# Patient Record
Sex: Female | Born: 1935
Health system: Southern US, Community
[De-identification: ages and names within clinical notes are randomized; demographics above are authoritative.]

## PROBLEM LIST (undated history)

## (undated) DIAGNOSIS — F0390 Unspecified dementia without behavioral disturbance: Secondary | ICD-10-CM

## (undated) DIAGNOSIS — I4891 Unspecified atrial fibrillation: Secondary | ICD-10-CM

## (undated) DIAGNOSIS — I272 Pulmonary hypertension, unspecified: Secondary | ICD-10-CM

## (undated) DIAGNOSIS — Z8709 Personal history of other diseases of the respiratory system: Secondary | ICD-10-CM

## (undated) DIAGNOSIS — R140 Abdominal distension (gaseous): Secondary | ICD-10-CM

## (undated) DIAGNOSIS — I050 Rheumatic mitral stenosis: Secondary | ICD-10-CM

## (undated) DIAGNOSIS — I5033 Acute on chronic diastolic (congestive) heart failure: Secondary | ICD-10-CM

## (undated) DIAGNOSIS — I10 Essential (primary) hypertension: Secondary | ICD-10-CM

## (undated) DIAGNOSIS — K219 Gastro-esophageal reflux disease without esophagitis: Secondary | ICD-10-CM

## (undated) DIAGNOSIS — K580 Irritable bowel syndrome with diarrhea: Secondary | ICD-10-CM

## (undated) DIAGNOSIS — D509 Iron deficiency anemia, unspecified: Secondary | ICD-10-CM

## (undated) DIAGNOSIS — R197 Diarrhea, unspecified: Secondary | ICD-10-CM

## (undated) DIAGNOSIS — I5032 Chronic diastolic (congestive) heart failure: Secondary | ICD-10-CM

## (undated) DIAGNOSIS — F419 Anxiety disorder, unspecified: Secondary | ICD-10-CM

## (undated) DIAGNOSIS — M26602 Left temporomandibular joint disorder, unspecified: Secondary | ICD-10-CM

## (undated) DIAGNOSIS — G8929 Other chronic pain: Secondary | ICD-10-CM

## (undated) DIAGNOSIS — M549 Dorsalgia, unspecified: Secondary | ICD-10-CM

## (undated) DIAGNOSIS — J189 Pneumonia, unspecified organism: Secondary | ICD-10-CM

## (undated) DIAGNOSIS — R1310 Dysphagia, unspecified: Secondary | ICD-10-CM

## (undated) DIAGNOSIS — M810 Age-related osteoporosis without current pathological fracture: Secondary | ICD-10-CM

## (undated) HISTORY — DX: Essential (primary) hypertension: I10

## (undated) HISTORY — PX: TONSILLECTOMY: SUR1361

## (undated) HISTORY — DX: Unspecified atrial fibrillation: I48.91

## (undated) HISTORY — DX: Left temporomandibular joint disorder, unspecified: M26.602

## (undated) HISTORY — DX: Iron deficiency anemia, unspecified: D50.9

## (undated) HISTORY — DX: Irritable bowel syndrome with diarrhea: K58.0

## (undated) HISTORY — PX: ABDOMINAL HYSTERECTOMY: SHX81

## (undated) HISTORY — PX: BACK SURGERY: SHX140

## (undated) HISTORY — DX: Unspecified dementia, unspecified severity, without behavioral disturbance, psychotic disturbance, mood disturbance, and anxiety: F03.90

## (undated) HISTORY — DX: Anxiety disorder, unspecified: F41.9

## (undated) HISTORY — DX: Personal history of other diseases of the respiratory system: Z87.09

## (undated) HISTORY — DX: Age-related osteoporosis without current pathological fracture: M81.0

## (undated) HISTORY — DX: Pneumonia, unspecified organism: J18.9

## (undated) HISTORY — DX: Dysphagia, unspecified: R13.10

---
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
## 2002-03-25 ENCOUNTER — Ambulatory Visit (HOSPITAL_COMMUNITY): Admission: RE | Admit: 2002-03-25 | Discharge: 2002-03-25 | Payer: Self-pay | Admitting: Pulmonary Disease

## 2007-05-21 ENCOUNTER — Ambulatory Visit (HOSPITAL_COMMUNITY): Admission: RE | Admit: 2007-05-21 | Discharge: 2007-05-21 | Payer: Self-pay | Admitting: Ophthalmology

## 2007-06-18 ENCOUNTER — Ambulatory Visit (HOSPITAL_COMMUNITY): Admission: RE | Admit: 2007-06-18 | Discharge: 2007-06-18 | Payer: Self-pay | Admitting: Ophthalmology

## 2008-04-07 ENCOUNTER — Ambulatory Visit (HOSPITAL_COMMUNITY): Admission: RE | Admit: 2008-04-07 | Discharge: 2008-04-07 | Payer: Self-pay | Admitting: Pulmonary Disease

## 2008-10-30 ENCOUNTER — Ambulatory Visit (HOSPITAL_COMMUNITY): Admission: RE | Admit: 2008-10-30 | Discharge: 2008-10-30 | Payer: Self-pay | Admitting: Pulmonary Disease

## 2008-11-25 ENCOUNTER — Ambulatory Visit (HOSPITAL_COMMUNITY): Admission: RE | Admit: 2008-11-25 | Discharge: 2008-11-25 | Payer: Self-pay | Admitting: Pulmonary Disease

## 2009-09-21 ENCOUNTER — Ambulatory Visit (HOSPITAL_COMMUNITY)
Admission: RE | Admit: 2009-09-21 | Discharge: 2009-09-21 | Payer: Self-pay | Source: Home / Self Care | Admitting: Pulmonary Disease

## 2010-07-12 ENCOUNTER — Ambulatory Visit (HOSPITAL_COMMUNITY)
Admission: RE | Admit: 2010-07-12 | Discharge: 2010-07-12 | Payer: Self-pay | Source: Home / Self Care | Attending: Pulmonary Disease | Admitting: Pulmonary Disease

## 2010-07-19 ENCOUNTER — Ambulatory Visit (HOSPITAL_COMMUNITY)
Admission: RE | Admit: 2010-07-19 | Discharge: 2010-07-19 | Payer: Self-pay | Source: Home / Self Care | Attending: Pulmonary Disease | Admitting: Pulmonary Disease

## 2010-08-03 ENCOUNTER — Ambulatory Visit (HOSPITAL_COMMUNITY)
Admission: RE | Admit: 2010-08-03 | Discharge: 2010-08-03 | Payer: Self-pay | Source: Home / Self Care | Attending: Pulmonary Disease | Admitting: Pulmonary Disease

## 2010-09-21 ENCOUNTER — Encounter (HOSPITAL_COMMUNITY)
Admission: RE | Admit: 2010-09-21 | Discharge: 2010-09-21 | Disposition: A | Discharge status: Home / Self Care | Payer: Medicare Other | Source: Ambulatory Visit | Attending: Neurosurgery | Admitting: Neurosurgery

## 2010-09-21 DIAGNOSIS — Z01812 Encounter for preprocedural laboratory examination: Secondary | ICD-10-CM | POA: Insufficient documentation

## 2010-09-21 LAB — SURGICAL PCR SCREEN: MRSA, PCR: NEGATIVE

## 2010-09-21 LAB — DIFFERENTIAL
Eosinophils Absolute: 0.1 10*3/uL (ref 0.0–0.7)
Eosinophils Relative: 1 % (ref 0–5)
Lymphocytes Relative: 24 % (ref 12–46)
Monocytes Absolute: 0.8 10*3/uL (ref 0.1–1.0)
Neutrophils Relative %: 68 % (ref 43–77)

## 2010-09-21 LAB — CBC
HCT: 38.4 % (ref 36.0–46.0)
Platelets: 217 10*3/uL (ref 150–400)
RBC: 4.58 MIL/uL (ref 3.87–5.11)
RDW: 13.2 % (ref 11.5–15.5)
WBC: 10.9 10*3/uL — ABNORMAL HIGH (ref 4.0–10.5)

## 2010-09-27 ENCOUNTER — Inpatient Hospital Stay (HOSPITAL_COMMUNITY): Payer: Medicare Other

## 2010-09-27 ENCOUNTER — Observation Stay (HOSPITAL_COMMUNITY)
Admission: RE | Admit: 2010-09-27 | Discharge: 2010-09-28 | Disposition: A | Discharge status: Home / Self Care | Payer: Medicare Other | Source: Ambulatory Visit | Attending: Neurosurgery | Admitting: Neurosurgery

## 2010-09-27 DIAGNOSIS — Z01812 Encounter for preprocedural laboratory examination: Secondary | ICD-10-CM | POA: Insufficient documentation

## 2010-09-27 DIAGNOSIS — K219 Gastro-esophageal reflux disease without esophagitis: Secondary | ICD-10-CM | POA: Insufficient documentation

## 2010-09-27 DIAGNOSIS — M48062 Spinal stenosis, lumbar region with neurogenic claudication: Principal | ICD-10-CM | POA: Insufficient documentation

## 2010-09-27 LAB — TYPE AND SCREEN: ABO/RH(D): B POS

## 2010-10-05 NOTE — Op Note (Signed)
  NAME:  Kristy Mckinney, Kristy Mckinney             ACCOUNT NO.:  [id number]  MEDICAL RECORD NO.:  [id number]           PATIENT TYPE:  O  LOCATION:  3528                         FACILITY:  [facility]  PHYSICIAN:  Eilene Voigt A. Cleve Paolillo, M.D.    DATE OF BIRTH:  04-18-1932  DATE OF PROCEDURE:  09/27/2010 DATE OF DISCHARGE:                              OPERATIVE REPORT   PREOPERATIVE DIAGNOSIS:  Lumbar stenosis.  POSTOPERATIVE DIAGNOSIS:  Lumbar stenosis.  PROCEDURE NOTE:  L3, L4, and L5 decompressive laminectomies with bilateral L3, L4, and L5 decompressive foraminotomies.  SURGEON:  Kathaleen Maser. Blia Totman, MD  ANESTHESIA:  General endotracheal.  INDICATION:  [name] is a 77 year old female with history of severe neurogenic claudication failing conservative management.  Workup demonstrates evidence of critical stenosis at L3-4 and L4-5.  The patient presents now for lumbar decompression in hopes of improving her symptoms.  OPERATIVE NOTE:  The patient placed on the operative table in supine position.  After an adequate level of anesthesia achieved, the patient was placed prone onto Wilson frame.  Appropriately padded the patient's lumbar region, prepped and draped sterilely.  A 10 blade was used to make a curvilinear skin incision overlying the L3, 4, 5 levels.  This was carried down sharply in midline. Subperiosteal dissection was then performed exposing the lamina and facet joints L3, L4, and L5.  Deep self-retaining retractor was placed.  Intraoperative x-rays taken, and the level was confirmed.  Decompressive laminectomy was then performed using Leksell rongeurs, Kerrison rongeurs, and high-speed drill to remove the entire lamina of L4, inferior three-quarters of lamina of L3, superior one-third of lamina of L5.  Medial facetectomies were performed bilaterally.  Ligamentum flavum was elevated and resected in usual fashion.  Decompressive foraminotomies were then performed along the course of the exiting  L3, L4, and L5 nerve roots by undercutting the lateral gutters bilaterally.  At this point, a very thorough decompression was achieved.  There was no injury to thecal sac or nerve roots.  Wound was then irrigated with antibiotic solution.  Gelfoam was placed topically for hemostasis and found be good.  A medium Hemovac drain was left in the interspace.  Wounds were then closed in layers with Vicryl suture. Steri-Strips and sterile dressing were applied.  There were no complications.  The patient tolerated the procedure well and she returned to recovery room postoperatively.          ______________________________ Kathaleen Maser [name], M.D.     HAP/MEDQ  D:  09/27/2010  T:  09/28/2010  Job:  [id number]  Electronically Signed by Julio Sicks M.D. on 10/05/2010 04:06:26 PM

## 2011-04-18 LAB — BASIC METABOLIC PANEL
BUN: 17
CO2: 29
Chloride: 103
Creatinine, Ser: 0.67

## 2011-10-02 ENCOUNTER — Encounter (HOSPITAL_COMMUNITY): Payer: Self-pay | Admitting: Pharmacy Technician

## 2011-10-10 ENCOUNTER — Encounter (HOSPITAL_COMMUNITY): Admission: RE | Admit: 2011-10-10 | Payer: Medicare Other | Source: Ambulatory Visit

## 2011-10-17 ENCOUNTER — Encounter (HOSPITAL_COMMUNITY): Admission: RE | Payer: Self-pay | Source: Ambulatory Visit

## 2011-10-17 ENCOUNTER — Ambulatory Visit (HOSPITAL_COMMUNITY): Admission: RE | Admit: 2011-10-17 | Payer: Medicare Other | Source: Ambulatory Visit | Admitting: Ophthalmology

## 2011-10-17 SURGERY — TREATMENT, USING YAG LASER
Anesthesia: LOCAL | Laterality: Right

## 2011-11-07 ENCOUNTER — Ambulatory Visit (HOSPITAL_COMMUNITY): Admission: RE | Admit: 2011-11-07 | Payer: Medicare Other | Source: Ambulatory Visit | Admitting: Ophthalmology

## 2011-11-07 SURGERY — TREATMENT, USING YAG LASER
Anesthesia: LOCAL | Laterality: Left

## 2011-11-07 NOTE — Discharge Instructions (Signed)
Kristy Mckinney  11/07/2011     Instructions    Activity: No Restrictions.   Diet: Resume Diet you were on at home.   Pain Medication: Tylenol if Needed.   CONTACT YOUR DOCTOR IF YOU HAVE PAIN, REDNESS IN YOUR EYE, OR DECREASED VISION.   Follow-up: today between 2:00- 3:00  with Loraine Leriche T. Nile Riggs, MD.   Dr. Lahoma Crocker: [phone]  Dr. Lita Mains: [phone]  Dr. Alto Denver: [phone]   If you find that you cannot contact your physician, but feel that your signs and   Symptoms warrant a physician's attention, call the Emergency Room at   [phone] [phone].

## 2011-11-07 NOTE — H&P (Signed)
The patient was re examined and there is no change in the patients condition since the original H and P. 

## 2012-01-17 ENCOUNTER — Emergency Department (HOSPITAL_COMMUNITY): Payer: Medicare Other

## 2012-01-17 ENCOUNTER — Encounter (HOSPITAL_COMMUNITY): Payer: Self-pay | Admitting: *Deleted

## 2012-01-17 ENCOUNTER — Emergency Department (HOSPITAL_COMMUNITY)
Admission: EM | Admit: 2012-01-17 | Discharge: 2012-01-17 | Disposition: A | Discharge status: Home / Self Care | Payer: Medicare Other | Attending: Emergency Medicine | Admitting: Emergency Medicine

## 2012-01-17 DIAGNOSIS — W010XXA Fall on same level from slipping, tripping and stumbling without subsequent striking against object, initial encounter: Secondary | ICD-10-CM | POA: Insufficient documentation

## 2012-01-17 DIAGNOSIS — Z79899 Other long term (current) drug therapy: Secondary | ICD-10-CM | POA: Insufficient documentation

## 2012-01-17 DIAGNOSIS — Z23 Encounter for immunization: Secondary | ICD-10-CM | POA: Insufficient documentation

## 2012-01-17 DIAGNOSIS — S62113A Displaced fracture of triquetrum [cuneiform] bone, unspecified wrist, initial encounter for closed fracture: Secondary | ICD-10-CM

## 2012-01-17 DIAGNOSIS — Y92009 Unspecified place in unspecified non-institutional (private) residence as the place of occurrence of the external cause: Secondary | ICD-10-CM | POA: Insufficient documentation

## 2012-01-17 MED ORDER — TETANUS-DIPHTH-ACELL PERTUSSIS 5-2.5-18.5 LF-MCG/0.5 IM SUSP
0.5000 mL | Freq: Once | INTRAMUSCULAR | Status: AC
  Administered 2012-01-17: 0.5 mL via INTRAMUSCULAR
  Filled 2012-01-17: qty 0.5

## 2012-01-17 NOTE — ED Notes (Signed)
Larey Seat this am, Swelling to rt wrist. Good radial pulse.

## 2012-01-17 NOTE — ED Notes (Signed)
Pt states fell this morning & tried to brace herself. Right wrist noted swelling & bruised. Pt w/ good sensation, cap refill, & radial pulse.

## 2012-01-17 NOTE — ED Notes (Signed)
Pt alert & oriented x4, stable gait. Pt given discharge instructions, paperwork & prescription(s). Patient instructed to stop at the registration desk to finish any additional paperwork. pt verbalized understanding. Pt left department w/ no further questions.  

## 2012-01-18 NOTE — ED Provider Notes (Signed)
Medical screening examination/treatment/procedure(s) were conducted as a shared visit with non-physician practitioner(s) and myself.  I personally evaluated the patient during the encounter  Mechanical fall with isolated R wrist pain.  +2 radial pulse. Cardinal hand movements intact.  Glynn Octave, MD 01/18/12 [phone]

## 2012-01-18 NOTE — ED Provider Notes (Signed)
History     CSN: [id number]  Arrival date & time 01/17/12  1845   First MD Initiated Contact with Patient 01/17/12 1902      Chief Complaint  Patient presents with  . Wrist Pain    (Consider location/radiation/quality/duration/timing/severity/associated sxs/prior treatment) HPI Comments: [name] tripped this morning,  Landing on her right outstretched hand.  She has used ice,elevation and hydrocodone (with transient relief) but continues to have aching near constant pain in her right lateral wrist.  She denies weakness or numbness distal to the injury site and also denies any other injury or pain,  Despite also sustaining abrasions on her left knee.  She denies head injury and denies loc or dizziness prior to the event.  She is right handed.    The history is provided by the patient.    History reviewed. No pertinent past medical history.  Past Surgical History  Procedure Date  . Back surgery   . Abdominal hysterectomy   . Tonsillectomy     History reviewed. No pertinent family history.  History  Substance Use Topics  . Smoking status: Never Smoker   . Smokeless tobacco: Not on file  . Alcohol Use: No    OB History    Grav Para Term Preterm Abortions TAB SAB Ect Mult Living                  Review of Systems  Musculoskeletal: Positive for joint swelling and arthralgias.  Skin: Negative for wound.  Neurological: Negative for weakness and numbness.    Allergies  Feldene  Home Medications   Current Outpatient Rx  Name Route Sig Dispense Refill  . ALPRAZOLAM 1 MG PO TABS Oral Take 1 mg by mouth at bedtime.    Marland Kitchen CALCIUM CARBONATE-VITAMIN D 500-200 MG-UNIT PO TABS Oral Take 1 tablet by mouth 2 (two) times daily.     Marland Kitchen HYDROCODONE-ACETAMINOPHEN 10-500 MG PO TABS Oral Take 1 tablet by mouth 2 (two) times daily as needed. Pain    . ADULT MULTIVITAMIN W/MINERALS CH Oral Take 1 tablet by mouth daily.    Marland Kitchen OMEPRAZOLE 20 MG PO CPDR Oral Take 20 mg by mouth  every morning.     Marland Kitchen SYSTANE OP Ophthalmic Apply 1 drop to eye daily as needed. Dry Eyes    . SERTRALINE HCL 50 MG PO TABS Oral Take 50 mg by mouth every morning.       BP 169/81  Pulse 86  Temp 98.6 F (37 C) (Oral)  Resp 20  Ht 5' 5.5" (1.664 m)  Wt 138 lb (62.596 kg)  BMI 22.61 kg/m2  SpO2 99%  Physical Exam  Constitutional: She appears well-developed and well-nourished.  HENT:  Head: Atraumatic.  Neck: Normal range of motion.  Cardiovascular:       Pulses equal bilaterally  Musculoskeletal: She exhibits tenderness.       Right hand: She exhibits bony tenderness. She exhibits normal capillary refill and no deformity. normal sensation noted.       Hands:      Pt can flex and extend right fingers,  Increased wrist pain with attempted resisted extension and flexion of 5th finger.    Neurological: She is alert. She has normal strength. She displays normal reflexes. No sensory deficit.       Equal strength  Skin: Skin is warm and dry.  Psychiatric: She has a normal mood and affect.    ED Course  Procedures (including critical care time)  Labs  Reviewed - No data to display Dg Wrist Complete Right  01/17/2012  *RADIOLOGY REPORT*  Clinical Data: Distal right radius pain and swelling secondary to a fall this morning.  RIGHT WRIST - COMPLETE 3+ VIEW  Comparison: None.  Findings: There is a fracture of the dorsal aspect of the triquetrum with adjacent soft tissue swelling.  No other bones of the wrist are intact.  IMPRESSION: Fracture of the triquetrum.  Original Report Authenticated By: Gwynn Burly, M.D.     1. Triquetral fracture    Pt placed in ulner gutter splint,  Sling by RN.  Examined by me post application.  Pt comfortable,  Less than 2 sec cap refill present.   MDM  Pt with triquetral fx,  Has seen Dr. Hilda Lias in the past,  Advised to call in am for appt for recheck of her injury,  And full cast application this week.  Encouraged ice,  Elevation,  Continue with  hydrocodone prn - pt has prescription.   Pt was seen by Dr. Manus Gunning prior to dc home.  xrays reviewed,  And also shown to pt.        [name], [place] 01/18/12 1438

## 2013-12-15 ENCOUNTER — Other Ambulatory Visit (HOSPITAL_COMMUNITY): Payer: Self-pay | Admitting: Pulmonary Disease

## 2013-12-15 DIAGNOSIS — R109 Unspecified abdominal pain: Secondary | ICD-10-CM

## 2013-12-16 ENCOUNTER — Other Ambulatory Visit (HOSPITAL_COMMUNITY): Payer: Self-pay | Admitting: Pulmonary Disease

## 2013-12-16 ENCOUNTER — Ambulatory Visit (HOSPITAL_COMMUNITY)
Admission: RE | Admit: 2013-12-16 | Discharge: 2013-12-16 | Disposition: A | Discharge status: Home / Self Care | Payer: Medicare HMO | Source: Ambulatory Visit | Attending: Pulmonary Disease | Admitting: Pulmonary Disease

## 2013-12-16 DIAGNOSIS — R109 Unspecified abdominal pain: Secondary | ICD-10-CM

## 2013-12-16 DIAGNOSIS — M545 Low back pain, unspecified: Secondary | ICD-10-CM

## 2013-12-16 DIAGNOSIS — Z4789 Encounter for other orthopedic aftercare: Secondary | ICD-10-CM | POA: Insufficient documentation

## 2013-12-16 DIAGNOSIS — K7689 Other specified diseases of liver: Secondary | ICD-10-CM | POA: Insufficient documentation

## 2013-12-16 DIAGNOSIS — M1610 Unilateral primary osteoarthritis, unspecified hip: Secondary | ICD-10-CM | POA: Insufficient documentation

## 2013-12-16 DIAGNOSIS — M169 Osteoarthritis of hip, unspecified: Secondary | ICD-10-CM | POA: Insufficient documentation

## 2013-12-16 DIAGNOSIS — M25559 Pain in unspecified hip: Secondary | ICD-10-CM

## 2013-12-16 DIAGNOSIS — I7090 Unspecified atherosclerosis: Secondary | ICD-10-CM | POA: Insufficient documentation

## 2013-12-16 DIAGNOSIS — M4120 Other idiopathic scoliosis, site unspecified: Secondary | ICD-10-CM | POA: Insufficient documentation

## 2013-12-16 DIAGNOSIS — K8020 Calculus of gallbladder without cholecystitis without obstruction: Secondary | ICD-10-CM | POA: Insufficient documentation

## 2013-12-16 DIAGNOSIS — M47817 Spondylosis without myelopathy or radiculopathy, lumbosacral region: Secondary | ICD-10-CM | POA: Insufficient documentation

## 2014-01-15 ENCOUNTER — Ambulatory Visit (HOSPITAL_COMMUNITY)
Admission: RE | Admit: 2014-01-15 | Discharge: 2014-01-15 | Disposition: A | Discharge status: Home / Self Care | Payer: Medicare HMO | Source: Ambulatory Visit | Attending: Pulmonary Disease | Admitting: Pulmonary Disease

## 2014-01-15 ENCOUNTER — Other Ambulatory Visit (HOSPITAL_COMMUNITY): Payer: Self-pay | Admitting: Pulmonary Disease

## 2014-01-15 DIAGNOSIS — M25531 Pain in right wrist: Secondary | ICD-10-CM

## 2014-01-15 DIAGNOSIS — S62309A Unspecified fracture of unspecified metacarpal bone, initial encounter for closed fracture: Secondary | ICD-10-CM | POA: Insufficient documentation

## 2014-01-15 DIAGNOSIS — M25539 Pain in unspecified wrist: Secondary | ICD-10-CM | POA: Insufficient documentation

## 2014-01-15 DIAGNOSIS — W19XXXA Unspecified fall, initial encounter: Secondary | ICD-10-CM

## 2014-01-22 NOTE — Patient Instructions (Signed)
Kristy Mckinney  01/22/2014   Your procedure is scheduled on:  01/29/14  Report to [facility] at [facility].  Call this number if you have problems the morning of surgery: [phone]   Remember:   Do not eat food or drink liquids after midnight.   Take these medicines the morning of surgery with A SIP OF WATER: xanax, pain pill, prilosec, zoloft   Do not wear jewelry, make-up or nail polish.  Do not wear lotions, powders, or perfumes. You may wear deodorant.  Do not shave 48 hours prior to surgery. Men may shave face and neck.  Do not bring valuables to the hospital.  [facility] is not responsible                  for any belongings or valuables.               Contacts, dentures or bridgework may not be worn into surgery.  Leave suitcase in the car. After surgery it may be brought to your room.  For patients admitted to the hospital, discharge time is determined by your                treatment team.               Patients discharged the day of surgery will not be allowed to drive  home.  Name and phone number of your driver: family  Special Instructions: Shower using CHG 2 nights before surgery and the night before surgery.  If you shower the day of surgery use CHG.  Use special wash - you have one bottle of CHG for all showers.  You should use approximately 1/3 of the bottle for each shower.   Please read over the following fact sheets that you were given: Anesthesia Post-op Instructions and Care and Recovery After Surgery   PATIENT INSTRUCTIONS POST-ANESTHESIA  IMMEDIATELY FOLLOWING SURGERY:  Do not drive or operate machinery for the first twenty four hours after surgery.  Do not make any important decisions for twenty four hours after surgery or while taking narcotic pain medications or sedatives.  If you develop intractable nausea and vomiting or a severe headache please notify your doctor immediately.  FOLLOW-UP:  Please make an appointment with your surgeon as instructed. You  do not need to follow up with anesthesia unless specifically instructed to do so.  WOUND CARE INSTRUCTIONS (if applicable):  Keep a dry clean dressing on the anesthesia/puncture wound site if there is drainage.  Once the wound has quit draining you may leave it open to air.  Generally you should leave the bandage intact for twenty four hours unless there is drainage.  If the epidural site drains for more than 36-48 hours please call the anesthesia department.  QUESTIONS?:  Please feel free to call your physician or the hospital operator if you have any questions, and they will be happy to assist you.      Laparoscopic Cholecystectomy Laparoscopic cholecystectomy is surgery to remove the gallbladder. The gallbladder is located in the upper right part of the abdomen, behind the liver. It is a storage sac for bile produced in the liver. Bile aids in the digestion and absorption of fats. Cholecystectomy is often done for inflammation of the gallbladder (cholecystitis). This condition is usually caused by a buildup of gallstones (cholelithiasis) in your gallbladder. Gallstones can block the flow of bile, resulting in inflammation and pain. In severe cases, emergency surgery may be required. When emergency  surgery is not required, you will have time to prepare for the procedure. Laparoscopic surgery is an alternative to open surgery. Laparoscopic surgery has a shorter recovery time. Your common bile duct may also need to be examined during the procedure. If stones are found in the common bile duct, they may be removed. LET [facility] CARE PROVIDER KNOW ABOUT:  Any allergies you have.  All medicines you are taking, including vitamins, herbs, eye drops, creams, and over-the-counter medicines.  Previous problems you or members of your family have had with the use of anesthetics.  Any blood disorders you have.  Previous surgeries you have had.  Medical conditions you have. RISKS AND  COMPLICATIONS Generally, this is a safe procedure. However, as with any procedure, complications can occur. Possible complications include:  Infection.  Damage to the common bile duct, nerves, arteries, veins, or other internal organs such as the stomach, liver, or intestines.  Bleeding.  A stone may remain in the common bile duct.  A bile leak from the cyst duct that is clipped when your gallbladder is removed.  The need to convert to open surgery, which requires a larger incision in the abdomen. This may be necessary if your surgeon thinks it is not safe to continue with a laparoscopic procedure. BEFORE THE PROCEDURE  Ask your health care provider about changing or stopping any regular medicines. You will need to stop taking aspirin or blood thinners at least 5 days prior to surgery.  Do not eat or drink anything after midnight the night before surgery.  Let your health care provider know if you develop a cold or other infectious problem before surgery. PROCEDURE   You will be given medicine to make you sleep through the procedure (general anesthetic). A breathing tube will be placed in your mouth.  When you are asleep, your surgeon will make several small cuts (incisions) in your abdomen.  A thin, lighted tube with a tiny camera on the end (laparoscope) is inserted through one of the small incisions. The camera on the laparoscope sends a picture to a TV screen in the operating room. This gives the surgeon a good view inside your abdomen.  A gas will be pumped into your abdomen. This expands your abdomen so that the surgeon has more room to perform the surgery.  Other tools needed for the procedure are inserted through the other incisions. The gallbladder is removed through one of the incisions.  After the removal of your gallbladder, the incisions will be closed with stitches, staples, or skin glue. AFTER THE PROCEDURE  You will be taken to a recovery area where your progress  will be checked often.  You may be allowed to go home the same day if your pain is controlled and you can tolerate liquids. Document Released: 06/26/2005 Document Revised: 04/16/2013 Document Reviewed: 02/05/2013 [facility] Patient Information 2015 [place], [place]. This information is not intended to replace advice given to you by your health care provider. Make sure you discuss any questions you have with your health care provider.

## 2014-01-23 ENCOUNTER — Other Ambulatory Visit: Payer: Self-pay

## 2014-01-23 ENCOUNTER — Encounter (HOSPITAL_COMMUNITY): Payer: Self-pay

## 2014-01-23 ENCOUNTER — Encounter (HOSPITAL_COMMUNITY)
Admission: RE | Admit: 2014-01-23 | Discharge: 2014-01-23 | Disposition: A | Discharge status: Home / Self Care | Payer: Medicare HMO | Source: Ambulatory Visit | Attending: General Surgery | Admitting: General Surgery

## 2014-01-23 DIAGNOSIS — Z01812 Encounter for preprocedural laboratory examination: Secondary | ICD-10-CM | POA: Insufficient documentation

## 2014-01-23 DIAGNOSIS — Z01810 Encounter for preprocedural cardiovascular examination: Secondary | ICD-10-CM | POA: Diagnosis present

## 2014-01-23 LAB — CBC WITH DIFFERENTIAL/PLATELET
Basophils Absolute: 0 10*3/uL (ref 0.0–0.1)
Basophils Relative: 1 % (ref 0–1)
EOS ABS: 0.1 10*3/uL (ref 0.0–0.7)
EOS PCT: 2 % (ref 0–5)
HCT: 34.8 % — ABNORMAL LOW (ref 36.0–46.0)
HEMOGLOBIN: 11.5 g/dL — AB (ref 12.0–15.0)
LYMPHS ABS: 1.2 10*3/uL (ref 0.7–4.0)
LYMPHS PCT: 28 % (ref 12–46)
MCH: 27.7 pg (ref 26.0–34.0)
MCHC: 33 g/dL (ref 30.0–36.0)
MCV: 83.9 fL (ref 78.0–100.0)
MONOS PCT: 9 % (ref 3–12)
Monocytes Absolute: 0.4 10*3/uL (ref 0.1–1.0)
Neutro Abs: 2.6 10*3/uL (ref 1.7–7.7)
Neutrophils Relative %: 60 % (ref 43–77)
Platelets: 206 10*3/uL (ref 150–400)
RBC: 4.15 MIL/uL (ref 3.87–5.11)
RDW: 14.7 % (ref 11.5–15.5)
WBC: 4.3 10*3/uL (ref 4.0–10.5)

## 2014-01-23 LAB — HEPATIC FUNCTION PANEL
ALBUMIN: 3.8 g/dL (ref 3.5–5.2)
ALT: 9 U/L (ref 0–35)
AST: 13 U/L (ref 0–37)
Alkaline Phosphatase: 54 U/L (ref 39–117)
Total Bilirubin: 0.8 mg/dL (ref 0.3–1.2)
Total Protein: 6.5 g/dL (ref 6.0–8.3)

## 2014-01-23 LAB — BASIC METABOLIC PANEL
Anion gap: 10 (ref 5–15)
BUN: 15 mg/dL (ref 6–23)
CHLORIDE: 101 meq/L (ref 96–112)
CO2: 29 meq/L (ref 19–32)
Calcium: 9.5 mg/dL (ref 8.4–10.5)
Creatinine, Ser: 0.74 mg/dL (ref 0.50–1.10)
GFR calc Af Amer: >90 mL/min (ref >90)
GFR calc non Af Amer: 79 mL/min — ABNORMAL LOW (ref >90)
Glucose, Bld: 92 mg/dL (ref 70–99)
Potassium: 4.3 mEq/L (ref 3.7–5.3)
Sodium: 140 mEq/L (ref 137–147)

## 2014-01-23 LAB — AMYLASE: Amylase: 27 U/L (ref 0–105)

## 2014-01-23 NOTE — Consult Note (Signed)
NAME:  KAMIYA, ACORD NO.:  [id number]  MEDICAL RECORD NO.:  [id number]  LOCATION:                                 FACILITY:  PHYSICIAN:  Felicie Morn, M.D. DATE OF BIRTH:  [birth year]  DATE OF CONSULTATION:  01/21/2014 DATE OF DISCHARGE:                                CONSULTATION   NOTE:  Surgery was asked to see this 80 year old white female for cholecystitis, cholelithiasis.  HISTORY OF PRESENT MEDICAL ILLNESS:  The patient states that she has had more or less than 3 year history of recurrent right upper quadrant pain that was postprandial in nature and radiated to her back and was worse over the last month or so.  This was accompanied frequently with nausea, but without vomiting.  She was treated for GERD; however, she continued to have the symptoms.  Recent sonogram shown her to have multiple stones within the gallbladder.  We discussed the need for surgery with her, discussing complications, not limited to, but including bleeding, infection, damage to bile ducts, perforation of organs, transitory diarrhea, and the possibility that open surgery might be required. Informed consent was obtained.  PAST MEDICAL HISTORY:  Fairly unremarkable.  She is very healthy 80 year old with no cardiorespiratory symptoms.  She has a history of GERD, but this in fact may be more related to her gallbladder problems.  PAST SURGICAL HISTORY:  Past surgeries have included tonsillectomy and adenoidectomy in childhood, a vaginal hysterectomy in 1967, and lumbosacral surgery in 2012.  MEDICATIONS:  See medication list.  ALLERGIES:  She is allergic to PENICILLIN.  SOCIAL HISTORY:  She is a nonsmoker and nondrinker.  PHYSICAL EXAMINATION:  VITAL SIGNS:  She is 5 feet and 5-1/2 inches tall, weighs 150 pounds.  Temperature is 99.6, pulse is 96 and regular, respirations 12, blood pressure 136/68. HEENT:  Head is normocephalic.  Eyes, extraocular movements are  intact. Pupils are equal, round, reactive to light and accommodation.  She has no bruits, appreciated no adenopathy, no thyromegaly and no jugular vein distention. CHEST:  Clear both the anterior and posterior auscultation. HEART:  Regular rhythm.  Breasts and axillae are without masses. ABDOMEN:  Soft, no rebound tenderness.  No hernias are appreciated. EXTREMITIES:  Within normal limits.  She has some slight varicosities bilaterally.  REVIEW OF SYSTEMS:  NEUROLOGIC:  No history of migraines or seizures or any lateralizing neurological findings.  ENDOCRINE:  No history of diabetes, thyroid disease or adrenal problems.  CARDIOPULMONARY:  Within normal limits.  MUSCULOSKELETAL:  The patient has had a recent hairline fracture of her right arm, which was treated by an immobilizing device only.  She is fractured her hand in the past and she has had lumbosacral spine surgery as mentioned above and she clinically has kyphosis. OB/GYN:  Her last menstrual period was in 1967.  She had a vaginal hysterectomy at that time.  She is a gravida 4, para 3, abortus 1, cesarean 0 female with no family history of carcinoma of the breast and she has not had a mammogram.  GI:  History of reflux, but as stated this may be gallbladder disease.  She has had no past history of hepatitis, constipation.  She has had some loose stools.  No past history of bright red rectal bleeding, black tarry stools, history of inflammatory bowel disease or irritable bowel syndrome.  She has had no unexplained weight loss and she does not wish to have a colonoscopy, which she has never had.  GU:  No history of frequency, dysuria, or kidney stones.  REVIEW OF HISTORY AND PHYSICAL:  Therefore, Ms. Massey is a 80 year old white female who looks younger than her age and is in good health who has had symptoms suggested of gallbladder disease, which is confirmed on sonogram.  We will obtain appropriate liver function studies and  blood work preoperatively.  She has been placed on a restrictive diet, and as she has recently been seen by Dr. Luan Pulling, we will go ahead without any further medical workup and plan for a laparoscopic cholecystectomy via the outpatient department.  We will obtain of course an EKG and a chest x-ray appropriate for her age and she is told to contact us should she develop any other symptoms prior to surgery.     Felicie Morn, M.D.     WB/MEDQ  D:  01/21/2014  T:  01/22/2014  Job:  [id number]  cc:   Percell Miller L. Luan Pulling, M.D. Fax: [phone]

## 2014-01-26 ENCOUNTER — Encounter (HOSPITAL_COMMUNITY): Payer: Self-pay | Admitting: Pharmacy Technician

## 2014-01-29 ENCOUNTER — Encounter (HOSPITAL_COMMUNITY): Payer: Medicare HMO | Admitting: Anesthesiology

## 2014-01-29 ENCOUNTER — Ambulatory Visit (HOSPITAL_COMMUNITY): Payer: Medicare HMO | Admitting: Anesthesiology

## 2014-01-29 ENCOUNTER — Observation Stay (HOSPITAL_COMMUNITY)
Admission: RE | Admit: 2014-01-29 | Discharge: 2014-01-30 | Disposition: A | Discharge status: Home / Self Care | Payer: Medicare HMO | Source: Ambulatory Visit | Attending: General Surgery | Admitting: General Surgery

## 2014-01-29 ENCOUNTER — Encounter (HOSPITAL_COMMUNITY)
Admission: RE | Disposition: A | Discharge status: Home / Self Care | Payer: Self-pay | Source: Ambulatory Visit | Attending: General Surgery

## 2014-01-29 ENCOUNTER — Encounter (HOSPITAL_COMMUNITY): Payer: Self-pay | Admitting: *Deleted

## 2014-01-29 ENCOUNTER — Ambulatory Visit (HOSPITAL_COMMUNITY): Payer: Medicare HMO

## 2014-01-29 DIAGNOSIS — K8010 Calculus of gallbladder with chronic cholecystitis without obstruction: Secondary | ICD-10-CM | POA: Diagnosis present

## 2014-01-29 DIAGNOSIS — K219 Gastro-esophageal reflux disease without esophagitis: Secondary | ICD-10-CM | POA: Diagnosis not present

## 2014-01-29 DIAGNOSIS — Z880 Allergy status to penicillin: Secondary | ICD-10-CM | POA: Insufficient documentation

## 2014-01-29 HISTORY — DX: Gastro-esophageal reflux disease without esophagitis: K21.9

## 2014-01-29 HISTORY — DX: Dorsalgia, unspecified: M54.9

## 2014-01-29 HISTORY — PX: CHOLECYSTECTOMY: SHX55

## 2014-01-29 HISTORY — DX: Other chronic pain: G89.29

## 2014-01-29 SURGERY — LAPAROSCOPIC CHOLECYSTECTOMY
Anesthesia: General | Site: Abdomen

## 2014-01-29 MED ORDER — LACTATED RINGERS IV SOLN
INTRAVENOUS | Status: DC
Start: 2014-01-29 — End: 2014-01-29
  Administered 2014-01-29 (×2): via INTRAVENOUS

## 2014-01-29 MED ORDER — MIDAZOLAM HCL 5 MG/5ML IJ SOLN
INTRAMUSCULAR | Status: DC | PRN
  Administered 2014-01-29: 2 mg via INTRAVENOUS

## 2014-01-29 MED ORDER — GLYCOPYRROLATE 0.2 MG/ML IJ SOLN
INTRAMUSCULAR | Status: AC
Start: 2014-01-29 — End: 2014-01-29
  Filled 2014-01-29: qty 1

## 2014-01-29 MED ORDER — ONDANSETRON HCL 4 MG/2ML IJ SOLN
INTRAMUSCULAR | Status: AC
  Filled 2014-01-29: qty 2

## 2014-01-29 MED ORDER — LIDOCAINE HCL (PF) 1 % IJ SOLN
INTRAMUSCULAR | Status: AC
Start: 2014-01-29 — End: 2014-01-29
  Filled 2014-01-29: qty 5

## 2014-01-29 MED ORDER — MIDAZOLAM HCL 2 MG/2ML IJ SOLN
INTRAMUSCULAR | Status: AC
  Filled 2014-01-29: qty 2

## 2014-01-29 MED ORDER — PANTOPRAZOLE SODIUM 40 MG PO TBEC
40.0000 mg | DELAYED_RELEASE_TABLET | Freq: Every day | ORAL | Status: DC
  Administered 2014-01-29 – 2014-01-30 (×2): 40 mg via ORAL
  Filled 2014-01-29 (×2): qty 1

## 2014-01-29 MED ORDER — MORPHINE SULFATE 2 MG/ML IJ SOLN
1.0000 mg | INTRAMUSCULAR | Status: DC | PRN
  Administered 2014-01-29 – 2014-01-30 (×4): 1 mg via INTRAVENOUS
  Filled 2014-01-29 (×4): qty 1

## 2014-01-29 MED ORDER — LIDOCAINE HCL 1 % IJ SOLN
INTRAMUSCULAR | Status: DC | PRN
  Administered 2014-01-29: 40 mg via INTRADERMAL

## 2014-01-29 MED ORDER — GLYCOPYRROLATE 0.2 MG/ML IJ SOLN
0.2000 mg | Freq: Once | INTRAMUSCULAR | Status: AC
  Administered 2014-01-29: 0.2 mg via INTRAVENOUS

## 2014-01-29 MED ORDER — BUPIVACAINE HCL (PF) 0.5 % IJ SOLN
INTRAMUSCULAR | Status: AC
  Filled 2014-01-29: qty 30

## 2014-01-29 MED ORDER — NEOSTIGMINE METHYLSULFATE 10 MG/10ML IV SOLN
INTRAVENOUS | Status: DC | PRN
  Administered 2014-01-29: 2 mg via INTRAVENOUS
  Administered 2014-01-29: 1 mg via INTRAVENOUS

## 2014-01-29 MED ORDER — PROPOFOL 10 MG/ML IV BOLUS
INTRAVENOUS | Status: DC | PRN
  Administered 2014-01-29: 120 mg via INTRAVENOUS

## 2014-01-29 MED ORDER — ONDANSETRON HCL 4 MG/2ML IJ SOLN
4.0000 mg | Freq: Once | INTRAMUSCULAR | Status: AC | PRN
  Administered 2014-01-29: 4 mg via INTRAVENOUS
  Filled 2014-01-29: qty 2

## 2014-01-29 MED ORDER — SODIUM CHLORIDE 0.9 % IR SOLN
Status: DC | PRN
  Administered 2014-01-29: 3000 mL

## 2014-01-29 MED ORDER — CIPROFLOXACIN IN D5W 200 MG/100ML IV SOLN
INTRAVENOUS | Status: AC
  Filled 2014-01-29: qty 100

## 2014-01-29 MED ORDER — FENTANYL CITRATE 0.05 MG/ML IJ SOLN: 25.0000 ug | INTRAMUSCULAR | Status: DC | PRN

## 2014-01-29 MED ORDER — DEXTROSE 5 % IV SOLN
INTRAVENOUS | Status: DC | PRN
  Administered 2014-01-29: 07:00:00 via INTRAVENOUS

## 2014-01-29 MED ORDER — WATER FOR IRRIGATION, STERILE IR SOLN
Status: DC | PRN
  Administered 2014-01-29: 2000 mL

## 2014-01-29 MED ORDER — MIDAZOLAM HCL 2 MG/2ML IJ SOLN
1.0000 mg | INTRAMUSCULAR | Status: DC | PRN
  Administered 2014-01-29: 2 mg via INTRAVENOUS

## 2014-01-29 MED ORDER — DEXAMETHASONE SODIUM PHOSPHATE 4 MG/ML IJ SOLN
4.0000 mg | Freq: Once | INTRAMUSCULAR | Status: AC
  Administered 2014-01-29: 4 mg via INTRAVENOUS
  Filled 2014-01-29: qty 1

## 2014-01-29 MED ORDER — POLYVINYL ALCOHOL 1.4 % OP SOLN
1.0000 [drp] | Freq: Two times a day (BID) | OPHTHALMIC | Status: DC
  Administered 2014-01-29 – 2014-01-30 (×2): 1 [drp] via OPHTHALMIC
  Filled 2014-01-29 (×2): qty 15

## 2014-01-29 MED ORDER — FENTANYL CITRATE 0.05 MG/ML IJ SOLN
INTRAMUSCULAR | Status: AC
  Filled 2014-01-29: qty 5

## 2014-01-29 MED ORDER — CIPROFLOXACIN IN D5W 200 MG/100ML IV SOLN
200.0000 mg | Freq: Once | INTRAVENOUS | Status: DC
  Filled 2014-01-29: qty 100

## 2014-01-29 MED ORDER — POTASSIUM CHLORIDE IN NACL 20-0.9 MEQ/L-% IV SOLN
INTRAVENOUS | Status: DC
  Administered 2014-01-29: 12:00:00 via INTRAVENOUS

## 2014-01-29 MED ORDER — ONDANSETRON HCL 4 MG/2ML IJ SOLN
4.0000 mg | Freq: Once | INTRAMUSCULAR | Status: AC
  Administered 2014-01-29: 4 mg via INTRAVENOUS

## 2014-01-29 MED ORDER — GLYCOPYRROLATE 0.2 MG/ML IJ SOLN
INTRAMUSCULAR | Status: AC
  Filled 2014-01-29: qty 1

## 2014-01-29 MED ORDER — ROCURONIUM BROMIDE 50 MG/5ML IV SOLN
INTRAVENOUS | Status: AC
  Filled 2014-01-29: qty 1

## 2014-01-29 MED ORDER — ONDANSETRON HCL 4 MG/2ML IJ SOLN: 4.0000 mg | Freq: Four times a day (QID) | INTRAMUSCULAR | Status: DC | PRN

## 2014-01-29 MED ORDER — GLYCOPYRROLATE 0.2 MG/ML IJ SOLN
INTRAMUSCULAR | Status: DC | PRN
  Administered 2014-01-29: 0.2 mg via INTRAVENOUS

## 2014-01-29 MED ORDER — ALPRAZOLAM 1 MG PO TABS
1.0000 mg | ORAL_TABLET | Freq: Every day | ORAL | Status: DC
  Administered 2014-01-29: 1 mg via ORAL
  Filled 2014-01-29: qty 1

## 2014-01-29 MED ORDER — SERTRALINE HCL 50 MG PO TABS
50.0000 mg | ORAL_TABLET | Freq: Every morning | ORAL | Status: DC
  Administered 2014-01-29 – 2014-01-30 (×2): 50 mg via ORAL
  Filled 2014-01-29 (×2): qty 1

## 2014-01-29 MED ORDER — CIPROFLOXACIN IN D5W 400 MG/200ML IV SOLN
INTRAVENOUS | Status: DC | PRN
  Administered 2014-01-29: 200 mg via INTRAVENOUS

## 2014-01-29 MED ORDER — PROPOFOL 10 MG/ML IV BOLUS
INTRAVENOUS | Status: AC
  Filled 2014-01-29: qty 20

## 2014-01-29 MED ORDER — ONDANSETRON HCL 4 MG PO TABS: 4.0000 mg | ORAL_TABLET | Freq: Four times a day (QID) | ORAL | Status: DC | PRN

## 2014-01-29 MED ORDER — POLYETHYL GLYCOL-PROPYL GLYCOL 0.4-0.3 % OP SOLN: Freq: Two times a day (BID) | OPHTHALMIC | Status: DC

## 2014-01-29 MED ORDER — SODIUM CHLORIDE 0.9 % IR SOLN
Status: DC | PRN
  Administered 2014-01-29: 1000 mL

## 2014-01-29 MED ORDER — BUPIVACAINE HCL (PF) 0.5 % IJ SOLN
INTRAMUSCULAR | Status: DC | PRN
  Administered 2014-01-29: 7 mL

## 2014-01-29 MED ORDER — BIOTENE DRY MOUTH MT LIQD
15.0000 mL | Freq: Two times a day (BID) | OROMUCOSAL | Status: DC
  Administered 2014-01-30: 15 mL via OROMUCOSAL

## 2014-01-29 MED ORDER — FENTANYL CITRATE 0.05 MG/ML IJ SOLN
INTRAMUSCULAR | Status: DC | PRN
  Administered 2014-01-29 (×5): 50 ug via INTRAVENOUS

## 2014-01-29 MED ORDER — ROCURONIUM BROMIDE 100 MG/10ML IV SOLN
INTRAVENOUS | Status: DC | PRN
  Administered 2014-01-29: 35 mg via INTRAVENOUS

## 2014-01-29 SURGICAL SUPPLY — 53 items
APPLICATOR COTTON TIP 6IN STRL (MISCELLANEOUS) ×2 IMPLANT
APPLIER CLIP LAPSCP 10X32 DD (CLIP) ×2 IMPLANT
BAG HAMPER (MISCELLANEOUS) ×2 IMPLANT
BLADE 15 SAFETY STRL DISP (BLADE) ×2 IMPLANT
CLOTH BEACON ORANGE TIMEOUT ST (SAFETY) ×2 IMPLANT
COVER LIGHT HANDLE STERIS (MISCELLANEOUS) ×4 IMPLANT
DECANTER SPIKE VIAL GLASS SM (MISCELLANEOUS) ×2 IMPLANT
DISSECTOR BLUNT TIP ENDO 5MM (MISCELLANEOUS) ×2 IMPLANT
DRSG TEGADERM 2-3/8X2-3/4 SM (GAUZE/BANDAGES/DRESSINGS) ×8 IMPLANT
DURAPREP 26ML APPLICATOR (WOUND CARE) ×2 IMPLANT
ELECT REM PT RETURN 9FT ADLT (ELECTROSURGICAL) ×2
ELECTRODE REM PT RTRN 9FT ADLT (ELECTROSURGICAL) ×1 IMPLANT
EVACUATOR DRAINAGE 10X20 100CC (DRAIN) ×1 IMPLANT
EVACUATOR SILICONE 100CC (DRAIN) ×1
FILTER SMOKE EVAC LAPAROSHD (FILTER) ×2 IMPLANT
FORMALIN 10 PREFIL 120ML (MISCELLANEOUS) ×2 IMPLANT
GAUZE SPONGE 4X4 12PLY STRL (GAUZE/BANDAGES/DRESSINGS) ×2 IMPLANT
GLOVE BIOGEL PI IND STRL 7.0 (GLOVE) ×1 IMPLANT
GLOVE BIOGEL PI IND STRL 7.5 (GLOVE) ×1 IMPLANT
GLOVE BIOGEL PI INDICATOR 7.0 (GLOVE) ×1
GLOVE BIOGEL PI INDICATOR 7.5 (GLOVE) ×1
GLOVE ECLIPSE 6.5 STRL STRAW (GLOVE) ×2 IMPLANT
GLOVE ECLIPSE 7.0 STRL STRAW (GLOVE) ×2 IMPLANT
GLOVE SKINSENSE NS SZ7.0 (GLOVE) ×1
GLOVE SKINSENSE STRL SZ7.0 (GLOVE) ×1 IMPLANT
GOWN STRL REUS W/TWL LRG LVL3 (GOWN DISPOSABLE) ×6 IMPLANT
HEMOSTAT SURGICEL 4X8 (HEMOSTASIS) ×2 IMPLANT
INST SET LAPROSCOPIC AP (KITS) ×2 IMPLANT
IV NS IRRIG 3000ML ARTHROMATIC (IV SOLUTION) ×2 IMPLANT
KIT ROOM TURNOVER APOR (KITS) ×2 IMPLANT
MANIFOLD NEPTUNE II (INSTRUMENTS) ×2 IMPLANT
NEEDLE INSUFFLATION 14GA 120MM (NEEDLE) ×2 IMPLANT
NS IRRIG 1000ML POUR BTL (IV SOLUTION) ×2 IMPLANT
PACK LAP CHOLE LZT030E (CUSTOM PROCEDURE TRAY) ×2 IMPLANT
PAD ARMBOARD 7.5X6 YLW CONV (MISCELLANEOUS) ×2 IMPLANT
POUCH SPECIMEN RETRIEVAL 10MM (ENDOMECHANICALS) ×2 IMPLANT
SET BASIN LINEN APH (SET/KITS/TRAYS/PACK) ×2 IMPLANT
SET TUBE IRRIG SUCTION NO TIP (IRRIGATION / IRRIGATOR) ×2 IMPLANT
SLEEVE ENDOPATH XCEL 5M (ENDOMECHANICALS) ×2 IMPLANT
SPONGE DRAIN TRACH 4X4 STRL 2S (GAUZE/BANDAGES/DRESSINGS) ×2 IMPLANT
SPONGE GAUZE 4X4 12PLY (GAUZE/BANDAGES/DRESSINGS) ×2 IMPLANT
SPONGE LAP 18X18 X RAY DECT (DISPOSABLE) ×2 IMPLANT
STAPLER VISISTAT 35W (STAPLE) ×2 IMPLANT
SUT ETHILON 3 0 FSL (SUTURE) ×2 IMPLANT
SUT VICRYL 0 UR6 27IN ABS (SUTURE) ×2 IMPLANT
TOWEL OR 17X26 4PK STRL BLUE (TOWEL DISPOSABLE) ×2 IMPLANT
TRAY FOLEY CATH 16FR SILVER (SET/KITS/TRAYS/PACK) ×2 IMPLANT
TROCAR ENDO BLADELESS 11MM (ENDOMECHANICALS) ×2 IMPLANT
TROCAR XCEL NON-BLD 5MMX100MML (ENDOMECHANICALS) ×2 IMPLANT
TROCAR XCEL UNIV SLVE 11M 100M (ENDOMECHANICALS) ×2 IMPLANT
TUBING INSUFFLATION HIGH FLOW (TUBING) ×2 IMPLANT
WARMER LAPAROSCOPE (MISCELLANEOUS) ×2 IMPLANT
WATER STERILE IRR 1000ML POUR (IV SOLUTION) ×4 IMPLANT

## 2014-01-29 NOTE — Transfer of Care (Signed)
Immediate Anesthesia Transfer of Care Note  Patient: Kristy Mckinney  Procedure(s) Performed: Procedure(s): LAPAROSCOPIC CHOLECYSTECTOMY (N/A)  Patient Location: PACU  Anesthesia Type:General  Level of Consciousness: sedated  Airway & Oxygen Therapy: Patient Spontanous Breathing and Patient connected to face mask oxygen  Post-op Assessment: Report given to PACU RN, Post -op Vital signs reviewed and stable and Patient moving all extremities  Post vital signs: Reviewed and stable  Complications: No apparent anesthesia complications

## 2014-01-29 NOTE — Progress Notes (Signed)
Late entry: when pt arrived to Dept 300, she stated that she needed to urinate. Pt was placed on bedpan and she was unable to void. Bladder scan showed >300cc. Dr. Romona Curls made aware. Order to get pt up to [facility] and turn on water to help her void, if unable to at that time, then I&O cath. NT's helped pt up to [facility] and she was able to void.

## 2014-01-29 NOTE — Progress Notes (Signed)
36 yr. Old W. Female for elective cholecystectomy for recurrent episodes of cholecystitis secondary to cholelithiasis.  Procedure and risks discussed and informed consent obtained.   Labs reviewed.  Abdomen less tender and pt has been less symptomatic as she has adhered to restrictive diet pre op. H&P signed.

## 2014-01-29 NOTE — Progress Notes (Signed)
Post OP Check  Filed Vitals:   01/29/14 0945  BP: 187/108  Pulse: 101  Temp:   Resp: 17  O2 sat 100% on ventimask 40%  Pt had  2 episodes of emesis no obvious aspiration seen on CXR and no drop in O2 sat.  Will keep pt positioned on her side and with nasal canula. I have instructed the nurses to have pt on telemetry and O2 sat monitoring.

## 2014-01-29 NOTE — Anesthesia Procedure Notes (Signed)
Procedure Name: Intubation Date/Time: 01/29/2014 7:39 AM Performed by: Charmaine Downs Pre-anesthesia Checklist: Emergency Drugs available, Suction available, Patient being monitored and Patient identified Patient Re-evaluated:Patient Re-evaluated prior to inductionOxygen Delivery Method: Circle system utilized Preoxygenation: Pre-oxygenation with 100% oxygen Intubation Type: IV induction and Cricoid Pressure applied Ventilation: Mask ventilation without difficulty and Oral airway inserted - appropriate to patient size Laryngoscope Size: Mac and 3 Grade View: Grade II Tube type: Oral Tube size: 7.0 mm Number of attempts: 1 Airway Equipment and Method: Stylet Placement Confirmation: ETT inserted through vocal cords under direct vision,  positive ETCO2 and breath sounds checked- equal and bilateral Secured at: 22 cm Tube secured with: Tape Dental Injury: Teeth and Oropharynx as per pre-operative assessment

## 2014-01-29 NOTE — Anesthesia Preprocedure Evaluation (Signed)
Anesthesia Evaluation  Patient identified by MRN, date of birth, ID band Patient awake    Reviewed: Allergy & Precautions, H&P , NPO status , Patient's Chart, lab work & pertinent test results  Airway Mallampati: II TM Distance: >3 FB Neck ROM: Full    Dental  (+) Teeth Intact   Pulmonary neg pulmonary ROS,  breath sounds clear to auscultation        Cardiovascular negative cardio ROS  Rhythm:Regular Rate:Normal     Neuro/Psych    GI/Hepatic GERD-  Medicated and Controlled,  Endo/Other    Renal/GU      Musculoskeletal   Abdominal   Peds  Hematology   Anesthesia Other Findings   Reproductive/Obstetrics                           Anesthesia Physical Anesthesia Plan  ASA: II  Anesthesia Plan: General   Post-op Pain Management:    Induction: Intravenous  Airway Management Planned: Oral ETT  Additional Equipment:   Intra-op Plan:   Post-operative Plan: Extubation in OR  Informed Consent: I have reviewed the patients History and Physical, chart, labs and discussed the procedure including the risks, benefits and alternatives for the proposed anesthesia with the patient or authorized representative who has indicated his/her understanding and acceptance.     Plan Discussed with:   Anesthesia Plan Comments:         Anesthesia Quick Evaluation

## 2014-01-29 NOTE — Op Note (Signed)
NAME:  Kristy Mckinney, PARROW NO.:  [id number]  MEDICAL RECORD NO.:  [id number]  LOCATION:  [facility]                          FACILITY:  [facility]  PHYSICIAN:  Felicie Morn, M.D. DATE OF BIRTH:  January 29, 1932  DATE OF PROCEDURE: DATE OF DISCHARGE:                              OPERATIVE REPORT   DIAGNOSIS:  Cholecystitis, cholelithiasis.  PROCEDURE:  Laparoscopic cholecystectomy.  SPECIMEN:  Gallbladder and stone.  GROSS OPERATIVE FINDINGS:  The patient had some adhesions around the Hartmann's pouch, and small cystic duct, otherwise apart from the stone within the gallbladder and some residual inflammation.  No other abnormalities noted.  NOTE:  This is a 80 year old white female who is remarkably in good health, who had approximately a 3-year history of recurrent right upper quadrant postprandial pain radiating to her back.  This had worsened over the last month, this was accompanied with nausea.  She was seen by Dr. Luan Pulling and after she was found to have cholelithiasis and a positive Murphy sign on sonogram, she was referred my way.  Liver function studies were grossly within normal limits preoperatively.  We discussed the surgery with her in detail discussing complications, not limited to, but including bleeding, infection, damage to bile ducts, perforation of organs, transitory diarrhea, and the possibility that open surgery might be required.  Informed consent was obtained.  TECHNIQUE:  The patient was placed in supine position.  After the adequate administration of general anesthesia via endotracheal intubation, her entire abdomen was prepped with an alcohol antiseptic solution, and she was draped in the usual manner.  Prior to this, a Foley catheter was aseptically inserted.  A periumbilical incision was carried out over the superior aspect of the umbilicus.  The fascia was grasped with a sharp towel clip and elevated and a Veress needle was inserted and confirmed  in position with a saline drop test.  We then insufflated the abdomen with approximately 3-3.2 L of CO2 under direct vision using the Visiport technique.  An 11-mm cannula was placed in the umbilicus area and an 45-GY cannula was placed in the epigastrium.  Two 5 mm cannulas were placed in the right upper quadrant laterally.  The gallbladder was grasped, its adhesions were taken down.  The cystic artery was clearly visualized, triply silver clipped, and divided as was the cystic duct.  The gallbladder was then removed using the hook cautery device and this had minimal oozing.  We then placed a piece of Surgicel in the liver bed and a Jackson-Pratt drain which exited through one of the lateral most incisions and this was sutured in place with 3-0 nylon.  After checking for hemostasis and irrigating, the abdomen was then desufflated.  We closed the larger incisions in the umbilicus and the epigastrium with 0 Polysorb, used 0.5% Sensorcaine at the port sites for postoperative comfort and then closed the incisions with a stapling device.  Sterile dressing was applied.  Prior to closure, all sponge, needle, and instrument counts were found to be correct.  Estimated blood loss was less than 25 mL.  The patient received 1400 mL crystalloids intraoperatively.  There were no complications.     Felicie Morn, M.D.  WB/MEDQ  D:  01/29/2014  T:  01/29/2014  Job:  [id number]  cc:   Luan Pulling

## 2014-01-29 NOTE — Progress Notes (Signed)
Surgery  Filed Vitals:   01/29/14 1115  BP:   Pulse: 106  Temp:   Resp: 19  BP 174/78, afebrile  Pt awake and alert though a little sleepy after morphine.  Dressings dry and in tact with minimal JP serous sanguinous drainage.  Pt has not voided yet.  If not able to void will catheterize.  Lungs are fairly clear bilat and pt has had no drop in her O2 sat.  Therefore doubt aspiration pneumonia.

## 2014-01-29 NOTE — Brief Op Note (Signed)
01/29/2014  8:46 AM  PATIENT:  Kristy Mckinney  80 y.o. female  PRE-OPERATIVE DIAGNOSIS:  cholelithiasis, cholecystitis  POST-OPERATIVE DIAGNOSIS:  cholelithiasis, cholecystitis  PROCEDURE:  Procedure(s): LAPAROSCOPIC CHOLECYSTECTOMY (N/A)  SURGEON:  Surgeon(s) and Role:    * Scherry Ran, MD - Primary  PHYSICIAN ASSISTANT:   ASSISTANTS: none   ANESTHESIA:   general  EBL:  Total I/O In: 1100 [I.V.:1100] Out: 75 [Urine:75]  BLOOD ADMINISTERED:none  DRAINS: Penrose drain in the in the liver bed.   LOCAL MEDICATIONS USED:  MARCAINE  0.5%  ~ 10 cc.   SPECIMEN:  Source of Specimen:  gall bladder and stone.  DISPOSITION OF SPECIMEN:  PATHOLOGY  COUNTS:  YES  TOURNIQUET:  * No tourniquets in log *  DICTATION: .Other Dictation: Dictation Number OR dict. #  [id number].  PLAN OF CARE: Admit for overnight observation  PATIENT DISPOSITION:  PACU - hemodynamically stable.   Delay start of Pharmacological VTE agent (>24hrs) due to surgical blood loss or risk of bleeding: not applicable

## 2014-01-29 NOTE — Anesthesia Postprocedure Evaluation (Signed)
  Anesthesia Post-op Note  Patient: Kristy Mckinney  Procedure(s) Performed: Procedure(s): LAPAROSCOPIC CHOLECYSTECTOMY (N/A)  Patient Location: PACU  Anesthesia Type:General  Level of Consciousness: awake, alert , oriented and patient cooperative  Airway and Oxygen Therapy: Patient Spontanous Breathing and Patient connected to face mask oxygen  Post-op Pain: 3 /10, mild  Post-op Assessment: Post-op Vital signs reviewed, Patient's Cardiovascular Status Stable, Respiratory Function Stable, Patent Airway, No signs of Nausea or vomiting and Pain level controlled  Post-op Vital Signs: Reviewed and stable  Last Vitals:  Filed Vitals:   01/29/14 0725  BP: 116/55  Pulse:   Temp:   Resp: 30    Complications: No apparent anesthesia complications

## 2014-01-30 ENCOUNTER — Encounter (HOSPITAL_COMMUNITY): Payer: Self-pay | Admitting: General Surgery

## 2014-01-30 DIAGNOSIS — K8010 Calculus of gallbladder with chronic cholecystitis without obstruction: Secondary | ICD-10-CM | POA: Diagnosis not present

## 2014-01-30 LAB — BASIC METABOLIC PANEL
Anion gap: 10 (ref 5–15)
BUN: 8 mg/dL (ref 6–23)
CALCIUM: 8.8 mg/dL (ref 8.4–10.5)
CO2: 28 meq/L (ref 19–32)
CREATININE: 0.69 mg/dL (ref 0.50–1.10)
Chloride: 102 mEq/L (ref 96–112)
GFR, EST NON AFRICAN AMERICAN: 81 mL/min — AB (ref >90)
Glucose, Bld: 109 mg/dL — ABNORMAL HIGH (ref 70–99)
Potassium: 3.9 mEq/L (ref 3.7–5.3)
SODIUM: 140 meq/L (ref 137–147)

## 2014-01-30 LAB — CBC
HCT: 34.7 % — ABNORMAL LOW (ref 36.0–46.0)
Hemoglobin: 11.5 g/dL — ABNORMAL LOW (ref 12.0–15.0)
MCH: 27.6 pg (ref 26.0–34.0)
MCHC: 33.1 g/dL (ref 30.0–36.0)
MCV: 83.2 fL (ref 78.0–100.0)
PLATELETS: 182 10*3/uL (ref 150–400)
RBC: 4.17 MIL/uL (ref 3.87–5.11)
RDW: 14.4 % (ref 11.5–15.5)
WBC: 8.3 10*3/uL (ref 4.0–10.5)

## 2014-01-30 LAB — HEPATIC FUNCTION PANEL
ALT: 25 U/L (ref 0–35)
AST: 41 U/L — ABNORMAL HIGH (ref 0–37)
Albumin: 3.4 g/dL — ABNORMAL LOW (ref 3.5–5.2)
Alkaline Phosphatase: 44 U/L (ref 39–117)
TOTAL PROTEIN: 6.3 g/dL (ref 6.0–8.3)
Total Bilirubin: 0.8 mg/dL (ref 0.3–1.2)

## 2014-01-30 NOTE — Discharge Summary (Signed)
NAME:  Kristy Mckinney, Kristy Mckinney NO.:  [id number]  MEDICAL RECORD NO.:  [id number]  LOCATION:  A328                          FACILITY:  [facility]  PHYSICIAN:  Felicie Morn, M.D. DATE OF BIRTH:  [birth year]  DATE OF ADMISSION:  01/29/2014 DATE OF DISCHARGE:  07/24/2015LH                              DISCHARGE SUMMARY   DIAGNOSES:  Cholecystitis, cholelithiasis.  PROCEDURE:  On January 29, 2014, laparoscopic cholecystectomy.  NOTE:  This is a 80 year old white female who was referred for biliary colic.  She had ongoing problems for several years, however, this had gotten worse over the last month or so.  She was seen by Dr. Luan Pulling and she was sent to my office where we planned for an elective laparoscopic cholecystectomy.  She had no other medical problems, and she was in remarkable good health for her age, looking and acting much younger than her chronological years, and she was taken to surgery on January 29, 2014, via the outpatient department where an uneventful laparoscopic cholecystectomy was carried out.  Prior to this, she had been placed on a restrictive diet allowing [place] to operate on a fairly uninflamed gallbladder.  She had some emesis postoperatively, and we were concerned that she may have had some aspiration, this however was not borne out on a postoperative chest x-ray, and clinically her lungs remained clear, and her O2 sats did not drop.  She was monitored on telemetry postoperatively, and she did not show any changes to suggest any problems with aspiration pneumonia.  At the time of discharge, she was afebrile, her wounds were clean, she had minimal Jackson-Pratt drainage, which was serosanguineous in nature and this was removed on the first postoperative day.  Her wounds were redressed and cleaned and at the time of discharge, she had no chest pain, shortness of breath, dysuria, or leg pain.  We had planned for followup arrangements for her.  She has some pain  medicines for her chronic back pain, and I told her this should be enough to cover her for her postoperative discomfort if Tylenol were not sufficient.  She has also been told to take Colace as she may increase her p.o. narcotic pain intake for pain postoperatively.  She will follow up with me perioperatively after which she is to return to Dr. Luan Pulling for any medical problems she may have in the future.  Postoperative lab work was within normal limits.  She had no bump in her bilirubin, and her liver function studies were grossly within normal limits postoperatively.     Felicie Morn, M.D.     WB/MEDQ  D:  01/30/2014  T:  01/30/2014  Job:  [id number]  cc:   Percell Miller L. Luan Pulling, M.D. Fax: [phone]

## 2014-01-30 NOTE — Addendum Note (Signed)
Addendum created 01/30/14 1005 by Mickel Baas, CRNA   Modules edited: Notes Section   Notes Section:  File: [id number]

## 2014-01-30 NOTE — Progress Notes (Signed)
Pt up to bedside commode this morning with standby assistance.  Pt consumed 100% of breakfast with no c/o nausea.  Pt stated she was in some pain and it feel like a "gassy" type pt.  Encourage pt to ambulate.

## 2014-01-30 NOTE — Progress Notes (Signed)
POD #1  Filed Vitals:   01/30/14 0619  BP: 141/50  Pulse: 78  Temp: 99.4 F (37.4 C)  Resp: 18    Pt feels much better. No breathing problems or changes in O2 sat and lungs are clear.  Abdomen is soft.   Minimal serous sanguinous drainage from JP, this has been removed.  Wounds clean and redressed and discharge and follow up arranged.  Discharge note dictated.  # [id number].

## 2014-01-30 NOTE — Anesthesia Postprocedure Evaluation (Signed)
  Anesthesia Post-op Note  Patient: Kristy Mckinney  Procedure(s) Performed: Procedure(s): LAPAROSCOPIC CHOLECYSTECTOMY (N/A)  Patient Location: Room 328  Anesthesia Type:General  Level of Consciousness: awake, alert , oriented and patient cooperative  Airway and Oxygen Therapy: Patient Spontanous Breathing  Post-op Pain: mild  Post-op Assessment: Post-op Vital signs reviewed, Patient's Cardiovascular Status Stable, Respiratory Function Stable, Patent Airway, No signs of Nausea or vomiting and Pain level controlled  Post-op Vital Signs: Reviewed and stable  Last Vitals:  Filed Vitals:   01/30/14 0619  BP: 141/50  Pulse: 78  Temp: 37.4 C  Resp: 18    Complications: No apparent anesthesia complications

## 2014-06-01 MED ORDER — TROPICAMIDE 1 % OP SOLN
OPHTHALMIC | Status: AC
  Filled 2014-06-01: qty 3

## 2014-06-01 NOTE — Discharge Instructions (Signed)
MILLETTE HALBERSTAM  06/01/2014     Instructions    Activity: No Restrictions.   Diet: Resume Diet you were on at home.   Pain Medication: Tylenol if Needed.   CONTACT YOUR DOCTOR IF YOU HAVE PAIN, REDNESS IN YOUR EYE, OR DECREASED VISION.   Follow-up: today between 1:00-2:00  with Elta Guadeloupe T. Gershon Crane, MD.   Dr. Gershon Crane: [phone]   If you find that you cannot contact your physician, but feel that your signs and   Symptoms warrant a physician's attention, call the Emergency Room at

## 2014-06-02 ENCOUNTER — Encounter (HOSPITAL_COMMUNITY): Payer: Self-pay | Admitting: *Deleted

## 2014-06-02 ENCOUNTER — Encounter (HOSPITAL_COMMUNITY)
Admission: RE | Disposition: A | Discharge status: Home / Self Care | Payer: Self-pay | Source: Ambulatory Visit | Attending: Ophthalmology

## 2014-06-02 ENCOUNTER — Ambulatory Visit (HOSPITAL_COMMUNITY)
Admission: RE | Admit: 2014-06-02 | Discharge: 2014-06-02 | Disposition: A | Discharge status: Home / Self Care | Payer: Medicare HMO | Source: Ambulatory Visit | Attending: Ophthalmology | Admitting: Ophthalmology

## 2014-06-02 DIAGNOSIS — H26492 Other secondary cataract, left eye: Secondary | ICD-10-CM | POA: Insufficient documentation

## 2014-06-02 HISTORY — PX: YAG LASER APPLICATION: SHX6189

## 2014-06-02 SURGERY — TREATMENT, USING YAG LASER
Anesthesia: LOCAL | Laterality: Left

## 2014-06-02 MED ORDER — TROPICAMIDE 1 % OP SOLN
1.0000 [drp] | OPHTHALMIC | Status: AC
  Administered 2014-06-02 (×3): 1 [drp] via OPHTHALMIC

## 2014-06-02 NOTE — H&P (Signed)
The patient was re examined and there is no change in the patients condition since the original H and P. 

## 2014-06-02 NOTE — Op Note (Signed)
Kristy Alcindor T. Gershon Crane, MD  Procedure: Yag Capsulotomy  Yag Laser Self Test Completedyes. Procedure: Posterior Capsulotomy, Eye Protection Worn by Staff yes. Laser In Use Sign on Door yes.  Laser: Nd:YAG Spot Size: Fixed Burst Mode: III Power Setting: 3.6 mJ/burst Number of shots: 16 Total energy delivered: 57.2 mJ   The patient tolerated the procedure without difficulty. No complications were encountered.   The patient was discharged home with the instructions to continue all her current glaucoma medications, if any.   Patient instructed to go to office at 0100 for intraocular pressure check.  Patient verbalizes understanding of discharge instructions Yes.  .    Pre-Operative Diagnosis: After-Cataract, obscuring vision, 366.53 OS Post-Operative Diagnosis: After-Cataract, obscuring vision, 366.53 OS

## 2014-06-03 ENCOUNTER — Encounter (HOSPITAL_COMMUNITY): Payer: Self-pay | Admitting: Ophthalmology

## 2015-05-06 ENCOUNTER — Other Ambulatory Visit (HOSPITAL_COMMUNITY): Payer: Self-pay | Admitting: Pulmonary Disease

## 2015-05-06 DIAGNOSIS — R140 Abdominal distension (gaseous): Secondary | ICD-10-CM

## 2015-05-06 LAB — VITAMIN D 25 HYDROXY (VIT D DEFICIENCY, FRACTURES): Vit D, 25-Hydroxy: 24.4

## 2015-05-11 ENCOUNTER — Ambulatory Visit (HOSPITAL_COMMUNITY)
Admission: RE | Admit: 2015-05-11 | Discharge: 2015-05-11 | Disposition: A | Discharge status: Home / Self Care | Payer: PPO | Source: Ambulatory Visit | Attending: Pulmonary Disease | Admitting: Pulmonary Disease

## 2015-05-11 DIAGNOSIS — R140 Abdominal distension (gaseous): Secondary | ICD-10-CM | POA: Diagnosis present

## 2015-05-11 DIAGNOSIS — K7689 Other specified diseases of liver: Secondary | ICD-10-CM | POA: Insufficient documentation

## 2015-05-11 DIAGNOSIS — Z9049 Acquired absence of other specified parts of digestive tract: Secondary | ICD-10-CM | POA: Diagnosis not present

## 2015-09-06 ENCOUNTER — Other Ambulatory Visit (HOSPITAL_COMMUNITY): Payer: Self-pay | Admitting: Pulmonary Disease

## 2015-09-06 DIAGNOSIS — I739 Peripheral vascular disease, unspecified: Secondary | ICD-10-CM | POA: Diagnosis not present

## 2015-09-06 DIAGNOSIS — F419 Anxiety disorder, unspecified: Secondary | ICD-10-CM | POA: Diagnosis not present

## 2015-09-06 DIAGNOSIS — Z1231 Encounter for screening mammogram for malignant neoplasm of breast: Secondary | ICD-10-CM

## 2015-09-06 DIAGNOSIS — Z780 Asymptomatic menopausal state: Secondary | ICD-10-CM

## 2015-09-06 DIAGNOSIS — I10 Essential (primary) hypertension: Secondary | ICD-10-CM | POA: Diagnosis not present

## 2015-09-06 DIAGNOSIS — K580 Irritable bowel syndrome with diarrhea: Secondary | ICD-10-CM | POA: Diagnosis not present

## 2015-09-13 ENCOUNTER — Ambulatory Visit (HOSPITAL_COMMUNITY)
Admission: RE | Admit: 2015-09-13 | Discharge: 2015-09-13 | Disposition: A | Discharge status: Home / Self Care | Payer: PPO | Source: Ambulatory Visit | Attending: Pulmonary Disease | Admitting: Pulmonary Disease

## 2015-09-13 DIAGNOSIS — I739 Peripheral vascular disease, unspecified: Secondary | ICD-10-CM | POA: Diagnosis not present

## 2015-09-13 DIAGNOSIS — Z1231 Encounter for screening mammogram for malignant neoplasm of breast: Secondary | ICD-10-CM | POA: Diagnosis not present

## 2015-09-13 DIAGNOSIS — Z780 Asymptomatic menopausal state: Secondary | ICD-10-CM

## 2015-09-13 DIAGNOSIS — M810 Age-related osteoporosis without current pathological fracture: Secondary | ICD-10-CM | POA: Diagnosis not present

## 2015-09-13 DIAGNOSIS — I70212 Atherosclerosis of native arteries of extremities with intermittent claudication, left leg: Secondary | ICD-10-CM | POA: Diagnosis not present

## 2015-09-13 LAB — HM MAMMOGRAPHY

## 2015-09-20 DIAGNOSIS — Z1211 Encounter for screening for malignant neoplasm of colon: Secondary | ICD-10-CM | POA: Diagnosis not present

## 2015-09-23 ENCOUNTER — Encounter (INDEPENDENT_AMBULATORY_CARE_PROVIDER_SITE_OTHER): Payer: Self-pay | Admitting: *Deleted

## 2015-10-13 ENCOUNTER — Ambulatory Visit (INDEPENDENT_AMBULATORY_CARE_PROVIDER_SITE_OTHER): Payer: PPO | Admitting: Internal Medicine

## 2015-10-25 ENCOUNTER — Other Ambulatory Visit (INDEPENDENT_AMBULATORY_CARE_PROVIDER_SITE_OTHER): Payer: Self-pay | Admitting: Internal Medicine

## 2015-10-25 ENCOUNTER — Encounter (INDEPENDENT_AMBULATORY_CARE_PROVIDER_SITE_OTHER): Payer: Self-pay | Admitting: *Deleted

## 2015-10-25 ENCOUNTER — Ambulatory Visit (INDEPENDENT_AMBULATORY_CARE_PROVIDER_SITE_OTHER): Payer: PPO | Admitting: Internal Medicine

## 2015-10-25 ENCOUNTER — Encounter (INDEPENDENT_AMBULATORY_CARE_PROVIDER_SITE_OTHER): Payer: Self-pay | Admitting: Internal Medicine

## 2015-10-25 VITALS — BP 140/60 | HR 64 | Temp 98.2°F | Ht 65.0 in | Wt 152.6 lb

## 2015-10-25 DIAGNOSIS — R195 Other fecal abnormalities: Secondary | ICD-10-CM

## 2015-10-25 NOTE — Progress Notes (Addendum)
   Subjective:    Patient ID: Kristy Mckinney, female    DOB: 1932-01-13, 82 y.o.   MRN: [id number]  HPI Referred by Dr. Luan Pulling for positive stool card. She states she some BRRB a couple of nights ago. States she has hemorrhoids. She has never undergone a colonoscopy. Her appetite is good. No weight loss. She denies any abdominal pain.      Review of Systems Past Medical History  Diagnosis Date  . GERD (gastroesophageal reflux disease)   . Chronic back pain     Past Surgical History  Procedure Laterality Date  . Back surgery    . Abdominal hysterectomy    . Tonsillectomy    . Cholecystectomy N/A 01/29/2014    Procedure: LAPAROSCOPIC CHOLECYSTECTOMY;  Surgeon: Scherry Ran, MD;  Location: [facility];  Service: General;  Laterality: N/A;  . Yag laser application Left [id number]    Procedure: YAG LASER APPLICATION;  Surgeon: Elta Guadeloupe T. Gershon Crane, MD;  Location: [facility];  Service: Ophthalmology;  Laterality: Left;  left    Allergies  Allergen Reactions  . Penicillins Anaphylaxis  . Feldene [Piroxicam] Nausea And Vomiting  . Sulfa Antibiotics     swelling    Current Outpatient Prescriptions on File Prior to Visit  Medication Sig Dispense Refill  . ALPRAZolam (XANAX) 1 MG tablet Take 1 mg by mouth at bedtime.    . calcium-vitamin D (OS-CAL 500 + D) 500-200 MG-UNIT per tablet Take 1 tablet by mouth 2 (two) times daily.     Marland Kitchen HYDROcodone-acetaminophen (LORTAB) 10-500 MG per tablet Take 1 tablet by mouth 2 (two) times daily as needed. Pain    . Multiple Vitamin (MULTIVITAMIN WITH MINERALS) TABS Take 1 tablet by mouth daily.    Marland Kitchen omeprazole (PRILOSEC) 20 MG capsule Take 20 mg by mouth every morning.     Vladimir Faster Glycol-Propyl Glycol (SYSTANE OP) Apply 1 drop to eye daily as needed. Dry Eyes    . sertraline (ZOLOFT) 50 MG tablet Take 50 mg by mouth every morning.      No current facility-administered medications on file prior to visit.        Objective:   Physical Exam Blood  pressure 140/60, pulse 64, temperature 98.2 F (36.8 C), height 5\' 5"  (1.651 m), weight 152 lb 9.6 oz (69.219 kg). Alert and oriented. Skin warm and dry. Oral mucosa is moist.   . Sclera anicteric, conjunctivae is pink. Thyroid not enlarged. No cervical lymphadenopathy. Lungs clear. Heart regular rate and rhythm.  Abdomen is soft. Bowel sounds are positive. No hepatomegaly. No abdominal masses felt. No tenderness.  No edema to lower extremities. Patient is alert and oriented.  l      Assessment & Plan:  + stool card. Colonic neoplasm needs to be ruled out.   Colonoscopy.

## 2015-10-25 NOTE — Patient Instructions (Signed)
Colonoscopy.  The risks and benefits such as perforation, bleeding, and infection were reviewed with the patient and is agreeable. 

## 2015-10-27 ENCOUNTER — Encounter (HOSPITAL_COMMUNITY): Payer: Self-pay | Admitting: *Deleted

## 2015-10-27 ENCOUNTER — Encounter (HOSPITAL_COMMUNITY)
Admission: RE | Disposition: A | Discharge status: Home / Self Care | Payer: Self-pay | Source: Ambulatory Visit | Attending: Internal Medicine

## 2015-10-27 ENCOUNTER — Ambulatory Visit (HOSPITAL_COMMUNITY)
Admission: RE | Admit: 2015-10-27 | Discharge: 2015-10-27 | Disposition: A | Discharge status: Home / Self Care | Payer: PPO | Source: Ambulatory Visit | Attending: Internal Medicine | Admitting: Internal Medicine

## 2015-10-27 DIAGNOSIS — R197 Diarrhea, unspecified: Secondary | ICD-10-CM | POA: Insufficient documentation

## 2015-10-27 DIAGNOSIS — K5730 Diverticulosis of large intestine without perforation or abscess without bleeding: Secondary | ICD-10-CM | POA: Insufficient documentation

## 2015-10-27 DIAGNOSIS — K644 Residual hemorrhoidal skin tags: Secondary | ICD-10-CM | POA: Diagnosis not present

## 2015-10-27 DIAGNOSIS — K219 Gastro-esophageal reflux disease without esophagitis: Secondary | ICD-10-CM | POA: Insufficient documentation

## 2015-10-27 DIAGNOSIS — R195 Other fecal abnormalities: Secondary | ICD-10-CM | POA: Insufficient documentation

## 2015-10-27 HISTORY — DX: Diarrhea, unspecified: R19.7

## 2015-10-27 HISTORY — PX: COLONOSCOPY: SHX5424

## 2015-10-27 LAB — HM COLONOSCOPY

## 2015-10-27 SURGERY — COLONOSCOPY
Anesthesia: Moderate Sedation

## 2015-10-27 MED ORDER — SODIUM CHLORIDE 0.9 % IV SOLN
INTRAVENOUS | Status: DC
  Administered 2015-10-27: 14:00:00 via INTRAVENOUS

## 2015-10-27 MED ORDER — MEPERIDINE HCL 50 MG/ML IJ SOLN
INTRAMUSCULAR | Status: AC
  Filled 2015-10-27: qty 1

## 2015-10-27 MED ORDER — STERILE WATER FOR IRRIGATION IR SOLN
Status: DC | PRN
  Administered 2015-10-27: 14:00:00

## 2015-10-27 MED ORDER — MEPERIDINE HCL 50 MG/ML IJ SOLN
INTRAMUSCULAR | Status: DC | PRN
  Administered 2015-10-27 (×2): 25 mg via INTRAVENOUS

## 2015-10-27 MED ORDER — MIDAZOLAM HCL 5 MG/5ML IJ SOLN
INTRAMUSCULAR | Status: AC
  Filled 2015-10-27: qty 10

## 2015-10-27 MED ORDER — MIDAZOLAM HCL 5 MG/5ML IJ SOLN
INTRAMUSCULAR | Status: DC | PRN
  Administered 2015-10-27: 1 mg via INTRAVENOUS
  Administered 2015-10-27: 2 mg via INTRAVENOUS
  Administered 2015-10-27: 1 mg via INTRAVENOUS
  Administered 2015-10-27: 2 mg via INTRAVENOUS

## 2015-10-27 NOTE — Op Note (Signed)
[facility] Patient Name: Kristy Mckinney Procedure Date: 10/27/2015 7:16 AM MRN: [id number] Date of Birth: 08-07-1931 Attending MD: Hildred Laser , MD CSN: [id number] Age: 82 Admit Type: Outpatient Procedure:                Colonoscopy Indications:              Heme positive stool Providers:                Hildred Laser, MD, Lurline Del, RN, Georgeann Oppenheim,                            Technician Referring MD:             Jasper Loser. Luan Pulling, MD Medicines:                Meperidine 50 mg IV, Midazolam 6 mg IV Complications:            No immediate complications. Estimated Blood Loss:     Estimated blood loss: none. Procedure:                Pre-Anesthesia Assessment:                           - Prior to the procedure, a History and Physical                            was performed, and patient medications and                            allergies were reviewed. The patient's tolerance of                            previous anesthesia was also reviewed. The risks                            and benefits of the procedure and the sedation                            options and risks were discussed with the patient.                            All questions were answered, and informed consent                            was obtained. Prior Anticoagulants: The patient has                            taken no previous anticoagulant or antiplatelet                            agents. ASA Grade Assessment: II - A patient with                            mild systemic disease. After reviewing the risks  and benefits, the patient was deemed in                            satisfactory condition to undergo the procedure.                           After obtaining informed consent, the colonoscope                            was passed under direct vision. Throughout the                            procedure, the patient's blood pressure, pulse, and                             oxygen saturations were monitored continuously. The                            [id number]) was introduced through the                            anus and advanced to the the terminal ileum. The                            colonoscopy was performed without difficulty. The                            patient tolerated the procedure well. The quality                            of the bowel preparation was adequate. The terminal                            ileum, ileocecal valve, appendiceal orifice, and                            rectum were photographed. Scope In: 2:14:55 PM Scope Out: 2:34:09 PM Scope Withdrawal Time: 0 hours 11 minutes 18 seconds  Total Procedure Duration: 0 hours 19 minutes 14 seconds  Findings:      The terminal ileum appeared normal.      Scattered small and large-mouthed diverticula were found in the sigmoid       colon.      External hemorrhoids were found during retroflexion. The hemorrhoids       were small. Impression:               - The examined portion of the ileum was normal.                           - Diverticulosis in the sigmoid colon.                           - External hemorrhoids.                           -  No specimens collected. Moderate Sedation:      Moderate (conscious) sedation was administered by the endoscopy nurse       and supervised by the endoscopist. The following parameters were       monitored: oxygen saturation, heart rate, blood pressure, CO2       capnography and response to care. Total physician intraservice time was       28 minutes. Recommendation:           - Patient has a contact number available for                            emergencies. The signs and symptoms of potential                            delayed complications were discussed with the                            patient. Return to normal activities tomorrow.                            Written discharge instructions were provided to the                             patient.                           - High fiber diet today.                           - Continue present medications.                           - No repeat colonoscopy due to age.                           - Return to primary care physician as previously                            scheduled. Procedure Code(s):        --- Professional ---                           [phone], Colonoscopy, flexible; diagnostic, including                            collection of specimen(s) by brushing or washing,                            when performed (separate procedure)                           99152, Moderate sedation services provided by the                            same physician or other qualified health care  professional performing the diagnostic or                            therapeutic service that the sedation supports,                            requiring the presence of an independent trained                            observer to assist in the monitoring of the                            patient's level of consciousness and physiological                            status; initial 15 minutes of intraservice time,                            patient age 80 years or older                           [phone], Moderate sedation services; each additional                            15 minutes intraservice time Diagnosis Code(s):        --- Professional ---                           K64.4, Residual hemorrhoidal skin tags                           R19.5, Other fecal abnormalities                           K57.30, Diverticulosis of large intestine without                            perforation or abscess without bleeding CPT copyright 2016 American [employer] Association. All rights reserved. The codes documented in this report are preliminary and upon coder review may  be revised to meet current compliance requirements. Hildred Laser, MD Hildred Laser, MD 10/27/2015 2:45:17  PM This report has been signed electronically. Number of Addenda: 0

## 2015-10-27 NOTE — H&P (Signed)
Kristy Mckinney is an 82 y.o. female.   Chief Complaint: Patient is here for colonoscopy. HPI: Patient is 82 year old Caucasian female who was receiving noted to have heme-positive stool therefore referred over for diagnostic colonoscopy. She has occasional hematochezia felt to be secondary to hemorrhoids. She denies frank bleeding. She also denies abdominal pain. She had diarrhea for over 4 months. She has been on wide bursae for 2 months and now having normal stools. She states she was busy looking after her husband and during that process she has lost 40 pounds. She has gained 8 pounds back. Family history is negative for CRC.  Past Medical History  Diagnosis Date  . GERD (gastroesophageal reflux disease)   . Chronic back pain   . Diarrhea     Past Surgical History  Procedure Laterality Date  . Back surgery    . Abdominal hysterectomy    . Tonsillectomy    . Cholecystectomy N/A 01/29/2014    Procedure: LAPAROSCOPIC CHOLECYSTECTOMY;  Surgeon: Scherry Ran, MD;  Location: [facility];  Service: General;  Laterality: N/A;  . Yag laser application Left [id number]    Procedure: YAG LASER APPLICATION;  Surgeon: Elta Guadeloupe T. Gershon Crane, MD;  Location: [facility];  Service: Ophthalmology;  Laterality: Left;  left    History reviewed. No pertinent family history. Social History:  reports that she has never smoked. She does not have any smokeless tobacco history on file. She reports that she does not drink alcohol or use illicit drugs.  Allergies:  Allergies  Allergen Reactions  . Penicillins Anaphylaxis  . Feldene [Piroxicam] Nausea And Vomiting  . Sulfa Antibiotics     swelling    Medications Prior to Admission  Medication Sig Dispense Refill  . ALPRAZolam (XANAX) 1 MG tablet Take 1 mg by mouth at bedtime.    . Biotin w/ Vitamins C & E (HAIR/SKIN/NAILS PO) Take 1 tablet by mouth daily.    . calcium-vitamin D (OS-CAL 500 + D) 500-200 MG-UNIT per tablet Take 1 tablet by mouth 2 (two) times  daily.     . Eluxadoline (VIBERZI) 100 MG TABS Take by mouth.    Marland Kitchen HYDROcodone-acetaminophen (NORCO) 10-325 MG tablet 1 tablet 2 (two) times daily as needed.    . Multiple Vitamin (MULTIVITAMIN WITH MINERALS) TABS Take 1 tablet by mouth daily.    Marland Kitchen omeprazole (PRILOSEC) 20 MG capsule Take 20 mg by mouth every morning.     Vladimir Faster Glycol-Propyl Glycol (SYSTANE OP) Apply 1 drop to eye daily as needed. Dry Eyes    . sertraline (ZOLOFT) 50 MG tablet Take 50 mg by mouth every morning.       No results found for this or any previous visit (from the past 48 hour(s)). No results found.  ROS  Blood pressure 152/62, pulse 80, temperature 98.8 F (37.1 C), temperature source Oral, resp. rate 16, SpO2 97 %. Physical Exam  Constitutional: She appears well-developed and well-nourished.  HENT:  Mouth/Throat: Oropharynx is clear and moist.  Eyes: Conjunctivae are normal. No scleral icterus.  Neck: No thyromegaly present.  Cardiovascular: Normal rate and regular rhythm.   Murmur (faint systolic ejection murmur best heard at left lower sternal border.) heard. Respiratory: Effort normal and breath sounds normal.  GI: Soft. She exhibits no distension and no mass. There is no tenderness.  Musculoskeletal: She exhibits no edema.  Neurological: She is alert.  Skin: Skin is warm and dry.     Assessment/Plan Heme-positive stool. Diagnostic colonoscopy.  Rogene Houston, MD  10/27/2015, 2:02 PM

## 2015-10-27 NOTE — Discharge Instructions (Signed)
Resume usual medications and high fiber diet. No driving for 24 hours.  Colonoscopy, Care After Refer to this sheet in the next few weeks. These instructions provide you with information on caring for yourself after your procedure. Your health care provider may also give you more specific instructions. Your treatment has been planned according to current medical practices, but problems sometimes occur. Call your health care provider if you have any problems or questions after your procedure. WHAT TO EXPECT AFTER THE PROCEDURE  After your procedure, it is typical to have the following:  A small amount of blood in your stool.  Moderate amounts of gas and mild abdominal cramping or bloating. HOME CARE INSTRUCTIONS  Do not drive, operate machinery, or sign important documents for 24 hours.  You may shower and resume your regular physical activities, but move at a slower pace for the first 24 hours.  Take frequent rest periods for the first 24 hours.  Walk around or put a warm pack on your abdomen to help reduce abdominal cramping and bloating.  Drink enough fluids to keep your urine clear or pale yellow.  You may resume your normal diet as instructed by your health care provider. Avoid heavy or fried foods that are hard to digest.  Avoid drinking alcohol for 24 hours or as instructed by your health care provider.  Only take over-the-counter or prescription medicines as directed by your health care provider.  If a tissue sample (biopsy) was taken during your procedure:  Do not take aspirin or blood thinners for 7 days, or as instructed by your health care provider.  Do not drink alcohol for 7 days, or as instructed by your health care provider.  Eat soft foods for the first 24 hours. SEEK MEDICAL CARE IF: You have persistent spotting of blood in your stool 2-3 days after the procedure. SEEK IMMEDIATE MEDICAL CARE IF:  You have more than a small spotting of blood in your  stool.  You pass large blood clots in your stool.  Your abdomen is swollen (distended).  You have nausea or vomiting.  You have a fever.  You have increasing abdominal pain that is not relieved with medicine.   This information is not intended to replace advice given to you by your health care provider. Make sure you discuss any questions you have with your health care provider.   Diverticulosis Diverticulosis is the condition that develops when small pouches (diverticula) form in the wall of your colon. Your colon, or large intestine, is where water is absorbed and stool is formed. The pouches form when the inside layer of your colon pushes through weak spots in the outer layers of your colon. CAUSES  No one knows exactly what causes diverticulosis. RISK FACTORS  Being older than 47. Your risk for this condition increases with age. Diverticulosis is rare in people younger than 40 years. By age 66, almost everyone has it.  Eating a low-fiber diet.  Being frequently constipated.  Being overweight.  Not getting enough exercise.  Smoking.  Taking over-the-counter pain medicines, like aspirin and ibuprofen. SYMPTOMS  Most people with diverticulosis do not have symptoms. DIAGNOSIS  Because diverticulosis often has no symptoms, health care providers often discover the condition during an exam for other colon problems. In many cases, a health care provider will diagnose diverticulosis while using a flexible scope to examine the colon (colonoscopy). TREATMENT  If you have never developed an infection related to diverticulosis, you may not need treatment. If you  have had an infection before, treatment may include:  Eating more fruits, vegetables, and grains.  Taking a fiber supplement.  Taking a live bacteria supplement (probiotic).  Taking medicine to relax your colon. HOME CARE INSTRUCTIONS   Drink at least 6-8 glasses of water each day to prevent constipation.  Try not to  strain when you have a bowel movement.  Keep all follow-up appointments. If you have had an infection before:  Increase the fiber in your diet as directed by your health care provider or dietitian.  Take a dietary fiber supplement if your health care provider approves.  Only take medicines as directed by your health care provider. SEEK MEDICAL CARE IF:   You have abdominal pain.  You have bloating.  You have cramps.  You have not gone to the bathroom in 3 days. SEEK IMMEDIATE MEDICAL CARE IF:   Your pain gets worse.  Yourbloating becomes very bad.  You have a fever or chills, and your symptoms suddenly get worse.  You begin vomiting.  You have bowel movements that are bloody or black. MAKE SURE YOU:  Understand these instructions.  Will watch your condition.  Will get help right away if you are not doing well or get worse.   This information is not intended to replace advice given to you by your health care provider. Make sure you discuss any questions you have with your health care provider.   High-Fiber Diet Fiber, also called dietary fiber, is a type of carbohydrate found in fruits, vegetables, whole grains, and beans. A high-fiber diet can have many health benefits. Your health care provider may recommend a high-fiber diet to help: Prevent constipation. Fiber can make your bowel movements more regular. Lower your cholesterol. Relieve hemorrhoids, uncomplicated diverticulosis, or irritable bowel syndrome. Prevent overeating as part of a weight-loss plan. Prevent heart disease, type 2 diabetes, and certain cancers. WHAT IS MY PLAN? The recommended daily intake of fiber includes: 38 grams for men under age 53. 52 grams for men over age 27. 8 grams for women under age 80. 13 grams for women over age 16. You can get the recommended daily intake of dietary fiber by eating a variety of fruits, vegetables, grains, and beans. Your health care provider may also  recommend a fiber supplement if it is not possible to get enough fiber through your diet. WHAT DO I NEED TO KNOW ABOUT A HIGH-FIBER DIET? Fiber supplements have not been widely studied for their effectiveness, so it is better to get fiber through food sources. Always check the fiber content on thenutrition facts label of any prepackaged food. Look for foods that contain at least 5 grams of fiber per serving. Ask your dietitian if you have questions about specific foods that are related to your condition, especially if those foods are not listed in the following section. Increase your daily fiber consumption gradually. Increasing your intake of dietary fiber too quickly may cause bloating, cramping, or gas. Drink plenty of water. Water helps you to digest fiber. WHAT FOODS CAN I EAT? Grains Whole-grain breads. Multigrain cereal. Oats and oatmeal. Brown rice. Barley. Bulgur wheat. [place]. Bran muffins. Popcorn. Rye wafer crackers. Vegetables Sweet potatoes. Spinach. Kale. Artichokes. Cabbage. Broccoli. Green peas. Carrots. Squash. Fruits Berries. Pears. Apples. Oranges. Avocados. Prunes and raisins. Dried figs. Meats and Other Protein Sources [place], kidney, pinto, and soy beans. Split peas. Lentils. Nuts and seeds. Dairy Fiber-fortified yogurt. Beverages Fiber-fortified soy milk. Fiber-fortified orange juice. Other Fiber bars. The items listed above  may not be a complete list of recommended foods or beverages. Contact your dietitian for more options. WHAT FOODS ARE NOT RECOMMENDED? Grains White bread. Pasta made with refined flour. White rice. Vegetables Fried potatoes. Canned vegetables. Well-cooked vegetables.  Fruits Fruit juice. Cooked, strained fruit. Meats and Other Protein Sources Fatty cuts of meat. Fried Sales executive or fried fish. Dairy Milk. Yogurt. Cream cheese. Sour cream. Beverages Soft drinks. Other Cakes and pastries. Butter and oils. The items listed above may not be a  complete list of foods and beverages to avoid. Contact your dietitian for more information. WHAT ARE SOME TIPS FOR INCLUDING HIGH-FIBER FOODS IN MY DIET? Eat a wide variety of high-fiber foods. Make sure that half of all grains consumed each day are whole grains. Replace breads and cereals made from refined flour or white flour with whole-grain breads and cereals. Replace white rice with brown rice, bulgur wheat, or millet. Start the day with a breakfast that is high in fiber, such as a cereal that contains at least 5 grams of fiber per serving. Use beans in place of meat in soups, salads, or pasta. Eat high-fiber snacks, such as berries, raw vegetables, nuts, or popcorn.   This information is not intended to replace advice given to you by your health care provider. Make sure you discuss any questions you have with your health care provider.

## 2015-10-28 ENCOUNTER — Encounter (HOSPITAL_COMMUNITY): Payer: Self-pay | Admitting: Internal Medicine

## 2015-11-16 DIAGNOSIS — F329 Major depressive disorder, single episode, unspecified: Secondary | ICD-10-CM | POA: Diagnosis not present

## 2015-11-16 DIAGNOSIS — R42 Dizziness and giddiness: Secondary | ICD-10-CM | POA: Diagnosis not present

## 2015-11-16 DIAGNOSIS — K580 Irritable bowel syndrome with diarrhea: Secondary | ICD-10-CM | POA: Diagnosis not present

## 2015-11-16 DIAGNOSIS — H8110 Benign paroxysmal vertigo, unspecified ear: Secondary | ICD-10-CM | POA: Diagnosis not present

## 2015-11-16 DIAGNOSIS — R531 Weakness: Secondary | ICD-10-CM | POA: Diagnosis not present

## 2015-11-16 DIAGNOSIS — M545 Low back pain: Secondary | ICD-10-CM | POA: Diagnosis not present

## 2015-11-16 DIAGNOSIS — F419 Anxiety disorder, unspecified: Secondary | ICD-10-CM | POA: Diagnosis not present

## 2016-01-10 ENCOUNTER — Encounter (INDEPENDENT_AMBULATORY_CARE_PROVIDER_SITE_OTHER): Payer: Self-pay | Admitting: Internal Medicine

## 2016-01-26 ENCOUNTER — Ambulatory Visit (INDEPENDENT_AMBULATORY_CARE_PROVIDER_SITE_OTHER): Payer: PPO | Admitting: Internal Medicine

## 2016-02-02 ENCOUNTER — Encounter (INDEPENDENT_AMBULATORY_CARE_PROVIDER_SITE_OTHER): Payer: Self-pay | Admitting: Internal Medicine

## 2016-02-02 ENCOUNTER — Ambulatory Visit (INDEPENDENT_AMBULATORY_CARE_PROVIDER_SITE_OTHER): Payer: PPO | Admitting: Internal Medicine

## 2016-02-02 VITALS — BP 162/80 | HR 80 | Temp 98.0°F | Ht 65.6 in | Wt 148.7 lb

## 2016-02-02 DIAGNOSIS — R197 Diarrhea, unspecified: Secondary | ICD-10-CM | POA: Diagnosis not present

## 2016-02-02 NOTE — Patient Instructions (Signed)
Fiber 4 gms. Imodium one in am and one in pm when you have the diarrhea. OV 6 months.

## 2016-02-02 NOTE — Progress Notes (Signed)
Subjective:    Patient ID: Kristy Mckinney, female    DOB: 1931/09/25, 82 y.o.   MRN: [id number]  HPIHere today for f/u after recently undergoing a colonoscopy in April for heme positive stool. She presents today with c/o diarrhea. She says she has diarrhea constantly. The diarrhea started 6-7 months ago and has gradually worsened.  She has tried Dicyclomine which did not help.  She usually as now 4-5 stools a day. Somedays she does not have diarrhea at all.  No recent antibiotics.  Appetite is good. No weight loss.     10/27/2015: Colonoscopy: Heme positive stool Impression:               - The examined portion of the ileum was normal.                           - Diverticulosis in the sigmoid colon.                           - External hemorrhoids.                           - No specimens collected. Moderate Sedation:    Review of Systems Past Medical History:  Diagnosis Date  . Chronic back pain   . Diarrhea   . GERD (gastroesophageal reflux disease)     Past Surgical History:  Procedure Laterality Date  . ABDOMINAL HYSTERECTOMY    . BACK SURGERY    . CHOLECYSTECTOMY N/A 01/29/2014   Procedure: LAPAROSCOPIC CHOLECYSTECTOMY;  Surgeon: Scherry Ran, MD;  Location: [facility];  Service: General;  Laterality: N/A;  . COLONOSCOPY N/A 10/27/2015   Procedure: COLONOSCOPY;  Surgeon: Rogene Houston, MD;  Location: [facility];  Service: Endoscopy;  Laterality: N/A;  215  . TONSILLECTOMY    . YAG LASER APPLICATION Left [id number]   Procedure: YAG LASER APPLICATION;  Surgeon: Elta Guadeloupe T. Gershon Crane, MD;  Location: [facility];  Service: Ophthalmology;  Laterality: Left;  left    Allergies  Allergen Reactions  . Penicillins Anaphylaxis  . Feldene [Piroxicam] Nausea And Vomiting  . Sulfa Antibiotics     swelling    Current Outpatient Prescriptions on File Prior to Visit  Medication Sig Dispense Refill  . ALPRAZolam (XANAX) 1 MG tablet Take 1 mg by mouth at bedtime.    . Biotin  w/ Vitamins C & E (HAIR/SKIN/NAILS PO) Take 1 tablet by mouth daily.    . calcium-vitamin D (OS-CAL 500 + D) 500-200 MG-UNIT per tablet Take 1 tablet by mouth 2 (two) times daily.     Marland Kitchen HYDROcodone-acetaminophen (NORCO) 10-325 MG tablet 1 tablet 2 (two) times daily as needed.    . Multiple Vitamin (MULTIVITAMIN WITH MINERALS) TABS Take 1 tablet by mouth daily.    Marland Kitchen omeprazole (PRILOSEC) 20 MG capsule Take 20 mg by mouth every morning.     Vladimir Faster Glycol-Propyl Glycol (SYSTANE OP) Apply 1 drop to eye daily as needed. Dry Eyes    . sertraline (ZOLOFT) 50 MG tablet Take 50 mg by mouth every morning.     . Eluxadoline (VIBERZI) 100 MG TABS Take by mouth.     No current facility-administered medications on file prior to visit.        Objective:   Physical Exam Blood pressure (!) 162/80, pulse 80, temperature 98 F (36.7 C), height 5'  5.6" (1.666 m), weight 148 lb 11.2 oz (67.4 kg).  Alert and oriented. Skin warm and dry. Oral mucosa is moist.   . Sclera anicteric, conjunctivae is pink. Thyroid not enlarged. No cervical lymphadenopathy. Lungs clear. Heart regular rate and rhythm. Loud murmur heard.   Abdomen is soft. Bowel sounds are positive. No hepatomegaly. No abdominal masses felt. No tenderness.  No edema to lower extremities.           Assessment & Plan:  Diarrhea possible IBS. Fiber 4 gam po daily. Imodium one in am and one  In pm.  Try Benefiber OV in 3 months.

## 2016-04-04 ENCOUNTER — Encounter (INDEPENDENT_AMBULATORY_CARE_PROVIDER_SITE_OTHER): Payer: Self-pay

## 2016-05-03 ENCOUNTER — Encounter (INDEPENDENT_AMBULATORY_CARE_PROVIDER_SITE_OTHER): Payer: Self-pay | Admitting: Internal Medicine

## 2016-05-03 ENCOUNTER — Encounter (INDEPENDENT_AMBULATORY_CARE_PROVIDER_SITE_OTHER): Payer: Self-pay

## 2016-05-03 ENCOUNTER — Ambulatory Visit (INDEPENDENT_AMBULATORY_CARE_PROVIDER_SITE_OTHER): Payer: PPO | Admitting: Internal Medicine

## 2016-05-03 VITALS — BP 164/80 | HR 84 | Temp 98.0°F | Ht 65.0 in | Wt 148.0 lb

## 2016-05-03 DIAGNOSIS — K5909 Other constipation: Secondary | ICD-10-CM | POA: Diagnosis not present

## 2016-05-03 DIAGNOSIS — R1310 Dysphagia, unspecified: Secondary | ICD-10-CM | POA: Diagnosis not present

## 2016-05-03 DIAGNOSIS — R1313 Dysphagia, pharyngeal phase: Secondary | ICD-10-CM

## 2016-05-03 DIAGNOSIS — K5900 Constipation, unspecified: Secondary | ICD-10-CM

## 2016-05-03 DIAGNOSIS — R1319 Other dysphagia: Secondary | ICD-10-CM

## 2016-05-03 NOTE — Patient Instructions (Signed)
She will me know when she is ready for a esophagram.

## 2016-05-03 NOTE — Progress Notes (Signed)
Subjective:    Patient ID: Kristy Mckinney, female    DOB: Jan 14, 1932, 82 y.o.   MRN: [id number]  HPI Here today for f/u. She was last seen in July for diarrhea.  Has tried Dicyclomine which did not help. No recent antibiotics.  She tells me she is doing better. She says her stools are much better. She says her stools are formed unless she eats something she should not. She is having a BM twice a day. No melena or BRRB. Her appetite is good. She has been exercising by walking. She has lost a few pounds which was intentional She says sometimes she has dysphagia to solids x several months. Occurs about once a month. Macroni and cheese and chicken are the worse for causing dysphagia. No acid reflux as long as she takes the Prilosec.     10/27/2015: Colonoscopy: Heme positive stool Impression: - The examined portion of the ileum was normal. - Diverticulosis in the sigmoid colon. - External hemorrhoids. - No specimens collected.  Review of Systems Past Medical History:  Diagnosis Date  . Chronic back pain   . Diarrhea   . GERD (gastroesophageal reflux disease)     Past Surgical History:  Procedure Laterality Date  . ABDOMINAL HYSTERECTOMY    . BACK SURGERY    . CHOLECYSTECTOMY N/A 01/29/2014   Procedure: LAPAROSCOPIC CHOLECYSTECTOMY;  Surgeon: Scherry Ran, MD;  Location: [facility];  Service: General;  Laterality: N/A;  . COLONOSCOPY N/A 10/27/2015   Procedure: COLONOSCOPY;  Surgeon: Rogene Houston, MD;  Location: [facility];  Service: Endoscopy;  Laterality: N/A;  215  . TONSILLECTOMY    . YAG LASER APPLICATION Left [id number]   Procedure: YAG LASER APPLICATION;  Surgeon: Elta Guadeloupe T. Gershon Crane, MD;  Location: [facility];  Service: Ophthalmology;  Laterality: Left;  left    Allergies  Allergen Reactions  . Penicillins Anaphylaxis  . Feldene [Piroxicam] Nausea And Vomiting  . Sulfa  Antibiotics     swelling    Current Outpatient Prescriptions on File Prior to Visit  Medication Sig Dispense Refill  . ALPRAZolam (XANAX) 1 MG tablet Take 1 mg by mouth at bedtime.    . Biotin w/ Vitamins C & E (HAIR/SKIN/NAILS PO) Take 1 tablet by mouth daily.    . calcium-vitamin D (OS-CAL 500 + D) 500-200 MG-UNIT per tablet Take 1 tablet by mouth 2 (two) times daily.     Marland Kitchen HYDROcodone-acetaminophen (NORCO) 10-325 MG tablet 1 tablet 2 (two) times daily as needed.    . Multiple Vitamin (MULTIVITAMIN WITH MINERALS) TABS Take 1 tablet by mouth daily.    Marland Kitchen omeprazole (PRILOSEC) 20 MG capsule Take 20 mg by mouth every morning.     Vladimir Faster Glycol-Propyl Glycol (SYSTANE OP) Apply 1 drop to eye daily as needed. Dry Eyes    . sertraline (ZOLOFT) 50 MG tablet Take 50 mg by mouth every morning.      No current facility-administered medications on file prior to visit.        Objective:   Physical Exam Blood pressure (!) 164/80, pulse 84, temperature 98 F (36.7 C), height 5\' 5"  (1.651 m), weight 148 lb (67.1 kg). Alert and oriented. Skin warm and dry. Oral mucosa is moist.   . Sclera anicteric, conjunctivae is pink. Thyroid not enlarged. No cervical lymphadenopathy. Lungs clear. Heart regular rate and rhythm.  Abdomen is soft. Bowel sounds are positive. No hepatomegaly. No abdominal masses felt. No tenderness.  No edema to lower extremities  Assessment & Plan:  Diarrhea. Resolved. Takes Imodium daily and Benefiber. Dysphagia.  She wants to wait till January before having esophagram.

## 2016-05-04 ENCOUNTER — Ambulatory Visit (INDEPENDENT_AMBULATORY_CARE_PROVIDER_SITE_OTHER): Payer: PPO | Admitting: Internal Medicine

## 2016-05-18 DIAGNOSIS — M545 Low back pain: Secondary | ICD-10-CM | POA: Diagnosis not present

## 2016-05-18 DIAGNOSIS — K210 Gastro-esophageal reflux disease with esophagitis: Secondary | ICD-10-CM | POA: Diagnosis not present

## 2016-05-18 DIAGNOSIS — Z23 Encounter for immunization: Secondary | ICD-10-CM | POA: Diagnosis not present

## 2016-05-18 DIAGNOSIS — K580 Irritable bowel syndrome with diarrhea: Secondary | ICD-10-CM | POA: Diagnosis not present

## 2016-05-18 DIAGNOSIS — I10 Essential (primary) hypertension: Secondary | ICD-10-CM | POA: Diagnosis not present

## 2016-07-11 DIAGNOSIS — M26602 Left temporomandibular joint disorder, unspecified: Secondary | ICD-10-CM | POA: Diagnosis not present

## 2016-07-11 DIAGNOSIS — J209 Acute bronchitis, unspecified: Secondary | ICD-10-CM | POA: Diagnosis not present

## 2016-07-11 DIAGNOSIS — I10 Essential (primary) hypertension: Secondary | ICD-10-CM | POA: Diagnosis not present

## 2016-07-11 DIAGNOSIS — M545 Low back pain: Secondary | ICD-10-CM | POA: Diagnosis not present

## 2017-05-02 DIAGNOSIS — F419 Anxiety disorder, unspecified: Secondary | ICD-10-CM | POA: Diagnosis not present

## 2017-05-02 DIAGNOSIS — M545 Low back pain: Secondary | ICD-10-CM | POA: Diagnosis not present

## 2017-05-02 DIAGNOSIS — Z23 Encounter for immunization: Secondary | ICD-10-CM | POA: Diagnosis not present

## 2017-05-02 DIAGNOSIS — F321 Major depressive disorder, single episode, moderate: Secondary | ICD-10-CM | POA: Diagnosis not present

## 2017-05-02 DIAGNOSIS — I10 Essential (primary) hypertension: Secondary | ICD-10-CM | POA: Diagnosis not present

## 2017-05-11 ENCOUNTER — Other Ambulatory Visit (HOSPITAL_COMMUNITY): Payer: Self-pay | Admitting: Pulmonary Disease

## 2017-05-11 DIAGNOSIS — M545 Low back pain: Secondary | ICD-10-CM

## 2017-05-29 ENCOUNTER — Ambulatory Visit (HOSPITAL_COMMUNITY)
Admission: RE | Admit: 2017-05-29 | Discharge: 2017-05-29 | Disposition: A | Discharge status: Home / Self Care | Payer: PPO | Source: Ambulatory Visit | Attending: Pulmonary Disease | Admitting: Pulmonary Disease

## 2017-05-29 DIAGNOSIS — M48061 Spinal stenosis, lumbar region without neurogenic claudication: Secondary | ICD-10-CM | POA: Diagnosis not present

## 2017-05-29 DIAGNOSIS — M545 Low back pain: Secondary | ICD-10-CM | POA: Diagnosis not present

## 2017-05-29 DIAGNOSIS — M4856XA Collapsed vertebra, not elsewhere classified, lumbar region, initial encounter for fracture: Secondary | ICD-10-CM | POA: Diagnosis not present

## 2017-05-29 DIAGNOSIS — M4186 Other forms of scoliosis, lumbar region: Secondary | ICD-10-CM | POA: Insufficient documentation

## 2017-06-25 ENCOUNTER — Other Ambulatory Visit: Payer: Self-pay | Admitting: Pulmonary Disease

## 2017-06-25 DIAGNOSIS — M545 Low back pain: Principal | ICD-10-CM

## 2017-06-25 DIAGNOSIS — G8929 Other chronic pain: Secondary | ICD-10-CM

## 2017-07-06 ENCOUNTER — Other Ambulatory Visit: Payer: PPO

## 2017-07-18 ENCOUNTER — Other Ambulatory Visit: Payer: PPO

## 2017-07-25 ENCOUNTER — Ambulatory Visit
Admission: RE | Admit: 2017-07-25 | Discharge: 2017-07-25 | Disposition: A | Discharge status: Home / Self Care | Payer: PPO | Source: Ambulatory Visit | Attending: Pulmonary Disease | Admitting: Pulmonary Disease

## 2017-07-25 DIAGNOSIS — M47817 Spondylosis without myelopathy or radiculopathy, lumbosacral region: Secondary | ICD-10-CM | POA: Diagnosis not present

## 2017-07-25 DIAGNOSIS — G8929 Other chronic pain: Secondary | ICD-10-CM

## 2017-07-25 DIAGNOSIS — M545 Low back pain: Principal | ICD-10-CM

## 2017-07-25 MED ORDER — IOPAMIDOL (ISOVUE-M 200) INJECTION 41%
1.0000 mL | Freq: Once | INTRAMUSCULAR | Status: AC
  Administered 2017-07-25: 1 mL via EPIDURAL

## 2017-07-25 MED ORDER — METHYLPREDNISOLONE ACETATE 40 MG/ML INJ SUSP (RADIOLOG
120.0000 mg | Freq: Once | INTRAMUSCULAR | Status: AC
  Administered 2017-07-25: 120 mg via EPIDURAL

## 2017-07-25 NOTE — Discharge Instructions (Signed)

## 2017-09-27 ENCOUNTER — Other Ambulatory Visit: Payer: Self-pay | Admitting: Pulmonary Disease

## 2017-09-27 DIAGNOSIS — M545 Low back pain: Secondary | ICD-10-CM

## 2017-10-31 DIAGNOSIS — M545 Low back pain: Secondary | ICD-10-CM | POA: Diagnosis not present

## 2017-10-31 DIAGNOSIS — D649 Anemia, unspecified: Secondary | ICD-10-CM | POA: Diagnosis not present

## 2017-10-31 DIAGNOSIS — L989 Disorder of the skin and subcutaneous tissue, unspecified: Secondary | ICD-10-CM | POA: Diagnosis not present

## 2017-10-31 DIAGNOSIS — I10 Essential (primary) hypertension: Secondary | ICD-10-CM | POA: Diagnosis not present

## 2017-11-07 ENCOUNTER — Ambulatory Visit
Admission: RE | Admit: 2017-11-07 | Discharge: 2017-11-07 | Disposition: A | Discharge status: Home / Self Care | Payer: PPO | Source: Ambulatory Visit | Attending: Pulmonary Disease | Admitting: Pulmonary Disease

## 2017-11-07 DIAGNOSIS — M545 Low back pain: Secondary | ICD-10-CM

## 2017-11-07 DIAGNOSIS — M47817 Spondylosis without myelopathy or radiculopathy, lumbosacral region: Secondary | ICD-10-CM | POA: Diagnosis not present

## 2017-11-07 MED ORDER — METHYLPREDNISOLONE ACETATE 40 MG/ML INJ SUSP (RADIOLOG
120.0000 mg | Freq: Once | INTRAMUSCULAR | Status: AC
  Administered 2017-11-07: 120 mg via EPIDURAL

## 2017-11-07 MED ORDER — IOPAMIDOL (ISOVUE-M 200) INJECTION 41%
1.0000 mL | Freq: Once | INTRAMUSCULAR | Status: AC
  Administered 2017-11-07: 1 mL via EPIDURAL

## 2017-11-07 NOTE — Discharge Instructions (Signed)

## 2017-11-19 DIAGNOSIS — C44612 Basal cell carcinoma of skin of right upper limb, including shoulder: Secondary | ICD-10-CM | POA: Diagnosis not present

## 2017-11-19 DIAGNOSIS — C44722 Squamous cell carcinoma of skin of right lower limb, including hip: Secondary | ICD-10-CM | POA: Diagnosis not present

## 2017-11-19 DIAGNOSIS — L821 Other seborrheic keratosis: Secondary | ICD-10-CM | POA: Diagnosis not present

## 2017-12-25 DIAGNOSIS — Z08 Encounter for follow-up examination after completed treatment for malignant neoplasm: Secondary | ICD-10-CM | POA: Diagnosis not present

## 2017-12-25 DIAGNOSIS — Z85828 Personal history of other malignant neoplasm of skin: Secondary | ICD-10-CM | POA: Diagnosis not present

## 2018-03-08 DIAGNOSIS — L989 Disorder of the skin and subcutaneous tissue, unspecified: Secondary | ICD-10-CM | POA: Diagnosis not present

## 2018-03-08 DIAGNOSIS — D649 Anemia, unspecified: Secondary | ICD-10-CM | POA: Diagnosis not present

## 2018-03-08 DIAGNOSIS — M545 Low back pain: Secondary | ICD-10-CM | POA: Diagnosis not present

## 2018-03-08 DIAGNOSIS — I10 Essential (primary) hypertension: Secondary | ICD-10-CM | POA: Diagnosis not present

## 2018-03-08 LAB — LIPID PANEL
Cholesterol: 186 (ref 0–200)
HDL: 63 (ref 35–70)
LDL Cholesterol: 106
Triglycerides: 83 (ref 40–160)

## 2018-03-08 LAB — TSH: TSH: 4.61 (ref <=5.90)

## 2018-03-08 LAB — BASIC METABOLIC PANEL
BUN: 21 (ref 4–21)
Creatinine: 0.9 (ref <=1.1)
Glucose: 101

## 2018-03-08 LAB — IRON,TIBC AND FERRITIN PANEL: Ferritin: 23

## 2018-03-13 ENCOUNTER — Other Ambulatory Visit (HOSPITAL_COMMUNITY): Payer: Self-pay | Admitting: Pulmonary Disease

## 2018-03-13 DIAGNOSIS — Z23 Encounter for immunization: Secondary | ICD-10-CM | POA: Diagnosis not present

## 2018-03-13 DIAGNOSIS — Z0000 Encounter for general adult medical examination without abnormal findings: Secondary | ICD-10-CM | POA: Diagnosis not present

## 2018-03-13 DIAGNOSIS — Z780 Asymptomatic menopausal state: Secondary | ICD-10-CM

## 2018-03-20 DIAGNOSIS — Z1211 Encounter for screening for malignant neoplasm of colon: Secondary | ICD-10-CM | POA: Diagnosis not present

## 2018-03-20 LAB — FECAL OCCULT BLOOD, GUAIAC: Fecal Occult Blood: NEGATIVE

## 2018-03-21 ENCOUNTER — Ambulatory Visit (HOSPITAL_COMMUNITY)
Admission: RE | Admit: 2018-03-21 | Discharge: 2018-03-21 | Disposition: A | Discharge status: Home / Self Care | Payer: PPO | Source: Ambulatory Visit | Attending: Pulmonary Disease | Admitting: Pulmonary Disease

## 2018-03-21 DIAGNOSIS — Z780 Asymptomatic menopausal state: Secondary | ICD-10-CM | POA: Insufficient documentation

## 2018-03-21 DIAGNOSIS — M8588 Other specified disorders of bone density and structure, other site: Secondary | ICD-10-CM | POA: Diagnosis not present

## 2018-03-21 DIAGNOSIS — M810 Age-related osteoporosis without current pathological fracture: Secondary | ICD-10-CM | POA: Diagnosis not present

## 2018-03-21 LAB — HM DEXA SCAN

## 2018-05-30 DIAGNOSIS — H52201 Unspecified astigmatism, right eye: Secondary | ICD-10-CM | POA: Diagnosis not present

## 2018-05-30 DIAGNOSIS — H5213 Myopia, bilateral: Secondary | ICD-10-CM | POA: Diagnosis not present

## 2018-05-30 DIAGNOSIS — H524 Presbyopia: Secondary | ICD-10-CM | POA: Diagnosis not present

## 2018-05-30 DIAGNOSIS — Z961 Presence of intraocular lens: Secondary | ICD-10-CM | POA: Diagnosis not present

## 2018-06-12 DIAGNOSIS — F419 Anxiety disorder, unspecified: Secondary | ICD-10-CM | POA: Diagnosis not present

## 2018-06-12 DIAGNOSIS — D509 Iron deficiency anemia, unspecified: Secondary | ICD-10-CM | POA: Diagnosis not present

## 2018-06-12 DIAGNOSIS — M545 Low back pain: Secondary | ICD-10-CM | POA: Diagnosis not present

## 2018-06-12 DIAGNOSIS — I10 Essential (primary) hypertension: Secondary | ICD-10-CM | POA: Diagnosis not present

## 2018-06-19 DIAGNOSIS — M545 Low back pain: Secondary | ICD-10-CM | POA: Diagnosis not present

## 2018-06-19 DIAGNOSIS — F419 Anxiety disorder, unspecified: Secondary | ICD-10-CM | POA: Diagnosis not present

## 2018-06-19 DIAGNOSIS — D509 Iron deficiency anemia, unspecified: Secondary | ICD-10-CM | POA: Diagnosis not present

## 2018-06-19 DIAGNOSIS — I10 Essential (primary) hypertension: Secondary | ICD-10-CM | POA: Diagnosis not present

## 2018-06-19 LAB — IRON,TIBC AND FERRITIN PANEL
%SAT: 10
Iron: 35

## 2018-09-02 ENCOUNTER — Other Ambulatory Visit (HOSPITAL_COMMUNITY): Payer: Self-pay | Admitting: Pulmonary Disease

## 2018-09-02 ENCOUNTER — Ambulatory Visit (HOSPITAL_COMMUNITY)
Admission: RE | Admit: 2018-09-02 | Discharge: 2018-09-02 | Disposition: A | Discharge status: Home / Self Care | Payer: PPO | Source: Ambulatory Visit | Attending: Pulmonary Disease | Admitting: Pulmonary Disease

## 2018-09-02 DIAGNOSIS — I10 Essential (primary) hypertension: Secondary | ICD-10-CM | POA: Diagnosis not present

## 2018-09-02 DIAGNOSIS — S62111A Displaced fracture of triquetrum [cuneiform] bone, right wrist, initial encounter for closed fracture: Secondary | ICD-10-CM | POA: Diagnosis not present

## 2018-09-02 DIAGNOSIS — M25531 Pain in right wrist: Secondary | ICD-10-CM

## 2018-09-02 DIAGNOSIS — M545 Low back pain: Secondary | ICD-10-CM | POA: Diagnosis not present

## 2018-09-02 DIAGNOSIS — S6290XA Unspecified fracture of unspecified wrist and hand, initial encounter for closed fracture: Secondary | ICD-10-CM | POA: Diagnosis not present

## 2018-09-02 DIAGNOSIS — M79641 Pain in right hand: Secondary | ICD-10-CM | POA: Diagnosis not present

## 2018-09-02 DIAGNOSIS — M810 Age-related osteoporosis without current pathological fracture: Secondary | ICD-10-CM | POA: Diagnosis not present

## 2018-09-03 ENCOUNTER — Encounter: Payer: Self-pay | Admitting: Orthopaedic Surgery

## 2018-09-03 ENCOUNTER — Ambulatory Visit: Payer: PPO | Admitting: Orthopaedic Surgery

## 2018-09-03 VITALS — BP 126/68 | HR 77 | Ht 63.5 in | Wt 140.0 lb

## 2018-09-03 DIAGNOSIS — S62114A Nondisplaced fracture of triquetrum [cuneiform] bone, right wrist, initial encounter for closed fracture: Secondary | ICD-10-CM

## 2018-09-03 NOTE — Progress Notes (Signed)
Subjective:    Patient ID: Kristy Mckinney, female    DOB: 10/01/1931, 85 y.o.   MRN: [id number]  HPI She got her right hand caught up in her bed sheets and fell and hurt her right wrist on 09-02-2018.  She was seen in the ER.  X-rays were done of the right wrist showing: IMPRESSION: 1. Dorsal triquetrum fracture with overlying soft tissue swelling.  She was placed in small cock-up splint.  She had no other injuries. I have reviewed the x-rays and the ER report.  She is doing well.    I will change to a longer more substantial splint today.   Review of Systems  Constitutional: Positive for activity change.  Musculoskeletal: Positive for arthralgias, back pain and joint swelling.  All other systems reviewed and are negative.  For Review of Systems, all other systems reviewed and are negative.  The following is a summary of the past history medically, past history surgically, known current medicines, social history and family history.  This information is gathered electronically by the computer from prior information and documentation.  I review this each visit and have found including this information at this point in the chart is beneficial and informative.   Past Medical History:  Diagnosis Date  . Chronic back pain   . Diarrhea   . Dysphagia 05/03/2016  . GERD (gastroesophageal reflux disease)     Past Surgical History:  Procedure Laterality Date  . ABDOMINAL HYSTERECTOMY    . BACK SURGERY    . CHOLECYSTECTOMY N/A 01/29/2014   Procedure: LAPAROSCOPIC CHOLECYSTECTOMY;  Surgeon: Scherry Ran, MD;  Location: [facility];  Service: General;  Laterality: N/A;  . COLONOSCOPY N/A 10/27/2015   Procedure: COLONOSCOPY;  Surgeon: Rogene Houston, MD;  Location: [facility];  Service: Endoscopy;  Laterality: N/A;  215  . TONSILLECTOMY    . YAG LASER APPLICATION Left [id number]   Procedure: YAG LASER APPLICATION;  Surgeon: Elta Guadeloupe T. Gershon Crane, MD;  Location: [facility];  Service:  Ophthalmology;  Laterality: Left;  left    Current Outpatient Medications on File Prior to Visit  Medication Sig Dispense Refill  . alendronate (FOSAMAX) 70 MG tablet Take 70 mg by mouth once a week. Take with a full glass of water on an empty stomach.    . ALPRAZolam (XANAX) 1 MG tablet Take 1 mg by mouth at bedtime.    . Biotin w/ Vitamins C & E (HAIR/SKIN/NAILS PO) Take 1 tablet by mouth daily.    . calcium-vitamin D (OS-CAL 500 + D) 500-200 MG-UNIT per tablet Take 1 tablet by mouth 2 (two) times daily.     . Multiple Vitamin (MULTIVITAMIN WITH MINERALS) TABS Take 1 tablet by mouth daily.    Marland Kitchen omeprazole (PRILOSEC) 20 MG capsule Take 20 mg by mouth every morning.     Vladimir Faster Glycol-Propyl Glycol (SYSTANE OP) Apply 1 drop to eye daily as needed. Dry Eyes    . Wheat Dextrin (BENEFIBER PO) Take by mouth.    . diphenoxylate-atropine (LOMOTIL) 2.5-0.025 MG tablet Take by mouth 4 (four) times daily as needed for diarrhea or loose stools.    Marland Kitchen HYDROcodone-acetaminophen (NORCO) 10-325 MG tablet 1 tablet 2 (two) times daily as needed.    . loperamide (IMODIUM) 2 MG capsule Take by mouth as needed for diarrhea or loose stools.    . sertraline (ZOLOFT) 50 MG tablet Take 50 mg by mouth every morning.      No current facility-administered medications on  file prior to visit.     Social History   Socioeconomic History  . Marital status: Married    Spouse name: Not on file  . Number of children: Not on file  . Years of education: Not on file  . Highest education level: Not on file  Occupational History  . Not on file  Social Needs  . Financial resource strain: Not on file  . Food insecurity:    Worry: Not on file    Inability: Not on file  . Transportation needs:    Medical: Not on file    Non-medical: Not on file  Tobacco Use  . Smoking status: Never Smoker  . Smokeless tobacco: Never Used  Substance and Sexual Activity  . Alcohol use: No  . Drug use: No  . Sexual activity: Yes      Birth control/protection: Surgical  Lifestyle  . Physical activity:    Days per week: Not on file    Minutes per session: Not on file  . Stress: Not on file  Relationships  . Social connections:    Talks on phone: Not on file    Gets together: Not on file    Attends religious service: Not on file    Active member of club or organization: Not on file    Attends meetings of clubs or organizations: Not on file    Relationship status: Not on file  . Intimate partner violence:    Fear of current or ex partner: Not on file    Emotionally abused: Not on file    Physically abused: Not on file    Forced sexual activity: Not on file  Other Topics Concern  . Not on file  Social History Narrative  . Not on file    History reviewed. No pertinent family history.  BP 126/68   Pulse 77   Ht 5' 3.5" (1.613 m)   Wt 140 lb (63.5 kg)   BMI 24.41 kg/m   Body mass index is 24.41 kg/m.      Objective:   Physical Exam Constitutional:      Appearance: She is well-developed.  HENT:     Head: Normocephalic and atraumatic.  Eyes:     Conjunctiva/sclera: Conjunctivae normal.     Pupils: Pupils are equal, round, and reactive to light.  Neck:     Musculoskeletal: Normal range of motion and neck supple.  Cardiovascular:     Rate and Rhythm: Normal rate and regular rhythm.  Pulmonary:     Effort: Pulmonary effort is normal.  Abdominal:     Palpations: Abdomen is soft.  Musculoskeletal:     Right wrist: She exhibits decreased range of motion and tenderness.       Arms:  Skin:    General: Skin is warm and dry.  Neurological:     Mental Status: She is alert and oriented to person, place, and time.     Cranial Nerves: No cranial nerve deficit.     Motor: No abnormal muscle tone.     Coordination: Coordination normal.     Deep Tendon Reflexes: Reflexes are normal and symmetric. Reflexes normal.  Psychiatric:        Behavior: Behavior normal.        Thought Content: Thought content  normal.        Judgment: Judgment normal.           Assessment & Plan:   Encounter Diagnosis  Name Primary?  . Closed nondisplaced fracture of  triquetrum of right wrist, initial encounter Yes   I have explained how to use the new cock-up splint.  Return in two weeks.  X-rays on return. Call if any problem.  Precautions discussed.   Electronically Signed Sanjuana Kava, MD 2/25/20209:44 AM

## 2018-09-17 ENCOUNTER — Ambulatory Visit: Payer: PPO | Admitting: Orthopaedic Surgery

## 2018-09-19 ENCOUNTER — Ambulatory Visit: Payer: PPO | Admitting: Orthopaedic Surgery

## 2018-09-24 ENCOUNTER — Ambulatory Visit (INDEPENDENT_AMBULATORY_CARE_PROVIDER_SITE_OTHER): Payer: PPO

## 2018-09-24 ENCOUNTER — Other Ambulatory Visit: Payer: Self-pay

## 2018-09-24 ENCOUNTER — Encounter: Payer: Self-pay | Admitting: Orthopaedic Surgery

## 2018-09-24 ENCOUNTER — Ambulatory Visit (INDEPENDENT_AMBULATORY_CARE_PROVIDER_SITE_OTHER): Payer: PPO | Admitting: Orthopaedic Surgery

## 2018-09-24 DIAGNOSIS — S62114D Nondisplaced fracture of triquetrum [cuneiform] bone, right wrist, subsequent encounter for fracture with routine healing: Secondary | ICD-10-CM | POA: Diagnosis not present

## 2018-09-24 NOTE — Progress Notes (Signed)
CC:  My wrist is much better  She has little pain of the right wrist.  She has been using a cock-up splint.    NV intact.  ROM full, not painful.  X-rays were done of the right wrist, reported separately.  Encounter Diagnosis  Name Primary?  . Closed nondisplaced fracture of triquetrum of right wrist with routine healing, subsequent encounter Yes   Come out of the splint as tolerated.  Return here in one month.  X-rays then.  Call if any problem.  Precautions discussed.   Electronically Signed Sanjuana Kava, MD 3/17/202010:18 AM

## 2018-10-24 ENCOUNTER — Ambulatory Visit (INDEPENDENT_AMBULATORY_CARE_PROVIDER_SITE_OTHER): Payer: PPO | Admitting: Orthopaedic Surgery

## 2018-10-24 ENCOUNTER — Encounter: Payer: Self-pay | Admitting: Orthopaedic Surgery

## 2018-10-24 ENCOUNTER — Other Ambulatory Visit: Payer: Self-pay

## 2018-10-24 DIAGNOSIS — S62114D Nondisplaced fracture of triquetrum [cuneiform] bone, right wrist, subsequent encounter for fracture with routine healing: Secondary | ICD-10-CM

## 2018-10-24 NOTE — Progress Notes (Signed)
Virtual Visit via Telephone Note  I connected with Kristy Mckinney on 10/24/18 at  9:20 AM EDT by telephone and verified that I am speaking with the correct person using two identifiers.   I discussed the limitations, risks, security and privacy concerns of performing an evaluation and management service by telephone and the availability of in person appointments. I also discussed with the patient that there may be a patient responsible charge related to this service. The patient expressed understanding and agreed to proceed.   History of Present Illness: She is doing well, no problems with the right wrist.  She has resumed normal activities.   Observations/Objective: Per above  Assessment and Plan: Encounter Diagnosis  Name Primary?  . Closed nondisplaced fracture of triquetrum of right wrist with routine healing, subsequent encounter Yes     Follow Up Instructions: Discharge   I discussed the assessment and treatment plan with the patient. The patient was provided an opportunity to ask questions and all were answered. The patient agreed with the plan and demonstrated an understanding of the instructions.   The patient was advised to call back or seek an in-person evaluation if the symptoms worsen or if the condition fails to improve as anticipated.  I provided 4 minutes of non-face-to-face time during this encounter.   Sanjuana Kava, MD

## 2018-10-29 ENCOUNTER — Ambulatory Visit (HOSPITAL_COMMUNITY)
Admission: RE | Admit: 2018-10-29 | Discharge: 2018-10-29 | Disposition: A | Discharge status: Home / Self Care | Payer: PPO | Source: Ambulatory Visit | Attending: Pulmonary Disease | Admitting: Pulmonary Disease

## 2018-10-29 ENCOUNTER — Other Ambulatory Visit: Payer: Self-pay

## 2018-10-29 ENCOUNTER — Other Ambulatory Visit (HOSPITAL_COMMUNITY): Payer: Self-pay | Admitting: Pulmonary Disease

## 2018-10-29 DIAGNOSIS — M25531 Pain in right wrist: Secondary | ICD-10-CM | POA: Diagnosis not present

## 2018-10-29 DIAGNOSIS — R609 Edema, unspecified: Secondary | ICD-10-CM

## 2018-11-14 ENCOUNTER — Other Ambulatory Visit (HOSPITAL_COMMUNITY): Payer: Self-pay | Admitting: Pulmonary Disease

## 2018-11-14 ENCOUNTER — Other Ambulatory Visit: Payer: Self-pay

## 2018-11-14 ENCOUNTER — Ambulatory Visit (HOSPITAL_COMMUNITY)
Admission: RE | Admit: 2018-11-14 | Discharge: 2018-11-14 | Disposition: A | Discharge status: Home / Self Care | Payer: PPO | Source: Ambulatory Visit | Attending: Pulmonary Disease | Admitting: Pulmonary Disease

## 2018-11-14 DIAGNOSIS — M25552 Pain in left hip: Secondary | ICD-10-CM | POA: Insufficient documentation

## 2018-11-24 ENCOUNTER — Other Ambulatory Visit: Payer: Self-pay | Admitting: Diagnostic Radiology

## 2018-11-24 ENCOUNTER — Encounter (HOSPITAL_COMMUNITY): Payer: Self-pay

## 2018-11-24 ENCOUNTER — Emergency Department (HOSPITAL_COMMUNITY): Payer: PPO

## 2018-11-24 ENCOUNTER — Other Ambulatory Visit: Payer: Self-pay

## 2018-11-24 ENCOUNTER — Other Ambulatory Visit (HOSPITAL_COMMUNITY): Payer: PPO

## 2018-11-24 ENCOUNTER — Inpatient Hospital Stay (HOSPITAL_COMMUNITY)
Admission: EM | Admit: 2018-11-24 | Discharge: 2018-11-27 | DRG: 309 | Disposition: A | Discharge status: Home Health | Payer: PPO | Attending: Pulmonary Disease | Admitting: Pulmonary Disease

## 2018-11-24 DIAGNOSIS — I509 Heart failure, unspecified: Secondary | ICD-10-CM | POA: Diagnosis not present

## 2018-11-24 DIAGNOSIS — Z880 Allergy status to penicillin: Secondary | ICD-10-CM

## 2018-11-24 DIAGNOSIS — I4891 Unspecified atrial fibrillation: Principal | ICD-10-CM | POA: Diagnosis present

## 2018-11-24 DIAGNOSIS — J811 Chronic pulmonary edema: Secondary | ICD-10-CM | POA: Diagnosis not present

## 2018-11-24 DIAGNOSIS — G8929 Other chronic pain: Secondary | ICD-10-CM

## 2018-11-24 DIAGNOSIS — F329 Major depressive disorder, single episode, unspecified: Secondary | ICD-10-CM | POA: Diagnosis present

## 2018-11-24 DIAGNOSIS — E876 Hypokalemia: Secondary | ICD-10-CM | POA: Diagnosis present

## 2018-11-24 DIAGNOSIS — I340 Nonrheumatic mitral (valve) insufficiency: Secondary | ICD-10-CM | POA: Diagnosis not present

## 2018-11-24 DIAGNOSIS — M549 Dorsalgia, unspecified: Secondary | ICD-10-CM | POA: Diagnosis not present

## 2018-11-24 DIAGNOSIS — R296 Repeated falls: Secondary | ICD-10-CM | POA: Diagnosis not present

## 2018-11-24 DIAGNOSIS — Z7983 Long term (current) use of bisphosphonates: Secondary | ICD-10-CM

## 2018-11-24 DIAGNOSIS — Z882 Allergy status to sulfonamides status: Secondary | ICD-10-CM | POA: Diagnosis not present

## 2018-11-24 DIAGNOSIS — Z20828 Contact with and (suspected) exposure to other viral communicable diseases: Secondary | ICD-10-CM | POA: Diagnosis present

## 2018-11-24 DIAGNOSIS — I361 Nonrheumatic tricuspid (valve) insufficiency: Secondary | ICD-10-CM | POA: Diagnosis not present

## 2018-11-24 DIAGNOSIS — J90 Pleural effusion, not elsewhere classified: Secondary | ICD-10-CM | POA: Diagnosis present

## 2018-11-24 DIAGNOSIS — R4182 Altered mental status, unspecified: Secondary | ICD-10-CM | POA: Diagnosis not present

## 2018-11-24 DIAGNOSIS — M810 Age-related osteoporosis without current pathological fracture: Secondary | ICD-10-CM | POA: Diagnosis not present

## 2018-11-24 DIAGNOSIS — F419 Anxiety disorder, unspecified: Secondary | ICD-10-CM | POA: Diagnosis present

## 2018-11-24 DIAGNOSIS — I517 Cardiomegaly: Secondary | ICD-10-CM | POA: Diagnosis not present

## 2018-11-24 DIAGNOSIS — R42 Dizziness and giddiness: Secondary | ICD-10-CM | POA: Diagnosis not present

## 2018-11-24 DIAGNOSIS — I059 Rheumatic mitral valve disease, unspecified: Secondary | ICD-10-CM | POA: Diagnosis not present

## 2018-11-24 DIAGNOSIS — M6281 Muscle weakness (generalized): Secondary | ICD-10-CM | POA: Diagnosis not present

## 2018-11-24 DIAGNOSIS — Z888 Allergy status to other drugs, medicaments and biological substances status: Secondary | ICD-10-CM

## 2018-11-24 DIAGNOSIS — J810 Acute pulmonary edema: Secondary | ICD-10-CM | POA: Diagnosis not present

## 2018-11-24 DIAGNOSIS — R269 Unspecified abnormalities of gait and mobility: Secondary | ICD-10-CM | POA: Diagnosis not present

## 2018-11-24 DIAGNOSIS — K219 Gastro-esophageal reflux disease without esophagitis: Secondary | ICD-10-CM | POA: Diagnosis present

## 2018-11-24 DIAGNOSIS — I4819 Other persistent atrial fibrillation: Secondary | ICD-10-CM | POA: Diagnosis not present

## 2018-11-24 LAB — COMPREHENSIVE METABOLIC PANEL
ALT: 20 U/L (ref 0–44)
AST: 30 U/L (ref 15–41)
Albumin: 4.1 g/dL (ref 3.5–5.0)
Alkaline Phosphatase: 59 U/L (ref 38–126)
Anion gap: 14 (ref 5–15)
BUN: 18 mg/dL (ref 8–23)
CO2: 25 mmol/L (ref 22–32)
Calcium: 9.5 mg/dL (ref 8.9–10.3)
Chloride: 103 mmol/L (ref 98–111)
Creatinine, Ser: 0.82 mg/dL (ref 0.44–1.00)
GFR calc Af Amer: >60 mL/min (ref >60)
GFR calc non Af Amer: >60 mL/min (ref >60)
Glucose, Bld: 102 mg/dL — ABNORMAL HIGH (ref 70–99)
Potassium: 3.7 mmol/L (ref 3.5–5.1)
Sodium: 142 mmol/L (ref 135–145)
Total Bilirubin: 2.2 mg/dL — ABNORMAL HIGH (ref 0.3–1.2)
Total Protein: 7.1 g/dL (ref 6.5–8.1)

## 2018-11-24 LAB — CBC WITH DIFFERENTIAL/PLATELET
Abs Immature Granulocytes: 0.02 10*3/uL (ref 0.00–0.07)
Basophils Absolute: 0.1 10*3/uL (ref 0.0–0.1)
Basophils Relative: 1 %
Eosinophils Absolute: 0.1 10*3/uL (ref 0.0–0.5)
Eosinophils Relative: 1 %
HCT: 40 % (ref 36.0–46.0)
Hemoglobin: 12.5 g/dL (ref 12.0–15.0)
Immature Granulocytes: 0 %
Lymphocytes Relative: 16 %
Lymphs Abs: 1.1 10*3/uL (ref 0.7–4.0)
MCH: 26.7 pg (ref 26.0–34.0)
MCHC: 31.3 g/dL (ref 30.0–36.0)
MCV: 85.3 fL (ref 80.0–100.0)
Monocytes Absolute: 0.4 10*3/uL (ref 0.1–1.0)
Monocytes Relative: 6 %
Neutro Abs: 5.2 10*3/uL (ref 1.7–7.7)
Neutrophils Relative %: 76 %
Platelets: 256 10*3/uL (ref 150–400)
RBC: 4.69 MIL/uL (ref 3.87–5.11)
RDW: 16.6 % — ABNORMAL HIGH (ref 11.5–15.5)
WBC: 6.9 10*3/uL (ref 4.0–10.5)
nRBC: 0 % (ref 0.0–0.2)

## 2018-11-24 LAB — TROPONIN I: Troponin I: <0.03 ng/mL (ref <0.03)

## 2018-11-24 LAB — SARS CORONAVIRUS 2 BY RT PCR (HOSPITAL ORDER, PERFORMED IN ~~LOC~~ HOSPITAL LAB): SARS Coronavirus 2: NEGATIVE

## 2018-11-24 MED ORDER — SODIUM CHLORIDE 0.9% FLUSH
3.0000 mL | Freq: Two times a day (BID) | INTRAVENOUS | Status: DC
  Administered 2018-11-24 – 2018-11-26 (×4): 3 mL via INTRAVENOUS

## 2018-11-24 MED ORDER — HYDROCODONE-ACETAMINOPHEN 10-325 MG PO TABS
1.0000 | ORAL_TABLET | Freq: Two times a day (BID) | ORAL | Status: DC | PRN
  Administered 2018-11-25: 1 via ORAL
  Filled 2018-11-24: qty 1

## 2018-11-24 MED ORDER — ENOXAPARIN SODIUM 80 MG/0.8ML ~~LOC~~ SOLN
1.0000 mg/kg | Freq: Two times a day (BID) | SUBCUTANEOUS | Status: DC
  Administered 2018-11-24 – 2018-11-26 (×4): 65 mg via SUBCUTANEOUS
  Filled 2018-11-24 (×4): qty 0.8

## 2018-11-24 MED ORDER — DILTIAZEM HCL 100 MG IV SOLR
5.0000 mg/h | INTRAVENOUS | Status: DC
  Administered 2018-11-24: 10 mg/h via INTRAVENOUS
  Administered 2018-11-24: 12:00:00 5 mg/h via INTRAVENOUS
  Filled 2018-11-24 (×2): qty 100

## 2018-11-24 MED ORDER — SODIUM CHLORIDE 0.9 % IV SOLN: 250.0000 mL | INTRAVENOUS | Status: DC | PRN

## 2018-11-24 MED ORDER — ALPRAZOLAM 0.5 MG PO TABS
1.0000 mg | ORAL_TABLET | Freq: Every day | ORAL | Status: DC
  Administered 2018-11-24 – 2018-11-25 (×2): 1 mg via ORAL
  Filled 2018-11-24 (×2): qty 2

## 2018-11-24 MED ORDER — ACETAMINOPHEN 325 MG PO TABS: 650.0000 mg | ORAL_TABLET | Freq: Four times a day (QID) | ORAL | Status: DC | PRN

## 2018-11-24 MED ORDER — DILTIAZEM LOAD VIA INFUSION
5.0000 mg | Freq: Once | INTRAVENOUS | Status: AC
  Administered 2018-11-24: 5 mg via INTRAVENOUS
  Filled 2018-11-24: qty 5

## 2018-11-24 MED ORDER — PANTOPRAZOLE SODIUM 40 MG PO TBEC
40.0000 mg | DELAYED_RELEASE_TABLET | Freq: Every day | ORAL | Status: DC
  Administered 2018-11-24 – 2018-11-27 (×4): 40 mg via ORAL
  Filled 2018-11-24 (×4): qty 1

## 2018-11-24 MED ORDER — ACETAMINOPHEN 650 MG RE SUPP: 650.0000 mg | Freq: Four times a day (QID) | RECTAL | Status: DC | PRN

## 2018-11-24 MED ORDER — SODIUM CHLORIDE 0.9% FLUSH
3.0000 mL | INTRAVENOUS | Status: DC | PRN
  Administered 2018-11-24: 23:00:00 3 mL via INTRAVENOUS
  Filled 2018-11-24: qty 3

## 2018-11-24 MED ORDER — CHLORHEXIDINE GLUCONATE CLOTH 2 % EX PADS
6.0000 | MEDICATED_PAD | Freq: Every day | CUTANEOUS | Status: DC
  Administered 2018-11-24 – 2018-11-27 (×4): 6 via TOPICAL

## 2018-11-24 MED ORDER — SERTRALINE HCL 50 MG PO TABS
50.0000 mg | ORAL_TABLET | Freq: Every morning | ORAL | Status: DC
  Administered 2018-11-24 – 2018-11-27 (×4): 50 mg via ORAL
  Filled 2018-11-24 (×4): qty 1

## 2018-11-24 NOTE — H&P (Addendum)
History and Physical    Kristy Mckinney  IRC:[id number]  DOB: 02-08-1932  DOA: 11/24/2018 PCP: [name], MD   Patient coming from: home  Chief Complaint: dizziness  HPI: Kristy Mckinney is a 85 y.o. female with medical history of chronic back pain who states she is here for dizziness. She has not had any other neurological symptoms but states she is getting very dizzy when she walks and needs to hold on to things in her apartment to keep from falling. She has fallen about 4 times in the past 4-5 weeks mainly because of this dizziness. The other night when she laid down to bed, she felt like she was short of breath and could not catch her breath. She has not noted any palpitations or chest pain. She is found to have A-fib.   ED Course: A-fib with rate of 140s on arrival. Cardizem infusion started.   Review of Systems:  All other systems reviewed and apart from HPI, are negative.  Past Medical History:  Diagnosis Date  . Chronic back pain   . Diarrhea   . Dysphagia 05/03/2016  . GERD (gastroesophageal reflux disease)     Past Surgical History:  Procedure Laterality Date  . ABDOMINAL HYSTERECTOMY    . BACK SURGERY    . CHOLECYSTECTOMY N/A 01/29/2014   Procedure: LAPAROSCOPIC CHOLECYSTECTOMY;  Surgeon: Scherry Ran, MD;  Location: [facility];  Service: General;  Laterality: N/A;  . COLONOSCOPY N/A 10/27/2015   Procedure: COLONOSCOPY;  Surgeon: Rogene Houston, MD;  Location: [facility];  Service: Endoscopy;  Laterality: N/A;  215  . TONSILLECTOMY    . YAG LASER APPLICATION Left 51/08/5850   Procedure: YAG LASER APPLICATION;  Surgeon: Elta Guadeloupe T. Gershon Crane, MD;  Location: [facility];  Service: Ophthalmology;  Laterality: Left;  left    Social History:   reports that she has never smoked. She has never used smokeless tobacco. She reports that she does not drink alcohol or use drugs.  Allergies  Allergen Reactions  . Penicillins Anaphylaxis  . Feldene [Piroxicam] Nausea And  Vomiting  . Sulfa Antibiotics     swelling    No family history on file.   Prior to Admission medications   Medication Sig Start Date End Date Taking? Authorizing Provider  alendronate (FOSAMAX) 70 MG tablet Take 70 mg by mouth once a week. Take with a full glass of water on an empty stomach.   Yes [provider]  ALPRAZolam Duanne Moron) 1 MG tablet Take 1 mg by mouth at bedtime.   Yes [provider]  Biotin w/ Vitamins C & E (HAIR/SKIN/NAILS PO) Take 1 tablet by mouth daily.   Yes [provider]  calcium-vitamin D (OS-CAL 500 + D) 500-200 MG-UNIT per tablet Take 1 tablet by mouth 2 (two) times daily.    Yes [provider]  HYDROcodone-acetaminophen (NORCO) 10-325 MG tablet 1 tablet 2 (two) times daily as needed. 10/25/15  Yes [provider]  loperamide (IMODIUM) 2 MG capsule Take by mouth as needed for diarrhea or loose stools.   Yes [provider]  Multiple Vitamin (MULTIVITAMIN WITH MINERALS) TABS Take 1 tablet by mouth daily.   Yes [provider]  omeprazole (PRILOSEC) 20 MG capsule Take 20 mg by mouth every morning.    Yes [provider]  Polyethyl Glycol-Propyl Glycol (SYSTANE OP) Apply 1 drop to eye daily as needed. Dry Eyes   Yes [provider]  sertraline (ZOLOFT) 50 MG tablet Take  50 mg by mouth every morning.    Yes [provider]  diphenoxylate-atropine (LOMOTIL) 2.5-0.025 MG tablet Take by mouth 4 (four) times daily as needed for diarrhea or loose stools.    [provider]    Physical Exam: Wt Readings from Last 3 Encounters:  11/24/18 64 kg  09/03/18 63.5 kg  05/03/16 67.1 kg   Vitals:   11/24/18 1345 11/24/18 1400 11/24/18 1415 11/24/18 1430  BP:  107/81    Pulse: (!) 57 (!) 105 (!) 112 (!) 110  Resp: (!) 24 (!) 23 (!) 30 (!) 25  Temp:      TempSrc:      SpO2: 95% 97% 95% 90%  Weight:      Height:          Constitutional:  Calm & comfortable Eyes: PERRLA,  lids and conjunctivae normal ENT:  Mucous membranes are moist.  Pharynx clear of exudate   Normal dentition.  Neck: Supple, no masses  Respiratory:  Clear to auscultation bilaterally  Normal respiratory effort.  Cardiovascular:  S1 & S2 heard, irregularly irregular rate and rhythm No Murmurs Abdomen:  Non distended No tenderness, No masses Bowel sounds normal Extremities:  No clubbing / cyanosis No pedal edema No joint deformity    Skin:  No rashes, lesions or ulcers Neurologic:  AAO x 3 CN 2-12 grossly intact Sensation intact Strength 5/5 in all 4 extremities Psychiatric:  Normal Mood and affect    Labs on Admission: I have personally reviewed following labs and imaging studies  CBC: Recent Labs  Lab 11/24/18 1135  WBC 6.9  NEUTROABS 5.2  HGB 12.5  HCT 40.0  MCV 85.3  PLT 161   Basic Metabolic Panel: Recent Labs  Lab 11/24/18 1135  NA 142  K 3.7  CL 103  CO2 25  GLUCOSE 102*  BUN 18  CREATININE 0.82  CALCIUM 9.5   GFR: Estimated Creatinine Clearance: 46.9 mL/min (by C-G formula based on SCr of 0.82 mg/dL). Liver Function Tests: Recent Labs  Lab 11/24/18 1135  AST 30  ALT 20  ALKPHOS 59  BILITOT 2.2*  PROT 7.1  ALBUMIN 4.1   No results for input(s): LIPASE, AMYLASE in the last 168 hours. No results for input(s): AMMONIA in the last 168 hours. Coagulation Profile: No results for input(s): INR, PROTIME in the last 168 hours. Cardiac Enzymes: Recent Labs  Lab 11/24/18 1135  TROPONINI <0.03   BNP (last 3 results) No results for input(s): PROBNP in the last 8760 hours. HbA1C: No results for input(s): HGBA1C in the last 72 hours. CBG: No results for input(s): GLUCAP in the last 168 hours. Lipid Profile: No results for input(s): CHOL, HDL, LDLCALC, TRIG, CHOLHDL, LDLDIRECT in the last 72 hours. Thyroid Function Tests: No results for input(s): TSH, T4TOTAL, FREET4, T3FREE, THYROIDAB in the last 72 hours. Anemia Panel: No results for  input(s): VITAMINB12, FOLATE, FERRITIN, TIBC, IRON, RETICCTPCT in the last 72 hours. Urine analysis: No results found for: COLORURINE, APPEARANCEUR, LABSPEC, PHURINE, GLUCOSEU, HGBUR, BILIRUBINUR, KETONESUR, PROTEINUR, UROBILINOGEN, NITRITE, LEUKOCYTESUR Sepsis Labs: @LABRCNTIP (procalcitonin:4,lacticidven:4) )No results found for this or any previous visit (from the past 240 hour(s)).   Radiological Exams on Admission: Dg Chest Port 1 View  Result Date: 11/24/2018 CLINICAL DATA:  Weakness.  Atrial fibrillation. EXAM: PORTABLE CHEST 1 VIEW COMPARISON:  01/29/2014 FINDINGS: The heart size is enlarged. There is aortic atherosclerosis. Bilateral pleural effusions and mild pulmonary edema identified. The atelectasis identified within the left midlung. IMPRESSION: 1. Cardiac enlargement,  aortic atherosclerosis and congestive heart failure. 2.  Aortic Atherosclerosis (ICD10-I70.0). Electronically Signed   By: Kerby Moors M.D.   On: 11/24/2018 12:19    EKG: Independently reviewed. A-fib with rapid heart rate  Assessment/Plan Active Problems:   Atrial fibrillation with RVR (HCC)  - she is currently on a Cardizem infusion- I have asked the RN to increase the infusion to 10 mg /hr as her HR is still in 120-130s at rest- she does not feel dizzy at rest - I have discussed the need for anticoagulation for secondary stroke prevention - I will start Lovenox for now until it is determined whether this is valvular or non-valvular A-fib - she will need a PT eval due to her falls but I suspect she may be falling due to uncontrolled HR and poor cardiac output - will order an ECHO and TSH  Chronic back pain- - she is prescribed Hydrocodone 10 mg BID but states that she has been taking it TID lately- I have asked her to cut back to BID, as prescribed   Anxiety / depression - cont Zoloft and Xanax at bedtime  DVT prophylaxis: Lovenox Code Status: Full code but she does not want to be kept on life support   Consults called: none  Admission status: inpatient    Debbe Odea MD [facility] Pager: [email or web address] Password [email or web address] 7PM-7AM, please contact night-coverage   11/24/2018, 2:47 PM

## 2018-11-24 NOTE — ED Provider Notes (Signed)
[facility] EMERGENCY DEPARTMENT Provider Note   CSN: [id number] Arrival date & time: 11/24/18  1108    History   Chief Complaint Chief Complaint  Patient presents with   Dizziness    HPI Kristy Mckinney is a 85 y.o. female.     Level 5 caveat for urgency of condition.  Dizziness for the past month with associated occasional fall.  No gross neurological deficits.  Questionable history of atrial fibrillation.  No substernal chest pain, dyspnea, diaphoresis.  She has taken nothing for this.  Severity is moderate.     Past Medical History:  Diagnosis Date   Chronic back pain    Diarrhea    Dysphagia 05/03/2016   GERD (gastroesophageal reflux disease)     Patient Active Problem List   Diagnosis Date Noted   Constipation 05/03/2016   Dysphagia 05/03/2016   Cholecystitis with cholelithiasis 01/29/2014    Past Surgical History:  Procedure Laterality Date   ABDOMINAL HYSTERECTOMY     BACK SURGERY     CHOLECYSTECTOMY N/A 01/29/2014   Procedure: LAPAROSCOPIC CHOLECYSTECTOMY;  Surgeon: Scherry Ran, MD;  Location: [facility];  Service: General;  Laterality: N/A;   COLONOSCOPY N/A 10/27/2015   Procedure: COLONOSCOPY;  Surgeon: Rogene Houston, MD;  Location: [facility];  Service: Endoscopy;  Laterality: N/A;  215   TONSILLECTOMY     YAG LASER APPLICATION Left 07/18/3233   Procedure: YAG LASER APPLICATION;  Surgeon: Elta Guadeloupe T. Gershon Crane, MD;  Location: [facility];  Service: Ophthalmology;  Laterality: Left;  left     OB History   No obstetric history on file.      Home Medications    Prior to Admission medications   Medication Sig Start Date End Date Taking? Authorizing Provider  alendronate (FOSAMAX) 70 MG tablet Take 70 mg by mouth once a week. Take with a full glass of water on an empty stomach.   Yes [provider]  ALPRAZolam Duanne Moron) 1 MG tablet Take 1 mg by mouth at bedtime.   Yes [provider]  Biotin w/ Vitamins C & E  (HAIR/SKIN/NAILS PO) Take 1 tablet by mouth daily.   Yes [provider]  calcium-vitamin D (OS-CAL 500 + D) 500-200 MG-UNIT per tablet Take 1 tablet by mouth 2 (two) times daily.    Yes [provider]  HYDROcodone-acetaminophen (NORCO) 10-325 MG tablet 1 tablet 2 (two) times daily as needed. 10/25/15  Yes [provider]  loperamide (IMODIUM) 2 MG capsule Take by mouth as needed for diarrhea or loose stools.   Yes [provider]  Multiple Vitamin (MULTIVITAMIN WITH MINERALS) TABS Take 1 tablet by mouth daily.   Yes [provider]  omeprazole (PRILOSEC) 20 MG capsule Take 20 mg by mouth every morning.    Yes [provider]  Polyethyl Glycol-Propyl Glycol (SYSTANE OP) Apply 1 drop to eye daily as needed. Dry Eyes   Yes [provider]  sertraline (ZOLOFT) 50 MG tablet Take 50 mg by mouth every morning.    Yes [provider]  diphenoxylate-atropine (LOMOTIL) 2.5-0.025 MG tablet Take by mouth 4 (four) times daily as needed for diarrhea or loose stools.    [provider]    Family History No family history on file.  Social History Social History   Tobacco Use   Smoking status: Never Smoker   Smokeless tobacco: Never Used  Substance Use Topics   Alcohol use: No   Drug use: No  Allergies   Penicillins; Feldene [piroxicam]; and Sulfa antibiotics   Review of Systems Review of Systems  Unable to perform ROS: Acuity of condition     Physical Exam Updated Vital Signs BP 118/87    Pulse (!) 134    Temp 98.1 F (36.7 C) (Oral)    Resp (!) 28    Ht 5' 5.5" (1.664 m)    Wt 64 kg    SpO2 96%    BMI 23.11 kg/m   Physical Exam Vitals signs and nursing note reviewed.  Constitutional:      Appearance: She is well-developed.  HENT:     Head: Normocephalic and atraumatic.  Eyes:     Conjunctiva/sclera: Conjunctivae normal.  Neck:     Musculoskeletal: Neck supple.  Cardiovascular:     Rate and  Rhythm: Tachycardia present. Rhythm irregular.     Comments: Irregularly irregular rhythm Pulmonary:     Effort: Pulmonary effort is normal.     Breath sounds: Normal breath sounds.  Abdominal:     General: Bowel sounds are normal.     Palpations: Abdomen is soft.  Musculoskeletal: Normal range of motion.  Skin:    General: Skin is warm and dry.  Neurological:     Mental Status: She is alert and oriented to person, place, and time.  Psychiatric:        Behavior: Behavior normal.      ED Treatments / Results  Labs (all labs ordered are listed, but only abnormal results are displayed) Labs Reviewed  CBC WITH DIFFERENTIAL/PLATELET - Abnormal; Notable for the following components:      Result Value   RDW 16.6 (*)    All other components within normal limits  COMPREHENSIVE METABOLIC PANEL - Abnormal; Notable for the following components:   Glucose, Bld 102 (*)    Total Bilirubin 2.2 (*)    All other components within normal limits  TROPONIN I    EKG EKG Interpretation  Date/Time:  Sunday Nov 24 2018 11:24:04 EDT Ventricular Rate:  143 PR Interval:    QRS Duration: 83 QT Interval:  309 QTC Calculation: 477 R Axis:   -52 Text Interpretation:  Atrial fibrillation with rapid V-rate Left anterior fascicular block Low voltage, precordial leads Anteroseptal infarct, old Confirmed by Nat Christen [id number]) on 11/24/2018 11:29:13 AM   Radiology Dg Chest Port 1 View  Result Date: 11/24/2018 CLINICAL DATA:  Weakness.  Atrial fibrillation. EXAM: PORTABLE CHEST 1 VIEW COMPARISON:  01/29/2014 FINDINGS: The heart size is enlarged. There is aortic atherosclerosis. Bilateral pleural effusions and mild pulmonary edema identified. The atelectasis identified within the left midlung. IMPRESSION: 1. Cardiac enlargement, aortic atherosclerosis and congestive heart failure. 2.  Aortic Atherosclerosis (ICD10-I70.0). Electronically Signed   By: Kerby Moors M.D.   On: 11/24/2018 12:19     Procedures Procedures (including critical care time)  Medications Ordered in ED Medications  diltiazem (CARDIZEM) 1 mg/mL load via infusion 5 mg (5 mg Intravenous Bolus from Bag 11/24/18 1201)    And  diltiazem (CARDIZEM) 100 mg in dextrose 5 % 100 mL (1 mg/mL) infusion (5 mg/hr Intravenous New Bag/Given 11/24/18 1157)     Initial Impression / Assessment and Plan / ED Course  I have reviewed the triage vital signs and the nursing notes.  Pertinent labs & imaging results that were available during my care of the patient were reviewed by me and considered in my medical decision making (see chart for details).       Patient presents  with dizziness and questionable new onset atrial fibrillation.  Initial heart rate 143.  Cardizem bolus and drip initiated.  Will discuss with cardiology.  CRITICAL CARE Performed by: Nat Christen Total critical care time: 30 minutes Critical care time was exclusive of separately billable procedures and treating other patients. Critical care was necessary to treat or prevent imminent or life-threatening deterioration. Critical care was time spent personally by me on the following activities: development of treatment plan with patient and/or surrogate as well as nursing, discussions with consultants, evaluation of patient's response to treatment, examination of patient, obtaining history from patient or surrogate, ordering and performing treatments and interventions, ordering and review of laboratory studies, ordering and review of radiographic studies, pulse oximetry and re-evaluation of patient's condition.   Final Clinical Impressions(s) / ED Diagnoses   Final diagnoses:  Atrial fibrillation with RVR [facility])    ED Discharge Orders    None       Nat Christen, MD 11/24/18 1338

## 2018-11-24 NOTE — ED Triage Notes (Signed)
Patient presents today for dizziness for at least a month, with a fall.  Patient states she had tripped over a curb hitting right side of face.  Patient states she had an online MD appointment with this fall.  Patient states she has been dizzy and weak with chronic back pain since then with no recent falls.

## 2018-11-25 ENCOUNTER — Inpatient Hospital Stay (HOSPITAL_COMMUNITY): Payer: PPO

## 2018-11-25 DIAGNOSIS — R42 Dizziness and giddiness: Secondary | ICD-10-CM | POA: Diagnosis not present

## 2018-11-25 DIAGNOSIS — M810 Age-related osteoporosis without current pathological fracture: Secondary | ICD-10-CM | POA: Diagnosis present

## 2018-11-25 DIAGNOSIS — Z20828 Contact with and (suspected) exposure to other viral communicable diseases: Secondary | ICD-10-CM | POA: Diagnosis present

## 2018-11-25 DIAGNOSIS — J811 Chronic pulmonary edema: Secondary | ICD-10-CM | POA: Diagnosis present

## 2018-11-25 DIAGNOSIS — K219 Gastro-esophageal reflux disease without esophagitis: Secondary | ICD-10-CM | POA: Diagnosis present

## 2018-11-25 DIAGNOSIS — I059 Rheumatic mitral valve disease, unspecified: Secondary | ICD-10-CM | POA: Diagnosis not present

## 2018-11-25 DIAGNOSIS — G8929 Other chronic pain: Secondary | ICD-10-CM | POA: Diagnosis present

## 2018-11-25 DIAGNOSIS — Z880 Allergy status to penicillin: Secondary | ICD-10-CM | POA: Diagnosis not present

## 2018-11-25 DIAGNOSIS — I4891 Unspecified atrial fibrillation: Secondary | ICD-10-CM | POA: Diagnosis present

## 2018-11-25 DIAGNOSIS — F419 Anxiety disorder, unspecified: Secondary | ICD-10-CM | POA: Diagnosis present

## 2018-11-25 DIAGNOSIS — I509 Heart failure, unspecified: Secondary | ICD-10-CM | POA: Diagnosis not present

## 2018-11-25 DIAGNOSIS — R296 Repeated falls: Secondary | ICD-10-CM | POA: Diagnosis not present

## 2018-11-25 DIAGNOSIS — I340 Nonrheumatic mitral (valve) insufficiency: Secondary | ICD-10-CM

## 2018-11-25 DIAGNOSIS — I361 Nonrheumatic tricuspid (valve) insufficiency: Secondary | ICD-10-CM | POA: Diagnosis not present

## 2018-11-25 DIAGNOSIS — F329 Major depressive disorder, single episode, unspecified: Secondary | ICD-10-CM | POA: Diagnosis present

## 2018-11-25 DIAGNOSIS — J90 Pleural effusion, not elsewhere classified: Secondary | ICD-10-CM | POA: Diagnosis present

## 2018-11-25 DIAGNOSIS — Z7983 Long term (current) use of bisphosphonates: Secondary | ICD-10-CM | POA: Diagnosis not present

## 2018-11-25 DIAGNOSIS — M549 Dorsalgia, unspecified: Secondary | ICD-10-CM | POA: Diagnosis present

## 2018-11-25 DIAGNOSIS — Z882 Allergy status to sulfonamides status: Secondary | ICD-10-CM | POA: Diagnosis not present

## 2018-11-25 DIAGNOSIS — Z888 Allergy status to other drugs, medicaments and biological substances status: Secondary | ICD-10-CM | POA: Diagnosis not present

## 2018-11-25 DIAGNOSIS — E876 Hypokalemia: Secondary | ICD-10-CM | POA: Diagnosis present

## 2018-11-25 LAB — ECHOCARDIOGRAM COMPLETE
Height: 65 in
Weight: 2317.48 oz

## 2018-11-25 LAB — BASIC METABOLIC PANEL
Anion gap: 11 (ref 5–15)
BUN: 14 mg/dL (ref 8–23)
CO2: 26 mmol/L (ref 22–32)
Calcium: 8.9 mg/dL (ref 8.9–10.3)
Chloride: 104 mmol/L (ref 98–111)
Creatinine, Ser: 0.71 mg/dL (ref 0.44–1.00)
GFR calc Af Amer: >60 mL/min (ref >60)
GFR calc non Af Amer: >60 mL/min (ref >60)
Glucose, Bld: 95 mg/dL (ref 70–99)
Potassium: 3.4 mmol/L — ABNORMAL LOW (ref 3.5–5.1)
Sodium: 141 mmol/L (ref 135–145)

## 2018-11-25 LAB — TSH: TSH: 1.615 u[IU]/mL (ref 0.350–4.500)

## 2018-11-25 LAB — MRSA PCR SCREENING: MRSA by PCR: NEGATIVE

## 2018-11-25 LAB — MAGNESIUM: Magnesium: 1.5 mg/dL — ABNORMAL LOW (ref 1.7–2.4)

## 2018-11-25 MED ORDER — DILTIAZEM HCL 60 MG PO TABS
60.0000 mg | ORAL_TABLET | Freq: Four times a day (QID) | ORAL | Status: DC
  Administered 2018-11-25 – 2018-11-26 (×5): 60 mg via ORAL
  Filled 2018-11-25: qty 1
  Filled 2018-11-25 (×3): qty 2
  Filled 2018-11-25: qty 1

## 2018-11-25 MED ORDER — DILTIAZEM HCL 100 MG IV SOLR: 5.0000 mg/h | INTRAVENOUS | Status: DC

## 2018-11-25 NOTE — Plan of Care (Signed)
  Problem: Acute Rehab PT Goals(only PT should resolve) Goal: Pt Will Go Supine/Side To Sit Outcome: Progressing Flowsheets (Taken 11/25/2018 1312) Pt will go Supine/Side to Sit: Independently Goal: Patient Will Transfer Sit To/From Stand Outcome: Progressing Flowsheets (Taken 11/25/2018 1312) Patient will transfer sit to/from stand: with supervision Goal: Pt Will Transfer Bed To Chair/Chair To Bed Outcome: Progressing Flowsheets (Taken 11/25/2018 1312) Pt will Transfer Bed to Chair/Chair to Bed: with supervision Goal: Pt Will Ambulate Outcome: Progressing Flowsheets (Taken 11/25/2018 1312) Pt will Ambulate: > 125 feet; with supervision; with rolling walker; with cane   1:12 PM, 11/25/18 Lonell Grandchild, MPT Physical Therapist with [facility] [phone] office [phone] mobile phone

## 2018-11-25 NOTE — Clinical Social Work Note (Signed)
CSW acknowledgement recommendation of outpt PT and need for RW.  Ordered. Pt will go to [facility] clinic. Contacted Kathy with [facility] at 1:45PM.

## 2018-11-25 NOTE — Consult Note (Signed)
Cardiology Consultation:   Patient ID: Kristy Mckinney; [id number]; 15-Nov-1931   Admit date: 11/24/2018 Date of Consult: 11/25/2018  Primary Care Provider: [name], MD Primary Cardiologist: New   Patient Profile:   Kristy Mckinney is an 85 y.o. female with a history of GERD, chronic back pain, and osteoporosis who is being seen today for the evaluation of newly documented atrial fibrillation at the request of Dr. Luan Pulling.  History of Present Illness:   Kristy Mckinney to the hospital reporting recurring dizziness with intermittent falls, more recently a feeling of shortness of breath and palpitations particularly at nighttime.  She was noted to be in rapid atrial fibrillation on assessment in the ER, admitted on intravenous diltiazem for heart rate control and started on Lovenox.  She has no prior history of documented cardiac arrhythmia.  CHADSVASC score is 77 (age, gender, aortic atherosclerosis).  Chest x-ray from 11/24/2018 reported cardiac enlargement with mild pulmonary edema, also incidentally noted aortic atherosclerosis.  TSH is normal.  Past Medical History:  Diagnosis Date  . Chronic back pain   . Diarrhea   . Dysphagia 05/03/2016  . GERD (gastroesophageal reflux disease)     Past Surgical History:  Procedure Laterality Date  . ABDOMINAL HYSTERECTOMY    . BACK SURGERY    . CHOLECYSTECTOMY N/A 01/29/2014   Procedure: LAPAROSCOPIC CHOLECYSTECTOMY;  Surgeon: Scherry Ran, MD;  Location: [facility];  Service: General;  Laterality: N/A;  . COLONOSCOPY N/A 10/27/2015   Procedure: COLONOSCOPY;  Surgeon: Rogene Houston, MD;  Location: [facility];  Service: Endoscopy;  Laterality: N/A;  215  . TONSILLECTOMY    . YAG LASER APPLICATION Left 40/02/6760   Procedure: YAG LASER APPLICATION;  Surgeon: Elta Guadeloupe T. Gershon Crane, MD;  Location: [facility];  Service: Ophthalmology;  Laterality: Left;  left     Inpatient Medications: Scheduled Meds: . ALPRAZolam  1 mg Oral QHS  .  Chlorhexidine Gluconate Cloth  6 each Topical Daily  . diltiazem  60 mg Oral Q6H  . enoxaparin (LOVENOX) injection  1 mg/kg Subcutaneous Q12H  . pantoprazole  40 mg Oral Daily  . sertraline  50 mg Oral q morning - 10a  . sodium chloride flush  3 mL Intravenous Q12H   Continuous Infusions: . sodium chloride    . diltiazem (CARDIZEM) infusion     PRN Meds: sodium chloride, acetaminophen **OR** acetaminophen, HYDROcodone-acetaminophen, sodium chloride flush  Allergies:    Allergies  Allergen Reactions  . Penicillins Anaphylaxis  . Feldene [Piroxicam] Nausea And Vomiting  . Sulfa Antibiotics     swelling    Social History:   Social History   Socioeconomic History  . Marital status: Married    Spouse name: Not on file  . Number of children: Not on file  . Years of education: Not on file  . Highest education level: Not on file  Occupational History  . Not on file  Social Needs  . Financial resource strain: Not on file  . Food insecurity:    Worry: Not on file    Inability: Not on file  . Transportation needs:    Medical: Not on file    Non-medical: Not on file  Tobacco Use  . Smoking status: Never Smoker  . Smokeless tobacco: Never Used  Substance and Sexual Activity  . Alcohol use: No  . Drug use: No  . Sexual activity: Yes    Birth control/protection: Surgical  Lifestyle  . Physical activity:    Days per  week: Not on file    Minutes per session: Not on file  . Stress: Not on file  Relationships  . Social connections:    Talks on phone: Not on file    Gets together: Not on file    Attends religious service: Not on file    Active member of club or organization: Not on file    Attends meetings of clubs or organizations: Not on file    Relationship status: Not on file  . Intimate partner violence:    Fear of current or ex partner: Not on file    Emotionally abused: Not on file    Physically abused: Not on file    Forced sexual activity: Not on file  Other  Topics Concern  . Not on file  Social History Narrative  . Not on file    Family History:   The patient's family history is not on file.  ROS:  Please see the history of present illness.  All other ROS reviewed and negative.     Physical Exam/Data:   Vitals:   11/25/18 0400 11/25/18 0500 11/25/18 0600 11/25/18 0700  BP: (!) 115/96 98/75 (!) 141/75 115/78  Pulse: 70 95 100 93  Resp: (!) 35 (!) 29 15 (!) 26  Temp: 98.4 F (36.9 C)     TempSrc: Oral     SpO2: 94% (!) 89% 96% 97%  Weight:  65.7 kg    Height:        Intake/Output Summary (Last 24 hours) at 11/25/2018 0827 Last data filed at 11/25/2018 0733 Gross per 24 hour  Intake 173.67 ml  Output -  Net 173.67 ml   Filed Weights   11/24/18 1121 11/24/18 1800 11/25/18 0500  Weight: 64 kg 65.9 kg 65.7 kg   Body mass index is 24.1 kg/m.   Gen: Elderly woman, no acute distress. HEENT: Conjunctiva and lids normal, oropharynx clear with moist mucosa. Neck: Supple, no elevated JVP or carotid bruits, no thyromegaly. Lungs: Crackles at the bases without wheezing, nonlabored breathing at rest. Cardiac: Irregularly irregular, no S3, soft systolic murmur, no pericardial rub. Abdomen: Soft, nontender, bowel sounds present. Extremities: No pitting edema, distal pulses 2+. Skin: Warm and dry. Musculoskeletal: Mild kyphosis. Neuropsychiatric: Alert and oriented x3, affect grossly appropriate.  EKG:  I personally reviewed the tracing from 11/25/2018 shows rate controlled atrial fibrillation with left anterior fascicular block and poor anterior R wave progression, rule out old anterior infarct pattern.  Telemetry:  I personally reviewed telemetry which shows atrial fibrillation.  Relevant CV Studies:  None prior.  Laboratory Data:  Chemistry Recent Labs  Lab 11/24/18 1135 11/25/18 0735  NA 142 141  K 3.7 3.4*  CL 103 104  CO2 25 26  GLUCOSE 102* 95  BUN 18 14  CREATININE 0.82 0.71  CALCIUM 9.5 8.9  GFRNONAA >60 >60   GFRAA >60 >60  ANIONGAP 14 11    Recent Labs  Lab 11/24/18 1135  PROT 7.1  ALBUMIN 4.1  AST 30  ALT 20  ALKPHOS 59  BILITOT 2.2*   Hematology Recent Labs  Lab 11/24/18 1135  WBC 6.9  RBC 4.69  HGB 12.5  HCT 40.0  MCV 85.3  MCH 26.7  MCHC 31.3  RDW 16.6*  PLT 256   Cardiac Enzymes Recent Labs  Lab 11/24/18 1135  TROPONINI <0.03   No results for input(s): TROPIPOC in the last 168 hours.  BNPNo results for input(s): BNP, PROBNP in the last 168 hours.  DDimer No results for input(s): DDIMER in the last 168 hours.  Radiology/Studies:  Dg Chest Port 1 View  Result Date: 11/24/2018 CLINICAL DATA:  Weakness.  Atrial fibrillation. EXAM: PORTABLE CHEST 1 VIEW COMPARISON:  01/29/2014 FINDINGS: The heart size is enlarged. There is aortic atherosclerosis. Bilateral pleural effusions and mild pulmonary edema identified. The atelectasis identified within the left midlung. IMPRESSION: 1. Cardiac enlargement, aortic atherosclerosis and congestive heart failure. 2.  Aortic Atherosclerosis (ICD10-I70.0). Electronically Signed   By: Kerby Moors M.D.   On: 11/24/2018 12:19    Assessment and Plan:   1.  Newly documented atrial fibrillation presenting with RVR and symptoms of congestive heart failure.  Duration is uncertain but potentially over the last several weeks based on discussion with the patient today.  CHADSVASC score is 4.  She is on intravenous diltiazem at this time for heart rate control and Lovenox for stroke prophylaxis.  TSH is normal.  Troponin I argues against ACS.  Echocardiogram is pending.  2.  Recent dizziness with intermittent falls, possibly related to problem #1.  May be other issues to consider as well.  Would consider imaging of the brain to make sure that she has not had intermittent strokes.  Also consider PT evaluation.  Plan at this time is to convert from intravenous diltiazem to oral diltiazem 60 mg p.o. every 6 hours.  Echocardiogram is pending for  cardiac structural assessment, may need to adjust heart rate control regimen depending on LVEF.  Anticipate switch to Eliquis for stroke prophylaxis eventually.  Consider further neuroimaging and PT assessment per primary service.   Signed, Rozann Lesches, MD  11/25/2018 8:27 AM

## 2018-11-25 NOTE — Progress Notes (Signed)
Subjective: She was admitted with atrial fib with rapid ventricular response.  She says she has been dizzy.  She has had several falls.  She has been evaluated in the office and then evaluated by telephone because of the coronavirus pandemic and was not noted to be tachycardic at those times.  The telephone evaluation of course we could not measure her pulse.  She was found to have atrial fib with a heart rate in the 140s and was started on Cardizem.  Her heart rate is now about 80 and she looks like she is still in atrial fib.  No chest pain.  She simply had dizziness.  No previous history of cardiac arrhythmias.  She is mildly confused.  Objective: Vital signs in last 24 hours: Temp:  [97.7 F (36.5 C)-98.4 F (36.9 C)] 98.4 F (36.9 C) (05/18 0400) Pulse Rate:  [31-142] 93 (05/18 0700) Resp:  [15-35] 26 (05/18 0700) BP: (98-159)/(57-128) 115/78 (05/18 0700) SpO2:  [71 %-98 %] 97 % (05/18 0700) Weight:  [64 kg-65.9 kg] 65.7 kg (05/18 0500) Weight change:  Last BM Date: 11/23/18  Intake/Output from previous day: 05/17 0701 - 05/18 0700 In: 158.2 [I.V.:158.2] Out: -   PHYSICAL EXAM General appearance: alert, cooperative and Mildly confused Resp: clear to auscultation bilaterally Cardio: Her heart is irregularly irregular with a rate of about 80 now GI: soft, non-tender; bowel sounds normal; no masses,  no organomegaly Extremities: extremities normal, atraumatic, no cyanosis or edema  Lab Results:  Results for orders placed or performed during the hospital encounter of 11/24/18 (from the past 48 hour(s))  CBC with Differential     Status: Abnormal   Collection Time: 11/24/18 11:35 AM  Result Value Ref Range   WBC 6.9 4.0 - 10.5 K/uL   RBC 4.69 3.87 - 5.11 MIL/uL   Hemoglobin 12.5 12.0 - 15.0 g/dL   HCT 40.0 36.0 - 46.0 %   MCV 85.3 80.0 - 100.0 fL   MCH 26.7 26.0 - 34.0 pg   MCHC 31.3 30.0 - 36.0 g/dL   RDW 16.6 (H) 11.5 - 15.5 %   Platelets 256 150 - 400 K/uL   nRBC 0.0  0.0 - 0.2 %   Neutrophils Relative % 76 %   Neutro Abs 5.2 1.7 - 7.7 K/uL   Lymphocytes Relative 16 %   Lymphs Abs 1.1 0.7 - 4.0 K/uL   Monocytes Relative 6 %   Monocytes Absolute 0.4 0.1 - 1.0 K/uL   Eosinophils Relative 1 %   Eosinophils Absolute 0.1 0.0 - 0.5 K/uL   Basophils Relative 1 %   Basophils Absolute 0.1 0.0 - 0.1 K/uL   Immature Granulocytes 0 %   Abs Immature Granulocytes 0.02 0.00 - 0.07 K/uL    Comment: Performed at [facility], [address] [place], [place] [address]  Comprehensive metabolic panel     Status: Abnormal   Collection Time: 11/24/18 11:35 AM  Result Value Ref Range   Sodium 142 135 - 145 mmol/L   Potassium 3.7 3.5 - 5.1 mmol/L   Chloride 103 98 - 111 mmol/L   CO2 25 22 - 32 mmol/L   Glucose, Bld 102 (H) 70 - 99 mg/dL   BUN 18 8 - 23 mg/dL   Creatinine, Ser 0.82 0.44 - 1.00 mg/dL   Calcium 9.5 8.9 - 10.3 mg/dL   Total Protein 7.1 6.5 - 8.1 g/dL   Albumin 4.1 3.5 - 5.0 g/dL   AST 30 15 - 41 U/L  ALT 20 0 - 44 U/L   Alkaline Phosphatase 59 38 - 126 U/L   Total Bilirubin 2.2 (H) 0.3 - 1.2 mg/dL   GFR calc non Af Amer >60 >60 mL/min   GFR calc Af Amer >60 >60 mL/min   Anion gap 14 5 - 15    Comment: Performed at [facility], [address], [place], [place] [address]  Troponin I - Once     Status: None   Collection Time: 11/24/18 11:35 AM  Result Value Ref Range   Troponin I <0.03 <0.03 ng/mL    Comment: Performed at [facility], [address], [place], [place] [address]  SARS Coronavirus 2 (CEPHEID - Performed in [facility] hospital lab), Hosp Order     Status: None   Collection Time: 11/24/18  4:13 PM  Result Value Ref Range   SARS Coronavirus 2 NEGATIVE NEGATIVE    Comment: (NOTE) If result is NEGATIVE SARS-CoV-2 target nucleic acids are NOT DETECTED. The SARS-CoV-2 RNA is generally detectable in upper and lower  respiratory specimens during the acute phase of infection. The lowest  concentration of SARS-CoV-2 viral copies this assay  can detect is 250  copies / mL. A negative result does not preclude SARS-CoV-2 infection  and should not be used as the sole basis for treatment or other  patient management decisions.  A negative result may occur with  improper specimen collection / handling, submission of specimen other  than nasopharyngeal swab, presence of viral mutation(s) within the  areas targeted by this assay, and inadequate number of viral copies  (<250 copies / mL). A negative result must be combined with clinical  observations, patient history, and epidemiological information. If result is POSITIVE SARS-CoV-2 target nucleic acids are DETECTED. The SARS-CoV-2 RNA is generally detectable in upper and lower  respiratory specimens dur ing the acute phase of infection.  Positive  results are indicative of active infection with SARS-CoV-2.  Clinical  correlation with patient history and other diagnostic information is  necessary to determine patient infection status.  Positive results do  not rule out bacterial infection or co-infection with other viruses. If result is PRESUMPTIVE POSTIVE SARS-CoV-2 nucleic acids MAY BE PRESENT.   A presumptive positive result was obtained on the submitted specimen  and confirmed on repeat testing.  While 2019 novel coronavirus  (SARS-CoV-2) nucleic acids may be present in the submitted sample  additional confirmatory testing may be necessary for epidemiological  and / or clinical management purposes  to differentiate between  SARS-CoV-2 and other Sarbecovirus currently known to infect humans.  If clinically indicated additional testing with an alternate test  methodology [phone]) is advised. The SARS-CoV-2 RNA is generally  detectable in upper and lower respiratory sp ecimens during the acute  phase of infection. The expected result is Negative. Fact Sheet for Patients:  StrictlyIdeas.no Fact Sheet for Healthcare  Providers: BankingDealers.co.za This test is not yet approved or cleared by the [place] FDA and has been authorized for detection and/or diagnosis of SARS-CoV-2 by FDA under an Emergency Use Authorization (EUA).  This EUA will remain in effect (meaning this test can be used) for the duration of the COVID-19 declaration under Section 564(b)(1) of the Act, 21 U.S.C. section 360bbb-3(b)(1), unless the authorization is terminated or revoked sooner. Performed at [facility], [address], [place], [place] [address]   MRSA PCR Screening     Status: None   Collection Time: 11/24/18  6:21 PM  Result Value Ref Range  MRSA by PCR NEGATIVE NEGATIVE    Comment:        The GeneXpert MRSA Assay (FDA approved for NASAL specimens only), is one component of a comprehensive MRSA colonization surveillance program. It is not intended to diagnose MRSA infection nor to guide or monitor treatment for MRSA infections. Performed at [facility], [address], [place], [place] [address]   TSH     Status: None   Collection Time: 11/25/18  4:01 AM  Result Value Ref Range   TSH 1.615 0.350 - 4.500 uIU/mL    Comment: Performed by a 3rd Generation assay with a functional sensitivity of <=0.01 uIU/mL. Performed at [facility], [address], [place], [place] [address]     ABGS No results for input(s): PHART, PO2ART, TCO2, HCO3 in the last 72 hours.  Invalid input(s): PCO2 CULTURES Recent Results (from the past 240 hour(s))  SARS Coronavirus 2 (CEPHEID - Performed in [place] hospital lab), Hosp Order     Status: None   Collection Time: 11/24/18  4:13 PM  Result Value Ref Range Status   SARS Coronavirus 2 NEGATIVE NEGATIVE Final    Comment: (NOTE) If result is NEGATIVE SARS-CoV-2 target nucleic acids are NOT DETECTED. The SARS-CoV-2 RNA is generally detectable in upper and lower  respiratory specimens during the acute phase of infection. The lowest  concentration  of SARS-CoV-2 viral copies this assay can detect is 250  copies / mL. A negative result does not preclude SARS-CoV-2 infection  and should not be used as the sole basis for treatment or other  patient management decisions.  A negative result may occur with  improper specimen collection / handling, submission of specimen other  than nasopharyngeal swab, presence of viral mutation(s) within the  areas targeted by this assay, and inadequate number of viral copies  (<250 copies / mL). A negative result must be combined with clinical  observations, patient history, and epidemiological information. If result is POSITIVE SARS-CoV-2 target nucleic acids are DETECTED. The SARS-CoV-2 RNA is generally detectable in upper and lower  respiratory specimens dur ing the acute phase of infection.  Positive  results are indicative of active infection with SARS-CoV-2.  Clinical  correlation with patient history and other diagnostic information is  necessary to determine patient infection status.  Positive results do  not rule out bacterial infection or co-infection with other viruses. If result is PRESUMPTIVE POSTIVE SARS-CoV-2 nucleic acids MAY BE PRESENT.   A presumptive positive result was obtained on the submitted specimen  and confirmed on repeat testing.  While 2019 novel coronavirus  (SARS-CoV-2) nucleic acids may be present in the submitted sample  additional confirmatory testing may be necessary for epidemiological  and / or clinical management purposes  to differentiate between  SARS-CoV-2 and other Sarbecovirus currently known to infect humans.  If clinically indicated additional testing with an alternate test  methodology [phone]) is advised. The SARS-CoV-2 RNA is generally  detectable in upper and lower respiratory sp ecimens during the acute  phase of infection. The expected result is Negative. Fact Sheet for Patients:  StrictlyIdeas.no Fact Sheet for Healthcare  Providers: BankingDealers.co.za This test is not yet approved or cleared by the [place] FDA and has been authorized for detection and/or diagnosis of SARS-CoV-2 by FDA under an Emergency Use Authorization (EUA).  This EUA will remain in effect (meaning this test can be used) for the duration of the COVID-19 declaration under Section 564(b)(1) of the Act, 21 U.S.C. section 360bbb-3(b)(1), unless the  authorization is terminated or revoked sooner. Performed at [facility], [address], [place], [place] [address]   MRSA PCR Screening     Status: None   Collection Time: 11/24/18  6:21 PM  Result Value Ref Range Status   MRSA by PCR NEGATIVE NEGATIVE Final    Comment:        The GeneXpert MRSA Assay (FDA approved for NASAL specimens only), is one component of a comprehensive MRSA colonization surveillance program. It is not intended to diagnose MRSA infection nor to guide or monitor treatment for MRSA infections. Performed at [facility], [address], [place], [place] [address]    Studies/Results: Dg Chest Port 1 View  Result Date: 11/24/2018 CLINICAL DATA:  Weakness.  Atrial fibrillation. EXAM: PORTABLE CHEST 1 VIEW COMPARISON:  01/29/2014 FINDINGS: The heart size is enlarged. There is aortic atherosclerosis. Bilateral pleural effusions and mild pulmonary edema identified. The atelectasis identified within the left midlung. IMPRESSION: 1. Cardiac enlargement, aortic atherosclerosis and congestive heart failure. 2.  Aortic Atherosclerosis (ICD10-I70.0). Electronically Signed   By: Kerby Moors M.D.   On: 11/24/2018 12:19    Medications:  Prior to Admission:  Medications Prior to Admission  Medication Sig Dispense Refill Last Dose  . alendronate (FOSAMAX) 70 MG tablet Take 70 mg by mouth once a week. Take with a full glass of water on an empty stomach.   Past Week at Unknown time  . ALPRAZolam (XANAX) 1 MG tablet Take 1 mg by mouth at bedtime.    11/23/2018 at Unknown time  . Biotin w/ Vitamins C & E (HAIR/SKIN/NAILS PO) Take 1 tablet by mouth daily.   11/23/2018 at Unknown time  . calcium-vitamin D (OS-CAL 500 + D) 500-200 MG-UNIT per tablet Take 1 tablet by mouth 2 (two) times daily.    11/23/2018 at Unknown time  . HYDROcodone-acetaminophen (NORCO) 10-325 MG tablet 1 tablet 2 (two) times daily as needed.   Past Week at Unknown time  . loperamide (IMODIUM) 2 MG capsule Take by mouth as needed for diarrhea or loose stools.   Not Taking  . Multiple Vitamin (MULTIVITAMIN WITH MINERALS) TABS Take 1 tablet by mouth daily.   11/23/2018 at Unknown time  . omeprazole (PRILOSEC) 20 MG capsule Take 20 mg by mouth every morning.    11/23/2018 at Unknown time  . Polyethyl Glycol-Propyl Glycol (SYSTANE OP) Apply 1 drop to eye daily as needed. Dry Eyes   11/23/2018 at Unknown time  . sertraline (ZOLOFT) 50 MG tablet Take 50 mg by mouth every morning.    11/23/2018 at Unknown time  . diphenoxylate-atropine (LOMOTIL) 2.5-0.025 MG tablet Take by mouth 4 (four) times daily as needed for diarrhea or loose stools.   Not Taking   Scheduled: . ALPRAZolam  1 mg Oral QHS  . Chlorhexidine Gluconate Cloth  6 each Topical Daily  . enoxaparin (LOVENOX) injection  1 mg/kg Subcutaneous Q12H  . pantoprazole  40 mg Oral Daily  . sertraline  50 mg Oral q morning - 10a  . sodium chloride flush  3 mL Intravenous Q12H   Continuous: . sodium chloride    . diltiazem (CARDIZEM) infusion 10 mg/hr (11/25/18 0733)   TDV:VOHYWV chloride, acetaminophen **OR** acetaminophen, HYDROcodone-acetaminophen, sodium chloride flush  Assesment: She was admitted with atrial fib with rapid ventricular response.  Chest x-ray that I personally reviewed shows cardiomegaly and some question of some venous congestion.  She says she feels better but she is confused this morning.  She has had a  fracture of her wrist and multiple falls likely related to atrial fib although that has not been  demonstrated until she arrived in the emergency room yesterday  She has chronic back pain and typically does not have to take much pain medication but she has been taking more recently because of the wrist fracture  She has both anxiety and depression which seems to be stable Active Problems:   Atrial fibrillation with RVR (HCC)   Chronic back pain   Anxiety and depression    Plan: Continue treatments.  Cardiology consultation.  Echocardiogram.    LOS: 0 days   Alonza Bogus 11/25/2018, 7:42 AM

## 2018-11-25 NOTE — Progress Notes (Signed)
*  PRELIMINARY RESULTS* Echocardiogram 2D Echocardiogram has been performed.  Kristy Mckinney 11/25/2018, 2:30 PM

## 2018-11-25 NOTE — Evaluation (Signed)
Physical Therapy Evaluation Patient Details Name: Kristy Mckinney MRN: [id number] DOB: 1932-03-08 Today's Date: 11/25/2018   History of Present Illness   Kristy Mckinney is a 85 y.o. female with medical history of chronic back pain who states she is here for dizziness. She has not had any other neurological symptoms but states she is getting very dizzy when she walks and needs to hold on to things in her apartment to keep from falling. She has fallen about 4 times in the past 4-5 weeks mainly because of this dizziness. The other night when she laid down to bed, she felt like she was short of breath and could not catch her breath. She has not noted any palpitations or chest pain. She is found to have A-fib.     Clinical Impression  Patient functioning near baseline for functional mobility and gait, has to lean on nearby objects for support which is baseline per patient, transferred to commode to have a bowel movement prior to ambulation in hallway, required use of RW for safety and tolerated sitting up in chair after therapy.  Patient will benefit from continued physical therapy in hospital and recommended venue below to increase strength, balance, endurance for safe ADLs and gait.     Follow Up Recommendations Outpatient PT;Supervision - Intermittent    Equipment Recommendations  Rolling walker with 5" wheels    Recommendations for Other Services       Precautions / Restrictions Precautions Precautions: Fall Restrictions Weight Bearing Restrictions: No      Mobility  Bed Mobility Overal bed mobility: Modified Independent             General bed mobility comments: increased time  Transfers Overall transfer level: Needs assistance Equipment used: Rolling walker (2 wheeled);None Transfers: Sit to/from [employer] to Stand: Min guard Stand pivot transfers: Min guard       General transfer comment: difficulty with sit to stands due to BLE  weakness  Ambulation/Gait Ambulation/Gait assistance: Min guard Gait Distance (Feet): 80 Feet Assistive device: Rolling walker (2 wheeled);None Gait Pattern/deviations: Decreased step length - right;Decreased step length - left;Decreased stride length Gait velocity: decreased   General Gait Details: slightly labored cadence with tendency to lean on nearby objects for support when not using AD, safer using RW and ambulated in hallways without loss of balance  Stairs            Wheelchair Mobility    Modified Rankin (Stroke Patients Only)       Balance Overall balance assessment: Needs assistance Sitting-balance support: Feet supported;No upper extremity supported Sitting balance-Leahy Scale: Fair Sitting balance - Comments: fair/good   Standing balance support: During functional activity;No upper extremity supported Standing balance-Leahy Scale: Poor Standing balance comment: fair/poor without AD, fair using RW                             Pertinent Vitals/Pain Pain Assessment: No/denies pain    Home Living Family/patient expects to be discharged to:: Private residence Living Arrangements: Alone Available Help at Discharge: Family Type of Home: Apartment Home Access: Level entry     Home Layout: One level Home Equipment: Cane - single point;Shower seat;Bedside commode      Prior Function Level of Independence: Independent with assistive device(s)         Comments: leans on walls and furniture in household, uses [facility] for community distances, occasionally drives     Hand Dominance  Dominant Hand: Right    Extremity/Trunk Assessment   Upper Extremity Assessment Upper Extremity Assessment: Generalized weakness    Lower Extremity Assessment Lower Extremity Assessment: Generalized weakness    Cervical / Trunk Assessment Cervical / Trunk Assessment: Kyphotic  Communication   Communication: No difficulties  Cognition Arousal/Alertness:  Awake/alert Behavior During Therapy: WFL for tasks assessed/performed Overall Cognitive Status: Within Functional Limits for tasks assessed                                        General Comments      Exercises     Assessment/Plan    PT Assessment Patient needs continued PT services  PT Problem List Decreased strength;Decreased activity tolerance;Decreased balance;Decreased mobility       PT Treatment Interventions Therapeutic exercise;Gait training;Stair training;Functional mobility training;Therapeutic activities;Patient/family education    PT Goals (Current goals can be found in the Care Plan section)  Acute Rehab PT Goals Patient Stated Goal: return home with daughter to assist PT Goal Formulation: With patient Time For Goal Achievement: 11/29/18 Potential to Achieve Goals: Good    Frequency Min 3X/week   Barriers to discharge        Co-evaluation               AM-PAC PT "6 Clicks" Mobility  Outcome Measure Help needed turning from your back to your side while in a flat bed without using bedrails?: None Help needed moving from lying on your back to sitting on the side of a flat bed without using bedrails?: None Help needed moving to and from a bed to a chair (including a wheelchair)?: A Little Help needed standing up from a chair using your arms (e.g., wheelchair or bedside chair)?: A Little Help needed to walk in hospital room?: A Little Help needed climbing 3-5 steps with a railing? : A Lot 6 Click Score: 19    End of Session   Activity Tolerance: Patient tolerated treatment well;Patient limited by fatigue Patient left: in chair;with call bell/phone within reach Nurse Communication: Mobility status PT Visit Diagnosis: Unsteadiness on feet (R26.81);Other abnormalities of gait and mobility (R26.89);Muscle weakness (generalized) (M62.81)    Time: 1610-9604 PT Time Calculation (min) (ACUTE ONLY): 31 min   Charges:   PT Evaluation $PT  Eval Moderate Complexity: 1 Mod PT Treatments $Therapeutic Activity: 23-37 mins        1:08 PM, 11/25/18 Lonell Grandchild, MPT Physical Therapist with [facility] [phone] office [phone] mobile phone

## 2018-11-26 DIAGNOSIS — I059 Rheumatic mitral valve disease, unspecified: Secondary | ICD-10-CM

## 2018-11-26 DIAGNOSIS — R296 Repeated falls: Secondary | ICD-10-CM

## 2018-11-26 MED ORDER — ALPRAZOLAM 0.5 MG PO TABS
0.5000 mg | ORAL_TABLET | Freq: Every day | ORAL | Status: DC
  Administered 2018-11-26: 21:00:00 0.5 mg via ORAL
  Filled 2018-11-26: qty 1

## 2018-11-26 MED ORDER — POTASSIUM CHLORIDE CRYS ER 20 MEQ PO TBCR
40.0000 meq | EXTENDED_RELEASE_TABLET | Freq: Once | ORAL | Status: AC
  Administered 2018-11-26: 08:00:00 40 meq via ORAL
  Filled 2018-11-26: qty 2

## 2018-11-26 MED ORDER — OFF THE BEAT BOOK
Freq: Once | Status: AC
  Administered 2018-11-27: 09:00:00
  Filled 2018-11-26 (×2): qty 1

## 2018-11-26 MED ORDER — DILTIAZEM HCL ER COATED BEADS 240 MG PO CP24
240.0000 mg | ORAL_CAPSULE | Freq: Every day | ORAL | Status: DC
  Administered 2018-11-26 – 2018-11-27 (×2): 240 mg via ORAL
  Filled 2018-11-26 (×2): qty 1

## 2018-11-26 MED ORDER — POTASSIUM CHLORIDE CRYS ER 20 MEQ PO TBCR: 20.0000 meq | EXTENDED_RELEASE_TABLET | Freq: Two times a day (BID) | ORAL | Status: DC

## 2018-11-26 MED ORDER — APIXABAN 2.5 MG PO TABS
2.5000 mg | ORAL_TABLET | Freq: Two times a day (BID) | ORAL | Status: DC
  Administered 2018-11-26 – 2018-11-27 (×3): 2.5 mg via ORAL
  Filled 2018-11-26 (×3): qty 1

## 2018-11-26 NOTE — Progress Notes (Signed)
Pt resting really well. O2 sats were dropping to 89 and occasionally 88%. Placed patient on 1L O2 to maintain oxygen. Will continue to monitor patient.

## 2018-11-26 NOTE — Progress Notes (Signed)
Assisted tele visit to patient with daughter.  Mischelle Reeg Anderson, RN   

## 2018-11-26 NOTE — Progress Notes (Signed)
Physical Therapy Treatment Patient Details Name: Kristy Mckinney MRN: [id number] DOB: [birth year] Today's Date: 11/26/2018    History of Present Illness  Kristy Mckinney is a 85 y.o. female with medical history of chronic back pain who states she is here for dizziness. She has not had any other neurological symptoms but states she is getting very dizzy when she walks and needs to hold on to things in her apartment to keep from falling. She has fallen about 4 times in the past 4-5 weeks mainly because of this dizziness. The other night when she laid down to bed, she felt like she was short of breath and could not catch her breath. She has not noted any palpitations or chest pain. She is found to have A-fib.     PT Comments    Patient demonstrates slightly increased endurance/distance for gait training without loss of balance, limited mostly due to c/o fatiguing of BUE when leaning on RW.  Patient demonstrates good return for completing exercises, but required 2-3 minute rest breaks in between sets.  Patient continued sitting up in chair after therapy.  Patient will benefit from continued physical therapy in hospital and recommended venue below to increase strength, balance, endurance for safe ADLs and gait.    Follow Up Recommendations  Outpatient PT;Supervision - Intermittent     Equipment Recommendations       Recommendations for Other Services       Precautions / Restrictions Precautions Precautions: Fall Restrictions Weight Bearing Restrictions: No    Mobility  Bed Mobility               General bed mobility comments: Presents up in chair (assisted by nursing staff)  Transfers Overall transfer level: Needs assistance Equipment used: Rolling walker (2 wheeled) Transfers: Sit to/from [facility] Sit to Stand: Supervision Stand pivot transfers: Min guard       General transfer comment: demonstrates increased BLE strength for completing sit to stands  with less labored movement  Ambulation/Gait Ambulation/Gait assistance: Min guard;Supervision Gait Distance (Feet): 90 Feet Assistive device: Rolling walker (2 wheeled) Gait Pattern/deviations: Decreased step length - right;Decreased step length - left;Decreased stride length Gait velocity: decreased   General Gait Details: slightly labored cadence without loss of balance, limited secondary to c/o fatigue   Stairs             Wheelchair Mobility    Modified Rankin (Stroke Patients Only)       Balance Overall balance assessment: Needs assistance Sitting-balance support: Feet supported;No upper extremity supported Sitting balance-Leahy Scale: Fair Sitting balance - Comments: fair/good   Standing balance support: During functional activity;Bilateral upper extremity supported Standing balance-Leahy Scale: Fair Standing balance comment: fair using RW                            Cognition Arousal/Alertness: Awake/alert Behavior During Therapy: WFL for tasks assessed/performed Overall Cognitive Status: Within Functional Limits for tasks assessed                                        Exercises General Exercises - Lower Extremity Long Arc Quad: Seated;Strengthening;AROM;Both;20 reps Hip Flexion/Marching: Seated;Strengthening;AROM;Both;20 reps Toe Raises: Seated;Strengthening;AROM;Both;15 reps Heel Raises: Seated;AROM;Strengthening;Both;15 reps    General Comments        Pertinent Vitals/Pain Pain Assessment: No/denies pain    Home Living  Prior Function            PT Goals (current goals can now be found in the care plan section) Acute Rehab PT Goals Patient Stated Goal: return home with daughter to assist PT Goal Formulation: With patient Time For Goal Achievement: 11/29/18 Potential to Achieve Goals: Good Progress towards PT goals: Progressing toward goals    Frequency    Min 3X/week       PT Plan      Co-evaluation              AM-PAC PT "6 Clicks" Mobility   Outcome Measure  Help needed turning from your back to your side while in a flat bed without using bedrails?: None Help needed moving from lying on your back to sitting on the side of a flat bed without using bedrails?: None Help needed moving to and from a bed to a chair (including a wheelchair)?: A Little Help needed standing up from a chair using your arms (e.g., wheelchair or bedside chair)?: None Help needed to walk in hospital room?: A Little Help needed climbing 3-5 steps with a railing? : A Little 6 Click Score: 21    End of Session   Activity Tolerance: Patient tolerated treatment well;Patient limited by fatigue Patient left: in chair;with call bell/phone within reach Nurse Communication: Mobility status PT Visit Diagnosis: Unsteadiness on feet (R26.81);Other abnormalities of gait and mobility (R26.89);Muscle weakness (generalized) (M62.81)     Time: 1103-1594 PT Time Calculation (min) (ACUTE ONLY): 29 min  Charges:  $Gait Training: 8-22 mins $Therapeutic Exercise: 8-22 mins                     12:56 PM, 11/26/18 Lonell Grandchild, MPT Physical Therapist with [facility] [phone] office [phone] mobile phone

## 2018-11-26 NOTE — Progress Notes (Signed)
Subjective: She is more alert this morning.  Her heart rate is well controlled.  She has no complaints except she has a "crick" in her neck.  She was seen by cardiology and their help is noted and appreciated.  She was seen by PT and it was recommended that she have home health physical therapy.  Echocardiogram shows good left ventricular systolic function but some right ventricular dysfunction and dilated left and right atrium.  CT did not show any strokes  Objective: Vital signs in last 24 hours: Temp:  [97.4 F (36.3 C)-98.6 F (37 C)] 97.9 F (36.6 C) (05/19 0753) Pulse Rate:  [49-120] 77 (05/19 0753) Resp:  [15-32] 17 (05/19 0753) BP: (85-133)/(50-79) 119/57 (05/19 0700) SpO2:  [87 %-98 %] 90 % (05/19 0753) Weight:  [66.3 kg] 66.3 kg (05/19 0500) Weight change: 2.343 kg Last BM Date: 11/23/18  Intake/Output from previous day: 05/18 0701 - 05/19 0700 In: 27 [I.V.:27] Out: -   PHYSICAL EXAM General appearance: alert, cooperative and no distress Resp: clear to auscultation bilaterally Cardio: irregularly irregular rhythm GI: soft, non-tender; bowel sounds normal; no masses,  no organomegaly Extremities: extremities normal, atraumatic, no cyanosis or edema  Lab Results:  Results for orders placed or performed during the hospital encounter of 11/24/18 (from the past 48 hour(s))  CBC with Differential     Status: Abnormal   Collection Time: 11/24/18 11:35 AM  Result Value Ref Range   WBC 6.9 4.0 - 10.5 K/uL   RBC 4.69 3.87 - 5.11 MIL/uL   Hemoglobin 12.5 12.0 - 15.0 g/dL   HCT 40.0 36.0 - 46.0 %   MCV 85.3 80.0 - 100.0 fL   MCH 26.7 26.0 - 34.0 pg   MCHC 31.3 30.0 - 36.0 g/dL   RDW 16.6 (H) 11.5 - 15.5 %   Platelets 256 150 - 400 K/uL   nRBC 0.0 0.0 - 0.2 %   Neutrophils Relative % 76 %   Neutro Abs 5.2 1.7 - 7.7 K/uL   Lymphocytes Relative 16 %   Lymphs Abs 1.1 0.7 - 4.0 K/uL   Monocytes Relative 6 %   Monocytes Absolute 0.4 0.1 - 1.0 K/uL   Eosinophils Relative 1 %    Eosinophils Absolute 0.1 0.0 - 0.5 K/uL   Basophils Relative 1 %   Basophils Absolute 0.1 0.0 - 0.1 K/uL   Immature Granulocytes 0 %   Abs Immature Granulocytes 0.02 0.00 - 0.07 K/uL    Comment: Performed at [facility], [address], [place], [place] [address]  Comprehensive metabolic panel     Status: Abnormal   Collection Time: 11/24/18 11:35 AM  Result Value Ref Range   Sodium 142 135 - 145 mmol/L   Potassium 3.7 3.5 - 5.1 mmol/L   Chloride 103 98 - 111 mmol/L   CO2 25 22 - 32 mmol/L   Glucose, Bld 102 (H) 70 - 99 mg/dL   BUN 18 8 - 23 mg/dL   Creatinine, Ser 0.82 0.44 - 1.00 mg/dL   Calcium 9.5 8.9 - 10.3 mg/dL   Total Protein 7.1 6.5 - 8.1 g/dL   Albumin 4.1 3.5 - 5.0 g/dL   AST 30 15 - 41 U/L   ALT 20 0 - 44 U/L   Alkaline Phosphatase 59 38 - 126 U/L   Total Bilirubin 2.2 (H) 0.3 - 1.2 mg/dL   GFR calc non Af Amer >60 >60 mL/min   GFR calc Af Amer >60 >60 mL/min   Anion gap 14  5 - 15    Comment: Performed at [facility], [address], [place], [place] [address]  Troponin I - Once     Status: None   Collection Time: 11/24/18 11:35 AM  Result Value Ref Range   Troponin I <0.03 <0.03 ng/mL    Comment: Performed at [facility], [address], [place], [place] [address]  SARS Coronavirus 2 (CEPHEID - Performed in [place] hospital lab), Hosp Order     Status: None   Collection Time: 11/24/18  4:13 PM  Result Value Ref Range   SARS Coronavirus 2 NEGATIVE NEGATIVE    Comment: (NOTE) If result is NEGATIVE SARS-CoV-2 target nucleic acids are NOT DETECTED. The SARS-CoV-2 RNA is generally detectable in upper and lower  respiratory specimens during the acute phase of infection. The lowest  concentration of SARS-CoV-2 viral copies this assay can detect is 250  copies / mL. A negative result does not preclude SARS-CoV-2 infection  and should not be used as the sole basis for treatment or other  patient management decisions.  A negative result may occur with  improper  specimen collection / handling, submission of specimen other  than nasopharyngeal swab, presence of viral mutation(s) within the  areas targeted by this assay, and inadequate number of viral copies  (<250 copies / mL). A negative result must be combined with clinical  observations, patient history, and epidemiological information. If result is POSITIVE SARS-CoV-2 target nucleic acids are DETECTED. The SARS-CoV-2 RNA is generally detectable in upper and lower  respiratory specimens dur ing the acute phase of infection.  Positive  results are indicative of active infection with SARS-CoV-2.  Clinical  correlation with patient history and other diagnostic information is  necessary to determine patient infection status.  Positive results do  not rule out bacterial infection or co-infection with other viruses. If result is PRESUMPTIVE POSTIVE SARS-CoV-2 nucleic acids MAY BE PRESENT.   A presumptive positive result was obtained on the submitted specimen  and confirmed on repeat testing.  While 2019 novel coronavirus  (SARS-CoV-2) nucleic acids may be present in the submitted sample  additional confirmatory testing may be necessary for epidemiological  and / or clinical management purposes  to differentiate between  SARS-CoV-2 and other Sarbecovirus currently known to infect humans.  If clinically indicated additional testing with an alternate test  methodology [phone]) is advised. The SARS-CoV-2 RNA is generally  detectable in upper and lower respiratory sp ecimens during the acute  phase of infection. The expected result is Negative. Fact Sheet for Patients:  StrictlyIdeas.no Fact Sheet for Healthcare Providers: BankingDealers.co.za This test is not yet approved or cleared by the [place] FDA and has been authorized for detection and/or diagnosis of SARS-CoV-2 by FDA under an Emergency Use Authorization (EUA).  This EUA will remain in  effect (meaning this test can be used) for the duration of the COVID-19 declaration under Section 564(b)(1) of the Act, 21 U.S.C. section 360bbb-3(b)(1), unless the authorization is terminated or revoked sooner. Performed at [facility], [address] [place], [place] [address]   MRSA PCR Screening     Status: None   Collection Time: 11/24/18  6:21 PM  Result Value Ref Range   MRSA by PCR NEGATIVE NEGATIVE    Comment:        The GeneXpert MRSA Assay (FDA approved for NASAL specimens only), is one component of a comprehensive MRSA colonization surveillance program. It is not intended to diagnose MRSA infection nor to guide or  monitor treatment for MRSA infections. Performed at [facility], [address], [place], [place] [address]   TSH     Status: None   Collection Time: 11/25/18  4:01 AM  Result Value Ref Range   TSH 1.615 0.350 - 4.500 uIU/mL    Comment: Performed by a 3rd Generation assay with a functional sensitivity of <=0.01 uIU/mL. Performed at [facility] [address] [place], [place] [address]   Basic metabolic panel     Status: Abnormal   Collection Time: 11/25/18  7:35 AM  Result Value Ref Range   Sodium 141 135 - 145 mmol/L   Potassium 3.4 (L) 3.5 - 5.1 mmol/L   Chloride 104 98 - 111 mmol/L   CO2 26 22 - 32 mmol/L   Glucose, Bld 95 70 - 99 mg/dL   BUN 14 8 - 23 mg/dL   Creatinine, Ser 0.71 0.44 - 1.00 mg/dL   Calcium 8.9 8.9 - 10.3 mg/dL   GFR calc non Af Amer >60 >60 mL/min   GFR calc Af Amer >60 >60 mL/min   Anion gap 11 5 - 15    Comment: Performed at [facility], [address] [place], [place] [address]  Magnesium     Status: Abnormal   Collection Time: 11/25/18  7:35 AM  Result Value Ref Range   Magnesium 1.5 (L) 1.7 - 2.4 mg/dL    Comment: Performed at [facility], [address], [place], [place] [address]    ABGS No results for input(s): PHART, PO2ART, TCO2, HCO3 in the last 72 hours.  Invalid input(s): PCO2 CULTURES Recent Results  (from the past 240 hour(s))  SARS Coronavirus 2 (CEPHEID - Performed in [place] hospital lab), Hosp Order     Status: None   Collection Time: 11/24/18  4:13 PM  Result Value Ref Range Status   SARS Coronavirus 2 NEGATIVE NEGATIVE Final    Comment: (NOTE) If result is NEGATIVE SARS-CoV-2 target nucleic acids are NOT DETECTED. The SARS-CoV-2 RNA is generally detectable in upper and lower  respiratory specimens during the acute phase of infection. The lowest  concentration of SARS-CoV-2 viral copies this assay can detect is 250  copies / mL. A negative result does not preclude SARS-CoV-2 infection  and should not be used as the sole basis for treatment or other  patient management decisions.  A negative result may occur with  improper specimen collection / handling, submission of specimen other  than nasopharyngeal swab, presence of viral mutation(s) within the  areas targeted by this assay, and inadequate number of viral copies  (<250 copies / mL). A negative result must be combined with clinical  observations, patient history, and epidemiological information. If result is POSITIVE SARS-CoV-2 target nucleic acids are DETECTED. The SARS-CoV-2 RNA is generally detectable in upper and lower  respiratory specimens dur ing the acute phase of infection.  Positive  results are indicative of active infection with SARS-CoV-2.  Clinical  correlation with patient history and other diagnostic information is  necessary to determine patient infection status.  Positive results do  not rule out bacterial infection or co-infection with other viruses. If result is PRESUMPTIVE POSTIVE SARS-CoV-2 nucleic acids MAY BE PRESENT.   A presumptive positive result was obtained on the submitted specimen  and confirmed on repeat testing.  While 2019 novel coronavirus  (SARS-CoV-2) nucleic acids may be present in the submitted sample  additional confirmatory testing may be necessary for epidemiological  and /  or clinical management purposes  to differentiate between  SARS-CoV-2 and other Sarbecovirus currently known to infect humans.  If clinically indicated additional testing with an alternate test  methodology [phone]) is advised. The SARS-CoV-2 RNA is generally  detectable in upper and lower respiratory sp ecimens during the acute  phase of infection. The expected result is Negative. Fact Sheet for Patients:  StrictlyIdeas.no Fact Sheet for Healthcare Providers: BankingDealers.co.za This test is not yet approved or cleared by the [place] FDA and has been authorized for detection and/or diagnosis of SARS-CoV-2 by FDA under an Emergency Use Authorization (EUA).  This EUA will remain in effect (meaning this test can be used) for the duration of the COVID-19 declaration under Section 564(b)(1) of the Act, 21 U.S.C. section 360bbb-3(b)(1), unless the authorization is terminated or revoked sooner. Performed at [facility], [address], [place], [place] [address]   MRSA PCR Screening     Status: None   Collection Time: 11/24/18  6:21 PM  Result Value Ref Range Status   MRSA by PCR NEGATIVE NEGATIVE Final    Comment:        The GeneXpert MRSA Assay (FDA approved for NASAL specimens only), is one component of a comprehensive MRSA colonization surveillance program. It is not intended to diagnose MRSA infection nor to guide or monitor treatment for MRSA infections. Performed at [facility], [address] [place], [place] [address]    Studies/Results: Ct Head Wo Contrast  Result Date: 11/25/2018 CLINICAL DATA:  Altered mental status.  Recent falls EXAM: CT HEAD WITHOUT CONTRAST TECHNIQUE: Contiguous axial images were obtained from the base of the skull through the vertex without intravenous contrast. COMPARISON:  None. FINDINGS: Brain: There is mild diffuse atrophy. There is no intracranial mass, hemorrhage, extra-axial fluid  collection, or midline shift. There is patchy small vessel disease in the centra semiovale bilaterally. No acute appearing infarct is demonstrable on this study. Vascular: There is no appreciable hyperdense vessel. There is calcification in the right carotid siphon region as well as in the distal right vertebral artery. Skull: The bony calvarium appears intact. Sinuses/Orbits: Visualized paranasal sinuses are clear. Orbits appear symmetric bilaterally. Other: Mastoid air cells clear. IMPRESSION: Mild atrophy with patchy periventricular small vessel disease. No evident acute infarct. No mass or hemorrhage. Foci of arterial vascular calcification noted. Electronically Signed   By: Lowella Grip III M.D.   On: 11/25/2018 10:56   Dg Chest Port 1 View  Result Date: 11/24/2018 CLINICAL DATA:  Weakness.  Atrial fibrillation. EXAM: PORTABLE CHEST 1 VIEW COMPARISON:  01/29/2014 FINDINGS: The heart size is enlarged. There is aortic atherosclerosis. Bilateral pleural effusions and mild pulmonary edema identified. The atelectasis identified within the left midlung. IMPRESSION: 1. Cardiac enlargement, aortic atherosclerosis and congestive heart failure. 2.  Aortic Atherosclerosis (ICD10-I70.0). Electronically Signed   By: Kerby Moors M.D.   On: 11/24/2018 12:19    Medications:  Prior to Admission:  Medications Prior to Admission  Medication Sig Dispense Refill Last Dose  . alendronate (FOSAMAX) 70 MG tablet Take 70 mg by mouth once a week. Take with a full glass of water on an empty stomach.   Past Week at Unknown time  . ALPRAZolam (XANAX) 1 MG tablet Take 1 mg by mouth at bedtime.   11/23/2018 at Unknown time  . Biotin w/ Vitamins C & E (HAIR/SKIN/NAILS PO) Take 1 tablet by mouth daily.   11/23/2018 at Unknown time  . calcium-vitamin D (OS-CAL 500 + D) 500-200 MG-UNIT per tablet Take 1 tablet by  mouth 2 (two) times daily.    11/23/2018 at Unknown time  . HYDROcodone-acetaminophen (NORCO) 10-325 MG tablet 1  tablet 2 (two) times daily as needed.   Past Week at Unknown time  . loperamide (IMODIUM) 2 MG capsule Take by mouth as needed for diarrhea or loose stools.   Not Taking  . Multiple Vitamin (MULTIVITAMIN WITH MINERALS) TABS Take 1 tablet by mouth daily.   11/23/2018 at Unknown time  . omeprazole (PRILOSEC) 20 MG capsule Take 20 mg by mouth every morning.    11/23/2018 at Unknown time  . Polyethyl Glycol-Propyl Glycol (SYSTANE OP) Apply 1 drop to eye daily as needed. Dry Eyes   11/23/2018 at Unknown time  . sertraline (ZOLOFT) 50 MG tablet Take 50 mg by mouth every morning.    11/23/2018 at Unknown time  . diphenoxylate-atropine (LOMOTIL) 2.5-0.025 MG tablet Take by mouth 4 (four) times daily as needed for diarrhea or loose stools.   Not Taking   Scheduled: . ALPRAZolam  0.5 mg Oral QHS  . Chlorhexidine Gluconate Cloth  6 each Topical Daily  . diltiazem  60 mg Oral Q6H  . enoxaparin (LOVENOX) injection  1 mg/kg Subcutaneous Q12H  . pantoprazole  40 mg Oral Daily  . potassium chloride  40 mEq Oral Once  . sertraline  50 mg Oral q morning - 10a  . sodium chloride flush  3 mL Intravenous Q12H   Continuous: . sodium chloride    . diltiazem (CARDIZEM) infusion Stopped (11/25/18 0857)   MCN:OBSJGG chloride, acetaminophen **OR** acetaminophen, HYDROcodone-acetaminophen, sodium chloride flush  Assesment: She was admitted with atrial fib with RVR.  She has been having falls at home which may be related to that.  I am going to have her get orthostatic vital signs in the meantime just to be sure that she is not having significant drop in her blood pressure when she stands.  Her atrial fib is better controlled on diltiazem.  Left ventricular systolic function looks okay.  She does have some right ventricular dilation and she has dilated left and right atria.  She has some mitral valve calcification.  She has generalized weakness and PT has recommended that she do home health physical therapy  She has  chronic back pain which is about the same  She has anxiety and I think that if doing okay  She has had multiple falls.  She has fractured her wrist.  She is supposed to be on treatment for osteoporosis with alendronate and I will make sure she is taking it  She is mildly hypokalemic and that will be replaced Active Problems:   Atrial fibrillation with RVR (HCC)   Chronic back pain   Anxiety and depression   Atrial fibrillation with rapid ventricular response ([place])    Plan: Continue treatments.  Discussed with daughter by phone    LOS: 1 day   Alonza Bogus 11/26/2018, 7:55 AM

## 2018-11-26 NOTE — Progress Notes (Signed)
Patients son-in-law called for updates and expressed concern for "increased" confusion. Advised him that patient did seem a little confused but according to previous report, it was consistent. The patient had a really good night, she slept all night and is currently still sleeping.

## 2018-11-26 NOTE — Progress Notes (Signed)
Progress Note  Patient Name: Kristy Mckinney Date of Encounter: 11/26/2018  Primary Cardiologist: Satira Sark, MD  Subjective   Eating breakfast this morning.  Denies any chest pain or palpitations.  No shortness of breath at rest.  Inpatient Medications    Scheduled Meds: . ALPRAZolam  0.5 mg Oral QHS  . Chlorhexidine Gluconate Cloth  6 each Topical Daily  . diltiazem  60 mg Oral Q6H  . enoxaparin (LOVENOX) injection  1 mg/kg Subcutaneous Q12H  . pantoprazole  40 mg Oral Daily  . sertraline  50 mg Oral q morning - 10a  . sodium chloride flush  3 mL Intravenous Q12H   Continuous Infusions: . sodium chloride    . diltiazem (CARDIZEM) infusion Stopped (11/25/18 0857)   PRN Meds: sodium chloride, acetaminophen **OR** acetaminophen, HYDROcodone-acetaminophen, sodium chloride flush   Vital Signs    Vitals:   11/26/18 0700 11/26/18 0753 11/26/18 0800 11/26/18 0817  BP: (!) 119/57  (!) 120/52   Pulse: 86 77 75   Resp: (!) 32 17 17   Temp:  97.9 F (36.6 C)    TempSrc:  Oral    SpO2: 94% 90%  96%  Weight:      Height:        Intake/Output Summary (Last 24 hours) at 11/26/2018 0828 Last data filed at 11/25/2018 1300 Gross per 24 hour  Intake 11.5 ml  Output -  Net 11.5 ml   Filed Weights   11/24/18 1800 11/25/18 0500 11/26/18 0500  Weight: 65.9 kg 65.7 kg 66.3 kg    Telemetry    Atrial fibrillation, rare PVCs.  Personally reviewed.  ECG    Rate controlled atrial fibrillation with low voltage, decreased R wave progression with R' in lead V1 and V2, rule out old inferior infarct pattern.  Personally reviewed.  Physical Exam   GEN:  Elderly woman.  No acute distress.   Neck: No JVD. Cardiac:  Irregularly irregular, soft systolic murmur, no gallop.  Respiratory: Nonlabored. Clear to auscultation bilaterally. GI: Soft, nontender, bowel sounds present. MS: No edema; No deformity. Neuro:  Nonfocal. Psych: Alert and oriented x 2. Normal affect.   Labs    Chemistry Recent Labs  Lab 11/24/18 1135 11/25/18 0735  NA 142 141  K 3.7 3.4*  CL 103 104  CO2 25 26  GLUCOSE 102* 95  BUN 18 14  CREATININE 0.82 0.71  CALCIUM 9.5 8.9  PROT 7.1  --   ALBUMIN 4.1  --   AST 30  --   ALT 20  --   ALKPHOS 59  --   BILITOT 2.2*  --   GFRNONAA >60 >60  GFRAA >60 >60  ANIONGAP 14 11     Hematology Recent Labs  Lab 11/24/18 1135  WBC 6.9  RBC 4.69  HGB 12.5  HCT 40.0  MCV 85.3  MCH 26.7  MCHC 31.3  RDW 16.6*  PLT 256    Cardiac Enzymes Recent Labs  Lab 11/24/18 1135  TROPONINI <0.03   No results for input(s): TROPIPOC in the last 168 hours.   Radiology    Ct Head Wo Contrast  Result Date: 11/25/2018 CLINICAL DATA:  Altered mental status.  Recent falls EXAM: CT HEAD WITHOUT CONTRAST TECHNIQUE: Contiguous axial images were obtained from the base of the skull through the vertex without intravenous contrast. COMPARISON:  None. FINDINGS: Brain: There is mild diffuse atrophy. There is no intracranial mass, hemorrhage, extra-axial fluid collection, or midline shift. There is patchy  small vessel disease in the centra semiovale bilaterally. No acute appearing infarct is demonstrable on this study. Vascular: There is no appreciable hyperdense vessel. There is calcification in the right carotid siphon region as well as in the distal right vertebral artery. Skull: The bony calvarium appears intact. Sinuses/Orbits: Visualized paranasal sinuses are clear. Orbits appear symmetric bilaterally. Other: Mastoid air cells clear. IMPRESSION: Mild atrophy with patchy periventricular small vessel disease. No evident acute infarct. No mass or hemorrhage. Foci of arterial vascular calcification noted. Electronically Signed   By: Lowella Grip III M.D.   On: 11/25/2018 10:56   Dg Chest Port 1 View  Result Date: 11/24/2018 CLINICAL DATA:  Weakness.  Atrial fibrillation. EXAM: PORTABLE CHEST 1 VIEW COMPARISON:  01/29/2014 FINDINGS: The heart size  is enlarged. There is aortic atherosclerosis. Bilateral pleural effusions and mild pulmonary edema identified. The atelectasis identified within the left midlung. IMPRESSION: 1. Cardiac enlargement, aortic atherosclerosis and congestive heart failure. 2.  Aortic Atherosclerosis (ICD10-I70.0). Electronically Signed   By: Kerby Moors M.D.   On: 11/24/2018 12:19    Cardiac Studies   Echocardiogram 11/25/2018:  1. The left ventricle has hyperdynamic systolic function, with an ejection fraction of >65%. The cavity size was normal. Left ventricular diastolic Doppler parameters are indeterminate.  2. The right ventricle has moderately reduced systolic function. The cavity was mildly enlarged. There is no increase in right ventricular wall thickness.  3. Left atrial size was severely dilated.  4. Right atrial size was severely dilated.  5. The aortic valve is tricuspid. Mild thickening of the aortic valve. Moderate calcification of the aortic valve. Mild to moderate aortic annular calcification noted.  6. The mitral valve is abnormal. Moderate thickening of the mitral valve leaflet. Moderate calcification of the mitral valve leaflet. There is moderate mitral annular calcification present. Mild mitral valve stenosis.  7. MV is thickened, calcified with very mildly restricted motion.  8. The tricuspid valve is grossly normal. Tricuspid valve regurgitation is moderate.  9. The aortic root is normal in size and structure. 10. The inferior vena cava was dilated in size with <50% respiratory variability. 11. The interatrial septum was not assessed.  Patient Profile     85 y.o. female with GERD, chronic back pain, osteoporosis, presenting in the setting of intermittent falls and newly documented atrial fibrillation with RVR.  Assessment & Plan    1.  Newly documented atrial fibrillation of uncertain duration.  Heart rate control is better on Cardizem and she is less symptomatic in terms of palpitations  and shortness of breath.  CHADSVASC score is 4.  Echocardiogram reveals LVEF greater than 65%.  She does have severe biatrial enlargement.  TSH is normal and troponin I levels argue against ACS.  2.  Very mild mitral stenosis with calcified valve.  This would not specifically represent a reason not to use DOAC.  3.  Dizziness with intermittent falls.  Orthostatics are being checked.  Her blood pressure has otherwise been stable.  No obvious evidence of acute stroke by head CT.  PT evaluation recommends home health.  Plan is to change short acting Cardizem to Cardizem CD 240 mg daily, first dose this morning.  Also stop Lovenox and initiate Eliquis, although will start with 2.5 mg twice daily to make sure that she tolerates this before going to 5 mg twice daily as an outpatient. Technically speaking her weight is not low enough to require use of 2.5 mg twice daily now, but she is borderline (64 kg)  and with her history of falls we will start cautiously.  Increase activity.  If she is discharged home today, we will need to reassess with a telehealth encounter within 2 weeks.  I will ask our office to arrange.  Signed, Rozann Lesches, MD  11/26/2018, 8:28 AM

## 2018-11-26 NOTE — TOC Benefit Eligibility Note (Signed)
Transition of Care [facility]) Benefit Eligibility Note    Patient Details  Name: Kristy Mckinney MRN: [id number] Date of Birth: 03-Oct-1931   Medication/Dose: eliquis 2.5mg  twice a day  Covered?: Yes  Tier: 3 Drug  Prescription Coverage Preferred Pharmacy: can go to any retail pharmacy or do [facility] mail order  Spoke with Person/Company/Phone Number:: Tamela at [facility] at [phone] opt 2  Co-Pay: $45 for a 30 day supply, $90 for 2 or 3 month supply  Prior Approval: No  Deductible: (no annual ded)       Tommy Medal Phone Number: 11/26/2018, 4:35 PM

## 2018-11-27 MED ORDER — APIXABAN 2.5 MG PO TABS: 2.5000 mg | ORAL_TABLET | Freq: Two times a day (BID) | ORAL | 0 refills | Status: DC

## 2018-11-27 MED ORDER — DILTIAZEM HCL ER COATED BEADS 240 MG PO CP24: 240.0000 mg | ORAL_CAPSULE | Freq: Every day | ORAL | 12 refills | Status: DC

## 2018-11-27 NOTE — Discharge Summary (Signed)
Physician Discharge Summary  Patient ID: Kristy Mckinney MRN: [id number] DOB/AGE: 83-Sep-1933 83 y.o. Primary Care Physician:Samantha Olivera, Percell Miller, MD Admit date: 11/24/2018 Discharge date: 11/27/2018    Discharge Diagnoses:   Active Problems:   Atrial fibrillation with RVR (HCC)   Chronic back pain   Anxiety and depression   Atrial fibrillation with rapid ventricular response (HCC)   Allergies as of 11/27/2018      Reactions   Penicillins Anaphylaxis   Feldene [piroxicam] Nausea And Vomiting   Sulfa Antibiotics    swelling      Medication List    TAKE these medications   alendronate 70 MG tablet Commonly known as:  FOSAMAX Take 70 mg by mouth once a week. Take with a full glass of water on an empty stomach.   ALPRAZolam 1 MG tablet Commonly known as:  XANAX Take 1 mg by mouth at bedtime.   apixaban 2.5 MG Tabs tablet Commonly known as:  ELIQUIS Take 1 tablet (2.5 mg total) by mouth 2 (two) times daily.   diltiazem 240 MG 24 hr capsule Commonly known as:  CARDIZEM CD Take 1 capsule (240 mg total) by mouth daily.   diphenoxylate-atropine 2.5-0.025 MG tablet Commonly known as:  LOMOTIL Take by mouth 4 (four) times daily as needed for diarrhea or loose stools.   HAIR/SKIN/NAILS PO Take 1 tablet by mouth daily.   HYDROcodone-acetaminophen 10-325 MG tablet Commonly known as:  NORCO 1 tablet 2 (two) times daily as needed.   loperamide 2 MG capsule Commonly known as:  IMODIUM Take by mouth as needed for diarrhea or loose stools.   multivitamin with minerals Tabs tablet Take 1 tablet by mouth daily.   omeprazole 20 MG capsule Commonly known as:  PRILOSEC Take 20 mg by mouth every morning.   Os-Cal 500 + D 500-200 MG-UNIT tablet Generic drug:  calcium-vitamin D Take 1 tablet by mouth 2 (two) times daily.   sertraline 50 MG tablet Commonly known as:  ZOLOFT Take 50 mg by mouth every morning.   SYSTANE OP Apply 1 drop to eye daily as needed. Dry Eyes             Durable Medical Equipment  (From admission, onward)         Start     Ordered   11/25/18 1345  For home use only DME Walker rolling  Once    Question:  Patient needs a walker to treat with the following condition  Answer:  Generalized weakness   11/25/18 1344          Discharged Condition: Improved    Consults: Cardiology, Dr. Domenic Polite  Significant Diagnostic Studies: Dg Wrist Complete Right  Result Date: 10/29/2018 CLINICAL DATA:  Recent fall with wrist pain, initial encounter EXAM: RIGHT WRIST - COMPLETE 3+ VIEW COMPARISON:  09/02/2018, 09/24/2018 FINDINGS: Carpal bones are within normal limits. The previously seen dorsal triquetral fracture is again noted without significant displacement. The small avulsion is again noted on the lateral film. No new focal abnormality is noted. IMPRESSION: No acute abnormality is noted. Lucency remains in triquetral bone consistent with the known fracture. Electronically Signed   By: Inez Catalina M.D.   On: 10/29/2018 12:20   Ct Head Wo Contrast  Result Date: 11/25/2018 CLINICAL DATA:  Altered mental status.  Recent falls EXAM: CT HEAD WITHOUT CONTRAST TECHNIQUE: Contiguous axial images were obtained from the base of the skull through the vertex without intravenous contrast. COMPARISON:  None. FINDINGS: Brain: There is mild diffuse  atrophy. There is no intracranial mass, hemorrhage, extra-axial fluid collection, or midline shift. There is patchy small vessel disease in the centra semiovale bilaterally. No acute appearing infarct is demonstrable on this study. Vascular: There is no appreciable hyperdense vessel. There is calcification in the right carotid siphon region as well as in the distal right vertebral artery. Skull: The bony calvarium appears intact. Sinuses/Orbits: Visualized paranasal sinuses are clear. Orbits appear symmetric bilaterally. Other: Mastoid air cells clear. IMPRESSION: Mild atrophy with patchy periventricular small vessel  disease. No evident acute infarct. No mass or hemorrhage. Foci of arterial vascular calcification noted. Electronically Signed   By: Lowella Grip III M.D.   On: 11/25/2018 10:56   Dg Chest Port 1 View  Result Date: 11/24/2018 CLINICAL DATA:  Weakness.  Atrial fibrillation. EXAM: PORTABLE CHEST 1 VIEW COMPARISON:  01/29/2014 FINDINGS: The heart size is enlarged. There is aortic atherosclerosis. Bilateral pleural effusions and mild pulmonary edema identified. The atelectasis identified within the left midlung. IMPRESSION: 1. Cardiac enlargement, aortic atherosclerosis and congestive heart failure. 2.  Aortic Atherosclerosis (ICD10-I70.0). Electronically Signed   By: Kerby Moors M.D.   On: 11/24/2018 12:19   Dg Hip Unilat With Pelvis 2-3 Views Left  Result Date: 11/14/2018 CLINICAL DATA:  Fall with left posterior hip pain. EXAM: DG HIP (WITH OR WITHOUT PELVIS) 2-3V LEFT COMPARISON:  None. FINDINGS: Frontal pelvis shows bony demineralization. SI joints and symphysis pubis unremarkable. AP and frog-leg lateral views of the left hip show a sclerotic line superimposed on the subcapital region of the femoral neck with evidence of femoral head spurring. No worrisome lytic or sclerotic osseous abnormality. IMPRESSION: Sclerotic line superimposed on the left femoral neck likely related to a collar of hypertrophic spurs from degenerative change. No definite femoral neck fracture evident. If there is high clinical index of suspicion, MRI would be the study of choice to further evaluate. Electronically Signed   By: Misty Stanley M.D.   On: 11/14/2018 12:21    Lab Results: Basic Metabolic Panel: Recent Labs    11/24/18 1135 11/25/18 0735  NA 142 141  K 3.7 3.4*  CL 103 104  CO2 25 26  GLUCOSE 102* 95  BUN 18 14  CREATININE 0.82 0.71  CALCIUM 9.5 8.9  MG  --  1.5*   Liver Function Tests: Recent Labs    11/24/18 1135  AST 30  ALT 20  ALKPHOS 59  BILITOT 2.2*  PROT 7.1  ALBUMIN 4.1      CBC: Recent Labs    11/24/18 1135  WBC 6.9  NEUTROABS 5.2  HGB 12.5  HCT 40.0  MCV 85.3  PLT 256    Recent Results (from the past 240 hour(s))  SARS Coronavirus 2 (CEPHEID - Performed in [facility] hospital lab), Hosp Order     Status: None   Collection Time: 11/24/18  4:13 PM  Result Value Ref Range Status   SARS Coronavirus 2 NEGATIVE NEGATIVE Final    Comment: (NOTE) If result is NEGATIVE SARS-CoV-2 target nucleic acids are NOT DETECTED. The SARS-CoV-2 RNA is generally detectable in upper and lower  respiratory specimens during the acute phase of infection. The lowest  concentration of SARS-CoV-2 viral copies this assay can detect is 250  copies / mL. A negative result does not preclude SARS-CoV-2 infection  and should not be used as the sole basis for treatment or other  patient management decisions.  A negative result may occur with  improper specimen collection / handling, submission of  specimen other  than nasopharyngeal swab, presence of viral mutation(s) within the  areas targeted by this assay, and inadequate number of viral copies  (<250 copies / mL). A negative result must be combined with clinical  observations, patient history, and epidemiological information. If result is POSITIVE SARS-CoV-2 target nucleic acids are DETECTED. The SARS-CoV-2 RNA is generally detectable in upper and lower  respiratory specimens dur ing the acute phase of infection.  Positive  results are indicative of active infection with SARS-CoV-2.  Clinical  correlation with patient history and other diagnostic information is  necessary to determine patient infection status.  Positive results do  not rule out bacterial infection or co-infection with other viruses. If result is PRESUMPTIVE POSTIVE SARS-CoV-2 nucleic acids MAY BE PRESENT.   A presumptive positive result was obtained on the submitted specimen  and confirmed on repeat testing.  While 2019 novel coronavirus   (SARS-CoV-2) nucleic acids may be present in the submitted sample  additional confirmatory testing may be necessary for epidemiological  and / or clinical management purposes  to differentiate between  SARS-CoV-2 and other Sarbecovirus currently known to infect humans.  If clinically indicated additional testing with an alternate test  methodology [phone]) is advised. The SARS-CoV-2 RNA is generally  detectable in upper and lower respiratory sp ecimens during the acute  phase of infection. The expected result is Negative. Fact Sheet for Patients:  StrictlyIdeas.no Fact Sheet for Healthcare Providers: BankingDealers.co.za This test is not yet approved or cleared by the [place] FDA and has been authorized for detection and/or diagnosis of SARS-CoV-2 by FDA under an Emergency Use Authorization (EUA).  This EUA will remain in effect (meaning this test can be used) for the duration of the COVID-19 declaration under Section 564(b)(1) of the Act, 21 U.S.C. section 360bbb-3(b)(1), unless the authorization is terminated or revoked sooner. Performed at [facility], [address] [place], [place] [address]   MRSA PCR Screening     Status: None   Collection Time: 11/24/18  6:21 PM  Result Value Ref Range Status   MRSA by PCR NEGATIVE NEGATIVE Final    Comment:        The GeneXpert MRSA Assay (FDA approved for NASAL specimens only), is one component of a comprehensive MRSA colonization surveillance program. It is not intended to diagnose MRSA infection nor to guide or monitor treatment for MRSA infections. Performed at [facility], [address] [place], [place] [address]      Hospital Course: This is an 85 year old who has had several falls in the last 2 months.  She fractured her hand.  She started having increasing problems with dizziness and eventually came to the emergency department where she was found to be in atrial fib  with rapid ventricular response.  This has not been a previous problem.  She was treated with diltiazem and heparin.  Cardiology consultation was obtained.  She had echocardiogram that showed good cardiac ejection fraction.  She was converted from IV diltiazem to oral diltiazem and then to long acting diltiazem and tolerated all of this well.  Heparin was converted to Eliquis she had PT consultation and it was suggested that she have outpatient physical therapy  Discharge Exam: Blood pressure 102/70, pulse (!) 59, temperature 98 F (36.7 C), resp. rate (!) 23, height 5\' 5"  (1.651 m), weight 66.7 kg, SpO2 95 %. She still in atrial fib but heart rate about 80.  Chest is clear.  Disposition: Home with home health.  Her daughter  says that someone will stay with her for the first few days that she is at home she is going to use a walker  Discharge Instructions    Ambulatory referral to Physical Therapy   Complete by:  As directed       Follow-up Information    [name], MD Follow up.   Specialty:  Pulmonary Disease Contact information: [address] [place] [address] [phone]        Satira Sark, MD Follow up.   Specialty:  Cardiology Why:  office will contact you for follow up with Dr Domenic Polite or Bernerd Pho PA in two weeks Contact information: [place] [address] [phone]           Signed: Alonza Bogus   11/27/2018, 7:55 AM

## 2018-11-27 NOTE — Progress Notes (Signed)
Reviewed d/c instructions, follow up care, and medication changes with pt at this time. Questions answered, no further concerns or questions at this time. Pt has all belongings returned to her. Cardiac book, Eliquis packet, and d/c instructions given to pt. Pt alert and oriented at this time. Escorted out of hospital in [facility] for d/c with daughter picking pt up.

## 2018-11-27 NOTE — TOC Transition Note (Addendum)
Transition of Care (TOC) - CM/SW Discharge Note   Patient Details  Name: Kristy Mckinney MRN: [id number] Date of Birth: 03/18/1932  Transition of Care (TOC) CM/SW Contact:  ,  Diane, RN Phone Number: 11/27/2018, 9:20 AM   Clinical Narrative:  Met with patient at bedside. Pharmacy has met with patient and provided Eliquis coupon. Discussed benefit results with patient for Eliquis co-pays. She can afford cost.  RW has been ordered and will be delivered to bedside today prior to DC.  Discussed home health vs out patient PT with patient. She lives in [address] apartments and still drives short distances. She would like referral to [facility] OP rehab as she lives a mile away and can drive herself. Also has daughters who help her and can drive her if needed.    ADDENDUM: Noted that ambulatory referral to PT was ordered by Dr. Hawkins on 11/25/18.    Final next level of care: OP Rehab Barriers to Discharge: No Barriers Identified   Patient Goals and CMS Choice Patient states their goals for this hospitalization and ongoing recovery are:: get back home  CMS Medicare.gov Compare Post Acute Care list provided to:: Patient Choice offered to / list presented to : Patient     Discharge Plan and Services   Discharge Planning Services: CM Consult              DME Agency: [employer] Date DME Agency Contacted: 11/26/18   Representative spoke with at DME Agency: Kathy Cheek notified by Kathy Vesico on 11/26/18        Readmission Risk Interventions No flowsheet data found.     

## 2018-11-27 NOTE — Progress Notes (Signed)
Subjective: She says she feels better.  No new complaints.  I think okay for discharge now.  Objective: Vital signs in last 24 hours: Temp:  [97.6 F (36.4 C)-98 F (36.7 C)] 98 F (36.7 C) (05/20 0500) Pulse Rate:  [59-112] 59 (05/20 0700) Resp:  [16-23] 23 (05/19 1000) BP: (102-129)/(52-105) 102/70 (05/20 0600) SpO2:  [88 %-97 %] 95 % (05/20 0600) Weight:  [66.7 kg] 66.7 kg (05/20 0500) Weight change: 0.4 kg Last BM Date: 11/23/18  Intake/Output from previous day: No intake/output data recorded.  PHYSICAL EXAM General appearance: alert, cooperative and no distress Resp: clear to auscultation bilaterally Cardio: irregularly irregular rhythm GI: soft, non-tender; bowel sounds normal; no masses,  no organomegaly Extremities: extremities normal, atraumatic, no cyanosis or edema  Lab Results:  No results found for this or any previous visit (from the past 48 hour(s)).  ABGS No results for input(s): PHART, PO2ART, TCO2, HCO3 in the last 72 hours.  Invalid input(s): PCO2 CULTURES Recent Results (from the past 240 hour(s))  SARS Coronavirus 2 (CEPHEID - Performed in [facility] hospital lab), Hosp Order     Status: None   Collection Time: 11/24/18  4:13 PM  Result Value Ref Range Status   SARS Coronavirus 2 NEGATIVE NEGATIVE Final    Comment: (NOTE) If result is NEGATIVE SARS-CoV-2 target nucleic acids are NOT DETECTED. The SARS-CoV-2 RNA is generally detectable in upper and lower  respiratory specimens during the acute phase of infection. The lowest  concentration of SARS-CoV-2 viral copies this assay can detect is 250  copies / mL. A negative result does not preclude SARS-CoV-2 infection  and should not be used as the sole basis for treatment or other  patient management decisions.  A negative result may occur with  improper specimen collection / handling, submission of specimen other  than nasopharyngeal swab, presence of viral mutation(s) within the  areas targeted  by this assay, and inadequate number of viral copies  (<250 copies / mL). A negative result must be combined with clinical  observations, patient history, and epidemiological information. If result is POSITIVE SARS-CoV-2 target nucleic acids are DETECTED. The SARS-CoV-2 RNA is generally detectable in upper and lower  respiratory specimens dur ing the acute phase of infection.  Positive  results are indicative of active infection with SARS-CoV-2.  Clinical  correlation with patient history and other diagnostic information is  necessary to determine patient infection status.  Positive results do  not rule out bacterial infection or co-infection with other viruses. If result is PRESUMPTIVE POSTIVE SARS-CoV-2 nucleic acids MAY BE PRESENT.   A presumptive positive result was obtained on the submitted specimen  and confirmed on repeat testing.  While 2019 novel coronavirus  (SARS-CoV-2) nucleic acids may be present in the submitted sample  additional confirmatory testing may be necessary for epidemiological  and / or clinical management purposes  to differentiate between  SARS-CoV-2 and other Sarbecovirus currently known to infect humans.  If clinically indicated additional testing with an alternate test  methodology [phone]) is advised. The SARS-CoV-2 RNA is generally  detectable in upper and lower respiratory sp ecimens during the acute  phase of infection. The expected result is Negative. Fact Sheet for Patients:  StrictlyIdeas.no Fact Sheet for Healthcare Providers: BankingDealers.co.za This test is not yet approved or cleared by the [place] FDA and has been authorized for detection and/or diagnosis of SARS-CoV-2 by FDA under an Emergency Use Authorization (EUA).  This EUA will remain in effect (meaning this test  can be used) for the duration of the COVID-19 declaration under Section 564(b)(1) of the Act, 21 U.S.C. section  360bbb-3(b)(1), unless the authorization is terminated or revoked sooner. Performed at [facility], [address], [place], [place] [address]   MRSA PCR Screening     Status: None   Collection Time: 11/24/18  6:21 PM  Result Value Ref Range Status   MRSA by PCR NEGATIVE NEGATIVE Final    Comment:        The GeneXpert MRSA Assay (FDA approved for NASAL specimens only), is one component of a comprehensive MRSA colonization surveillance program. It is not intended to diagnose MRSA infection nor to guide or monitor treatment for MRSA infections. Performed at [facility], [address] [place], [place] [address]    Studies/Results: Ct Head Wo Contrast  Result Date: 11/25/2018 CLINICAL DATA:  Altered mental status.  Recent falls EXAM: CT HEAD WITHOUT CONTRAST TECHNIQUE: Contiguous axial images were obtained from the base of the skull through the vertex without intravenous contrast. COMPARISON:  None. FINDINGS: Brain: There is mild diffuse atrophy. There is no intracranial mass, hemorrhage, extra-axial fluid collection, or midline shift. There is patchy small vessel disease in the centra semiovale bilaterally. No acute appearing infarct is demonstrable on this study. Vascular: There is no appreciable hyperdense vessel. There is calcification in the right carotid siphon region as well as in the distal right vertebral artery. Skull: The bony calvarium appears intact. Sinuses/Orbits: Visualized paranasal sinuses are clear. Orbits appear symmetric bilaterally. Other: Mastoid air cells clear. IMPRESSION: Mild atrophy with patchy periventricular small vessel disease. No evident acute infarct. No mass or hemorrhage. Foci of arterial vascular calcification noted. Electronically Signed   By: Lowella Grip III M.D.   On: 11/25/2018 10:56    Medications:  Prior to Admission:  Medications Prior to Admission  Medication Sig Dispense Refill Last Dose  . alendronate (FOSAMAX) 70 MG tablet Take 70 mg  by mouth once a week. Take with a full glass of water on an empty stomach.   Past Week at Unknown time  . ALPRAZolam (XANAX) 1 MG tablet Take 1 mg by mouth at bedtime.   11/23/2018 at Unknown time  . Biotin w/ Vitamins C & E (HAIR/SKIN/NAILS PO) Take 1 tablet by mouth daily.   11/23/2018 at Unknown time  . calcium-vitamin D (OS-CAL 500 + D) 500-200 MG-UNIT per tablet Take 1 tablet by mouth 2 (two) times daily.    11/23/2018 at Unknown time  . HYDROcodone-acetaminophen (NORCO) 10-325 MG tablet 1 tablet 2 (two) times daily as needed.   Past Week at Unknown time  . loperamide (IMODIUM) 2 MG capsule Take by mouth as needed for diarrhea or loose stools.   Not Taking  . Multiple Vitamin (MULTIVITAMIN WITH MINERALS) TABS Take 1 tablet by mouth daily.   11/23/2018 at Unknown time  . omeprazole (PRILOSEC) 20 MG capsule Take 20 mg by mouth every morning.    11/23/2018 at Unknown time  . Polyethyl Glycol-Propyl Glycol (SYSTANE OP) Apply 1 drop to eye daily as needed. Dry Eyes   11/23/2018 at Unknown time  . sertraline (ZOLOFT) 50 MG tablet Take 50 mg by mouth every morning.    11/23/2018 at Unknown time  . diphenoxylate-atropine (LOMOTIL) 2.5-0.025 MG tablet Take by mouth 4 (four) times daily as needed for diarrhea or loose stools.   Not Taking   Scheduled: . ALPRAZolam  0.5 mg Oral QHS  . apixaban  2.5 mg Oral BID  . Chlorhexidine  Gluconate Cloth  6 each Topical Daily  . diltiazem  240 mg Oral Daily  . off the beat book   Does not apply Once  . pantoprazole  40 mg Oral Daily  . sertraline  50 mg Oral q morning - 10a  . sodium chloride flush  3 mL Intravenous Q12H   Continuous: . sodium chloride     VIF:XGXIVH chloride, acetaminophen **OR** acetaminophen, HYDROcodone-acetaminophen, sodium chloride flush  Assesment: She was admitted with atrial fibrillation with rapid ventricular response.  She has responded well to diltiazem and she was started on Eliquis.  No new complaints.  She feels well and ready for  discharge. Active Problems:   Atrial fibrillation with RVR (HCC)   Chronic back pain   Anxiety and depression   Atrial fibrillation with rapid ventricular response ([facility])    Plan: Discharge home today    LOS: 2 days   Kristy Mckinney 11/27/2018, 7:47 AM

## 2018-11-28 ENCOUNTER — Emergency Department (HOSPITAL_COMMUNITY): Payer: PPO

## 2018-11-28 ENCOUNTER — Encounter (HOSPITAL_COMMUNITY): Payer: Self-pay | Admitting: Emergency Medicine

## 2018-11-28 ENCOUNTER — Other Ambulatory Visit: Payer: Self-pay

## 2018-11-28 ENCOUNTER — Inpatient Hospital Stay (HOSPITAL_COMMUNITY)
Admission: EM | Admit: 2018-11-28 | Discharge: 2018-12-05 | DRG: 480 | Disposition: A | Discharge status: Skilled Nursing Facility | Payer: PPO | Attending: Internal Medicine | Admitting: Internal Medicine

## 2018-11-28 DIAGNOSIS — I4891 Unspecified atrial fibrillation: Secondary | ICD-10-CM | POA: Diagnosis not present

## 2018-11-28 DIAGNOSIS — S7291XA Unspecified fracture of right femur, initial encounter for closed fracture: Secondary | ICD-10-CM | POA: Diagnosis not present

## 2018-11-28 DIAGNOSIS — R296 Repeated falls: Secondary | ICD-10-CM | POA: Diagnosis not present

## 2018-11-28 DIAGNOSIS — W010XXA Fall on same level from slipping, tripping and stumbling without subsequent striking against object, initial encounter: Secondary | ICD-10-CM | POA: Diagnosis present

## 2018-11-28 DIAGNOSIS — K219 Gastro-esophageal reflux disease without esophagitis: Secondary | ICD-10-CM | POA: Diagnosis present

## 2018-11-28 DIAGNOSIS — F419 Anxiety disorder, unspecified: Secondary | ICD-10-CM | POA: Diagnosis not present

## 2018-11-28 DIAGNOSIS — F0390 Unspecified dementia without behavioral disturbance: Secondary | ICD-10-CM | POA: Diagnosis present

## 2018-11-28 DIAGNOSIS — Z880 Allergy status to penicillin: Secondary | ICD-10-CM | POA: Diagnosis not present

## 2018-11-28 DIAGNOSIS — Z1159 Encounter for screening for other viral diseases: Secondary | ICD-10-CM

## 2018-11-28 DIAGNOSIS — S72141A Displaced intertrochanteric fracture of right femur, initial encounter for closed fracture: Secondary | ICD-10-CM | POA: Diagnosis not present

## 2018-11-28 DIAGNOSIS — F329 Major depressive disorder, single episode, unspecified: Secondary | ICD-10-CM | POA: Diagnosis present

## 2018-11-28 DIAGNOSIS — G8929 Other chronic pain: Secondary | ICD-10-CM | POA: Diagnosis present

## 2018-11-28 DIAGNOSIS — R000 Tachycardia, unspecified: Secondary | ICD-10-CM | POA: Diagnosis not present

## 2018-11-28 DIAGNOSIS — R5381 Other malaise: Secondary | ICD-10-CM | POA: Diagnosis not present

## 2018-11-28 DIAGNOSIS — R41841 Cognitive communication deficit: Secondary | ICD-10-CM | POA: Diagnosis not present

## 2018-11-28 DIAGNOSIS — M25551 Pain in right hip: Secondary | ICD-10-CM | POA: Diagnosis not present

## 2018-11-28 DIAGNOSIS — W19XXXA Unspecified fall, initial encounter: Secondary | ICD-10-CM | POA: Diagnosis not present

## 2018-11-28 DIAGNOSIS — Z20828 Contact with and (suspected) exposure to other viral communicable diseases: Secondary | ICD-10-CM | POA: Diagnosis not present

## 2018-11-28 DIAGNOSIS — R0902 Hypoxemia: Secondary | ICD-10-CM | POA: Diagnosis not present

## 2018-11-28 DIAGNOSIS — I5031 Acute diastolic (congestive) heart failure: Secondary | ICD-10-CM | POA: Diagnosis present

## 2018-11-28 DIAGNOSIS — M549 Dorsalgia, unspecified: Secondary | ICD-10-CM | POA: Diagnosis present

## 2018-11-28 DIAGNOSIS — Z9181 History of falling: Secondary | ICD-10-CM | POA: Diagnosis not present

## 2018-11-28 DIAGNOSIS — Z886 Allergy status to analgesic agent status: Secondary | ICD-10-CM | POA: Diagnosis not present

## 2018-11-28 DIAGNOSIS — Z7901 Long term (current) use of anticoagulants: Secondary | ICD-10-CM | POA: Diagnosis not present

## 2018-11-28 DIAGNOSIS — M545 Low back pain: Secondary | ICD-10-CM | POA: Diagnosis not present

## 2018-11-28 DIAGNOSIS — Z79899 Other long term (current) drug therapy: Secondary | ICD-10-CM | POA: Diagnosis not present

## 2018-11-28 DIAGNOSIS — F411 Generalized anxiety disorder: Secondary | ICD-10-CM | POA: Diagnosis not present

## 2018-11-28 DIAGNOSIS — Z90710 Acquired absence of both cervix and uterus: Secondary | ICD-10-CM

## 2018-11-28 DIAGNOSIS — R400 Somnolence: Secondary | ICD-10-CM

## 2018-11-28 DIAGNOSIS — S199XXA Unspecified injury of neck, initial encounter: Secondary | ICD-10-CM | POA: Diagnosis not present

## 2018-11-28 DIAGNOSIS — S72144A Nondisplaced intertrochanteric fracture of right femur, initial encounter for closed fracture: Principal | ICD-10-CM | POA: Diagnosis present

## 2018-11-28 DIAGNOSIS — M6281 Muscle weakness (generalized): Secondary | ICD-10-CM | POA: Diagnosis not present

## 2018-11-28 DIAGNOSIS — F418 Other specified anxiety disorders: Secondary | ICD-10-CM | POA: Diagnosis not present

## 2018-11-28 DIAGNOSIS — F05 Delirium due to known physiological condition: Secondary | ICD-10-CM | POA: Diagnosis not present

## 2018-11-28 DIAGNOSIS — S72144D Nondisplaced intertrochanteric fracture of right femur, subsequent encounter for closed fracture with routine healing: Secondary | ICD-10-CM | POA: Diagnosis not present

## 2018-11-28 DIAGNOSIS — Z882 Allergy status to sulfonamides status: Secondary | ICD-10-CM

## 2018-11-28 DIAGNOSIS — R52 Pain, unspecified: Secondary | ICD-10-CM | POA: Diagnosis not present

## 2018-11-28 DIAGNOSIS — S72143D Displaced intertrochanteric fracture of unspecified femur, subsequent encounter for closed fracture with routine healing: Secondary | ICD-10-CM | POA: Diagnosis not present

## 2018-11-28 DIAGNOSIS — K5900 Constipation, unspecified: Secondary | ICD-10-CM | POA: Diagnosis not present

## 2018-11-28 DIAGNOSIS — S79911A Unspecified injury of right hip, initial encounter: Secondary | ICD-10-CM | POA: Diagnosis not present

## 2018-11-28 DIAGNOSIS — R0602 Shortness of breath: Secondary | ICD-10-CM

## 2018-11-28 DIAGNOSIS — F32A Depression, unspecified: Secondary | ICD-10-CM | POA: Diagnosis present

## 2018-11-28 DIAGNOSIS — Z741 Need for assistance with personal care: Secondary | ICD-10-CM | POA: Diagnosis not present

## 2018-11-28 DIAGNOSIS — J90 Pleural effusion, not elsewhere classified: Secondary | ICD-10-CM | POA: Diagnosis not present

## 2018-11-28 DIAGNOSIS — M2550 Pain in unspecified joint: Secondary | ICD-10-CM | POA: Diagnosis not present

## 2018-11-28 DIAGNOSIS — I517 Cardiomegaly: Secondary | ICD-10-CM | POA: Diagnosis not present

## 2018-11-28 DIAGNOSIS — F0391 Unspecified dementia with behavioral disturbance: Secondary | ICD-10-CM | POA: Diagnosis not present

## 2018-11-28 DIAGNOSIS — R262 Difficulty in walking, not elsewhere classified: Secondary | ICD-10-CM | POA: Diagnosis not present

## 2018-11-28 DIAGNOSIS — Z7401 Bed confinement status: Secondary | ICD-10-CM | POA: Diagnosis not present

## 2018-11-28 DIAGNOSIS — I959 Hypotension, unspecified: Secondary | ICD-10-CM | POA: Diagnosis not present

## 2018-11-28 DIAGNOSIS — S72001A Fracture of unspecified part of neck of right femur, initial encounter for closed fracture: Secondary | ICD-10-CM | POA: Diagnosis not present

## 2018-11-28 DIAGNOSIS — R410 Disorientation, unspecified: Secondary | ICD-10-CM | POA: Diagnosis not present

## 2018-11-28 DIAGNOSIS — Y92009 Unspecified place in unspecified non-institutional (private) residence as the place of occurrence of the external cause: Secondary | ICD-10-CM

## 2018-11-28 DIAGNOSIS — S0990XA Unspecified injury of head, initial encounter: Secondary | ICD-10-CM | POA: Diagnosis not present

## 2018-11-28 DIAGNOSIS — Z419 Encounter for procedure for purposes other than remedying health state, unspecified: Secondary | ICD-10-CM

## 2018-11-28 DIAGNOSIS — Z4789 Encounter for other orthopedic aftercare: Secondary | ICD-10-CM | POA: Diagnosis not present

## 2018-11-28 LAB — CBC WITH DIFFERENTIAL/PLATELET
Abs Immature Granulocytes: 0.05 10*3/uL (ref 0.00–0.07)
Basophils Absolute: 0.1 10*3/uL (ref 0.0–0.1)
Basophils Relative: 1 %
Eosinophils Absolute: 0.1 10*3/uL (ref 0.0–0.5)
Eosinophils Relative: 1 %
HCT: 38.8 % (ref 36.0–46.0)
Hemoglobin: 11.8 g/dL — ABNORMAL LOW (ref 12.0–15.0)
Immature Granulocytes: 1 %
Lymphocytes Relative: 12 %
Lymphs Abs: 0.9 10*3/uL (ref 0.7–4.0)
MCH: 26.1 pg (ref 26.0–34.0)
MCHC: 30.4 g/dL (ref 30.0–36.0)
MCV: 85.8 fL (ref 80.0–100.0)
Monocytes Absolute: 0.5 10*3/uL (ref 0.1–1.0)
Monocytes Relative: 6 %
Neutro Abs: 6.5 10*3/uL (ref 1.7–7.7)
Neutrophils Relative %: 79 %
Platelets: 226 10*3/uL (ref 150–400)
RBC: 4.52 MIL/uL (ref 3.87–5.11)
RDW: 16.2 % — ABNORMAL HIGH (ref 11.5–15.5)
WBC: 8.1 10*3/uL (ref 4.0–10.5)
nRBC: 0 % (ref 0.0–0.2)

## 2018-11-28 LAB — SURGICAL PCR SCREEN
MRSA, PCR: NEGATIVE
Staphylococcus aureus: NEGATIVE

## 2018-11-28 LAB — BASIC METABOLIC PANEL
Anion gap: 11 (ref 5–15)
BUN: 16 mg/dL (ref 8–23)
CO2: 26 mmol/L (ref 22–32)
Calcium: 9.2 mg/dL (ref 8.9–10.3)
Chloride: 101 mmol/L (ref 98–111)
Creatinine, Ser: 0.68 mg/dL (ref 0.44–1.00)
GFR calc Af Amer: >60 mL/min (ref >60)
GFR calc non Af Amer: >60 mL/min (ref >60)
Glucose, Bld: 115 mg/dL — ABNORMAL HIGH (ref 70–99)
Potassium: 3.8 mmol/L (ref 3.5–5.1)
Sodium: 138 mmol/L (ref 135–145)

## 2018-11-28 LAB — APTT
aPTT: 38 seconds — ABNORMAL HIGH (ref 24–36)
aPTT: >200 seconds (ref 24–36)
aPTT: 192 seconds (ref 24–36)

## 2018-11-28 LAB — HEPARIN LEVEL (UNFRACTIONATED): Heparin Unfractionated: 1.86 IU/mL — ABNORMAL HIGH (ref 0.30–0.70)

## 2018-11-28 LAB — SARS CORONAVIRUS 2 BY RT PCR (HOSPITAL ORDER, PERFORMED IN ~~LOC~~ HOSPITAL LAB): SARS Coronavirus 2: NEGATIVE

## 2018-11-28 MED ORDER — HEPARIN (PORCINE) 25000 UT/250ML-% IV SOLN
700.0000 [IU]/h | INTRAVENOUS | Status: DC
  Administered 2018-11-28 – 2018-11-29 (×2): 700 [IU]/h via INTRAVENOUS
  Filled 2018-11-28: qty 250

## 2018-11-28 MED ORDER — DILTIAZEM HCL 100 MG IV SOLR
5.0000 mg/h | INTRAVENOUS | Status: DC
  Administered 2018-11-28: 5 mg/h via INTRAVENOUS
  Filled 2018-11-28 (×5): qty 100

## 2018-11-28 MED ORDER — DILTIAZEM LOAD VIA INFUSION
10.0000 mg | Freq: Once | INTRAVENOUS | Status: AC
  Administered 2018-11-28: 10 mg via INTRAVENOUS
  Filled 2018-11-28: qty 10

## 2018-11-28 MED ORDER — FUROSEMIDE 10 MG/ML IJ SOLN
40.0000 mg | Freq: Once | INTRAMUSCULAR | Status: AC
  Administered 2018-11-28: 40 mg via INTRAVENOUS
  Filled 2018-11-28: qty 4

## 2018-11-28 MED ORDER — MORPHINE SULFATE (PF) 2 MG/ML IV SOLN: 0.5000 mg | INTRAVENOUS | Status: DC | PRN

## 2018-11-28 MED ORDER — SERTRALINE HCL 50 MG PO TABS
50.0000 mg | ORAL_TABLET | Freq: Every morning | ORAL | Status: DC
  Administered 2018-11-29 – 2018-12-05 (×6): 50 mg via ORAL
  Filled 2018-11-28 (×7): qty 1

## 2018-11-28 MED ORDER — HYDROCODONE-ACETAMINOPHEN 5-325 MG PO TABS: 1.0000 | ORAL_TABLET | Freq: Four times a day (QID) | ORAL | Status: DC | PRN

## 2018-11-28 MED ORDER — NALOXONE HCL 0.4 MG/ML IJ SOLN
0.4000 mg | Freq: Once | INTRAMUSCULAR | Status: AC
  Administered 2018-11-28: 0.4 mg via INTRAVENOUS
  Filled 2018-11-28: qty 1

## 2018-11-28 MED ORDER — HEPARIN BOLUS VIA INFUSION: 3300.0000 [IU] | Freq: Once | INTRAVENOUS | Status: DC

## 2018-11-28 MED ORDER — POVIDONE-IODINE 10 % EX SWAB: 2.0000 "application " | Freq: Once | CUTANEOUS | Status: DC

## 2018-11-28 MED ORDER — CALCIUM CARBONATE-VITAMIN D 500-200 MG-UNIT PO TABS
1.0000 | ORAL_TABLET | Freq: Two times a day (BID) | ORAL | Status: DC
  Administered 2018-11-29 – 2018-12-05 (×9): 1 via ORAL
  Filled 2018-11-28 (×10): qty 1

## 2018-11-28 MED ORDER — ACETAMINOPHEN 325 MG PO TABS
650.0000 mg | ORAL_TABLET | Freq: Four times a day (QID) | ORAL | Status: DC | PRN
  Administered 2018-11-29: 650 mg via ORAL
  Filled 2018-11-28: qty 2

## 2018-11-28 MED ORDER — CHLORHEXIDINE GLUCONATE 4 % EX LIQD
60.0000 mL | Freq: Once | CUTANEOUS | Status: AC
  Administered 2018-11-29: 4 via TOPICAL
  Filled 2018-11-28: qty 60

## 2018-11-28 MED ORDER — DILTIAZEM HCL-DEXTROSE 100-5 MG/100ML-% IV SOLN (PREMIX)
5.0000 mg/h | INTRAVENOUS | Status: DC
  Administered 2018-11-28: 10 mg/h via INTRAVENOUS
  Administered 2018-11-28 – 2018-11-29 (×2): 12.5 mg/h via INTRAVENOUS
  Filled 2018-11-28 (×4): qty 100

## 2018-11-28 MED ORDER — CLINDAMYCIN PHOSPHATE 900 MG/50ML IV SOLN
900.0000 mg | INTRAVENOUS | Status: AC
  Administered 2018-11-29: 13:00:00 900 mg via INTRAVENOUS
  Filled 2018-11-28: qty 50

## 2018-11-28 MED ORDER — ADULT MULTIVITAMIN W/MINERALS CH: 1.0000 | ORAL_TABLET | Freq: Every day | ORAL | Status: DC

## 2018-11-28 MED ORDER — HEPARIN (PORCINE) 25000 UT/250ML-% IV SOLN
950.0000 [IU]/h | INTRAVENOUS | Status: DC
  Administered 2018-11-28: 950 [IU]/h via INTRAVENOUS
  Filled 2018-11-28: qty 250

## 2018-11-28 MED ORDER — ENSURE ENLIVE PO LIQD
237.0000 mL | Freq: Two times a day (BID) | ORAL | Status: DC
  Administered 2018-12-02: 237 mL via ORAL

## 2018-11-28 MED ORDER — ONDANSETRON HCL 4 MG/2ML IJ SOLN
4.0000 mg | Freq: Four times a day (QID) | INTRAMUSCULAR | Status: DC | PRN
  Administered 2018-11-29: 14:00:00 4 mg via INTRAVENOUS

## 2018-11-28 MED ORDER — PANTOPRAZOLE SODIUM 40 MG PO TBEC
40.0000 mg | DELAYED_RELEASE_TABLET | Freq: Every day | ORAL | Status: DC
  Administered 2018-11-29 – 2018-12-05 (×6): 40 mg via ORAL
  Filled 2018-11-28 (×7): qty 1

## 2018-11-28 MED ORDER — HEPARIN (PORCINE) 25000 UT/250ML-% IV SOLN: 950.0000 [IU]/h | INTRAVENOUS | Status: DC

## 2018-11-28 MED ORDER — HYDROMORPHONE HCL 1 MG/ML IJ SOLN
0.5000 mg | Freq: Once | INTRAMUSCULAR | Status: AC
  Administered 2018-11-28: 0.5 mg via INTRAVENOUS
  Filled 2018-11-28: qty 1

## 2018-11-28 MED ORDER — DILTIAZEM HCL-DEXTROSE 100-5 MG/100ML-% IV SOLN (PREMIX): 5.0000 mg/h | INTRAVENOUS | Status: DC

## 2018-11-28 MED ORDER — ADULT MULTIVITAMIN W/MINERALS CH
1.0000 | ORAL_TABLET | Freq: Every day | ORAL | Status: DC
  Administered 2018-11-29 – 2018-12-05 (×4): 1 via ORAL
  Filled 2018-11-28 (×6): qty 1

## 2018-11-28 MED ORDER — HEPARIN BOLUS VIA INFUSION
3300.0000 [IU] | Freq: Once | INTRAVENOUS | Status: AC
  Administered 2018-11-28: 3300 [IU] via INTRAVENOUS

## 2018-11-28 MED ORDER — ONDANSETRON HCL 4 MG/2ML IJ SOLN
4.0000 mg | Freq: Once | INTRAMUSCULAR | Status: AC
  Administered 2018-11-28: 06:00:00 4 mg via INTRAVENOUS
  Filled 2018-11-28: qty 2

## 2018-11-28 NOTE — ED Provider Notes (Signed)
Blood pressure (!) 148/87, pulse (!) 118, temperature 97.7 F (36.5 C), temperature source Oral, resp. rate (!) 24, height 5\' 5"  (1.651 m), weight 66.7 kg, SpO2 93 %.  Assuming care from Dr. Roderic Palau.  In short, Kristy Mckinney is a 85 y.o. female with a chief complaint of Fall .  Refer to the original H&P for additional details.  The current plan of care is to f/u on CT hip.  07:05 AM  CT shows right intertrochanteric hip fracture. Patient also with pulmonary edema pattern on CXR. Apparently with mild hypoxemia here after pain medication. Will give lasix IV and discuss with ortho on call. Evaluated patient at bedside. She is somnolent but arouses to voice. HR in the 120s-130s. Will start diltiazem infusion in attempt to keep NPO pending possible surgery. Patient is on Diltiazem tabs at home and has not had AM dose. COVID negative.   07:40 AM  Spoke with Dr. Aline Brochure. He is requesting sending patient to [facility] due to medical complexity and lack of Cardiology coverage over the weekend here at [facility].   08:25 AM  Re-evaluated. Patient remains very somnolent. HR improved on Cardizem. Will dive low dose Narcan and reassess.   08:35 AM  Improved alertness after 0.4 mg Narcan. Will hold here. Patient is on chronic narcotics. Maintaining airway. Continue to monitor.   09:22 AM  Spoke with Dr. Percell Miller. Ok with transfer to [facility]. NPO after midnight. Will discuss with the Hospitalist.   Discussed patient's case with Hospitalist, Dr. Carles Collet to request admission. Patient and family (if present) updated with plan. Care transferred to Hospitalist service.  I reviewed all nursing notes, vitals, pertinent old records, EKGs, labs, imaging (as available).   CRITICAL CARE Performed by: Margette Fast Total critical care time: 35 minutes Critical care time was exclusive of separately billable procedures and treating other patients. Critical care was necessary to treat or prevent imminent or life-threatening  deterioration. Critical care was time spent personally by me on the following activities: development of treatment plan with patient and/or surrogate as well as nursing, discussions with consultants, evaluation of patient's response to treatment, examination of patient, obtaining history from patient or surrogate, ordering and performing treatments and interventions, ordering and review of laboratory studies, ordering and review of radiographic studies, pulse oximetry and re-evaluation of patient's condition.  Nanda Quinton, MD Emergency Medicine     Ylonda Storr, [facility], MD 11/28/18 [phone]

## 2018-11-28 NOTE — ED Notes (Signed)
Report given to [place], [facility] RN.

## 2018-11-28 NOTE — Progress Notes (Signed)
Pt transferred from [facility] to [place]. Pt is on Diltiazem drip, requires a cardiac/tele floor. Pt transferred with cardiac monitor and oxygen. Pt not in distress and tolerated well. Pt's family made aware of transfer.

## 2018-11-28 NOTE — H&P (View-Only) (Signed)
Reason for Consult:Right hip fx Referring Physician: D Tat  Kristy Mckinney is an 85 y.o. female.  HPI: Kristy Mckinney fell at home after she tripped on a rug going to the bathroom. She had immediate pain and could not get up. She was brought to [facility] where x-rays and a CT showed a left intertroch hip fx. She was transferred to [facility] for definitive care and orthopedic surgery was consulted. She is quite somnolent during my visit and so couldn't really contribute to history and would barely participate with examination.  Past Medical History:  Diagnosis Date  . Chronic back pain   . Diarrhea   . Dysphagia 05/03/2016  . GERD (gastroesophageal reflux disease)     Past Surgical History:  Procedure Laterality Date  . ABDOMINAL HYSTERECTOMY    . BACK SURGERY    . CHOLECYSTECTOMY N/A 01/29/2014   Procedure: LAPAROSCOPIC CHOLECYSTECTOMY;  Surgeon: Scherry Ran, MD;  Location: [facility] ORS;  Service: General;  Laterality: N/A;  . COLONOSCOPY N/A 10/27/2015   Procedure: COLONOSCOPY;  Surgeon: Rogene Houston, MD;  Location: [facility] ENDO SUITE;  Service: Endoscopy;  Laterality: N/A;  215  . TONSILLECTOMY    . YAG LASER APPLICATION Left 25/11/3974   Procedure: YAG LASER APPLICATION;  Surgeon: Elta Guadeloupe T. Gershon Crane, MD;  Location: [facility] ORS;  Service: Ophthalmology;  Laterality: Left;  left    No family history on file.  Social History:  reports that she has never smoked. She has never used smokeless tobacco. She reports that she does not drink alcohol or use drugs.  Allergies:  Allergies  Allergen Reactions  . Penicillins Anaphylaxis  . Feldene [Piroxicam] Nausea And Vomiting  . Sulfa Antibiotics     swelling    Medications: I have reviewed the patient's current medications.  Results for orders placed or performed during the hospital encounter of 11/28/18 (from the past 48 hour(s))  SARS Coronavirus 2 (CEPHEID - Performed in [facility] hospital lab), Hosp Order     Status: None   Collection Time: 11/28/18   6:04 AM  Result Value Ref Range   SARS Coronavirus 2 NEGATIVE NEGATIVE    Comment: (NOTE) If result is NEGATIVE SARS-CoV-2 target nucleic acids are NOT DETECTED. The SARS-CoV-2 RNA is generally detectable in upper and lower  respiratory specimens during the acute phase of infection. The lowest  concentration of SARS-CoV-2 viral copies this assay can detect is 250  copies / mL. A negative result does not preclude SARS-CoV-2 infection  and should not be used as the sole basis for treatment or other  patient management decisions.  A negative result may occur with  improper specimen collection / handling, submission of specimen other  than nasopharyngeal swab, presence of viral mutation(s) within the  areas targeted by this assay, and inadequate number of viral copies  (<250 copies / mL). A negative result must be combined with clinical  observations, patient history, and epidemiological information. If result is POSITIVE SARS-CoV-2 target nucleic acids are DETECTED. The SARS-CoV-2 RNA is generally detectable in upper and lower  respiratory specimens dur ing the acute phase of infection.  Positive  results are indicative of active infection with SARS-CoV-2.  Clinical  correlation with patient history and other diagnostic information is  necessary to determine patient infection status.  Positive results do  not rule out bacterial infection or co-infection with other viruses. If result is PRESUMPTIVE POSTIVE SARS-CoV-2 nucleic acids MAY BE PRESENT.   A presumptive positive result was obtained on the submitted specimen  and confirmed on repeat testing.  While 2019 novel coronavirus  (SARS-CoV-2) nucleic acids may be present in the submitted sample  additional confirmatory testing may be necessary for epidemiological  and / or clinical management purposes  to differentiate between  SARS-CoV-2 and other Sarbecovirus currently known to infect humans.  If clinically indicated additional  testing with an alternate test  methodology [phone]) is advised. The SARS-CoV-2 RNA is generally  detectable in upper and lower respiratory sp ecimens during the acute  phase of infection. The expected result is Negative. Fact Sheet for Patients:  StrictlyIdeas.no Fact Sheet for Healthcare Providers: BankingDealers.co.za This test is not yet approved or cleared by the [place] FDA and has been authorized for detection and/or diagnosis of SARS-CoV-2 by FDA under an Emergency Use Authorization (EUA).  This EUA will remain in effect (meaning this test can be used) for the duration of the COVID-19 declaration under Section 564(b)(1) of the Act, 21 U.S.C. section 360bbb-3(b)(1), unless the authorization is terminated or revoked sooner. Performed at [facility], [address], [place], [place] [address]   CBC with Differential/Platelet     Status: Abnormal   Collection Time: 11/28/18  6:07 AM  Result Value Ref Range   WBC 8.1 4.0 - 10.5 K/uL   RBC 4.52 3.87 - 5.11 MIL/uL   Hemoglobin 11.8 (L) 12.0 - 15.0 g/dL   HCT 38.8 36.0 - 46.0 %   MCV 85.8 80.0 - 100.0 fL   MCH 26.1 26.0 - 34.0 pg   MCHC 30.4 30.0 - 36.0 g/dL   RDW 16.2 (H) 11.5 - 15.5 %   Platelets 226 150 - 400 K/uL   nRBC 0.0 0.0 - 0.2 %   Neutrophils Relative % 79 %   Neutro Abs 6.5 1.7 - 7.7 K/uL   Lymphocytes Relative 12 %   Lymphs Abs 0.9 0.7 - 4.0 K/uL   Monocytes Relative 6 %   Monocytes Absolute 0.5 0.1 - 1.0 K/uL   Eosinophils Relative 1 %   Eosinophils Absolute 0.1 0.0 - 0.5 K/uL   Basophils Relative 1 %   Basophils Absolute 0.1 0.0 - 0.1 K/uL   Immature Granulocytes 1 %   Abs Immature Granulocytes 0.05 0.00 - 0.07 K/uL    Comment: Performed at [facility], [address], [place], [place] [address]  Basic metabolic panel     Status: Abnormal   Collection Time: 11/28/18  6:07 AM  Result Value Ref Range   Sodium 138 135 - 145 mmol/L   Potassium 3.8 3.5 -  5.1 mmol/L   Chloride 101 98 - 111 mmol/L   CO2 26 22 - 32 mmol/L   Glucose, Bld 115 (H) 70 - 99 mg/dL   BUN 16 8 - 23 mg/dL   Creatinine, Ser 0.68 0.44 - 1.00 mg/dL   Calcium 9.2 8.9 - 10.3 mg/dL   GFR calc non Af Amer >60 >60 mL/min   GFR calc Af Amer >60 >60 mL/min   Anion gap 11 5 - 15    Comment: Performed at [facility], [address], [place], [place] [address]  Heparin level (unfractionated)     Status: Abnormal   Collection Time: 11/28/18 10:55 AM  Result Value Ref Range   Heparin Unfractionated 1.86 (H) 0.30 - 0.70 IU/mL    Comment: RESULTS CONFIRMED BY MANUAL DILUTION (NOTE) If heparin results are below expected values, and patient dosage has  been confirmed, suggest follow up testing of antithrombin III levels. Performed at [facility],  [address] [place], [place] [address]   APTT     Status: Abnormal   Collection Time: 11/28/18 10:55 AM  Result Value Ref Range   aPTT 38 (H) 24 - 36 seconds    Comment:        IF BASELINE aPTT IS ELEVATED, SUGGEST PATIENT RISK ASSESSMENT BE USED TO DETERMINE APPROPRIATE ANTICOAGULANT THERAPY. Performed at [facility], [address] [place], [place] [address]     Dg Chest 1 View  Result Date: 11/28/2018 CLINICAL DATA:  Hip fracture EXAM: CHEST  1 VIEW COMPARISON:  11/24/2018 FINDINGS: Cardiomegaly. Mitral annular calcification. Small pleural effusions and diffuse interstitial opacity. No pneumothorax. No appreciable fracture. IMPRESSION: CHF pattern. Electronically Signed   By: Monte Fantasia M.D.   On: 11/28/2018 07:00   Ct Head Wo Contrast  Result Date: 11/28/2018 CLINICAL DATA:  Multiple falls EXAM: CT HEAD WITHOUT CONTRAST CT CERVICAL SPINE WITHOUT CONTRAST TECHNIQUE: Multidetector CT imaging of the head and cervical spine was performed following the standard protocol without intravenous contrast. Multiplanar CT image reconstructions of the cervical spine were also generated. COMPARISON:  11/25/2018 head CT FINDINGS: CT  HEAD FINDINGS Brain: No evidence of acute infarction, hemorrhage, hydrocephalus, extra-axial collection or mass lesion/mass effect. Mild chronic small vessel ischemia and volume loss for age. Vascular: Atherosclerosis Skull: Negative for fracture Sinuses/Orbits: No evidence of injury.  Bilateral cataract resection CT CERVICAL SPINE FINDINGS Alignment: Normal Skull base and vertebrae: Negative for acute fracture. Soft tissues and spinal canal: No prevertebral fluid or swelling. No visible canal hematoma. Disc levels: Calcified disc herniation at C5-6 with size implying flattening of the cord. Disc narrowing and uncovertebral spurring causes foraminal stenosis at C4-5 to C6-7. Upper chest: A right pleural effusion is minimally covered. This was also seen on recent chest x-ray 11/24/2018 IMPRESSION: 1. No evidence of intracranial injury or cervical spine fracture. 2. Cervical spine degeneration with notable calcified disc herniation at C5-6 that impinges on the cord. Electronically Signed   By: Monte Fantasia M.D.   On: 11/28/2018 06:53   Ct Cervical Spine Wo Contrast  Result Date: 11/28/2018 CLINICAL DATA:  Multiple falls EXAM: CT HEAD WITHOUT CONTRAST CT CERVICAL SPINE WITHOUT CONTRAST TECHNIQUE: Multidetector CT imaging of the head and cervical spine was performed following the standard protocol without intravenous contrast. Multiplanar CT image reconstructions of the cervical spine were also generated. COMPARISON:  11/25/2018 head CT FINDINGS: CT HEAD FINDINGS Brain: No evidence of acute infarction, hemorrhage, hydrocephalus, extra-axial collection or mass lesion/mass effect. Mild chronic small vessel ischemia and volume loss for age. Vascular: Atherosclerosis Skull: Negative for fracture Sinuses/Orbits: No evidence of injury.  Bilateral cataract resection CT CERVICAL SPINE FINDINGS Alignment: Normal Skull base and vertebrae: Negative for acute fracture. Soft tissues and spinal canal: No prevertebral fluid or  swelling. No visible canal hematoma. Disc levels: Calcified disc herniation at C5-6 with size implying flattening of the cord. Disc narrowing and uncovertebral spurring causes foraminal stenosis at C4-5 to C6-7. Upper chest: A right pleural effusion is minimally covered. This was also seen on recent chest x-ray 11/24/2018 IMPRESSION: 1. No evidence of intracranial injury or cervical spine fracture. 2. Cervical spine degeneration with notable calcified disc herniation at C5-6 that impinges on the cord. Electronically Signed   By: Monte Fantasia M.D.   On: 11/28/2018 06:53   Ct Hip Right Wo Contrast  Result Date: 11/28/2018 CLINICAL DATA:  Fall with right hip pain.  Abnormal radiograph EXAM: CT OF THE RIGHT HIP WITHOUT CONTRAST TECHNIQUE: Multidetector  CT imaging of the right hip was performed according to the standard protocol. Multiplanar CT image reconstructions were also generated. COMPARISON:  Radiography from earlier today FINDINGS: Oblique fracture through the intertrochanteric right femur without displacement. There is mild comminution at the greater trochanter. The hip is located. The visualized right hemipelvis is intact. Regional soft tissue edema and small joint effusion. Pelvic ascites which is trace where visualized and likely from volume overload given chest x-ray. IMPRESSION: Nondisplaced intertrochanteric right femur fracture. Electronically Signed   By: Monte Fantasia M.D.   On: 11/28/2018 06:59   Dg Hip Unilat W Or Wo Pelvis 2-3 Views Right  Result Date: 11/28/2018 CLINICAL DATA:  Recurrent falls.  Right hip pain EXAM: DG HIP (WITH OR WITHOUT PELVIS) 2-3V RIGHT COMPARISON:  12/16/2013 FINDINGS: Unexpected lucency obliquely across the proximal femur at the level of the intertrochanteric segment. This persists on the full pelvis view. No dislocation. IMPRESSION: Probable nondisplaced intertrochanteric femur fracture on the right. Would confirm with CT prior to any surgery. Electronically  Signed   By: Monte Fantasia M.D.   On: 11/28/2018 05:59    Review of Systems  Unable to perform ROS: Other   Blood pressure 137/62, pulse 88, temperature (!) 97.5 F (36.4 C), temperature source Oral, resp. rate 15, height 5\' 5"  (1.651 m), weight 66.7 kg, SpO2 90 %. Physical Exam  Constitutional: She appears well-developed and well-nourished. No distress.  HENT:  Head: Normocephalic and atraumatic.  Eyes: Conjunctivae are normal. Right eye exhibits no discharge. Left eye exhibits no discharge. No scleral icterus.  Neck: Normal range of motion.  Cardiovascular: Normal rate and regular rhythm.  Respiratory: Effort normal. No respiratory distress.  Musculoskeletal:     Comments: RLE No traumatic wounds, ecchymosis, or rash  Nontender  No knee or ankle effusion  Knee stable to varus/ valgus and anterior/posterior stress  Sens DPN, SPN, TN intact  Motor EHL, ext, flex, evers 5/5  DP 1+, PT 1+, No significant edema  Neurological: She is alert.  Skin: Skin is warm and dry. She is not diaphoretic.  Psychiatric: She has a normal mood and affect. Her behavior is normal.    Assessment/Plan: Right hip fx -- Plan IMN by Dr. Percell Miller tomorrow. NPO after MN. Multiple medical problems including atrial fibrillation, anxiety, and GERD -- per primary team    Lisette Abu, PA-C Orthopedic Surgery [phone] 11/28/2018, 1:41 PM

## 2018-11-28 NOTE — Social Work (Signed)
CSW acknowledging consult for SNF placement. Will follow for therapy recommendations.   Alyissa Whidbee, MSW, LCSWA [facility] [phone]   

## 2018-11-28 NOTE — ED Triage Notes (Addendum)
Pt got up without assistance, tripped on rug and fell into dresser. Pt recently discharged from ICU yesterday. Pt had some confusion at baseline.

## 2018-11-28 NOTE — Consult Note (Signed)
Reason for Consult:Right hip fx Referring Physician: D Tat  Kristy Mckinney is an 85 y.o. female.  HPI: Kristy Mckinney fell at home after she tripped on a rug going to the bathroom. She had immediate pain and could not get up. She was brought to [facility] where x-rays and a CT showed a left intertroch hip fx. She was transferred to [facility] for definitive care and orthopedic surgery was consulted. She is quite somnolent during my visit and so couldn't really contribute to history and would barely participate with examination.  Past Medical History:  Diagnosis Date  . Chronic back pain   . Diarrhea   . Dysphagia 05/03/2016  . GERD (gastroesophageal reflux disease)     Past Surgical History:  Procedure Laterality Date  . ABDOMINAL HYSTERECTOMY    . BACK SURGERY    . CHOLECYSTECTOMY N/A 01/29/2014   Procedure: LAPAROSCOPIC CHOLECYSTECTOMY;  Surgeon: Scherry Ran, MD;  Location: [facility] ORS;  Service: General;  Laterality: N/A;  . COLONOSCOPY N/A 10/27/2015   Procedure: COLONOSCOPY;  Surgeon: Rogene Houston, MD;  Location: [facility] ENDO SUITE;  Service: Endoscopy;  Laterality: N/A;  215  . TONSILLECTOMY    . YAG LASER APPLICATION Left 56/43/3295   Procedure: YAG LASER APPLICATION;  Surgeon: Elta Guadeloupe T. Gershon Crane, MD;  Location: [facility] ORS;  Service: Ophthalmology;  Laterality: Left;  left    No family history on file.  Social History:  reports that she has never smoked. She has never used smokeless tobacco. She reports that she does not drink alcohol or use drugs.  Allergies:  Allergies  Allergen Reactions  . Penicillins Anaphylaxis  . Feldene [Piroxicam] Nausea And Vomiting  . Sulfa Antibiotics     swelling    Medications: I have reviewed the patient's current medications.  Results for orders placed or performed during the hospital encounter of 11/28/18 (from the past 48 hour(s))  SARS Coronavirus 2 (CEPHEID - Performed in [facility] hospital lab), Hosp Order     Status: None   Collection Time: 11/28/18   6:04 AM  Result Value Ref Range   SARS Coronavirus 2 NEGATIVE NEGATIVE    Comment: (NOTE) If result is NEGATIVE SARS-CoV-2 target nucleic acids are NOT DETECTED. The SARS-CoV-2 RNA is generally detectable in upper and lower  respiratory specimens during the acute phase of infection. The lowest  concentration of SARS-CoV-2 viral copies this assay can detect is 250  copies / mL. A negative result does not preclude SARS-CoV-2 infection  and should not be used as the sole basis for treatment or other  patient management decisions.  A negative result may occur with  improper specimen collection / handling, submission of specimen other  than nasopharyngeal swab, presence of viral mutation(s) within the  areas targeted by this assay, and inadequate number of viral copies  (<250 copies / mL). A negative result must be combined with clinical  observations, patient history, and epidemiological information. If result is POSITIVE SARS-CoV-2 target nucleic acids are DETECTED. The SARS-CoV-2 RNA is generally detectable in upper and lower  respiratory specimens dur ing the acute phase of infection.  Positive  results are indicative of active infection with SARS-CoV-2.  Clinical  correlation with patient history and other diagnostic information is  necessary to determine patient infection status.  Positive results do  not rule out bacterial infection or co-infection with other viruses. If result is PRESUMPTIVE POSTIVE SARS-CoV-2 nucleic acids MAY BE PRESENT.   A presumptive positive result was obtained on the submitted specimen  and confirmed on repeat testing.  While 2019 novel coronavirus  (SARS-CoV-2) nucleic acids may be present in the submitted sample  additional confirmatory testing may be necessary for epidemiological  and / or clinical management purposes  to differentiate between  SARS-CoV-2 and other Sarbecovirus currently known to infect humans.  If clinically indicated additional  testing with an alternate test  methodology [id number]) is advised. The SARS-CoV-2 RNA is generally  detectable in upper and lower respiratory sp ecimens during the acute  phase of infection. The expected result is Negative. Fact Sheet for Patients:  StrictlyIdeas.no Fact Sheet for Healthcare Providers: BankingDealers.co.za This test is not yet approved or cleared by the [place] FDA and has been authorized for detection and/or diagnosis of SARS-CoV-2 by FDA under an Emergency Use Authorization (EUA).  This EUA will remain in effect (meaning this test can be used) for the duration of the COVID-19 declaration under Section 564(b)(1) of the Act, 21 U.S.C. section 360bbb-3(b)(1), unless the authorization is terminated or revoked sooner. Performed at [facility], [address], [place], [place] [address]   CBC with Differential/Platelet     Status: Abnormal   Collection Time: 11/28/18  6:07 AM  Result Value Ref Range   WBC 8.1 4.0 - 10.5 K/uL   RBC 4.52 3.87 - 5.11 MIL/uL   Hemoglobin 11.8 (L) 12.0 - 15.0 g/dL   HCT 38.8 36.0 - 46.0 %   MCV 85.8 80.0 - 100.0 fL   MCH 26.1 26.0 - 34.0 pg   MCHC 30.4 30.0 - 36.0 g/dL   RDW 16.2 (H) 11.5 - 15.5 %   Platelets 226 150 - 400 K/uL   nRBC 0.0 0.0 - 0.2 %   Neutrophils Relative % 79 %   Neutro Abs 6.5 1.7 - 7.7 K/uL   Lymphocytes Relative 12 %   Lymphs Abs 0.9 0.7 - 4.0 K/uL   Monocytes Relative 6 %   Monocytes Absolute 0.5 0.1 - 1.0 K/uL   Eosinophils Relative 1 %   Eosinophils Absolute 0.1 0.0 - 0.5 K/uL   Basophils Relative 1 %   Basophils Absolute 0.1 0.0 - 0.1 K/uL   Immature Granulocytes 1 %   Abs Immature Granulocytes 0.05 0.00 - 0.07 K/uL    Comment: Performed at [facility], [address], [place], [place] [address]  Basic metabolic panel     Status: Abnormal   Collection Time: 11/28/18  6:07 AM  Result Value Ref Range   Sodium 138 135 - 145 mmol/L   Potassium 3.8 3.5 -  5.1 mmol/L   Chloride 101 98 - 111 mmol/L   CO2 26 22 - 32 mmol/L   Glucose, Bld 115 (H) 70 - 99 mg/dL   BUN 16 8 - 23 mg/dL   Creatinine, Ser 0.68 0.44 - 1.00 mg/dL   Calcium 9.2 8.9 - 10.3 mg/dL   GFR calc non Af Amer >60 >60 mL/min   GFR calc Af Amer >60 >60 mL/min   Anion gap 11 5 - 15    Comment: Performed at [facility], [address] [place], [place] [address]  Heparin level (unfractionated)     Status: Abnormal   Collection Time: 11/28/18 10:55 AM  Result Value Ref Range   Heparin Unfractionated 1.86 (H) 0.30 - 0.70 IU/mL    Comment: RESULTS CONFIRMED BY MANUAL DILUTION (NOTE) If heparin results are below expected values, and patient dosage has  been confirmed, suggest follow up testing of antithrombin III levels. Performed at [facility],  [address] [place], [place] [address]   APTT     Status: Abnormal   Collection Time: 11/28/18 10:55 AM  Result Value Ref Range   aPTT 38 (H) 24 - 36 seconds    Comment:        IF BASELINE aPTT IS ELEVATED, SUGGEST PATIENT RISK ASSESSMENT BE USED TO DETERMINE APPROPRIATE ANTICOAGULANT THERAPY. Performed at [facility], [address] [place], [place] [address]     Dg Chest 1 View  Result Date: 11/28/2018 CLINICAL DATA:  Hip fracture EXAM: CHEST  1 VIEW COMPARISON:  11/24/2018 FINDINGS: Cardiomegaly. Mitral annular calcification. Small pleural effusions and diffuse interstitial opacity. No pneumothorax. No appreciable fracture. IMPRESSION: CHF pattern. Electronically Signed   By: Monte Fantasia M.D.   On: 11/28/2018 07:00   Ct Head Wo Contrast  Result Date: 11/28/2018 CLINICAL DATA:  Multiple falls EXAM: CT HEAD WITHOUT CONTRAST CT CERVICAL SPINE WITHOUT CONTRAST TECHNIQUE: Multidetector CT imaging of the head and cervical spine was performed following the standard protocol without intravenous contrast. Multiplanar CT image reconstructions of the cervical spine were also generated. COMPARISON:  11/25/2018 head CT FINDINGS: CT  HEAD FINDINGS Brain: No evidence of acute infarction, hemorrhage, hydrocephalus, extra-axial collection or mass lesion/mass effect. Mild chronic small vessel ischemia and volume loss for age. Vascular: Atherosclerosis Skull: Negative for fracture Sinuses/Orbits: No evidence of injury.  Bilateral cataract resection CT CERVICAL SPINE FINDINGS Alignment: Normal Skull base and vertebrae: Negative for acute fracture. Soft tissues and spinal canal: No prevertebral fluid or swelling. No visible canal hematoma. Disc levels: Calcified disc herniation at C5-6 with size implying flattening of the cord. Disc narrowing and uncovertebral spurring causes foraminal stenosis at C4-5 to C6-7. Upper chest: A right pleural effusion is minimally covered. This was also seen on recent chest x-ray 11/24/2018 IMPRESSION: 1. No evidence of intracranial injury or cervical spine fracture. 2. Cervical spine degeneration with notable calcified disc herniation at C5-6 that impinges on the cord. Electronically Signed   By: Monte Fantasia M.D.   On: 11/28/2018 06:53   Ct Cervical Spine Wo Contrast  Result Date: 11/28/2018 CLINICAL DATA:  Multiple falls EXAM: CT HEAD WITHOUT CONTRAST CT CERVICAL SPINE WITHOUT CONTRAST TECHNIQUE: Multidetector CT imaging of the head and cervical spine was performed following the standard protocol without intravenous contrast. Multiplanar CT image reconstructions of the cervical spine were also generated. COMPARISON:  11/25/2018 head CT FINDINGS: CT HEAD FINDINGS Brain: No evidence of acute infarction, hemorrhage, hydrocephalus, extra-axial collection or mass lesion/mass effect. Mild chronic small vessel ischemia and volume loss for age. Vascular: Atherosclerosis Skull: Negative for fracture Sinuses/Orbits: No evidence of injury.  Bilateral cataract resection CT CERVICAL SPINE FINDINGS Alignment: Normal Skull base and vertebrae: Negative for acute fracture. Soft tissues and spinal canal: No prevertebral fluid or  swelling. No visible canal hematoma. Disc levels: Calcified disc herniation at C5-6 with size implying flattening of the cord. Disc narrowing and uncovertebral spurring causes foraminal stenosis at C4-5 to C6-7. Upper chest: A right pleural effusion is minimally covered. This was also seen on recent chest x-ray 11/24/2018 IMPRESSION: 1. No evidence of intracranial injury or cervical spine fracture. 2. Cervical spine degeneration with notable calcified disc herniation at C5-6 that impinges on the cord. Electronically Signed   By: Monte Fantasia M.D.   On: 11/28/2018 06:53   Ct Hip Right Wo Contrast  Result Date: 11/28/2018 CLINICAL DATA:  Fall with right hip pain.  Abnormal radiograph EXAM: CT OF THE RIGHT HIP WITHOUT CONTRAST TECHNIQUE: Multidetector  CT imaging of the right hip was performed according to the standard protocol. Multiplanar CT image reconstructions were also generated. COMPARISON:  Radiography from earlier today FINDINGS: Oblique fracture through the intertrochanteric right femur without displacement. There is mild comminution at the greater trochanter. The hip is located. The visualized right hemipelvis is intact. Regional soft tissue edema and small joint effusion. Pelvic ascites which is trace where visualized and likely from volume overload given chest x-ray. IMPRESSION: Nondisplaced intertrochanteric right femur fracture. Electronically Signed   By: Monte Fantasia M.D.   On: 11/28/2018 06:59   Dg Hip Unilat W Or Wo Pelvis 2-3 Views Right  Result Date: 11/28/2018 CLINICAL DATA:  Recurrent falls.  Right hip pain EXAM: DG HIP (WITH OR WITHOUT PELVIS) 2-3V RIGHT COMPARISON:  12/16/2013 FINDINGS: Unexpected lucency obliquely across the proximal femur at the level of the intertrochanteric segment. This persists on the full pelvis view. No dislocation. IMPRESSION: Probable nondisplaced intertrochanteric femur fracture on the right. Would confirm with CT prior to any surgery. Electronically  Signed   By: Monte Fantasia M.D.   On: 11/28/2018 05:59    Review of Systems  Unable to perform ROS: Other   Blood pressure 137/62, pulse 88, temperature (!) 97.5 F (36.4 C), temperature source Oral, resp. rate 15, height 5\' 5"  (1.651 m), weight 66.7 kg, SpO2 90 %. Physical Exam  Constitutional: She appears well-developed and well-nourished. No distress.  HENT:  Head: Normocephalic and atraumatic.  Eyes: Conjunctivae are normal. Right eye exhibits no discharge. Left eye exhibits no discharge. No scleral icterus.  Neck: Normal range of motion.  Cardiovascular: Normal rate and regular rhythm.  Respiratory: Effort normal. No respiratory distress.  Musculoskeletal:     Comments: RLE No traumatic wounds, ecchymosis, or rash  Nontender  No knee or ankle effusion  Knee stable to varus/ valgus and anterior/posterior stress  Sens DPN, SPN, TN intact  Motor EHL, ext, flex, evers 5/5  DP 1+, PT 1+, No significant edema  Neurological: She is alert.  Skin: Skin is warm and dry. She is not diaphoretic.  Psychiatric: She has a normal mood and affect. Her behavior is normal.    Assessment/Plan: Right hip fx -- Plan IMN by Dr. Percell Miller tomorrow. NPO after MN. Multiple medical problems including atrial fibrillation, anxiety, and GERD -- per primary team    Kristy Abu, PA-C Orthopedic Surgery [phone] 11/28/2018, 1:41 PM

## 2018-11-28 NOTE — ED Notes (Signed)
Pt very sleepy. When RN attempts to wake pt by shaking her shoulder she barely opens her eyes, no response with name calling. Pt doesn't talk, nor is she able to keep her eyes open longer than about 1-2 seconds. Pt falls back to sleep quickly. Pt snoring very deeply. Dr. Laverta Baltimore aware.

## 2018-11-28 NOTE — ED Notes (Addendum)
Dr. Laverta Baltimore made aware that pt's Cardizem drip is at max rate of 15mg /hr and HR fluctuating between 110-125bpm.

## 2018-11-28 NOTE — Progress Notes (Signed)
ANTICOAGULATION CONSULT NOTE - Initial Consult  Pharmacy Consult for heparin Indication: atrial fibrillation  Allergies  Allergen Reactions  . Penicillins Anaphylaxis  . Feldene [Piroxicam] Nausea And Vomiting  . Sulfa Antibiotics     swelling    Patient Measurements: Height: 5\' 5"  (165.1 cm) Weight: 147 lb 0.8 oz (66.7 kg) IBW/kg (Calculated) : 57 Heparin Dosing Weight: 67 kg  Vital Signs: Temp: 97.7 F (36.5 C) (05/21 0507) Temp Source: Oral (05/21 0507) BP: 139/88 (05/21 1000) Pulse Rate: 135 (05/21 1000)  Labs: Recent Labs    11/28/18 0607  HGB 11.8*  HCT 38.8  PLT 226  CREATININE 0.68    Estimated Creatinine Clearance: 47.1 mL/min (by C-G formula based on SCr of 0.68 mg/dL).   Medical History: Past Medical History:  Diagnosis Date  . Chronic back pain   . Diarrhea   . Dysphagia 05/03/2016  . GERD (gastroesophageal reflux disease)     Medications:  (Not in a hospital admission)   Assessment: Pharmacy consulted to dose heparin in patient with atrial fibrillation.  Patient recently discharged from ICU on 5/20 and readmitted after fall.  Started on apixaban with last dose 5/20 2100.  Will need to monitor based on aPTT levels until it correlates to heparin level.  Goal of Therapy:  Heparin level 0.3-0.7 units/ml aPTT 66-102 seconds Monitor platelets by anticoagulation protocol: Yes   Plan:  Give 3300 units bolus x 1 Start heparin infusion at 950 units/hr Check anti-Xa level in 8 hours and daily while on heparin Continue to monitor H&H and platelets   Revonda Standard Marry Kusch 11/28/2018,10:27 AM

## 2018-11-28 NOTE — ED Provider Notes (Signed)
[facility] EMERGENCY DEPARTMENT Provider Note   CSN: [id number] Arrival date & time: 11/28/18  0500    History   Chief Complaint Chief Complaint  Patient presents with  . Fall    HPI Kristy Mckinney is a 85 y.o. female.     Patient got up to go the bathroom this morning and fell at home.  She was recently diagnosed with atrial fib and is taking Eliquis  The history is provided by the patient. No language interpreter was used.  Fall  This is a new problem. The current episode started 1 to 2 hours ago. The problem occurs constantly. The problem has been resolved. Pertinent negatives include no chest pain, no abdominal pain and no headaches. Exacerbated by: Moving right hip. Nothing relieves the symptoms. She has tried nothing for the symptoms. The treatment provided no relief.    Past Medical History:  Diagnosis Date  . Chronic back pain   . Diarrhea   . Dysphagia 05/03/2016  . GERD (gastroesophageal reflux disease)     Patient Active Problem List   Diagnosis Date Noted  . Atrial fibrillation with rapid ventricular response ([place]) 11/25/2018  . Atrial fibrillation with RVR ([place]) 11/24/2018  . Chronic back pain 11/24/2018  . Anxiety and depression 11/24/2018  . Constipation 05/03/2016  . Dysphagia 05/03/2016  . Cholecystitis with cholelithiasis 01/29/2014    Past Surgical History:  Procedure Laterality Date  . ABDOMINAL HYSTERECTOMY    . BACK SURGERY    . CHOLECYSTECTOMY N/A 01/29/2014   Procedure: LAPAROSCOPIC CHOLECYSTECTOMY;  Surgeon: Scherry Ran, MD;  Location: [facility] ORS;  Service: General;  Laterality: N/A;  . COLONOSCOPY N/A 10/27/2015   Procedure: COLONOSCOPY;  Surgeon: Rogene Houston, MD;  Location: [facility] ENDO SUITE;  Service: Endoscopy;  Laterality: N/A;  215  . TONSILLECTOMY    . YAG LASER APPLICATION Left 63/14/9702   Procedure: YAG LASER APPLICATION;  Surgeon: Elta Guadeloupe T. Gershon Crane, MD;  Location: [facility] ORS;  Service: Ophthalmology;  Laterality: Left;  left      OB History   No obstetric history on file.      Home Medications    Prior to Admission medications   Medication Sig Start Date End Date Taking? Authorizing Provider  alendronate (FOSAMAX) 70 MG tablet Take 70 mg by mouth once a week. Take with a full glass of water on an empty stomach.    [provider]  ALPRAZolam Duanne Moron) 1 MG tablet Take 1 mg by mouth at bedtime.    [provider]  apixaban (ELIQUIS) 2.5 MG TABS tablet Take 1 tablet (2.5 mg total) by mouth 2 (two) times daily. 11/27/18   [name], MD  Biotin w/ Vitamins C & E (HAIR/SKIN/NAILS PO) Take 1 tablet by mouth daily.    [provider]  calcium-vitamin D (OS-CAL 500 + D) 500-200 MG-UNIT per tablet Take 1 tablet by mouth 2 (two) times daily.     [provider]  diltiazem (CARDIZEM CD) 240 MG 24 hr capsule Take 1 capsule (240 mg total) by mouth daily. 11/27/18   [name], MD  diphenoxylate-atropine (LOMOTIL) 2.5-0.025 MG tablet Take by mouth 4 (four) times daily as needed for diarrhea or loose stools.    [provider]  HYDROcodone-acetaminophen (NORCO) 10-325 MG tablet 1 tablet 2 (two) times daily as needed. 10/25/15   [provider]  loperamide (IMODIUM) 2 MG capsule Take by mouth as needed for diarrhea or loose stools.    [provider]  Multiple Vitamin (MULTIVITAMIN WITH MINERALS) TABS Take 1 tablet by mouth daily.    [provider]  omeprazole (PRILOSEC) 20 MG capsule Take 20 mg by mouth every morning.     [provider]  Polyethyl Glycol-Propyl Glycol (SYSTANE OP) Apply 1 drop to eye daily as needed. Dry Eyes    [provider]  sertraline (ZOLOFT) 50 MG tablet Take 50 mg by mouth every morning.     [provider]    Family History No family history on file.  Social History Social History   Tobacco Use  . Smoking status: Never Smoker  . Smokeless tobacco: Never Used  Substance Use Topics  .  Alcohol use: No  . Drug use: No     Allergies   Penicillins; Feldene [piroxicam]; and Sulfa antibiotics   Review of Systems Review of Systems  Constitutional: Negative for appetite change and fatigue.  HENT: Negative for congestion, ear discharge and sinus pressure.   Eyes: Negative for discharge.  Respiratory: Negative for cough.   Cardiovascular: Negative for chest pain.  Gastrointestinal: Negative for abdominal pain and diarrhea.  Genitourinary: Negative for frequency and hematuria.  Musculoskeletal: Negative for back pain.       Painful right hip  Skin: Negative for rash.  Neurological: Negative for seizures and headaches.  Psychiatric/Behavioral: Negative for hallucinations.     Physical Exam Updated Vital Signs BP (!) 158/116   Pulse 93   Temp 97.7 F (36.5 C) (Oral)   Resp (!) 39   Ht 5\' 5"  (1.651 m)   Wt 66.7 kg   SpO2 (!) 88%   BMI 24.47 kg/m   Physical Exam Vitals signs and nursing note reviewed.  Constitutional:      Appearance: She is well-developed.  HENT:     Head: Normocephalic.     Nose: Nose normal.  Eyes:     General: No scleral icterus.    Conjunctiva/sclera: Conjunctivae normal.  Neck:     Musculoskeletal: Neck supple.     Thyroid: No thyromegaly.  Cardiovascular:     Rate and Rhythm: Normal rate and regular rhythm.     Heart sounds: No murmur. No friction rub. No gallop.   Pulmonary:     Breath sounds: No stridor. No wheezing or rales.  Chest:     Chest wall: No tenderness.  Abdominal:     General: There is no distension.     Tenderness: There is no abdominal tenderness. There is no rebound.  Musculoskeletal:     Comments: Tender right hip  Lymphadenopathy:     Cervical: No cervical adenopathy.  Skin:    Findings: No erythema or rash.  Neurological:     Mental Status: She is oriented to person, place, and time.     Motor: No abnormal muscle tone.     Coordination: Coordination normal.  Psychiatric:        Behavior: Behavior  normal.      ED Treatments / Results  Labs (all labs ordered are listed, but only abnormal results are displayed) Labs Reviewed  CBC WITH DIFFERENTIAL/PLATELET - Abnormal; Notable for the following components:      Result Value   Hemoglobin 11.8 (*)    RDW 16.2 (*)    All other components within normal limits  SARS CORONAVIRUS 2 (HOSPITAL ORDER, [place] [facility])  BASIC METABOLIC PANEL    EKG EKG Interpretation  Date/Time:  Thursday Nov 28 2018 05:06:36 EDT Ventricular Rate:  106 PR Interval:  QRS Duration: 79 QT Interval:  359 QTC Calculation: 477 R Axis:   -44 Text Interpretation:  Atrial fibrillation Left anterior fascicular block Anterior infarct, old Confirmed by Milton Ferguson [phone]) on 11/28/2018 5:12:08 AM   Radiology Dg Hip Unilat W Or Wo Pelvis 2-3 Views Right  Result Date: 11/28/2018 CLINICAL DATA:  Recurrent falls.  Right hip pain EXAM: DG HIP (WITH OR WITHOUT PELVIS) 2-3V RIGHT COMPARISON:  12/16/2013 FINDINGS: Unexpected lucency obliquely across the proximal femur at the level of the intertrochanteric segment. This persists on the full pelvis view. No dislocation. IMPRESSION: Probable nondisplaced intertrochanteric femur fracture on the right. Would confirm with CT prior to any surgery. Electronically Signed   By: Monte Fantasia M.D.   On: 11/28/2018 05:59    Procedures Procedures (including critical care time)  Medications Ordered in ED Medications  HYDROmorphone (DILAUDID) injection 0.5 mg (has no administration in time range)  ondansetron (ZOFRAN) injection 4 mg (has no administration in time range)     Initial Impression / Assessment and Plan / ED Course  I have reviewed the triage vital signs and the nursing notes.  Pertinent labs & imaging results that were available during my care of the patient were reviewed by me and considered in my medical decision making (see chart for details).   Patient with probable right hip  fracture.  Patient is going to get a CT scan recommended by radiology       Final Clinical Impressions(s) / ED Diagnoses   Final diagnoses:  SOB (shortness of breath)    ED Discharge Orders    None       Milton Ferguson, MD 11/28/18 [phone]

## 2018-11-28 NOTE — Plan of Care (Signed)

## 2018-11-28 NOTE — Progress Notes (Signed)
ANTICOAGULATION CONSULT NOTE - Follow Up Consult  Pharmacy Consult for heparin Indication: atrial fibrillation  Allergies  Allergen Reactions  . Penicillins Anaphylaxis  . Feldene [Piroxicam] Nausea And Vomiting  . Sulfa Antibiotics     swelling    Patient Measurements: Height: 5\' 5"  (165.1 cm) Weight: 141 lb 15.6 oz (64.4 kg) IBW/kg (Calculated) : 57 Heparin Dosing Weight: 67 kg  Vital Signs: Temp: 98.5 F (36.9 C) (05/21 1708) Temp Source: Axillary (05/21 1708) BP: 131/52 (05/21 1708) Pulse Rate: 103 (05/21 1708)  Labs: Recent Labs    11/28/18 0607 11/28/18 1055 11/28/18 1906  HGB 11.8*  --   --   HCT 38.8  --   --   PLT 226  --   --   APTT  --  38* >200*  HEPARINUNFRC  --  1.86*  --   CREATININE 0.68  --   --     Estimated Creatinine Clearance: 47.1 mL/min (by C-G formula based on SCr of 0.68 mg/dL).   Medical History: Past Medical History:  Diagnosis Date  . Chronic back pain   . Diarrhea   . Dysphagia 05/03/2016  . GERD (gastroesophageal reflux disease)     Medications:  Medications Prior to Admission  Medication Sig Dispense Refill Last Dose  . alendronate (FOSAMAX) 70 MG tablet Take 70 mg by mouth once a week. Take with a full glass of water on an empty stomach.   Past Week at Unknown time  . ALPRAZolam (XANAX) 1 MG tablet Take 1 mg by mouth at bedtime.   11/27/2018 at 21:00  . apixaban (ELIQUIS) 2.5 MG TABS tablet Take 1 tablet (2.5 mg total) by mouth 2 (two) times daily. 60 tablet 0 11/27/2018 at 21:00  . Biotin w/ Vitamins C & E (HAIR/SKIN/NAILS PO) Take 1 tablet by mouth daily.   Past Week at Unknown time  . calcium-vitamin D (OS-CAL 500 + D) 500-200 MG-UNIT per tablet Take 1 tablet by mouth 2 (two) times daily.    Past Week at Unknown time  . diltiazem (CARDIZEM CD) 240 MG 24 hr capsule Take 1 capsule (240 mg total) by mouth daily. 30 capsule 12 Past Week at Unknown time  . diphenoxylate-atropine (LOMOTIL) 2.5-0.025 MG tablet Take by mouth 4  (four) times daily as needed for diarrhea or loose stools.   Past Week at Unknown time  . HYDROcodone-acetaminophen (NORCO) 10-325 MG tablet 1 tablet 2 (two) times daily as needed.   Past Week at Unknown time  . loperamide (IMODIUM) 2 MG capsule Take by mouth as needed for diarrhea or loose stools.   Past Week at Unknown time  . Multiple Vitamin (MULTIVITAMIN WITH MINERALS) TABS Take 1 tablet by mouth daily.   Past Week at Unknown time  . omeprazole (PRILOSEC) 20 MG capsule Take 20 mg by mouth every morning.    Past Week at Unknown time  . Polyethyl Glycol-Propyl Glycol (SYSTANE OP) Apply 1 drop to eye daily as needed. Dry Eyes   Past Week at Unknown time  . sertraline (ZOLOFT) 50 MG tablet Take 50 mg by mouth every morning.    Past Week at Unknown time    Assessment: Pharmacy consulted to dose heparin in patient with atrial fibrillation.  Patient recently discharged from ICU on 5/20 and readmitted after fall.  Started on apixaban with last dose 5/20 2100.  Will need to monitor based on aPTT levels until it correlates to heparin level. Heparin drip 950 uts/hr Aptt > 200 drawn correctly -  spoke to phlebotomist -   Goal of Therapy:  Heparin level 0.3-0.7 units/ml aPTT 66-102 seconds Monitor platelets by anticoagulation protocol: Yes   Plan:  Hold heparin drip x45min Decrease heparin 700 uts/hr Turn heparin off at 0700 for Sidell.D. CPP, BCPS Clinical Pharmacist [phone] 11/28/2018 8:53 PM

## 2018-11-28 NOTE — ED Notes (Signed)
When pt's name is called and doctor touches pt's shoulder, pt now opens her eyes easily and raises her hands. Pt still doesn't talk but this is more response than we were seeing prior to Narcan.

## 2018-11-28 NOTE — H&P (Signed)
History and Physical  [name]:[id number] DOB: [birth year] DOA: 11/28/2018   PCP: [name], MD   Patient coming from: Home  Chief Complaint: fall  HPI:  Kristy Mckinney is a 85 y.o. female with medical atrial fibrillation, anxiety, GERD presenting after mechanical fall.  There was no syncope.  The patient was recently discharged from the hospital after a stay from 11/24/2018 through 11/27/2018 where she was diagnosed with new onset atrial fibrillation.  The patient was discharged home with diltiazem 240 mg daily and apixaban.  She had follow-up set up with cardiology.  Unfortunately, in the early morning of 11/28/2018, the patient was going to the bathroom when she tripped on her rug and fell and had immediate pain on her right side.  EMS was activated, and the patient was brought to emergency department for further evaluation.  CT of the right hip revealed a nondisplaced intratrochanteric right hip fracture.  Orthopedics, Dr. Aline Brochure recommended transfer to [facility] as the patient needed close cardiology follow-up.  Subsequently, orthopedics, Dr. Percell Miller was contacted and agreed to consult on the patient.  He requested n.p.o. after midnight for possible surgery on 11/29/2018. In the emergency department, the patient was given Dilaudid 0.5 mg IV.  She became obtunded and required Narcan administration after which she became more alert but remained a little confused.  She was hemodynamically stable and afebrile.  She did develop atrial fibrillation with RVR with heart rates into the 120s.  She was started on diltiazem drip.  Assessment/Plan: Nondisplaced right intertrochanteric femur fracture -Orthopedics, Dr. Percell Miller notified--please contact him after transfer to [facility] -N.p.o. after midnight for possible surgery 11/29/2018 -Judicious pain control--patient became obtunded after 1 dose of Dilaudid 0.5 mg IV requiring Narcan -PT/OT after surgery  Atrial fibrillation with  RVR -Patient recently diagnosed with new onset atrial fibrillation during her admission on 11/24/2018 -11/25/2018 echo EF >60%; mild MS, moderate TR -Continue diltiazem drip -IV heparin in preparation for the patient surgery -Last dose of apixaban evening 7/61/9509  Acute diastolic CHF -The patient is stable on room air -Appears to be mild clinically -Chest x-ray revealed increased interstitial markings and bilateral pleural effusion -Furosemide 40 mg IV x1 given -Reassess patient 11/29/2018 for repeat dosing of furosemide -Daily weights  Anxiety -Holding evening dose of Xanax due to the patient's somnolence  GERD -Continue Protonix        Past Medical History:  Diagnosis Date   Chronic back pain    Diarrhea    Dysphagia 05/03/2016   GERD (gastroesophageal reflux disease)    Past Surgical History:  Procedure Laterality Date   ABDOMINAL HYSTERECTOMY     BACK SURGERY     CHOLECYSTECTOMY N/A 01/29/2014   Procedure: LAPAROSCOPIC CHOLECYSTECTOMY;  Surgeon: Scherry Ran, MD;  Location: [facility] ORS;  Service: General;  Laterality: N/A;   COLONOSCOPY N/A 10/27/2015   Procedure: COLONOSCOPY;  Surgeon: Rogene Houston, MD;  Location: [facility] ENDO SUITE;  Service: Endoscopy;  Laterality: N/A;  215   TONSILLECTOMY     YAG LASER APPLICATION Left 32/67/1245   Procedure: YAG LASER APPLICATION;  Surgeon: Elta Guadeloupe T. Gershon Crane, MD;  Location: [facility] ORS;  Service: Ophthalmology;  Laterality: Left;  left   Social History:  reports that she has never smoked. She has never used smokeless tobacco. She reports that she does not drink alcohol or use drugs.   FAmily history--unobtainable due to pt's encephalopathy  Allergies  Allergen Reactions   Penicillins Anaphylaxis  Feldene [Piroxicam] Nausea And Vomiting   Sulfa Antibiotics     swelling     Prior to Admission medications   Medication Sig Start Date End Date Taking? Authorizing Provider  alendronate (FOSAMAX) 70 MG tablet Take  70 mg by mouth once a week. Take with a full glass of water on an empty stomach.    [provider]  ALPRAZolam Duanne Moron) 1 MG tablet Take 1 mg by mouth at bedtime.    [provider]  apixaban (ELIQUIS) 2.5 MG TABS tablet Take 1 tablet (2.5 mg total) by mouth 2 (two) times daily. 11/27/18   [name], MD  Biotin w/ Vitamins C & E (HAIR/SKIN/NAILS PO) Take 1 tablet by mouth daily.    [provider]  calcium-vitamin D (OS-CAL 500 + D) 500-200 MG-UNIT per tablet Take 1 tablet by mouth 2 (two) times daily.     [provider]  diltiazem (CARDIZEM CD) 240 MG 24 hr capsule Take 1 capsule (240 mg total) by mouth daily. 11/27/18   [name], MD  diphenoxylate-atropine (LOMOTIL) 2.5-0.025 MG tablet Take by mouth 4 (four) times daily as needed for diarrhea or loose stools.    [provider]  HYDROcodone-acetaminophen (NORCO) 10-325 MG tablet 1 tablet 2 (two) times daily as needed. 10/25/15   [provider]  loperamide (IMODIUM) 2 MG capsule Take by mouth as needed for diarrhea or loose stools.    [provider]  Multiple Vitamin (MULTIVITAMIN WITH MINERALS) TABS Take 1 tablet by mouth daily.    [provider]  omeprazole (PRILOSEC) 20 MG capsule Take 20 mg by mouth every morning.     [provider]  Polyethyl Glycol-Propyl Glycol (SYSTANE OP) Apply 1 drop to eye daily as needed. Dry Eyes    [provider]  sertraline (ZOLOFT) 50 MG tablet Take 50 mg by mouth every morning.     [provider]    Review of Systems:  Unobtainable due to patient' somnolence Physical Exam: Vitals:   11/28/18 0830 11/28/18 0900 11/28/18 0930 11/28/18 1000  BP: 139/76 136/74 129/68 139/88  Pulse: 74 (!) 115 (!) 117 (!) 135  Resp: 12 (!) 23 (!) 25 19  Temp:      TempSrc:      SpO2: 97% 94% 95% 93%  Weight:      Height:       General:  A&O x 3, NAD, nontoxic, pleasant/cooperative Head/Eye: No conjunctival  hemorrhage, no icterus, [place]/AT, No nystagmus ENT:  No icterus,  No thrush, good dentition, no pharyngeal exudate Neck:  No masses, no lymphadenpathy, no bruits CV:  IRRR, no rub, no gallop, no S3 Lung:  Bibasilar crackles, no wheeze Abdomen: soft/NT, +BS, nondistended, no peritoneal signs Ext: No cyanosis, No rashes, No petechiae, No lymphangitis, No edema Neuro: CNII-XII intact, strength 4/5 in bilateral upper and lower extremities, no dysmetria  Labs on Admission:  Basic Metabolic Panel: Recent Labs  Lab 11/24/18 1135 11/25/18 0735 11/28/18 0607  NA 142 141 138  K 3.7 3.4* 3.8  CL 103 104 101  CO2 25 26 26   GLUCOSE 102* 95 115*  BUN 18 14 16   CREATININE 0.82 0.71 0.68  CALCIUM 9.5 8.9 9.2  MG  --  1.5*  --    Liver Function Tests: Recent Labs  Lab 11/24/18 1135  AST 30  ALT 20  ALKPHOS 59  BILITOT 2.2*  PROT 7.1  ALBUMIN 4.1   No results for input(s): LIPASE, AMYLASE in the  last 168 hours. No results for input(s): AMMONIA in the last 168 hours. CBC: Recent Labs  Lab 11/24/18 1135 11/28/18 0607  WBC 6.9 8.1  NEUTROABS 5.2 6.5  HGB 12.5 11.8*  HCT 40.0 38.8  MCV 85.3 85.8  PLT 256 226   Coagulation Profile: No results for input(s): INR, PROTIME in the last 168 hours. Cardiac Enzymes: Recent Labs  Lab 11/24/18 1135  TROPONINI <0.03   BNP: Invalid input(s): POCBNP CBG: No results for input(s): GLUCAP in the last 168 hours. Urine analysis: No results found for: COLORURINE, APPEARANCEUR, LABSPEC, PHURINE, GLUCOSEU, HGBUR, BILIRUBINUR, KETONESUR, PROTEINUR, UROBILINOGEN, NITRITE, LEUKOCYTESUR Sepsis Labs: @LABRCNTIP (procalcitonin:4,lacticidven:4) ) Recent Results (from the past 240 hour(s))  SARS Coronavirus 2 (CEPHEID - Performed in [place] hospital lab), Hosp Order     Status: None   Collection Time: 11/24/18  4:13 PM  Result Value Ref Range Status   SARS Coronavirus 2 NEGATIVE NEGATIVE Final    Comment: (NOTE) If result is  NEGATIVE SARS-CoV-2 target nucleic acids are NOT DETECTED. The SARS-CoV-2 RNA is generally detectable in upper and lower  respiratory specimens during the acute phase of infection. The lowest  concentration of SARS-CoV-2 viral copies this assay can detect is 250  copies / mL. A negative result does not preclude SARS-CoV-2 infection  and should not be used as the sole basis for treatment or other  patient management decisions.  A negative result may occur with  improper specimen collection / handling, submission of specimen other  than nasopharyngeal swab, presence of viral mutation(s) within the  areas targeted by this assay, and inadequate number of viral copies  (<250 copies / mL). A negative result must be combined with clinical  observations, patient history, and epidemiological information. If result is POSITIVE SARS-CoV-2 target nucleic acids are DETECTED. The SARS-CoV-2 RNA is generally detectable in upper and lower  respiratory specimens dur ing the acute phase of infection.  Positive  results are indicative of active infection with SARS-CoV-2.  Clinical  correlation with patient history and other diagnostic information is  necessary to determine patient infection status.  Positive results do  not rule out bacterial infection or co-infection with other viruses. If result is PRESUMPTIVE POSTIVE SARS-CoV-2 nucleic acids MAY BE PRESENT.   A presumptive positive result was obtained on the submitted specimen  and confirmed on repeat testing.  While 2019 novel coronavirus  (SARS-CoV-2) nucleic acids may be present in the submitted sample  additional confirmatory testing may be necessary for epidemiological  and / or clinical management purposes  to differentiate between  SARS-CoV-2 and other Sarbecovirus currently known to infect humans.  If clinically indicated additional testing with an alternate test  methodology [phone]) is advised. The SARS-CoV-2 RNA is generally  detectable  in upper and lower respiratory sp ecimens during the acute  phase of infection. The expected result is Negative. Fact Sheet for Patients:  StrictlyIdeas.no Fact Sheet for Healthcare Providers: BankingDealers.co.za This test is not yet approved or cleared by the [place] FDA and has been authorized for detection and/or diagnosis of SARS-CoV-2 by FDA under an Emergency Use Authorization (EUA).  This EUA will remain in effect (meaning this test can be used) for the duration of the COVID-19 declaration under Section 564(b)(1) of the Act, 21 U.S.C. section 360bbb-3(b)(1), unless the authorization is terminated or revoked sooner. Performed at [facility], [address] [place], [place] [address]   MRSA PCR Screening     Status: None   Collection Time: 11/24/18  6:21 PM  Result Value Ref Range Status   MRSA by PCR NEGATIVE NEGATIVE Final    Comment:        The GeneXpert MRSA Assay (FDA approved for NASAL specimens only), is one component of a comprehensive MRSA colonization surveillance program. It is not intended to diagnose MRSA infection nor to guide or monitor treatment for MRSA infections. Performed at [facility], [address] [place], [place] [address]   SARS Coronavirus 2 (CEPHEID - Performed in [facility] hospital lab), Hosp Order     Status: None   Collection Time: 11/28/18  6:04 AM  Result Value Ref Range Status   SARS Coronavirus 2 NEGATIVE NEGATIVE Final    Comment: (NOTE) If result is NEGATIVE SARS-CoV-2 target nucleic acids are NOT DETECTED. The SARS-CoV-2 RNA is generally detectable in upper and lower  respiratory specimens during the acute phase of infection. The lowest  concentration of SARS-CoV-2 viral copies this assay can detect is 250  copies / mL. A negative result does not preclude SARS-CoV-2 infection  and should not be used as the sole basis for treatment or other  patient management decisions.  A  negative result may occur with  improper specimen collection / handling, submission of specimen other  than nasopharyngeal swab, presence of viral mutation(s) within the  areas targeted by this assay, and inadequate number of viral copies  (<250 copies / mL). A negative result must be combined with clinical  observations, patient history, and epidemiological information. If result is POSITIVE SARS-CoV-2 target nucleic acids are DETECTED. The SARS-CoV-2 RNA is generally detectable in upper and lower  respiratory specimens dur ing the acute phase of infection.  Positive  results are indicative of active infection with SARS-CoV-2.  Clinical  correlation with patient history and other diagnostic information is  necessary to determine patient infection status.  Positive results do  not rule out bacterial infection or co-infection with other viruses. If result is PRESUMPTIVE POSTIVE SARS-CoV-2 nucleic acids MAY BE PRESENT.   A presumptive positive result was obtained on the submitted specimen  and confirmed on repeat testing.  While 2019 novel coronavirus  (SARS-CoV-2) nucleic acids may be present in the submitted sample  additional confirmatory testing may be necessary for epidemiological  and / or clinical management purposes  to differentiate between  SARS-CoV-2 and other Sarbecovirus currently known to infect humans.  If clinically indicated additional testing with an alternate test  methodology [phone]) is advised. The SARS-CoV-2 RNA is generally  detectable in upper and lower respiratory sp ecimens during the acute  phase of infection. The expected result is Negative. Fact Sheet for Patients:  StrictlyIdeas.no Fact Sheet for Healthcare Providers: BankingDealers.co.za This test is not yet approved or cleared by the [place] FDA and has been authorized for detection and/or diagnosis of SARS-CoV-2 by FDA under an Emergency Use  Authorization (EUA).  This EUA will remain in effect (meaning this test can be used) for the duration of the COVID-19 declaration under Section 564(b)(1) of the Act, 21 U.S.C. section 360bbb-3(b)(1), unless the authorization is terminated or revoked sooner. Performed at [facility], [address] [place], [place] [address]      Radiological Exams on Admission: Dg Chest 1 View  Result Date: 11/28/2018 CLINICAL DATA:  Hip fracture EXAM: CHEST  1 VIEW COMPARISON:  11/24/2018 FINDINGS: Cardiomegaly. Mitral annular calcification. Small pleural effusions and diffuse interstitial opacity. No pneumothorax. No appreciable fracture. IMPRESSION: CHF pattern. Electronically Signed   By: Neva Seat.D.  On: 11/28/2018 07:00   Ct Head Wo Contrast  Result Date: 11/28/2018 CLINICAL DATA:  Multiple falls EXAM: CT HEAD WITHOUT CONTRAST CT CERVICAL SPINE WITHOUT CONTRAST TECHNIQUE: Multidetector CT imaging of the head and cervical spine was performed following the standard protocol without intravenous contrast. Multiplanar CT image reconstructions of the cervical spine were also generated. COMPARISON:  11/25/2018 head CT FINDINGS: CT HEAD FINDINGS Brain: No evidence of acute infarction, hemorrhage, hydrocephalus, extra-axial collection or mass lesion/mass effect. Mild chronic small vessel ischemia and volume loss for age. Vascular: Atherosclerosis Skull: Negative for fracture Sinuses/Orbits: No evidence of injury.  Bilateral cataract resection CT CERVICAL SPINE FINDINGS Alignment: Normal Skull base and vertebrae: Negative for acute fracture. Soft tissues and spinal canal: No prevertebral fluid or swelling. No visible canal hematoma. Disc levels: Calcified disc herniation at C5-6 with size implying flattening of the cord. Disc narrowing and uncovertebral spurring causes foraminal stenosis at C4-5 to C6-7. Upper chest: A right pleural effusion is minimally covered. This was also seen on recent chest x-ray  11/24/2018 IMPRESSION: 1. No evidence of intracranial injury or cervical spine fracture. 2. Cervical spine degeneration with notable calcified disc herniation at C5-6 that impinges on the cord. Electronically Signed   By: Monte Fantasia M.D.   On: 11/28/2018 06:53   Ct Cervical Spine Wo Contrast  Result Date: 11/28/2018 CLINICAL DATA:  Multiple falls EXAM: CT HEAD WITHOUT CONTRAST CT CERVICAL SPINE WITHOUT CONTRAST TECHNIQUE: Multidetector CT imaging of the head and cervical spine was performed following the standard protocol without intravenous contrast. Multiplanar CT image reconstructions of the cervical spine were also generated. COMPARISON:  11/25/2018 head CT FINDINGS: CT HEAD FINDINGS Brain: No evidence of acute infarction, hemorrhage, hydrocephalus, extra-axial collection or mass lesion/mass effect. Mild chronic small vessel ischemia and volume loss for age. Vascular: Atherosclerosis Skull: Negative for fracture Sinuses/Orbits: No evidence of injury.  Bilateral cataract resection CT CERVICAL SPINE FINDINGS Alignment: Normal Skull base and vertebrae: Negative for acute fracture. Soft tissues and spinal canal: No prevertebral fluid or swelling. No visible canal hematoma. Disc levels: Calcified disc herniation at C5-6 with size implying flattening of the cord. Disc narrowing and uncovertebral spurring causes foraminal stenosis at C4-5 to C6-7. Upper chest: A right pleural effusion is minimally covered. This was also seen on recent chest x-ray 11/24/2018 IMPRESSION: 1. No evidence of intracranial injury or cervical spine fracture. 2. Cervical spine degeneration with notable calcified disc herniation at C5-6 that impinges on the cord. Electronically Signed   By: Monte Fantasia M.D.   On: 11/28/2018 06:53   Ct Hip Right Wo Contrast  Result Date: 11/28/2018 CLINICAL DATA:  Fall with right hip pain.  Abnormal radiograph EXAM: CT OF THE RIGHT HIP WITHOUT CONTRAST TECHNIQUE: Multidetector CT imaging of the  right hip was performed according to the standard protocol. Multiplanar CT image reconstructions were also generated. COMPARISON:  Radiography from earlier today FINDINGS: Oblique fracture through the intertrochanteric right femur without displacement. There is mild comminution at the greater trochanter. The hip is located. The visualized right hemipelvis is intact. Regional soft tissue edema and small joint effusion. Pelvic ascites which is trace where visualized and likely from volume overload given chest x-ray. IMPRESSION: Nondisplaced intertrochanteric right femur fracture. Electronically Signed   By: Monte Fantasia M.D.   On: 11/28/2018 06:59   Dg Hip Unilat W Or Wo Pelvis 2-3 Views Right  Result Date: 11/28/2018 CLINICAL DATA:  Recurrent falls.  Right hip pain EXAM: DG HIP (WITH OR WITHOUT PELVIS) 2-3V  RIGHT COMPARISON:  12/16/2013 FINDINGS: Unexpected lucency obliquely across the proximal femur at the level of the intertrochanteric segment. This persists on the full pelvis view. No dislocation. IMPRESSION: Probable nondisplaced intertrochanteric femur fracture on the right. Would confirm with CT prior to any surgery. Electronically Signed   By: Monte Fantasia M.D.   On: 11/28/2018 05:59    EKG: Independently reviewed. Afib, nonspecific T wave change    Time spent:60 minutes Code Status:   FULL Family Communication:  No Family at bedside Disposition Plan: expect 2-3 day hospitalization Consults called: ortho--Murphy DVT Prophylaxis:IV heparin  Orson Eva, DO  [facility] Pager [phone]  If 7PM-7AM, please contact night-coverage [email or web address] Password [email or web address] 11/28/2018, 10:15 AM

## 2018-11-28 NOTE — Progress Notes (Signed)
Initial Nutrition Assessment  RD working remotely.  DOCUMENTATION CODES:   Not applicable  INTERVENTION:   -Once diet is advanced, add:   -Ensure Enlive po BID, each supplement provides 350 kcal and 20 grams of protein -MVI with minerals daily  NUTRITION DIAGNOSIS:   Increased nutrient needs related to post-op healing as evidenced by estimated needs.  GOAL:   Patient will meet greater than or equal to 90% of their needs  MONITOR:   PO intake, Supplement acceptance, Diet advancement, Labs, Weight trends, Skin, I & O's  REASON FOR ASSESSMENT:   Consult Hip fracture protocol  ASSESSMENT:   Kristy Mckinney is a 85 y.o. female with medical atrial fibrillation, anxiety, GERD presenting after mechanical fall.  There was no syncope.  The patient was recently discharged from the hospital after a stay from 11/24/2018 through 11/27/2018 where she was diagnosed with new onset atrial fibrillation.  The patient was discharged home with diltiazem 240 mg daily and apixaban.  She had follow-up set up with cardiology.  Pt admitted with nondisplaced rt intertochanteric femur fx.   Reviewed I/O's: -400 ml x 24 hours  Pt awaiting orthopedic evaluation; likely surgery planned for tomorrow (11/29/18).    Attempted to speak with pt via phone to obtain further history, however, unable to reach.   Reviewed wt hx; noted wt has been stable over the past 3 months.   Pt with increased nutritional needs for post-op healing and would benefit from addition of oral nutrition supplements once diet is advanced.   Labs reviewed.   NUTRITION - FOCUSED PHYSICAL EXAM:    Most Recent Value  Orbital Region  Unable to assess  Upper Arm Region  Unable to assess  Thoracic and Lumbar Region  Unable to assess  Buccal Region  Unable to assess  Temple Region  Unable to assess  Clavicle Bone Region  Unable to assess  Clavicle and Acromion Bone Region  Unable to assess  Scapular Bone Region  Unable to assess   Dorsal Hand  Unable to assess  Patellar Region  Unable to assess  Anterior Thigh Region  Unable to assess  Posterior Calf Region  Unable to assess  Edema (RD Assessment)  Unable to assess  Hair  Unable to assess  Eyes  Unable to assess  Mouth  Unable to assess  Skin  Unable to assess  Nails  Unable to assess       Diet Order:   Diet Order    None      EDUCATION NEEDS:   No education needs have been identified at this time  Skin:  Skin Assessment: Reviewed RN Assessment  Last BM:  Unknown  Height:   Ht Readings from Last 1 Encounters:  11/28/18 5\' 5"  (1.651 m)    Weight:   Wt Readings from Last 1 Encounters:  11/28/18 66.7 kg    Ideal Body Weight:  56.8 kg  BMI:  Body mass index is 24.47 kg/m.  Estimated Nutritional Needs:   Kcal:  1650-1850  Protein:  80-95 grams  Fluid:  > 1.6 L    Carleta Woodrow A. Jimmye Norman, RD, LDN, [place] Registered Dietitian II Certified Diabetes Care and Education Specialist Pager: [phone] After hours Pager: [phone]

## 2018-11-29 ENCOUNTER — Inpatient Hospital Stay (HOSPITAL_COMMUNITY): Payer: PPO

## 2018-11-29 ENCOUNTER — Inpatient Hospital Stay (HOSPITAL_COMMUNITY): Payer: PPO | Admitting: Anesthesiology

## 2018-11-29 ENCOUNTER — Encounter (HOSPITAL_COMMUNITY)
Admission: EM | Disposition: A | Discharge status: Skilled Nursing Facility | Payer: Self-pay | Source: Home / Self Care | Attending: Internal Medicine

## 2018-11-29 ENCOUNTER — Encounter (HOSPITAL_COMMUNITY): Payer: Self-pay | Admitting: Anesthesiology

## 2018-11-29 DIAGNOSIS — S72141A Displaced intertrochanteric fracture of right femur, initial encounter for closed fracture: Secondary | ICD-10-CM

## 2018-11-29 HISTORY — PX: INTRAMEDULLARY (IM) NAIL INTERTROCHANTERIC: SHX5875

## 2018-11-29 LAB — BASIC METABOLIC PANEL
Anion gap: 13 (ref 5–15)
BUN: 17 mg/dL (ref 8–23)
CO2: 27 mmol/L (ref 22–32)
Calcium: 9.3 mg/dL (ref 8.9–10.3)
Chloride: 99 mmol/L (ref 98–111)
Creatinine, Ser: 1.06 mg/dL — ABNORMAL HIGH (ref 0.44–1.00)
GFR calc Af Amer: 56 mL/min — ABNORMAL LOW (ref >60)
GFR calc non Af Amer: 48 mL/min — ABNORMAL LOW (ref >60)
Glucose, Bld: 109 mg/dL — ABNORMAL HIGH (ref 70–99)
Potassium: 4.4 mmol/L (ref 3.5–5.1)
Sodium: 139 mmol/L (ref 135–145)

## 2018-11-29 LAB — CBC
HCT: 35 % — ABNORMAL LOW (ref 36.0–46.0)
Hemoglobin: 10.9 g/dL — ABNORMAL LOW (ref 12.0–15.0)
MCH: 26.3 pg (ref 26.0–34.0)
MCHC: 31.1 g/dL (ref 30.0–36.0)
MCV: 84.3 fL (ref 80.0–100.0)
Platelets: 241 10*3/uL (ref 150–400)
RBC: 4.15 MIL/uL (ref 3.87–5.11)
RDW: 15.9 % — ABNORMAL HIGH (ref 11.5–15.5)
WBC: 10.3 10*3/uL (ref 4.0–10.5)
nRBC: 0 % (ref 0.0–0.2)

## 2018-11-29 SURGERY — FIXATION, FRACTURE, INTERTROCHANTERIC, WITH INTRAMEDULLARY ROD
Anesthesia: General | Site: Hip | Laterality: Right

## 2018-11-29 MED ORDER — APIXABAN 2.5 MG PO TABS
2.5000 mg | ORAL_TABLET | Freq: Two times a day (BID) | ORAL | Status: DC
  Administered 2018-11-30 – 2018-12-05 (×10): 2.5 mg via ORAL
  Filled 2018-11-29 (×10): qty 1

## 2018-11-29 MED ORDER — LACTATED RINGERS IV SOLN
INTRAVENOUS | Status: DC | PRN
  Administered 2018-11-29: 13:00:00 via INTRAVENOUS

## 2018-11-29 MED ORDER — ROCURONIUM BROMIDE 10 MG/ML (PF) SYRINGE
PREFILLED_SYRINGE | INTRAVENOUS | Status: AC
  Filled 2018-11-29: qty 10

## 2018-11-29 MED ORDER — OXYCODONE HCL 5 MG PO TABS
ORAL_TABLET | ORAL | Status: AC
  Filled 2018-11-29: qty 1

## 2018-11-29 MED ORDER — DOCUSATE SODIUM 100 MG PO CAPS
100.0000 mg | ORAL_CAPSULE | Freq: Two times a day (BID) | ORAL | Status: DC
  Administered 2018-11-30 – 2018-12-05 (×8): 100 mg via ORAL
  Filled 2018-11-29 (×9): qty 1

## 2018-11-29 MED ORDER — CLINDAMYCIN PHOSPHATE 600 MG/50ML IV SOLN
600.0000 mg | Freq: Four times a day (QID) | INTRAVENOUS | Status: AC
  Administered 2018-11-29 (×2): 600 mg via INTRAVENOUS
  Filled 2018-11-29 (×2): qty 50

## 2018-11-29 MED ORDER — DILTIAZEM HCL 60 MG PO TABS
60.0000 mg | ORAL_TABLET | Freq: Four times a day (QID) | ORAL | Status: DC
  Administered 2018-11-29 – 2018-12-03 (×16): 60 mg via ORAL
  Filled 2018-11-29 (×16): qty 1

## 2018-11-29 MED ORDER — LIDOCAINE 2% (20 MG/ML) 5 ML SYRINGE
INTRAMUSCULAR | Status: AC
  Filled 2018-11-29: qty 5

## 2018-11-29 MED ORDER — POLYETHYLENE GLYCOL 3350 17 G PO PACK: 17.0000 g | PACK | Freq: Every day | ORAL | Status: DC | PRN

## 2018-11-29 MED ORDER — ONDANSETRON HCL 4 MG/2ML IJ SOLN
4.0000 mg | Freq: Four times a day (QID) | INTRAMUSCULAR | Status: DC | PRN
  Administered 2018-12-04: 4 mg via INTRAVENOUS
  Filled 2018-11-29: qty 2

## 2018-11-29 MED ORDER — METOCLOPRAMIDE HCL 5 MG PO TABS: 5.0000 mg | ORAL_TABLET | Freq: Three times a day (TID) | ORAL | Status: DC | PRN

## 2018-11-29 MED ORDER — SODIUM CHLORIDE 0.9 % IR SOLN
Status: DC | PRN
  Administered 2018-11-29: 1000 mL

## 2018-11-29 MED ORDER — HYDROCODONE-ACETAMINOPHEN 5-325 MG PO TABS: 1.0000 | ORAL_TABLET | Freq: Four times a day (QID) | ORAL | 0 refills | Status: DC | PRN

## 2018-11-29 MED ORDER — SODIUM CHLORIDE 0.9 % IV SOLN
INTRAVENOUS | Status: DC | PRN
  Administered 2018-11-29: 13:00:00 25 ug/min via INTRAVENOUS

## 2018-11-29 MED ORDER — OXYCODONE HCL 5 MG PO TABS
5.0000 mg | ORAL_TABLET | Freq: Once | ORAL | Status: AC | PRN
  Administered 2018-11-29: 5 mg via ORAL

## 2018-11-29 MED ORDER — FENTANYL CITRATE (PF) 100 MCG/2ML IJ SOLN: 25.0000 ug | INTRAMUSCULAR | Status: DC | PRN

## 2018-11-29 MED ORDER — ACETAMINOPHEN 325 MG PO TABS
325.0000 mg | ORAL_TABLET | Freq: Four times a day (QID) | ORAL | Status: DC | PRN
  Administered 2018-11-30 – 2018-12-05 (×9): 650 mg via ORAL
  Filled 2018-11-29 (×9): qty 2

## 2018-11-29 MED ORDER — DEXAMETHASONE SODIUM PHOSPHATE 10 MG/ML IJ SOLN
INTRAMUSCULAR | Status: DC | PRN
  Administered 2018-11-29: 5 mg via INTRAVENOUS

## 2018-11-29 MED ORDER — ARTIFICIAL TEARS OPHTHALMIC OINT
TOPICAL_OINTMENT | OPHTHALMIC | Status: AC
  Filled 2018-11-29: qty 3.5

## 2018-11-29 MED ORDER — ONDANSETRON HCL 4 MG/2ML IJ SOLN
INTRAMUSCULAR | Status: AC
  Filled 2018-11-29: qty 2

## 2018-11-29 MED ORDER — BISACODYL 10 MG RE SUPP: 10.0000 mg | Freq: Every day | RECTAL | Status: DC | PRN

## 2018-11-29 MED ORDER — LIDOCAINE 2% (20 MG/ML) 5 ML SYRINGE
INTRAMUSCULAR | Status: DC | PRN
  Administered 2018-11-29: 100 mg via INTRAVENOUS

## 2018-11-29 MED ORDER — DEXAMETHASONE SODIUM PHOSPHATE 10 MG/ML IJ SOLN
INTRAMUSCULAR | Status: AC
  Filled 2018-11-29: qty 1

## 2018-11-29 MED ORDER — ONDANSETRON HCL 4 MG/2ML IJ SOLN: 4.0000 mg | Freq: Once | INTRAMUSCULAR | Status: DC | PRN

## 2018-11-29 MED ORDER — ACETAMINOPHEN 325 MG PO TABS: 325.0000 mg | ORAL_TABLET | ORAL | Status: DC | PRN

## 2018-11-29 MED ORDER — OXYCODONE HCL 5 MG/5ML PO SOLN: 5.0000 mg | Freq: Once | ORAL | Status: AC | PRN

## 2018-11-29 MED ORDER — PROPOFOL 10 MG/ML IV BOLUS
INTRAVENOUS | Status: AC
  Filled 2018-11-29: qty 20

## 2018-11-29 MED ORDER — FENTANYL CITRATE (PF) 250 MCG/5ML IJ SOLN
INTRAMUSCULAR | Status: AC
  Filled 2018-11-29: qty 5

## 2018-11-29 MED ORDER — HYDROMORPHONE HCL 1 MG/ML IJ SOLN: 0.2500 mg | INTRAMUSCULAR | Status: DC | PRN

## 2018-11-29 MED ORDER — FLEET ENEMA 7-19 GM/118ML RE ENEM: 1.0000 | ENEMA | Freq: Once | RECTAL | Status: DC | PRN

## 2018-11-29 MED ORDER — HYDROCODONE-ACETAMINOPHEN 5-325 MG PO TABS: 1.0000 | ORAL_TABLET | ORAL | Status: DC | PRN

## 2018-11-29 MED ORDER — PROPOFOL 10 MG/ML IV BOLUS
INTRAVENOUS | Status: DC | PRN
  Administered 2018-11-29: 100 mg via INTRAVENOUS

## 2018-11-29 MED ORDER — ACETAMINOPHEN 500 MG PO TABS
500.0000 mg | ORAL_TABLET | Freq: Four times a day (QID) | ORAL | Status: DC
  Administered 2018-11-30 (×2): 500 mg via ORAL
  Filled 2018-11-29 (×4): qty 1

## 2018-11-29 MED ORDER — LACTATED RINGERS IV SOLN
INTRAVENOUS | Status: DC
  Administered 2018-11-29 – 2018-11-30 (×2): via INTRAVENOUS

## 2018-11-29 MED ORDER — ONDANSETRON HCL 4 MG PO TABS: 4.0000 mg | ORAL_TABLET | Freq: Four times a day (QID) | ORAL | Status: DC | PRN

## 2018-11-29 MED ORDER — METOCLOPRAMIDE HCL 5 MG/ML IJ SOLN: 5.0000 mg | Freq: Three times a day (TID) | INTRAMUSCULAR | Status: DC | PRN

## 2018-11-29 MED ORDER — ROCURONIUM BROMIDE 10 MG/ML (PF) SYRINGE
PREFILLED_SYRINGE | INTRAVENOUS | Status: DC | PRN
  Administered 2018-11-29: 60 mg via INTRAVENOUS

## 2018-11-29 MED ORDER — FENTANYL CITRATE (PF) 100 MCG/2ML IJ SOLN
INTRAMUSCULAR | Status: DC | PRN
  Administered 2018-11-29 (×2): 25 ug via INTRAVENOUS

## 2018-11-29 MED ORDER — MEPERIDINE HCL 25 MG/ML IJ SOLN: 6.2500 mg | INTRAMUSCULAR | Status: DC | PRN

## 2018-11-29 MED ORDER — ACETAMINOPHEN 160 MG/5ML PO SOLN: 325.0000 mg | ORAL | Status: DC | PRN

## 2018-11-29 SURGICAL SUPPLY — 36 items
CLOSURE STERI-STRIP 1/2X4 (GAUZE/BANDAGES/DRESSINGS) ×1
CLOSURE WOUND 1/2 X4 (GAUZE/BANDAGES/DRESSINGS) ×1
CLSR STERI-STRIP ANTIMIC 1/2X4 (GAUZE/BANDAGES/DRESSINGS) ×2 IMPLANT
COVER MAYO STAND STRL (DRAPES) ×3 IMPLANT
COVER PERINEAL POST (MISCELLANEOUS) ×3 IMPLANT
COVER SURGICAL LIGHT HANDLE (MISCELLANEOUS) ×3 IMPLANT
COVER WAND RF STERILE (DRAPES) ×3 IMPLANT
DRAPE STERI IOBAN 125X83 (DRAPES) ×3 IMPLANT
DRSG MEPILEX BORDER 4X4 (GAUZE/BANDAGES/DRESSINGS) ×7 IMPLANT
DURAPREP 26ML APPLICATOR (WOUND CARE) ×3 IMPLANT
ELECT REM PT RETURN 9FT ADLT (ELECTROSURGICAL) ×3
ELECTRODE REM PT RTRN 9FT ADLT (ELECTROSURGICAL) ×1 IMPLANT
GLOVE BIO SURGEON STRL SZ7.5 (GLOVE) ×6 IMPLANT
GLOVE BIOGEL PI IND STRL 8 (GLOVE) ×2 IMPLANT
GLOVE BIOGEL PI INDICATOR 8 (GLOVE) ×4
GOWN STRL REUS W/ TWL LRG LVL3 (GOWN DISPOSABLE) ×2 IMPLANT
GOWN STRL REUS W/TWL LRG LVL3 (GOWN DISPOSABLE) ×4
GUIDEROD T2 3X1000 (ROD) ×2 IMPLANT
K-WIRE  3.2X450M STR (WIRE) ×2
K-WIRE 3.2X450M STR (WIRE) ×1
KIT BASIN OR (CUSTOM PROCEDURE TRAY) ×3 IMPLANT
KIT NAIL LONG 10X380MMX125 (Nail) ×2 IMPLANT
KIT TURNOVER KIT B (KITS) ×3 IMPLANT
KWIRE 3.2X450M STR (WIRE) IMPLANT
MANIFOLD NEPTUNE II (INSTRUMENTS) ×1 IMPLANT
NS IRRIG 1000ML POUR BTL (IV SOLUTION) ×3 IMPLANT
PACK GENERAL/GYN (CUSTOM PROCEDURE TRAY) ×3 IMPLANT
PAD ARMBOARD 7.5X6 YLW CONV (MISCELLANEOUS) ×6 IMPLANT
SCREW LAG GAMMA 3 TI 10.5X100M (Screw) ×2 IMPLANT
STRIP CLOSURE SKIN 1/2X4 (GAUZE/BANDAGES/DRESSINGS) ×2 IMPLANT
SUT MNCRL AB 4-0 PS2 18 (SUTURE) ×3 IMPLANT
SUT VIC AB 2-0 CT1 27 (SUTURE) ×2
SUT VIC AB 2-0 CT1 TAPERPNT 27 (SUTURE) ×1 IMPLANT
TOWEL OR 17X26 10 PK STRL BLUE (TOWEL DISPOSABLE) ×3 IMPLANT
TOWEL OR NON WOVEN STRL DISP B (DISPOSABLE) ×3 IMPLANT
WATER STERILE IRR 1000ML POUR (IV SOLUTION) ×3 IMPLANT

## 2018-11-29 NOTE — Transfer of Care (Signed)
Immediate Anesthesia Transfer of Care Note  Patient: Kristy Mckinney  Procedure(s) Performed: INTRAMEDULLARY (IM) NAIL INTERTROCHANTRIC FRACTURE (Right Hip)  Patient Location: PACU  Anesthesia Type:General  Level of Consciousness: awake, alert  and patient cooperative  Airway & Oxygen Therapy: Patient Spontanous Breathing and Patient connected to face mask oxygen  Post-op Assessment: Report given to RN and Post -op Vital signs reviewed and stable  Post vital signs: Reviewed and stable  Last Vitals:  Vitals Value Taken Time  BP 92/56 11/29/2018  2:27 PM  Temp    Pulse 84 11/29/2018  2:28 PM  Resp 9 11/29/2018  2:29 PM  SpO2 95 % 11/29/2018  2:28 PM  Vitals shown include unvalidated device data.  Last Pain:  Vitals:   11/29/18 0900  TempSrc:   PainSc: Asleep      Patients Stated Pain Goal: 0 (04/11/48 6116)  Complications: No apparent anesthesia complications

## 2018-11-29 NOTE — Progress Notes (Signed)
Orthopedic Tech Progress Note Patient Details:  Kristy Mckinney May 16, 1932 [id number]  Ortho Devices Ortho Device/Splint Location: Trapeze bar Ortho Device/Splint Interventions: Application   Post Interventions Patient Tolerated: Well Instructions Provided: Care of device   [place] Pink 11/29/2018, 6:15 PM

## 2018-11-29 NOTE — Interval H&P Note (Signed)
I participated in the care of this patient and agree with the above history, physical and evaluation. I performed a review of the history and a physical exam as detailed   Anacaren Kohan Daniel Naithan Delage MD  

## 2018-11-29 NOTE — Anesthesia Procedure Notes (Signed)
Procedure Name: Intubation Date/Time: 11/29/2018 1:16 PM Performed by: Kyung Rudd, CRNA Pre-anesthesia Checklist: Patient identified, Emergency Drugs available, Suction available, Patient being monitored and Timeout performed Patient Re-evaluated:Patient Re-evaluated prior to induction Oxygen Delivery Method: Circle system utilized Preoxygenation: Pre-oxygenation with 100% oxygen Induction Type: IV induction Ventilation: Mask ventilation without difficulty Laryngoscope Size: Mac and 3 Grade View: Grade II Tube type: Oral Tube size: 7.0 mm Number of attempts: 1 Airway Equipment and Method: Stylet Placement Confirmation: ETT inserted through vocal cords under direct vision,  positive ETCO2 and breath sounds checked- equal and bilateral Secured at: 21 cm Tube secured with: Tape Dental Injury: Teeth and Oropharynx as per pre-operative assessment

## 2018-11-29 NOTE — Progress Notes (Signed)
PT Cancellation Note  Patient Details Name: ARFA LAMARCA MRN: [id number] DOB: 12-29-1931   Cancelled Treatment:    Reason Eval/Treat Not Completed: Medical issues which prohibited therapy(await IMN prior to eval)   Ayelen Sciortino B Javonna Balli 11/29/2018, 7:34 AM  Elwyn Reach, PT Acute Rehabilitation Services Pager: [phone] Office: [phone]

## 2018-11-29 NOTE — Progress Notes (Signed)
PROGRESS NOTE  [name] FWY:[id number] DOB: [birth year] DOA: 11/28/2018 PCP: Kristy Du, MD   LOS: 1 day   Brief narrative: Kristy Mckinney is a 85 y.o. female with medical history of chronic back pain who initially presented to any pain hospital after a fall at home which was thought to be mechanical in nature.  No syncope was reported.  Patient was recently discharged from the hospital after recently from 11/23/2020 11/27/2018 with a diagnosis of new onset atrial fibrillation and was started on Cardizem p.o. daily and Eliquis.  She did have a immediate pain in the hip and was unable to ambulate.  A x-ray was performed including a CT scan which showed left intertrochanteric hip fracture.  Patient was then transferred to [facility] and consult orthopedic surgery.A. fib she was found to have A-fib with rapid ventricular response and was  started on Cardizem drip..   Subjective: Today, patient denies any chest pain or palpitations, no dyspnea or shortness of breath.  He however complains of pain in the hip on moving.  Denies any nausea, vomiting or diarrhea.  Assessment/Plan:  Principal Problem:   Fracture, intertrochanteric, right femur ([place]) Active Problems:   Atrial fibrillation with RVR (HCC)   Anxiety and depression   Closed fracture of right femur, unspecified fracture morphology, initial encounter ([place])   Acute diastolic CHF (congestive heart failure) (HCC)  Closed fracture of the right hip-nondisplaced intertrochanteric fracture of femur.  Patient has been seen by orthopedics.  Plan for surgical intervention today.  Judiciously use  narcotics since patient was obtunded requiring Narcan after Dilaudid  Atrial fibrillation with RVR   Patient is n.p.o. for surgery.  Continue on Cardizem drip.  She was briefly on heparin drip which has been on hold for surgery.  Patient will need anticoagulation for stroke prevention after surgery.  Will need a physical therapy evaluation after.TSH  from 11/25/2018 at 1.6.  2D echocardiogram from 11/25/2018 shows left ventricular ejection fraction of 65%.  Acute diastolic congestive heart failure As per the admitting attending's assessment.  Currently stable.  Patient received 1 dose of IV Lasix.  2D echocardiogram from recent reviewed.  Consider Lasix if needed.  On room air.  Chronic back pain- On hydrocodone at home.   Anxiety / depression - cont Zoloft.  Hold Xanax  GERD.  Continue Protonix.  VTE Prophylaxis: Hold heparin  Code Status: Full code  Family Communication: None  Disposition Plan: Likely to rehab/skilled nursing facility   Consultants:  Orthopedics  Procedures:  Hip surgery planned for today  Antibiotics: Anti-infectives (From admission, onward)   Start     Dose/Rate Route Frequency Ordered Stop   11/29/18 1230  clindamycin (CLEOCIN) IVPB 900 mg     900 mg 100 mL/hr over 30 Minutes Intravenous On call to O.R. 11/28/18 1705 11/30/18 0559     Objective: Vitals:   11/29/18 0900 11/29/18 1000  BP: 139/61 (!) 142/53  Pulse: 94 98  Resp: (!) 21 (!) 25  Temp:    SpO2: 93% 98%    Intake/Output Summary (Last 24 hours) at 11/29/2018 1039 Last data filed at 11/29/2018 1000 Gross per 24 hour  Intake 289.78 ml  Output 450 ml  Net -160.22 ml   Filed Weights   11/28/18 0505 11/28/18 1708 11/29/18 0500  Weight: 66.7 kg 64.4 kg 65.9 kg   Body mass index is 24.18 kg/m.   Physical Exam: GENERAL: Patient is alert awake and communicative.. Not in obvious distress. HENT: No scleral pallor or  icterus. Pupils equally reactive to light. Oral mucosa is moist NECK: is supple, no palpable thyroid enlargement. CHEST: Clear to auscultation. Diminished breath sounds bilaterally. CVS: S1 and S2 heard, no murmur.  Irregularly irregular rhythm. No pericardial rub. ABDOMEN: Soft, non-tender, bowel sounds are present. No palpable hepato-splenomegaly. EXTREMITIES: No edema.  Right hip tenderness.  Distal pulses  palpable. CNS: Cranial nerves are intact. No focal motor or sensory deficits. SKIN: warm and dry without rashes.  Data Review: I have personally reviewed the following laboratory data and studies,  CBC: Recent Labs  Lab 11/24/18 1135 11/28/18 0607 11/29/18 0340  WBC 6.9 8.1 10.3  NEUTROABS 5.2 6.5  --   HGB 12.5 11.8* 10.9*  HCT 40.0 38.8 35.0*  MCV 85.3 85.8 84.3  PLT 256 226 371   Basic Metabolic Panel: Recent Labs  Lab 11/24/18 1135 11/25/18 0735 11/28/18 0607 11/29/18 0340  NA 142 141 138 139  K 3.7 3.4* 3.8 4.4  CL 103 104 101 99  CO2 25 26 26 27   GLUCOSE 102* 95 115* 109*  BUN 18 14 16 17   CREATININE 0.82 0.71 0.68 1.06*  CALCIUM 9.5 8.9 9.2 9.3  MG  --  1.5*  --   --    Liver Function Tests: Recent Labs  Lab 11/24/18 1135  AST 30  ALT 20  ALKPHOS 59  BILITOT 2.2*  PROT 7.1  ALBUMIN 4.1   No results for input(s): LIPASE, AMYLASE in the last 168 hours. No results for input(s): AMMONIA in the last 168 hours. Cardiac Enzymes: Recent Labs  Lab 11/24/18 1135  TROPONINI <0.03   BNP (last 3 results) No results for input(s): BNP in the last 8760 hours.  ProBNP (last 3 results) No results for input(s): PROBNP in the last 8760 hours.  CBG: No results for input(s): GLUCAP in the last 168 hours. Recent Results (from the past 240 hour(s))  SARS Coronavirus 2 (CEPHEID - Performed in [place] hospital lab), Hosp Order     Status: None   Collection Time: 11/24/18  4:13 PM  Result Value Ref Range Status   SARS Coronavirus 2 NEGATIVE NEGATIVE Final    Comment: (NOTE) If result is NEGATIVE SARS-CoV-2 target nucleic acids are NOT DETECTED. The SARS-CoV-2 RNA is generally detectable in upper and lower  respiratory specimens during the acute phase of infection. The lowest  concentration of SARS-CoV-2 viral copies this assay can detect is 250  copies / mL. A negative result does not preclude SARS-CoV-2 infection  and should not be used as the sole basis  for treatment or other  patient management decisions.  A negative result may occur with  improper specimen collection / handling, submission of specimen other  than nasopharyngeal swab, presence of viral mutation(s) within the  areas targeted by this assay, and inadequate number of viral copies  (<250 copies / mL). A negative result must be combined with clinical  observations, patient history, and epidemiological information. If result is POSITIVE SARS-CoV-2 target nucleic acids are DETECTED. The SARS-CoV-2 RNA is generally detectable in upper and lower  respiratory specimens dur ing the acute phase of infection.  Positive  results are indicative of active infection with SARS-CoV-2.  Clinical  correlation with patient history and other diagnostic information is  necessary to determine patient infection status.  Positive results do  not rule out bacterial infection or co-infection with other viruses. If result is PRESUMPTIVE POSTIVE SARS-CoV-2 nucleic acids MAY BE PRESENT.   A presumptive positive result was obtained  on the submitted specimen  and confirmed on repeat testing.  While 2019 novel coronavirus  (SARS-CoV-2) nucleic acids may be present in the submitted sample  additional confirmatory testing may be necessary for epidemiological  and / or clinical management purposes  to differentiate between  SARS-CoV-2 and other Sarbecovirus currently known to infect humans.  If clinically indicated additional testing with an alternate test  methodology [phone]) is advised. The SARS-CoV-2 RNA is generally  detectable in upper and lower respiratory sp ecimens during the acute  phase of infection. The expected result is Negative. Fact Sheet for Patients:  StrictlyIdeas.no Fact Sheet for Healthcare Providers: BankingDealers.co.za This test is not yet approved or cleared by the [place] FDA and has been authorized for detection and/or  diagnosis of SARS-CoV-2 by FDA under an Emergency Use Authorization (EUA).  This EUA will remain in effect (meaning this test can be used) for the duration of the COVID-19 declaration under Section 564(b)(1) of the Act, 21 U.S.C. section 360bbb-3(b)(1), unless the authorization is terminated or revoked sooner. Performed at [facility], [address], [place], [place] [address]   MRSA PCR Screening     Status: None   Collection Time: 11/24/18  6:21 PM  Result Value Ref Range Status   MRSA by PCR NEGATIVE NEGATIVE Final    Comment:        The GeneXpert MRSA Assay (FDA approved for NASAL specimens only), is one component of a comprehensive MRSA colonization surveillance program. It is not intended to diagnose MRSA infection nor to guide or monitor treatment for MRSA infections. Performed at [facility], [address] [place], [place] [address]   SARS Coronavirus 2 (CEPHEID - Performed in [facility] hospital lab), Hosp Order     Status: None   Collection Time: 11/28/18  6:04 AM  Result Value Ref Range Status   SARS Coronavirus 2 NEGATIVE NEGATIVE Final    Comment: (NOTE) If result is NEGATIVE SARS-CoV-2 target nucleic acids are NOT DETECTED. The SARS-CoV-2 RNA is generally detectable in upper and lower  respiratory specimens during the acute phase of infection. The lowest  concentration of SARS-CoV-2 viral copies this assay can detect is 250  copies / mL. A negative result does not preclude SARS-CoV-2 infection  and should not be used as the sole basis for treatment or other  patient management decisions.  A negative result may occur with  improper specimen collection / handling, submission of specimen other  than nasopharyngeal swab, presence of viral mutation(s) within the  areas targeted by this assay, and inadequate number of viral copies  (<250 copies / mL). A negative result must be combined with clinical  observations, patient history, and epidemiological  information. If result is POSITIVE SARS-CoV-2 target nucleic acids are DETECTED. The SARS-CoV-2 RNA is generally detectable in upper and lower  respiratory specimens dur ing the acute phase of infection.  Positive  results are indicative of active infection with SARS-CoV-2.  Clinical  correlation with patient history and other diagnostic information is  necessary to determine patient infection status.  Positive results do  not rule out bacterial infection or co-infection with other viruses. If result is PRESUMPTIVE POSTIVE SARS-CoV-2 nucleic acids MAY BE PRESENT.   A presumptive positive result was obtained on the submitted specimen  and confirmed on repeat testing.  While 2019 novel coronavirus  (SARS-CoV-2) nucleic acids may be present in the submitted sample  additional confirmatory testing may be necessary for epidemiological  and / or clinical management purposes  to differentiate between  SARS-CoV-2 and other Sarbecovirus currently known to infect humans.  If clinically indicated additional testing with an alternate test  methodology [phone]) is advised. The SARS-CoV-2 RNA is generally  detectable in upper and lower respiratory sp ecimens during the acute  phase of infection. The expected result is Negative. Fact Sheet for Patients:  StrictlyIdeas.no Fact Sheet for Healthcare Providers: BankingDealers.co.za This test is not yet approved or cleared by the [place] FDA and has been authorized for detection and/or diagnosis of SARS-CoV-2 by FDA under an Emergency Use Authorization (EUA).  This EUA will remain in effect (meaning this test can be used) for the duration of the COVID-19 declaration under Section 564(b)(1) of the Act, 21 U.S.C. section 360bbb-3(b)(1), unless the authorization is terminated or revoked sooner. Performed at [facility], [address] [place], [place] [address]   Surgical pcr screen     Status: None    Collection Time: 11/28/18  9:43 PM  Result Value Ref Range Status   MRSA, PCR NEGATIVE NEGATIVE Final   Staphylococcus aureus NEGATIVE NEGATIVE Final    Comment: (NOTE) The Xpert SA Assay (FDA approved for NASAL specimens in patients 32 years of age and older), is one component of a comprehensive surveillance program. It is not intended to diagnose infection nor to guide or monitor treatment. Performed at [facility], [place] [address] [place], [place] [address]      Studies: Dg Chest 1 View  Result Date: 11/28/2018 CLINICAL DATA:  Hip fracture EXAM: CHEST  1 VIEW COMPARISON:  11/24/2018 FINDINGS: Cardiomegaly. Mitral annular calcification. Small pleural effusions and diffuse interstitial opacity. No pneumothorax. No appreciable fracture. IMPRESSION: CHF pattern. Electronically Signed   By: Monte Fantasia M.D.   On: 11/28/2018 07:00   Ct Head Wo Contrast  Result Date: 11/28/2018 CLINICAL DATA:  Multiple falls EXAM: CT HEAD WITHOUT CONTRAST CT CERVICAL SPINE WITHOUT CONTRAST TECHNIQUE: Multidetector CT imaging of the head and cervical spine was performed following the standard protocol without intravenous contrast. Multiplanar CT image reconstructions of the cervical spine were also generated. COMPARISON:  11/25/2018 head CT FINDINGS: CT HEAD FINDINGS Brain: No evidence of acute infarction, hemorrhage, hydrocephalus, extra-axial collection or mass lesion/mass effect. Mild chronic small vessel ischemia and volume loss for age. Vascular: Atherosclerosis Skull: Negative for fracture Sinuses/Orbits: No evidence of injury.  Bilateral cataract resection CT CERVICAL SPINE FINDINGS Alignment: Normal Skull base and vertebrae: Negative for acute fracture. Soft tissues and spinal canal: No prevertebral fluid or swelling. No visible canal hematoma. Disc levels: Calcified disc herniation at C5-6 with size implying flattening of the cord. Disc narrowing and uncovertebral spurring causes foraminal  stenosis at C4-5 to C6-7. Upper chest: A right pleural effusion is minimally covered. This was also seen on recent chest x-ray 11/24/2018 IMPRESSION: 1. No evidence of intracranial injury or cervical spine fracture. 2. Cervical spine degeneration with notable calcified disc herniation at C5-6 that impinges on the cord. Electronically Signed   By: Monte Fantasia M.D.   On: 11/28/2018 06:53   Ct Cervical Spine Wo Contrast  Result Date: 11/28/2018 CLINICAL DATA:  Multiple falls EXAM: CT HEAD WITHOUT CONTRAST CT CERVICAL SPINE WITHOUT CONTRAST TECHNIQUE: Multidetector CT imaging of the head and cervical spine was performed following the standard protocol without intravenous contrast. Multiplanar CT image reconstructions of the cervical spine were also generated. COMPARISON:  11/25/2018 head CT FINDINGS: CT HEAD FINDINGS Brain: No evidence of acute infarction, hemorrhage, hydrocephalus, extra-axial collection or mass lesion/mass effect. Mild  chronic small vessel ischemia and volume loss for age. Vascular: Atherosclerosis Skull: Negative for fracture Sinuses/Orbits: No evidence of injury.  Bilateral cataract resection CT CERVICAL SPINE FINDINGS Alignment: Normal Skull base and vertebrae: Negative for acute fracture. Soft tissues and spinal canal: No prevertebral fluid or swelling. No visible canal hematoma. Disc levels: Calcified disc herniation at C5-6 with size implying flattening of the cord. Disc narrowing and uncovertebral spurring causes foraminal stenosis at C4-5 to C6-7. Upper chest: A right pleural effusion is minimally covered. This was also seen on recent chest x-ray 11/24/2018 IMPRESSION: 1. No evidence of intracranial injury or cervical spine fracture. 2. Cervical spine degeneration with notable calcified disc herniation at C5-6 that impinges on the cord. Electronically Signed   By: Monte Fantasia M.D.   On: 11/28/2018 06:53   Ct Hip Right Wo Contrast  Result Date: 11/28/2018 CLINICAL DATA:  Fall  with right hip pain.  Abnormal radiograph EXAM: CT OF THE RIGHT HIP WITHOUT CONTRAST TECHNIQUE: Multidetector CT imaging of the right hip was performed according to the standard protocol. Multiplanar CT image reconstructions were also generated. COMPARISON:  Radiography from earlier today FINDINGS: Oblique fracture through the intertrochanteric right femur without displacement. There is mild comminution at the greater trochanter. The hip is located. The visualized right hemipelvis is intact. Regional soft tissue edema and small joint effusion. Pelvic ascites which is trace where visualized and likely from volume overload given chest x-ray. IMPRESSION: Nondisplaced intertrochanteric right femur fracture. Electronically Signed   By: Monte Fantasia M.D.   On: 11/28/2018 06:59   Dg Hip Unilat W Or Wo Pelvis 2-3 Views Right  Result Date: 11/28/2018 CLINICAL DATA:  Recurrent falls.  Right hip pain EXAM: DG HIP (WITH OR WITHOUT PELVIS) 2-3V RIGHT COMPARISON:  12/16/2013 FINDINGS: Unexpected lucency obliquely across the proximal femur at the level of the intertrochanteric segment. This persists on the full pelvis view. No dislocation. IMPRESSION: Probable nondisplaced intertrochanteric femur fracture on the right. Would confirm with CT prior to any surgery. Electronically Signed   By: Monte Fantasia M.D.   On: 11/28/2018 05:59    Scheduled Meds:  calcium-vitamin D  1 tablet Oral BID   [START ON 11/30/2018] feeding supplement (ENSURE ENLIVE)  237 mL Oral BID BM   multivitamin with minerals  1 tablet Oral Daily   pantoprazole  40 mg Oral Daily   povidone-iodine  2 application Topical Once   sertraline  50 mg Oral q morning - 10a    Continuous Infusions:  clindamycin (CLEOCIN) IV     dilTIAZem HCl-Dextrose 15 mg/hr (11/29/18 0816)     Flora Lipps, MD  [facility] 11/29/2018

## 2018-11-29 NOTE — Anesthesia Preprocedure Evaluation (Signed)
Anesthesia Evaluation  Patient identified by MRN, date of birth, ID band Patient awake    Reviewed: Allergy & Precautions, H&P , NPO status , Patient's Chart, lab work & pertinent test results  Airway Mallampati: II  TM Distance: >3 FB Neck ROM: Full    Dental  (+) Teeth Intact   Pulmonary neg pulmonary ROS,    breath sounds clear to auscultation       Cardiovascular + dysrhythmias Atrial Fibrillation  Rhythm:Regular Rate:Normal  ECHO 5/20  The left ventricle has hyperdynamic systolic function, with an ejection fraction of >65%. The cavity size was normal. There is no increase in left ventricular wall thickness. Left ventricular diastolic Doppler parameters are indeterminate   Neuro/Psych PSYCHIATRIC DISORDERS Anxiety Depression    GI/Hepatic Neg liver ROS, GERD  Medicated and Controlled,  Endo/Other  negative endocrine ROS  Renal/GU negative Renal ROS     Musculoskeletal negative musculoskeletal ROS (+) chronic LBP   Abdominal   Peds  Hematology negative hematology ROS (+)   Anesthesia Other Findings   Reproductive/Obstetrics negative OB ROS                             Anesthesia Physical  Anesthesia Plan  ASA: III  Anesthesia Plan: General   Post-op Pain Management:    Induction: Intravenous  PONV Risk Score and Plan: 3 and Ondansetron and Treatment may vary due to age or medical condition  Airway Management Planned: Oral ETT  Additional Equipment:   Intra-op Plan:   Post-operative Plan: Extubation in OR  Informed Consent: I have reviewed the patients History and Physical, chart, labs and discussed the procedure including the risks, benefits and alternatives for the proposed anesthesia with the patient or authorized representative who has indicated his/her understanding and acceptance.       Plan Discussed with: CRNA, Anesthesiologist and Surgeon  Anesthesia Plan  Comments:         Anesthesia Quick Evaluation

## 2018-11-29 NOTE — Op Note (Signed)
DATE OF SURGERY:  11/29/2018  TIME: 1:17 PM  PATIENT NAME:  [name]  AGE: 85 y.o.  PRE-OPERATIVE DIAGNOSIS:  Right hip fracture  POST-OPERATIVE DIAGNOSIS:  SAME  PROCEDURE:  INTRAMEDULLARY (IM) NAIL INTERTROCHANTRIC FRACTURE  SURGEON:  Renette Butters  ASSISTANT:  Roxan Hockey, PA-C, he was present and scrubbed throughout the case, critical for completion in a timely fashion, and for retraction, instrumentation, and closure.   OPERATIVE IMPLANTS: [employer]ker Gamma Nail  PREOPERATIVE INDICATIONS:  Kristy Mckinney is a 85 y.o. year old who fell and suffered a hip fracture. She was brought into the ER and then admitted and optimized and then elected for surgical intervention.    The risks benefits and alternatives were discussed with the patient including but not limited to the risks of nonoperative treatment, versus surgical intervention including infection, bleeding, nerve injury, malunion, nonunion, hardware prominence, hardware failure, need for hardware removal, blood clots, cardiopulmonary complications, morbidity, mortality, among others, and they were willing to proceed.    OPERATIVE PROCEDURE:  The patient was brought to the operating room and placed in the supine position. General anesthesia was administered. She was placed on the fracture table.  Closed reduction was performed under C-arm guidance. Time out was then performed after sterile prep and drape. She received preoperative antibiotics.  Incision was made proximal to the greater trochanter. A guidewire was placed in the appropriate position. Confirmation was made on AP and lateral views. The above-named nail was opened. I opened the proximal femur with a reamer. I then placed the nail by hand easily down. I did not need to ream the femur.  Once the nail was completely seated, I placed a guidepin into the femoral head into the center center position. I measured the length, and then reamed the lateral cortex and  up into the head. I then placed the lag screw. Slight compression was applied. Anatomic fixation achieved. Bone quality was mediocre.  I then secured the proximal interlocking bolt, and took off a half a turn, and then removed the instruments, and took final C-arm pictures AP and lateral the entire length of the leg.   Anatomic reconstruction was achieved, and the wounds were irrigated copiously and closed with Vicryl followed by staples and sterile gauze for the skin. The patient was awakened and returned to PACU in stable and satisfactory condition. There no complications and the patient tolerated the procedure well.  She will be weightbearing as tolerated, and will be on chemical px  for a period of four weeks after discharge.   Edmonia Lynch, M.D.

## 2018-11-30 LAB — BASIC METABOLIC PANEL
Anion gap: 10 (ref 5–15)
BUN: 26 mg/dL — ABNORMAL HIGH (ref 8–23)
CO2: 27 mmol/L (ref 22–32)
Calcium: 9.3 mg/dL (ref 8.9–10.3)
Chloride: 98 mmol/L (ref 98–111)
Creatinine, Ser: 1.66 mg/dL — ABNORMAL HIGH (ref 0.44–1.00)
GFR calc Af Amer: 32 mL/min — ABNORMAL LOW (ref >60)
GFR calc non Af Amer: 28 mL/min — ABNORMAL LOW (ref >60)
Glucose, Bld: 149 mg/dL — ABNORMAL HIGH (ref 70–99)
Potassium: 4.7 mmol/L (ref 3.5–5.1)
Sodium: 135 mmol/L (ref 135–145)

## 2018-11-30 LAB — CBC
HCT: 34.9 % — ABNORMAL LOW (ref 36.0–46.0)
Hemoglobin: 10.6 g/dL — ABNORMAL LOW (ref 12.0–15.0)
MCH: 26 pg (ref 26.0–34.0)
MCHC: 30.4 g/dL (ref 30.0–36.0)
MCV: 85.5 fL (ref 80.0–100.0)
Platelets: 192 10*3/uL (ref 150–400)
RBC: 4.08 MIL/uL (ref 3.87–5.11)
RDW: 15.7 % — ABNORMAL HIGH (ref 11.5–15.5)
WBC: 9 10*3/uL (ref 4.0–10.5)
nRBC: 0 % (ref 0.0–0.2)

## 2018-11-30 LAB — MAGNESIUM: Magnesium: 1.6 mg/dL — ABNORMAL LOW (ref 1.7–2.4)

## 2018-11-30 MED ORDER — MAGNESIUM OXIDE 400 (241.3 MG) MG PO TABS
400.0000 mg | ORAL_TABLET | Freq: Two times a day (BID) | ORAL | Status: DC
  Administered 2018-11-30 – 2018-12-05 (×9): 400 mg via ORAL
  Filled 2018-11-30 (×10): qty 1

## 2018-11-30 MED ORDER — MAGNESIUM SULFATE 2 GM/50ML IV SOLN
2.0000 g | Freq: Once | INTRAVENOUS | Status: AC
  Administered 2018-11-30: 2 g via INTRAVENOUS
  Filled 2018-11-30: qty 50

## 2018-11-30 MED ORDER — TRAZODONE HCL 50 MG PO TABS
50.0000 mg | ORAL_TABLET | Freq: Once | ORAL | Status: AC
  Administered 2018-11-30: 50 mg via ORAL
  Filled 2018-11-30: qty 1

## 2018-11-30 NOTE — Social Work (Signed)
CSW acknowledging consult for SNF placement. Currently recommendation is for CIR, will leave hand off for CSW to f/u with pt as needed.   Westley Hummer, MSW, [place] Work [phone]

## 2018-11-30 NOTE — Progress Notes (Addendum)
PROGRESS NOTE  Kristy Mckinney XAJ:[id number] DOB: 06-19-1932 DOA: 11/28/2018 PCP: [name], MD   LOS: 2 days   Brief narrative: Kristy Mckinney is a 85 y.o. female with medical history of chronic back pain who initially presented to any pain hospital after a fall at home which was thought to be mechanical in nature.  No syncope was reported.  Patient was recently discharged from the hospital after recently from 11/23/2020 11/27/2018 with a diagnosis of new onset atrial fibrillation and was started on Cardizem p.o. daily and Eliquis.  She did have a immediate pain in the hip and was unable to ambulate.  A x-ray was performed including a CT scan which showed left intertrochanteric hip fracture.  Patient was then transferred to [facility] and consult orthopedic surgery.A. fib she was found to have A-fib with rapid ventricular response and was  started on Cardizem drip..   Subjective: Today, Patient feels okay.  Denies overt pain.  Denies chest pain, shortness of breath fever or chills.  Assessment/Plan:  Principal Problem:   Fracture, intertrochanteric, right femur ([place]) Active Problems:   Atrial fibrillation with RVR (HCC)   Anxiety and depression   Closed fracture of right femur, unspecified fracture morphology, initial encounter ([place])   Acute diastolic CHF (congestive heart failure) (HCC)  Closed fracture of the right hip-nondisplaced intertrochanteric fracture of femur.  Status post surgical intervention.  Management as per orthopedic team.  Orthopedics recommended weightbearing as tolerated.  Atrial fibrillation with RVR on presentation. Patient was briefly on Cardizem drip.  This has been changed to oral Cardizem.  Anticoagulation has been initiated at this time.  Continue physical therapy.  TSH from 11/25/2018 at 1.6.  2D echocardiogram from 11/25/2018 shows left ventricular ejection fraction of 65%.  Acute diastolic congestive heart failure As per the admitting attending's  assessment.  Currently stable.  Patient received 1 dose of IV Lasix.  2D echocardiogram from recent reviewed.  Consider Lasix if needed.  On room air currently.  Chronic back pain- On hydrocodone at home.   Anxiety / depression - cont Zoloft.  Hold Xanax  GERD.  Continue Protonix.  VTE Prophylaxis: Eliquis has been initiated.  Code Status: Full code  Family Communication: I spoke with the patient's daughter on the phone and updated her about the clinical condition of the patient.  Disposition Plan: Physical therapy recommended CIR.  Will consult CIR.   Consultants:  Orthopedics  Procedures:  Intramedullary nail placement by Dr. Edmonia Lynch on 11/29/2018  Antibiotics: Anti-infectives (From admission, onward)   Start     Dose/Rate Route Frequency Ordered Stop   11/29/18 1600  clindamycin (CLEOCIN) IVPB 600 mg     600 mg 100 mL/hr over 30 Minutes Intravenous Every 6 hours 11/29/18 1547 11/29/18 2234   11/29/18 1230  clindamycin (CLEOCIN) IVPB 900 mg     900 mg 100 mL/hr over 30 Minutes Intravenous On call to O.R. 11/28/18 1705 11/29/18 1348     Objective: Vitals:   11/30/18 0442 11/30/18 0542  BP: 132/75 (!) 142/102  Pulse: 98 88  Resp: 20 20  Temp:    SpO2: 100% 100%    Intake/Output Summary (Last 24 hours) at 11/30/2018 1105 Last data filed at 11/30/2018 0600 Gross per 24 hour  Intake 1125.71 ml  Output 320 ml  Net 805.71 ml   Filed Weights   11/28/18 1708 11/29/18 0500 11/30/18 0418  Weight: 64.4 kg 65.9 kg 69.1 kg   Body mass index is 25.35 kg/m.  Physical Exam: GENERAL: Patient is alert awake and communicative.. Not in obvious distress. HENT: No scleral pallor or icterus. Pupils equally reactive to light. Oral mucosa is moist NECK: is supple, no palpable thyroid enlargement. CHEST: Clear to auscultation. Diminished breath sounds bilaterally. CVS: S1 and S2 heard, no murmur.  Irregularly irregular rhythm. No pericardial rub. ABDOMEN: Soft,  non-tender, bowel sounds are present. No palpable hepato-splenomegaly. EXTREMITIES: No edema.  Right hip dressing clean dry and intact..  Distal pulses palpable. CNS: Cranial nerves are intact. No focal motor or sensory deficits. SKIN: warm and dry without rashes.  Data Review: I have personally reviewed the following laboratory data and studies,  CBC: Recent Labs  Lab 11/24/18 1135 11/28/18 0607 11/29/18 0340 11/30/18 0410  WBC 6.9 8.1 10.3 9.0  NEUTROABS 5.2 6.5  --   --   HGB 12.5 11.8* 10.9* 10.6*  HCT 40.0 38.8 35.0* 34.9*  MCV 85.3 85.8 84.3 85.5  PLT 256 226 241 106   Basic Metabolic Panel: Recent Labs  Lab 11/24/18 1135 11/25/18 0735 11/28/18 0607 11/29/18 0340 11/30/18 0410  NA 142 141 138 139 135  K 3.7 3.4* 3.8 4.4 4.7  CL 103 104 101 99 98  CO2 25 26 26 27 27   GLUCOSE 102* 95 115* 109* 149*  BUN 18 14 16 17  26*  CREATININE 0.82 0.71 0.68 1.06* 1.66*  CALCIUM 9.5 8.9 9.2 9.3 9.3  MG  --  1.5*  --   --  1.6*   Liver Function Tests: Recent Labs  Lab 11/24/18 1135  AST 30  ALT 20  ALKPHOS 59  BILITOT 2.2*  PROT 7.1  ALBUMIN 4.1   No results for input(s): LIPASE, AMYLASE in the last 168 hours. No results for input(s): AMMONIA in the last 168 hours. Cardiac Enzymes: Recent Labs  Lab 11/24/18 1135  TROPONINI <0.03   BNP (last 3 results) No results for input(s): BNP in the last 8760 hours.  ProBNP (last 3 results) No results for input(s): PROBNP in the last 8760 hours.  CBG: No results for input(s): GLUCAP in the last 168 hours. Recent Results (from the past 240 hour(s))  SARS Coronavirus 2 (CEPHEID - Performed in [place] hospital lab), Hosp Order     Status: None   Collection Time: 11/24/18  4:13 PM  Result Value Ref Range Status   SARS Coronavirus 2 NEGATIVE NEGATIVE Final    Comment: (NOTE) If result is NEGATIVE SARS-CoV-2 target nucleic acids are NOT DETECTED. The SARS-CoV-2 RNA is generally detectable in upper and lower   respiratory specimens during the acute phase of infection. The lowest  concentration of SARS-CoV-2 viral copies this assay can detect is 250  copies / mL. A negative result does not preclude SARS-CoV-2 infection  and should not be used as the sole basis for treatment or other  patient management decisions.  A negative result may occur with  improper specimen collection / handling, submission of specimen other  than nasopharyngeal swab, presence of viral mutation(s) within the  areas targeted by this assay, and inadequate number of viral copies  (<250 copies / mL). A negative result must be combined with clinical  observations, patient history, and epidemiological information. If result is POSITIVE SARS-CoV-2 target nucleic acids are DETECTED. The SARS-CoV-2 RNA is generally detectable in upper and lower  respiratory specimens dur ing the acute phase of infection.  Positive  results are indicative of active infection with SARS-CoV-2.  Clinical  correlation with patient history and other diagnostic  information is  necessary to determine patient infection status.  Positive results do  not rule out bacterial infection or co-infection with other viruses. If result is PRESUMPTIVE POSTIVE SARS-CoV-2 nucleic acids MAY BE PRESENT.   A presumptive positive result was obtained on the submitted specimen  and confirmed on repeat testing.  While 2019 novel coronavirus  (SARS-CoV-2) nucleic acids may be present in the submitted sample  additional confirmatory testing may be necessary for epidemiological  and / or clinical management purposes  to differentiate between  SARS-CoV-2 and other Sarbecovirus currently known to infect humans.  If clinically indicated additional testing with an alternate test  methodology [phone]) is advised. The SARS-CoV-2 RNA is generally  detectable in upper and lower respiratory sp ecimens during the acute  phase of infection. The expected result is Negative. Fact  Sheet for Patients:  StrictlyIdeas.no Fact Sheet for Healthcare Providers: BankingDealers.co.za This test is not yet approved or cleared by the [place] FDA and has been authorized for detection and/or diagnosis of SARS-CoV-2 by FDA under an Emergency Use Authorization (EUA).  This EUA will remain in effect (meaning this test can be used) for the duration of the COVID-19 declaration under Section 564(b)(1) of the Act, 21 U.S.C. section 360bbb-3(b)(1), unless the authorization is terminated or revoked sooner. Performed at [facility], [address] [place], [place] [address]   MRSA PCR Screening     Status: None   Collection Time: 11/24/18  6:21 PM  Result Value Ref Range Status   MRSA by PCR NEGATIVE NEGATIVE Final    Comment:        The GeneXpert MRSA Assay (FDA approved for NASAL specimens only), is one component of a comprehensive MRSA colonization surveillance program. It is not intended to diagnose MRSA infection nor to guide or monitor treatment for MRSA infections. Performed at [facility], [address], [place], [place] [address]   SARS Coronavirus 2 (CEPHEID - Performed in [facility] hospital lab), Hosp Order     Status: None   Collection Time: 11/28/18  6:04 AM  Result Value Ref Range Status   SARS Coronavirus 2 NEGATIVE NEGATIVE Final    Comment: (NOTE) If result is NEGATIVE SARS-CoV-2 target nucleic acids are NOT DETECTED. The SARS-CoV-2 RNA is generally detectable in upper and lower  respiratory specimens during the acute phase of infection. The lowest  concentration of SARS-CoV-2 viral copies this assay can detect is 250  copies / mL. A negative result does not preclude SARS-CoV-2 infection  and should not be used as the sole basis for treatment or other  patient management decisions.  A negative result may occur with  improper specimen collection / handling, submission of specimen other  than  nasopharyngeal swab, presence of viral mutation(s) within the  areas targeted by this assay, and inadequate number of viral copies  (<250 copies / mL). A negative result must be combined with clinical  observations, patient history, and epidemiological information. If result is POSITIVE SARS-CoV-2 target nucleic acids are DETECTED. The SARS-CoV-2 RNA is generally detectable in upper and lower  respiratory specimens dur ing the acute phase of infection.  Positive  results are indicative of active infection with SARS-CoV-2.  Clinical  correlation with patient history and other diagnostic information is  necessary to determine patient infection status.  Positive results do  not rule out bacterial infection or co-infection with other viruses. If result is PRESUMPTIVE POSTIVE SARS-CoV-2 nucleic acids MAY BE PRESENT.   A presumptive positive result was  obtained on the submitted specimen  and confirmed on repeat testing.  While 2019 novel coronavirus  (SARS-CoV-2) nucleic acids may be present in the submitted sample  additional confirmatory testing may be necessary for epidemiological  and / or clinical management purposes  to differentiate between  SARS-CoV-2 and other Sarbecovirus currently known to infect humans.  If clinically indicated additional testing with an alternate test  methodology [id number]) is advised. The SARS-CoV-2 RNA is generally  detectable in upper and lower respiratory sp ecimens during the acute  phase of infection. The expected result is Negative. Fact Sheet for Patients:  StrictlyIdeas.no Fact Sheet for Healthcare Providers: BankingDealers.co.za This test is not yet approved or cleared by the [place] FDA and has been authorized for detection and/or diagnosis of SARS-CoV-2 by FDA under an Emergency Use Authorization (EUA).  This EUA will remain in effect (meaning this test can be used) for the duration of the  COVID-19 declaration under Section 564(b)(1) of the Act, 21 U.S.C. section 360bbb-3(b)(1), unless the authorization is terminated or revoked sooner. Performed at [facility], [address], [place], [place] [address]   Surgical pcr screen     Status: None   Collection Time: 11/28/18  9:43 PM  Result Value Ref Range Status   MRSA, PCR NEGATIVE NEGATIVE Final   Staphylococcus aureus NEGATIVE NEGATIVE Final    Comment: (NOTE) The Xpert SA Assay (FDA approved for NASAL specimens in patients 22 years of age and older), is one component of a comprehensive surveillance program. It is not intended to diagnose infection nor to guide or monitor treatment. Performed at [facility], [place] [address] [place], [place] [address]      Studies: Dg C-arm 1-60 Min  Result Date: 11/29/2018 CLINICAL DATA:  ORIF of the right hip EXAM: DG C-ARM 61-120 MIN COMPARISON:  11/28/2018 FINDINGS: Three images were obtained of the right femur during intramedullary nail placement. A total of 20 seconds of fluoroscopy time was utilized for this exam. The alignment appears improved. Again is an intratrochanteric fracture of the proximal right femur. IMPRESSION: Status post ORIF of the right femur. Electronically Signed   By: Constance Holster M.D.   On: 11/29/2018 16:00   Dg Femur, Min 2 Views Right  Result Date: 11/29/2018 CLINICAL DATA:  Right hip ORIF EXAM: RIGHT FEMUR 2 VIEWS COMPARISON:  11/28/2018 FINDINGS: The patient has undergone placement of a intramedullary nail through the proximal right femur. The alignment appears improved. There is no unexpected postoperative finding. IMPRESSION: Status post ORIF of the right hip. Electronically Signed   By: Constance Holster M.D.   On: 11/29/2018 16:00    Scheduled Meds: . acetaminophen  500 mg Oral Q6H  . apixaban  2.5 mg Oral BID  . calcium-vitamin D  1 tablet Oral BID  . diltiazem  60 mg Oral Q6H  . docusate sodium  100 mg Oral BID  . feeding supplement  (ENSURE ENLIVE)  237 mL Oral BID BM  . magnesium oxide  400 mg Oral BID  . multivitamin with minerals  1 tablet Oral Daily  . pantoprazole  40 mg Oral Daily  . sertraline  50 mg Oral q morning - 10a    Continuous Infusions: . lactated ringers 50 mL/hr at 11/30/18 0600  . magnesium sulfate bolus IVPB 2 g (11/30/18 1033)     Flora Lipps, MD  [facility] 11/30/2018

## 2018-11-30 NOTE — Progress Notes (Signed)
Rehab Admissions Coordinator Note:  Patient was screened by Cleatrice Burke for appropriateness for an Inpatient Acute Rehab Consult per PT recs.  At this time, we are recommending Inpatient Rehab consult. Please place order for consult.  Jade, Burkard RN MSN 11/30/2018, 9:38 AM  I can be reached at [phone].

## 2018-11-30 NOTE — Progress Notes (Signed)
Bladder scan preformed by NT Rayburn Ma, 60cc noted at 1630.  Patient place on bedpan with no urine/voiding noted.  Will continue to monitor.

## 2018-11-30 NOTE — Anesthesia Postprocedure Evaluation (Signed)
Anesthesia Post Note  Patient: Kristy Mckinney  Procedure(s) Performed: INTRAMEDULLARY (IM) NAIL INTERTROCHANTRIC FRACTURE (Right Hip)     Patient location during evaluation: PACU Anesthesia Type: General Level of consciousness: awake and alert Pain management: pain level controlled Vital Signs Assessment: post-procedure vital signs reviewed and stable Respiratory status: spontaneous breathing, nonlabored ventilation, respiratory function stable and patient connected to nasal cannula oxygen Cardiovascular status: blood pressure returned to baseline and stable Postop Assessment: no apparent nausea or vomiting Anesthetic complications: no    Last Vitals:  Vitals:   11/30/18 1423 11/30/18 1426  BP:    Pulse: (!) 103 (!) 45  Resp:    Temp:    SpO2: (!) 82% 97%    Last Pain:  Vitals:   11/30/18 1145  TempSrc: Oral  PainSc:                  Cray Monnin

## 2018-11-30 NOTE — Progress Notes (Signed)
    Subjective: Patient denies pain.    Objective:   VITALS:   Vitals:   11/30/18 0342 11/30/18 0418 11/30/18 0442 11/30/18 0542  BP: 117/76 116/75 132/75 (!) 142/102  Pulse: 87 82 98 88  Resp: (!) 26 14 20 20   Temp:  98.1 F (36.7 C)    TempSrc:  Oral    SpO2: 98% 100% 100% 100%  Weight:  69.1 kg    Height:       CBC Latest Ref Rng & Units 11/30/2018 11/29/2018 11/28/2018  WBC 4.0 - 10.5 K/uL 9.0 10.3 8.1  Hemoglobin 12.0 - 15.0 g/dL 10.6(L) 10.9(L) 11.8(L)  Hematocrit 36.0 - 46.0 % 34.9(L) 35.0(L) 38.8  Platelets 150 - 400 K/uL 192 241 226   BMP Latest Ref Rng & Units 11/30/2018 11/29/2018 11/28/2018  Glucose 70 - 99 mg/dL 149(H) 109(H) 115(H)  BUN 8 - 23 mg/dL 26(H) 17 16  Creatinine 0.44 - 1.00 mg/dL 1.66(H) 1.06(H) 0.68  Sodium 135 - 145 mmol/L 135 139 138  Potassium 3.5 - 5.1 mmol/L 4.7 4.4 3.8  Chloride 98 - 111 mmol/L 98 99 101  CO2 22 - 32 mmol/L 27 27 26   Calcium 8.9 - 10.3 mg/dL 9.3 9.3 9.2   Intake/Output      05/22 0701 - 05/23 0700 05/23 0701 - 05/24 0700   I.V. (mL/kg) 1168.2 (16.9)    Total Intake(mL/kg) 1168.2 (16.9)    Urine (mL/kg/hr) 220 (0.1)    Blood 100    Total Output 320    Net +848.2            Physical Exam: General: NAD.  Upright in bed.  Responds to yes/no questions and commands.  No increased work of breathing.  MSK RLE: Dressings clean dry and intact.  Feet warm.  EHL, FHL, dorsiflexion, plantarflexion intact distally.  Sensation intact distally  Assessment: 1 Day Post-Op  S/P Procedure(s) (LRB): INTRAMEDULLARY (IM) NAIL INTERTROCHANTRIC FRACTURE (Right) by Dr. Ernesta Amble. Percell Miller on 11/29/2018  Principal Problem:   Fracture, intertrochanteric, right femur ([place]) Active Problems:   Atrial fibrillation with RVR (HCC)   Anxiety and depression   Closed fracture of right femur, unspecified fracture morphology, initial encounter ([place])   Acute diastolic CHF (congestive heart failure) (HCC)   Right intertrochanteric femur fracture,  status post cephalo-medullary IM nail Doing well postop day 1 Pain controlled Early mobilization with therapy  Plan: Up with therapy Incentive Spirometry Apply ice PRN  Weightbearing: WBAT RLE Insicional and dressing care: Dressings left intact until follow-up Showering: Keep dressing dry VTE prophylaxis: Resume Eliquis for A. fib, SCDs, ambulation Pain control: Minimize narcotics.  Tylenol.  Norco for severe pain. Follow - up plan: 2 weeks in the office with Dr. Percell Miller.  Dispo: TBD.  Therapy evaluations ongoing- CIR recommended PT.  Discharge when ready medically.   Kristy Burly III, PA-C 11/30/2018, 9:51 AM

## 2018-11-30 NOTE — Evaluation (Signed)
Physical Therapy Evaluation Patient Details Name: Kristy Mckinney MRN: [id number] DOB: 1932/02/25 Today's Date: 11/30/2018   History of Present Illness  85 yo recently admitted to [facility] 5/17-5/20 with afib. [facility] 5/21 with resultant left hip fx s/p IM nail 5/22. PMhx: Afib, anxiety/depression, CHf, chronic back pain  Clinical Impression  PT pleasant with noted confusion this session with decreased strength, ROM, transfers, gait and function impaired by pain and AMS. Pt not oriented to day with delayed processing and safety awareness. Pt will benefit from acute therapy to maximize mobility, function and strength. Pt also with very recent hospitalization for Afib and normally very independent. Pt would benefit from CIR to maximize independence and safety for return home.   HR 103-113 SPO2 92-94% RA    Follow Up Recommendations CIR;Supervision/Assistance - 24 hour    Equipment Recommendations  None recommended by PT    Recommendations for Other Services OT consult;Rehab consult     Precautions / Restrictions Precautions Precautions: Fall      Mobility  Bed Mobility Overal bed mobility: Needs Assistance Bed Mobility: Supine to Sit     Supine to sit: Min assist;HOB elevated     General bed mobility comments: min assist to move RLE, cues for sequence with use of rail and HOB 45 degrees with increased time  Transfers Overall transfer level: Needs assistance   Transfers: Sit to/from Stand;Stand Pivot Transfers Sit to Stand: Min assist         General transfer comment: cues for hand placement with assist to rise from surface, increased time to transition hands from surface to RW. MIn assist to perform short steps grossly 1' to transfer bed to chair toward left with rW  Ambulation/Gait                Stairs            Wheelchair Mobility    Modified Rankin (Stroke Patients Only)       Balance Overall balance assessment: Needs assistance;History of  Falls Sitting-balance support: Feet supported;No upper extremity supported Sitting balance-Leahy Scale: Fair     Standing balance support: During functional activity;Bilateral upper extremity supported Standing balance-Leahy Scale: Poor                               Pertinent Vitals/Pain Pain Assessment: 0-10 Pain Score: 5  Pain Location: Rt hip with movement Pain Descriptors / Indicators: Aching;Sore Pain Intervention(s): Limited activity within patient's tolerance;Monitored during session;Repositioned    Home Living Family/patient expects to be discharged to:: Private residence Living Arrangements: Alone Available Help at Discharge: Family;Available PRN/intermittently Type of Home: Apartment Home Access: Level entry     Home Layout: One level Home Equipment: Cane - single point;Shower seat;Bedside commode;Walker - 2 wheels      Prior Function Level of Independence: Independent with assistive device(s)         Comments: uses cane in community, occasionally drives, enjoys crochet and cards     Hand Dominance   Dominant Hand: Right    Extremity/Trunk Assessment   Upper Extremity Assessment Upper Extremity Assessment: Overall WFL for tasks assessed    Lower Extremity Assessment Lower Extremity Assessment: RLE deficits/detail RLE Deficits / Details: decreased ROM and strenght limited by post op pain    Cervical / Trunk Assessment Cervical / Trunk Assessment: Normal  Communication   Communication: No difficulties  Cognition Arousal/Alertness: Awake/alert;Suspect due to medications Behavior During Therapy: [facility] for  tasks assessed/performed Overall Cognitive Status: Impaired/Different from baseline Area of Impairment: Orientation;Memory;Problem solving;Following commands                 Orientation Level: Disoriented to;Time   Memory: Decreased short-term memory Following Commands: Follows one step commands with increased time      Problem Solving: Slow processing;Decreased initiation General Comments: pt unable to recall date, slow processing, possibly due to medication      General Comments      Exercises General Exercises - Lower Extremity Long Arc Quad: AROM;10 reps;Seated;Right Heel Slides: AAROM;10 reps;Supine;Right Hip ABduction/ADduction: AAROM;10 reps;Supine;Right   Assessment/Plan    PT Assessment Patient needs continued PT services  PT Problem List Decreased strength;Decreased balance;Decreased cognition;Pain;Decreased range of motion;Decreased mobility;Decreased knowledge of use of DME;Decreased activity tolerance       PT Treatment Interventions DME instruction;Functional mobility training;Balance training;Patient/family education;Gait training;Therapeutic activities;Therapeutic exercise;Cognitive remediation    PT Goals (Current goals can be found in the Care Plan section)  Acute Rehab PT Goals Patient Stated Goal: return home and crochet PT Goal Formulation: With patient Time For Goal Achievement: 12/14/18 Potential to Achieve Goals: Good    Frequency Min 5X/week   Barriers to discharge Decreased caregiver support daughters are retired and can assist but unsure of 24hr    Co-evaluation               AM-PAC PT "6 Clicks" Mobility  Outcome Measure Help needed turning from your back to your side while in a flat bed without using bedrails?: A Little Help needed moving from lying on your back to sitting on the side of a flat bed without using bedrails?: A Little Help needed moving to and from a bed to a chair (including a wheelchair)?: A Little Help needed standing up from a chair using your arms (e.g., wheelchair or bedside chair)?: A Little Help needed to walk in hospital room?: A Lot Help needed climbing 3-5 steps with a railing? : Total 6 Click Score: 15    End of Session Equipment Utilized During Treatment: Gait belt Activity Tolerance: Patient tolerated treatment  well Patient left: in chair;with call bell/phone within reach;with chair alarm set Nurse Communication: Mobility status;Precautions PT Visit Diagnosis: Other abnormalities of gait and mobility (R26.89);Muscle weakness (generalized) (M62.81);Difficulty in walking, not elsewhere classified (R26.2)    Time: 8270-7867 PT Time Calculation (min) (ACUTE ONLY): 22 min   Charges:   PT Evaluation $PT Eval Moderate Complexity: 1 Mod          [place], PT Acute Rehabilitation Services Pager: [phone] Office: [phone]   Mackenzy Grumbine B Jerrald Doverspike 11/30/2018, 9:22 AM

## 2018-12-01 DIAGNOSIS — R410 Disorientation, unspecified: Secondary | ICD-10-CM

## 2018-12-01 MED ORDER — LACTATED RINGERS IV SOLN: INTRAVENOUS | Status: DC

## 2018-12-01 MED ORDER — HYDROCODONE-ACETAMINOPHEN 5-325 MG PO TABS
1.0000 | ORAL_TABLET | Freq: Four times a day (QID) | ORAL | Status: DC | PRN
  Administered 2018-12-01 – 2018-12-05 (×5): 1 via ORAL
  Filled 2018-12-01 (×6): qty 1

## 2018-12-01 NOTE — Evaluation (Signed)
Occupational Therapy Evaluation Patient Details Name: Kristy Mckinney MRN: [id number] DOB: May 04, 1932 Today's Date: 12/01/2018    History of Present Illness 85 yo recently admitted to [facility] 5/17-5/20 with afib. [facility] 5/21 with resultant left hip fx s/p IM nail 5/22. PMhx: Afib, anxiety/depression, CHf, chronic back pain   Clinical Impression   Pt PTA: living alone, independent with family supportive. Pt reports independence with ADL and mobility prior to fall. Pt currently limited by pain in RLE, decreased strength and decreased activity tolerance. Pt performing UB ADL with minA and maxa for LB ADL overall. Pt has fair motivation during this session and kept saying "I'm not doing anything." Pt minA overall for transfers able to take a few steps from EOB to recliner. HR increasing to 130 BPM post exertion. O2  >90%. Pt would benefit from continued OT skilled services for ADL retraining, energy conservation and safety in CIR setting. OT to follow acutely.     Follow Up Recommendations  CIR;Supervision/Assistance - 24 hour    Equipment Recommendations  Other (comment)(to be determined at next venue)    Recommendations for Other Services       Precautions / Restrictions Precautions Precautions: Fall Restrictions Weight Bearing Restrictions: Yes RUE Weight Bearing: Weight bearing as tolerated      Mobility Bed Mobility Overal bed mobility: Needs Assistance Bed Mobility: Supine to Sit     Supine to sit: Mod assist;HOB elevated     General bed mobility comments: modA for trunk elevation and scooting hips to EOB w/ pad  Transfers Overall transfer level: Needs assistance Equipment used: Rolling walker (2 wheeled) Transfers: Sit to/from [facility] Sit to Stand: Min assist Stand pivot transfers: Min assist       General transfer comment: cues for hand placement and sequencing    Balance Overall balance assessment: Needs assistance;History of  Falls Sitting-balance support: Feet supported;No upper extremity supported Sitting balance-Leahy Scale: Poor Sitting balance - Comments: posterior lean without cues to lean forward due to pain   Standing balance support: During functional activity;Bilateral upper extremity supported Standing balance-Leahy Scale: Poor                             ADL either performed or assessed with clinical judgement   ADL Overall ADL's : Needs assistance/impaired Eating/Feeding: Set up;Sitting   Grooming: Set up;Bed level   Upper Body Bathing: Minimal assistance;Bed level   Lower Body Bathing: Maximal assistance;Sitting/lateral leans;Sit to/from stand   Upper Body Dressing : Minimal assistance;Sitting;Bed level   Lower Body Dressing: Maximal assistance;Sitting/lateral leans;Sit to/from stand   Toilet Transfer: Moderate assistance;BSC   Toileting- Clothing Manipulation and Hygiene: Minimal assistance;+2 for physical assistance;Cueing for sequencing;Sitting/lateral lean;Sit to/from stand       Functional mobility during ADLs: Minimal assistance;Rolling walker;Cueing for safety;Cueing for sequencing General ADL Comments: Pt requiring increased time overall minA for UB and modA for LB ADL     Vision Baseline Vision/History: No visual deficits Vision Assessment?: No apparent visual deficits     Perception     Praxis      Pertinent Vitals/Pain Pain Assessment: 0-10 Pain Score: 7  Pain Location: Rt hip with movement Pain Descriptors / Indicators: Aching;Sore Pain Intervention(s): Premedicated before session;Limited activity within patient's tolerance;Repositioned     Hand Dominance Right   Extremity/Trunk Assessment Upper Extremity Assessment Upper Extremity Assessment: Generalized weakness   Lower Extremity Assessment Lower Extremity Assessment: RLE deficits/detail RLE Deficits / Details: s/p  IM nailing, WBAT; decreased mobility   Cervical / Trunk  Assessment Cervical / Trunk Assessment: Normal   Communication Communication Communication: No difficulties   Cognition Arousal/Alertness: Awake/alert Behavior During Therapy: WFL for tasks assessed/performed Overall Cognitive Status: Impaired/Different from baseline Area of Impairment: Following commands;Safety/judgement;Awareness                 Orientation Level: Disoriented to;Time   Memory: Decreased short-term memory Following Commands: Follows one step commands with increased time Safety/Judgement: Decreased awareness of safety;Decreased awareness of deficits     General Comments: pt unable to recall date, slow processing, possibly due to medication   General Comments  pt wondering where her cousin Kristy Mckinney was and had to be redirected.    Exercises     Shoulder Instructions      Home Living Family/patient expects to be discharged to:: Private residence Living Arrangements: Alone Available Help at Discharge: Family;Available PRN/intermittently Type of Home: Apartment Home Access: Level entry     Home Layout: One level     Bathroom Shower/Tub: Teacher, early years/pre: Standard Bathroom Accessibility: Yes   Home Equipment: Cane - single point;Shower seat;Bedside commode;Walker - 2 wheels          Prior Functioning/Environment Level of Independence: Independent with assistive device(s)        Comments: uses cane in community, occasionally drives, enjoys crochet and cards        OT Problem List: Decreased strength;Decreased activity tolerance;Impaired balance (sitting and/or standing);Decreased safety awareness;Decreased coordination;Pain      OT Treatment/Interventions: Self-care/ADL training;Therapeutic exercise;Neuromuscular education;Energy conservation;DME and/or AE instruction;Therapeutic activities;Patient/family education;Balance training    OT Goals(Current goals can be found in the care plan section) Acute Rehab OT  Goals Patient Stated Goal: return home and crochet OT Goal Formulation: With patient Time For Goal Achievement: 12/15/18 Potential to Achieve Goals: Good  OT Frequency: Min 3X/week   Barriers to D/C: Decreased caregiver support  lives alone       Co-evaluation              AM-PAC OT "6 Clicks" Daily Activity     Outcome Measure Help from another person eating meals?: A Little Help from another person taking care of personal grooming?: A Little Help from another person toileting, which includes using toliet, bedpan, or urinal?: A Lot Help from another person bathing (including washing, rinsing, drying)?: A Lot Help from another person to put on and taking off regular upper body clothing?: A Little Help from another person to put on and taking off regular lower body clothing?: A Lot 6 Click Score: 15   End of Session Equipment Utilized During Treatment: Gait belt;Rolling walker Nurse Communication: Mobility status  Activity Tolerance: Patient tolerated treatment well Patient left: in chair;with call bell/phone within reach;with chair alarm set  OT Visit Diagnosis: Unsteadiness on feet (R26.81);Muscle weakness (generalized) (M62.81)                Time: 4627-0350 OT Time Calculation (min): 22 min Charges:  OT General Charges $OT Visit: 1 Visit OT Evaluation $OT Eval Moderate Complexity: 1 Mod  Darryl Nestle) Marsa Aris OTR/L Acute Rehabilitation Services Pager: [phone] Office: [place] 12/01/2018, 4:23 PM

## 2018-12-01 NOTE — Progress Notes (Signed)
Patient ID: Kristy Mckinney, female   DOB: 07/26/1931, 85 y.o.   MRN: [id number]     Subjective:  Patient reports pain as mild.  Patient in bed and confused but does respond to questions and follow commands  Objective:   VITALS:   Vitals:   11/30/18 2355 12/01/18 0555 12/01/18 0844 12/01/18 1100  BP: 118/87 94/79 110/65   Pulse:  100 (!) 108   Resp:  14  (!) 23  Temp:  97.6 F (36.4 C)    TempSrc:  Axillary    SpO2:  94%    Weight:  69.2 kg    Height:        ABD soft Sensation intact distally Dorsiflexion/Plantar flexion intact Incision: dressing C/D/I and no drainage   Lab Results  Component Value Date   WBC 9.0 11/30/2018   HGB 10.6 (L) 11/30/2018   HCT 34.9 (L) 11/30/2018   MCV 85.5 11/30/2018   PLT 192 11/30/2018   BMET    Component Value Date/Time   NA 135 11/30/2018 0410   K 4.7 11/30/2018 0410   CL 98 11/30/2018 0410   CO2 27 11/30/2018 0410   GLUCOSE 149 (H) 11/30/2018 0410   BUN 26 (H) 11/30/2018 0410   CREATININE 1.66 (H) 11/30/2018 0410   CALCIUM 9.3 11/30/2018 0410   GFRNONAA 28 (L) 11/30/2018 0410   GFRAA 32 (L) 11/30/2018 0410     Assessment/Plan: 2 Days Post-Op   Principal Problem:   Fracture, intertrochanteric, right femur (HCC) Active Problems:   Atrial fibrillation with RVR (HCC)   Anxiety and depression   Closed fracture of right femur, unspecified fracture morphology, initial encounter (HCC)   Acute diastolic CHF (congestive heart failure) (HCC)   Advance diet Up with therapy Continue plan per medicine  WBAT Dry dressing PRN   Lunette Stands 12/01/2018, 11:26 AM   Ophelia Charter MD

## 2018-12-01 NOTE — Progress Notes (Signed)
PT Cancellation Note  Patient Details Name: Kristy Mckinney MRN: [id number] DOB: 03-31-1932   Cancelled Treatment:    Reason Eval/Treat Not Completed: Other (comment) (pt highly confused does not believe she is in the hospital, unable to be redirected, agitated, hallucinating seeing smoke at window and additional people in room, refusing all attempts for mobility.   Sandy Salaam Alvine Mostafa 12/01/2018, 7:50 AM  [place] Acute Rehabilitation Services Pager: [phone] Office: [phone]

## 2018-12-01 NOTE — Progress Notes (Signed)
Patient hasn't voided at this time, bladder scanned for 160-200cc. Patient states that she's not uncomfortable, will continue to monitor and will notify MD.

## 2018-12-01 NOTE — Progress Notes (Signed)
Pt agitated and verbally/physically abusive toward staff this morning.  Making threats to harm staff.  Disoriented to place and paranoid.  Refused meds and breakfast.  Pt pulling at lines, pulled IV out.  [place] applied for safety.

## 2018-12-01 NOTE — Progress Notes (Addendum)
PROGRESS NOTE  Kristy Mckinney EXN:[id number] DOB: 1931/08/08 DOA: 11/28/2018 PCP: [name], MD   LOS: 3 days   Brief narrative: Kristy Mckinney is a 85 y.o. female with medical history of chronic back pain who initially presented to any pain hospital after a fall at home which was thought to be mechanical in nature.  No syncope was reported.  Patient was recently discharged from the hospital after recently from 11/23/2020 11/27/2018 with a diagnosis of new onset atrial fibrillation and was started on Cardizem p.o. daily and Eliquis.  She did have a immediate pain in the hip and was unable to ambulate.  A x-ray was performed including a CT scan which showed left intertrochanteric hip fracture.  Patient was then transferred to [facility] and consult orthopedic surgery.A. fib she was found to have A-fib with rapid ventricular response and was  started on Cardizem drip..   Subjective: Today, Nursing staff reported that the patient was agitated confused and delirious.  She was trying to pull lines.  Patient was much more calm and communicative at the time of my evaluation.  Assessment/Plan:  Principal Problem:   Fracture, intertrochanteric, right femur (HCC) Active Problems:   Atrial fibrillation with RVR (HCC)   Anxiety and depression   Closed fracture of right femur, unspecified fracture morphology, initial encounter ([facility])   Acute diastolic CHF (congestive heart failure) (HCC)  Postoperative/hospital induced delirium.  Will closely monitor.  Will address underlying causes including bowel and bladder pain.  Could consider Haldol as needed for delirium.  Currently see appears to be more stable at this time.  We will try to avoid sedative-hypnotics if possible.  Closed fracture of the right hip-nondisplaced intertrochanteric fracture of femur.  Status post surgical intervention with IM nailing.   Orthopedics recommended weightbearing as tolerated.  Continue physical therapy.  Atrial  fibrillation with RVR on presentation. Patient was briefly on Cardizem drip.  This has been changed to oral Cardizem. Mildly tachycardic today due to agitation and confusion. Anticoagulation has been initiated.  Continue physical therapy.  TSH from 11/25/2018 at 1.6.  2D echocardiogram from 11/25/2018 shows left ventricular ejection fraction of 65%.  Acute diastolic congestive heart failure As per the admitting attending's assessment.  Currently stable.  Patient received 1 dose of IV Lasix.  2D echocardiogram from recent reviewed.  Consider Lasix if needed.  On room air currentlyAnd saturating 94% on room air..  Chronic back pain- On hydrocodone at home.   Anxiety / depression cont Zoloft.  Hold Xanax  GERD.  Continue Protonix.  VTE Prophylaxis: Eliquis   Code Status: Full code  Family Communication:  I spoke with the patient's daughter on the phone and updated her about the clinical condition of the patient and   Disposition Plan:  CIR as per physical therapy recommendations.  Consultants:  Orthopedics  Procedures:  Intramedullary nail placement by Dr. Edmonia Lynch on 11/29/2018  Antibiotics: Anti-infectives (From admission, onward)   Start     Dose/Rate Route Frequency Ordered Stop   11/29/18 1600  clindamycin (CLEOCIN) IVPB 600 mg     600 mg 100 mL/hr over 30 Minutes Intravenous Every 6 hours 11/29/18 1547 11/29/18 2234   11/29/18 1230  clindamycin (CLEOCIN) IVPB 900 mg     900 mg 100 mL/hr over 30 Minutes Intravenous On call to O.R. 11/28/18 1705 11/29/18 1348     Objective: Vitals:   12/01/18 1136 12/01/18 1228  BP: 119/64 100/69  Pulse: (!) 112   Resp: 16  Temp: 98 F (36.7 C)   SpO2: 94%     Intake/Output Summary (Last 24 hours) at 12/01/2018 1244 Last data filed at 12/01/2018 0600 Gross per 24 hour  Intake 670.14 ml  Output 400 ml  Net 270.14 ml   Filed Weights   11/29/18 0500 11/30/18 0418 12/01/18 0555  Weight: 65.9 kg 69.1 kg 69.2 kg   Body mass  index is 25.39 kg/m.   Physical Exam: GENERAL: Patient is alert awake but confused. Not in obvious distress. HENT: No scleral pallor or icterus. Pupils equally reactive to light. Oral mucosa is moist NECK: is supple, no palpable thyroid enlargement. CHEST: Clear to auscultation. Diminished breath sounds bilaterally. CVS: S1 and S2 heard, no murmur.  Irregularly irregular rhythm. No pericardial rub. ABDOMEN: Soft, non-tender, bowel sounds are present. No palpable hepato-splenomegaly. EXTREMITIES: No edema.  Right hip dressing clean dry and intact..  Distal pulses palpable. CNS: Cranial nerves are intact. No focal motor or sensory deficits.  Disoriented SKIN: warm and dry without rashes.  Data Review: I have personally reviewed the following laboratory data and studies,  CBC: Recent Labs  Lab 11/28/18 0607 11/29/18 0340 11/30/18 0410  WBC 8.1 10.3 9.0  NEUTROABS 6.5  --   --   HGB 11.8* 10.9* 10.6*  HCT 38.8 35.0* 34.9*  MCV 85.8 84.3 85.5  PLT 226 241 119   Basic Metabolic Panel: Recent Labs  Lab 11/25/18 0735 11/28/18 0607 11/29/18 0340 11/30/18 0410  NA 141 138 139 135  K 3.4* 3.8 4.4 4.7  CL 104 101 99 98  CO2 26 26 27 27   GLUCOSE 95 115* 109* 149*  BUN 14 16 17  26*  CREATININE 0.71 0.68 1.06* 1.66*  CALCIUM 8.9 9.2 9.3 9.3  MG 1.5*  --   --  1.6*   Liver Function Tests: No results for input(s): AST, ALT, ALKPHOS, BILITOT, PROT, ALBUMIN in the last 168 hours. No results for input(s): LIPASE, AMYLASE in the last 168 hours. No results for input(s): AMMONIA in the last 168 hours. Cardiac Enzymes: No results for input(s): CKTOTAL, CKMB, CKMBINDEX, TROPONINI in the last 168 hours. BNP (last 3 results) No results for input(s): BNP in the last 8760 hours.  ProBNP (last 3 results) No results for input(s): PROBNP in the last 8760 hours.  CBG: No results for input(s): GLUCAP in the last 168 hours. Recent Results (from the past 240 hour(s))  SARS Coronavirus 2  (CEPHEID - Performed in [place] hospital lab), Hosp Order     Status: None   Collection Time: 11/24/18  4:13 PM  Result Value Ref Range Status   SARS Coronavirus 2 NEGATIVE NEGATIVE Final    Comment: (NOTE) If result is NEGATIVE SARS-CoV-2 target nucleic acids are NOT DETECTED. The SARS-CoV-2 RNA is generally detectable in upper and lower  respiratory specimens during the acute phase of infection. The lowest  concentration of SARS-CoV-2 viral copies this assay can detect is 250  copies / mL. A negative result does not preclude SARS-CoV-2 infection  and should not be used as the sole basis for treatment or other  patient management decisions.  A negative result may occur with  improper specimen collection / handling, submission of specimen other  than nasopharyngeal swab, presence of viral mutation(s) within the  areas targeted by this assay, and inadequate number of viral copies  (<250 copies / mL). A negative result must be combined with clinical  observations, patient history, and epidemiological information. If result is POSITIVE SARS-CoV-2 target  nucleic acids are DETECTED. The SARS-CoV-2 RNA is generally detectable in upper and lower  respiratory specimens dur ing the acute phase of infection.  Positive  results are indicative of active infection with SARS-CoV-2.  Clinical  correlation with patient history and other diagnostic information is  necessary to determine patient infection status.  Positive results do  not rule out bacterial infection or co-infection with other viruses. If result is PRESUMPTIVE POSTIVE SARS-CoV-2 nucleic acids MAY BE PRESENT.   A presumptive positive result was obtained on the submitted specimen  and confirmed on repeat testing.  While 2019 novel coronavirus  (SARS-CoV-2) nucleic acids may be present in the submitted sample  additional confirmatory testing may be necessary for epidemiological  and / or clinical management purposes  to differentiate  between  SARS-CoV-2 and other Sarbecovirus currently known to infect humans.  If clinically indicated additional testing with an alternate test  methodology [phone]) is advised. The SARS-CoV-2 RNA is generally  detectable in upper and lower respiratory sp ecimens during the acute  phase of infection. The expected result is Negative. Fact Sheet for Patients:  StrictlyIdeas.no Fact Sheet for Healthcare Providers: BankingDealers.co.za This test is not yet approved or cleared by the [place] FDA and has been authorized for detection and/or diagnosis of SARS-CoV-2 by FDA under an Emergency Use Authorization (EUA).  This EUA will remain in effect (meaning this test can be used) for the duration of the COVID-19 declaration under Section 564(b)(1) of the Act, 21 U.S.C. section 360bbb-3(b)(1), unless the authorization is terminated or revoked sooner. Performed at [facility], [address] [place], [place] [address]   MRSA PCR Screening     Status: None   Collection Time: 11/24/18  6:21 PM  Result Value Ref Range Status   MRSA by PCR NEGATIVE NEGATIVE Final    Comment:        The GeneXpert MRSA Assay (FDA approved for NASAL specimens only), is one component of a comprehensive MRSA colonization surveillance program. It is not intended to diagnose MRSA infection nor to guide or monitor treatment for MRSA infections. Performed at [facility], [address], [place], [place] [address]   SARS Coronavirus 2 (CEPHEID - Performed in [facility] hospital lab), Hosp Order     Status: None   Collection Time: 11/28/18  6:04 AM  Result Value Ref Range Status   SARS Coronavirus 2 NEGATIVE NEGATIVE Final    Comment: (NOTE) If result is NEGATIVE SARS-CoV-2 target nucleic acids are NOT DETECTED. The SARS-CoV-2 RNA is generally detectable in upper and lower  respiratory specimens during the acute phase of infection. The lowest  concentration of  SARS-CoV-2 viral copies this assay can detect is 250  copies / mL. A negative result does not preclude SARS-CoV-2 infection  and should not be used as the sole basis for treatment or other  patient management decisions.  A negative result may occur with  improper specimen collection / handling, submission of specimen other  than nasopharyngeal swab, presence of viral mutation(s) within the  areas targeted by this assay, and inadequate number of viral copies  (<250 copies / mL). A negative result must be combined with clinical  observations, patient history, and epidemiological information. If result is POSITIVE SARS-CoV-2 target nucleic acids are DETECTED. The SARS-CoV-2 RNA is generally detectable in upper and lower  respiratory specimens dur ing the acute phase of infection.  Positive  results are indicative of active infection with SARS-CoV-2.  Clinical  correlation with patient history  and other diagnostic information is  necessary to determine patient infection status.  Positive results do  not rule out bacterial infection or co-infection with other viruses. If result is PRESUMPTIVE POSTIVE SARS-CoV-2 nucleic acids MAY BE PRESENT.   A presumptive positive result was obtained on the submitted specimen  and confirmed on repeat testing.  While 2019 novel coronavirus  (SARS-CoV-2) nucleic acids may be present in the submitted sample  additional confirmatory testing may be necessary for epidemiological  and / or clinical management purposes  to differentiate between  SARS-CoV-2 and other Sarbecovirus currently known to infect humans.  If clinically indicated additional testing with an alternate test  methodology [phone]) is advised. The SARS-CoV-2 RNA is generally  detectable in upper and lower respiratory sp ecimens during the acute  phase of infection. The expected result is Negative. Fact Sheet for Patients:  StrictlyIdeas.no Fact Sheet for Healthcare  Providers: BankingDealers.co.za This test is not yet approved or cleared by the [place] FDA and has been authorized for detection and/or diagnosis of SARS-CoV-2 by FDA under an Emergency Use Authorization (EUA).  This EUA will remain in effect (meaning this test can be used) for the duration of the COVID-19 declaration under Section 564(b)(1) of the Act, 21 U.S.C. section 360bbb-3(b)(1), unless the authorization is terminated or revoked sooner. Performed at [facility], [address] [place], [place] [address]   Surgical pcr screen     Status: None   Collection Time: 11/28/18  9:43 PM  Result Value Ref Range Status   MRSA, PCR NEGATIVE NEGATIVE Final   Staphylococcus aureus NEGATIVE NEGATIVE Final    Comment: (NOTE) The Xpert SA Assay (FDA approved for NASAL specimens in patients 66 years of age and older), is one component of a comprehensive surveillance program. It is not intended to diagnose infection nor to guide or monitor treatment. Performed at [facility], [place] [address], [place], [place] [address]      Studies: Dg C-arm 1-60 Min  Result Date: 11/29/2018 CLINICAL DATA:  ORIF of the right hip EXAM: DG C-ARM 61-120 MIN COMPARISON:  11/28/2018 FINDINGS: Three images were obtained of the right femur during intramedullary nail placement. A total of 20 seconds of fluoroscopy time was utilized for this exam. The alignment appears improved. Again is an intratrochanteric fracture of the proximal right femur. IMPRESSION: Status post ORIF of the right femur. Electronically Signed   By: Constance Holster M.D.   On: 11/29/2018 16:00   Dg Femur, Min 2 Views Right  Result Date: 11/29/2018 CLINICAL DATA:  Right hip ORIF EXAM: RIGHT FEMUR 2 VIEWS COMPARISON:  11/28/2018 FINDINGS: The patient has undergone placement of a intramedullary nail through the proximal right femur. The alignment appears improved. There is no unexpected postoperative finding.  IMPRESSION: Status post ORIF of the right hip. Electronically Signed   By: Constance Holster M.D.   On: 11/29/2018 16:00    Scheduled Meds: . apixaban  2.5 mg Oral BID  . calcium-vitamin D  1 tablet Oral BID  . diltiazem  60 mg Oral Q6H  . docusate sodium  100 mg Oral BID  . feeding supplement (ENSURE ENLIVE)  237 mL Oral BID BM  . magnesium oxide  400 mg Oral BID  . multivitamin with minerals  1 tablet Oral Daily  . pantoprazole  40 mg Oral Daily  . sertraline  50 mg Oral q morning - 10a    Continuous Infusions:    Flora Lipps, MD  [facility] 12/01/2018

## 2018-12-02 LAB — BASIC METABOLIC PANEL
Anion gap: 9 (ref 5–15)
BUN: 34 mg/dL — ABNORMAL HIGH (ref 8–23)
CO2: 29 mmol/L (ref 22–32)
Calcium: 9.5 mg/dL (ref 8.9–10.3)
Chloride: 101 mmol/L (ref 98–111)
Creatinine, Ser: 1.03 mg/dL — ABNORMAL HIGH (ref 0.44–1.00)
GFR calc Af Amer: 58 mL/min — ABNORMAL LOW (ref >60)
GFR calc non Af Amer: 50 mL/min — ABNORMAL LOW (ref >60)
Glucose, Bld: 106 mg/dL — ABNORMAL HIGH (ref 70–99)
Potassium: 4.3 mmol/L (ref 3.5–5.1)
Sodium: 139 mmol/L (ref 135–145)

## 2018-12-02 LAB — CBC
HCT: 33.5 % — ABNORMAL LOW (ref 36.0–46.0)
Hemoglobin: 10.4 g/dL — ABNORMAL LOW (ref 12.0–15.0)
MCH: 25.9 pg — ABNORMAL LOW (ref 26.0–34.0)
MCHC: 31 g/dL (ref 30.0–36.0)
MCV: 83.5 fL (ref 80.0–100.0)
Platelets: 197 10*3/uL (ref 150–400)
RBC: 4.01 MIL/uL (ref 3.87–5.11)
RDW: 15.9 % — ABNORMAL HIGH (ref 11.5–15.5)
WBC: 8.9 10*3/uL (ref 4.0–10.5)
nRBC: 0 % (ref 0.0–0.2)

## 2018-12-02 LAB — MAGNESIUM: Magnesium: 2 mg/dL (ref 1.7–2.4)

## 2018-12-02 NOTE — Care Management Important Message (Signed)
Important Message  Patient Details  Name: Kristy Mckinney MRN: [id number] Date of Birth: Aug 21, 1931   Medicare Important Message Given:  Yes    [name] [address] 12/02/2018, 3:50 PM

## 2018-12-02 NOTE — Progress Notes (Signed)
Physical Therapy Treatment Patient Details Name: Kristy Mckinney MRN: [id number] DOB: 1931-09-30 Today's Date: 12/02/2018    History of Present Illness 85 yo recently admitted to [facility] 5/17-5/20 with afib. [facility] 5/21 with resultant left hip fx s/p IM nail 5/22. PMhx: Afib, anxiety/depression, CHf, chronic back pain    PT Comments    Pt pleasant today and does not recall events or hallucinations yesterday. Pt asking where granddaughter is but once informed of no visitors stating "oh yeah". Pt not oriented to day but following all commands with increased time. Pt able to progress to walking and HEP this session. Will continue to follow with D/C plan appropriate.  HR 92-132 SpO2 89-93% on RA    Follow Up Recommendations  CIR;Supervision/Assistance - 24 hour     Equipment Recommendations  None recommended by PT    Recommendations for Other Services       Precautions / Restrictions Precautions Precautions: Fall Restrictions RLE Weight Bearing: Weight bearing as tolerated    Mobility  Bed Mobility Overal bed mobility: Needs Assistance Bed Mobility: Supine to Sit     Supine to sit: Min assist     General bed mobility comments: min assist to fully elevate trunk from surface with increased time and cues as well as slight assist to get RLE fully to EOB  Transfers Overall transfer level: Needs assistance   Transfers: Sit to/from Stand Sit to Stand: Min assist         General transfer comment: cues for hand placement and sequencing  Ambulation/Gait Ambulation/Gait assistance: Min assist Gait Distance (Feet): 100 Feet Assistive device: Rolling walker (2 wheeled) Gait Pattern/deviations: Step-to pattern;Decreased stride length   Gait velocity interpretation: <1.8 ft/sec, indicate of risk for recurrent falls General Gait Details: cues for sequence, use of RW and safety. SPO2 89-93% on RA   Stairs             Wheelchair Mobility    Modified Rankin  (Stroke Patients Only)       Balance Overall balance assessment: Needs assistance;History of Falls Sitting-balance support: Feet supported;No upper extremity supported Sitting balance-Leahy Scale: Good     Standing balance support: During functional activity;Bilateral upper extremity supported Standing balance-Leahy Scale: Poor                              Cognition Arousal/Alertness: Awake/alert Behavior During Therapy: WFL for tasks assessed/performed Overall Cognitive Status: Impaired/Different from baseline Area of Impairment: Memory;Problem solving;Orientation                 Orientation Level: Disoriented to;Time   Memory: Decreased short-term memory Following Commands: Follows one step commands with increased time Safety/Judgement: Decreased awareness of deficits   Problem Solving: Slow processing General Comments: pt not oriented to day, does not recall events of yesterday      Exercises General Exercises - Lower Extremity Long Arc Quad: AROM;10 reps;Seated;Right Heel Slides: AAROM;10 reps;Supine;Right Hip ABduction/ADduction: AAROM;10 reps;Supine;Right Hip Flexion/Marching: AAROM;10 reps;Seated;Right    General Comments        Pertinent Vitals/Pain Pain Score: 6  Pain Location: Rt hip with movement Pain Descriptors / Indicators: Aching;Sore Pain Intervention(s): Limited activity within patient's tolerance;Premedicated before session;Monitored during session;Repositioned    Home Living                      Prior Function            PT Goals (  current goals can now be found in the care plan section) Progress towards PT goals: Progressing toward goals    Frequency           PT Plan Current plan remains appropriate    Co-evaluation              AM-PAC PT "6 Clicks" Mobility   Outcome Measure  Help needed turning from your back to your side while in a flat bed without using bedrails?: A Little Help needed  moving from lying on your back to sitting on the side of a flat bed without using bedrails?: A Little Help needed moving to and from a bed to a chair (including a wheelchair)?: A Little Help needed standing up from a chair using your arms (e.g., wheelchair or bedside chair)?: A Little Help needed to walk in hospital room?: A Little Help needed climbing 3-5 steps with a railing? : A Lot 6 Click Score: 17    End of Session Equipment Utilized During Treatment: Gait belt Activity Tolerance: Patient tolerated treatment well Patient left: in chair;with call bell/phone within reach;with chair alarm set Nurse Communication: Mobility status;Precautions PT Visit Diagnosis: Other abnormalities of gait and mobility (R26.89);Muscle weakness (generalized) (M62.81);Difficulty in walking, not elsewhere classified (R26.2)     Time: 0932-6712 PT Time Calculation (min) (ACUTE ONLY): 24 min  Charges:  $Gait Training: 8-22 mins $Therapeutic Exercise: 8-22 mins                     Brodey Bonn Pam Drown, PT Acute Rehabilitation Services Pager: [phone] Office: [place] 12/02/2018, 1:01 PM

## 2018-12-02 NOTE — Progress Notes (Signed)
PROGRESS NOTE  Kristy Mckinney TKZ:[id number] DOB: [birth year] DOA: 11/28/2018 PCP: [name], MD   LOS: 4 days   Brief narrative: Kristy Mckinney is a 85 y.o. female with medical history of chronic back pain who initially presented to any pain hospital after a fall at home which was thought to be mechanical in nature.  No syncope was reported.  Patient was recently discharged from the hospital after recently from 11/23/2020 11/27/2018 with a diagnosis of new onset atrial fibrillation and was started on Cardizem p.o. daily and Eliquis.  She did have a immediate pain in the hip and was unable to ambulate.  A x-ray was performed including a CT scan which showed left intertrochanteric hip fracture.  Patient was then transferred to [facility] and consult orthopedic surgery.A. fib she was found to have A-fib with rapid ventricular response and was  started on Cardizem drip.  Patient underwent hip surgery on 11/29/2018 by orthopedics.  Subjective: Today, patient feels okay.  More alert awake and communicative.  Denies any shortness of breath, nausea vomiting abdominal pain.  Assessment/Plan:  Principal Problem:   Fracture, intertrochanteric, right femur (HCC) Active Problems:   Atrial fibrillation with RVR (HCC)   Anxiety and depression   Closed fracture of right femur, unspecified fracture morphology, initial encounter ([place])   Acute diastolic CHF (congestive heart failure) (HCC)  Postoperative/hospital induced delirium.  Improved.  Will closely monitor.    Could consider Haldol as needed for delirium.  avoid sedative-hypnotics if possible.  Closed fracture of the right hip-nondisplaced intertrochanteric fracture of femur.  Status post surgical intervention with IM nailing on 11/29/2018.   Orthopedics recommended weightbearing as tolerated.  Continue physical therapy.  Physical therapy recommended CIR placement.  Atrial fibrillation with RVR on presentation. On oral Cardizem and Eliquis.   TSH  from 11/25/2018 at 1.6.  2D echocardiogram from 11/25/2018 shows left ventricular ejection fraction of 65%.  Acute diastolic congestive heart failure As per the admitting attending's assessment.  Improved.  Consider Lasix if needed.   saturating 93% on room air..  Chronic back pain- On hydrocodone at home.   Anxiety / depression cont Zoloft.  Hold Xanax  GERD.  Continue Protonix.  VTE Prophylaxis: Eliquis   Code Status: Full code  Family Communication:  None today  Disposition Plan:  CIR as per physical therapy recommendations.  Consultants:  Orthopedics  Procedures:  Intramedullary nail placement by Dr. Edmonia Lynch on 11/29/2018  Antibiotics: Anti-infectives (From admission, onward)   Start     Dose/Rate Route Frequency Ordered Stop   11/29/18 1600  clindamycin (CLEOCIN) IVPB 600 mg     600 mg 100 mL/hr over 30 Minutes Intravenous Every 6 hours 11/29/18 1547 11/29/18 2234   11/29/18 1230  clindamycin (CLEOCIN) IVPB 900 mg     900 mg 100 mL/hr over 30 Minutes Intravenous On call to O.R. 11/28/18 1705 11/29/18 1348     Objective: Vitals:   12/02/18 0556 12/02/18 0833  BP: 132/60 (!) 130/55  Pulse:  (!) 106  Resp:  18  Temp:  98.4 F (36.9 C)  SpO2:  93%    Intake/Output Summary (Last 24 hours) at 12/02/2018 1236 Last data filed at 12/02/2018 0645 Gross per 24 hour  Intake 551 ml  Output 1450 ml  Net -899 ml   Filed Weights   11/30/18 0418 12/01/18 0555 12/02/18 0459  Weight: 69.1 kg 69.2 kg 69.1 kg   Body mass index is 25.35 kg/m.   Physical Exam: GENERAL: Patient is  alert awake and communicative. Follows commands.Not in obvious distress. HENT: No scleral pallor or icterus. Pupils equally reactive to light. Oral mucosa is moist NECK: is supple, no palpable thyroid enlargement. CHEST: Clear to auscultation. Diminished breath sounds bilaterally. CVS: S1 and S2 heard, no murmur.  Irregularly irregular rhythm. No pericardial rub. ABDOMEN: Soft,  non-tender, bowel sounds are present. No palpable hepato-splenomegaly. EXTREMITIES: No edema.  Right hip dressing clean dry and intact..  Distal pulses palpable. CNS: Cranial nerves are intact. No focal motor or sensory deficits.  Disoriented SKIN: warm and dry without rashes.  Data Review: I have personally reviewed the following laboratory data and studies,  CBC: Recent Labs  Lab 11/28/18 0607 11/29/18 0340 11/30/18 0410 12/02/18 0422  WBC 8.1 10.3 9.0 8.9  NEUTROABS 6.5  --   --   --   HGB 11.8* 10.9* 10.6* 10.4*  HCT 38.8 35.0* 34.9* 33.5*  MCV 85.8 84.3 85.5 83.5  PLT 226 241 192 638   Basic Metabolic Panel: Recent Labs  Lab 11/28/18 0607 11/29/18 0340 11/30/18 0410 12/02/18 0422  NA 138 139 135 139  K 3.8 4.4 4.7 4.3  CL 101 99 98 101  CO2 26 27 27 29   GLUCOSE 115* 109* 149* 106*  BUN 16 17 26* 34*  CREATININE 0.68 1.06* 1.66* 1.03*  CALCIUM 9.2 9.3 9.3 9.5  MG  --   --  1.6* 2.0   Liver Function Tests: No results for input(s): AST, ALT, ALKPHOS, BILITOT, PROT, ALBUMIN in the last 168 hours. No results for input(s): LIPASE, AMYLASE in the last 168 hours. No results for input(s): AMMONIA in the last 168 hours. Cardiac Enzymes: No results for input(s): CKTOTAL, CKMB, CKMBINDEX, TROPONINI in the last 168 hours. BNP (last 3 results) No results for input(s): BNP in the last 8760 hours.  ProBNP (last 3 results) No results for input(s): PROBNP in the last 8760 hours.  CBG: No results for input(s): GLUCAP in the last 168 hours. Recent Results (from the past 240 hour(s))  SARS Coronavirus 2 (CEPHEID - Performed in [place] hospital lab), Hosp Order     Status: None   Collection Time: 11/24/18  4:13 PM  Result Value Ref Range Status   SARS Coronavirus 2 NEGATIVE NEGATIVE Final    Comment: (NOTE) If result is NEGATIVE SARS-CoV-2 target nucleic acids are NOT DETECTED. The SARS-CoV-2 RNA is generally detectable in upper and lower  respiratory specimens during  the acute phase of infection. The lowest  concentration of SARS-CoV-2 viral copies this assay can detect is 250  copies / mL. A negative result does not preclude SARS-CoV-2 infection  and should not be used as the sole basis for treatment or other  patient management decisions.  A negative result may occur with  improper specimen collection / handling, submission of specimen other  than nasopharyngeal swab, presence of viral mutation(s) within the  areas targeted by this assay, and inadequate number of viral copies  (<250 copies / mL). A negative result must be combined with clinical  observations, patient history, and epidemiological information. If result is POSITIVE SARS-CoV-2 target nucleic acids are DETECTED. The SARS-CoV-2 RNA is generally detectable in upper and lower  respiratory specimens dur ing the acute phase of infection.  Positive  results are indicative of active infection with SARS-CoV-2.  Clinical  correlation with patient history and other diagnostic information is  necessary to determine patient infection status.  Positive results do  not rule out bacterial infection or co-infection with  other viruses. If result is PRESUMPTIVE POSTIVE SARS-CoV-2 nucleic acids MAY BE PRESENT.   A presumptive positive result was obtained on the submitted specimen  and confirmed on repeat testing.  While 2019 novel coronavirus  (SARS-CoV-2) nucleic acids may be present in the submitted sample  additional confirmatory testing may be necessary for epidemiological  and / or clinical management purposes  to differentiate between  SARS-CoV-2 and other Sarbecovirus currently known to infect humans.  If clinically indicated additional testing with an alternate test  methodology [phone]) is advised. The SARS-CoV-2 RNA is generally  detectable in upper and lower respiratory sp ecimens during the acute  phase of infection. The expected result is Negative. Fact Sheet for Patients:   StrictlyIdeas.no Fact Sheet for Healthcare Providers: BankingDealers.co.za This test is not yet approved or cleared by the [place] FDA and has been authorized for detection and/or diagnosis of SARS-CoV-2 by FDA under an Emergency Use Authorization (EUA).  This EUA will remain in effect (meaning this test can be used) for the duration of the COVID-19 declaration under Section 564(b)(1) of the Act, 21 U.S.C. section 360bbb-3(b)(1), unless the authorization is terminated or revoked sooner. Performed at [facility], [address] [place], [place] [address]   MRSA PCR Screening     Status: None   Collection Time: 11/24/18  6:21 PM  Result Value Ref Range Status   MRSA by PCR NEGATIVE NEGATIVE Final    Comment:        The GeneXpert MRSA Assay (FDA approved for NASAL specimens only), is one component of a comprehensive MRSA colonization surveillance program. It is not intended to diagnose MRSA infection nor to guide or monitor treatment for MRSA infections. Performed at [facility], [address], [place], [place] [address]   SARS Coronavirus 2 (CEPHEID - Performed in [facility] hospital lab), Hosp Order     Status: None   Collection Time: 11/28/18  6:04 AM  Result Value Ref Range Status   SARS Coronavirus 2 NEGATIVE NEGATIVE Final    Comment: (NOTE) If result is NEGATIVE SARS-CoV-2 target nucleic acids are NOT DETECTED. The SARS-CoV-2 RNA is generally detectable in upper and lower  respiratory specimens during the acute phase of infection. The lowest  concentration of SARS-CoV-2 viral copies this assay can detect is 250  copies / mL. A negative result does not preclude SARS-CoV-2 infection  and should not be used as the sole basis for treatment or other  patient management decisions.  A negative result may occur with  improper specimen collection / handling, submission of specimen other  than nasopharyngeal swab, presence of  viral mutation(s) within the  areas targeted by this assay, and inadequate number of viral copies  (<250 copies / mL). A negative result must be combined with clinical  observations, patient history, and epidemiological information. If result is POSITIVE SARS-CoV-2 target nucleic acids are DETECTED. The SARS-CoV-2 RNA is generally detectable in upper and lower  respiratory specimens dur ing the acute phase of infection.  Positive  results are indicative of active infection with SARS-CoV-2.  Clinical  correlation with patient history and other diagnostic information is  necessary to determine patient infection status.  Positive results do  not rule out bacterial infection or co-infection with other viruses. If result is PRESUMPTIVE POSTIVE SARS-CoV-2 nucleic acids MAY BE PRESENT.   A presumptive positive result was obtained on the submitted specimen  and confirmed on repeat testing.  While 2019 novel coronavirus  (SARS-CoV-2) nucleic acids may be  present in the submitted sample  additional confirmatory testing may be necessary for epidemiological  and / or clinical management purposes  to differentiate between  SARS-CoV-2 and other Sarbecovirus currently known to infect humans.  If clinically indicated additional testing with an alternate test  methodology [phone]) is advised. The SARS-CoV-2 RNA is generally  detectable in upper and lower respiratory sp ecimens during the acute  phase of infection. The expected result is Negative. Fact Sheet for Patients:  StrictlyIdeas.no Fact Sheet for Healthcare Providers: BankingDealers.co.za This test is not yet approved or cleared by the [place] FDA and has been authorized for detection and/or diagnosis of SARS-CoV-2 by FDA under an Emergency Use Authorization (EUA).  This EUA will remain in effect (meaning this test can be used) for the duration of the COVID-19 declaration under Section  564(b)(1) of the Act, 21 U.S.C. section 360bbb-3(b)(1), unless the authorization is terminated or revoked sooner. Performed at [facility], [address] [place], [place] [address]   Surgical pcr screen     Status: None   Collection Time: 11/28/18  9:43 PM  Result Value Ref Range Status   MRSA, PCR NEGATIVE NEGATIVE Final   Staphylococcus aureus NEGATIVE NEGATIVE Final    Comment: (NOTE) The Xpert SA Assay (FDA approved for NASAL specimens in patients 31 years of age and older), is one component of a comprehensive surveillance program. It is not intended to diagnose infection nor to guide or monitor treatment. Performed at [facility], [place] [address] [place], [place] [address]      Studies: No results found.  Scheduled Meds: . apixaban  2.5 mg Oral BID  . calcium-vitamin D  1 tablet Oral BID  . diltiazem  60 mg Oral Q6H  . docusate sodium  100 mg Oral BID  . feeding supplement (ENSURE ENLIVE)  237 mL Oral BID BM  . magnesium oxide  400 mg Oral BID  . multivitamin with minerals  1 tablet Oral Daily  . pantoprazole  40 mg Oral Daily  . sertraline  50 mg Oral q morning - 10a    Continuous Infusions:    Flora Lipps, MD  [facility] 12/02/2018

## 2018-12-02 NOTE — Progress Notes (Signed)
Inpatient Rehabilitation-Admissions Coordinator   Spoke with family via phone multiple times today. They feel SNF is best option at this time given limited support at DC. AC will communicate with CM regarding family decision and preferences.   AC will sign off. Please call if questions.   Jhonnie Garner, OTR/L  Rehab Admissions Coordinator  [phone] 12/02/2018 5:12 PM

## 2018-12-02 NOTE — Progress Notes (Signed)
Inpatient Rehabilitation-Admissions Coordinator    Inpatient Rehab Consult received.  I met with pt at the bedside for rehabilitation assessment and to discuss goals and expectations of an inpatient rehab admission.   Pt appearing to be much more oriented and cooperative compared to yesterday's notes. Pt showing some interest in program but has deferred the decisions to her daughters at this time. AC has contacted Kath[facility] Look per pt request to discuss CIR program.   At this time, family unsure if pt will have the support in place needed at DC to support a short CIR stay (anticipate pt will need supervision). Family plans to discuss tonight and will call [facility] tomorrow morning to see if caregiver support available.   Please call if questions.   Jhonnie Garner, OTR/L  Rehab Admissions Coordinator  [phone] 12/02/2018 11:59 AM

## 2018-12-03 ENCOUNTER — Encounter (HOSPITAL_COMMUNITY): Payer: Self-pay | Admitting: Orthopedic Surgery

## 2018-12-03 LAB — CBC
HCT: 34 % — ABNORMAL LOW (ref 36.0–46.0)
Hemoglobin: 10.6 g/dL — ABNORMAL LOW (ref 12.0–15.0)
MCH: 25.9 pg — ABNORMAL LOW (ref 26.0–34.0)
MCHC: 31.2 g/dL (ref 30.0–36.0)
MCV: 83.1 fL (ref 80.0–100.0)
Platelets: 235 10*3/uL (ref 150–400)
RBC: 4.09 MIL/uL (ref 3.87–5.11)
RDW: 15.9 % — ABNORMAL HIGH (ref 11.5–15.5)
WBC: 9.4 10*3/uL (ref 4.0–10.5)
nRBC: 0 % (ref 0.0–0.2)

## 2018-12-03 LAB — BASIC METABOLIC PANEL
Anion gap: 9 (ref 5–15)
BUN: 20 mg/dL (ref 8–23)
CO2: 32 mmol/L (ref 22–32)
Calcium: 9.7 mg/dL (ref 8.9–10.3)
Chloride: 98 mmol/L (ref 98–111)
Creatinine, Ser: 0.85 mg/dL (ref 0.44–1.00)
GFR calc Af Amer: >60 mL/min (ref >60)
GFR calc non Af Amer: >60 mL/min (ref >60)
Glucose, Bld: 115 mg/dL — ABNORMAL HIGH (ref 70–99)
Potassium: 4 mmol/L (ref 3.5–5.1)
Sodium: 139 mmol/L (ref 135–145)

## 2018-12-03 LAB — MAGNESIUM: Magnesium: 1.6 mg/dL — ABNORMAL LOW (ref 1.7–2.4)

## 2018-12-03 MED ORDER — DILTIAZEM LOAD VIA INFUSION
10.0000 mg | Freq: Once | INTRAVENOUS | Status: AC
  Administered 2018-12-04: 10 mg via INTRAVENOUS
  Filled 2018-12-03: qty 10

## 2018-12-03 MED ORDER — DILTIAZEM HCL-DEXTROSE 100-5 MG/100ML-% IV SOLN (PREMIX)
5.0000 mg/h | INTRAVENOUS | Status: DC
  Administered 2018-12-04: 10 mg/h via INTRAVENOUS
  Administered 2018-12-04: 5 mg/h via INTRAVENOUS
  Filled 2018-12-03 (×3): qty 100

## 2018-12-03 MED ORDER — DILTIAZEM HCL 25 MG/5ML IV SOLN
10.0000 mg | Freq: Once | INTRAVENOUS | Status: DC
  Filled 2018-12-03: qty 5

## 2018-12-03 MED ORDER — HALOPERIDOL LACTATE 5 MG/ML IJ SOLN
1.0000 mg | Freq: Four times a day (QID) | INTRAMUSCULAR | Status: DC | PRN
  Administered 2018-12-03: 1 mg via INTRAVENOUS
  Filled 2018-12-03 (×3): qty 1

## 2018-12-03 MED ORDER — HALOPERIDOL LACTATE 5 MG/ML IJ SOLN
1.0000 mg | Freq: Once | INTRAMUSCULAR | Status: AC
  Administered 2018-12-03: 1 mg via INTRAVENOUS
  Filled 2018-12-03: qty 1

## 2018-12-03 MED ORDER — QUETIAPINE FUMARATE 25 MG PO TABS
25.0000 mg | ORAL_TABLET | Freq: Every day | ORAL | Status: DC
  Administered 2018-12-03 – 2018-12-04 (×2): 25 mg via ORAL
  Filled 2018-12-03 (×2): qty 1

## 2018-12-03 NOTE — Progress Notes (Signed)
Patient has been impulsive, combative, and aggressive tonight.  MD made aware and ordered Haldol one time.  I will keep monitoring patient.

## 2018-12-03 NOTE — Progress Notes (Signed)
Patient refused to transfer to chair this morning or ambulate with PT. Removed blood pressure cuff and refused to have vital signs done at 0800. She removed monitor and refuses to let us put it back on. Text page sent to provider, Flora Lipps MD.

## 2018-12-03 NOTE — Progress Notes (Addendum)
Nutrition Follow-up  INTERVENTION:   -Continue Ensure Enlive po BID, each supplement provides 350 kcal and 20 grams of protein -Continue Multivitamin with minerals daily  NUTRITION DIAGNOSIS:   Increased nutrient needs related to post-op healing as evidenced by estimated needs.  Ongoing.  GOAL:   Patient will meet greater than or equal to 90% of their needs  Progressing.  MONITOR:   PO intake, Supplement acceptance, Diet advancement, Labs, Weight trends, Skin, I & O's  ASSESSMENT:   Kristy Mckinney is a 85 y.o. female with medical atrial fibrillation, anxiety, GERD presenting after mechanical fall.  There was no syncope.  The patient was recently discharged from the hospital after a stay from 11/24/2018 through 11/27/2018 where she was diagnosed with new onset atrial fibrillation.  The patient was discharged home with diltiazem 240 mg daily and apixaban.  She had follow-up set up with cardiology.  5/22: s/p INTRAMEDULLARY (IM) NAIL INTERTROCHANTRIC FRACTURE  **RD working remotely**  Per chart review, pt with some agitation and combativeness overnight. Currently disoriented. Pt with documented intakes of 25-75% of meals on 5/24. Pt did have an Ensure last night but had not drank any before that time. Pt is consistently taking her daily MVI.   Per weight records, pt's weight has increased +9 lbs. Per I/O's: -543 ml since admit.   Medications: OSCAL w/ D tablet BID, MAG-OX tablet BID, Multivitamin with minerals daily,  Labs reviewed: Low Mg  Diet Order:   Diet Order            Diet Heart Room service appropriate? No; Fluid consistency: Thin  Diet effective now              EDUCATION NEEDS:   No education needs have been identified at this time  Skin:  Skin Assessment: Reviewed RN Assessment  Last BM:  5/25  Height:   Ht Readings from Last 1 Encounters:  11/28/18 5\' 5"  (1.651 m)    Weight:   Wt Readings from Last 1 Encounters:  12/03/18 68.1 kg     Ideal Body Weight:  56.8 kg  BMI:  Body mass index is 24.98 kg/m.  Estimated Nutritional Needs:   Kcal:  1650-1850  Protein:  80-95 grams  Fluid:  > 1.6 L  Clayton Bibles, MS, RD, LDN [place] Dietitian Pager: [phone] After Hours Pager: [phone]

## 2018-12-03 NOTE — Progress Notes (Signed)
Physical Therapy Treatment Patient Details Name: Kristy Mckinney MRN: [id number] DOB: 1932/04/24 Today's Date: 12/03/2018    History of Present Illness 85 yo recently admitted to [facility] 5/17-5/20 with afib. [facility] 5/21 with resultant left hip fx s/p IM nail 5/22. PMhx: Afib, anxiety/depression, CHf, chronic back pain    PT Comments    Pt EOB on arrival with nursing staff present. Pt agitated, stating need to void but no rationalization for getting to bathroom or BSC. Pt ultimately able to transfer to [facility] and back to bed with 2 person assist with pt more calm after voiding and pericare. Pt threatening, verbally abusive throughout session and cognition impairing ability to perform further mobility or strengthening. Noted pt received Haldol and norco early am. Family stated no assist for D/C with CIR declining admission with D/C recommendation updated to SNF. Will continue to follow as pt able to participate.  HR 92   Follow Up Recommendations  SNF;Supervision/Assistance - 24 hour     Equipment Recommendations  None recommended by PT    Recommendations for Other Services       Precautions / Restrictions Precautions Precautions: Fall Restrictions RLE Weight Bearing: Weight bearing as tolerated    Mobility  Bed Mobility Overal bed mobility: Needs Assistance Bed Mobility: Sit to Supine     Supine to sit: Mod assist;+2 for safety/equipment     General bed mobility comments: Pt EOB on arrival with RN and NT. Pt agitated, unaware of situation or place and threatening to get up and void in the floor. Return to bed after BSC with mod +2 assist  Transfers Overall transfer level: Needs assistance   Transfers: Sit to/from Stand;Stand Pivot Transfers Sit to Stand: Mod assist;+2 safety/equipment Stand pivot transfers: Mod assist;+2 safety/equipment       General transfer comment: mod +2 assist to stand from bed to [facility] and from [facility] back to bed. Mod assist with bil UE support with  face to face technique for pericare  Ambulation/Gait             General Gait Details: unable to attempt today due to cognition   Stairs             Wheelchair Mobility    Modified Rankin (Stroke Patients Only)       Balance Overall balance assessment: Needs assistance;History of Falls Sitting-balance support: Feet supported;No upper extremity supported Sitting balance-Leahy Scale: Good Sitting balance - Comments: pt able to maintain sitting EOB with supervision for safety   Standing balance support: During functional activity;Bilateral upper extremity supported Standing balance-Leahy Scale: Poor Standing balance comment: bil UE support in standing                            Cognition Arousal/Alertness: Awake/alert Behavior During Therapy: Agitated;Restless;Impulsive Overall Cognitive Status: Impaired/Different from baseline Area of Impairment: Memory;Problem solving;Orientation                 Orientation Level: Disoriented to;Time;Place;Situation   Memory: Decreased short-term memory   Safety/Judgement: Decreased awareness of deficits;Decreased awareness of safety     General Comments: pt agitated again today, calling all staff in room names, not following commands, threatening to hit staff but not swinging, unable to reorient or redirect      Exercises      General Comments        Pertinent Vitals/Pain Pain Assessment: No/denies pain    Home Living  Prior Function            PT Goals (current goals can now be found in the care plan section) Progress towards PT goals: Not progressing toward goals - comment(due to AMS)    Frequency    Min 4X/week      PT Plan Discharge plan needs to be updated    Co-evaluation              AM-PAC PT "6 Clicks" Mobility   Outcome Measure  Help needed turning from your back to your side while in a flat bed without using bedrails?: A Lot Help  needed moving from lying on your back to sitting on the side of a flat bed without using bedrails?: A Lot Help needed moving to and from a bed to a chair (including a wheelchair)?: A Lot Help needed standing up from a chair using your arms (e.g., wheelchair or bedside chair)?: A Lot Help needed to walk in hospital room?: Total Help needed climbing 3-5 steps with a railing? : Total 6 Click Score: 10    End of Session   Activity Tolerance: Treatment limited secondary to agitation Patient left: in bed;with call bell/phone within reach;with bed alarm set;with nursing/sitter in room Nurse Communication: Mobility status;Precautions PT Visit Diagnosis: Other abnormalities of gait and mobility (R26.89);Muscle weakness (generalized) (M62.81);Difficulty in walking, not elsewhere classified (R26.2)     Time: 4982-6415 PT Time Calculation (min) (ACUTE ONLY): 16 min  Charges:  $Therapeutic Activity: 8-22 mins                     Verdine Grenfell Pam Drown, PT Acute Rehabilitation Services Pager: [phone] Office: [place] 12/03/2018, 12:04 PM

## 2018-12-03 NOTE — Progress Notes (Signed)
OT Cancellation Note  Patient Details Name: ADAIJAH ENDRES MRN: [id number] DOB: 1931-07-23   Cancelled Treatment:    Reason Eval/Treat Not Completed: Other (comment). Pt impulsive, combative, and aggressive overnight. Will hold attempting OT session until later today or tomorrow.  Tyrone Schimke, OT Acute Rehabilitation Services Pager: [phone] Office: [phone]  12/03/2018, 8:50 AM

## 2018-12-03 NOTE — NC FL2 (Signed)
[place] LEVEL OF CARE SCREENING TOOL     IDENTIFICATION  Patient Name: Kristy Mckinney Birthdate: 20-Feb-1932 Sex: female Admission Date (Current Location): 11/28/2018  [facility] and [place] Number:  Herbalist and Address:  [facility] [place], [place] [address], [place], [place] [address]      Provider Number: [id number]  Attending Physician Name and Address:  Flora Lipps, MD  Relative Name and Phone Number:  Sharion Dove     Current Level of Care: Hospital Recommended Level of Care: [place] Prior Approval Number:    Date Approved/Denied:   PASRR Number: [id number]  Discharge Plan: SNF    Current Diagnoses: Patient Active Problem List   Diagnosis Date Noted  . Closed fracture of right femur, unspecified fracture morphology, initial encounter ([place]) 11/28/2018  . Acute diastolic CHF (congestive heart failure) ([place]) 11/28/2018  . Fracture, intertrochanteric, right femur ([place]) 11/28/2018  . Atrial fibrillation with rapid ventricular response ([place]) 11/25/2018  . Atrial fibrillation with RVR ([place]) 11/24/2018  . Chronic back pain 11/24/2018  . Anxiety and depression 11/24/2018  . Constipation 05/03/2016  . Dysphagia 05/03/2016  . Cholecystitis with cholelithiasis 01/29/2014    Orientation RESPIRATION BLADDER Height & Weight     Self  Normal Continent Weight: 150 lb 2.1 oz (68.1 kg)(hip fracture un able to stand) Height:  5\' 5"  (165.1 cm)  BEHAVIORAL SYMPTOMS/MOOD NEUROLOGICAL BOWEL NUTRITION STATUS      Continent Diet(please see discharge summary )  AMBULATORY STATUS COMMUNICATION OF NEEDS Skin     Verbally Surgical wounds(incision(closed)hip,right)                       Personal Care Assistance Level of Assistance  Bathing, Feeding, Dressing Bathing Assistance: Limited assistance Feeding assistance: Independent Dressing Assistance: Limited assistance     Functional Limitations Info  Sight,  Hearing, Speech Sight Info: Adequate Hearing Info: Adequate Speech Info: Adequate    SPECIAL CARE FACTORS FREQUENCY  PT (By licensed PT), OT (By licensed OT)     PT Frequency: 5x per week  OT Frequency: 5x per week             Contractures Contractures Info: Not present    Additional Factors Info  Code Status, Allergies Code Status Info: Full Allergies Info: Penicillins, Feldene piroxicam, Sulfa Antibiotics           Current Medications (12/03/2018):  This is the current hospital active medication list Current Facility-Administered Medications  Medication Dose Route Frequency Provider Last Rate Last Dose  . acetaminophen (TYLENOL) tablet 325-650 mg  325-650 mg Oral Q6H PRN Prudencio Burly III, PA-C   650 mg at 12/03/18 1240  . apixaban (ELIQUIS) tablet 2.5 mg  2.5 mg Oral BID Prudencio Burly III, PA-C   2.5 mg at 12/03/18 1239  . bisacodyl (DULCOLAX) suppository 10 mg  10 mg Rectal Daily PRN Prudencio Burly III, PA-C      . calcium-vitamin D (OSCAL WITH D) 500-200 MG-UNIT per tablet 1 tablet  1 tablet Oral BID Prudencio Burly III, PA-C   1 tablet at 12/02/18 2125  . diltiazem (CARDIZEM) tablet 60 mg  60 mg Oral Q6H Pokhrel, Laxman, MD   60 mg at 12/03/18 1242  . docusate sodium (COLACE) capsule 100 mg  100 mg Oral BID Prudencio Burly III, PA-C   100 mg at 12/02/18 2124  . feeding supplement (ENSURE ENLIVE) (ENSURE ENLIVE) liquid 237 mL  237 mL Oral BID BM Prudencio Burly III, PA-C   237 mL at 12/02/18 1500  . HYDROcodone-acetaminophen (NORCO/VICODIN) 5-325 MG per tablet 1 tablet  1 tablet Oral Q6H PRN Pokhrel, Laxman, MD   1 tablet at 12/03/18 0140  . magnesium oxide (MAG-OX) tablet 400 mg  400 mg Oral BID Pokhrel, Laxman, MD   400 mg at 12/02/18 2125  . metoCLOPramide (REGLAN) tablet 5-10 mg  5-10 mg Oral Q8H PRN Prudencio Burly III, PA-C       Or  . metoCLOPramide (REGLAN) injection 5-10 mg  5-10 mg Intravenous Q8H PRN  Prudencio Burly III, PA-C      . multivitamin with minerals tablet 1 tablet  1 tablet Oral Daily Prudencio Burly III, PA-C   1 tablet at 12/02/18 0908  . ondansetron (ZOFRAN) tablet 4 mg  4 mg Oral Q6H PRN Prudencio Burly III, PA-C       Or  . ondansetron [facility]) injection 4 mg  4 mg Intravenous Q6H PRN Prudencio Burly III, PA-C      . pantoprazole (PROTONIX) EC tablet 40 mg  40 mg Oral Daily Prudencio Burly III, PA-C   40 mg at 12/03/18 1240  . polyethylene glycol (MIRALAX / GLYCOLAX) packet 17 g  17 g Oral Daily PRN Prudencio Burly III, PA-C      . QUEtiapine (SEROQUEL) tablet 25 mg  25 mg Oral QHS Pokhrel, Laxman, MD      . sertraline (ZOLOFT) tablet 50 mg  50 mg Oral q morning - 10a Prudencio Burly III, PA-C   50 mg at 12/03/18 1239  . sodium phosphate (FLEET) 7-19 GM/118ML enema 1 enema  1 enema Rectal Once PRN Prudencio Burly III, PA-C         Discharge Medications: Please see discharge summary for a list of discharge medications.  Relevant Imaging Results:  Relevant Lab Results:   Additional Information [phone]  Vinie Sill, LCSWA

## 2018-12-03 NOTE — TOC Initial Note (Signed)
Transition of Care [facility]) - Initial/Assessment Note    Patient Details  Name: Kristy Mckinney MRN: [id number] Date of Birth: 1931-11-29  Transition of Care [facility]) CM/SW Contact:    Vinie Sill, [place] Phone Number: 12/03/2018, 5:05 PM  Clinical Narrative:                 CSW spoke with the patient's daughter, [name], by phone. She reports the patient lives home alone. She has the support of her family but they are unable to provide the support/supervison  needed after discharge from CIR.   Patient's family recognizes need for rehab before returning home and is agreeable to a SNF placement. Patient's family reported preference for [facility].   Patient's family is realistic regarding therapy needs and expressed being hopeful for SNF placement at [facility], near their home. Family expressed understanding of CSW role and discharge process. No questions/concerns about plan or treatment at this time.  Thurmond Butts, MSW, [facility] Clinical Social Worker [phone]   Expected Discharge Plan: Skilled Nursing Facility Barriers to Discharge: SNF Pending bed offer   Patient Goals and CMS Choice   CMS Medicare.gov Compare Post Acute Care list provided to:: Patient Represenative (must comment)(patient's daughter)    Expected Discharge Plan and Services Expected Discharge Plan: [place] In-house Referral: Clinical Social Work     Living arrangements for the past 2 months: Apartment                                      Prior Living Arrangements/Services Living arrangements for the past 2 months: Apartment Lives with:: Self Patient language and need for interpreter reviewed:: No Do you feel safe going back to the place where you live?: No(patient's family wants Rehab at [facility])      Need for Family Participation in Patient Care: Yes (Comment) Care giver support system in place?: Yes (comment)   Criminal Activity/Legal Involvement Pertinent to  Current Situation/Hospitalization: No - Comment as needed  Activities of Daily Living      Permission Sought/Granted Permission sought to share information with : Case Manager, Family Supports, Customer service manager Permission granted to share information with : Yes, Verbal Permission Granted  Share Information with NAME: Leda Min   Permission granted to share info w AGENCY: SNFs  Permission granted to share info w Relationship: daughter   Permission granted to share info w Contact Information: [phone]  Emotional Assessment Appearance:: Other (Comment Required(unable to assess ) Attitude/Demeanor/Rapport: Unable to Assess Affect (typically observed): Unable to Assess Orientation: : Oriented to Self Alcohol / Substance Use: Not Applicable Psych Involvement: No (comment)  Admission diagnosis:  Somnolence [R40.0] SOB (shortness of breath) [R06.02] Atrial fibrillation with RVR (HCC) [I48.91] Closed fracture of right hip, initial encounter [facility]) [S72.001A] Patient Active Problem List   Diagnosis Date Noted  . Closed fracture of right femur, unspecified fracture morphology, initial encounter ([place]) 11/28/2018  . Acute diastolic CHF (congestive heart failure) ([place]) 11/28/2018  . Fracture, intertrochanteric, right femur ([place]) 11/28/2018  . Atrial fibrillation with rapid ventricular response ([place]) 11/25/2018  . Atrial fibrillation with RVR ([place]) 11/24/2018  . Chronic back pain 11/24/2018  . Anxiety and depression 11/24/2018  . Constipation 05/03/2016  . Dysphagia 05/03/2016  . Cholecystitis with cholelithiasis 01/29/2014   PCP:  [name], MD Pharmacy:   [place], [place] [address] [place]  [place] [address] Phone: [phone] Fax: [phone]     Social Determinants of Health (SDOH) Interventions    Readmission Risk Interventions Readmission Risk Prevention Plan 11/27/2018  Post Dischage Appt  Not Complete  Appt Comments cardiologist will call patient for appt.   Medication Screening Complete  Transportation Screening Complete  Some recent data might be hidden

## 2018-12-03 NOTE — Progress Notes (Signed)
PROGRESS NOTE  Kristy Mckinney:[id number] DOB: 1932-01-02 DOA: 11/28/2018 PCP: [name], MD   LOS: 5 days   Brief narrative: Kristy Mckinney is a 85 y.o. female with medical history of chronic back pain who initially presented to [facility] after a fall at home which was thought to be mechanical in nature.  Patient was recently discharged from the hospital after recently from 11/23/2020 11/27/2018 with a diagnosis of new onset atrial fibrillation and was started on Cardizem p.o. daily and Eliquis.  She did have a immediate pain in the hip and was unable to ambulate.  A x-ray was performed including a CT scan which showed left intertrochanteric hip fracture.  Patient was then transferred to [facility] and [facility] for orthopedic surgery. She was found to have A-fib with rapid ventricular response and was  started on Cardizem drip.  Patient underwent hip surgery on 11/29/2018 by orthopedics.  Subjective: Patient was little impulsive combative and aggressive overnight.  Patient refused to comply with the treatment.  Assessment/Plan:  Principal Problem:   Fracture, intertrochanteric, right femur (HCC) Active Problems:   Atrial fibrillation with RVR (HCC)   Anxiety and depression   Closed fracture of right femur, unspecified fracture morphology, initial encounter ([place])   Acute diastolic CHF (congestive heart failure) (HCC)  Postoperative/hospital induced delirium.  Worse in the evenings.   Could consider Haldol as needed for delirium.  avoid sedative-hypnotics if possible.  We will add a low-dose Seroquel at 25 mg at nighttime starting today to be adjusted or discontinued depending upon the response.  PRN Haldol for excessive agitation  Closed fracture of the right hip-nondisplaced intertrochanteric fracture of femur.  Status post surgical intervention with IM nailing on 11/29/2018.   Orthopedics recommended weightbearing as tolerated.  Continue physical therapy.  Physical therapy now  recommending skilled nursing facility placement.  Atrial fibrillation with RVR on presentation. On oral Cardizem and Eliquis.   TSH from 11/25/2018 at 1.6.  2D echocardiogram from 11/25/2018 shows left ventricular ejection fraction of 65%.  Consider changing to long acting Cardizem by tomorrow heart rate is optimally controlled.  Acute diastolic congestive heart failure On presentation.  Consider Lasix if needed.  Needing any supplemental oxygen.  Chronic back pain- On Oral narcotics at home   Anxiety / depression cont Zoloft.  Hold Xanax  GERD.  Continue Protonix.  VTE Prophylaxis: Eliquis   Code Status: Full code  Family Communication: None today.  Updated the patient's family yesterday.  Disposition Plan: Skilled nursing facility placement as per PT recommendation today.  Consultants:  Orthopedics  Procedures:  Intramedullary nail placement by Dr. Edmonia Lynch on 11/29/2018  Antibiotics: Anti-infectives (From admission, onward)   Start     Dose/Rate Route Frequency Ordered Stop   11/29/18 1600  clindamycin (CLEOCIN) IVPB 600 mg     600 mg 100 mL/hr over 30 Minutes Intravenous Every 6 hours 11/29/18 1547 11/29/18 2234   11/29/18 1230  clindamycin (CLEOCIN) IVPB 900 mg     900 mg 100 mL/hr over 30 Minutes Intravenous On call to O.R. 11/28/18 1705 11/29/18 1348     Objective: Vitals:   12/03/18 0453 12/03/18 0500  BP: (!) 146/76   Pulse: 90   Resp: 18   Temp: 98.3 F (36.8 C)   SpO2:  97%    Intake/Output Summary (Last 24 hours) at 12/03/2018 1622 Last data filed at 12/02/2018 2134 Gross per 24 hour  Intake 240 ml  Output 400 ml  Net -160 ml  Filed Weights   12/01/18 0555 12/02/18 0459 12/03/18 0500  Weight: 69.2 kg 69.1 kg 68.1 kg   Body mass index is 24.98 kg/m.   Physical Exam: GENERAL: Patient is alert awake and communicative slightly confused this morning.  Not in obvious distress. HENT: No scleral pallor or icterus. Pupils equally reactive to  light. Oral mucosa is moist NECK: is supple, no palpable thyroid enlargement. CHEST: Clear to auscultation. Diminished breath sounds bilaterally. CVS: S1 and S2 heard, no murmur.  Irregularly irregular rhythm. No pericardial rub. ABDOMEN: Soft, non-tender, bowel sounds are present. No palpable hepato-splenomegaly. EXTREMITIES: No edema.  Right hip dressing clean dry and intact..  Distal pulses palpable. CNS: Cranial nerves are intact.  Moves all extremities.  Confused this morning. SKIN: warm and dry without rashes.  Data Review: I have personally reviewed the following laboratory data and studies,  CBC: Recent Labs  Lab 11/28/18 0607 11/29/18 0340 11/30/18 0410 12/02/18 0422 12/03/18 0442  WBC 8.1 10.3 9.0 8.9 9.4  NEUTROABS 6.5  --   --   --   --   HGB 11.8* 10.9* 10.6* 10.4* 10.6*  HCT 38.8 35.0* 34.9* 33.5* 34.0*  MCV 85.8 84.3 85.5 83.5 83.1  PLT 226 241 192 197 222   Basic Metabolic Panel: Recent Labs  Lab 11/28/18 0607 11/29/18 0340 11/30/18 0410 12/02/18 0422 12/03/18 0442  NA 138 139 135 139 139  K 3.8 4.4 4.7 4.3 4.0  CL 101 99 98 101 98  CO2 26 27 27 29  32  GLUCOSE 115* 109* 149* 106* 115*  BUN 16 17 26* 34* 20  CREATININE 0.68 1.06* 1.66* 1.03* 0.85  CALCIUM 9.2 9.3 9.3 9.5 9.7  MG  --   --  1.6* 2.0 1.6*   Liver Function Tests: No results for input(s): AST, ALT, ALKPHOS, BILITOT, PROT, ALBUMIN in the last 168 hours. No results for input(s): LIPASE, AMYLASE in the last 168 hours. No results for input(s): AMMONIA in the last 168 hours. Cardiac Enzymes: No results for input(s): CKTOTAL, CKMB, CKMBINDEX, TROPONINI in the last 168 hours. BNP (last 3 results) No results for input(s): BNP in the last 8760 hours.  ProBNP (last 3 results) No results for input(s): PROBNP in the last 8760 hours.  CBG: No results for input(s): GLUCAP in the last 168 hours. Recent Results (from the past 240 hour(s))  SARS Coronavirus 2 (CEPHEID - Performed in [place]  hospital lab), Hosp Order     Status: None   Collection Time: 11/24/18  4:13 PM  Result Value Ref Range Status   SARS Coronavirus 2 NEGATIVE NEGATIVE Final    Comment: (NOTE) If result is NEGATIVE SARS-CoV-2 target nucleic acids are NOT DETECTED. The SARS-CoV-2 RNA is generally detectable in upper and lower  respiratory specimens during the acute phase of infection. The lowest  concentration of SARS-CoV-2 viral copies this assay can detect is 250  copies / mL. A negative result does not preclude SARS-CoV-2 infection  and should not be used as the sole basis for treatment or other  patient management decisions.  A negative result may occur with  improper specimen collection / handling, submission of specimen other  than nasopharyngeal swab, presence of viral mutation(s) within the  areas targeted by this assay, and inadequate number of viral copies  (<250 copies / mL). A negative result must be combined with clinical  observations, patient history, and epidemiological information. If result is POSITIVE SARS-CoV-2 target nucleic acids are DETECTED. The SARS-CoV-2 RNA is generally  detectable in upper and lower  respiratory specimens dur ing the acute phase of infection.  Positive  results are indicative of active infection with SARS-CoV-2.  Clinical  correlation with patient history and other diagnostic information is  necessary to determine patient infection status.  Positive results do  not rule out bacterial infection or co-infection with other viruses. If result is PRESUMPTIVE POSTIVE SARS-CoV-2 nucleic acids MAY BE PRESENT.   A presumptive positive result was obtained on the submitted specimen  and confirmed on repeat testing.  While 2019 novel coronavirus  (SARS-CoV-2) nucleic acids may be present in the submitted sample  additional confirmatory testing may be necessary for epidemiological  and / or clinical management purposes  to differentiate between  SARS-CoV-2 and other  Sarbecovirus currently known to infect humans.  If clinically indicated additional testing with an alternate test  methodology [phone]) is advised. The SARS-CoV-2 RNA is generally  detectable in upper and lower respiratory sp ecimens during the acute  phase of infection. The expected result is Negative. Fact Sheet for Patients:  StrictlyIdeas.no Fact Sheet for Healthcare Providers: BankingDealers.co.za This test is not yet approved or cleared by the [place] FDA and has been authorized for detection and/or diagnosis of SARS-CoV-2 by FDA under an Emergency Use Authorization (EUA).  This EUA will remain in effect (meaning this test can be used) for the duration of the COVID-19 declaration under Section 564(b)(1) of the Act, 21 U.S.C. section 360bbb-3(b)(1), unless the authorization is terminated or revoked sooner. Performed at [facility], [address], [place], [place] [address]   MRSA PCR Screening     Status: None   Collection Time: 11/24/18  6:21 PM  Result Value Ref Range Status   MRSA by PCR NEGATIVE NEGATIVE Final    Comment:        The GeneXpert MRSA Assay (FDA approved for NASAL specimens only), is one component of a comprehensive MRSA colonization surveillance program. It is not intended to diagnose MRSA infection nor to guide or monitor treatment for MRSA infections. Performed at [facility], [address] [place], [place] [address]   SARS Coronavirus 2 (CEPHEID - Performed in [facility] hospital lab), Hosp Order     Status: None   Collection Time: 11/28/18  6:04 AM  Result Value Ref Range Status   SARS Coronavirus 2 NEGATIVE NEGATIVE Final    Comment: (NOTE) If result is NEGATIVE SARS-CoV-2 target nucleic acids are NOT DETECTED. The SARS-CoV-2 RNA is generally detectable in upper and lower  respiratory specimens during the acute phase of infection. The lowest  concentration of SARS-CoV-2 viral copies this  assay can detect is 250  copies / mL. A negative result does not preclude SARS-CoV-2 infection  and should not be used as the sole basis for treatment or other  patient management decisions.  A negative result may occur with  improper specimen collection / handling, submission of specimen other  than nasopharyngeal swab, presence of viral mutation(s) within the  areas targeted by this assay, and inadequate number of viral copies  (<250 copies / mL). A negative result must be combined with clinical  observations, patient history, and epidemiological information. If result is POSITIVE SARS-CoV-2 target nucleic acids are DETECTED. The SARS-CoV-2 RNA is generally detectable in upper and lower  respiratory specimens dur ing the acute phase of infection.  Positive  results are indicative of active infection with SARS-CoV-2.  Clinical  correlation with patient history and other diagnostic information is  necessary to determine  patient infection status.  Positive results do  not rule out bacterial infection or co-infection with other viruses. If result is PRESUMPTIVE POSTIVE SARS-CoV-2 nucleic acids MAY BE PRESENT.   A presumptive positive result was obtained on the submitted specimen  and confirmed on repeat testing.  While 2019 novel coronavirus  (SARS-CoV-2) nucleic acids may be present in the submitted sample  additional confirmatory testing may be necessary for epidemiological  and / or clinical management purposes  to differentiate between  SARS-CoV-2 and other Sarbecovirus currently known to infect humans.  If clinically indicated additional testing with an alternate test  methodology [phone]) is advised. The SARS-CoV-2 RNA is generally  detectable in upper and lower respiratory sp ecimens during the acute  phase of infection. The expected result is Negative. Fact Sheet for Patients:  StrictlyIdeas.no Fact Sheet for Healthcare Providers:  BankingDealers.co.za This test is not yet approved or cleared by the [place] FDA and has been authorized for detection and/or diagnosis of SARS-CoV-2 by FDA under an Emergency Use Authorization (EUA).  This EUA will remain in effect (meaning this test can be used) for the duration of the COVID-19 declaration under Section 564(b)(1) of the Act, 21 U.S.C. section 360bbb-3(b)(1), unless the authorization is terminated or revoked sooner. Performed at [facility], [address], [place], [place] [address]   Surgical pcr screen     Status: None   Collection Time: 11/28/18  9:43 PM  Result Value Ref Range Status   MRSA, PCR NEGATIVE NEGATIVE Final   Staphylococcus aureus NEGATIVE NEGATIVE Final    Comment: (NOTE) The Xpert SA Assay (FDA approved for NASAL specimens in patients 45 years of age and older), is one component of a comprehensive surveillance program. It is not intended to diagnose infection nor to guide or monitor treatment. Performed at [facility], [place] [address], [place], [place] [address]      Studies: No results found.  Scheduled Meds: . apixaban  2.5 mg Oral BID  . calcium-vitamin D  1 tablet Oral BID  . diltiazem  60 mg Oral Q6H  . docusate sodium  100 mg Oral BID  . feeding supplement (ENSURE ENLIVE)  237 mL Oral BID BM  . magnesium oxide  400 mg Oral BID  . multivitamin with minerals  1 tablet Oral Daily  . pantoprazole  40 mg Oral Daily  . sertraline  50 mg Oral q morning - 10a    Continuous Infusions:    Flora Lipps, MD  [facility] 12/03/2018

## 2018-12-04 LAB — CBC
HCT: 34.3 % — ABNORMAL LOW (ref 36.0–46.0)
Hemoglobin: 10.7 g/dL — ABNORMAL LOW (ref 12.0–15.0)
MCH: 25.7 pg — ABNORMAL LOW (ref 26.0–34.0)
MCHC: 31.2 g/dL (ref 30.0–36.0)
MCV: 82.3 fL (ref 80.0–100.0)
Platelets: 205 10*3/uL (ref 150–400)
RBC: 4.17 MIL/uL (ref 3.87–5.11)
RDW: 16 % — ABNORMAL HIGH (ref 11.5–15.5)
WBC: 7.7 10*3/uL (ref 4.0–10.5)
nRBC: 0 % (ref 0.0–0.2)

## 2018-12-04 LAB — BASIC METABOLIC PANEL
Anion gap: 11 (ref 5–15)
BUN: 14 mg/dL (ref 8–23)
CO2: 28 mmol/L (ref 22–32)
Calcium: 9.5 mg/dL (ref 8.9–10.3)
Chloride: 99 mmol/L (ref 98–111)
Creatinine, Ser: 0.73 mg/dL (ref 0.44–1.00)
GFR calc Af Amer: >60 mL/min (ref >60)
GFR calc non Af Amer: >60 mL/min (ref >60)
Glucose, Bld: 115 mg/dL — ABNORMAL HIGH (ref 70–99)
Potassium: 3.8 mmol/L (ref 3.5–5.1)
Sodium: 138 mmol/L (ref 135–145)

## 2018-12-04 MED ORDER — DILTIAZEM HCL 60 MG PO TABS
60.0000 mg | ORAL_TABLET | Freq: Four times a day (QID) | ORAL | Status: DC
  Administered 2018-12-04 – 2018-12-05 (×5): 60 mg via ORAL
  Filled 2018-12-04 (×5): qty 1

## 2018-12-04 NOTE — Progress Notes (Addendum)
[facility] PROGRESS NOTE  PRIM MORACE XTK:[id number] DOB: 20-Nov-1931 DOA: 11/28/2018 PCP: [name], MD  Assessment/Plan: 1. Atrial fibrillation with RVR, improving, HR , 110 on diltiazem infusion.  Diltiazem drip had to be initiated overnight on 5/26.  Likely due to patient declining oral diltiazem dosing in setting of agitation overnight.  Mental status is much improved today if goes into RVR again will consult cardiology.  Continue Eliquis 2. Status post right hip fracture, repair on 5/22, stable.  PT recommends SNF, need to ensure A. fib is rate control before transition.  Orthopedic follow-up already arranged.  Bowel regimen in place, Tylenol as needed, Norco for severe pain judiciously 3. GERD, stable continue Protonix 4. Dementia with intermittent delirium in the setting of hospitalization.  Has improved with initiation of Seroquel (5/26),  closely monitor.  Delirium precautions. 5. CHF with preserved EF.  On presentation Lasix was used due to CHF patternon CXR; However patient currently euvolemic on room air. Weights are stable though slightly up from presentation, consider repeat CXR if goes back into RVR to ensure no CHF exacerbation ( tough to assess on physical exam), will closely monitor, daily weights, monitor output.  6. Depression, stable continue Zoloft, holding home Xanax  Code Status: Full code Family Communication: Will call Daughter [name] at listed phone contact [phone]) (indicate person spoken with, relationship, and if by phone, the number) Disposition Plan: Transition from IV diltiazem to oral diltiazem dosing over next 2 to 4 hours for monitoring RVR, if stable can consolidate allow for disposition to skilled nursing facility   Consultants:  Orthopedics  Procedures:  Intraoperative right intertrochanteric hip fracture repair (5/22)  Antibiotics:  None   HPI/Subjective:  Kristy Mckinney is a 85 y.o. year old female with medical history  significant for for GERD, recently diagnosed atrial fibrillation (11/24/2018) on Eliquis who presented on 11/28/2018 any pain hospital with unwitnessed fall presumed mechanical in nature and was found to have rightintertrochanteric hip fracture requiring transfer to [facility] for orthopedic repair (5/22).  Hospital course complicated by A. fib with RVR on presentation and recurrence overnight on 5/26.  This afternoon patient is not agitated.  Doing well.  No chest pain, no shortness of breath Heart rate improved on diltiazem drip  Objective: Vitals:   12/04/18 0420 12/04/18 1443  BP: 129/63 (!) 116/52  Pulse: (!) 123 95  Resp: 18 18  Temp: (!) 97.5 F (36.4 C) 98.4 F (36.9 C)  SpO2: 97% 95%    Intake/Output Summary (Last 24 hours) at 12/04/2018 1817 Last data filed at 12/04/2018 1444 Gross per 24 hour  Intake 238 ml  Output -  Net 238 ml   Filed Weights   12/01/18 0555 12/02/18 0459 12/03/18 0500  Weight: 69.2 kg 69.1 kg 68.1 kg    Exam:   General: Elderly female, lying in bed comfortably  Cardiovascular: Irregularly irregular rhythm, slightly tachycardic  Respiratory: Normal respiratory effort on room air  Abdomen: Soft, nondistended, nontender  Musculoskeletal: Limited range of motion on right hip related to pain  Skin operative dressing in place on right lateral hip (clean, dry, intact)  Neurologic alert, oriented to person, place, context, time  Data Reviewed: Basic Metabolic Panel: Recent Labs  Lab 11/29/18 0340 11/30/18 0410 12/02/18 0422 12/03/18 0442 12/04/18 0354  NA 139 135 139 139 138  K 4.4 4.7 4.3 4.0 3.8  CL 99 98 101 98 99  CO2 27 27 29  32 28  GLUCOSE 109* 149* 106* 115* 115*  BUN 17 26* 34* 20 14  CREATININE 1.06* 1.66* 1.03* 0.85 0.73  CALCIUM 9.3 9.3 9.5 9.7 9.5  MG  --  1.6* 2.0 1.6*  --    Liver Function Tests: No results for input(s): AST, ALT, ALKPHOS, BILITOT, PROT, ALBUMIN in the last 168 hours. No results for input(s): LIPASE,  AMYLASE in the last 168 hours. No results for input(s): AMMONIA in the last 168 hours. CBC: Recent Labs  Lab 11/28/18 0607 11/29/18 0340 11/30/18 0410 12/02/18 0422 12/03/18 0442 12/04/18 0354  WBC 8.1 10.3 9.0 8.9 9.4 7.7  NEUTROABS 6.5  --   --   --   --   --   HGB 11.8* 10.9* 10.6* 10.4* 10.6* 10.7*  HCT 38.8 35.0* 34.9* 33.5* 34.0* 34.3*  MCV 85.8 84.3 85.5 83.5 83.1 82.3  PLT 226 241 192 197 235 205   Cardiac Enzymes: No results for input(s): CKTOTAL, CKMB, CKMBINDEX, TROPONINI in the last 168 hours. BNP (last 3 results) No results for input(s): BNP in the last 8760 hours.  ProBNP (last 3 results) No results for input(s): PROBNP in the last 8760 hours.  CBG: No results for input(s): GLUCAP in the last 168 hours.  Recent Results (from the past 240 hour(s))  MRSA PCR Screening     Status: None   Collection Time: 11/24/18  6:21 PM  Result Value Ref Range Status   MRSA by PCR NEGATIVE NEGATIVE Final    Comment:        The GeneXpert MRSA Assay (FDA approved for NASAL specimens only), is one component of a comprehensive MRSA colonization surveillance program. It is not intended to diagnose MRSA infection nor to guide or monitor treatment for MRSA infections. Performed at [facility], [address] [place], [place] [address]   SARS Coronavirus 2 (CEPHEID - Performed in [facility] hospital lab), Hosp Order     Status: None   Collection Time: 11/28/18  6:04 AM  Result Value Ref Range Status   SARS Coronavirus 2 NEGATIVE NEGATIVE Final    Comment: (NOTE) If result is NEGATIVE SARS-CoV-2 target nucleic acids are NOT DETECTED. The SARS-CoV-2 RNA is generally detectable in upper and lower  respiratory specimens during the acute phase of infection. The lowest  concentration of SARS-CoV-2 viral copies this assay can detect is 250  copies / mL. A negative result does not preclude SARS-CoV-2 infection  and should not be used as the sole basis for treatment or other   patient management decisions.  A negative result may occur with  improper specimen collection / handling, submission of specimen other  than nasopharyngeal swab, presence of viral mutation(s) within the  areas targeted by this assay, and inadequate number of viral copies  (<250 copies / mL). A negative result must be combined with clinical  observations, patient history, and epidemiological information. If result is POSITIVE SARS-CoV-2 target nucleic acids are DETECTED. The SARS-CoV-2 RNA is generally detectable in upper and lower  respiratory specimens dur ing the acute phase of infection.  Positive  results are indicative of active infection with SARS-CoV-2.  Clinical  correlation with patient history and other diagnostic information is  necessary to determine patient infection status.  Positive results do  not rule out bacterial infection or co-infection with other viruses. If result is PRESUMPTIVE POSTIVE SARS-CoV-2 nucleic acids MAY BE PRESENT.   A presumptive positive result was obtained on the submitted specimen  and confirmed on repeat testing.  While 2019 novel coronavirus  (SARS-CoV-2) nucleic acids  may be present in the submitted sample  additional confirmatory testing may be necessary for epidemiological  and / or clinical management purposes  to differentiate between  SARS-CoV-2 and other Sarbecovirus currently known to infect humans.  If clinically indicated additional testing with an alternate test  methodology [phone]) is advised. The SARS-CoV-2 RNA is generally  detectable in upper and lower respiratory sp ecimens during the acute  phase of infection. The expected result is Negative. Fact Sheet for Patients:  StrictlyIdeas.no Fact Sheet for Healthcare Providers: BankingDealers.co.za This test is not yet approved or cleared by the [place] FDA and has been authorized for detection and/or diagnosis of SARS-CoV-2  by FDA under an Emergency Use Authorization (EUA).  This EUA will remain in effect (meaning this test can be used) for the duration of the COVID-19 declaration under Section 564(b)(1) of the Act, 21 U.S.C. section 360bbb-3(b)(1), unless the authorization is terminated or revoked sooner. Performed at [facility], [address], [place], [place] [address]   Surgical pcr screen     Status: None   Collection Time: 11/28/18  9:43 PM  Result Value Ref Range Status   MRSA, PCR NEGATIVE NEGATIVE Final   Staphylococcus aureus NEGATIVE NEGATIVE Final    Comment: (NOTE) The Xpert SA Assay (FDA approved for NASAL specimens in patients 50 years of age and older), is one component of a comprehensive surveillance program. It is not intended to diagnose infection nor to guide or monitor treatment. Performed at [facility], [place] [address] [place],  [address]      Studies: No results found.  Scheduled Meds: . apixaban  2.5 mg Oral BID  . calcium-vitamin D  1 tablet Oral BID  . docusate sodium  100 mg Oral BID  . feeding supplement (ENSURE ENLIVE)  237 mL Oral BID BM  . magnesium oxide  400 mg Oral BID  . multivitamin with minerals  1 tablet Oral Daily  . pantoprazole  40 mg Oral Daily  . QUEtiapine  25 mg Oral QHS  . sertraline  50 mg Oral q morning - 10a   Continuous Infusions: . diltiazem (CARDIZEM) infusion 10 mg/hr (12/04/18 1051)    Principal Problem:   Fracture, intertrochanteric, right femur (HCC) Active Problems:   Atrial fibrillation with RVR (HCC)   Anxiety and depression   Closed fracture of right femur, unspecified fracture morphology, initial encounter ([place])   Acute diastolic CHF (congestive heart failure) ([place])      Desiree Hane  [facility]

## 2018-12-05 ENCOUNTER — Inpatient Hospital Stay
Admission: RE | Admit: 2018-12-05 | Discharge: 2018-12-25 | Disposition: A | Discharge status: Home / Self Care | Payer: PPO | Source: Ambulatory Visit | Attending: Internal Medicine | Admitting: Internal Medicine

## 2018-12-05 DIAGNOSIS — F411 Generalized anxiety disorder: Secondary | ICD-10-CM | POA: Diagnosis not present

## 2018-12-05 DIAGNOSIS — Z7401 Bed confinement status: Secondary | ICD-10-CM | POA: Diagnosis not present

## 2018-12-05 DIAGNOSIS — Z4789 Encounter for other orthopedic aftercare: Secondary | ICD-10-CM | POA: Diagnosis not present

## 2018-12-05 DIAGNOSIS — R41841 Cognitive communication deficit: Secondary | ICD-10-CM | POA: Diagnosis not present

## 2018-12-05 DIAGNOSIS — K219 Gastro-esophageal reflux disease without esophagitis: Secondary | ICD-10-CM | POA: Diagnosis not present

## 2018-12-05 DIAGNOSIS — I959 Hypotension, unspecified: Secondary | ICD-10-CM | POA: Diagnosis not present

## 2018-12-05 DIAGNOSIS — F419 Anxiety disorder, unspecified: Secondary | ICD-10-CM | POA: Diagnosis not present

## 2018-12-05 DIAGNOSIS — F329 Major depressive disorder, single episode, unspecified: Secondary | ICD-10-CM | POA: Diagnosis not present

## 2018-12-05 DIAGNOSIS — Z9181 History of falling: Secondary | ICD-10-CM | POA: Diagnosis not present

## 2018-12-05 DIAGNOSIS — S72141D Displaced intertrochanteric fracture of right femur, subsequent encounter for closed fracture with routine healing: Secondary | ICD-10-CM | POA: Diagnosis not present

## 2018-12-05 DIAGNOSIS — S72141S Displaced intertrochanteric fracture of right femur, sequela: Secondary | ICD-10-CM | POA: Diagnosis not present

## 2018-12-05 DIAGNOSIS — M6281 Muscle weakness (generalized): Secondary | ICD-10-CM | POA: Diagnosis not present

## 2018-12-05 DIAGNOSIS — R5381 Other malaise: Secondary | ICD-10-CM | POA: Diagnosis not present

## 2018-12-05 DIAGNOSIS — S7291XA Unspecified fracture of right femur, initial encounter for closed fracture: Secondary | ICD-10-CM | POA: Diagnosis not present

## 2018-12-05 DIAGNOSIS — M2550 Pain in unspecified joint: Secondary | ICD-10-CM | POA: Diagnosis not present

## 2018-12-05 DIAGNOSIS — S72001A Fracture of unspecified part of neck of right femur, initial encounter for closed fracture: Secondary | ICD-10-CM | POA: Diagnosis not present

## 2018-12-05 DIAGNOSIS — M810 Age-related osteoporosis without current pathological fracture: Secondary | ICD-10-CM | POA: Diagnosis not present

## 2018-12-05 DIAGNOSIS — I5031 Acute diastolic (congestive) heart failure: Secondary | ICD-10-CM | POA: Diagnosis not present

## 2018-12-05 DIAGNOSIS — R296 Repeated falls: Secondary | ICD-10-CM | POA: Diagnosis not present

## 2018-12-05 DIAGNOSIS — F0391 Unspecified dementia with behavioral disturbance: Secondary | ICD-10-CM | POA: Diagnosis not present

## 2018-12-05 DIAGNOSIS — Z7901 Long term (current) use of anticoagulants: Secondary | ICD-10-CM | POA: Diagnosis not present

## 2018-12-05 DIAGNOSIS — R262 Difficulty in walking, not elsewhere classified: Secondary | ICD-10-CM | POA: Diagnosis not present

## 2018-12-05 DIAGNOSIS — R410 Disorientation, unspecified: Secondary | ICD-10-CM | POA: Diagnosis not present

## 2018-12-05 DIAGNOSIS — Z741 Need for assistance with personal care: Secondary | ICD-10-CM | POA: Diagnosis not present

## 2018-12-05 DIAGNOSIS — D62 Acute posthemorrhagic anemia: Secondary | ICD-10-CM | POA: Diagnosis not present

## 2018-12-05 DIAGNOSIS — S72141A Displaced intertrochanteric fracture of right femur, initial encounter for closed fracture: Secondary | ICD-10-CM | POA: Diagnosis not present

## 2018-12-05 DIAGNOSIS — M545 Low back pain: Secondary | ICD-10-CM | POA: Diagnosis not present

## 2018-12-05 DIAGNOSIS — K5909 Other constipation: Secondary | ICD-10-CM | POA: Diagnosis not present

## 2018-12-05 DIAGNOSIS — S72144D Nondisplaced intertrochanteric fracture of right femur, subsequent encounter for closed fracture with routine healing: Secondary | ICD-10-CM | POA: Diagnosis not present

## 2018-12-05 DIAGNOSIS — I4891 Unspecified atrial fibrillation: Secondary | ICD-10-CM | POA: Diagnosis not present

## 2018-12-05 LAB — BASIC METABOLIC PANEL
Anion gap: 3 — ABNORMAL LOW (ref 5–15)
BUN: 12 mg/dL (ref 8–23)
CO2: 35 mmol/L — ABNORMAL HIGH (ref 22–32)
Calcium: 9.1 mg/dL (ref 8.9–10.3)
Chloride: 101 mmol/L (ref 98–111)
Creatinine, Ser: 0.82 mg/dL (ref 0.44–1.00)
GFR calc Af Amer: >60 mL/min (ref >60)
GFR calc non Af Amer: >60 mL/min (ref >60)
Glucose, Bld: 94 mg/dL (ref 70–99)
Potassium: 4.2 mmol/L (ref 3.5–5.1)
Sodium: 139 mmol/L (ref 135–145)

## 2018-12-05 LAB — CBC
HCT: 32.3 % — ABNORMAL LOW (ref 36.0–46.0)
Hemoglobin: 10 g/dL — ABNORMAL LOW (ref 12.0–15.0)
MCH: 25.8 pg — ABNORMAL LOW (ref 26.0–34.0)
MCHC: 31 g/dL (ref 30.0–36.0)
MCV: 83.2 fL (ref 80.0–100.0)
Platelets: 224 10*3/uL (ref 150–400)
RBC: 3.88 MIL/uL (ref 3.87–5.11)
RDW: 15.9 % — ABNORMAL HIGH (ref 11.5–15.5)
WBC: 7.2 10*3/uL (ref 4.0–10.5)
nRBC: 0 % (ref 0.0–0.2)

## 2018-12-05 MED ORDER — LOPERAMIDE HCL 2 MG PO CAPS: 2.0000 mg | ORAL_CAPSULE | ORAL | 0 refills | Status: DC | PRN

## 2018-12-05 MED ORDER — ACETAMINOPHEN 325 MG PO TABS: 325.0000 mg | ORAL_TABLET | Freq: Four times a day (QID) | ORAL | Status: DC | PRN

## 2018-12-05 MED ORDER — APIXABAN 2.5 MG PO TABS: 2.5000 mg | ORAL_TABLET | Freq: Two times a day (BID) | ORAL | 0 refills | Status: DC

## 2018-12-05 MED ORDER — DIPHENOXYLATE-ATROPINE 2.5-0.025 MG PO TABS: 1.0000 | ORAL_TABLET | Freq: Four times a day (QID) | ORAL | 0 refills | Status: DC | PRN

## 2018-12-05 MED ORDER — ENSURE ENLIVE PO LIQD: 237.0000 mL | Freq: Two times a day (BID) | ORAL | 12 refills | Status: DC

## 2018-12-05 MED ORDER — SERTRALINE HCL 50 MG PO TABS: 50.0000 mg | ORAL_TABLET | Freq: Every morning | ORAL | Status: DC

## 2018-12-05 MED ORDER — DOCUSATE SODIUM 100 MG PO CAPS: 100.0000 mg | ORAL_CAPSULE | Freq: Two times a day (BID) | ORAL | 0 refills | Status: DC

## 2018-12-05 MED ORDER — DILTIAZEM HCL ER COATED BEADS 240 MG PO CP24: 240.0000 mg | ORAL_CAPSULE | Freq: Every day | ORAL | 12 refills | Status: DC

## 2018-12-05 MED ORDER — POLYETHYLENE GLYCOL 3350 17 G PO PACK: 17.0000 g | PACK | Freq: Every day | ORAL | 0 refills | Status: DC | PRN

## 2018-12-05 MED ORDER — QUETIAPINE FUMARATE 25 MG PO TABS: 25.0000 mg | ORAL_TABLET | Freq: Every day | ORAL | Status: DC

## 2018-12-05 MED ORDER — MAGNESIUM OXIDE 400 (241.3 MG) MG PO TABS: 400.0000 mg | ORAL_TABLET | Freq: Two times a day (BID) | ORAL | Status: DC

## 2018-12-05 MED ORDER — ALENDRONATE SODIUM 70 MG PO TABS: 70.0000 mg | ORAL_TABLET | ORAL | Status: DC

## 2018-12-05 MED ORDER — OMEPRAZOLE 20 MG PO CPDR: 20.0000 mg | DELAYED_RELEASE_CAPSULE | ORAL | Status: DC

## 2018-12-05 MED ORDER — ADULT MULTIVITAMIN W/MINERALS CH: 1.0000 | ORAL_TABLET | Freq: Every day | ORAL | Status: DC

## 2018-12-05 MED ORDER — CALCIUM CARBONATE-VITAMIN D 500-200 MG-UNIT PO TABS: 1.0000 | ORAL_TABLET | Freq: Two times a day (BID) | ORAL | Status: DC

## 2018-12-05 NOTE — TOC Transition Note (Signed)
Transition of Care [facility]) - CM/SW Discharge Note   Patient Details  Name: Kristy Mckinney MRN: [id number] Date of Birth: May 19, 1932  Transition of Care [facility]) CM/SW Contact:  Eileen Stanford, LCSW Phone Number: 12/05/2018, 12:45 PM   Clinical Narrative:  Clinical Social Worker facilitated patient discharge including contacting patient family and facility to confirm patient discharge plans.  Clinical information faxed to facility and family agreeable with plan.  CSW arranged ambulance transport via [place] to [facility] .  RN to call [phone] for report prior to discharge.    Final next level of care: Skilled Nursing Facility Barriers to Discharge: No Barriers Identified   Patient Goals and CMS Choice   CMS Medicare.gov Compare Post Acute Care list provided to:: Patient Represenative (must comment)(patient's daughter)    Discharge Placement              Patient chooses bed at: [facility] Patient to be transferred to facility by: [place] Name of family member notified: [name] Patient and family notified of of transfer: 12/05/18  Discharge Plan and Services In-house Referral: Clinical Social Work                                   Social Determinants of Health ([place]) Interventions     Readmission Risk Interventions Readmission Risk Prevention Plan 11/27/2018  Post Dischage Appt Not Complete  Appt Comments cardiologist will call patient for appt.   Medication Screening Complete  Transportation Screening Complete  Some recent data might be hidden

## 2018-12-05 NOTE — Progress Notes (Signed)
Occupational Therapy Treatment Patient Details Name: Kristy Mckinney MRN: [id number] DOB: [birth year] Today's Date: 12/05/2018    History of present illness 85 yo recently admitted to [facility] 5/17-5/20 with afib. [facility] 5/21 with resultant left hip fx s/p IM nail 5/22. PMhx: Afib, anxiety/depression, CHf, chronic back pain   OT comments  Pt progress well. Pt ambulating with Rw minguardA to minA for steadiness ~100' in hallway with increased time.  Pt performing grooming at sink in standing with minguardA for stability and pt tolerating x5 mins fo standing. Pt progressing well and continues to benefit from OT skilled services for ADL, mobility and safety. OT to follow acutely.  HR ~120-125 BPM with activity BP 107/59 after activity    Follow Up Recommendations  SNF;Supervision/Assistance - 24 hour    Equipment Recommendations  Other (comment)(to be determined at next venue)    Recommendations for Other Services      Precautions / Restrictions Precautions Precautions: Fall Restrictions Weight Bearing Restrictions: Yes RLE Weight Bearing: Weight bearing as tolerated       Mobility Bed Mobility Overal bed mobility: Needs Assistance Bed Mobility: Supine to Sit     Supine to sit: Min assist     General bed mobility comments: min assist to pivot to EOB and elevate trunk with increased time, HOB 15 degrees  Transfers Overall transfer level: Needs assistance Equipment used: Rolling walker (2 wheeled) Transfers: Sit to/from Stand Sit to Stand: Min assist         General transfer comment: increased time and effort    Balance Overall balance assessment: Needs assistance;History of Falls Sitting-balance support: Feet supported;No upper extremity supported Sitting balance-Leahy Scale: Good Sitting balance - Comments: supervision for sitting EOB and at toilet   Standing balance support: During functional activity;Bilateral upper extremity supported Standing  balance-Leahy Scale: Fair Standing balance comment: pt able to stand at sink to wash hands without UE support, bil UE support for gait                           ADL either performed or assessed with clinical judgement   ADL Overall ADL's : Needs assistance/impaired     Grooming: Supervision/safety;Standing                               Functional mobility during ADLs: Minimal assistance;Rolling walker;Cueing for sequencing;Cueing for safety       Vision   Vision Assessment?: No apparent visual deficits   Perception     Praxis      Cognition Arousal/Alertness: Awake/alert Behavior During Therapy: WFL for tasks assessed/performed Overall Cognitive Status: Impaired/Different from baseline Area of Impairment: Memory;Problem solving;Orientation                 Orientation Level: Disoriented to;Time   Memory: Decreased short-term memory Following Commands: Follows one step commands consistently Safety/Judgement: Decreased awareness of deficits;Decreased awareness of safety     General Comments: pt very pleasant and following commands today. pt does not recall her periods of confusion but is aware that she has had them and is apologetic        Exercises     Shoulder Instructions       General Comments      Pertinent Vitals/ Pain       Pain Assessment: 0-10 Pain Score: 5  Pain Location: Rt hip with movement Pain Descriptors / Indicators: Aching;Sore Pain  Intervention(s): Limited activity within patient's tolerance  Home Living                                          Prior Functioning/Environment              Frequency  Min 3X/week        Progress Toward Goals  OT Goals(current goals can now be found in the care plan section)  Progress towards OT goals: Progressing toward goals  Acute Rehab OT Goals Patient Stated Goal: return home and crochet OT Goal Formulation: With patient Time For Goal  Achievement: 12/15/18 Potential to Achieve Goals: Good  Plan Discharge plan remains appropriate    Co-evaluation                 AM-PAC OT "6 Clicks" Daily Activity     Outcome Measure   Help from another person eating meals?: A Little Help from another person taking care of personal grooming?: A Little Help from another person toileting, which includes using toliet, bedpan, or urinal?: A Lot Help from another person bathing (including washing, rinsing, drying)?: A Lot Help from another person to put on and taking off regular upper body clothing?: A Little Help from another person to put on and taking off regular lower body clothing?: A Lot 6 Click Score: 15    End of Session Equipment Utilized During Treatment: Gait belt;Rolling walker  OT Visit Diagnosis: Unsteadiness on feet (R26.81);Muscle weakness (generalized) (M62.81)   Activity Tolerance Patient tolerated treatment well   Patient Left in chair;with call bell/phone within reach;with chair alarm set   Nurse Communication Mobility status        Time: 3903-0092 OT Time Calculation (min): 29 min  Charges: OT General Charges $OT Visit: 1 Visit OT Treatments $Self Care/Home Management : 8-22 mins $Therapeutic Activity: 8-22 mins  Darryl Nestle) Marsa Aris OTR/L Acute Rehabilitation Services Pager: [phone] Office: [phone]    Kristy Mckinney 12/05/2018, 4:14 PM

## 2018-12-05 NOTE — TOC Progression Note (Signed)
Transition of Care [facility]) - Progression Note    Patient Details  Name: NORMALEE SISTARE MRN: [id number] Date of Birth: 1932-05-12  [facility]) CM/SW Contact  Eileen Stanford, LCSW Phone Number: 12/05/2018, 10:19 AM  Clinical Narrative:    [facility]. [employer] auth started. MD notified.    Expected Discharge Plan: Skilled Nursing Facility Barriers to Discharge: SNF Pending bed offer  Expected Discharge Plan and Services Expected Discharge Plan: [place] In-house Referral: Clinical Social Work     Living arrangements for the past 2 months: Apartment                                       Social Determinants of Health (SDOH) Interventions    Readmission Risk Interventions Readmission Risk Prevention Plan 11/27/2018  Post Dischage Appt Not Complete  Appt Comments cardiologist will call patient for appt.   Medication Screening Complete  Transportation Screening Complete  Some recent data might be hidden

## 2018-12-05 NOTE — Discharge Summary (Signed)
Discharge Summary  Kristy Mckinney CBJ:[id number] DOB: 10-Jan-1932  PCP: [name], MD  Admit date: 11/28/2018 Discharge date: 12/05/2018   Time spent: < 25 minutes  Admitted From: Home Disposition: Skilled nursing facility  Recommendations for Outpatient Follow-up:  1. Follow up with Edmonia Lynch, orthopedics. WBAT on RLE  Schedule appointment in 2 weeks ([phone]). leave postoperative dressing intact and dry until follow-up with orthopedics 2. MinimizePRN Norco for severe pain, Tylenol as needed for mild to moderate pain 3.     Discharge Diagnoses:  Active Hospital Problems   Diagnosis Date Noted   Fracture, intertrochanteric, right femur ([place]) 11/28/2018   Closed fracture of right femur, unspecified fracture morphology, initial encounter ([place]) 16/01/3709   Acute diastolic CHF (congestive heart failure) ([place]) 11/28/2018   Atrial fibrillation with RVR ([place]) 11/24/2018   Anxiety and depression 11/24/2018    Resolved Hospital Problems  No resolved problems to display.    Discharge Condition: Stable  CODE STATUS: Full code  History of present illness:  Kristy Mckinney is a 85 y.o. year old female with medical history significant for for GERD, recently diagnosed atrial fibrillation (11/24/2018) on Eliquis who presented on 11/28/2018 any pain hospital with unwitnessed fall presumed mechanical in nature and was found to have rightintertrochanteric hip fracture requiring transfer to [facility] for orthopedic repair (5/22). Remaining hospital course addressed in problem based format below:   Hospital Course:   1. Atrial fibrillation, now rate controlled.  Briefly required diltiazem drip on day of admission and was able to transition back to home regimen of diltiazem.  In the setting of agitation patient did miss some doses of diltiazem on 5/26 both with reorientation and Seroquel mental status improved and patient was able to again wean off diltiazem drip and  maintain heart rate less than 110 on oral diltiazem.  Continue Eliquis 2. Status post right hip fracture, repair on 5/22, stable.    Discharge to SNF as patient is not a CIR candidate and family unable to provide 24-hour assistance.  Orthopedics recommends weightbearing as tolerated, Orthopedic follow-up already arranged, leave postoperative dressing intact until follow-up with orthopedics.    PRN bowel regimen in place, Tylenol as needed, Norco for PRN severe pain provided on discharge 3. GERD, stable continue Protonix 4. Dementia with intermittent delirium in the setting of hospitalization.  Has improved with initiation of Seroquel, will continue on discharge Delirium precautions. 5. CHF with preserved EF.  On presentation Lasix was used due to CHF patternon on CXR; However patient currently euvolemic, and maintaining normal oxygen saturation on room air.  6. Depression, stable continue Zoloft, holding home Xanax   Consultations:  Orthopedics  Procedures/Studies:  Intraoperative right intertrochanteric hip fracture repair (5/22)  Discharge Exam: BP 115/79 (BP Location: Left Arm)    Pulse (!) 107    Temp 97.6 F (36.4 C) (Oral)    Resp 16    Ht 5\' 5"  (1.651 m)    Wt 68.1 kg Comment: hip fracture un able to stand   SpO2 94%    BMI 24.98 kg/m   General: Lying in bed, no apparent distress Eyes: EOMI, anicteric ENT: Oral Mucosa clear and moist Cardiovascular: irregularly irregular rhythm, mild tachycardia, no edema, Respiratory: Normal respiratory effort, lungs clear to auscultation bilaterally Abdomen: soft, non-distended, non-tender, normal bowel sounds Skin: postop dressing in place on left hip Neurologic: Grossly no focal neuro deficit.Mental status [place] person, place, time(year), and context, speech normal, Psychiatric:Appropriate affect, and mood   Discharge Instructions You were  cared for by a hospitalist during your hospital stay. If you have any questions about your discharge  medications or the care you received while you were in the hospital after you are discharged, you can call the unit and asked to speak with the hospitalist on call if the hospitalist that took care of you is not available. Once you are discharged, your primary care physician will handle any further medical issues. Please note that NO REFILLS for any discharge medications will be authorized once you are discharged, as it is imperative that you return to your primary care physician (or establish a relationship with a primary care physician if you do not have one) for your aftercare needs so that they can reassess your need for medications and monitor your lab values.  Discharge Instructions    Diet - low sodium heart healthy   Complete by:  As directed    Increase activity slowly   Complete by:  As directed      Allergies as of 12/05/2018      Reactions   Penicillins Anaphylaxis   Feldene [piroxicam] Nausea And Vomiting   Sulfa Antibiotics    swelling      Medication List    STOP taking these medications   ALPRAZolam 1 MG tablet Commonly known as:  XANAX   HYDROcodone-acetaminophen 10-325 MG tablet Commonly known as:  NORCO Replaced by:  HYDROcodone-acetaminophen 5-325 MG tablet     TAKE these medications   acetaminophen 325 MG tablet Commonly known as:  TYLENOL Take 1-2 tablets (325-650 mg total) by mouth every 6 (six) hours as needed for mild pain (pain score 1-3 or temp > 100.5).   alendronate 70 MG tablet Commonly known as:  FOSAMAX Take 1 tablet (70 mg total) by mouth once a week. Take with a full glass of water on an empty stomach.   apixaban 2.5 MG Tabs tablet Commonly known as:  ELIQUIS Take 1 tablet (2.5 mg total) by mouth 2 (two) times daily.   calcium-vitamin D 500-200 MG-UNIT tablet Commonly known as:  Os-Cal 500 + D Take 1 tablet by mouth 2 (two) times daily.   diltiazem 240 MG 24 hr capsule Commonly known as:  CARDIZEM CD Take 1 capsule (240 mg total) by  mouth daily.   diphenoxylate-atropine 2.5-0.025 MG tablet Commonly known as:  LOMOTIL Take 1 tablet by mouth 4 (four) times daily as needed for diarrhea or loose stools. What changed:  how much to take   docusate sodium 100 MG capsule Commonly known as:  COLACE Take 1 capsule (100 mg total) by mouth 2 (two) times daily.   feeding supplement (ENSURE ENLIVE) Liqd Take 237 mLs by mouth 2 (two) times daily between meals.   HAIR/SKIN/NAILS PO Take 1 tablet by mouth daily.   HYDROcodone-acetaminophen 5-325 MG tablet Commonly known as:  Norco Take 1-2 tablets by mouth every 6 (six) hours as needed for severe pain. Replaces:  HYDROcodone-acetaminophen 10-325 MG tablet   loperamide 2 MG capsule Commonly known as:  IMODIUM Take 1 capsule (2 mg total) by mouth as needed for diarrhea or loose stools. What changed:  how much to take   magnesium oxide 400 (241.3 Mg) MG tablet Commonly known as:  MAG-OX Take 1 tablet (400 mg total) by mouth 2 (two) times daily.   multivitamin with minerals Tabs tablet Take 1 tablet by mouth daily.   omeprazole 20 MG capsule Commonly known as:  PRILOSEC Take 1 capsule (20 mg total) by mouth every morning.  polyethylene glycol 17 g packet Commonly known as:  MIRALAX / GLYCOLAX Take 17 g by mouth daily as needed for mild constipation.   QUEtiapine 25 MG tablet Commonly known as:  SEROQUEL Take 1 tablet (25 mg total) by mouth at bedtime.   sertraline 50 MG tablet Commonly known as:  ZOLOFT Take 1 tablet (50 mg total) by mouth every morning.   SYSTANE OP Apply 1 drop to eye daily as needed. Dry Eyes      Allergies  Allergen Reactions   Penicillins Anaphylaxis   Feldene [Piroxicam] Nausea And Vomiting   Sulfa Antibiotics     swelling    Contact information for follow-up providers    Renette Butters, MD. Schedule an appointment as soon as possible for a visit in 2 weeks.   Specialty:  Orthopedic Surgery Contact information: [address] [place] [id number] [phone]            Contact information for after-discharge care    [facility] .   Service:  Skilled Nursing Contact information: [address] [place] [phone]                   The results of significant diagnostics from this hospitalization (including imaging, microbiology, ancillary and laboratory) are listed below for reference.    Significant Diagnostic Studies: Dg Chest 1 View  Result Date: 11/28/2018 CLINICAL DATA:  Hip fracture EXAM: CHEST  1 VIEW COMPARISON:  11/24/2018 FINDINGS: Cardiomegaly. Mitral annular calcification. Small pleural effusions and diffuse interstitial opacity. No pneumothorax. No appreciable fracture. IMPRESSION: CHF pattern. Electronically Signed   By: Monte Fantasia M.D.   On: 11/28/2018 07:00   Ct Head Wo Contrast  Result Date: 11/28/2018 CLINICAL DATA:  Multiple falls EXAM: CT HEAD WITHOUT CONTRAST CT CERVICAL SPINE WITHOUT CONTRAST TECHNIQUE: Multidetector CT imaging of the head and cervical spine was performed following the standard protocol without intravenous contrast. Multiplanar CT image reconstructions of the cervical spine were also generated. COMPARISON:  11/25/2018 head CT FINDINGS: CT HEAD FINDINGS Brain: No evidence of acute infarction, hemorrhage, hydrocephalus, extra-axial collection or mass lesion/mass effect. Mild chronic small vessel ischemia and volume loss for age. Vascular: Atherosclerosis Skull: Negative for fracture Sinuses/Orbits: No evidence of injury.  Bilateral cataract resection CT CERVICAL SPINE FINDINGS Alignment: Normal Skull base and vertebrae: Negative for acute fracture. Soft tissues and spinal canal: No prevertebral fluid or swelling. No visible canal hematoma. Disc levels: Calcified disc herniation at C5-6 with size implying flattening of the cord. Disc narrowing and uncovertebral  spurring causes foraminal stenosis at C4-5 to C6-7. Upper chest: A right pleural effusion is minimally covered. This was also seen on recent chest x-ray 11/24/2018 IMPRESSION: 1. No evidence of intracranial injury or cervical spine fracture. 2. Cervical spine degeneration with notable calcified disc herniation at C5-6 that impinges on the cord. Electronically Signed   By: Monte Fantasia M.D.   On: 11/28/2018 06:53   Ct Head Wo Contrast  Result Date: 11/25/2018 CLINICAL DATA:  Altered mental status.  Recent falls EXAM: CT HEAD WITHOUT CONTRAST TECHNIQUE: Contiguous axial images were obtained from the base of the skull through the vertex without intravenous contrast. COMPARISON:  None. FINDINGS: Brain: There is mild diffuse atrophy. There is no intracranial mass, hemorrhage, extra-axial fluid collection, or midline shift. There is patchy small vessel disease in the centra semiovale bilaterally. No acute appearing infarct is demonstrable on this study.  Vascular: There is no appreciable hyperdense vessel. There is calcification in the right carotid siphon region as well as in the distal right vertebral artery. Skull: The bony calvarium appears intact. Sinuses/Orbits: Visualized paranasal sinuses are clear. Orbits appear symmetric bilaterally. Other: Mastoid air cells clear. IMPRESSION: Mild atrophy with patchy periventricular small vessel disease. No evident acute infarct. No mass or hemorrhage. Foci of arterial vascular calcification noted. Electronically Signed   By: Lowella Grip III M.D.   On: 11/25/2018 10:56   Ct Cervical Spine Wo Contrast  Result Date: 11/28/2018 CLINICAL DATA:  Multiple falls EXAM: CT HEAD WITHOUT CONTRAST CT CERVICAL SPINE WITHOUT CONTRAST TECHNIQUE: Multidetector CT imaging of the head and cervical spine was performed following the standard protocol without intravenous contrast. Multiplanar CT image reconstructions of the cervical spine were also generated. COMPARISON:  11/25/2018  head CT FINDINGS: CT HEAD FINDINGS Brain: No evidence of acute infarction, hemorrhage, hydrocephalus, extra-axial collection or mass lesion/mass effect. Mild chronic small vessel ischemia and volume loss for age. Vascular: Atherosclerosis Skull: Negative for fracture Sinuses/Orbits: No evidence of injury.  Bilateral cataract resection CT CERVICAL SPINE FINDINGS Alignment: Normal Skull base and vertebrae: Negative for acute fracture. Soft tissues and spinal canal: No prevertebral fluid or swelling. No visible canal hematoma. Disc levels: Calcified disc herniation at C5-6 with size implying flattening of the cord. Disc narrowing and uncovertebral spurring causes foraminal stenosis at C4-5 to C6-7. Upper chest: A right pleural effusion is minimally covered. This was also seen on recent chest x-ray 11/24/2018 IMPRESSION: 1. No evidence of intracranial injury or cervical spine fracture. 2. Cervical spine degeneration with notable calcified disc herniation at C5-6 that impinges on the cord. Electronically Signed   By: Monte Fantasia M.D.   On: 11/28/2018 06:53   Ct Hip Right Wo Contrast  Result Date: 11/28/2018 CLINICAL DATA:  Fall with right hip pain.  Abnormal radiograph EXAM: CT OF THE RIGHT HIP WITHOUT CONTRAST TECHNIQUE: Multidetector CT imaging of the right hip was performed according to the standard protocol. Multiplanar CT image reconstructions were also generated. COMPARISON:  Radiography from earlier today FINDINGS: Oblique fracture through the intertrochanteric right femur without displacement. There is mild comminution at the greater trochanter. The hip is located. The visualized right hemipelvis is intact. Regional soft tissue edema and small joint effusion. Pelvic ascites which is trace where visualized and likely from volume overload given chest x-ray. IMPRESSION: Nondisplaced intertrochanteric right femur fracture. Electronically Signed   By: Monte Fantasia M.D.   On: 11/28/2018 06:59   Dg Chest  Port 1 View  Result Date: 11/24/2018 CLINICAL DATA:  Weakness.  Atrial fibrillation. EXAM: PORTABLE CHEST 1 VIEW COMPARISON:  01/29/2014 FINDINGS: The heart size is enlarged. There is aortic atherosclerosis. Bilateral pleural effusions and mild pulmonary edema identified. The atelectasis identified within the left midlung. IMPRESSION: 1. Cardiac enlargement, aortic atherosclerosis and congestive heart failure. 2.  Aortic Atherosclerosis (ICD10-I70.0). Electronically Signed   By: Kerby Moors M.D.   On: 11/24/2018 12:19   Dg C-arm 1-60 Min  Result Date: 11/29/2018 CLINICAL DATA:  ORIF of the right hip EXAM: DG C-ARM 61-120 MIN COMPARISON:  11/28/2018 FINDINGS: Three images were obtained of the right femur during intramedullary nail placement. A total of 20 seconds of fluoroscopy time was utilized for this exam. The alignment appears improved. Again is an intratrochanteric fracture of the proximal right femur. IMPRESSION: Status post ORIF of the right femur. Electronically Signed   By: Constance Holster M.D.   On: 11/29/2018 16:00  Dg Hip Unilat With Pelvis 2-3 Views Left  Result Date: 11/14/2018 CLINICAL DATA:  Fall with left posterior hip pain. EXAM: DG HIP (WITH OR WITHOUT PELVIS) 2-3V LEFT COMPARISON:  None. FINDINGS: Frontal pelvis shows bony demineralization. SI joints and symphysis pubis unremarkable. AP and frog-leg lateral views of the left hip show a sclerotic line superimposed on the subcapital region of the femoral neck with evidence of femoral head spurring. No worrisome lytic or sclerotic osseous abnormality. IMPRESSION: Sclerotic line superimposed on the left femoral neck likely related to a collar of hypertrophic spurs from degenerative change. No definite femoral neck fracture evident. If there is high clinical index of suspicion, MRI would be the study of choice to further evaluate. Electronically Signed   By: Misty Stanley M.D.   On: 11/14/2018 12:21   Dg Hip Unilat W Or Wo Pelvis 2-3  Views Right  Result Date: 11/28/2018 CLINICAL DATA:  Recurrent falls.  Right hip pain EXAM: DG HIP (WITH OR WITHOUT PELVIS) 2-3V RIGHT COMPARISON:  12/16/2013 FINDINGS: Unexpected lucency obliquely across the proximal femur at the level of the intertrochanteric segment. This persists on the full pelvis view. No dislocation. IMPRESSION: Probable nondisplaced intertrochanteric femur fracture on the right. Would confirm with CT prior to any surgery. Electronically Signed   By: Monte Fantasia M.D.   On: 11/28/2018 05:59   Dg Femur, Min 2 Views Right  Result Date: 11/29/2018 CLINICAL DATA:  Right hip ORIF EXAM: RIGHT FEMUR 2 VIEWS COMPARISON:  11/28/2018 FINDINGS: The patient has undergone placement of a intramedullary nail through the proximal right femur. The alignment appears improved. There is no unexpected postoperative finding. IMPRESSION: Status post ORIF of the right hip. Electronically Signed   By: Constance Holster M.D.   On: 11/29/2018 16:00    Microbiology: Recent Results (from the past 240 hour(s))  SARS Coronavirus 2 (CEPHEID - Performed in [place] hospital lab), Hosp Order     Status: None   Collection Time: 11/28/18  6:04 AM  Result Value Ref Range Status   SARS Coronavirus 2 NEGATIVE NEGATIVE Final    Comment: (NOTE) If result is NEGATIVE SARS-CoV-2 target nucleic acids are NOT DETECTED. The SARS-CoV-2 RNA is generally detectable in upper and lower  respiratory specimens during the acute phase of infection. The lowest  concentration of SARS-CoV-2 viral copies this assay can detect is 250  copies / mL. A negative result does not preclude SARS-CoV-2 infection  and should not be used as the sole basis for treatment or other  patient management decisions.  A negative result may occur with  improper specimen collection / handling, submission of specimen other  than nasopharyngeal swab, presence of viral mutation(s) within the  areas targeted by this assay, and inadequate number  of viral copies  (<250 copies / mL). A negative result must be combined with clinical  observations, patient history, and epidemiological information. If result is POSITIVE SARS-CoV-2 target nucleic acids are DETECTED. The SARS-CoV-2 RNA is generally detectable in upper and lower  respiratory specimens dur ing the acute phase of infection.  Positive  results are indicative of active infection with SARS-CoV-2.  Clinical  correlation with patient history and other diagnostic information is  necessary to determine patient infection status.  Positive results do  not rule out bacterial infection or co-infection with other viruses. If result is PRESUMPTIVE POSTIVE SARS-CoV-2 nucleic acids MAY BE PRESENT.   A presumptive positive result was obtained on the submitted specimen  and confirmed on repeat testing.  While 2019 novel coronavirus  (SARS-CoV-2) nucleic acids may be present in the submitted sample  additional confirmatory testing may be necessary for epidemiological  and / or clinical management purposes  to differentiate between  SARS-CoV-2 and other Sarbecovirus currently known to infect humans.  If clinically indicated additional testing with an alternate test  methodology [phone]) is advised. The SARS-CoV-2 RNA is generally  detectable in upper and lower respiratory sp ecimens during the acute  phase of infection. The expected result is Negative. Fact Sheet for Patients:  StrictlyIdeas.no Fact Sheet for Healthcare Providers: BankingDealers.co.za This test is not yet approved or cleared by the [place] FDA and has been authorized for detection and/or diagnosis of SARS-CoV-2 by FDA under an Emergency Use Authorization (EUA).  This EUA will remain in effect (meaning this test can be used) for the duration of the COVID-19 declaration under Section 564(b)(1) of the Act, 21 U.S.C. section 360bbb-3(b)(1), unless the authorization is  terminated or revoked sooner. Performed at [facility], [address] [place], [place] [address]   Surgical pcr screen     Status: None   Collection Time: 11/28/18  9:43 PM  Result Value Ref Range Status   MRSA, PCR NEGATIVE NEGATIVE Final   Staphylococcus aureus NEGATIVE NEGATIVE Final    Comment: (NOTE) The Xpert SA Assay (FDA approved for NASAL specimens in patients 66 years of age and older), is one component of a comprehensive surveillance program. It is not intended to diagnose infection nor to guide or monitor treatment. Performed at [facility], [place] [address] [place], [place] [address]      Labs: Basic Metabolic Panel: Recent Labs  Lab 11/30/18 0410 12/02/18 0422 12/03/18 0442 12/04/18 0354 12/05/18 0756  NA 135 139 139 138 139  K 4.7 4.3 4.0 3.8 4.2  CL 98 101 98 99 101  CO2 27 29 32 28 35*  GLUCOSE 149* 106* 115* 115* 94  BUN 26* 34* 20 14 12   CREATININE 1.66* 1.03* 0.85 0.73 0.82  CALCIUM 9.3 9.5 9.7 9.5 9.1  MG 1.6* 2.0 1.6*  --   --    Liver Function Tests: No results for input(s): AST, ALT, ALKPHOS, BILITOT, PROT, ALBUMIN in the last 168 hours. No results for input(s): LIPASE, AMYLASE in the last 168 hours. No results for input(s): AMMONIA in the last 168 hours. CBC: Recent Labs  Lab 11/30/18 0410 12/02/18 0422 12/03/18 0442 12/04/18 0354 12/05/18 0756  WBC 9.0 8.9 9.4 7.7 7.2  HGB 10.6* 10.4* 10.6* 10.7* 10.0*  HCT 34.9* 33.5* 34.0* 34.3* 32.3*  MCV 85.5 83.5 83.1 82.3 83.2  PLT 192 197 235 205 224   Cardiac Enzymes: No results for input(s): CKTOTAL, CKMB, CKMBINDEX, TROPONINI in the last 168 hours. BNP: BNP (last 3 results) No results for input(s): BNP in the last 8760 hours.  ProBNP (last 3 results) No results for input(s): PROBNP in the last 8760 hours.  CBG: No results for input(s): GLUCAP in the last 168 hours.     Signed:  Desiree Hane, MD [facility] 12/05/2018, 12:47 PM

## 2018-12-05 NOTE — Progress Notes (Signed)
Physical Therapy Treatment Patient Details Name: Kristy Mckinney MRN: [id number] DOB: 10-Nov-1931 Today's Date: 12/05/2018    History of Present Illness 85 yo recently admitted to [facility] 5/17-5/20 with afib. [facility] 5/21 with resultant left hip fx s/p IM nail 5/22. PMhx: Afib, anxiety/depression, CHf, chronic back pain    PT Comments    Pt pleasant, slightly confused but wanting to move and get OOB. Pt able to perform HEp, toilet in bathroom and walk in hall today. Pt not oriented to day and wanting to know where her kids are. Pt following all commands and does not recall periods of hallucinations and severe confusion. Pt educated for HEp and gait with progression this session. SNF remains appropriate and will continue to follow.   HR 125-141 with gait    Follow Up Recommendations  SNF;Supervision/Assistance - 24 hour     Equipment Recommendations  None recommended by PT    Recommendations for Other Services       Precautions / Restrictions Precautions Precautions: Fall Restrictions RLE Weight Bearing: Weight bearing as tolerated    Mobility  Bed Mobility Overal bed mobility: Needs Assistance Bed Mobility: Supine to Sit     Supine to sit: Min assist     General bed mobility comments: min assist to pivot to EOB and elevate trunk with increased time, HOB 15 degrees  Transfers Overall transfer level: Needs assistance   Transfers: Sit to/from Stand Sit to Stand: Min assist         General transfer comment: min assist to stand from bed and toilet with cues for hand placement and safety, increased time for transitions  Ambulation/Gait Ambulation/Gait assistance: Min assist Gait Distance (Feet): 110 Feet Assistive device: Rolling walker (2 wheeled) Gait Pattern/deviations: Step-through pattern;Decreased stride length;Trunk flexed   Gait velocity interpretation: <1.8 ft/sec, indicate of risk for recurrent falls General Gait Details: cues for posture, sequence,  position in RW and direction. Pt with tendency to step past wheels despite cues, no awareness of room number   Stairs             Wheelchair Mobility    Modified Rankin (Stroke Patients Only)       Balance Overall balance assessment: Needs assistance;History of Falls Sitting-balance support: Feet supported;No upper extremity supported Sitting balance-Leahy Scale: Good Sitting balance - Comments: supervision for sitting EOB and at toilet   Standing balance support: During functional activity;Bilateral upper extremity supported Standing balance-Leahy Scale: Fair Standing balance comment: pt able to stand at sink to wash hands without UE support, bil UE support for gait                            Cognition Arousal/Alertness: Awake/alert Behavior During Therapy: WFL for tasks assessed/performed Overall Cognitive Status: Impaired/Different from baseline Area of Impairment: Memory;Problem solving;Orientation                 Orientation Level: Disoriented to;Time   Memory: Decreased short-term memory Following Commands: Follows one step commands consistently       General Comments: pt very pleasant and following commands today. pt does not recall her periods of confusion but is aware that she has had them and is apologetic      Exercises General Exercises - Lower Extremity Long Arc Quad: AROM;Seated;Right;15 reps Heel Slides: AROM;Supine;Right;15 reps Hip Flexion/Marching: AAROM;Seated;Right;15 reps    General Comments        Pertinent Vitals/Pain Pain Assessment: 0-10 Pain Score: 5  Pain Location: Rt hip with movement Pain Descriptors / Indicators: Aching;Sore Pain Intervention(s): Limited activity within patient's tolerance;Monitored during session;Repositioned    Home Living                      Prior Function            PT Goals (current goals can now be found in the care plan section) Progress towards PT goals: Progressing  toward goals    Frequency           PT Plan Current plan remains appropriate    Co-evaluation              AM-PAC PT "6 Clicks" Mobility   Outcome Measure  Help needed turning from your back to your side while in a flat bed without using bedrails?: A Little Help needed moving from lying on your back to sitting on the side of a flat bed without using bedrails?: A Little Help needed moving to and from a bed to a chair (including a wheelchair)?: A Little Help needed standing up from a chair using your arms (e.g., wheelchair or bedside chair)?: A Little Help needed to walk in hospital room?: A Little Help needed climbing 3-5 steps with a railing? : A Lot 6 Click Score: 17    End of Session Equipment Utilized During Treatment: Gait belt Activity Tolerance: Patient tolerated treatment well Patient left: in chair;with call bell/phone within reach;with chair alarm set Nurse Communication: Mobility status;Precautions PT Visit Diagnosis: Other abnormalities of gait and mobility (R26.89);Muscle weakness (generalized) (M62.81);Difficulty in walking, not elsewhere classified (R26.2)     Time: 0962-8366 PT Time Calculation (min) (ACUTE ONLY): 23 min  Charges:  $Gait Training: 8-22 mins $Therapeutic Exercise: 8-22 mins                     [place], PT Acute Rehabilitation Services Pager: [phone] Office: [place] 12/05/2018, 11:39 AM

## 2018-12-05 NOTE — Progress Notes (Signed)
Report called to Windell Norfolk, RN at [facility] for transfer today. PTAR arranged per CSW.

## 2018-12-05 NOTE — Care Management Important Message (Signed)
Important Message  Patient Details  Name: Kristy Mckinney MRN: [id number] Date of Birth: 02-06-1932   Medicare Important Message Given:  Yes    Orbie Pyo 12/05/2018, 4:27 PM

## 2018-12-05 NOTE — Consult Note (Signed)
   [facility] CM Inpatient Consult   12/05/2018  Kristy Mckinney [birth year] [id number]   Patient screened for within 7 day  Re-hospitalizations to check if potential [facility] services are needed. Patient is in the [employer] plan PPO.  Review of patient's medical record from MD notes reveals patient is: Kristy Mckinney is a 85 y.o. year old female with medical history significant for for GERD, recently diagnosed atrial fibrillation (11/24/2018) on Eliquis who presented on 11/28/2018 any pain hospital with unwitnessed fall presumed mechanical in nature and was found to have rightintertrochanteric hip fracture requiring transfer to [facility] for orthopedic repair (5/22).  Hospital course complicated by A. fib with RVR on presentation and recurrence overnight on 5/26.  Chart review reveals patient is for skilled nursing facility.  Primary Care Provider is [name]  Plan:  Patient is to transition to [facility]. No current [facility] follow up at the facility.  For questions contact:   Natividad Brood, RN BSN [facility] Liaison  [phone] business mobile phone Toll free office [phone]  Fax number: [phone] [email or web address]

## 2018-12-06 ENCOUNTER — Encounter: Payer: Self-pay | Admitting: Adult Health

## 2018-12-06 ENCOUNTER — Non-Acute Institutional Stay (SKILLED_NURSING_FACILITY): Payer: PPO | Admitting: Adult Health

## 2018-12-06 ENCOUNTER — Other Ambulatory Visit: Payer: Self-pay | Admitting: Adult Health

## 2018-12-06 DIAGNOSIS — I5031 Acute diastolic (congestive) heart failure: Secondary | ICD-10-CM | POA: Diagnosis not present

## 2018-12-06 DIAGNOSIS — M810 Age-related osteoporosis without current pathological fracture: Secondary | ICD-10-CM | POA: Diagnosis not present

## 2018-12-06 DIAGNOSIS — R410 Disorientation, unspecified: Secondary | ICD-10-CM | POA: Insufficient documentation

## 2018-12-06 DIAGNOSIS — I4891 Unspecified atrial fibrillation: Secondary | ICD-10-CM

## 2018-12-06 DIAGNOSIS — K5909 Other constipation: Secondary | ICD-10-CM

## 2018-12-06 DIAGNOSIS — F329 Major depressive disorder, single episode, unspecified: Secondary | ICD-10-CM | POA: Diagnosis not present

## 2018-12-06 DIAGNOSIS — S72141S Displaced intertrochanteric fracture of right femur, sequela: Secondary | ICD-10-CM | POA: Diagnosis not present

## 2018-12-06 DIAGNOSIS — D62 Acute posthemorrhagic anemia: Secondary | ICD-10-CM | POA: Insufficient documentation

## 2018-12-06 DIAGNOSIS — F419 Anxiety disorder, unspecified: Secondary | ICD-10-CM

## 2018-12-06 DIAGNOSIS — K219 Gastro-esophageal reflux disease without esophagitis: Secondary | ICD-10-CM | POA: Diagnosis not present

## 2018-12-06 MED ORDER — DIPHENOXYLATE-ATROPINE 2.5-0.025 MG PO TABS: 1.0000 | ORAL_TABLET | Freq: Four times a day (QID) | ORAL | 0 refills | Status: DC | PRN

## 2018-12-06 NOTE — Progress Notes (Signed)
Location:   [place] Room Number: 157 D Place of Service:  SNF (31)   CODE STATUS: Full Code  Allergies  Allergen Reactions   Penicillins Anaphylaxis   Feldene [Piroxicam] Nausea And Vomiting   Sulfa Antibiotics     swelling    Chief Complaint  Patient presents with   Hospitalization [facility] follow up    HPI:  She is a 85 year old woman who sustained a presumed mechanical fall and suffered a right intertrochanteric fracture. She has recently been diagnosed with afib and is being treated with eliquis. She is a poor historian; but denies any changes in her appetite; denies any insomnia constipation. Denies any uncontrolled pain. No reports of fevers present. Will continue to be followed for her chronic illnesses including: afib; chf; gerd   Past Medical History:  Diagnosis Date   Chronic back pain    Diarrhea    Dysphagia 05/03/2016   GERD (gastroesophageal reflux disease)     Past Surgical History:  Procedure Laterality Date   ABDOMINAL HYSTERECTOMY     BACK SURGERY     CHOLECYSTECTOMY N/A 01/29/2014   Procedure: LAPAROSCOPIC CHOLECYSTECTOMY;  Surgeon: Scherry Ran, MD;  Location: [facility] ORS;  Service: General;  Laterality: N/A;   COLONOSCOPY N/A 10/27/2015   Procedure: COLONOSCOPY;  Surgeon: Rogene Houston, MD;  Location: [facility] ENDO [facility];  Service: Endoscopy;  Laterality: N/A;  215   INTRAMEDULLARY (IM) NAIL INTERTROCHANTERIC Right 11/29/2018   Procedure: INTRAMEDULLARY (IM) NAIL INTERTROCHANTRIC FRACTURE;  Surgeon: Renette Butters, MD;  Location: [place];  Service: Orthopedics;  Laterality: Right;   TONSILLECTOMY     YAG LASER APPLICATION Left 29/93/7169   Procedure: YAG LASER APPLICATION;  Surgeon: Elta Guadeloupe T. Gershon Crane, MD;  Location: [facility] ORS;  Service: Ophthalmology;  Laterality: Left;  left    Social History   Socioeconomic History   Marital status: Married    Spouse name: Not on file   Number of  children: Not on file   Years of education: Not on file   Highest education level: Not on file  Occupational History   Not on file  Social Needs   Financial resource strain: Not on file   Food insecurity:    Worry: Not on file    Inability: Not on file   Transportation needs:    Medical: Not on file    Non-medical: Not on file  Tobacco Use   Smoking status: Never Smoker   Smokeless tobacco: Never Used  Substance and Sexual Activity   Alcohol use: No   Drug use: No   Sexual activity: Yes    Birth control/protection: Surgical  Lifestyle   Physical activity:    Days per week: Not on file    Minutes per session: Not on file   Stress: Not on file  Relationships   Social connections:    Talks on phone: Not on file    Gets together: Not on file    Attends religious service: Not on file    Active member of club or organization: Not on file    Attends meetings of clubs or organizations: Not on file    Relationship status: Not on file   Intimate partner violence:    Fear of current or ex partner: Not on file    Emotionally abused: Not on file    Physically abused: Not on file    Forced sexual activity: Not on file  Other Topics  Concern   Not on file  Social History Narrative   Not on file   History reviewed. No pertinent family history.    VITAL SIGNS BP (!) 101/52    Pulse 92    Temp 98.7 F (37.1 C)    Resp 20    Ht 5\' 5"  (1.651 m)    Wt 143 lb (64.9 kg)    BMI 23.80 kg/m   Outpatient Encounter Medications as of 12/06/2018  Medication Sig   acetaminophen (TYLENOL) 325 MG tablet Take 1-2 tablets (325-650 mg total) by mouth every 6 (six) hours as needed for mild pain (pain score 1-3 or temp > 100.5).   alendronate (FOSAMAX) 70 MG tablet Take 1 tablet (70 mg total) by mouth once a week. Take with a full glass of water on an empty stomach.   apixaban (ELIQUIS) 2.5 MG TABS tablet Take 1 tablet (2.5 mg total) by mouth 2 (two) times daily.    calcium-vitamin D (OS-CAL 500 + D) 500-200 MG-UNIT tablet Take 1 tablet by mouth 2 (two) times daily.   diltiazem (CARDIZEM CD) 240 MG 24 hr capsule Take 1 capsule (240 mg total) by mouth daily.   diphenoxylate-atropine (LOMOTIL) 2.5-0.025 MG tablet Take 1 tablet by mouth 4 (four) times daily as needed for diarrhea or loose stools.   docusate sodium (COLACE) 100 MG capsule Take 1 capsule (100 mg total) by mouth 2 (two) times daily.   feeding supplement, ENSURE ENLIVE, (ENSURE ENLIVE) LIQD Take 237 mLs by mouth 2 (two) times daily between meals.   HYDROcodone-acetaminophen (NORCO) 5-325 MG tablet Take 1-2 tablets by mouth every 6 (six) hours as needed for severe pain.   magnesium oxide (MAG-OX) 400 (241.3 Mg) MG tablet Take 1 tablet (400 mg total) by mouth 2 (two) times daily.   Multiple Vitamin (MULTIVITAMIN WITH MINERALS) TABS tablet Take 1 tablet by mouth daily.   Multiple Vitamins-Minerals (HAIR SKIN AND NAILS FORMULA) TABS Take 1 tablet by mouth daily.   NON FORMULARY Diet Type: NAS   omeprazole (PRILOSEC) 20 MG capsule Take 1 capsule (20 mg total) by mouth every morning.   Polyethyl Glycol-Propyl Glycol (SYSTANE OP) Apply 1 drop to eye daily as needed. Dry Eyes   polyethylene glycol (MIRALAX / GLYCOLAX) 17 g packet Take 17 g by mouth daily as needed for mild constipation.   QUEtiapine (SEROQUEL) 25 MG tablet Take 1 tablet (25 mg total) by mouth at bedtime.   sertraline (ZOLOFT) 50 MG tablet Take 1 tablet (50 mg total) by mouth every morning.   [DISCONTINUED] Biotin w/ Vitamins C & E (HAIR/SKIN/NAILS PO) Take 1 tablet by mouth daily.   [DISCONTINUED] loperamide (IMODIUM) 2 MG capsule Take 1 capsule (2 mg total) by mouth as needed for diarrhea or loose stools. (Patient not taking: Reported on 12/06/2018)   No facility-administered encounter medications on file as of 12/06/2018.      SIGNIFICANT DIAGNOSTIC EXAMS  TODAY:   11-24-18: chest x-ray:  1. Cardiac enlargement,  aortic atherosclerosis and congestive heart failure. 2.  Aortic Atherosclerosis   11-25-18: ct of head: Mild atrophy with patchy periventricular small vessel disease. No evident acute infarct. No mass or hemorrhage. Foci of arterial vascular calcification noted.  11-25-18: 2-d echo:  1. The left ventricle has hyperdynamic systolic function, with an ejection fraction of >65%. The cavity size was normal. Left ventricular diastolic Doppler parameters are indeterminate.  2. The right ventricle has moderately reduced systolic function. The cavity was mildly enlarged. There is no increase  in right ventricular wall thickness.  3. Left atrial size was severely dilated.  4. Right atrial size was severely dilated.  5. The aortic valve is tricuspid. Mild thickening of the aortic valve. Moderate calcification of the aortic valve. Mild to moderate aortic annular calcification noted.  6. The mitral valve is abnormal. Moderate thickening of the mitral valve leaflet. Moderate calcification of the mitral valve leaflet. There is moderate mitral annular calcification present. Mild mitral valve stenosis.  7. MV is thickened, calcified with very mildly restricted motion.  8. The tricuspid valve is grossly normal. Tricuspid valve regurgitation is moderate.  9. The aortic root is normal in size and structure. 10. The inferior vena cava was dilated in size with <50% respiratory variability. 11. The interatrial septum was not assessed.  11-28-18: right hip x-ray: Probable nondisplaced intertrochanteric femur fracture on the right. Would confirm with CT prior to any surgery.  11-28-18: ct of head and cervical spine: 1. No evidence of intracranial injury or cervical spine fracture. 2. Cervical spine degeneration with notable calcified disc herniation at C5-6 that impinges on the cord.  11-28-18: ct of right hip: Nondisplaced intertrochanteric right femur fracture.  LABS REVIEWED TODAY:   11-24-18: wbc 6.9; hgb 12.5;  hct 40.0; mcv 85.3; plt 256; glucose 102; bun 18; creat 0.82; k+ 3.7; na++ 142; ca 9.5 liver normal albumin 4.1 11-25-18: glucose 95; bun 14; creat 0.71; k+ 3.4; na++ 141; ca 8.9 mag 1.5 tsh 1.615 11-29-18: wbc 10.3; hgb 10.9; hct 35.0; mcv 84.3 plt 241 glucose 109; bun 17; creat 1.06 ;k+ 4.4; na++ 139  12-04-18: wbc 7.7; hgb 10.7; hct 34.3; mcv 92.3 plt 205 glucose 115; bun 14; creat 0.73; k+ 3.8; na++ 138; ca 9.5 12-05-18: wbc 7.2; hgb 10.0; hct 32.3; mcv 83.2; plt 224; glucose 94; bun 12; creat 0.82; k+ 4.2; na++ 139; ca 9.1    Review of Systems  Reason unable to perform ROS: poor historian   Constitutional: Negative for malaise/fatigue.  Respiratory: Negative for cough and shortness of breath.   Cardiovascular: Negative for chest pain and leg swelling.  Gastrointestinal: Negative for abdominal pain and constipation.  Musculoskeletal: Positive for joint pain. Negative for back pain and myalgias.       Right hip pain is managed   Skin: Negative.   Psychiatric/Behavioral: The patient is not nervous/anxious.     Physical Exam Constitutional:      General: She is not in acute distress.    Appearance: She is well-developed. She is not diaphoretic.     Comments: thin  Neck:     Musculoskeletal: Neck supple.     Thyroid: No thyromegaly.  Cardiovascular:     Rate and Rhythm: Normal rate and regular rhythm.     Pulses: Normal pulses.     Heart sounds: Normal heart sounds.  Pulmonary:     Effort: Pulmonary effort is normal. No respiratory distress.     Breath sounds: Normal breath sounds.  Abdominal:     General: Bowel sounds are normal. There is no distension.     Palpations: Abdomen is soft.     Tenderness: There is no abdominal tenderness.  Musculoskeletal:     Right lower leg: No edema.     Left lower leg: No edema.     Comments: Is able to move all extremities Is status post right intertrochanteric IM nailing Has trace bilateral lower extremity edema   Lymphadenopathy:      Cervical: No cervical adenopathy.  Skin:  General: Skin is warm and dry.     Comments: Right hip incision line without signs of infection present.   Neurological:     Mental Status: She is alert. Mental status is at baseline.  Psychiatric:        Mood and Affect: Mood normal.        ASSESSMENT/ PLAN:  TODAY   1. Atrial fibrillation with RVR: heart rate is stable: will continue cardizem cd 240 mg daily for rate control; will continue eliquis 2.5 mg twice daily   2. Acute diastolic CHF( congestive heart failure) is stable EF >65% (11-28-18): will continue to monitor her status.   3. GERD without esophagitis: is stable will continue prilosec 20 mg daily   4. Closed displaced intertrochanteric fracture of right femur sequela: is stable will continue therapy as directed and will follow up with orthopedics will continue vicodin 5/325 mg 1 or 2 tabs every 4 hours as needed for pain will continue ensure twice daily for nutritional support   5. Chronic constipation: is stable will continue colace twice daily and miralax daily as needed   6. Post menopausal osteoporosis: is status recent right hip fracture: will continue fosamax 70 mg weekly and supplements  7. Acute delirium: is stable will continue seroquel 25 mg nightly will need to consider weaning off over the next couple weeks.   8. Anxiety and depression: is stable will continue zoloft 50 mg daily  9. Hypomagnesemia: is without change will continue magox 400 mg twice daily will repeat mag level.  10. Acute blood loss anemia: is without change hgb 10.0 will monitor   Will check cbc mag level 12-10-18      MD is aware of resident's narcotic use and is in agreement with current plan of care. We will attempt to wean resident as apropriate   Ok Edwards NP [facility]  Contact [phone] Monday through Friday 8am- 5pm  After hours call [phone]

## 2018-12-09 ENCOUNTER — Non-Acute Institutional Stay (SKILLED_NURSING_FACILITY): Payer: PPO | Admitting: Internal Medicine

## 2018-12-09 ENCOUNTER — Encounter: Payer: Self-pay | Admitting: Internal Medicine

## 2018-12-09 DIAGNOSIS — F329 Major depressive disorder, single episode, unspecified: Secondary | ICD-10-CM

## 2018-12-09 DIAGNOSIS — I4891 Unspecified atrial fibrillation: Secondary | ICD-10-CM | POA: Diagnosis not present

## 2018-12-09 DIAGNOSIS — S72141S Displaced intertrochanteric fracture of right femur, sequela: Secondary | ICD-10-CM

## 2018-12-09 DIAGNOSIS — I5031 Acute diastolic (congestive) heart failure: Secondary | ICD-10-CM

## 2018-12-09 DIAGNOSIS — F0391 Unspecified dementia with behavioral disturbance: Secondary | ICD-10-CM

## 2018-12-09 DIAGNOSIS — F419 Anxiety disorder, unspecified: Secondary | ICD-10-CM

## 2018-12-09 DIAGNOSIS — M810 Age-related osteoporosis without current pathological fracture: Secondary | ICD-10-CM | POA: Diagnosis not present

## 2018-12-09 NOTE — Progress Notes (Signed)
Provider:  Veleta Miners, MD Location:  [facility] Room Number: 157 D Place of Service:  SNF (31)  PCP: [name], MD Patient Care Team: [name], MD as PCP - General (Internal Medicine)  Extended Emergency Contact Information Primary Emergency Contact: Dattero,[name] Address: [address]          [place], [place] [address] Kristy Mckinney of [place] Phone: [phone] Work Phone: [phone] Mobile Phone: [phone] Relation: Daughter Secondary Emergency Contact: Corena Pilgrim, [place] [address] Kristy Mckinney of [employer] Phone: [phone] Relation: Daughter  Code Status: Full Code Goals of Care: Advanced Directive information Advanced Directives 12/09/2018  Does Patient Have a Medical Advance Directive? No  Would patient like information on creating a medical advance directive? No - Patient declined  Pre-existing out of facility DNR order (yellow form or pink MOST form) -      Chief Complaint  Patient presents with   New Admit To SNF    Admission    HPI: Patient is a 85 y.o. female seen today for admission to SNF for therapy Patient was admitted to Hospital from 5/21-5/28 for Right Intertrochanteric Hip Fracture  She has h/o Atrial Fibrillation diagnosed recently , osteoporosis He was discharged from the hospital on 05/20 after diagnosis on New Onset Atrial Fibrillation with Rapid Ventricular response.  She was sent home on diltiazem and Eliquis But next day at home patient tripped and fell.  In the ED she was diagnosed with nondisplaced intertrochanteric right hip fracture.  She was transferred to [facility]. She went ORIF on 5/22.. Patient did have some cognitive impairment and then delirium postop.  She was started on Seroquel and is now on a low-dose at night. Patient is now in SNF for therapy. She did not have any acute complaints today.  She she states she does have mild pain but it is bearable.  She is  working with therapy and is already walking few steps with walker. Per nurses patient does get very confused in the evenings. Patient lives by herself and was driving.  She has 2 daughters who come and visit her help her with her cooking Past Medical History:  Diagnosis Date   Chronic back pain    Diarrhea    Dysphagia 05/03/2016   GERD (gastroesophageal reflux disease)    Past Surgical History:  Procedure Laterality Date   ABDOMINAL HYSTERECTOMY     BACK SURGERY     CHOLECYSTECTOMY N/A 01/29/2014   Procedure: LAPAROSCOPIC CHOLECYSTECTOMY;  Surgeon: Scherry Ran, MD;  Location: [facility];  Service: General;  Laterality: N/A;   COLONOSCOPY N/A 10/27/2015   Procedure: COLONOSCOPY;  Surgeon: Rogene Houston, MD;  Location: [facility];  Service: Endoscopy;  Laterality: N/A;  215   INTRAMEDULLARY (IM) NAIL INTERTROCHANTERIC Right 11/29/2018   Procedure: INTRAMEDULLARY (IM) NAIL INTERTROCHANTRIC FRACTURE;  Surgeon: Renette Butters, MD;  Location: [facility];  Service: Orthopedics;  Laterality: Right;   TONSILLECTOMY     YAG LASER APPLICATION Left 47/42/5956   Procedure: YAG LASER APPLICATION;  Surgeon: Elta Guadeloupe T. Gershon Crane, MD;  Location: [facility];  Service: Ophthalmology;  Laterality: Left;  left    reports that she has never smoked. She has never used smokeless tobacco. She reports that she does not drink alcohol or use drugs. Social History   Socioeconomic History   Marital status: Married    Spouse name: Not on file   Number of children: Not on  file   Years of education: Not on file   Highest education level: Not on file  Occupational History   Not on file  Social Needs   Financial resource strain: Not on file   Food insecurity:    Worry: Not on file    Inability: Not on file   Transportation needs:    Medical: Not on file    Non-medical: Not on file  Tobacco Use   Smoking status: Never Smoker   Smokeless tobacco: Never Used  Substance and Sexual Activity    Alcohol use: No   Drug use: No   Sexual activity: Yes    Birth control/protection: Surgical  Lifestyle   Physical activity:    Days per week: Not on file    Minutes per session: Not on file   Stress: Not on file  Relationships   Social connections:    Talks on phone: Not on file    Gets together: Not on file    Attends religious service: Not on file    Active member of club or organization: Not on file    Attends meetings of clubs or organizations: Not on file    Relationship status: Not on file   Intimate partner violence:    Fear of current or ex partner: Not on file    Emotionally abused: Not on file    Physically abused: Not on file    Forced sexual activity: Not on file  Other Topics Concern   Not on file  Social History Narrative   Not on file    Functional Status Survey:    History reviewed. No pertinent family history.  Health Maintenance  Topic Date Due   PNA vac Low Risk Adult (2 of 2 - PCV13) 01/06/2019 (Originally 05/18/2017)   INFLUENZA VACCINE  02/08/2019   TETANUS/TDAP  01/16/2022   DEXA SCAN  Completed    Allergies  Allergen Reactions   Penicillins Anaphylaxis   Emetrol Nausea Only    swelling   Feldene [Piroxicam] Nausea And Vomiting   Sulfa Antibiotics     swelling    Outpatient Encounter Medications as of 12/09/2018  Medication Sig   acetaminophen (TYLENOL) 325 MG tablet Take 650 mg by mouth every 6 (six) hours as needed.   alendronate (FOSAMAX) 70 MG tablet Take 1 tablet (70 mg total) by mouth once a week. Take with a full glass of water on an empty stomach.   apixaban (ELIQUIS) 2.5 MG TABS tablet Take 1 tablet (2.5 mg total) by mouth 2 (two) times daily.   Balsam [place]-Castor Oil (VENELEX) OINT Apply topically to sacrum and bilateral buttocks every shift and as needed for prevention   calcium-vitamin D (OS-CAL 500 + D) 500-200 MG-UNIT tablet Take 1 tablet by mouth 2 (two) times daily.   diltiazem (CARDIZEM CD) 240 MG 24  hr capsule Take 1 capsule (240 mg total) by mouth daily.   diphenoxylate-atropine (LOMOTIL) 2.5-0.025 MG tablet Take 1 tablet by mouth 4 (four) times daily as needed for diarrhea or loose stools.   docusate sodium (COLACE) 100 MG capsule Take 1 capsule (100 mg total) by mouth 2 (two) times daily.   feeding supplement, ENSURE ENLIVE, (ENSURE ENLIVE) LIQD Take 237 mLs by mouth 2 (two) times daily between meals.   HYDROcodone-acetaminophen (NORCO) 5-325 MG tablet Take 1-2 tablets by mouth every 6 (six) hours as needed for severe pain.   magnesium oxide (MAG-OX) 400 (241.3 Mg) MG tablet Take 1 tablet (400 mg total) by mouth  2 (two) times daily.   Multiple Vitamin (MULTIVITAMIN WITH MINERALS) TABS tablet Take 1 tablet by mouth daily.   Multiple Vitamins-Minerals (HAIR SKIN AND NAILS FORMULA) TABS Take 1 tablet by mouth daily.   NON FORMULARY Diet Type: NAS   omeprazole (PRILOSEC) 20 MG capsule Take 1 capsule (20 mg total) by mouth every morning.   Ostomy Supplies (SKIN PREP WIPES) MISC Apply to bilateral heels every shift for prevention   Polyethyl Glycol-Propyl Glycol (SYSTANE OP) Apply 1 drop to eye daily as needed. Dry Eyes   polyethylene glycol (MIRALAX / GLYCOLAX) 17 g packet Take 17 g by mouth daily as needed for mild constipation.   QUEtiapine (SEROQUEL) 25 MG tablet Take 1 tablet (25 mg total) by mouth at bedtime.   sertraline (ZOLOFT) 50 MG tablet Take 1 tablet (50 mg total) by mouth every morning.   [DISCONTINUED] acetaminophen (TYLENOL) 325 MG tablet Take 1-2 tablets (325-650 mg total) by mouth every 6 (six) hours as needed for mild pain (pain score 1-3 or temp > 100.5). (Patient not taking: Reported on 12/09/2018)   No facility-administered encounter medications on file as of 12/09/2018.     Review of Systems  Review of Systems  Constitutional: Negative for activity change, appetite change, chills, diaphoresis, fatigue and fever.  HENT: Negative for mouth sores, postnasal  drip, rhinorrhea, sinus pain and sore throat.   Respiratory: Negative for apnea, cough, chest tightness, shortness of breath and wheezing.   Cardiovascular: Negative for chest pain, palpitations and leg swelling.  Gastrointestinal: Negative for abdominal distention, abdominal pain, constipation, diarrhea, nausea and vomiting.  Genitourinary: Negative for dysuria and frequency.  Musculoskeletal: Negative for arthralgias, joint swelling and myalgias.  Skin: Negative for rash.  Neurological: Negative for dizziness, syncope, weakness, light-headedness and numbness.  Psychiatric/Behavioral: Negative for behavioral problems, confusion and sleep disturbance.     Vitals:   12/09/18 1059  BP: 124/73  Pulse: 79  Resp: 20  Temp: 97.9 F (36.6 C)  Weight: 140 lb 3.2 oz (63.6 kg)  Height: 5\' 5"  (1.651 m)   Body mass index is 23.33 kg/m. Physical Exam  Constitutional: Oriented to person, place, and time. Well-developed and well-nourished.  HENT:  Head: Normocephalic.  Mouth/Throat: Oropharynx is clear and moist.  Eyes: Pupils are equal, round, and reactive to light.  Neck: Neck supple.  Cardiovascular: Normal rate and normal heart sounds.  No murmur heard. Pulmonary/Chest: Effort normal and breath sounds normal. No respiratory distress. No wheezes. She has no rales.  Abdominal: Soft. Bowel sounds are normal. No distension. There is no tenderness. There is no rebound.  Musculoskeletal: No edema.  Lymphadenopathy: none Neurological: Alert and oriented to person, place, and time.  Skin: Skin is warm and dry.  Psychiatric: Normal mood and affect. Behavior is normal. Thought content normal.    Labs reviewed: Basic Metabolic Panel: Recent Labs    11/30/18 0410 12/02/18 0422 12/03/18 0442 12/04/18 0354 12/05/18 0756  NA 135 139 139 138 139  K 4.7 4.3 4.0 3.8 4.2  CL 98 101 98 99 101  CO2 27 29 32 28 35*  GLUCOSE 149* 106* 115* 115* 94  BUN 26* 34* 20 14 12   CREATININE 1.66* 1.03*  0.85 0.73 0.82  CALCIUM 9.3 9.5 9.7 9.5 9.1  MG 1.6* 2.0 1.6*  --   --    Liver Function Tests: Recent Labs    11/24/18 1135  AST 30  ALT 20  ALKPHOS 59  BILITOT 2.2*  PROT 7.1  ALBUMIN 4.1  No results for input(s): LIPASE, AMYLASE in the last 8760 hours. No results for input(s): AMMONIA in the last 8760 hours. CBC: Recent Labs    11/24/18 1135 11/28/18 0607  12/03/18 0442 12/04/18 0354 12/05/18 0756  WBC 6.9 8.1   < > 9.4 7.7 7.2  NEUTROABS 5.2 6.5  --   --   --   --   HGB 12.5 11.8*   < > 10.6* 10.7* 10.0*  HCT 40.0 38.8   < > 34.0* 34.3* 32.3*  MCV 85.3 85.8   < > 83.1 82.3 83.2  PLT 256 226   < > 235 205 224   < > = values in this interval not displayed.   Cardiac Enzymes: Recent Labs    11/24/18 1135  TROPONINI <0.03   BNP: Invalid input(s): POCBNP No results found for: HGBA1C Lab Results  Component Value Date   TSH 1.615 11/25/2018   No results found for: VITAMINB12 No results found for: FOLATE No results found for: IRON, TIBC, FERRITIN  Imaging and Procedures obtained prior to SNF admission: No results found.  Assessment/Plan Atrial fibrillation with RVR  Rate Controlled on Cardizem Also On Eliquis  Acute diastolic CHF (congestive heart failure)  Was treated with Lasix in the hospital  But has been Doing well since then  Was not discharged on lasix EF of more then 65%  Closed displaced intertrochanteric fracture of right femur WBAT Pain seems controlled on Tylenol Already walking with therapy  Anxiety and depression On Zoloft  Post-menopausal osteoporosis Continue on Fosamax      Family/ staff Communication:   Labs/tests ordered: Total time spent in this patient care encounter was  45_  minutes; greater than 50% of the visit spent counseling patient and staff, reviewing records , Labs and coordinating care for problems addressed at this encounter.

## 2018-12-10 ENCOUNTER — Encounter (HOSPITAL_COMMUNITY)
Admission: RE | Admit: 2018-12-10 | Discharge: 2018-12-10 | Disposition: A | Discharge status: Home / Self Care | Payer: PPO | Source: Skilled Nursing Facility | Attending: Internal Medicine | Admitting: Internal Medicine

## 2018-12-10 DIAGNOSIS — Z1159 Encounter for screening for other viral diseases: Secondary | ICD-10-CM | POA: Insufficient documentation

## 2018-12-10 DIAGNOSIS — S72144D Nondisplaced intertrochanteric fracture of right femur, subsequent encounter for closed fracture with routine healing: Secondary | ICD-10-CM | POA: Insufficient documentation

## 2018-12-10 DIAGNOSIS — Z4789 Encounter for other orthopedic aftercare: Secondary | ICD-10-CM | POA: Insufficient documentation

## 2018-12-10 DIAGNOSIS — Z7901 Long term (current) use of anticoagulants: Secondary | ICD-10-CM | POA: Insufficient documentation

## 2018-12-10 LAB — CBC
HCT: 35.6 % — ABNORMAL LOW (ref 36.0–46.0)
Hemoglobin: 10.8 g/dL — ABNORMAL LOW (ref 12.0–15.0)
MCH: 25.7 pg — ABNORMAL LOW (ref 26.0–34.0)
MCHC: 30.3 g/dL (ref 30.0–36.0)
MCV: 84.8 fL (ref 80.0–100.0)
Platelets: 380 10*3/uL (ref 150–400)
RBC: 4.2 MIL/uL (ref 3.87–5.11)
RDW: 17.1 % — ABNORMAL HIGH (ref 11.5–15.5)
WBC: 7.5 10*3/uL (ref 4.0–10.5)
nRBC: 0 % (ref 0.0–0.2)

## 2018-12-10 LAB — MAGNESIUM: Magnesium: 1.6 mg/dL — ABNORMAL LOW (ref 1.7–2.4)

## 2018-12-12 ENCOUNTER — Non-Acute Institutional Stay (SKILLED_NURSING_FACILITY): Payer: PPO | Admitting: Internal Medicine

## 2018-12-12 ENCOUNTER — Telehealth: Payer: PPO | Admitting: Student

## 2018-12-12 ENCOUNTER — Encounter: Payer: Self-pay | Admitting: Internal Medicine

## 2018-12-12 DIAGNOSIS — M810 Age-related osteoporosis without current pathological fracture: Secondary | ICD-10-CM | POA: Diagnosis not present

## 2018-12-12 DIAGNOSIS — I4891 Unspecified atrial fibrillation: Secondary | ICD-10-CM | POA: Diagnosis not present

## 2018-12-12 DIAGNOSIS — F0391 Unspecified dementia with behavioral disturbance: Secondary | ICD-10-CM | POA: Diagnosis not present

## 2018-12-12 DIAGNOSIS — F0390 Unspecified dementia without behavioral disturbance: Secondary | ICD-10-CM | POA: Insufficient documentation

## 2018-12-12 NOTE — Progress Notes (Signed)
Location:  [facility] Room Number: [place] of Service:  SNF [phone]) Provider:  Veleta Miners, MD  [name], MD  Patient Care Team: [name], MD as PCP - General (Internal Medicine)  Extended Emergency Contact Information Primary Emergency Contact: Dattero,[name] Address: [address]          [place], [place] [address] Johnnette Litter of [place] Phone: [phone] Work Phone: [phone] Mobile Phone: [phone] Relation: Daughter Secondary Emergency Contact: Corena Pilgrim, [place] [address] Johnnette Litter of [place] Phone: [phone] Relation: Daughter  Code Status:  Full Code Goals of care: Advanced Directive information Advanced Directives 12/12/2018  Does Patient Have a Medical Advance Directive? No  Would patient like information on creating a medical advance directive? No - Patient declined  Pre-existing out of facility DNR order (yellow form or pink MOST form) -     Chief Complaint  Patient presents with   Acute Visit    Lab follow up and Confusion    HPI:  Pt is a 85 y.o. female seen today for an acute visit for Confusion with Sundowning in Evening  Patient was admitted to Hospital from 5/21-5/28 for Right Intertrochanteric Hip Fracture  She has h/o Atrial Fibrillation diagnosed recently , osteoporosis He was discharged from the hospital on 05/20 after diagnosis on New Onset Atrial Fibrillation with Rapid Ventricular response.  She was sent home on diltiazem and Eliquis But next day at home patient tripped and fell.  In the ED she was diagnosed with nondisplaced intertrochanteric right hip fracture.  She was transferred to [facility]. She went ORIF on 5/22.. Patient did have some cognitive impairment and then delirium postop.  She was started on Seroquel and is now on a low-dose at night. Patient is now in SNF for therapy. Per Nurses they have noticed her to be confused in the evening. She Sometimes  sees People in her room.  But this morning she was alert and Oriented. Did nto remember anything about the fall.  Denied any Fever, Chest pain or Cough  Past Medical History:  Diagnosis Date   Chronic back pain    Diarrhea    Dysphagia 05/03/2016   GERD (gastroesophageal reflux disease)    Past Surgical History:  Procedure Laterality Date   ABDOMINAL HYSTERECTOMY     BACK SURGERY     CHOLECYSTECTOMY N/A 01/29/2014   Procedure: LAPAROSCOPIC CHOLECYSTECTOMY;  Surgeon: Scherry Ran, MD;  Location: [facility];  Service: General;  Laterality: N/A;   COLONOSCOPY N/A 10/27/2015   Procedure: COLONOSCOPY;  Surgeon: Rogene Houston, MD;  Location: [facility];  Service: Endoscopy;  Laterality: N/A;  215   INTRAMEDULLARY (IM) NAIL INTERTROCHANTERIC Right 11/29/2018   Procedure: INTRAMEDULLARY (IM) NAIL INTERTROCHANTRIC FRACTURE;  Surgeon: Renette Butters, MD;  Location: [place];  Service: Orthopedics;  Laterality: Right;   TONSILLECTOMY     YAG LASER APPLICATION Left 63/14/9702   Procedure: YAG LASER APPLICATION;  Surgeon: Elta Guadeloupe T. Gershon Crane, MD;  Location: [facility];  Service: Ophthalmology;  Laterality: Left;  left    Allergies  Allergen Reactions   Penicillins Anaphylaxis   Emetrol Nausea Only    swelling   Feldene [Piroxicam] Nausea And Vomiting   Sulfa Antibiotics     swelling    Outpatient Encounter Medications as of 12/12/2018  Medication Sig   acetaminophen (TYLENOL) 325 MG tablet Take 650 mg by mouth every 6 (six) hours as needed.  alendronate (FOSAMAX) 70 MG tablet Take 1 tablet (70 mg total) by mouth once a week. Take with a full glass of water on an empty stomach.   apixaban (ELIQUIS) 2.5 MG TABS tablet Take 1 tablet (2.5 mg total) by mouth 2 (two) times daily.   Balsam [place]-Castor Oil (VENELEX) OINT Apply topically to sacrum and bilateral buttocks every shift and as needed for prevention   calcium-vitamin D (OS-CAL 500 + D) 500-200 MG-UNIT tablet Take 1  tablet by mouth 2 (two) times daily.   diltiazem (CARDIZEM CD) 240 MG 24 hr capsule Take 1 capsule (240 mg total) by mouth daily.   diphenoxylate-atropine (LOMOTIL) 2.5-0.025 MG tablet Take 1 tablet by mouth 4 (four) times daily as needed for diarrhea or loose stools.   docusate sodium (COLACE) 100 MG capsule Take 1 capsule (100 mg total) by mouth 2 (two) times daily.   feeding supplement, ENSURE ENLIVE, (ENSURE ENLIVE) LIQD Take 237 mLs by mouth 2 (two) times daily between meals.   magnesium oxide (MAG-OX) 400 (241.3 Mg) MG tablet Take 1 tablet (400 mg total) by mouth 2 (two) times daily.   Multiple Vitamin (MULTIVITAMIN WITH MINERALS) TABS tablet Take 1 tablet by mouth daily.   Multiple Vitamins-Minerals (HAIR SKIN AND NAILS FORMULA) TABS Take 1 tablet by mouth daily.   NON FORMULARY Diet Type: NAS   omeprazole (PRILOSEC) 20 MG capsule Take 1 capsule (20 mg total) by mouth every morning.   Ostomy Supplies (SKIN PREP WIPES) MISC Apply to bilateral heels every shift for prevention   Polyethyl Glycol-Propyl Glycol (SYSTANE OP) Place 1 drop into both eyes daily as needed. Dry Eyes    polyethylene glycol (MIRALAX / GLYCOLAX) 17 g packet Take 17 g by mouth daily as needed for mild constipation.   QUEtiapine (SEROQUEL) 25 MG tablet Take 1 tablet (25 mg total) by mouth at bedtime.   sertraline (ZOLOFT) 50 MG tablet Take 1 tablet (50 mg total) by mouth every morning.   [DISCONTINUED] HYDROcodone-acetaminophen (NORCO) 5-325 MG tablet Take 1-2 tablets by mouth every 6 (six) hours as needed for severe pain. (Patient not taking: Reported on 12/12/2018)   No facility-administered encounter medications on file as of 12/12/2018.     Review of Systems  Review of Systems  Constitutional: Negative for activity change, appetite change, chills, diaphoresis, fatigue and fever.  HENT: Negative for mouth sores, postnasal drip, rhinorrhea, sinus pain and sore throat.   Respiratory: Negative for apnea,  cough, chest tightness, shortness of breath and wheezing.   Cardiovascular: Negative for chest pain, palpitations and leg swelling.  Gastrointestinal: Negative for abdominal distention, abdominal pain, constipation, diarrhea, nausea and vomiting.  Genitourinary: Negative for dysuria and frequency.  Musculoskeletal: Negative for arthralgias, joint swelling and myalgias.  Skin: Negative for rash.  Neurological: Negative for dizziness, syncope, weakness, light-headedness and numbness.  Psychiatric/Behavioral: Negative for behavioral problems, confusion and sleep disturbance.     Immunization History  Administered Date(s) Administered   Influenza-Unspecified 04/02/2018   Pneumococcal Polysaccharide-23 05/18/2016   Tdap 01/17/2012   Zoster Recombinat (Shingrix) 06/14/2018   Pertinent  Health Maintenance Due  Topic Date Due   PNA vac Low Risk Adult (2 of 2 - PCV13) 01/06/2019 (Originally 05/18/2017)   INFLUENZA VACCINE  02/08/2019   DEXA SCAN  Completed   No flowsheet data found. Functional Status Survey:    Vitals:   12/12/18 1035  BP: 119/70  Pulse: 74  Resp: 20  Temp: 98.6 F (37 C)  Weight: 140 lb 3.2 oz (  63.6 kg)  Height: 5\' 5"  (1.651 m)   Body mass index is 23.33 kg/m. Physical Exam Constitutional: . Well-developed and well-nourished.  HENT:  Head: Normocephalic.  Mouth/Throat: Oropharynx is clear and moist.  Eyes: Pupils are equal, round, and reactive to light.  Neck: Neck supple.  Cardiovascular: Normal rate and normal heart sounds.  No murmur heard. Pulmonary/Chest: Effort normal and breath sounds normal. No respiratory distress. No wheezes. She has no rales.  Abdominal: Soft. Bowel sounds are normal. No distension. There is no tenderness. There is no rebound.  Musculoskeletal: No edema.  Lymphadenopathy: none Neurological: Alert and oriented to time and Place Knew her DOB and her age 48 Commands and Answer Appropriately Skin: Skin is warm and  dry.  Psychiatric: Normal mood and affect. Behavior is normal. Thought content normal.   Labs reviewed: Recent Labs    12/02/18 0422 12/03/18 0442 12/04/18 0354 12/05/18 0756 12/10/18 0700  NA 139 139 138 139  --   K 4.3 4.0 3.8 4.2  --   CL 101 98 99 101  --   CO2 29 32 28 35*  --   GLUCOSE 106* 115* 115* 94  --   BUN 34* 20 14 12   --   CREATININE 1.03* 0.85 0.73 0.82  --   CALCIUM 9.5 9.7 9.5 9.1  --   MG 2.0 1.6*  --   --  1.6*   Recent Labs    11/24/18 1135  AST 30  ALT 20  ALKPHOS 59  BILITOT 2.2*  PROT 7.1  ALBUMIN 4.1   Recent Labs    11/24/18 1135 11/28/18 0607  12/04/18 0354 12/05/18 0756 12/10/18 0700  WBC 6.9 8.1   < > 7.7 7.2 7.5  NEUTROABS 5.2 6.5  --   --   --   --   HGB 12.5 11.8*   < > 10.7* 10.0* 10.8*  HCT 40.0 38.8   < > 34.3* 32.3* 35.6*  MCV 85.3 85.8   < > 82.3 83.2 84.8  PLT 256 226   < > 205 224 380   < > = values in this interval not displayed.   Lab Results  Component Value Date   TSH 1.615 11/25/2018   No results found for: HGBA1C No results found for: CHOL, HDL, LDLCALC, LDLDIRECT, TRIG, CHOLHDL  Significant Diagnostic Results in last 30 days:  Dg Chest 1 View  Result Date: 11/28/2018 CLINICAL DATA:  Hip fracture EXAM: CHEST  1 VIEW COMPARISON:  11/24/2018 FINDINGS: Cardiomegaly. Mitral annular calcification. Small pleural effusions and diffuse interstitial opacity. No pneumothorax. No appreciable fracture. IMPRESSION: CHF pattern. Electronically Signed   By: Monte Fantasia M.D.   On: 11/28/2018 07:00   Ct Head Wo Contrast  Result Date: 11/28/2018 CLINICAL DATA:  Multiple falls EXAM: CT HEAD WITHOUT CONTRAST CT CERVICAL SPINE WITHOUT CONTRAST TECHNIQUE: Multidetector CT imaging of the head and cervical spine was performed following the standard protocol without intravenous contrast. Multiplanar CT image reconstructions of the cervical spine were also generated. COMPARISON:  11/25/2018 head CT FINDINGS: CT HEAD FINDINGS Brain: No  evidence of acute infarction, hemorrhage, hydrocephalus, extra-axial collection or mass lesion/mass effect. Mild chronic small vessel ischemia and volume loss for age. Vascular: Atherosclerosis Skull: Negative for fracture Sinuses/Orbits: No evidence of injury.  Bilateral cataract resection CT CERVICAL SPINE FINDINGS Alignment: Normal Skull base and vertebrae: Negative for acute fracture. Soft tissues and spinal canal: No prevertebral fluid or swelling. No visible canal hematoma. Disc levels: Calcified disc herniation at  C5-6 with size implying flattening of the cord. Disc narrowing and uncovertebral spurring causes foraminal stenosis at C4-5 to C6-7. Upper chest: A right pleural effusion is minimally covered. This was also seen on recent chest x-ray 11/24/2018 IMPRESSION: 1. No evidence of intracranial injury or cervical spine fracture. 2. Cervical spine degeneration with notable calcified disc herniation at C5-6 that impinges on the cord. Electronically Signed   By: Monte Fantasia M.D.   On: 11/28/2018 06:53   Ct Head Wo Contrast  Result Date: 11/25/2018 CLINICAL DATA:  Altered mental status.  Recent falls EXAM: CT HEAD WITHOUT CONTRAST TECHNIQUE: Contiguous axial images were obtained from the base of the skull through the vertex without intravenous contrast. COMPARISON:  None. FINDINGS: Brain: There is mild diffuse atrophy. There is no intracranial mass, hemorrhage, extra-axial fluid collection, or midline shift. There is patchy small vessel disease in the centra semiovale bilaterally. No acute appearing infarct is demonstrable on this study. Vascular: There is no appreciable hyperdense vessel. There is calcification in the right carotid siphon region as well as in the distal right vertebral artery. Skull: The bony calvarium appears intact. Sinuses/Orbits: Visualized paranasal sinuses are clear. Orbits appear symmetric bilaterally. Other: Mastoid air cells clear. IMPRESSION: Mild atrophy with patchy  periventricular small vessel disease. No evident acute infarct. No mass or hemorrhage. Foci of arterial vascular calcification noted. Electronically Signed   By: Lowella Grip III M.D.   On: 11/25/2018 10:56   Ct Cervical Spine Wo Contrast  Result Date: 11/28/2018 CLINICAL DATA:  Multiple falls EXAM: CT HEAD WITHOUT CONTRAST CT CERVICAL SPINE WITHOUT CONTRAST TECHNIQUE: Multidetector CT imaging of the head and cervical spine was performed following the standard protocol without intravenous contrast. Multiplanar CT image reconstructions of the cervical spine were also generated. COMPARISON:  11/25/2018 head CT FINDINGS: CT HEAD FINDINGS Brain: No evidence of acute infarction, hemorrhage, hydrocephalus, extra-axial collection or mass lesion/mass effect. Mild chronic small vessel ischemia and volume loss for age. Vascular: Atherosclerosis Skull: Negative for fracture Sinuses/Orbits: No evidence of injury.  Bilateral cataract resection CT CERVICAL SPINE FINDINGS Alignment: Normal Skull base and vertebrae: Negative for acute fracture. Soft tissues and spinal canal: No prevertebral fluid or swelling. No visible canal hematoma. Disc levels: Calcified disc herniation at C5-6 with size implying flattening of the cord. Disc narrowing and uncovertebral spurring causes foraminal stenosis at C4-5 to C6-7. Upper chest: A right pleural effusion is minimally covered. This was also seen on recent chest x-ray 11/24/2018 IMPRESSION: 1. No evidence of intracranial injury or cervical spine fracture. 2. Cervical spine degeneration with notable calcified disc herniation at C5-6 that impinges on the cord. Electronically Signed   By: Monte Fantasia M.D.   On: 11/28/2018 06:53   Ct Hip Right Wo Contrast  Result Date: 11/28/2018 CLINICAL DATA:  Fall with right hip pain.  Abnormal radiograph EXAM: CT OF THE RIGHT HIP WITHOUT CONTRAST TECHNIQUE: Multidetector CT imaging of the right hip was performed according to the standard  protocol. Multiplanar CT image reconstructions were also generated. COMPARISON:  Radiography from earlier today FINDINGS: Oblique fracture through the intertrochanteric right femur without displacement. There is mild comminution at the greater trochanter. The hip is located. The visualized right hemipelvis is intact. Regional soft tissue edema and small joint effusion. Pelvic ascites which is trace where visualized and likely from volume overload given chest x-ray. IMPRESSION: Nondisplaced intertrochanteric right femur fracture. Electronically Signed   By: Monte Fantasia M.D.   On: 11/28/2018 06:59   Dg Chest [facility]  1 View  Result Date: 11/24/2018 CLINICAL DATA:  Weakness.  Atrial fibrillation. EXAM: PORTABLE CHEST 1 VIEW COMPARISON:  01/29/2014 FINDINGS: The heart size is enlarged. There is aortic atherosclerosis. Bilateral pleural effusions and mild pulmonary edema identified. The atelectasis identified within the left midlung. IMPRESSION: 1. Cardiac enlargement, aortic atherosclerosis and congestive heart failure. 2.  Aortic Atherosclerosis (ICD10-I70.0). Electronically Signed   By: Kerby Moors M.D.   On: 11/24/2018 12:19   Dg C-arm 1-60 Min  Result Date: 11/29/2018 CLINICAL DATA:  ORIF of the right hip EXAM: DG C-ARM 61-120 MIN COMPARISON:  11/28/2018 FINDINGS: Three images were obtained of the right femur during intramedullary nail placement. A total of 20 seconds of fluoroscopy time was utilized for this exam. The alignment appears improved. Again is an intratrochanteric fracture of the proximal right femur. IMPRESSION: Status post ORIF of the right femur. Electronically Signed   By: Constance Holster M.D.   On: 11/29/2018 16:00   Dg Hip Unilat With Pelvis 2-3 Views Left  Result Date: 11/14/2018 CLINICAL DATA:  Fall with left posterior hip pain. EXAM: DG HIP (WITH OR WITHOUT PELVIS) 2-3V LEFT COMPARISON:  None. FINDINGS: Frontal pelvis shows bony demineralization. SI joints and symphysis pubis  unremarkable. AP and frog-leg lateral views of the left hip show a sclerotic line superimposed on the subcapital region of the femoral neck with evidence of femoral head spurring. No worrisome lytic or sclerotic osseous abnormality. IMPRESSION: Sclerotic line superimposed on the left femoral neck likely related to a collar of hypertrophic spurs from degenerative change. No definite femoral neck fracture evident. If there is high clinical index of suspicion, MRI would be the study of choice to further evaluate. Electronically Signed   By: Misty Stanley M.D.   On: 11/14/2018 12:21   Dg Hip Unilat W Or Wo Pelvis 2-3 Views Right  Result Date: 11/28/2018 CLINICAL DATA:  Recurrent falls.  Right hip pain EXAM: DG HIP (WITH OR WITHOUT PELVIS) 2-3V RIGHT COMPARISON:  12/16/2013 FINDINGS: Unexpected lucency obliquely across the proximal femur at the level of the intertrochanteric segment. This persists on the full pelvis view. No dislocation. IMPRESSION: Probable nondisplaced intertrochanteric femur fracture on the right. Would confirm with CT prior to any surgery. Electronically Signed   By: Monte Fantasia M.D.   On: 11/28/2018 05:59   Dg Femur, Min 2 Views Right  Result Date: 11/29/2018 CLINICAL DATA:  Right hip ORIF EXAM: RIGHT FEMUR 2 VIEWS COMPARISON:  11/28/2018 FINDINGS: The patient has undergone placement of a intramedullary nail through the proximal right femur. The alignment appears improved. There is no unexpected postoperative finding. IMPRESSION: Status post ORIF of the right hip. Electronically Signed   By: Constance Holster M.D.   On: 11/29/2018 16:00    Assessment/Plan Dementia with Behavior Issues Patient doe shave Cognitive Impairment with sundowning We will start her on Namenda 5 mg twice daily Continue on Seroquel 25 nightly Her CT scan in the hospital does show small vessel disease no acute issues We will also get speech to evaluate her Patient would need more supervision when she is  discharged home. Low magnesium Supplemented will repeat in a week   Her Other Issues Atrial fibrillation with RVR  Rate Controlled on Cardizem Also On Eliquis  Acute diastolic CHF (congestive heart failure)  Was treated with Lasix in the hospital  But has been Doing well since then  Was not discharged on lasix EF of more then 65%  Closed displaced intertrochanteric fracture of right femur WBAT  Pain seems controlled on Tylenol Already walking with therapy  Anxiety and depression On Zoloft  Post-menopausal osteoporosis Continue on Fosamax    Family/ staff Communication:   Labs/tests ordered:  BMP and Mag  Total time spent in this patient care encounter was  25_  minutes; greater than 50% of the visit spent counseling patient and staff, reviewing records , Labs and coordinating care for problems addressed at this encounter.

## 2018-12-13 ENCOUNTER — Encounter (HOSPITAL_COMMUNITY)
Admission: RE | Admit: 2018-12-13 | Discharge: 2018-12-13 | Disposition: A | Discharge status: Home / Self Care | Payer: PPO | Source: Skilled Nursing Facility | Attending: Internal Medicine | Admitting: Internal Medicine

## 2018-12-13 DIAGNOSIS — S72144D Nondisplaced intertrochanteric fracture of right femur, subsequent encounter for closed fracture with routine healing: Secondary | ICD-10-CM | POA: Insufficient documentation

## 2018-12-13 DIAGNOSIS — R410 Disorientation, unspecified: Secondary | ICD-10-CM | POA: Insufficient documentation

## 2018-12-13 DIAGNOSIS — D62 Acute posthemorrhagic anemia: Secondary | ICD-10-CM | POA: Insufficient documentation

## 2018-12-16 ENCOUNTER — Encounter (HOSPITAL_COMMUNITY)
Admission: RE | Admit: 2018-12-16 | Discharge: 2018-12-16 | Disposition: A | Discharge status: Home / Self Care | Payer: PPO | Source: Skilled Nursing Facility | Attending: *Deleted | Admitting: *Deleted

## 2018-12-16 ENCOUNTER — Encounter: Payer: Self-pay | Admitting: Adult Health

## 2018-12-16 ENCOUNTER — Non-Acute Institutional Stay (SKILLED_NURSING_FACILITY): Payer: PPO | Admitting: Adult Health

## 2018-12-16 DIAGNOSIS — K219 Gastro-esophageal reflux disease without esophagitis: Secondary | ICD-10-CM

## 2018-12-16 DIAGNOSIS — S72141S Displaced intertrochanteric fracture of right femur, sequela: Secondary | ICD-10-CM | POA: Diagnosis not present

## 2018-12-16 DIAGNOSIS — M810 Age-related osteoporosis without current pathological fracture: Secondary | ICD-10-CM | POA: Diagnosis not present

## 2018-12-16 LAB — SARS CORONAVIRUS 2 BY RT PCR (HOSPITAL ORDER, PERFORMED IN ~~LOC~~ HOSPITAL LAB): SARS Coronavirus 2: NEGATIVE

## 2018-12-16 LAB — MAGNESIUM: Magnesium: 1.9 mg/dL (ref 1.7–2.4)

## 2018-12-16 NOTE — Progress Notes (Signed)
Location:   [facility] Room Number: 419 D Place of Service:  SNF (31)   CODE STATUS: Full Code  Allergies  Allergen Reactions   Penicillins Anaphylaxis   Emetrol Nausea Only    swelling   Feldene [Piroxicam] Nausea And Vomiting   Sulfa Antibiotics     swelling    Chief Complaint  Patient presents with   Medical Management of Chronic Issues    GERD without esophagitis; post menopausal osteoporosis; closed displaced intertrochanteric fracture of right femur sequela. Weekly follow up for the first 30 days post hospitalization    HPI:  She is a 85 year old short term rehab patient being seen for the management of her chronic illnesses; gerd; osteoporosis; femur fracture. She denies any uncontrolled pain; is sleeping well at night; has a good appetite. No reports of fevers.   Past Medical History:  Diagnosis Date   Chronic back pain    Diarrhea    Dysphagia 05/03/2016   GERD (gastroesophageal reflux disease)     Past Surgical History:  Procedure Laterality Date   ABDOMINAL HYSTERECTOMY     BACK SURGERY     CHOLECYSTECTOMY N/A 01/29/2014   Procedure: LAPAROSCOPIC CHOLECYSTECTOMY;  Surgeon: Scherry Ran, MD;  Location: [facility];  Service: General;  Laterality: N/A;   COLONOSCOPY N/A 10/27/2015   Procedure: COLONOSCOPY;  Surgeon: Rogene Houston, MD;  [facility];  Service: Endoscopy;  Laterality: N/A;  215   INTRAMEDULLARY (IM) NAIL INTERTROCHANTERIC Right 11/29/2018   Procedure: INTRAMEDULLARY (IM) NAIL INTERTROCHANTRIC FRACTURE;  Surgeon: Renette Butters, MD;  Location: [facility];  Service: Orthopedics;  Laterality: Right;   TONSILLECTOMY     YAG LASER APPLICATION Left 62/22/9798   Procedure: YAG LASER APPLICATION;  Surgeon: Elta Guadeloupe T. Gershon Crane, MD;  Location: [facility];  Service: Ophthalmology;  Laterality: Left;  left    Social History   Socioeconomic History   Marital status: Married    Spouse name: Not on file    Number of children: Not on file   Years of education: Not on file   Highest education level: Not on file  Occupational History   Not on file  Social Needs   Financial resource strain: Not on file   Food insecurity:    Worry: Not on file    Inability: Not on file   Transportation needs:    Medical: Not on file    Non-medical: Not on file  Tobacco Use   Smoking status: Never Smoker   Smokeless tobacco: Never Used  Substance and Sexual Activity   Alcohol use: No   Drug use: No   Sexual activity: Yes    Birth control/protection: Surgical  Lifestyle   Physical activity:    Days per week: Not on file    Minutes per session: Not on file   Stress: Not on file  Relationships   Social connections:    Talks on phone: Not on file    Gets together: Not on file    Attends religious service: Not on file    Active member of club or organization: Not on file    Attends meetings of clubs or organizations: Not on file    Relationship status: Not on file   Intimate partner violence:    Fear of current or ex partner: Not on file    Emotionally abused: Not on file    Physically abused: Not on file    Forced sexual activity: Not on file  Other Topics Concern   Not on file  Social History Narrative   Not on file   History reviewed. No pertinent family history.    VITAL SIGNS BP 122/64    Pulse 99    Temp 98.3 F (36.8 C)    Resp 18    Ht 5\' 5"  (1.651 m)    Wt 140 lb 3.2 oz (63.6 kg)    BMI 23.33 kg/m   Outpatient Encounter Medications as of 12/16/2018  Medication Sig   acetaminophen (TYLENOL) 325 MG tablet Take 650 mg by mouth every 6 (six) hours as needed.   alendronate (FOSAMAX) 70 MG tablet Take 1 tablet (70 mg total) by mouth once a week. Take with a full glass of water on an empty stomach.   apixaban (ELIQUIS) 2.5 MG TABS tablet Take 1 tablet (2.5 mg total) by mouth 2 (two) times daily.   Balsam [place]-Castor Oil (VENELEX) OINT Apply topically to sacrum and  bilateral buttocks every shift and as needed for prevention   calcium-vitamin D (OS-CAL 500 + D) 500-200 MG-UNIT tablet Take 1 tablet by mouth 2 (two) times daily.   diltiazem (CARDIZEM CD) 240 MG 24 hr capsule Take 1 capsule (240 mg total) by mouth daily.   diphenoxylate-atropine (LOMOTIL) 2.5-0.025 MG tablet Take 1 tablet by mouth 4 (four) times daily as needed for diarrhea or loose stools.   docusate sodium (COLACE) 100 MG capsule Take 1 capsule (100 mg total) by mouth 2 (two) times daily.   feeding supplement, ENSURE ENLIVE, (ENSURE ENLIVE) LIQD Take 237 mLs by mouth 2 (two) times daily between meals.   magnesium oxide (MAG-OX) 400 MG tablet Take 400 mg by mouth 3 (three) times daily.   memantine (NAMENDA) 5 MG tablet Take 5 mg by mouth 2 (two) times daily.   Multiple Vitamin (MULTIVITAMIN WITH MINERALS) TABS tablet Take 1 tablet by mouth daily.   Multiple Vitamins-Minerals (HAIR SKIN AND NAILS FORMULA) TABS Take 1 tablet by mouth daily.   NON FORMULARY Diet Type: NAS   omeprazole (PRILOSEC) 20 MG capsule Take 1 capsule (20 mg total) by mouth every morning.   Ostomy Supplies (SKIN PREP WIPES) MISC Apply to bilateral heels every shift for prevention   Polyethyl Glycol-Propyl Glycol (SYSTANE OP) Place 1 drop into both eyes daily as needed. Dry Eyes    polyethylene glycol (MIRALAX / GLYCOLAX) 17 g packet Take 17 g by mouth daily as needed for mild constipation.   QUEtiapine (SEROQUEL) 25 MG tablet Take 1 tablet (25 mg total) by mouth at bedtime.   sertraline (ZOLOFT) 50 MG tablet Take 1 tablet (50 mg total) by mouth every morning.   [DISCONTINUED] magnesium oxide (MAG-OX) 400 (241.3 Mg) MG tablet Take 1 tablet (400 mg total) by mouth 2 (two) times daily. (Patient not taking: Reported on 12/16/2018)   No facility-administered encounter medications on file as of 12/16/2018.      SIGNIFICANT DIAGNOSTIC EXAMS  PREVIOUS:   11-24-18: chest x-ray:  1. Cardiac enlargement, aortic  atherosclerosis and congestive heart failure. 2.  Aortic Atherosclerosis   11-25-18: ct of head: Mild atrophy with patchy periventricular small vessel disease. No evident acute infarct. No mass or hemorrhage. Foci of arterial vascular calcification noted.  11-25-18: 2-d echo:  1. The left ventricle has hyperdynamic systolic function, with an ejection fraction of >65%. The cavity size was normal. Left ventricular diastolic Doppler parameters are indeterminate.  2. The right ventricle has moderately reduced systolic function. The cavity was mildly enlarged. There is  no increase in right ventricular wall thickness.  3. Left atrial size was severely dilated.  4. Right atrial size was severely dilated.  5. The aortic valve is tricuspid. Mild thickening of the aortic valve. Moderate calcification of the aortic valve. Mild to moderate aortic annular calcification noted.  6. The mitral valve is abnormal. Moderate thickening of the mitral valve leaflet. Moderate calcification of the mitral valve leaflet. There is moderate mitral annular calcification present. Mild mitral valve stenosis.  7. MV is thickened, calcified with very mildly restricted motion.  8. The tricuspid valve is grossly normal. Tricuspid valve regurgitation is moderate.  9. The aortic root is normal in size and structure. 10. The inferior vena cava was dilated in size with <50% respiratory variability. 11. The interatrial septum was not assessed.  11-28-18: right hip x-ray: Probable nondisplaced intertrochanteric femur fracture on the right. Would confirm with CT prior to any surgery.  11-28-18: ct of head and cervical spine: 1. No evidence of intracranial injury or cervical spine fracture. 2. Cervical spine degeneration with notable calcified disc herniation at C5-6 that impinges on the cord.  11-28-18: ct of right hip: Nondisplaced intertrochanteric right femur fracture.  NO NEW EXAMS.   LABS REVIEWED TODAY:   11-24-18: wbc 6.9;  hgb 12.5; hct 40.0; mcv 85.3; plt 256; glucose 102; bun 18; creat 0.82; k+ 3.7; na++ 142; ca 9.5 liver normal albumin 4.1 11-25-18: glucose 95; bun 14; creat 0.71; k+ 3.4; na++ 141; ca 8.9 mag 1.5 tsh 1.615 11-29-18: wbc 10.3; hgb 10.9; hct 35.0; mcv 84.3 plt 241 glucose 109; bun 17; creat 1.06 ;k+ 4.4; na++ 139  12-04-18: wbc 7.7; hgb 10.7; hct 34.3; mcv 92.3 plt 205 glucose 115; bun 14; creat 0.73; k+ 3.8; na++ 138; ca 9.5 12-05-18: wbc 7.2; hgb 10.0; hct 32.3; mcv 83.2; plt 224; glucose 94; bun 12; creat 0.82; k+ 4.2; na++ 139; ca 9.1   TODAY:   12-10-18: wbc 7.5; hgb 10.8; hct 35.6; mcv 84.8; plt 380 mag 1.6  12-16-18: mag 1.9    Review of Systems  Reason unable to perform ROS: poor historian   Constitutional: Negative for malaise/fatigue.  Respiratory: Negative for cough and shortness of breath.   Cardiovascular: Negative for chest pain and leg swelling.  Gastrointestinal: Negative for abdominal pain and heartburn.  Musculoskeletal: Negative for back pain, joint pain and myalgias.  Skin: Negative.   Neurological: Negative for dizziness.  Psychiatric/Behavioral: The patient is not nervous/anxious.     Physical Exam Constitutional:      General: She is not in acute distress.    Appearance: She is well-developed. She is not diaphoretic.     Comments: thin  Neck:     Musculoskeletal: Neck supple.     Thyroid: No thyromegaly.  Cardiovascular:     Rate and Rhythm: Normal rate and regular rhythm.     Pulses: Normal pulses.     Heart sounds: Normal heart sounds.  Pulmonary:     Effort: Pulmonary effort is normal. No respiratory distress.     Breath sounds: Normal breath sounds.  Abdominal:     General: Bowel sounds are normal. There is no distension.     Palpations: Abdomen is soft.     Tenderness: There is no abdominal tenderness.  Musculoskeletal:     Right lower leg: Edema present.     Left lower leg: Edema present.     Comments: Is able to move all extremities Is status post  right intertrochanteric IM nailing Has trace  bilateral lower extremity edema    Lymphadenopathy:     Cervical: No cervical adenopathy.  Skin:    General: Skin is warm and dry.     Comments: Right hip incision line without signs of infection present.    Neurological:     Mental Status: She is alert. Mental status is at baseline.  Psychiatric:        Mood and Affect: Mood normal.       ASSESSMENT/ PLAN:  TODAY   1. Closed displaced intertrochanteric fracture of right femur sequela: is stable will continue therapy as directed and will follow up with orthopedics.   2. GERD without esophagitis: is stable will continue prilosec 20 mg daily   3. Post menopausal osteoporosis is status post recent right hip fracture: will continue fosamax 70 mg weekly with supplement.   PREVIOUS   4. Atrial fibrillation with RVR: heart rate is stable: will continue cardizem cd 240 mg daily for rate control; will continue eliquis 2.5 mg twice daily   5. Acute diastolic CHF( congestive heart failure) is stable EF >65% (11-28-18): will continue to monitor her status.   6. Chronic constipation: is stable will continue colace twice daily and miralax daily as needed   7. Acute delirium: is stable will continue seroquel 25 mg nightly will need to consider weaning off over the next couple weeks to month    8. Anxiety and depression: is stable will continue zoloft 50 mg daily  9. Hypomagnesemia: is stable mag 1.9  will continue magox 400 mg three times  daily will repeat mag level.  10. Acute blood loss anemia: is without change hgb 10.8 will monitor        MD is aware of resident's narcotic use and is in agreement with current plan of care. We will attempt to wean resident as apropriate   Ok Edwards NP [facility]  Contact [phone] Monday through Friday 8am- 5pm  After hours call [phone]

## 2018-12-18 ENCOUNTER — Encounter: Payer: Self-pay | Admitting: Adult Health

## 2018-12-18 ENCOUNTER — Non-Acute Institutional Stay (SKILLED_NURSING_FACILITY): Payer: PPO | Admitting: Adult Health

## 2018-12-18 DIAGNOSIS — R410 Disorientation, unspecified: Secondary | ICD-10-CM

## 2018-12-18 DIAGNOSIS — F0391 Unspecified dementia with behavioral disturbance: Secondary | ICD-10-CM

## 2018-12-18 DIAGNOSIS — S72141S Displaced intertrochanteric fracture of right femur, sequela: Secondary | ICD-10-CM | POA: Diagnosis not present

## 2018-12-18 NOTE — Progress Notes (Signed)
Location:   [facility] Room Number: 599 D Place of Service:  SNF (31)   CODE STATUS: Full Code  Allergies  Allergen Reactions  . Penicillins Anaphylaxis  . Emetrol Nausea Only    swelling  . Feldene [Piroxicam] Nausea And Vomiting  . Sulfa Antibiotics     swelling    Chief Complaint  Patient presents with  . Acute Visit    Care Plan Meeting    HPI:  We have come together for her acute care plan meeting. There is family present via phone Her BIMS 12/15 and depression 11/30. Her goal remains to return back home. She will need 24 hour care. Her daughter is aware of her need. She continues to have some hallucinations; which do not cause her distress. She was started on seroquel in the hospital. She will more than likely need this medication for the next couple of months; then she should be able to come off this medication. Her daughter is aware. She is ambulating and dressing with cueing. She is on namenda 5 mg twice daily and would benefit from increasing this medication. Her appetite is good. There are no reports of pain.   Past Medical History:  Diagnosis Date  . Chronic back pain   . Diarrhea   . Dysphagia 05/03/2016  . GERD (gastroesophageal reflux disease)     Past Surgical History:  Procedure Laterality Date  . ABDOMINAL HYSTERECTOMY    . BACK SURGERY    . CHOLECYSTECTOMY N/A 01/29/2014   Procedure: LAPAROSCOPIC CHOLECYSTECTOMY;  Surgeon: Scherry Ran, MD;  Location: [facility];  Service: General;  Laterality: N/A;  . COLONOSCOPY N/A 10/27/2015   Procedure: COLONOSCOPY;  Surgeon: Rogene Houston, MD;  [facility];  Service: Endoscopy;  Laterality: N/A;  215  . INTRAMEDULLARY (IM) NAIL INTERTROCHANTERIC Right 11/29/2018   Procedure: INTRAMEDULLARY (IM) NAIL INTERTROCHANTRIC FRACTURE;  Surgeon: Renette Butters, MD;  Location: [facility];  Service: Orthopedics;  Laterality: Right;  . TONSILLECTOMY    . YAG LASER APPLICATION Left  35/70/1779   Procedure: YAG LASER APPLICATION;  Surgeon: Elta Guadeloupe T. Gershon Crane, MD;  Location: [facility];  Service: Ophthalmology;  Laterality: Left;  left    Social History   Socioeconomic History  . Marital status: Married    Spouse name: Not on file  . Number of children: Not on file  . Years of education: Not on file  . Highest education level: Not on file  Occupational History  . Not on file  Social Needs  . Financial resource strain: Not on file  . Food insecurity:    Worry: Not on file    Inability: Not on file  . Transportation needs:    Medical: Not on file    Non-medical: Not on file  Tobacco Use  . Smoking status: Never Smoker  . Smokeless tobacco: Never Used  Substance and Sexual Activity  . Alcohol use: No  . Drug use: No  . Sexual activity: Yes    Birth control/protection: Surgical  Lifestyle  . Physical activity:    Days per week: Not on file    Minutes per session: Not on file  . Stress: Not on file  Relationships  . Social connections:    Talks on phone: Not on file    Gets together: Not on file    Attends religious service: Not on file    Active member of club or organization: Not on file    Attends meetings  of clubs or organizations: Not on file    Relationship status: Not on file  . Intimate partner violence:    Fear of current or ex partner: Not on file    Emotionally abused: Not on file    Physically abused: Not on file    Forced sexual activity: Not on file  Other Topics Concern  . Not on file  Social History Narrative  . Not on file   History reviewed. No pertinent family history.    VITAL SIGNS BP 108/61   Pulse (!) 106   Temp 98.9 F (37.2 C)   Resp 20   Ht 5\' 5"  (1.651 m)   Wt 135 lb 12.8 oz (61.6 kg)   BMI 22.60 kg/m   Outpatient Encounter Medications as of 12/18/2018  Medication Sig  . acetaminophen (TYLENOL) 325 MG tablet Take 650 mg by mouth every 6 (six) hours as needed.  Marland Kitchen alendronate (FOSAMAX) 70 MG tablet Take 1 tablet  (70 mg total) by mouth once a week. Take with a full glass of water on an empty stomach.  Marland Kitchen apixaban (ELIQUIS) 2.5 MG TABS tablet Take 1 tablet (2.5 mg total) by mouth 2 (two) times daily.  Roseanne Kaufman [place]-Castor Oil (VENELEX) OINT Apply topically to sacrum and bilateral buttocks every shift and as needed for prevention  . calcium-vitamin D (OS-CAL 500 + D) 500-200 MG-UNIT tablet Take 1 tablet by mouth 2 (two) times daily.  Marland Kitchen diltiazem (CARDIZEM CD) 240 MG 24 hr capsule Take 1 capsule (240 mg total) by mouth daily.  . diphenoxylate-atropine (LOMOTIL) 2.5-0.025 MG tablet Take 1 tablet by mouth 4 (four) times daily as needed for diarrhea or loose stools.  . docusate sodium (COLACE) 100 MG capsule Take 1 capsule (100 mg total) by mouth 2 (two) times daily.  . feeding supplement, ENSURE ENLIVE, (ENSURE ENLIVE) LIQD Take 237 mLs by mouth 2 (two) times daily between meals.  . magnesium oxide (MAG-OX) 400 MG tablet Take 400 mg by mouth 3 (three) times daily.  . memantine (NAMENDA) 5 MG tablet Take 5 mg by mouth 2 (two) times daily.  . Multiple Vitamin (MULTIVITAMIN WITH MINERALS) TABS tablet Take 1 tablet by mouth daily.  . Multiple Vitamins-Minerals (HAIR SKIN AND NAILS FORMULA) TABS Take 1 tablet by mouth daily.  . NON FORMULARY Diet Type: NAS  . omeprazole (PRILOSEC) 20 MG capsule Take 1 capsule (20 mg total) by mouth every morning.  Oneta Rack Supplies (SKIN PREP WIPES) MISC Apply to bilateral heels every shift for prevention  . Polyethyl Glycol-Propyl Glycol (SYSTANE OP) Place 1 drop into both eyes daily as needed. Dry Eyes   . polyethylene glycol (MIRALAX / GLYCOLAX) 17 g packet Take 17 g by mouth daily as needed for mild constipation.  . QUEtiapine (SEROQUEL) 25 MG tablet Take 1 tablet (25 mg total) by mouth at bedtime.  . sertraline (ZOLOFT) 50 MG tablet Take 1 tablet (50 mg total) by mouth every morning.   No facility-administered encounter medications on file as of 12/18/2018.      SIGNIFICANT  DIAGNOSTIC EXAMS   PREVIOUS:   11-24-18: chest x-ray:  1. Cardiac enlargement, aortic atherosclerosis and congestive heart failure. 2.  Aortic Atherosclerosis   11-25-18: ct of head: Mild atrophy with patchy periventricular small vessel disease. No evident acute infarct. No mass or hemorrhage. Foci of arterial vascular calcification noted.  11-25-18: 2-d echo:  1. The left ventricle has hyperdynamic systolic function, with an ejection fraction of >65%. The cavity size was  normal. Left ventricular diastolic Doppler parameters are indeterminate.  2. The right ventricle has moderately reduced systolic function. The cavity was mildly enlarged. There is no increase in right ventricular wall thickness.  3. Left atrial size was severely dilated.  4. Right atrial size was severely dilated.  5. The aortic valve is tricuspid. Mild thickening of the aortic valve. Moderate calcification of the aortic valve. Mild to moderate aortic annular calcification noted.  6. The mitral valve is abnormal. Moderate thickening of the mitral valve leaflet. Moderate calcification of the mitral valve leaflet. There is moderate mitral annular calcification present. Mild mitral valve stenosis.  7. MV is thickened, calcified with very mildly restricted motion.  8. The tricuspid valve is grossly normal. Tricuspid valve regurgitation is moderate.  9. The aortic root is normal in size and structure. 10. The inferior vena cava was dilated in size with <50% respiratory variability. 11. The interatrial septum was not assessed.  11-28-18: right hip x-ray: Probable nondisplaced intertrochanteric femur fracture on the right. Would confirm with CT prior to any surgery.  11-28-18: ct of head and cervical spine: 1. No evidence of intracranial injury or cervical spine fracture. 2. Cervical spine degeneration with notable calcified disc herniation at C5-6 that impinges on the cord.  11-28-18: ct of right hip: Nondisplaced  intertrochanteric right femur fracture.  NO NEW EXAMS.   LABS REVIEWED TODAY:   11-24-18: wbc 6.9; hgb 12.5; hct 40.0; mcv 85.3; plt 256; glucose 102; bun 18; creat 0.82; k+ 3.7; na++ 142; ca 9.5 liver normal albumin 4.1 11-25-18: glucose 95; bun 14; creat 0.71; k+ 3.4; na++ 141; ca 8.9 mag 1.5 tsh 1.615 11-29-18: wbc 10.3; hgb 10.9; hct 35.0; mcv 84.3 plt 241 glucose 109; bun 17; creat 1.06 ;k+ 4.4; na++ 139  12-04-18: wbc 7.7; hgb 10.7; hct 34.3; mcv 92.3 plt 205 glucose 115; bun 14; creat 0.73; k+ 3.8; na++ 138; ca 9.5 12-05-18: wbc 7.2; hgb 10.0; hct 32.3; mcv 83.2; plt 224; glucose 94; bun 12; creat 0.82; k+ 4.2; na++ 139; ca 9.1  12-10-18: wbc 7.5; hgb 10.8; hct 35.6; mcv 84.8; plt 380 mag 1.6  12-16-18: mag 1.9   NO NEW LABS.   Review of Systems  Reason unable to perform ROS: poor historian   Constitutional: Negative for malaise/fatigue.  Respiratory: Negative for cough and shortness of breath.   Cardiovascular: Negative for chest pain and leg swelling.  Gastrointestinal: Negative for constipation and heartburn.  Musculoskeletal: Negative for back pain, joint pain and myalgias.  Skin: Negative.   Psychiatric/Behavioral: The patient is not nervous/anxious.    Physical Exam Constitutional:      General: She is not in acute distress.    Appearance: She is well-developed. She is not diaphoretic.     Comments: thin  Neck:     Musculoskeletal: Neck supple.     Thyroid: No thyromegaly.  Cardiovascular:     Rate and Rhythm: Normal rate and regular rhythm.     Pulses: Normal pulses.     Heart sounds: Normal heart sounds.  Pulmonary:     Effort: Pulmonary effort is normal. No respiratory distress.     Breath sounds: Normal breath sounds.  Abdominal:     General: Bowel sounds are normal. There is no distension.     Palpations: Abdomen is soft.     Tenderness: There is no abdominal tenderness.  Musculoskeletal:     Right lower leg: Edema present.     Left lower leg: Edema present.  Comments: Is able to move all extremities Is status post right intertrochanteric IM nailing Has trace bilateral lower extremity edema     Lymphadenopathy:     Cervical: No cervical adenopathy.  Skin:    General: Skin is warm and dry.  Neurological:     Mental Status: She is alert. Mental status is at baseline.  Psychiatric:        Mood and Affect: Mood normal.      ASSESSMENT/ PLAN:  TODAY  1. Acute delirium  2. Dementia with behavioral disturbance unspecified dementia type 3. Closed displaced intertrochanteric of right femur sequela   Her goal remains to return home Will increase her namenda to 10 mg twice daily  Will continue therapy as directed Will continue to monitor her status.       MD is aware of resident's narcotic use and is in agreement with current plan of care. We will attempt to wean resident as apropriate   Ok Edwards NP [facility]  Contact [phone] Monday through Friday 8am- 5pm  After hours call [phone]

## 2018-12-19 ENCOUNTER — Encounter (HOSPITAL_COMMUNITY)
Admission: RE | Admit: 2018-12-19 | Discharge: 2018-12-19 | Disposition: A | Discharge status: Home / Self Care | Payer: PPO | Source: Skilled Nursing Facility | Attending: *Deleted | Admitting: *Deleted

## 2018-12-19 LAB — CBC
HCT: 36.7 % (ref 36.0–46.0)
Hemoglobin: 11.1 g/dL — ABNORMAL LOW (ref 12.0–15.0)
MCH: 26.1 pg (ref 26.0–34.0)
MCHC: 30.2 g/dL (ref 30.0–36.0)
MCV: 86.2 fL (ref 80.0–100.0)
Platelets: 309 10*3/uL (ref 150–400)
RBC: 4.26 MIL/uL (ref 3.87–5.11)
RDW: 16.9 % — ABNORMAL HIGH (ref 11.5–15.5)
WBC: 6.7 10*3/uL (ref 4.0–10.5)
nRBC: 0 % (ref 0.0–0.2)

## 2018-12-19 LAB — MAGNESIUM: Magnesium: 1.8 mg/dL (ref 1.7–2.4)

## 2018-12-19 LAB — BASIC METABOLIC PANEL
Anion gap: 14 (ref 5–15)
BUN: 25 mg/dL — ABNORMAL HIGH (ref 8–23)
CO2: 22 mmol/L (ref 22–32)
Calcium: 9.6 mg/dL (ref 8.9–10.3)
Chloride: 105 mmol/L (ref 98–111)
Creatinine, Ser: 0.62 mg/dL (ref 0.44–1.00)
GFR calc Af Amer: >60 mL/min (ref >60)
GFR calc non Af Amer: >60 mL/min (ref >60)
Glucose, Bld: 110 mg/dL — ABNORMAL HIGH (ref 70–99)
Potassium: 4 mmol/L (ref 3.5–5.1)
Sodium: 141 mmol/L (ref 135–145)

## 2018-12-23 ENCOUNTER — Non-Acute Institutional Stay (SKILLED_NURSING_FACILITY): Payer: PPO | Admitting: Adult Health

## 2018-12-23 ENCOUNTER — Encounter: Payer: Self-pay | Admitting: Adult Health

## 2018-12-23 ENCOUNTER — Other Ambulatory Visit: Payer: Self-pay | Admitting: Adult Health

## 2018-12-23 DIAGNOSIS — I4891 Unspecified atrial fibrillation: Secondary | ICD-10-CM | POA: Diagnosis not present

## 2018-12-23 DIAGNOSIS — S72141S Displaced intertrochanteric fracture of right femur, sequela: Secondary | ICD-10-CM | POA: Diagnosis not present

## 2018-12-23 DIAGNOSIS — D62 Acute posthemorrhagic anemia: Secondary | ICD-10-CM | POA: Diagnosis not present

## 2018-12-23 DIAGNOSIS — M810 Age-related osteoporosis without current pathological fracture: Secondary | ICD-10-CM

## 2018-12-23 MED ORDER — DIPHENOXYLATE-ATROPINE 2.5-0.025 MG PO TABS: 1.0000 | ORAL_TABLET | Freq: Four times a day (QID) | ORAL | 0 refills | Status: DC | PRN

## 2018-12-23 MED ORDER — QUETIAPINE FUMARATE 25 MG PO TABS: 25.0000 mg | ORAL_TABLET | Freq: Every day | ORAL | 0 refills | Status: DC

## 2018-12-23 MED ORDER — DILTIAZEM HCL ER COATED BEADS 240 MG PO CP24: 240.0000 mg | ORAL_CAPSULE | Freq: Every day | ORAL | 0 refills | Status: DC

## 2018-12-23 MED ORDER — MAGNESIUM OXIDE 400 MG PO TABS: 400.0000 mg | ORAL_TABLET | Freq: Three times a day (TID) | ORAL | 0 refills | Status: AC

## 2018-12-23 MED ORDER — APIXABAN 2.5 MG PO TABS: 2.5000 mg | ORAL_TABLET | Freq: Two times a day (BID) | ORAL | 0 refills | Status: DC

## 2018-12-23 MED ORDER — ALENDRONATE SODIUM 70 MG PO TABS: 70.0000 mg | ORAL_TABLET | ORAL | 0 refills | Status: DC

## 2018-12-23 MED ORDER — SERTRALINE HCL 50 MG PO TABS: 50.0000 mg | ORAL_TABLET | Freq: Every morning | ORAL | 0 refills | Status: AC

## 2018-12-23 MED ORDER — MEMANTINE HCL 5 MG PO TABS: 5.0000 mg | ORAL_TABLET | Freq: Two times a day (BID) | ORAL | 0 refills | Status: DC

## 2018-12-23 NOTE — Progress Notes (Signed)
Location:   [place] Room Number: 148 D Place of Service:  SNF (31)    CODE STATUS: Full Code  Allergies  Allergen Reactions  . Penicillins Anaphylaxis  . Emetrol Nausea Only    swelling  . Feldene [Piroxicam] Nausea And Vomiting  . Sulfa Antibiotics     swelling    Chief Complaint  Patient presents with  . Discharge Note    Discharging to home on 12/25/2018    HPI:  She is being discharged to home with home health for pt/ot/rn. She will not need any dme; she has all needed equipment. She will need to have her prescriptions written and will need to follow up with her medical provider. She was hospitalized for a right hip fracture. She was admitted to this facility for short term rehab; she has completed therapy at a snf level. She is now ready to complete her therapy on a home health basis.     Past Medical History:  Diagnosis Date  . Chronic back pain   . Diarrhea   . Dysphagia 05/03/2016  . GERD (gastroesophageal reflux disease)     Past Surgical History:  Procedure Laterality Date  . ABDOMINAL HYSTERECTOMY    . BACK SURGERY    . CHOLECYSTECTOMY N/A 01/29/2014   Procedure: LAPAROSCOPIC CHOLECYSTECTOMY;  Surgeon: Scherry Ran, MD;  Location: [facility];  Service: General;  Laterality: N/A;  . COLONOSCOPY N/A 10/27/2015   Procedure: COLONOSCOPY;  Surgeon: Rogene Houston, MD;  [facility];  Service: Endoscopy;  Laterality: N/A;  215  . INTRAMEDULLARY (IM) NAIL INTERTROCHANTERIC Right 11/29/2018   Procedure: INTRAMEDULLARY (IM) NAIL INTERTROCHANTRIC FRACTURE;  Surgeon: Renette Butters, MD;  Location: [facility];  Service: Orthopedics;  Laterality: Right;  . TONSILLECTOMY    . YAG LASER APPLICATION Left 42/59/5638   Procedure: YAG LASER APPLICATION;  Surgeon: Elta Guadeloupe T. Gershon Crane, MD;  Location: [facility];  Service: Ophthalmology;  Laterality: Left;  left    Social History   Socioeconomic History  . Marital status: Married   Spouse name: Not on file  . Number of children: Not on file  . Years of education: Not on file  . Highest education level: Not on file  Occupational History  . Not on file  Social Needs  . Financial resource strain: Not on file  . Food insecurity    Worry: Not on file    Inability: Not on file  . Transportation needs    Medical: Not on file    Non-medical: Not on file  Tobacco Use  . Smoking status: Never Smoker  . Smokeless tobacco: Never Used  Substance and Sexual Activity  . Alcohol use: No  . Drug use: No  . Sexual activity: Yes    Birth control/protection: Surgical  Lifestyle  . Physical activity    Days per week: Not on file    Minutes per session: Not on file  . Stress: Not on file  Relationships  . Social Herbalist on phone: Not on file    Gets together: Not on file    Attends religious service: Not on file    Active member of club or organization: Not on file    Attends meetings of clubs or organizations: Not on file    Relationship status: Not on file  . Intimate partner violence    Fear of current or ex partner: Not on file    Emotionally abused: Not on file  Physically abused: Not on file    Forced sexual activity: Not on file  Other Topics Concern  . Not on file  Social History Narrative  . Not on file   History reviewed. No pertinent family history.  VITAL SIGNS BP 122/70   Pulse 84   Temp 97.6 F (36.4 C)   Resp 20   Ht 5\' 5"  (1.651 m)   Wt 135 lb 12.8 oz (61.6 kg)   BMI 22.60 kg/m   Patient's Medications  New Prescriptions   No medications on file  Previous Medications   ACETAMINOPHEN (TYLENOL) 325 MG TABLET    Take 650 mg by mouth every 6 (six) hours as needed.   ALENDRONATE (FOSAMAX) 70 MG TABLET    Take 1 tablet (70 mg total) by mouth once a week. Take with a full glass of water on an empty stomach.   APIXABAN (ELIQUIS) 2.5 MG TABS TABLET    Take 1 tablet (2.5 mg total) by mouth 2 (two) times daily.   BALSAM PERU-CASTOR  OIL (VENELEX) OINT    Apply topically to sacrum and bilateral buttocks every shift and as needed for prevention   CALCIUM-VITAMIN D (OS-CAL 500 + D) 500-200 MG-UNIT TABLET    Take 1 tablet by mouth 2 (two) times daily.   DILTIAZEM (CARDIZEM CD) 240 MG 24 HR CAPSULE    Take 1 capsule (240 mg total) by mouth daily.   DIPHENOXYLATE-ATROPINE (LOMOTIL) 2.5-0.025 MG TABLET    Take 1 tablet by mouth 4 (four) times daily as needed for diarrhea or loose stools.   DOCUSATE SODIUM (COLACE) 100 MG CAPSULE    Take 1 capsule (100 mg total) by mouth 2 (two) times daily.   FEEDING SUPPLEMENT, ENSURE ENLIVE, (ENSURE ENLIVE) LIQD    Take 237 mLs by mouth 2 (two) times daily between meals.   MAGNESIUM OXIDE (MAG-OX) 400 MG TABLET    Take 400 mg by mouth 3 (three) times daily.   MEMANTINE (NAMENDA) 5 MG TABLET    Take 5 mg by mouth 2 (two) times daily.   MULTIPLE VITAMIN (MULTIVITAMIN WITH MINERALS) TABS TABLET    Take 1 tablet by mouth daily.   NON FORMULARY    Diet Type: NAS   OMEPRAZOLE (PRILOSEC) 20 MG CAPSULE    Take 1 capsule (20 mg total) by mouth every morning.   OSTOMY SUPPLIES (SKIN PREP WIPES) MISC    Apply to bilateral heels every shift for prevention   POLYETHYL GLYCOL-PROPYL GLYCOL (SYSTANE OP)    Place 1 drop into both eyes daily as needed. Dry Eyes    POLYETHYLENE GLYCOL (MIRALAX / GLYCOLAX) 17 G PACKET    Take 17 g by mouth daily as needed for mild constipation.   QUETIAPINE (SEROQUEL) 25 MG TABLET    Take 1 tablet (25 mg total) by mouth at bedtime.   SERTRALINE (ZOLOFT) 50 MG TABLET    Take 1 tablet (50 mg total) by mouth every morning.  Modified Medications   No medications on file  Discontinued Medications   MULTIPLE VITAMINS-MINERALS (HAIR SKIN AND NAILS FORMULA) TABS    Take 1 tablet by mouth daily.     SIGNIFICANT DIAGNOSTIC EXAMS  PREVIOUS:   11-24-18: chest x-ray:  1. Cardiac enlargement, aortic atherosclerosis and congestive heart failure. 2.  Aortic Atherosclerosis   11-25-18: ct  of head: Mild atrophy with patchy periventricular small vessel disease. No evident acute infarct. No mass or hemorrhage. Foci of arterial vascular calcification noted.  11-25-18: 2-d echo:  1. The left ventricle has hyperdynamic systolic function, with an ejection fraction of >65%. The cavity size was normal. Left ventricular diastolic Doppler parameters are indeterminate.  2. The right ventricle has moderately reduced systolic function. The cavity was mildly enlarged. There is no increase in right ventricular wall thickness.  3. Left atrial size was severely dilated.  4. Right atrial size was severely dilated.  5. The aortic valve is tricuspid. Mild thickening of the aortic valve. Moderate calcification of the aortic valve. Mild to moderate aortic annular calcification noted.  6. The mitral valve is abnormal. Moderate thickening of the mitral valve leaflet. Moderate calcification of the mitral valve leaflet. There is moderate mitral annular calcification present. Mild mitral valve stenosis.  7. MV is thickened, calcified with very mildly restricted motion.  8. The tricuspid valve is grossly normal. Tricuspid valve regurgitation is moderate.  9. The aortic root is normal in size and structure. 10. The inferior vena cava was dilated in size with <50% respiratory variability. 11. The interatrial septum was not assessed.  11-28-18: right hip x-ray: Probable nondisplaced intertrochanteric femur fracture on the right. Would confirm with CT prior to any surgery.  11-28-18: ct of head and cervical spine: 1. No evidence of intracranial injury or cervical spine fracture. 2. Cervical spine degeneration with notable calcified disc herniation at C5-6 that impinges on the cord.  11-28-18: ct of right hip: Nondisplaced intertrochanteric right femur fracture.  NO NEW EXAMS.   LABS REVIEWED PREVIOUS:   11-24-18: wbc 6.9; hgb 12.5; hct 40.0; mcv 85.3; plt 256; glucose 102; bun 18; creat 0.82; k+ 3.7; na++  142; ca 9.5 liver normal albumin 4.1 11-25-18: glucose 95; bun 14; creat 0.71; k+ 3.4; na++ 141; ca 8.9 mag 1.5 tsh 1.615 11-29-18: wbc 10.3; hgb 10.9; hct 35.0; mcv 84.3 plt 241 glucose 109; bun 17; creat 1.06 ;k+ 4.4; na++ 139  12-04-18: wbc 7.7; hgb 10.7; hct 34.3; mcv 92.3 plt 205 glucose 115; bun 14; creat 0.73; k+ 3.8; na++ 138; ca 9.5 12-05-18: wbc 7.2; hgb 10.0; hct 32.3; mcv 83.2; plt 224; glucose 94; bun 12; creat 0.82; k+ 4.2; na++ 139; ca 9.1  12-10-18: wbc 7.5; hgb 10.8; hct 35.6; mcv 84.8; plt 380 mag 1.6  12-16-18: mag 1.9   NO NEW LABS.   Review of Systems  Constitutional: Negative for malaise/fatigue.  Respiratory: Negative for cough.   Cardiovascular: Negative for chest pain and leg swelling.  Gastrointestinal: Negative for abdominal pain and constipation.  Musculoskeletal: Negative for back pain and joint pain.  Skin: Negative.   Neurological: Negative for dizziness.  Psychiatric/Behavioral: The patient is not nervous/anxious.     Physical Exam Constitutional:      General: She is not in acute distress.    Appearance: She is well-developed. She is not diaphoretic.     Comments: thin  Neck:     Musculoskeletal: Neck supple.     Thyroid: No thyromegaly.  Cardiovascular:     Rate and Rhythm: Normal rate and regular rhythm.     Pulses: Normal pulses.     Heart sounds: Normal heart sounds.  Pulmonary:     Effort: Pulmonary effort is normal. No respiratory distress.     Breath sounds: Normal breath sounds.  Abdominal:     General: Bowel sounds are normal. There is no distension.     Palpations: Abdomen is soft.     Tenderness: There is no abdominal tenderness.  Musculoskeletal:     Right lower leg: Edema present.  Left lower leg: Edema present.     Comments: Is able to move all extremities Is status post right intertrochanteric IM nailing Has trace bilateral lower extremity edema      Lymphadenopathy:     Cervical: No cervical adenopathy.  Skin:    General:  Skin is warm and dry.  Neurological:     Mental Status: She is alert. Mental status is at baseline.  Psychiatric:        Mood and Affect: Mood normal.     ASSESSMENT/ PLAN:  Patient is being discharged with the following home health services: pt/ot/rn: to evaluate and treat as indicated for gait balance strength adl training medication management.     Patient is being discharged with the following durable medical equipment:  None needed   Patient has been advised to f/u with their PCP in 1-2 weeks to bring them up to date on their rehab stay.  Social services at facility was responsible for arranging this appointment.  Pt was provided with a 30 day supply of prescriptions for medications and refills must be obtained from their PCP.  For controlled substances, a more limited supply may be provided adequate until PCP appointment only.  A 30 day supply of her prescription medications with #10 lomotil tabs to [facility]  Time spent with patient and discharge process: 35 minutes for home health needs; medications and dme.    Ok Edwards NP [facility]  Contact [phone] Monday through Friday 8am- 5pm  After hours call [phone]

## 2018-12-25 ENCOUNTER — Telehealth (HOSPITAL_COMMUNITY): Payer: Self-pay

## 2018-12-25 DIAGNOSIS — S72141D Displaced intertrochanteric fracture of right femur, subsequent encounter for closed fracture with routine healing: Secondary | ICD-10-CM | POA: Diagnosis not present

## 2018-12-25 NOTE — Telephone Encounter (Signed)
[facility] advising the pt that this referral will closed since their has been a letter mailed out to her on 12/03/2018 and has not heard back from her as of yet.

## 2018-12-29 DIAGNOSIS — Z9181 History of falling: Secondary | ICD-10-CM | POA: Diagnosis not present

## 2018-12-29 DIAGNOSIS — Z7901 Long term (current) use of anticoagulants: Secondary | ICD-10-CM | POA: Diagnosis not present

## 2018-12-29 DIAGNOSIS — K219 Gastro-esophageal reflux disease without esophagitis: Secondary | ICD-10-CM | POA: Diagnosis not present

## 2018-12-29 DIAGNOSIS — F0390 Unspecified dementia without behavioral disturbance: Secondary | ICD-10-CM | POA: Diagnosis not present

## 2018-12-29 DIAGNOSIS — S72141D Displaced intertrochanteric fracture of right femur, subsequent encounter for closed fracture with routine healing: Secondary | ICD-10-CM | POA: Diagnosis not present

## 2018-12-31 DIAGNOSIS — S72141D Displaced intertrochanteric fracture of right femur, subsequent encounter for closed fracture with routine healing: Secondary | ICD-10-CM | POA: Diagnosis not present

## 2018-12-31 DIAGNOSIS — K219 Gastro-esophageal reflux disease without esophagitis: Secondary | ICD-10-CM | POA: Diagnosis not present

## 2018-12-31 DIAGNOSIS — Z7901 Long term (current) use of anticoagulants: Secondary | ICD-10-CM | POA: Diagnosis not present

## 2018-12-31 DIAGNOSIS — Z9181 History of falling: Secondary | ICD-10-CM | POA: Diagnosis not present

## 2018-12-31 DIAGNOSIS — F0390 Unspecified dementia without behavioral disturbance: Secondary | ICD-10-CM | POA: Diagnosis not present

## 2019-01-15 DIAGNOSIS — F0390 Unspecified dementia without behavioral disturbance: Secondary | ICD-10-CM | POA: Diagnosis not present

## 2019-01-15 DIAGNOSIS — Z7901 Long term (current) use of anticoagulants: Secondary | ICD-10-CM | POA: Diagnosis not present

## 2019-01-15 DIAGNOSIS — S72141D Displaced intertrochanteric fracture of right femur, subsequent encounter for closed fracture with routine healing: Secondary | ICD-10-CM | POA: Diagnosis not present

## 2019-01-15 DIAGNOSIS — Z9181 History of falling: Secondary | ICD-10-CM | POA: Diagnosis not present

## 2019-01-15 DIAGNOSIS — K219 Gastro-esophageal reflux disease without esophagitis: Secondary | ICD-10-CM | POA: Diagnosis not present

## 2019-01-16 DIAGNOSIS — F419 Anxiety disorder, unspecified: Secondary | ICD-10-CM | POA: Diagnosis not present

## 2019-01-16 DIAGNOSIS — S72001A Fracture of unspecified part of neck of right femur, initial encounter for closed fracture: Secondary | ICD-10-CM | POA: Diagnosis not present

## 2019-01-16 DIAGNOSIS — I10 Essential (primary) hypertension: Secondary | ICD-10-CM | POA: Diagnosis not present

## 2019-01-17 DIAGNOSIS — K219 Gastro-esophageal reflux disease without esophagitis: Secondary | ICD-10-CM | POA: Diagnosis not present

## 2019-01-17 DIAGNOSIS — F0390 Unspecified dementia without behavioral disturbance: Secondary | ICD-10-CM | POA: Diagnosis not present

## 2019-01-17 DIAGNOSIS — Z7901 Long term (current) use of anticoagulants: Secondary | ICD-10-CM | POA: Diagnosis not present

## 2019-01-17 DIAGNOSIS — Z9181 History of falling: Secondary | ICD-10-CM | POA: Diagnosis not present

## 2019-01-17 DIAGNOSIS — S72141D Displaced intertrochanteric fracture of right femur, subsequent encounter for closed fracture with routine healing: Secondary | ICD-10-CM | POA: Diagnosis not present

## 2019-01-22 DIAGNOSIS — S72141D Displaced intertrochanteric fracture of right femur, subsequent encounter for closed fracture with routine healing: Secondary | ICD-10-CM | POA: Diagnosis not present

## 2019-02-12 ENCOUNTER — Ambulatory Visit: Payer: PPO | Admitting: Cardiology

## 2019-02-17 ENCOUNTER — Other Ambulatory Visit: Payer: Self-pay | Admitting: Adult Health

## 2019-02-27 DIAGNOSIS — K210 Gastro-esophageal reflux disease with esophagitis: Secondary | ICD-10-CM | POA: Diagnosis not present

## 2019-02-27 DIAGNOSIS — I482 Chronic atrial fibrillation, unspecified: Secondary | ICD-10-CM | POA: Diagnosis not present

## 2019-02-27 DIAGNOSIS — M810 Age-related osteoporosis without current pathological fracture: Secondary | ICD-10-CM | POA: Diagnosis not present

## 2019-02-27 DIAGNOSIS — I10 Essential (primary) hypertension: Secondary | ICD-10-CM | POA: Diagnosis not present

## 2019-02-28 ENCOUNTER — Other Ambulatory Visit: Payer: Self-pay

## 2019-02-28 ENCOUNTER — Encounter: Payer: Self-pay | Admitting: Cardiology

## 2019-02-28 ENCOUNTER — Other Ambulatory Visit (HOSPITAL_COMMUNITY)
Admission: RE | Admit: 2019-02-28 | Discharge: 2019-02-28 | Disposition: A | Discharge status: Home / Self Care | Payer: PPO | Source: Ambulatory Visit | Attending: Cardiology | Admitting: Cardiology

## 2019-02-28 ENCOUNTER — Ambulatory Visit (INDEPENDENT_AMBULATORY_CARE_PROVIDER_SITE_OTHER): Payer: PPO | Admitting: Cardiology

## 2019-02-28 VITALS — BP 109/58 | HR 85 | Temp 97.3°F | Ht 65.0 in | Wt 140.0 lb

## 2019-02-28 DIAGNOSIS — I4819 Other persistent atrial fibrillation: Secondary | ICD-10-CM | POA: Diagnosis not present

## 2019-02-28 DIAGNOSIS — I342 Nonrheumatic mitral (valve) stenosis: Secondary | ICD-10-CM | POA: Diagnosis not present

## 2019-02-28 DIAGNOSIS — Z79899 Other long term (current) drug therapy: Secondary | ICD-10-CM | POA: Insufficient documentation

## 2019-02-28 LAB — CBC WITH DIFFERENTIAL/PLATELET
Abs Immature Granulocytes: 0.03 10*3/uL (ref 0.00–0.07)
Basophils Absolute: 0 10*3/uL (ref 0.0–0.1)
Basophils Relative: 0 %
Eosinophils Absolute: 0.1 10*3/uL (ref 0.0–0.5)
Eosinophils Relative: 1 %
HCT: 35.2 % — ABNORMAL LOW (ref 36.0–46.0)
Hemoglobin: 10.7 g/dL — ABNORMAL LOW (ref 12.0–15.0)
Immature Granulocytes: 0 %
Lymphocytes Relative: 14 %
Lymphs Abs: 1.3 10*3/uL (ref 0.7–4.0)
MCH: 26 pg (ref 26.0–34.0)
MCHC: 30.4 g/dL (ref 30.0–36.0)
MCV: 85.4 fL (ref 80.0–100.0)
Monocytes Absolute: 0.7 10*3/uL (ref 0.1–1.0)
Monocytes Relative: 7 %
Neutro Abs: 7.1 10*3/uL (ref 1.7–7.7)
Neutrophils Relative %: 78 %
Platelets: 255 10*3/uL (ref 150–400)
RBC: 4.12 MIL/uL (ref 3.87–5.11)
RDW: 18.1 % — ABNORMAL HIGH (ref 11.5–15.5)
WBC: 9.2 10*3/uL (ref 4.0–10.5)
nRBC: 0 % (ref 0.0–0.2)

## 2019-02-28 LAB — BASIC METABOLIC PANEL
Anion gap: 8 (ref 5–15)
BUN: 21 mg/dL (ref 8–23)
CO2: 25 mmol/L (ref 22–32)
Calcium: 9.7 mg/dL (ref 8.9–10.3)
Chloride: 105 mmol/L (ref 98–111)
Creatinine, Ser: 0.93 mg/dL (ref 0.44–1.00)
GFR calc Af Amer: >60 mL/min (ref >60)
GFR calc non Af Amer: 56 mL/min — ABNORMAL LOW (ref >60)
Glucose, Bld: 103 mg/dL — ABNORMAL HIGH (ref 70–99)
Potassium: 5 mmol/L (ref 3.5–5.1)
Sodium: 138 mmol/L (ref 135–145)

## 2019-02-28 NOTE — Progress Notes (Signed)
Cardiology Office Note  Date: 02/28/2019   ID: Kristy Mckinney, DOB Feb 03, 1932, MRN [id number]  PCP:  [name], MD  Cardiologist:  Rozann Lesches, MD Electrophysiologist:  None   Chief Complaint  Patient presents with  . Atrial Fibrillation    History of Present Illness: Kristy Mckinney is an 85 y.o. female that I met in consultation back in May with newly documented atrial fibrillation and CHADSVASC score of 4.  She was managed with Cardizem CD and Eliquis at that time.  She presents today for a follow-up visit.  She tells me that prior breathlessness and sense of palpitations have improved.  She does not report any bleeding problems or changes in stool.  I reviewed her medications, she continues on Eliquis which we have To 2.5 mg twice daily and also Cardizem CD at 240 mg daily.  Heart rate is well controlled today.  Past Medical History:  Diagnosis Date  . Atrial fibrillation ([place])   . Chronic back pain   . Diarrhea   . Dysphagia 05/03/2016  . GERD (gastroesophageal reflux disease)     Past Surgical History:  Procedure Laterality Date  . ABDOMINAL HYSTERECTOMY    . BACK SURGERY    . CHOLECYSTECTOMY N/A 01/29/2014   Procedure: LAPAROSCOPIC CHOLECYSTECTOMY;  Surgeon: Scherry Ran, MD;  Location: [facility];  Service: General;  Laterality: N/A;  . COLONOSCOPY N/A 10/27/2015   Procedure: COLONOSCOPY;  Surgeon: Rogene Houston, MD;  [facility];  Service: Endoscopy;  Laterality: N/A;  215  . INTRAMEDULLARY (IM) NAIL INTERTROCHANTERIC Right 11/29/2018   Procedure: INTRAMEDULLARY (IM) NAIL INTERTROCHANTRIC FRACTURE;  Surgeon: Renette Butters, MD;  Location: [facility];  Service: Orthopedics;  Laterality: Right;  . TONSILLECTOMY    . YAG LASER APPLICATION Left 76/19/5093   Procedure: YAG LASER APPLICATION;  Surgeon: Elta Guadeloupe T. Gershon Crane, MD;  Location: [facility];  Service: Ophthalmology;  Laterality: Left;  left    Current Outpatient Medications  Medication Sig  Dispense Refill  . acetaminophen (TYLENOL) 325 MG tablet Take 650 mg by mouth every 6 (six) hours as needed.    Marland Kitchen alendronate (FOSAMAX) 70 MG tablet Take 1 tablet (70 mg total) by mouth once a week. Take with a full glass of water on an empty stomach. 4 tablet 0  . apixaban (ELIQUIS) 2.5 MG TABS tablet Take 1 tablet (2.5 mg total) by mouth 2 (two) times daily. 60 tablet 0  . diltiazem (CARDIZEM CD) 240 MG 24 hr capsule Take 1 capsule (240 mg total) by mouth daily. 30 capsule 0  . magnesium oxide (MAG-OX) 400 MG tablet Take 1 tablet (400 mg total) by mouth 3 (three) times daily. 90 tablet 0  . memantine (NAMENDA) 5 MG tablet Take 1 tablet (5 mg total) by mouth 2 (two) times daily. 60 tablet 0  . Multiple Vitamin (MULTIVITAMIN WITH MINERALS) TABS tablet Take 1 tablet by mouth daily.    . NON FORMULARY Diet Type: NAS    . omeprazole (PRILOSEC) 20 MG capsule Take 1 capsule (20 mg total) by mouth every morning.    Vladimir Faster Glycol-Propyl Glycol (SYSTANE OP) Place 1 drop into both eyes daily as needed. Dry Eyes     . QUEtiapine (SEROQUEL) 25 MG tablet Take 1 tablet (25 mg total) by mouth at bedtime. 30 tablet 0  . sertraline (ZOLOFT) 50 MG tablet Take 1 tablet (50 mg total) by mouth every morning. 30 tablet 0  . Balsam [place]-Castor Oil (VENELEX) OINT  Apply topically to sacrum and bilateral buttocks every shift and as needed for prevention    . calcium-vitamin D (OS-CAL 500 + D) 500-200 MG-UNIT tablet Take 1 tablet by mouth 2 (two) times daily. (Patient not taking: Reported on 02/28/2019)    . diphenoxylate-atropine (LOMOTIL) 2.5-0.025 MG tablet Take 1 tablet by mouth 4 (four) times daily as needed for diarrhea or loose stools. (Patient not taking: Reported on 02/28/2019) 10 tablet 0  . docusate sodium (COLACE) 100 MG capsule Take 1 capsule (100 mg total) by mouth 2 (two) times daily. (Patient not taking: Reported on 02/28/2019) 10 capsule 0  . feeding supplement, ENSURE ENLIVE, (ENSURE ENLIVE) LIQD Take 237  mLs by mouth 2 (two) times daily between meals. (Patient not taking: Reported on 02/28/2019) 237 mL 12  . Ostomy Supplies (SKIN PREP WIPES) MISC Apply to bilateral heels every shift for prevention    . polyethylene glycol (MIRALAX / GLYCOLAX) 17 g packet Take 17 g by mouth daily as needed for mild constipation. (Patient not taking: Reported on 02/28/2019) 14 each 0   No current facility-administered medications for this visit.    Allergies:  Penicillins, Emetrol, Feldene [piroxicam], and Sulfa antibiotics   Social History: The patient  reports that she has never smoked. She has never used smokeless tobacco. She reports that she does not drink alcohol or use drugs.   ROS:  Please see the history of present illness. Otherwise, complete review of systems is positive for memory loss.  All other systems are reviewed and negative.   Physical Exam: VS:  BP (!) 109/58   Pulse 85   Temp (!) 97.3 F (36.3 C)   Ht 5' 5" (1.651 m)   Wt 140 lb (63.5 kg)   BMI 23.30 kg/m , BMI Body mass index is 23.3 kg/m.  Wt Readings from Last 3 Encounters:  02/28/19 140 lb (63.5 kg)  12/23/18 135 lb 12.8 oz (61.6 kg)  12/18/18 135 lb 12.8 oz (61.6 kg)    General: Elderly woman, appears comfortable at rest. HEENT: Conjunctiva and lids normal, wearing a mask. Neck: Supple, no elevated JVP or carotid bruits, no thyromegaly. Lungs: Clear to auscultation, nonlabored breathing at rest. Cardiac: Irregularly irregular, no S3 or gallop. Abdomen: Soft, nontender, bowel sounds present. Extremities: No pitting edema, distal pulses 2+. Skin: Warm and dry. Musculoskeletal: No kyphosis. Neuropsychiatric: Alert and oriented x3, affect grossly appropriate.  ECG:  An ECG dated 12/03/2018 was personally reviewed today and demonstrated:  Atrial fibrillation with RVR and leftward axis.  Recent Labwork: 11/24/2018: ALT 20; AST 30 11/25/2018: TSH 1.615 12/19/2018: BUN 25; Creatinine, Ser 0.62; Hemoglobin 11.1; Magnesium 1.8;  Platelets 309; Potassium 4.0; Sodium 141   Other Studies Reviewed Today:  Echocardiogram 11/25/2018:  1. The left ventricle has hyperdynamic systolic function, with an ejection fraction of >65%. The cavity size was normal. Left ventricular diastolic Doppler parameters are indeterminate.  2. The right ventricle has moderately reduced systolic function. The cavity was mildly enlarged. There is no increase in right ventricular wall thickness.  3. Left atrial size was severely dilated.  4. Right atrial size was severely dilated.  5. The aortic valve is tricuspid. Mild thickening of the aortic valve. Moderate calcification of the aortic valve. Mild to moderate aortic annular calcification noted.  6. The mitral valve is abnormal. Moderate thickening of the mitral valve leaflet. Moderate calcification of the mitral valve leaflet. There is moderate mitral annular calcification present. Mild mitral valve stenosis.  7. MV is thickened, calcified  with very mildly restricted motion.  8. The tricuspid valve is grossly normal. Tricuspid valve regurgitation is moderate.  9. The aortic root is normal in size and structure. 10. The inferior vena cava was dilated in size with <50% respiratory variability. 11. The interatrial septum was not assessed.  Assessment and Plan:  1.  Persistent atrial fibrillation, we will likely manage as permanent atrial fibrillation with strategy of heart rate control and anticoagulation since she seems to be tolerating this well at this time.  Continue Eliquis at 2.5 mg twice daily (age and borderline weight).  Follow-up CBC and BMET.  2.  Very mild mitral stenosis with calcified valve by echocardiogram in May.  Left atrium is severely dilated.  Medication Adjustments/Labs and Tests Ordered: Current medicines are reviewed at length with the patient today.  Concerns regarding medicines are outlined above.   Tests Ordered: Orders Placed This Encounter  Procedures  . CBC with  Differential  . Basic Metabolic Panel (BMET)    Medication Changes: No orders of the defined types were placed in this encounter.   Disposition:  Follow up 6 months in the [place] office.  Signed, Satira Sark, MD, [facility] 02/28/2019 3:01 PM    [place] [facility] [address] [place], [place] [address] Phone: [phone]; Fax: [phone]

## 2019-02-28 NOTE — Patient Instructions (Signed)
Medication Instructions:  Your physician recommends that you continue on your current medications as directed. Please refer to the Current Medication list given to you today.   Labwork: CBC BMET   Testing/Procedures: NONE  Follow-Up: Your physician wants you to follow-up in: 6 MONTHS.  You will receive a reminder letter in the mail two months in advance. If you don't receive a letter, please call our office to schedule the follow-up appointment.   Any Other Special Instructions Will Be Listed Below (If Applicable).     If you need a refill on your cardiac medications before your next appointment, please call your pharmacy.

## 2019-03-26 DIAGNOSIS — S72141D Displaced intertrochanteric fracture of right femur, subsequent encounter for closed fracture with routine healing: Secondary | ICD-10-CM | POA: Diagnosis not present

## 2019-04-04 ENCOUNTER — Emergency Department (HOSPITAL_COMMUNITY): Payer: PPO

## 2019-04-04 ENCOUNTER — Other Ambulatory Visit: Payer: Self-pay

## 2019-04-04 ENCOUNTER — Encounter (HOSPITAL_COMMUNITY): Payer: Self-pay

## 2019-04-04 ENCOUNTER — Inpatient Hospital Stay (HOSPITAL_COMMUNITY)
Admission: EM | Admit: 2019-04-04 | Discharge: 2019-04-08 | DRG: 308 | Disposition: A | Discharge status: Home / Self Care | Payer: PPO | Attending: Pulmonary Disease | Admitting: Pulmonary Disease

## 2019-04-04 ENCOUNTER — Observation Stay (HOSPITAL_COMMUNITY): Payer: PPO

## 2019-04-04 ENCOUNTER — Observation Stay (HOSPITAL_BASED_OUTPATIENT_CLINIC_OR_DEPARTMENT_OTHER): Payer: PPO

## 2019-04-04 DIAGNOSIS — I5031 Acute diastolic (congestive) heart failure: Secondary | ICD-10-CM

## 2019-04-04 DIAGNOSIS — Z7901 Long term (current) use of anticoagulants: Secondary | ICD-10-CM

## 2019-04-04 DIAGNOSIS — Z8249 Family history of ischemic heart disease and other diseases of the circulatory system: Secondary | ICD-10-CM

## 2019-04-04 DIAGNOSIS — I4811 Longstanding persistent atrial fibrillation: Secondary | ICD-10-CM | POA: Diagnosis not present

## 2019-04-04 DIAGNOSIS — I110 Hypertensive heart disease with heart failure: Secondary | ICD-10-CM | POA: Diagnosis not present

## 2019-04-04 DIAGNOSIS — Z79899 Other long term (current) drug therapy: Secondary | ICD-10-CM | POA: Diagnosis not present

## 2019-04-04 DIAGNOSIS — I340 Nonrheumatic mitral (valve) insufficiency: Secondary | ICD-10-CM | POA: Diagnosis not present

## 2019-04-04 DIAGNOSIS — F0390 Unspecified dementia without behavioral disturbance: Secondary | ICD-10-CM | POA: Diagnosis not present

## 2019-04-04 DIAGNOSIS — R0600 Dyspnea, unspecified: Secondary | ICD-10-CM

## 2019-04-04 DIAGNOSIS — Z20828 Contact with and (suspected) exposure to other viral communicable diseases: Secondary | ICD-10-CM | POA: Diagnosis not present

## 2019-04-04 DIAGNOSIS — R0602 Shortness of breath: Secondary | ICD-10-CM | POA: Diagnosis not present

## 2019-04-04 DIAGNOSIS — Z23 Encounter for immunization: Secondary | ICD-10-CM

## 2019-04-04 DIAGNOSIS — I482 Chronic atrial fibrillation, unspecified: Secondary | ICD-10-CM | POA: Diagnosis not present

## 2019-04-04 DIAGNOSIS — Z882 Allergy status to sulfonamides status: Secondary | ICD-10-CM | POA: Diagnosis not present

## 2019-04-04 DIAGNOSIS — Z9049 Acquired absence of other specified parts of digestive tract: Secondary | ICD-10-CM | POA: Diagnosis not present

## 2019-04-04 DIAGNOSIS — Z90710 Acquired absence of both cervix and uterus: Secondary | ICD-10-CM | POA: Diagnosis not present

## 2019-04-04 DIAGNOSIS — F419 Anxiety disorder, unspecified: Secondary | ICD-10-CM | POA: Diagnosis present

## 2019-04-04 DIAGNOSIS — M810 Age-related osteoporosis without current pathological fracture: Secondary | ICD-10-CM | POA: Diagnosis not present

## 2019-04-04 DIAGNOSIS — M79604 Pain in right leg: Secondary | ICD-10-CM | POA: Diagnosis not present

## 2019-04-04 DIAGNOSIS — I4891 Unspecified atrial fibrillation: Secondary | ICD-10-CM | POA: Diagnosis present

## 2019-04-04 DIAGNOSIS — Z888 Allergy status to other drugs, medicaments and biological substances status: Secondary | ICD-10-CM | POA: Diagnosis not present

## 2019-04-04 DIAGNOSIS — F329 Major depressive disorder, single episode, unspecified: Secondary | ICD-10-CM | POA: Diagnosis not present

## 2019-04-04 DIAGNOSIS — K219 Gastro-esophageal reflux disease without esophagitis: Secondary | ICD-10-CM | POA: Diagnosis not present

## 2019-04-04 DIAGNOSIS — R296 Repeated falls: Secondary | ICD-10-CM | POA: Diagnosis not present

## 2019-04-04 DIAGNOSIS — M79605 Pain in left leg: Secondary | ICD-10-CM | POA: Diagnosis not present

## 2019-04-04 DIAGNOSIS — I083 Combined rheumatic disorders of mitral, aortic and tricuspid valves: Secondary | ICD-10-CM | POA: Diagnosis not present

## 2019-04-04 DIAGNOSIS — G8929 Other chronic pain: Secondary | ICD-10-CM | POA: Diagnosis present

## 2019-04-04 DIAGNOSIS — I5041 Acute combined systolic (congestive) and diastolic (congestive) heart failure: Secondary | ICD-10-CM | POA: Diagnosis not present

## 2019-04-04 DIAGNOSIS — I2699 Other pulmonary embolism without acute cor pulmonale: Secondary | ICD-10-CM

## 2019-04-04 DIAGNOSIS — M79662 Pain in left lower leg: Secondary | ICD-10-CM | POA: Diagnosis not present

## 2019-04-04 DIAGNOSIS — M79661 Pain in right lower leg: Secondary | ICD-10-CM | POA: Diagnosis not present

## 2019-04-04 DIAGNOSIS — I509 Heart failure, unspecified: Secondary | ICD-10-CM

## 2019-04-04 DIAGNOSIS — I361 Nonrheumatic tricuspid (valve) insufficiency: Secondary | ICD-10-CM

## 2019-04-04 DIAGNOSIS — I5033 Acute on chronic diastolic (congestive) heart failure: Secondary | ICD-10-CM | POA: Diagnosis present

## 2019-04-04 DIAGNOSIS — I4821 Permanent atrial fibrillation: Principal | ICD-10-CM | POA: Diagnosis present

## 2019-04-04 DIAGNOSIS — Z880 Allergy status to penicillin: Secondary | ICD-10-CM | POA: Diagnosis not present

## 2019-04-04 LAB — TROPONIN I (HIGH SENSITIVITY)
Troponin I (High Sensitivity): 8 ng/L (ref <18)
Troponin I (High Sensitivity): 8 ng/L (ref <18)

## 2019-04-04 LAB — CBC
HCT: 34.9 % — ABNORMAL LOW (ref 36.0–46.0)
Hemoglobin: 10.8 g/dL — ABNORMAL LOW (ref 12.0–15.0)
MCH: 26.6 pg (ref 26.0–34.0)
MCHC: 30.9 g/dL (ref 30.0–36.0)
MCV: 86 fL (ref 80.0–100.0)
Platelets: 198 10*3/uL (ref 150–400)
RBC: 4.06 MIL/uL (ref 3.87–5.11)
RDW: 17.1 % — ABNORMAL HIGH (ref 11.5–15.5)
WBC: 5.3 10*3/uL (ref 4.0–10.5)
nRBC: 0 % (ref 0.0–0.2)

## 2019-04-04 LAB — BASIC METABOLIC PANEL
Anion gap: 10 (ref 5–15)
BUN: 19 mg/dL (ref 8–23)
CO2: 24 mmol/L (ref 22–32)
Calcium: 9.2 mg/dL (ref 8.9–10.3)
Chloride: 105 mmol/L (ref 98–111)
Creatinine, Ser: 0.72 mg/dL (ref 0.44–1.00)
GFR calc Af Amer: >60 mL/min (ref >60)
GFR calc non Af Amer: >60 mL/min (ref >60)
Glucose, Bld: 119 mg/dL — ABNORMAL HIGH (ref 70–99)
Potassium: 3.7 mmol/L (ref 3.5–5.1)
Sodium: 139 mmol/L (ref 135–145)

## 2019-04-04 LAB — CBG MONITORING, ED: Glucose-Capillary: 102 mg/dL — ABNORMAL HIGH (ref 70–99)

## 2019-04-04 LAB — D-DIMER, QUANTITATIVE: D-Dimer, Quant: 1.03 ug/mL-FEU — ABNORMAL HIGH (ref 0.00–0.50)

## 2019-04-04 LAB — SARS CORONAVIRUS 2 (TAT 6-24 HRS): SARS Coronavirus 2: NEGATIVE

## 2019-04-04 LAB — ECHOCARDIOGRAM COMPLETE
Height: 64.5 in
Weight: 2176 oz

## 2019-04-04 LAB — BRAIN NATRIURETIC PEPTIDE: B Natriuretic Peptide: 305 pg/mL — ABNORMAL HIGH (ref 0.0–100.0)

## 2019-04-04 LAB — TSH: TSH: 2.532 u[IU]/mL (ref 0.350–4.500)

## 2019-04-04 MED ORDER — ACETAMINOPHEN 325 MG PO TABS: 650.0000 mg | ORAL_TABLET | Freq: Four times a day (QID) | ORAL | Status: DC | PRN

## 2019-04-04 MED ORDER — DILTIAZEM LOAD VIA INFUSION
15.0000 mg | Freq: Once | INTRAVENOUS | Status: AC
  Administered 2019-04-04: 09:00:00 15 mg via INTRAVENOUS
  Filled 2019-04-04: qty 15

## 2019-04-04 MED ORDER — CALCIUM CARBONATE-VITAMIN D 500-200 MG-UNIT PO TABS
1.0000 | ORAL_TABLET | Freq: Two times a day (BID) | ORAL | Status: DC
  Administered 2019-04-04 – 2019-04-08 (×8): 1 via ORAL
  Filled 2019-04-04 (×8): qty 1

## 2019-04-04 MED ORDER — DILTIAZEM HCL 100 MG IV SOLR
5.0000 mg/h | INTRAVENOUS | Status: DC
  Administered 2019-04-04: 09:00:00 5 mg/h via INTRAVENOUS
  Filled 2019-04-04: qty 100

## 2019-04-04 MED ORDER — TRAZODONE HCL 50 MG PO TABS
50.0000 mg | ORAL_TABLET | Freq: Every evening | ORAL | Status: DC | PRN
  Administered 2019-04-08: 50 mg via ORAL
  Filled 2019-04-04: qty 1

## 2019-04-04 MED ORDER — MAGNESIUM OXIDE 400 (241.3 MG) MG PO TABS
400.0000 mg | ORAL_TABLET | Freq: Three times a day (TID) | ORAL | Status: DC
  Administered 2019-04-04 – 2019-04-08 (×11): 400 mg via ORAL
  Filled 2019-04-04 (×11): qty 1

## 2019-04-04 MED ORDER — ONDANSETRON HCL 4 MG/2ML IJ SOLN: 4.0000 mg | Freq: Four times a day (QID) | INTRAMUSCULAR | Status: DC | PRN

## 2019-04-04 MED ORDER — ADULT MULTIVITAMIN W/MINERALS CH
1.0000 | ORAL_TABLET | Freq: Every day | ORAL | Status: DC
  Administered 2019-04-05 – 2019-04-08 (×4): 1 via ORAL
  Filled 2019-04-04 (×4): qty 1

## 2019-04-04 MED ORDER — MEMANTINE HCL 10 MG PO TABS
5.0000 mg | ORAL_TABLET | Freq: Two times a day (BID) | ORAL | Status: DC
  Administered 2019-04-04 – 2019-04-08 (×8): 5 mg via ORAL
  Filled 2019-04-04 (×8): qty 1

## 2019-04-04 MED ORDER — ONDANSETRON HCL 4 MG PO TABS: 4.0000 mg | ORAL_TABLET | Freq: Four times a day (QID) | ORAL | Status: DC | PRN

## 2019-04-04 MED ORDER — PANTOPRAZOLE SODIUM 40 MG PO TBEC
40.0000 mg | DELAYED_RELEASE_TABLET | Freq: Every day | ORAL | Status: DC
  Administered 2019-04-05 – 2019-04-08 (×4): 40 mg via ORAL
  Filled 2019-04-04 (×4): qty 1

## 2019-04-04 MED ORDER — SODIUM CHLORIDE 0.9% FLUSH: 3.0000 mL | INTRAVENOUS | Status: DC | PRN

## 2019-04-04 MED ORDER — FUROSEMIDE 10 MG/ML IJ SOLN
20.0000 mg | Freq: Once | INTRAMUSCULAR | Status: AC
  Administered 2019-04-04: 20 mg via INTRAVENOUS
  Filled 2019-04-04: qty 2

## 2019-04-04 MED ORDER — ENSURE ENLIVE PO LIQD
237.0000 mL | Freq: Two times a day (BID) | ORAL | Status: DC
  Administered 2019-04-05 – 2019-04-06 (×4): 237 mL via ORAL

## 2019-04-04 MED ORDER — APIXABAN 5 MG PO TABS
5.0000 mg | ORAL_TABLET | Freq: Two times a day (BID) | ORAL | Status: DC
  Administered 2019-04-04 – 2019-04-08 (×9): 5 mg via ORAL
  Filled 2019-04-04 (×9): qty 1

## 2019-04-04 MED ORDER — SODIUM CHLORIDE 0.9% FLUSH
3.0000 mL | Freq: Two times a day (BID) | INTRAVENOUS | Status: DC
  Administered 2019-04-05 – 2019-04-07 (×6): 3 mL via INTRAVENOUS

## 2019-04-04 MED ORDER — METOPROLOL TARTRATE 5 MG/5ML IV SOLN
5.0000 mg | INTRAVENOUS | Status: AC | PRN
  Administered 2019-04-05 (×2): 5 mg via INTRAVENOUS
  Filled 2019-04-04 (×2): qty 5

## 2019-04-04 MED ORDER — SERTRALINE HCL 50 MG PO TABS
50.0000 mg | ORAL_TABLET | Freq: Every morning | ORAL | Status: DC
  Administered 2019-04-05 – 2019-04-08 (×4): 50 mg via ORAL
  Filled 2019-04-04 (×4): qty 1

## 2019-04-04 MED ORDER — ALBUTEROL SULFATE (2.5 MG/3ML) 0.083% IN NEBU: 2.5000 mg | INHALATION_SOLUTION | RESPIRATORY_TRACT | Status: DC | PRN

## 2019-04-04 MED ORDER — DILTIAZEM HCL 60 MG PO TABS
60.0000 mg | ORAL_TABLET | Freq: Four times a day (QID) | ORAL | Status: DC
  Administered 2019-04-04 – 2019-04-05 (×3): 60 mg via ORAL
  Filled 2019-04-04: qty 1
  Filled 2019-04-04: qty 2
  Filled 2019-04-04: qty 1

## 2019-04-04 MED ORDER — POLYETHYLENE GLYCOL 3350 17 G PO PACK: 17.0000 g | PACK | Freq: Every day | ORAL | Status: DC | PRN

## 2019-04-04 MED ORDER — SODIUM CHLORIDE 0.9 % IV SOLN: 250.0000 mL | INTRAVENOUS | Status: DC | PRN

## 2019-04-04 MED ORDER — QUETIAPINE FUMARATE 25 MG PO TABS
25.0000 mg | ORAL_TABLET | Freq: Every day | ORAL | Status: DC
  Administered 2019-04-04 – 2019-04-07 (×4): 25 mg via ORAL
  Filled 2019-04-04 (×4): qty 1

## 2019-04-04 MED ORDER — ACETAMINOPHEN 650 MG RE SUPP: 650.0000 mg | Freq: Four times a day (QID) | RECTAL | Status: DC | PRN

## 2019-04-04 NOTE — ED Triage Notes (Signed)
Pt had some mild chest pain for several days, then past couple of days with sob.  Pt denies pain at this time.

## 2019-04-04 NOTE — Consult Note (Addendum)
Cardiology Consult    Patient ID: Kristy Mckinney; [id number]; September 15, 1931   Admit date: 04/04/2019 Date of Consult: 04/04/2019  Primary Care Provider: [name], MD Primary Cardiologist: Rozann Lesches, MD   Patient Profile    Kristy Mckinney is a 85 y.o. female with past medical history of persistent atrial fibrillation (diagnosed in 11/2018 with rate-control strategy pursued), mitral stenosis, and chronic back pain who is being seen today for the evaluation of atrial fibrillation with RVR at the request of Dr. Denton Brick.  History of Present Illness    Ms. Clementi was last examined by Dr. Domenic Polite in 02/2019 and reported improvement in her dyspnea since her prior hospitalization in 11/2018. She was continued on Eliquis for anticoagulation along with Cardizem CD 240 mg daily for rate control with the recommendation to likely manage as permanent atrial fibrillation.  She presented to [facility] ED this morning for evaluation of dyspnea for the past few days. Says that starting last week she noticed she was more short of breath with ambulation at the grocery store. Over the past 3 days, her dyspnea has worsened and she notices this mostly with exertion. Denies any associated chest pain or palpitations. She does not have a way of checking her HR or BP at home and is unsure what her vitals have been. Has also not weighed recently (was 136 lbs in 02/2019). She does report bilateral leg pain which has been present for several weeks which typically occurs with exertion. She also mentions having jaw pain when she eats but this is not associated with activity or positional changes. Has a known history of TMJ.   Initial labs show WBC 5.3, Hgb 10.8, platelets 198, Na+ 139, K+ 3.7, and creatinine 0.72. BNP 305. TSH 2.532. CXR consistent with CHF showing small bilateral pleural effusions. EKG shows atrial fibrillation with RVR, HR 134, with LAFB. No acute ST changes when compared to prior tracings.     HR was initially in the 130's to 140's. Currently on IV Cardizem at 5 mg/hr with HR in the low-100's at rest but increasing into the 140's with ambulation to the bedside commode.    Past Medical History:  Diagnosis Date  . Atrial fibrillation ([place])   . Chronic back pain   . Diarrhea   . Dysphagia 05/03/2016  . GERD (gastroesophageal reflux disease)     Past Surgical History:  Procedure Laterality Date  . ABDOMINAL HYSTERECTOMY    . BACK SURGERY    . CHOLECYSTECTOMY N/A 01/29/2014   Procedure: LAPAROSCOPIC CHOLECYSTECTOMY;  Surgeon: Scherry Ran, MD;  Location: [facility];  Service: General;  Laterality: N/A;  . COLONOSCOPY N/A 10/27/2015   Procedure: COLONOSCOPY;  Surgeon: Rogene Houston, MD;  Location: [facility];  Service: Endoscopy;  Laterality: N/A;  215  . INTRAMEDULLARY (IM) NAIL INTERTROCHANTERIC Right 11/29/2018   Procedure: INTRAMEDULLARY (IM) NAIL INTERTROCHANTRIC FRACTURE;  Surgeon: Renette Butters, MD;  Location: [place];  Service: Orthopedics;  Laterality: Right;  . TONSILLECTOMY    . YAG LASER APPLICATION Left [id number]   Procedure: YAG LASER APPLICATION;  Surgeon: Elta Guadeloupe T. Gershon Crane, MD;  Location: [facility];  Service: Ophthalmology;  Laterality: Left;  left     Home Medications:  Prior to Admission medications   Medication Sig Start Date End Date Taking? Authorizing Provider  acetaminophen (TYLENOL) 325 MG tablet Take 650 mg by mouth every 6 (six) hours as needed. 12/05/18   [provider]  alendronate (FOSAMAX) 70 MG tablet  Take 1 tablet (70 mg total) by mouth once a week. Take with a full glass of water on an empty stomach. 12/23/18   Gerlene Fee, NP  apixaban (ELIQUIS) 2.5 MG TABS tablet Take 1 tablet (2.5 mg total) by mouth 2 (two) times daily. 12/23/18   Gerlene Fee, NP  Roseanne Kaufman [place] [facility]) OINT Apply topically to sacrum and bilateral buttocks every shift and as needed for prevention 12/06/18   [provider]   calcium-vitamin D (OS-CAL 500 + D) 500-200 MG-UNIT tablet Take 1 tablet by mouth 2 (two) times daily. Patient not taking: Reported on 02/28/2019 12/05/18   Desiree Hane, MD  diltiazem (CARDIZEM CD) 240 MG 24 hr capsule Take 1 capsule (240 mg total) by mouth daily. 12/23/18   Gerlene Fee, NP  diphenoxylate-atropine (LOMOTIL) 2.5-0.025 MG tablet Take 1 tablet by mouth 4 (four) times daily as needed for diarrhea or loose stools. Patient not taking: Reported on 02/28/2019 12/23/18   Gerlene Fee, NP  docusate sodium (COLACE) 100 MG capsule Take 1 capsule (100 mg total) by mouth 2 (two) times daily. Patient not taking: Reported on 02/28/2019 12/05/18   Oretha Milch D, MD  feeding supplement, ENSURE ENLIVE, (ENSURE ENLIVE) LIQD Take 237 mLs by mouth 2 (two) times daily between meals. Patient not taking: Reported on 02/28/2019 12/05/18   Oretha Milch D, MD  magnesium oxide (MAG-OX) 400 MG tablet Take 1 tablet (400 mg total) by mouth 3 (three) times daily. 12/23/18   Gerlene Fee, NP  memantine (NAMENDA) 5 MG tablet Take 1 tablet (5 mg total) by mouth 2 (two) times daily. 12/23/18   Gerlene Fee, NP  Multiple Vitamin (MULTIVITAMIN WITH MINERALS) TABS tablet Take 1 tablet by mouth daily. 12/05/18   Desiree Hane, MD  NON FORMULARY Diet Type: NAS 12/05/18   [provider]  omeprazole (PRILOSEC) 20 MG capsule Take 1 capsule (20 mg total) by mouth every morning. 12/05/18   Desiree Hane, MD  Ostomy Supplies (SKIN PREP WIPES) MISC Apply to bilateral heels every shift for prevention 12/06/18   [provider]  Polyethyl Glycol-Propyl Glycol (SYSTANE OP) Place 1 drop into both eyes daily as needed. Dry Eyes     [provider]  polyethylene glycol (MIRALAX / GLYCOLAX) 17 g packet Take 17 g by mouth daily as needed for mild constipation. Patient not taking: Reported on 02/28/2019 12/05/18   Oretha Milch D, MD  QUEtiapine (SEROQUEL) 25 MG tablet Take 1 tablet (25 mg  total) by mouth at bedtime. 12/23/18   Gerlene Fee, NP  sertraline (ZOLOFT) 50 MG tablet Take 1 tablet (50 mg total) by mouth every morning. 12/23/18   Gerlene Fee, NP    Inpatient Medications: Scheduled Meds: . apixaban  5 mg Oral BID   Continuous Infusions: . diltiazem (CARDIZEM) infusion 5 mg/hr (04/04/19 0837)   PRN Meds:   Allergies:    Allergies  Allergen Reactions  . Penicillins Anaphylaxis  . Emetrol Nausea Only    swelling  . Feldene [Piroxicam] Nausea And Vomiting  . Sulfa Antibiotics     swelling    Social History:   Social History   Socioeconomic History  . Marital status: Married    Spouse name: Not on file  . Number of children: Not on file  . Years of education: Not on file  . Highest education level: Not on file  Occupational History  . Not on file  Social Needs  .  Financial resource strain: Not on file  . Food insecurity    Worry: Not on file    Inability: Not on file  . Transportation needs    Medical: Not on file    Non-medical: Not on file  Tobacco Use  . Smoking status: Never Smoker  . Smokeless tobacco: Never Used  Substance and Sexual Activity  . Alcohol use: No  . Drug use: No  . Sexual activity: Yes    Birth control/protection: Surgical  Lifestyle  . Physical activity    Days per week: Not on file    Minutes per session: Not on file  . Stress: Not on file  Relationships  . Social Herbalist on phone: Not on file    Gets together: Not on file    Attends religious service: Not on file    Active member of club or organization: Not on file    Attends meetings of clubs or organizations: Not on file    Relationship status: Not on file  . Intimate partner violence    Fear of current or ex partner: Not on file    Emotionally abused: Not on file    Physically abused: Not on file    Forced sexual activity: Not on file  Other Topics Concern  . Not on file  Social History Narrative  . Not on file     Family  History:    Family History  Problem Relation Age of Onset  . Hypertension Child       Review of Systems    General:  No chills, fever, night sweats or weight changes.  Cardiovascular:  No chest pain, edema, orthopnea, palpitations, paroxysmal nocturnal dyspnea. Positive for dyspnea on exertion.  Dermatological: No rash, lesions/masses Respiratory: No cough, dyspnea Urologic: No hematuria, dysuria Abdominal:   No nausea, vomiting, diarrhea, bright red blood per rectum, melena, or hematemesis Neurologic:  No visual changes, wkns, changes in mental status. All other systems reviewed and are otherwise negative except as noted above.  Physical Exam/Data    Vitals:   04/04/19 0642 04/04/19 0702 04/04/19 0800 04/04/19 0830  BP: (!) 155/98  (!) 151/110 (!) 138/114  Pulse: (!) 120 (!) 128    Resp: (!) 22 19 (!) 27 (!) 28  Temp: 98.2 F (36.8 C)     TempSrc: Oral     SpO2: 95% 96% 96% 93%  Weight:      Height:       No intake or output data in the 24 hours ending 04/04/19 0947 Filed Weights   04/04/19 0639  Weight: 61.7 kg   Body mass index is 22.98 kg/m.   General: Pleasant elderly female appearing in NAD Psych: Normal affect. Neuro: Alert and oriented X 3. Moves all extremities spontaneously. HEENT: Normal  Neck: Supple without bruits or JVD. Lungs:  Resp regular and unlabored, mild rales along bases bilaterally. Heart: Irregularly irregular. No s3, s4, or murmurs. Abdomen: Soft, non-tender, non-distended, BS + x 4.  Extremities: No clubbing. Trace ankle edema bilaterally. DP/PT/Radials 2+ and equal bilaterally.   EKG:  The EKG was personally reviewed and demonstrates: Atrial fibrillation with RVR, HR 134, with LAFB.   Labs/Studies     Relevant CV Studies:  Echocardiogram: 11/2018 IMPRESSIONS    1. The left ventricle has hyperdynamic systolic function, with an ejection fraction of >65%. The cavity size was normal. Left ventricular diastolic Doppler parameters  are indeterminate.  2. The right ventricle has moderately reduced systolic function.  The cavity was mildly enlarged. There is no increase in right ventricular wall thickness.  3. Left atrial size was severely dilated.  4. Right atrial size was severely dilated.  5. The aortic valve is tricuspid. Mild thickening of the aortic valve. Moderate calcification of the aortic valve. Mild to moderate aortic annular calcification noted.  6. The mitral valve is abnormal. Moderate thickening of the mitral valve leaflet. Moderate calcification of the mitral valve leaflet. There is moderate mitral annular calcification present. Mild mitral valve stenosis.  7. MV is thickened, calcified with very mildly restricted motion.  8. The tricuspid valve is grossly normal. Tricuspid valve regurgitation is moderate.  9. The aortic root is normal in size and structure. 10. The inferior vena cava was dilated in size with <50% respiratory variability. 11. The interatrial septum was not assessed.   Laboratory Data:  Chemistry Recent Labs  Lab 04/04/19 0714  NA 139  K 3.7  CL 105  CO2 24  GLUCOSE 119*  BUN 19  CREATININE 0.72  CALCIUM 9.2  GFRNONAA >60  GFRAA >60  ANIONGAP 10    No results for input(s): PROT, ALBUMIN, AST, ALT, ALKPHOS, BILITOT in the last 168 hours. Hematology Recent Labs  Lab 04/04/19 0714  WBC 5.3  RBC 4.06  HGB 10.8*  HCT 34.9*  MCV 86.0  MCH 26.6  MCHC 30.9  RDW 17.1*  PLT 198   Cardiac EnzymesNo results for input(s): TROPONINI in the last 168 hours. No results for input(s): TROPIPOC in the last 168 hours.  BNP Recent Labs  Lab 04/04/19 0714  BNP 305.0*    DDimer No results for input(s): DDIMER in the last 168 hours.  Radiology/Studies:  Dg Chest Portable 1 View  Result Date: 04/04/2019 CLINICAL DATA:  Shortness of breath EXAM: PORTABLE CHEST 1 VIEW COMPARISON:  Nov 28, 2018 FINDINGS: The cardiac size remains enlarged. Aortic calcifications are noted. There are  small bilateral pleural effusions, right greater than left. There prominent interstitial lung markings, similar to prior study. Bibasilar atelectasis is again noted. IMPRESSION: Stable appearance of the chest with persistent congestive heart failure. Electronically Signed   By: Constance Holster M.D.   On: 04/04/2019 07:59     Assessment & Plan    1. Atrial Fibrillation with RVR - she has known persistent atrial fibrillation and a rate-control strategy has been pursued given her previous asymptomatic state and severe atrial dilation by prior echocardiogram. She denies missing any doses of her Cardizem CD or Eliquis prior to admission. Would continue to investigate for underlying causes of her acute tachycardia. K+ and TSH WNL. Will check Mg. Initial Troponin negative with repeat value pending. Also check D-dimer given leg pain and will require lower extremity dopplers if elevated.  - currently on IV Cardizem 5 mg/hr. If EF remains preserved by echo, would switch to short-acting Cardizem 60mg  Q6H then transition back to Cardizem CD prior to discharge. If EF reduced, will need to be switched to BB therapy.  - she was on Eliquis 2.5mg  BID prior to admission given her age greater than 51 and borderline weight. Weight currently > 60 kg, therefore her correct dosing should be 5mg  BID. Follow weights during admission to determine appropriate dosing at the time of discharge.   2. Acute Diastolic CHF Exacerbation - she reports worsening dyspnea on exertion and is unsure of her current weight. Was 136 lbs last month. BNP 305 and CXR consistent with CHF. She received IV Lasix 20mg  this morning and has urinated  multiple times thus far. Would give an additional 20mg  this afternoon then start 40mg  daily upon admission. Continue to follow I&O's along with daily weights.   - will update her echocardiogram to assess for any changes to her LV function as she is at risk for tachycardia-mediated cardiomyopathy given her  known atrial fibrillation and elevated rates (which was likely occurring for the 1-2 weeks prior to admission).  3. Mitral Stenosis - mild by echo in 11/2018. Repeat imaging pending.   4. Bilateral Leg Pain - occurring mostly with exertion. Given her reduced dose of Eliquis, will check a D-dimer. If elevated, she will need lower extremity dopplers to rule-out a DVT.   For questions or updates, please contact [place] Please consult [email or web address] for contact info under Cardiology/STEMI.  Signed, Erma Heritage, PA-C 04/04/2019, 9:47 AM Pager: [phone]   Attending note:  Patient seen and examined.  I discussed the case with Ms. Delafuente PA-C.  Patient's daughter was also present.  Ms. Brainard is a patient of mine with permanent atrial fibrillation that is been managed with heart rate control strategy and anticoagulation.  She has mild mitral stenosis and a severely dilated left atrium, therefore we have not pursued cardioversion attempts due to likelihood of recurrence.  When last evaluated in the office her heart rate was well controlled on Cardizem CD 240 mg daily.  We have had her on Eliquis 2.5 mg twice daily given borderline and fluctuating weights at age 57.  She presents to the ER reporting worsening shortness of breath and fatigue over the last week.  She states that she has been compliant with her medications and not missed any doses of Cardizem CD.  She has not checked her heart rate or blood pressure recently.  She was found to be in rapid atrial fibrillation on assessment in the ER and has been placed on intravenous diltiazem.  Chest x-ray does show small pleural effusions with interstitial markings.  She states that she has had some right leg swelling as well.  BNP is 305 and d-dimer is 1.03.  High-sensitivity troponin I does not suggest ACS.  On examination she is in no distress.  Afebrile, heart rate 120s in atrial fibrillation, blood pressure elevated.  Lungs exhibit  coarse breath sounds but no crackles, decreased at the bases.  Cardiac exam reveals irregularly irregular rhythm.  She has mild right leg swelling.  Atrial fibrillation with RVR, not entirely clear why heart rate is not as well-controlled as it had been on same AV nodal blocker therapy.  Agree with investigating other causes, with elevated d-dimer would exclude lower extremity DVT, could even consider a chest CTA.  She has been on Eliquis although at 2.5 mg twice daily and if her weight has increased this may be a "subtherapeutic" dose.  We will also get an echocardiogram to ensure that she has not developed a cardiomyopathy.  Further recommendations to follow regarding conversion to oral rate control regimen.  Satira Sark, M.D., F.A.C.C.

## 2019-04-04 NOTE — H&P (Signed)
Patient Demographics:    Kristy Mckinney, is a 85 y.o. female  MRN: [id number]   DOB - [birth year]  Admit Date - 04/04/2019  Outpatient Primary MD for the patient is [name], MD   Assessment & Plan:    Active Problems:   A-fib (HCC)    1) chronic A. fib now in A. fib with RVR--- despite compliance with Cardizem CD 240 daily at home rate control challenging, started on IV Cardizem drip plan to transition back to p.o. Cardizem -Echo with EF of 75%, moderately elevated right-sided heart pressures noted -TSH is 2.5 and potassium is normal -Discussed with Dr. Domenic Polite who is patient's primary cardiologist  2) elevated d-dimer-dyspnea on exertion, leg pains, moderately elevated right-sided heart pressures noted -Lower extremity venous Dopplers pending, PTA patient was on Eliquis 2.5 mg p.o. twice daily, she has gained weight lately so the more appropriate dose for will be Eliquis 5 mg twice daily--  -Probably will not get CTA chest-this is not likely to drastically change management--Eliquis dose already adjusted upward  3)HFpEF--patient with dyspnea on exertion and CHF concerns most likely due to A. fib with RVR, gentle diuresis with IV Lasix -Treat input output and daily weight monitoring -An echo with EF of 7 to 35%  4)NeuroPsych-stable, patient with history of dementia and depression--continue Namenda, Seroquel and Zoloft   With History of - Reviewed by me  Past Medical History:  Diagnosis Date  . Atrial fibrillation ([place])   . Chronic back pain   . Diarrhea   . Dysphagia 05/03/2016  . GERD (gastroesophageal reflux disease)       Past Surgical History:  Procedure Laterality Date  . ABDOMINAL HYSTERECTOMY    . BACK SURGERY    . CHOLECYSTECTOMY N/A 01/29/2014   Procedure: LAPAROSCOPIC  CHOLECYSTECTOMY;  Surgeon: Scherry Ran, MD;  Location: [facility];  Service: General;  Laterality: N/A;  . COLONOSCOPY N/A 10/27/2015   Procedure: COLONOSCOPY;  Surgeon: Rogene Houston, MD;  Location: [facility];  Service: Endoscopy;  Laterality: N/A;  215  . INTRAMEDULLARY (IM) NAIL INTERTROCHANTERIC Right 11/29/2018   Procedure: INTRAMEDULLARY (IM) NAIL INTERTROCHANTRIC FRACTURE;  Surgeon: Renette Butters, MD;  Location: [facility];  Service: Orthopedics;  Laterality: Right;  . TONSILLECTOMY    . YAG LASER APPLICATION Left [id number]   Procedure: YAG LASER APPLICATION;  Surgeon: Elta Guadeloupe T. Gershon Crane, MD;  Location: [facility];  Service: Ophthalmology;  Laterality: Left;  left    Chief Complaint  Patient presents with  . Shortness of Breath      HPI:    Kristy Mckinney  is a 85 y.o. female  with PMH of persistent atrial fibrillation (diagnosed in 11/2018 with rate-control strategy pursued), mitral stenosis, and chronic back pain  who is been compliant with both Eliquis 2.5 mg twice daily and Cardizem CD 240 mg daily presents with palpitations dizziness some dyspnea on exertion and found to be in A. fib with RVR  with heart rate up to the 140s  -In ED she was started on IV Cardizem drip -WBC 5.3, Hgb 10.8, platelets 198, Na+ 139, K+ 3.7, and creatinine 0.72. BNP 305. TSH 2.532. CXR consistent with CHF showing small bilateral pleural effusions. EKG shows atrial fibrillation with RVR, HR 134, with LAFB. No acute ST changes when compared to prior tracings. -Elevated d-dimer noted, venous Dopplers pending  -Complains of leg cramps, no significant leg swelling some leg discomfort with ambulation-no pleuritic symptoms per se  -Patient denies chest pains,, no palpitations, -Patient complains of dyspnea on exertion No fever  Or chills  No Nausea, Vomiting or Diarrhea  --Additional history obtained from patient's daughter at bedside   Review of systems:    In addition to the HPI above,   A full  Review of  Systems was done, all other systems reviewed are negative except as noted above in HPI , .   Social History:  Reviewed by me    Social History   Tobacco Use  . Smoking status: Never Smoker  . Smokeless tobacco: Never Used  Substance Use Topics  . Alcohol use: No     Family History :  Reviewed by me    Family History  Problem Relation Age of Onset  . Hypertension Child      Home Medications:   Prior to Admission medications   Medication Sig Start Date End Date Taking? Authorizing Provider  alendronate (FOSAMAX) 70 MG tablet Take 1 tablet (70 mg total) by mouth once a week. Take with a full glass of water on an empty stomach. 12/23/18  Yes Gerlene Fee, NP  apixaban (ELIQUIS) 2.5 MG TABS tablet Take 1 tablet (2.5 mg total) by mouth 2 (two) times daily. 12/23/18  Yes Gerlene Fee, NP  Balsam [place]-Castor Oil [facility]) OINT Apply topically to sacrum and bilateral buttocks every shift and as needed for prevention 12/06/18  Yes [provider]  diltiazem (CARDIZEM CD) 240 MG 24 hr capsule Take 1 capsule (240 mg total) by mouth daily. 12/23/18  Yes Gerlene Fee, NP  magnesium oxide (MAG-OX) 400 MG tablet Take 1 tablet (400 mg total) by mouth 3 (three) times daily. 12/23/18  Yes Gerlene Fee, NP  memantine (NAMENDA) 5 MG tablet Take 1 tablet (5 mg total) by mouth 2 (two) times daily. 12/23/18  Yes Gerlene Fee, NP  Multiple Vitamin (MULTIVITAMIN WITH MINERALS) TABS tablet Take 1 tablet by mouth daily. 12/05/18  Yes Oretha Milch D, MD  omeprazole (PRILOSEC) 20 MG capsule Take 1 capsule (20 mg total) by mouth every morning. 12/05/18  Yes Desiree Hane, MD  Polyethyl Glycol-Propyl Glycol (SYSTANE OP) Place 1 drop into both eyes daily as needed. Dry Eyes    Yes [provider]  QUEtiapine (SEROQUEL) 25 MG tablet Take 1 tablet (25 mg total) by mouth at bedtime. 12/23/18  Yes Gerlene Fee, NP  sertraline (ZOLOFT) 50 MG tablet Take 1 tablet (50  mg total) by mouth every morning. 12/23/18  Yes Gerlene Fee, NP  acetaminophen (TYLENOL) 325 MG tablet Take 650 mg by mouth every 6 (six) hours as needed. 12/05/18   [provider]  calcium-vitamin D (OS-CAL 500 + D) 500-200 MG-UNIT tablet Take 1 tablet by mouth 2 (two) times daily. Patient not taking: Reported on 02/28/2019 12/05/18   Desiree Hane, MD  diphenoxylate-atropine (LOMOTIL) 2.5-0.025 MG tablet Take 1 tablet by mouth 4 (four) times daily as needed for diarrhea or loose  stools. Patient not taking: Reported on 02/28/2019 12/23/18   Gerlene Fee, NP  docusate sodium (COLACE) 100 MG capsule Take 1 capsule (100 mg total) by mouth 2 (two) times daily. Patient not taking: Reported on 02/28/2019 12/05/18   Oretha Milch D, MD  feeding supplement, ENSURE ENLIVE, (ENSURE ENLIVE) LIQD Take 237 mLs by mouth 2 (two) times daily between meals. Patient not taking: Reported on 02/28/2019 12/05/18   Desiree Hane, MD  NON FORMULARY Diet Type: NAS 12/05/18   [provider]  Ostomy Supplies (SKIN PREP WIPES) MISC Apply to bilateral heels every shift for prevention 12/06/18   [provider]  polyethylene glycol (MIRALAX / GLYCOLAX) 17 g packet Take 17 g by mouth daily as needed for mild constipation. Patient not taking: Reported on 02/28/2019 12/05/18   Desiree Hane, MD     Allergies:     Allergies  Allergen Reactions  . Penicillins Anaphylaxis  . Emetrol Nausea Only    swelling  . Feldene [Piroxicam] Nausea And Vomiting  . Sulfa Antibiotics     swelling     Physical Exam:   Vitals  Blood pressure 127/78, pulse (!) 104, temperature 98.2 F (36.8 C), temperature source Oral, resp. rate (!) 21, height 5' 4.5" (1.638 m), weight 61.7 kg, SpO2 94 %.  Physical Examination: General appearance - alert, well appearing, and in no distress  Mental status - alert, oriented to person, place, and time,  Eyes - sclera anicteric Neck - supple, no JVD elevation ,  Chest -diminished breath sounds with faint bibasilar rales,  Heart - S1 and S2 normal, irregularly irregular with heart rate up to 140s Abdomen - soft, nontender, nondistended, no masses or organomegaly Neurological - screening mental status exam normal, neck supple without rigidity, cranial nerves II through XII intact, DTR's normal and symmetric Extremities - no pedal edema noted, intact peripheral pulses  -Skin - warm, dry   Data Review:    CBC Recent Labs  Lab 04/04/19 0714  WBC 5.3  HGB 10.8*  HCT 34.9*  PLT 198  MCV 86.0  MCH 26.6  MCHC 30.9  RDW 17.1*   ------------------------------------------------------------------------------------------------------------------  Chemistries  Recent Labs  Lab 04/04/19 0714  NA 139  K 3.7  CL 105  CO2 24  GLUCOSE 119*  BUN 19  CREATININE 0.72  CALCIUM 9.2   ------------------------------------------------------------------------------------------------------------------ estimated creatinine clearance is 46.2 mL/min (by C-G formula based on SCr of 0.72 mg/dL). ------------------------------------------------------------------------------------------------------------------ Recent Labs    04/04/19 0714  TSH 2.532     Coagulation profile No results for input(s): INR, PROTIME in the last 168 hours. ------------------------------------------------------------------------------------------------------------------- Recent Labs    04/04/19 0937  DDIMER 1.03*   -------------------------------------------------------------------------------------------------------------------  Cardiac Enzymes No results for input(s): CKMB, TROPONINI, MYOGLOBIN in the last 168 hours.  Invalid input(s): CK ------------------------------------------------------------------------------------------------------------------    Component Value Date/Time   BNP 305.0 (H) 04/04/2019 0714      ---------------------------------------------------------------------------------------------------------------  Urinalysis No results found for: COLORURINE, APPEARANCEUR, LABSPEC, PHURINE, GLUCOSEU, HGBUR, BILIRUBINUR, KETONESUR, PROTEINUR, UROBILINOGEN, NITRITE, LEUKOCYTESUR  ----------------------------------------------------------------------------------------------------------------   Imaging Results:    Dg Chest Portable 1 View  Result Date: 04/04/2019 CLINICAL DATA:  Shortness of breath EXAM: PORTABLE CHEST 1 VIEW COMPARISON:  Nov 28, 2018 FINDINGS: The cardiac size remains enlarged. Aortic calcifications are noted. There are small bilateral pleural effusions, right greater than left. There prominent interstitial lung markings, similar to prior study. Bibasilar atelectasis is again noted. IMPRESSION: Stable appearance of the chest with persistent congestive heart failure. Electronically  Signed   By: Constance Holster M.D.   On: 04/04/2019 07:59    Radiological Exams on Admission: Dg Chest Portable 1 View  Result Date: 04/04/2019 CLINICAL DATA:  Shortness of breath EXAM: PORTABLE CHEST 1 VIEW COMPARISON:  Nov 28, 2018 FINDINGS: The cardiac size remains enlarged. Aortic calcifications are noted. There are small bilateral pleural effusions, right greater than left. There prominent interstitial lung markings, similar to prior study. Bibasilar atelectasis is again noted. IMPRESSION: Stable appearance of the chest with persistent congestive heart failure. Electronically Signed   By: Constance Holster M.D.   On: 04/04/2019 07:59    DVT Prophylaxis -SCD/eliquis AM Labs Ordered, also please review Full Orders  Family Communication: Admission, patients condition and plan of care including tests being ordered have been discussed with the patient and daughter at bedside who indicate understanding and agree with the plan   Code Status - Full Code  Likely DC to  home  Condition   stable   Roxan Hockey M.D on 04/04/2019 at 6:05 PM Go to [email or web address] -  for contact info  [facility] - Office  [phone]

## 2019-04-04 NOTE — Care Management Obs Status (Signed)
[facility] NOTIFICATION   Patient Details  Name: Kristy Mckinney MRN: [id number] Date of Birth: [birth year]   Medicare Observation Status Notification Given:  Yes    Trish Mage, LCSW 04/04/2019, 10:16 AM

## 2019-04-04 NOTE — Progress Notes (Signed)
*  PRELIMINARY RESULTS* Echocardiogram 2D Echocardiogram has been performed.  Leavy Cella 04/04/2019, 2:58 PM

## 2019-04-05 LAB — CBC
HCT: 37.7 % (ref 36.0–46.0)
Hemoglobin: 11.6 g/dL — ABNORMAL LOW (ref 12.0–15.0)
MCH: 26.2 pg (ref 26.0–34.0)
MCHC: 30.8 g/dL (ref 30.0–36.0)
MCV: 85.1 fL (ref 80.0–100.0)
Platelets: 256 10*3/uL (ref 150–400)
RBC: 4.43 MIL/uL (ref 3.87–5.11)
RDW: 16.8 % — ABNORMAL HIGH (ref 11.5–15.5)
WBC: 6 10*3/uL (ref 4.0–10.5)
nRBC: 0 % (ref 0.0–0.2)

## 2019-04-05 LAB — BASIC METABOLIC PANEL
Anion gap: 10 (ref 5–15)
BUN: 20 mg/dL (ref 8–23)
CO2: 30 mmol/L (ref 22–32)
Calcium: 9.4 mg/dL (ref 8.9–10.3)
Chloride: 100 mmol/L (ref 98–111)
Creatinine, Ser: 0.85 mg/dL (ref 0.44–1.00)
GFR calc Af Amer: >60 mL/min (ref >60)
GFR calc non Af Amer: >60 mL/min (ref >60)
Glucose, Bld: 101 mg/dL — ABNORMAL HIGH (ref 70–99)
Potassium: 3.5 mmol/L (ref 3.5–5.1)
Sodium: 140 mmol/L (ref 135–145)

## 2019-04-05 LAB — MAGNESIUM: Magnesium: 1.7 mg/dL (ref 1.7–2.4)

## 2019-04-05 MED ORDER — DILTIAZEM HCL ER COATED BEADS 240 MG PO CP24
240.0000 mg | ORAL_CAPSULE | Freq: Every day | ORAL | Status: DC
  Administered 2019-04-05: 240 mg via ORAL
  Filled 2019-04-05 (×2): qty 1

## 2019-04-05 MED ORDER — FUROSEMIDE 10 MG/ML IJ SOLN
20.0000 mg | Freq: Once | INTRAMUSCULAR | Status: AC
  Administered 2019-04-05: 20 mg via INTRAVENOUS
  Filled 2019-04-05: qty 2

## 2019-04-05 NOTE — Progress Notes (Signed)
   Patient Heart rate at was 106 at rest,oxygen at rest room air was 99.  Patient Heart rate while ambulating was 120 ,no c/o pain or discomfort note,oxygen saturation was 98.Kristy Mckinney

## 2019-04-05 NOTE — Progress Notes (Signed)
Subjective: She says she feels better.  No new complaints.  No chest pain.  Heart rate is in the 90s but she was as high as 140 at about 10:00 last night.  Objective: Vital signs in last 24 hours: Temp:  [98.7 F (37.1 C)-98.9 F (37.2 C)] 98.9 F (37.2 C) (09/26 0520) Pulse Rate:  [98-104] 98 (09/26 0520) Resp:  [14-29] 16 (09/25 2054) BP: (120-142)/(52-99) 130/65 (09/26 0520) SpO2:  [90 %-100 %] 95 % (09/26 0520) Weight:  [62.2 kg] 62.2 kg (09/25 2054) Weight change: 0.544 kg Last BM Date: 04/03/19  Intake/Output from previous day: 09/25 0701 - 09/26 0700 In: 53.6 [I.V.:53.6] Out: 1000 [Urine:1000]  PHYSICAL EXAM General appearance: alert, cooperative and no distress Resp: clear to auscultation bilaterally Cardio: irregularly irregular rhythm GI: soft, non-tender; bowel sounds normal; no masses,  no organomegaly Extremities: extremities normal, atraumatic, no cyanosis or edema  Lab Results:  Results for orders placed or performed during the hospital encounter of 04/04/19 (from the past 48 hour(s))  Basic metabolic panel     Status: Abnormal   Collection Time: 04/04/19  7:14 AM  Result Value Ref Range   Sodium 139 135 - 145 mmol/L   Potassium 3.7 3.5 - 5.1 mmol/L   Chloride 105 98 - 111 mmol/L   CO2 24 22 - 32 mmol/L   Glucose, Bld 119 (H) 70 - 99 mg/dL   BUN 19 8 - 23 mg/dL   Creatinine, Ser 0.72 0.44 - 1.00 mg/dL   Calcium 9.2 8.9 - 10.3 mg/dL   GFR calc non Af Amer >60 >60 mL/min   GFR calc Af Amer >60 >60 mL/min   Anion gap 10 5 - 15    Comment: Performed at [facility], [address], [place], [place] [address]  Troponin I (High Sensitivity)     Status: None   Collection Time: 04/04/19  7:14 AM  Result Value Ref Range   Troponin I (High Sensitivity) 8 <18 ng/L    Comment: (NOTE) Elevated high sensitivity troponin I (hsTnI) values and significant  changes across serial measurements may suggest ACS but many other  chronic and acute conditions are known to  elevate hsTnI results.  Refer to the "Links" section for chest pain algorithms and additional  guidance. Performed at [facility], [address] [place], [place] [address]   CBC     Status: Abnormal   Collection Time: 04/04/19  7:14 AM  Result Value Ref Range   WBC 5.3 4.0 - 10.5 K/uL   RBC 4.06 3.87 - 5.11 MIL/uL   Hemoglobin 10.8 (L) 12.0 - 15.0 g/dL   HCT 34.9 (L) 36.0 - 46.0 %   MCV 86.0 80.0 - 100.0 fL   MCH 26.6 26.0 - 34.0 pg   MCHC 30.9 30.0 - 36.0 g/dL   RDW 17.1 (H) 11.5 - 15.5 %   Platelets 198 150 - 400 K/uL   nRBC 0.0 0.0 - 0.2 %    Comment: Performed at [facility], [address] [place], [place] [address]  Brain natriuretic peptide (order if patient c/o SOB ONLY)     Status: Abnormal   Collection Time: 04/04/19  7:14 AM  Result Value Ref Range   B Natriuretic Peptide 305.0 (H) 0.0 - 100.0 pg/mL    Comment: Performed at [facility], [address], [place], [place] [address]  TSH     Status: None   Collection Time: 04/04/19  7:14 AM  Result Value Ref Range   TSH  2.532 0.350 - 4.500 uIU/mL    Comment: Performed by a 3rd Generation assay with a functional sensitivity of <=0.01 uIU/mL. Performed at [facility], [address], [place], [place] [address]   CBG monitoring, ED     Status: Abnormal   Collection Time: 04/04/19  7:28 AM  Result Value Ref Range   Glucose-Capillary 102 (H) 70 - 99 mg/dL  Troponin I (High Sensitivity)     Status: None   Collection Time: 04/04/19  9:37 AM  Result Value Ref Range   Troponin I (High Sensitivity) 8 <18 ng/L    Comment: (NOTE) Elevated high sensitivity troponin I (hsTnI) values and significant  changes across serial measurements may suggest ACS but many other  chronic and acute conditions are known to elevate hsTnI results.  Refer to the "Links" section for chest pain algorithms and additional  guidance. Performed at [facility], [address], [place], [place] [address]   D-dimer, quantitative (not at [facility])      Status: Abnormal   Collection Time: 04/04/19  9:37 AM  Result Value Ref Range   D-Dimer, Quant 1.03 (H) 0.00 - 0.50 ug/mL-FEU    Comment: (NOTE) At the manufacturer cut-off of 0.50 ug/mL FEU, this assay has been documented to exclude PE with a sensitivity and negative predictive value of 97 to 99%.  At this time, this assay has not been approved by the FDA to exclude DVT/VTE. Results should be correlated with clinical presentation. Performed at [facility], [address], [place], [place] [address]   SARS CORONAVIRUS 2 (TAT 6-24 HRS) Nasopharyngeal Nasopharyngeal Swab     Status: None   Collection Time: 04/04/19 11:12 AM   Specimen: Nasopharyngeal Swab  Result Value Ref Range   SARS Coronavirus 2 NEGATIVE NEGATIVE    Comment: (NOTE) SARS-CoV-2 target nucleic acids are NOT DETECTED. The SARS-CoV-2 RNA is generally detectable in upper and lower respiratory specimens during the acute phase of infection. Negative results do not preclude SARS-CoV-2 infection, do not rule out co-infections with other pathogens, and should not be used as the sole basis for treatment or other patient management decisions. Negative results must be combined with clinical observations, patient history, and epidemiological information. The expected result is Negative. Fact Sheet for Patients: SugarRoll.be Fact Sheet for Healthcare Providers: https://www.woods-mathews.com/ This test is not yet approved or cleared by the [place] FDA and  has been authorized for detection and/or diagnosis of SARS-CoV-2 by FDA under an Emergency Use Authorization (EUA). This EUA will remain  in effect (meaning this test can be used) for the duration of the COVID-19 declaration under Section 56 4(b)(1) of the Act, 21 U.S.C. section 360bbb-3(b)(1), unless the authorization is terminated or revoked sooner. Performed at [facility], [place] [address], [place], [place] [address]    Magnesium     Status: None   Collection Time: 04/05/19  6:32 AM  Result Value Ref Range   Magnesium 1.7 1.7 - 2.4 mg/dL    Comment: Performed at [facility], [address], [place], [place] [id number]  Basic metabolic panel     Status: Abnormal   Collection Time: 04/05/19  6:32 AM  Result Value Ref Range   Sodium 140 135 - 145 mmol/L   Potassium 3.5 3.5 - 5.1 mmol/L   Chloride 100 98 - 111 mmol/L   CO2 30 22 - 32 mmol/L   Glucose, Bld 101 (H) 70 - 99 mg/dL   BUN 20 8 - 23 mg/dL   Creatinine,  Ser 0.85 0.44 - 1.00 mg/dL   Calcium 9.4 8.9 - 10.3 mg/dL   GFR calc non Af Amer >60 >60 mL/min   GFR calc Af Amer >60 >60 mL/min   Anion gap 10 5 - 15    Comment: Performed at [facility], [address] [place], [place] [address]  CBC     Status: Abnormal   Collection Time: 04/05/19  6:32 AM  Result Value Ref Range   WBC 6.0 4.0 - 10.5 K/uL   RBC 4.43 3.87 - 5.11 MIL/uL   Hemoglobin 11.6 (L) 12.0 - 15.0 g/dL   HCT 37.7 36.0 - 46.0 %   MCV 85.1 80.0 - 100.0 fL   MCH 26.2 26.0 - 34.0 pg   MCHC 30.8 30.0 - 36.0 g/dL   RDW 16.8 (H) 11.5 - 15.5 %   Platelets 256 150 - 400 K/uL   nRBC 0.0 0.0 - 0.2 %    Comment: Performed at [facility], [address], [place], [place] [address]    ABGS No results for input(s): PHART, PO2ART, TCO2, HCO3 in the last 72 hours.  Invalid input(s): PCO2 CULTURES Recent Results (from the past 240 hour(s))  SARS CORONAVIRUS 2 (TAT 6-24 HRS) Nasopharyngeal Nasopharyngeal Swab     Status: None   Collection Time: 04/04/19 11:12 AM   Specimen: Nasopharyngeal Swab  Result Value Ref Range Status   SARS Coronavirus 2 NEGATIVE NEGATIVE Final    Comment: (NOTE) SARS-CoV-2 target nucleic acids are NOT DETECTED. The SARS-CoV-2 RNA is generally detectable in upper and lower respiratory specimens during the acute phase of infection. Negative results do not preclude SARS-CoV-2 infection, do not rule out co-infections with other pathogens, and should not be used as  the sole basis for treatment or other patient management decisions. Negative results must be combined with clinical observations, patient history, and epidemiological information. The expected result is Negative. Fact Sheet for Patients: SugarRoll.be Fact Sheet for Healthcare Providers: https://www.woods-mathews.com/ This test is not yet approved or cleared by the [place] FDA and  has been authorized for detection and/or diagnosis of SARS-CoV-2 by FDA under an Emergency Use Authorization (EUA). This EUA will remain  in effect (meaning this test can be used) for the duration of the COVID-19 declaration under Section 56 4(b)(1) of the Act, 21 U.S.C. section 360bbb-3(b)(1), unless the authorization is terminated or revoked sooner. Performed at [facility], [place] [address] [place], [place] [address]    Studies/Results: US Venous Img Lower Bilateral  Result Date: 04/04/2019 CLINICAL DATA:  Lower extremity pain. EXAM: BILATERAL LOWER EXTREMITY VENOUS DOPPLER ULTRASOUND TECHNIQUE: Gray-scale sonography with graded compression, as well as color Doppler and duplex ultrasound were performed to evaluate the lower extremity deep venous systems from the level of the common femoral vein and including the common femoral, femoral, profunda femoral, popliteal and calf veins including the posterior tibial, peroneal and gastrocnemius veins when visible. The superficial great saphenous vein was also interrogated. Spectral Doppler was utilized to evaluate flow at rest and with distal augmentation maneuvers in the common femoral, femoral and popliteal veins. COMPARISON:  None. FINDINGS: RIGHT LOWER EXTREMITY Common Femoral Vein: No evidence of thrombus. Normal compressibility, respiratory phasicity and response to augmentation. Saphenofemoral Junction: No evidence of thrombus. Normal compressibility and flow on color Doppler imaging. Profunda Femoral Vein: No  evidence of thrombus. Normal compressibility and flow on color Doppler imaging. Femoral Vein: No evidence of thrombus. Normal compressibility, respiratory phasicity and response to augmentation. Popliteal Vein: No evidence  of thrombus. Normal compressibility, respiratory phasicity and response to augmentation. Calf Veins: No evidence of thrombus. Normal compressibility and flow on color Doppler imaging. Superficial Great Saphenous Vein: No evidence of thrombus. Normal compressibility. Venous Reflux:  None. Other Findings: No evidence of superficial thrombophlebitis or abnormal fluid collection. LEFT LOWER EXTREMITY Common Femoral Vein: No evidence of thrombus. Normal compressibility, respiratory phasicity and response to augmentation. Saphenofemoral Junction: No evidence of thrombus. Normal compressibility and flow on color Doppler imaging. Profunda Femoral Vein: No evidence of thrombus. Normal compressibility and flow on color Doppler imaging. Femoral Vein: No evidence of thrombus. Normal compressibility, respiratory phasicity and response to augmentation. Popliteal Vein: No evidence of thrombus. Normal compressibility, respiratory phasicity and response to augmentation. Calf Veins: No evidence of thrombus. Normal compressibility and flow on color Doppler imaging. Superficial Great Saphenous Vein: No evidence of thrombus. Normal compressibility. Venous Reflux:  None. Other Findings: No evidence of superficial thrombophlebitis or abnormal fluid collection. IMPRESSION: No evidence of deep venous thrombosis in either lower extremity. Electronically Signed   By: Aletta Edouard M.D.   On: 04/04/2019 18:28   Dg Chest Portable 1 View  Result Date: 04/04/2019 CLINICAL DATA:  Shortness of breath EXAM: PORTABLE CHEST 1 VIEW COMPARISON:  Nov 28, 2018 FINDINGS: The cardiac size remains enlarged. Aortic calcifications are noted. There are small bilateral pleural effusions, right greater than left. There prominent  interstitial lung markings, similar to prior study. Bibasilar atelectasis is again noted. IMPRESSION: Stable appearance of the chest with persistent congestive heart failure. Electronically Signed   By: Constance Holster M.D.   On: 04/04/2019 07:59    Medications:  Prior to Admission:  Medications Prior to Admission  Medication Sig Dispense Refill Last Dose  . alendronate (FOSAMAX) 70 MG tablet Take 1 tablet (70 mg total) by mouth once a week. Take with a full glass of water on an empty stomach. 4 tablet 0 Past Week at Unknown time  . apixaban (ELIQUIS) 2.5 MG TABS tablet Take 1 tablet (2.5 mg total) by mouth 2 (two) times daily. 60 tablet 0 04/04/2019 at 0900  . Balsam [place]-Castor Oil (VENELEX) OINT Apply topically to sacrum and bilateral buttocks every shift and as needed for prevention   04/04/2019 at Unknown time  . diltiazem (CARDIZEM CD) 240 MG 24 hr capsule Take 1 capsule (240 mg total) by mouth daily. 30 capsule 0 04/04/2019 at Unknown time  . magnesium oxide (MAG-OX) 400 MG tablet Take 1 tablet (400 mg total) by mouth 3 (three) times daily. 90 tablet 0 04/04/2019 at Unknown time  . memantine (NAMENDA) 5 MG tablet Take 1 tablet (5 mg total) by mouth 2 (two) times daily. 60 tablet 0 04/04/2019 at Unknown time  . Multiple Vitamin (MULTIVITAMIN WITH MINERALS) TABS tablet Take 1 tablet by mouth daily.   04/04/2019 at Unknown time  . omeprazole (PRILOSEC) 20 MG capsule Take 1 capsule (20 mg total) by mouth every morning.   04/04/2019 at Unknown time  . Polyethyl Glycol-Propyl Glycol (SYSTANE OP) Place 1 drop into both eyes daily as needed. Dry Eyes    04/04/2019 at Unknown time  . QUEtiapine (SEROQUEL) 25 MG tablet Take 1 tablet (25 mg total) by mouth at bedtime. 30 tablet 0 04/03/2019 at Unknown time  . sertraline (ZOLOFT) 50 MG tablet Take 1 tablet (50 mg total) by mouth every morning. 30 tablet 0 04/04/2019 at Unknown time  . acetaminophen (TYLENOL) 325 MG tablet Take 650 mg by mouth every 6 (six)  hours as needed.     Marland Kitchen  calcium-vitamin D (OS-CAL 500 + D) 500-200 MG-UNIT tablet Take 1 tablet by mouth 2 (two) times daily. (Patient not taking: Reported on 02/28/2019)     . diphenoxylate-atropine (LOMOTIL) 2.5-0.025 MG tablet Take 1 tablet by mouth 4 (four) times daily as needed for diarrhea or loose stools. (Patient not taking: Reported on 02/28/2019) 10 tablet 0   . docusate sodium (COLACE) 100 MG capsule Take 1 capsule (100 mg total) by mouth 2 (two) times daily. (Patient not taking: Reported on 02/28/2019) 10 capsule 0   . feeding supplement, ENSURE ENLIVE, (ENSURE ENLIVE) LIQD Take 237 mLs by mouth 2 (two) times daily between meals. (Patient not taking: Reported on 02/28/2019) 237 mL 12   . NON FORMULARY Diet Type: NAS     . Ostomy Supplies (SKIN PREP WIPES) MISC Apply to bilateral heels every shift for prevention     . polyethylene glycol (MIRALAX / GLYCOLAX) 17 g packet Take 17 g by mouth daily as needed for mild constipation. (Patient not taking: Reported on 02/28/2019) 14 each 0    Scheduled: . apixaban  5 mg Oral BID  . calcium-vitamin D  1 tablet Oral BID  . diltiazem  60 mg Oral Q6H  . feeding supplement (ENSURE ENLIVE)  237 mL Oral BID BM  . furosemide  20 mg Intravenous Once  . magnesium oxide  400 mg Oral TID  . memantine  5 mg Oral BID  . multivitamin with minerals  1 tablet Oral Daily  . pantoprazole  40 mg Oral Daily  . QUEtiapine  25 mg Oral QHS  . sertraline  50 mg Oral q morning - 10a  . sodium chloride flush  3 mL Intravenous Q12H   Continuous: . sodium chloride     [id number] chloride, acetaminophen **OR** acetaminophen, albuterol, metoprolol tartrate, ondansetron **OR** ondansetron (ZOFRAN) IV, polyethylene glycol, sodium chloride flush, traZODone  Assesment: She was admitted with atrial fib with rapid ventricular response.  She had been on Cardizem CD 240 and is now on immediate release Cardizem.  Her heart rate is better controlled.  She may need an increased dose  stronger to try to have her get up and see what she does.  She has no chest pain.  She had what looks like some volume overload on chest x-ray and she will receive another dose of Lasix today  She has dementia and she is on medication  She has had multiple falls with fractures and has osteoporosis on treatment  She has had trouble with depression and that is better Active Problems:   A-fib ([place])    Plan: Continue current dose of Cardizem.  Try to get her up.  If her heart rate is controlled on current medications I will switch her back to long-acting Cardizem.    LOS: 0 days   Alonza Bogus 04/05/2019, 9:22 AM

## 2019-04-06 DIAGNOSIS — Z20828 Contact with and (suspected) exposure to other viral communicable diseases: Secondary | ICD-10-CM | POA: Diagnosis present

## 2019-04-06 DIAGNOSIS — Z888 Allergy status to other drugs, medicaments and biological substances status: Secondary | ICD-10-CM | POA: Diagnosis not present

## 2019-04-06 DIAGNOSIS — Z79899 Other long term (current) drug therapy: Secondary | ICD-10-CM | POA: Diagnosis not present

## 2019-04-06 DIAGNOSIS — I4891 Unspecified atrial fibrillation: Secondary | ICD-10-CM | POA: Diagnosis present

## 2019-04-06 DIAGNOSIS — M810 Age-related osteoporosis without current pathological fracture: Secondary | ICD-10-CM | POA: Diagnosis present

## 2019-04-06 DIAGNOSIS — M79604 Pain in right leg: Secondary | ICD-10-CM | POA: Diagnosis present

## 2019-04-06 DIAGNOSIS — Z882 Allergy status to sulfonamides status: Secondary | ICD-10-CM | POA: Diagnosis not present

## 2019-04-06 DIAGNOSIS — G8929 Other chronic pain: Secondary | ICD-10-CM | POA: Diagnosis present

## 2019-04-06 DIAGNOSIS — Z23 Encounter for immunization: Secondary | ICD-10-CM | POA: Diagnosis not present

## 2019-04-06 DIAGNOSIS — M79605 Pain in left leg: Secondary | ICD-10-CM | POA: Diagnosis present

## 2019-04-06 DIAGNOSIS — F419 Anxiety disorder, unspecified: Secondary | ICD-10-CM | POA: Diagnosis present

## 2019-04-06 DIAGNOSIS — Z7901 Long term (current) use of anticoagulants: Secondary | ICD-10-CM | POA: Diagnosis not present

## 2019-04-06 DIAGNOSIS — Z90710 Acquired absence of both cervix and uterus: Secondary | ICD-10-CM | POA: Diagnosis not present

## 2019-04-06 DIAGNOSIS — Z8249 Family history of ischemic heart disease and other diseases of the circulatory system: Secondary | ICD-10-CM | POA: Diagnosis not present

## 2019-04-06 DIAGNOSIS — F0390 Unspecified dementia without behavioral disturbance: Secondary | ICD-10-CM | POA: Diagnosis present

## 2019-04-06 DIAGNOSIS — I4821 Permanent atrial fibrillation: Secondary | ICD-10-CM | POA: Diagnosis present

## 2019-04-06 DIAGNOSIS — K219 Gastro-esophageal reflux disease without esophagitis: Secondary | ICD-10-CM | POA: Diagnosis present

## 2019-04-06 DIAGNOSIS — I5033 Acute on chronic diastolic (congestive) heart failure: Secondary | ICD-10-CM | POA: Diagnosis present

## 2019-04-06 DIAGNOSIS — Z9049 Acquired absence of other specified parts of digestive tract: Secondary | ICD-10-CM | POA: Diagnosis not present

## 2019-04-06 DIAGNOSIS — R296 Repeated falls: Secondary | ICD-10-CM | POA: Diagnosis present

## 2019-04-06 DIAGNOSIS — Z880 Allergy status to penicillin: Secondary | ICD-10-CM | POA: Diagnosis not present

## 2019-04-06 DIAGNOSIS — I083 Combined rheumatic disorders of mitral, aortic and tricuspid valves: Secondary | ICD-10-CM | POA: Diagnosis present

## 2019-04-06 DIAGNOSIS — F329 Major depressive disorder, single episode, unspecified: Secondary | ICD-10-CM | POA: Diagnosis present

## 2019-04-06 MED ORDER — DILTIAZEM HCL ER COATED BEADS 180 MG PO CP24
360.0000 mg | ORAL_CAPSULE | Freq: Every day | ORAL | Status: DC
  Administered 2019-04-06 – 2019-04-08 (×3): 360 mg via ORAL
  Filled 2019-04-06 (×3): qty 2

## 2019-04-06 NOTE — Progress Notes (Signed)
Subjective: She has heart rates documented up in the 130s and 140s.  She is on long-acting diltiazem now but I think her dose is going to need to be increased.  Objective: Vital signs in last 24 hours: Temp:  [98 F (36.7 C)-98.4 F (36.9 C)] 98 F (36.7 C) (09/27 0602) Pulse Rate:  [80-121] 80 (09/27 0602) Resp:  [16-17] 17 (09/27 0602) BP: (104-119)/(51-74) 119/51 (09/27 0602) SpO2:  [96 %-99 %] 97 % (09/27 0602) Weight change:  Last BM Date: 04/03/19  Intake/Output from previous day: 09/26 0701 - 09/27 0700 In: 720 [P.O.:720] Out: 1700 [Urine:1700]  PHYSICAL EXAM General appearance: alert, cooperative and no distress Resp: clear to auscultation bilaterally Cardio: irregularly irregular rhythm GI: soft, non-tender; bowel sounds normal; no masses,  no organomegaly Extremities: extremities normal, atraumatic, no cyanosis or edema  Lab Results:  Results for orders placed or performed during the hospital encounter of 04/04/19 (from the past 48 hour(s))  Troponin I (High Sensitivity)     Status: None   Collection Time: 04/04/19  9:37 AM  Result Value Ref Range   Troponin I (High Sensitivity) 8 <18 ng/L    Comment: (NOTE) Elevated high sensitivity troponin I (hsTnI) values and significant  changes across serial measurements may suggest ACS but many other  chronic and acute conditions are known to elevate hsTnI results.  Refer to the "Links" section for chest pain algorithms and additional  guidance. Performed at [facility], [address], [place], [place] [address]   D-dimer, quantitative (not at [facility])     Status: Abnormal   Collection Time: 04/04/19  9:37 AM  Result Value Ref Range   D-Dimer, Quant 1.03 (H) 0.00 - 0.50 ug/mL-FEU    Comment: (NOTE) At the manufacturer cut-off of 0.50 ug/mL FEU, this assay has been documented to exclude PE with a sensitivity and negative predictive value of 97 to 99%.  At this time, this assay has not been approved by the FDA to  exclude DVT/VTE. Results should be correlated with clinical presentation. Performed at [facility] [address] [place], [place] [address]   SARS CORONAVIRUS 2 (TAT 6-24 HRS) Nasopharyngeal Nasopharyngeal Swab     Status: None   Collection Time: 04/04/19 11:12 AM   Specimen: Nasopharyngeal Swab  Result Value Ref Range   SARS Coronavirus 2 NEGATIVE NEGATIVE    Comment: (NOTE) SARS-CoV-2 target nucleic acids are NOT DETECTED. The SARS-CoV-2 RNA is generally detectable in upper and lower respiratory specimens during the acute phase of infection. Negative results do not preclude SARS-CoV-2 infection, do not rule out co-infections with other pathogens, and should not be used as the sole basis for treatment or other patient management decisions. Negative results must be combined with clinical observations, patient history, and epidemiological information. The expected result is Negative. Fact Sheet for Patients: SugarRoll.be Fact Sheet for Healthcare Providers: https://www.woods-mathews.com/ This test is not yet approved or cleared by the [place] FDA and  has been authorized for detection and/or diagnosis of SARS-CoV-2 by FDA under an Emergency Use Authorization (EUA). This EUA will remain  in effect (meaning this test can be used) for the duration of the COVID-19 declaration under Section 56 4(b)(1) of the Act, 21 U.S.C. section 360bbb-3(b)(1), unless the authorization is terminated or revoked sooner. Performed at [facility], [place] [address], [place], [place] [address]   Magnesium     Status: None   Collection Time: 04/05/19  6:32 AM  Result Value Ref Range   Magnesium  1.7 1.7 - 2.4 mg/dL    Comment: Performed at [facility], [address], [place], [place] [id number]  Basic metabolic panel     Status: Abnormal   Collection Time: 04/05/19  6:32 AM  Result Value Ref Range   Sodium 140 135 - 145 mmol/L   Potassium 3.5 3.5 -  5.1 mmol/L   Chloride 100 98 - 111 mmol/L   CO2 30 22 - 32 mmol/L   Glucose, Bld 101 (H) 70 - 99 mg/dL   BUN 20 8 - 23 mg/dL   Creatinine, Ser 0.85 0.44 - 1.00 mg/dL   Calcium 9.4 8.9 - 10.3 mg/dL   GFR calc non Af Amer >60 >60 mL/min   GFR calc Af Amer >60 >60 mL/min   Anion gap 10 5 - 15    Comment: Performed at [facility], [address] [place], [place] [address]  CBC     Status: Abnormal   Collection Time: 04/05/19  6:32 AM  Result Value Ref Range   WBC 6.0 4.0 - 10.5 K/uL   RBC 4.43 3.87 - 5.11 MIL/uL   Hemoglobin 11.6 (L) 12.0 - 15.0 g/dL   HCT 37.7 36.0 - 46.0 %   MCV 85.1 80.0 - 100.0 fL   MCH 26.2 26.0 - 34.0 pg   MCHC 30.8 30.0 - 36.0 g/dL   RDW 16.8 (H) 11.5 - 15.5 %   Platelets 256 150 - 400 K/uL   nRBC 0.0 0.0 - 0.2 %    Comment: Performed at [facility], [address] [place], [place] [address]    ABGS No results for input(s): PHART, PO2ART, TCO2, HCO3 in the last 72 hours.  Invalid input(s): PCO2 CULTURES Recent Results (from the past 240 hour(s))  SARS CORONAVIRUS 2 (TAT 6-24 HRS) Nasopharyngeal Nasopharyngeal Swab     Status: None   Collection Time: 04/04/19 11:12 AM   Specimen: Nasopharyngeal Swab  Result Value Ref Range Status   SARS Coronavirus 2 NEGATIVE NEGATIVE Final    Comment: (NOTE) SARS-CoV-2 target nucleic acids are NOT DETECTED. The SARS-CoV-2 RNA is generally detectable in upper and lower respiratory specimens during the acute phase of infection. Negative results do not preclude SARS-CoV-2 infection, do not rule out co-infections with other pathogens, and should not be used as the sole basis for treatment or other patient management decisions. Negative results must be combined with clinical observations, patient history, and epidemiological information. The expected result is Negative. Fact Sheet for Patients: SugarRoll.be Fact Sheet for Healthcare  Providers: https://www.woods-mathews.com/ This test is not yet approved or cleared by the [place] FDA and  has been authorized for detection and/or diagnosis of SARS-CoV-2 by FDA under an Emergency Use Authorization (EUA). This EUA will remain  in effect (meaning this test can be used) for the duration of the COVID-19 declaration under Section 56 4(b)(1) of the Act, 21 U.S.C. section 360bbb-3(b)(1), unless the authorization is terminated or revoked sooner. Performed at [facility], [place] [address] [place], [place] [address]    Studies/Results: US Venous Img Lower Bilateral  Result Date: 04/04/2019 CLINICAL DATA:  Lower extremity pain. EXAM: BILATERAL LOWER EXTREMITY VENOUS DOPPLER ULTRASOUND TECHNIQUE: Gray-scale sonography with graded compression, as well as color Doppler and duplex ultrasound were performed to evaluate the lower extremity deep venous systems from the level of the common femoral vein and including the common femoral, femoral, profunda femoral, popliteal and calf veins including the posterior tibial, peroneal and gastrocnemius veins when visible. The superficial great  saphenous vein was also interrogated. Spectral Doppler was utilized to evaluate flow at rest and with distal augmentation maneuvers in the common femoral, femoral and popliteal veins. COMPARISON:  None. FINDINGS: RIGHT LOWER EXTREMITY Common Femoral Vein: No evidence of thrombus. Normal compressibility, respiratory phasicity and response to augmentation. Saphenofemoral Junction: No evidence of thrombus. Normal compressibility and flow on color Doppler imaging. Profunda Femoral Vein: No evidence of thrombus. Normal compressibility and flow on color Doppler imaging. Femoral Vein: No evidence of thrombus. Normal compressibility, respiratory phasicity and response to augmentation. Popliteal Vein: No evidence of thrombus. Normal compressibility, respiratory phasicity and response to augmentation.  Calf Veins: No evidence of thrombus. Normal compressibility and flow on color Doppler imaging. Superficial Great Saphenous Vein: No evidence of thrombus. Normal compressibility. Venous Reflux:  None. Other Findings: No evidence of superficial thrombophlebitis or abnormal fluid collection. LEFT LOWER EXTREMITY Common Femoral Vein: No evidence of thrombus. Normal compressibility, respiratory phasicity and response to augmentation. Saphenofemoral Junction: No evidence of thrombus. Normal compressibility and flow on color Doppler imaging. Profunda Femoral Vein: No evidence of thrombus. Normal compressibility and flow on color Doppler imaging. Femoral Vein: No evidence of thrombus. Normal compressibility, respiratory phasicity and response to augmentation. Popliteal Vein: No evidence of thrombus. Normal compressibility, respiratory phasicity and response to augmentation. Calf Veins: No evidence of thrombus. Normal compressibility and flow on color Doppler imaging. Superficial Great Saphenous Vein: No evidence of thrombus. Normal compressibility. Venous Reflux:  None. Other Findings: No evidence of superficial thrombophlebitis or abnormal fluid collection. IMPRESSION: No evidence of deep venous thrombosis in either lower extremity. Electronically Signed   By: Aletta Edouard M.D.   On: 04/04/2019 18:28    Medications:  Prior to Admission:  Medications Prior to Admission  Medication Sig Dispense Refill Last Dose  . alendronate (FOSAMAX) 70 MG tablet Take 1 tablet (70 mg total) by mouth once a week. Take with a full glass of water on an empty stomach. 4 tablet 0 Past Week at Unknown time  . apixaban (ELIQUIS) 2.5 MG TABS tablet Take 1 tablet (2.5 mg total) by mouth 2 (two) times daily. 60 tablet 0 04/04/2019 at 0900  . Balsam [place]-Castor Oil (VENELEX) OINT Apply topically to sacrum and bilateral buttocks every shift and as needed for prevention   04/04/2019 at Unknown time  . diltiazem (CARDIZEM CD) 240 MG 24 hr  capsule Take 1 capsule (240 mg total) by mouth daily. 30 capsule 0 04/04/2019 at Unknown time  . magnesium oxide (MAG-OX) 400 MG tablet Take 1 tablet (400 mg total) by mouth 3 (three) times daily. 90 tablet 0 04/04/2019 at Unknown time  . memantine (NAMENDA) 5 MG tablet Take 1 tablet (5 mg total) by mouth 2 (two) times daily. 60 tablet 0 04/04/2019 at Unknown time  . Multiple Vitamin (MULTIVITAMIN WITH MINERALS) TABS tablet Take 1 tablet by mouth daily.   04/04/2019 at Unknown time  . omeprazole (PRILOSEC) 20 MG capsule Take 1 capsule (20 mg total) by mouth every morning.   04/04/2019 at Unknown time  . Polyethyl Glycol-Propyl Glycol (SYSTANE OP) Place 1 drop into both eyes daily as needed. Dry Eyes    04/04/2019 at Unknown time  . QUEtiapine (SEROQUEL) 25 MG tablet Take 1 tablet (25 mg total) by mouth at bedtime. 30 tablet 0 04/03/2019 at Unknown time  . sertraline (ZOLOFT) 50 MG tablet Take 1 tablet (50 mg total) by mouth every morning. 30 tablet 0 04/04/2019 at Unknown time  . acetaminophen (TYLENOL) 325 MG tablet Take  650 mg by mouth every 6 (six) hours as needed.     . calcium-vitamin D (OS-CAL 500 + D) 500-200 MG-UNIT tablet Take 1 tablet by mouth 2 (two) times daily. (Patient not taking: Reported on 02/28/2019)     . diphenoxylate-atropine (LOMOTIL) 2.5-0.025 MG tablet Take 1 tablet by mouth 4 (four) times daily as needed for diarrhea or loose stools. (Patient not taking: Reported on 02/28/2019) 10 tablet 0   . docusate sodium (COLACE) 100 MG capsule Take 1 capsule (100 mg total) by mouth 2 (two) times daily. (Patient not taking: Reported on 02/28/2019) 10 capsule 0   . feeding supplement, ENSURE ENLIVE, (ENSURE ENLIVE) LIQD Take 237 mLs by mouth 2 (two) times daily between meals. (Patient not taking: Reported on 02/28/2019) 237 mL 12   . NON FORMULARY Diet Type: NAS     . Ostomy Supplies (SKIN PREP WIPES) MISC Apply to bilateral heels every shift for prevention     . polyethylene glycol (MIRALAX /  GLYCOLAX) 17 g packet Take 17 g by mouth daily as needed for mild constipation. (Patient not taking: Reported on 02/28/2019) 14 each 0    Scheduled: . apixaban  5 mg Oral BID  . calcium-vitamin D  1 tablet Oral BID  . diltiazem  360 mg Oral Daily  . feeding supplement (ENSURE ENLIVE)  237 mL Oral BID BM  . magnesium oxide  400 mg Oral TID  . memantine  5 mg Oral BID  . multivitamin with minerals  1 tablet Oral Daily  . pantoprazole  40 mg Oral Daily  . QUEtiapine  25 mg Oral QHS  . sertraline  50 mg Oral q morning - 10a  . sodium chloride flush  3 mL Intravenous Q12H   Continuous: . sodium chloride     [id number] chloride, acetaminophen **OR** acetaminophen, albuterol, ondansetron **OR** ondansetron (ZOFRAN) IV, polyethylene glycol, sodium chloride flush, traZODone  Assesment: She was admitted with atrial fib with rapid ventricular response.  She is still having rapid ventricular response despite changing her medications.  I am going to increase her sustained-release Cardizem.  I do not think I can send her home yet. Active Problems:   A-fib (HCC)    Plan: Change Cardizem CD to 360.  I think her blood pressure will tolerate this.    LOS: 0 days   Alonza Bogus 04/06/2019, 9:02 AM

## 2019-04-07 MED ORDER — METOPROLOL TARTRATE 25 MG PO TABS
25.0000 mg | ORAL_TABLET | Freq: Two times a day (BID) | ORAL | Status: DC
  Administered 2019-04-07 – 2019-04-08 (×3): 25 mg via ORAL
  Filled 2019-04-07 (×3): qty 1

## 2019-04-07 MED ORDER — APIXABAN 5 MG PO TABS: 5.0000 mg | ORAL_TABLET | Freq: Two times a day (BID) | ORAL | 5 refills | Status: DC

## 2019-04-07 MED ORDER — INFLUENZA VAC A&B SA ADJ QUAD 0.5 ML IM PRSY
0.5000 mL | PREFILLED_SYRINGE | INTRAMUSCULAR | Status: AC
  Administered 2019-04-08: 0.5 mL via INTRAMUSCULAR
  Filled 2019-04-07: qty 0.5

## 2019-04-07 MED ORDER — DILTIAZEM HCL ER COATED BEADS 360 MG PO CP24: 360.0000 mg | ORAL_CAPSULE | Freq: Every day | ORAL | 5 refills | Status: DC

## 2019-04-07 NOTE — Progress Notes (Addendum)
Progress Note  Patient Name: Kristy Mckinney Date of Encounter: 04/07/2019  Primary Cardiologist: Rozann Lesches, MD    Subjective   Hoping to go home  Inpatient Medications    Scheduled Meds: . apixaban  5 mg Oral BID  . calcium-vitamin D  1 tablet Oral BID  . diltiazem  360 mg Oral Daily  . feeding supplement (ENSURE ENLIVE)  237 mL Oral BID BM  . [START ON 04/08/2019] influenza vaccine adjuvanted  0.5 mL Intramuscular Tomorrow-1000  . magnesium oxide  400 mg Oral TID  . memantine  5 mg Oral BID  . multivitamin with minerals  1 tablet Oral Daily  . pantoprazole  40 mg Oral Daily  . QUEtiapine  25 mg Oral QHS  . sertraline  50 mg Oral q morning - 10a  . sodium chloride flush  3 mL Intravenous Q12H   Continuous Infusions: . sodium chloride     PRN Meds: sodium chloride, acetaminophen **OR** acetaminophen, albuterol, ondansetron **OR** ondansetron (ZOFRAN) IV, polyethylene glycol, sodium chloride flush, traZODone   Vital Signs    Vitals:   04/06/19 0602 04/06/19 1344 04/06/19 2107 04/07/19 0541  BP: (!) 119/51 113/74 112/79 116/78  Pulse: 80 85 81 83  Resp: 17 16 16 16   Temp: 98 F (36.7 C)  98.6 F (37 C) 98.3 F (36.8 C)  TempSrc: Oral  Oral Oral  SpO2: 97% 97% 95% 93%  Weight:      Height:        Intake/Output Summary (Last 24 hours) at 04/07/2019 0756 Last data filed at 04/07/2019 0018 Gross per 24 hour  Intake 723 ml  Output 600 ml  Net 123 ml   Last 3 Weights 04/04/2019 04/04/2019 02/28/2019  Weight (lbs) 137 lb 3.2 oz 136 lb 140 lb  Weight (kg) 62.234 kg 61.689 kg 63.504 kg      Telemetry    Afib 80-120/m- Personally Reviewed  ECG       Physical Exam    GEN: No acute distress.   Neck: No JVD Cardiac: irreg irreg 2/6 sys murmur  Respiratory: Clear to auscultation bilaterally. GI: Soft, nontender, non-distended  MS: No edema; No deformity. Neuro:  Nonfocal  Psych: Normal affect   Labs    High Sensitivity Troponin:   Recent Labs   Lab 04/04/19 0714 04/04/19 0937  TROPONINIHS 8 8      Chemistry Recent Labs  Lab 04/04/19 0714 04/05/19 0632  NA 139 140  K 3.7 3.5  CL 105 100  CO2 24 30  GLUCOSE 119* 101*  BUN 19 20  CREATININE 0.72 0.85  CALCIUM 9.2 9.4  GFRNONAA >60 >60  GFRAA >60 >60  ANIONGAP 10 10     Hematology Recent Labs  Lab 04/04/19 0714 04/05/19 0632  WBC 5.3 6.0  RBC 4.06 4.43  HGB 10.8* 11.6*  HCT 34.9* 37.7  MCV 86.0 85.1  MCH 26.6 26.2  MCHC 30.9 30.8  RDW 17.1* 16.8*  PLT 198 256    BNP Recent Labs  Lab 04/04/19 0714  BNP 305.0*     DDimer  Recent Labs  Lab 04/04/19 0937  DDIMER 1.03*     Radiology    No results found.  Cardiac Studies   Echo 04/04/19  1. Left ventricular ejection fraction, by visual estimation, is 70 to 75%. The left ventricle has hyperdynamic function. Decreased left ventricular size. There is moderately increased left ventricular hypertrophy.  2. Left ventricular diastolic Doppler parameters are indeterminate pattern of LV  diastolic filling.  3. Right ventricular volume and pressure overload.  4. Global right ventricle has normal systolic function.The right ventricular size is moderately enlarged. Mildly increased right ventricular wall thickness.  5. Left atrial size was severely dilated.  6. Right atrial size was normal.  7. Presence of pericardial fat pad.  8. Moderate aortic valve annular calcification.  9. Moderate to severe mitral annular calcification. 10. Severe calcification of the mitral valve leaflet(s). Valve appears stenotic, not completely evaluated. Suggest follow-up images with VTI measurements or planimetry if possible. 11. The mitral valve is degenerative. Mild mitral valve regurgitation. 12. The tricuspid valve is grossly normal. Tricuspid valve regurgitation is mild. 13. Aortic valve area, by VTI measures 1.83 cm. 14. Aortic valve mean gradient measures 8.8 mmHg. 15. The aortic valve is tricuspid Aortic valve  regurgitation was not visualized by color flow Doppler. Mild aortic valve stenosis. 16. Moderately elevated pulmonary artery systolic pressure. 17. The tricuspid regurgitant velocity is 3.67 m/s, and with an assumed right atrial pressure of 8 mmHg, the estimated right ventricular systolic pressure is moderately elevated at 61.9 mmHg. 18. The inferior vena cava is normal in size with <50% respiratory variability, suggesting right atrial pressure of 8 mmHg. 19. The pulmonic valve was grossly normal. Pulmonic valve regurgitation is trivial by color flow Doppler.   In comparison to the previous echocardiogram(s): Unable to compare with prior study. FINDINGS  Left Ventricle: Left ventricular ejection fraction, by visual estimation, is 70 to 75%. The left ventricle has hyperdynamic function. There is moderately increased left ventricular hypertrophy. Decreased left ventricular size. The interventricular  septum is flattened in systole and diastole, consistent with right ventricular pressure and volume overload. Spectral Doppler shows Left ventricular diastolic Doppler parameters are indeterminate pattern of LV diastolic filling.   Right Ventricle: The right ventricular size is moderately enlarged. Mildly increased right ventricular wall thickness. Global RV systolic function is has normal systolic function. The tricuspid regurgitant velocity is 3.67 m/s, and with an assumed right  atrial pressure of 8 mmHg, the estimated right ventricular systolic pressure is moderately elevated at 61.9 mmHg.   Left Atrium: Left atrial size was severely dilated.   Right Atrium: Right atrial size was normal in size   Pericardium: There is no evidence of pericardial effusion. Presence of pericardial fat pad.   Mitral Valve: The mitral valve is degenerative in appearance. There is severe calcification of the mitral valve leaflet(s). Moderate to severe mitral annular calcification. Mild mitral valve regurgitation.    Tricuspid Valve: The tricuspid valve is grossly normal. Tricuspid valve regurgitation is mild by color flow Doppler.   Aortic Valve: The aortic valve is tricuspid. Aortic valve regurgitation was not visualized by color flow Doppler. Mild aortic stenosis is present. Moderate aortic valve annular calcification. Aortic valve mean gradient measures 8.8 mmHg. Aortic valve  peak gradient measures 15.2 mmHg. Aortic valve area, by VTI measures 1.83 cm.   Pulmonic Valve: The pulmonic valve was grossly normal. Pulmonic valve regurgitation is trivial by color flow Doppler.   Aorta: The aortic root is normal in size and structure.   Venous: The inferior vena cava is normal in size with less than 50% respiratory variability, suggesting right atrial pressure of 8 mmHg.   IAS/Shunts: No atrial level shunt detected by color flow Doppler.       Patient Profile     85 y.o. female  With history of persistent atrial fibrillation (diagnosed in 11/2018 with rate-control strategy pursued), mitral stenosis, and chronic back pain  who was admitted with of atrial fibrillation with RVR     Assessment & Plan    Atrial fib with RVR-Diltiazem increased to 360 mg daily yest-HR still 110-120/m. Would add low dose beta blocker, metoprolol 25 mg bid. She wants to go home.Eliquis increased to 5 mg BID because weight up 145 lbs-62 kg.  Acute diastolic CHF echo [id number] normal LVEF 70-75%, RV volume and presssure overload-negative 1.8 L since admission-compensated.  Mitral stenosis   [facility] will follow  For questions or updates, please contact [place] Please consult [email or web address] for contact info under        Signed, Ermalinda Barrios, PA-C  04/07/2019, 7:56 AM    Patient seen and discussed with PA Bonnell Public, I agree with her documentation. Admitted with afib with RVR, had been on dilt gtt then converted to oral regimen. Currently on diltiazem 360mg  daily, eliquis for stroke prevention. From notes severe  LAE and have not pursued maintaing SR.   High rates yesterday, dilt increased from 240mg  daily to 360gm daily. Stable bp's, heart rates remain elevated 120s. Start lopressor 25mg  bid, can be further titrated as outpatient.    Managed for acute diastolic HF this admission. Echo this admit LVEF [id number], indet diastolic function, normal RV, severe LAE, limited eval of MV but 11/2018 study mild MS.Going back and measuring the mean gradient it measurs out to 4 mmHg, mild MS. I/Os incomplete this admission. Appears euvolemic currently, likely exacerbated by tachycardia on admission  Start lopressor 25mg  bid, she is asymptomatic with rates in 120s and ok for discharge from cardiac standpoint point, lopressor can be further titrated as outpatient.   Carlyle Dolly MD

## 2019-04-07 NOTE — TOC Initial Note (Signed)
Transition of Care [facility]) - Initial/Assessment Note    Patient Details  Name: Kristy Mckinney MRN: [id number] Date of Birth: October 30, 1931  Transition of Care [facility]) CM/SW Contact:    Kristy Mage, LCSW Phone Number: 04/07/2019, 2:55 PM  Clinical Narrative:  Kristy Mckinney was seen due to high risk for readmission. She lives at home alone, but is checked on daily, in person or on the phone, by her local daughters and son.  She has been doing all her ADL's, as well as taking care of her apartment and driving, though she admits that since d/c from [facility] where she went to rehab for a broken femur, her daughter is bringing her a daily meal when she stops by to check on her.  Furthermore, she has DME of cane, which she uses most frequently, a walker, shower chair and grab rails in her handicap accessible bathroom. Dr Luan Pulling is her PCP and she states all her medications are affordable.  TOC will continue to follow during the course of hospitalization.               Expected Discharge Plan: Home/Self Care Barriers to Discharge: No Barriers Identified   Patient Goals and CMS Choice Patient states their goals for this hospitalization and ongoing recovery are:: "I was hoping to go home today, but the Dr made a medication adjustment."      Expected Discharge Plan and Services Expected Discharge Plan: Home/Self Care   Discharge Planning Services: CM Consult   Living arrangements for the past 2 months: Apartment Expected Discharge Date: 04/07/19                                    Prior Living Arrangements/Services Living arrangements for the past 2 months: Apartment Lives with:: Self Patient language and need for interpreter reviewed:: Yes Do you feel safe going back to the place where you live?: Yes      Need for Family Participation in Patient Care: Yes (Comment) Care giver support system in place?: Yes (comment) Current home services: DME Criminal Activity/Legal Involvement Pertinent  to Current Situation/Hospitalization: No - Comment as needed  Activities of Daily Living Home Assistive Devices/Equipment: Cane (specify quad or straight) ADL Screening (condition at time of admission) Patient's cognitive ability adequate to safely complete daily activities?: Yes Is the patient deaf or have difficulty hearing?: Yes Does the patient have difficulty seeing, even when wearing glasses/contacts?: No Does the patient have difficulty concentrating, remembering, or making decisions?: No Patient able to express need for assistance with ADLs?: Yes Does the patient have difficulty dressing or bathing?: No Independently performs ADLs?: Yes (appropriate for developmental age) Does the patient have difficulty walking or climbing stairs?: No Weakness of Legs: None Weakness of Arms/Hands: None  Permission Sought/Granted                  Emotional Assessment Appearance:: Appears stated age Attitude/Demeanor/Rapport: Engaged Affect (typically observed): Appropriate Orientation: : Oriented to Self, Oriented to Place, Oriented to  Time, Oriented to Situation Alcohol / Substance Use: Not Applicable Psych Involvement: No (comment)  Admission diagnosis:  Atrial fibrillation with RVR ([place]) [I48.91] Pulmonary emboli (HCC) [I26.99] Acute dyspnea [R06.00] Acute congestive heart failure, unspecified heart failure type ([place]) [I50.9] A-fib ([place]) [I48.91] Patient Active Problem List   Diagnosis Date Noted  . Atrial fibrillation ([place]) 04/06/2019  . A-fib ([place]) 04/04/2019  . Dementia ([place]) 12/12/2018  . GERD  without esophagitis 12/06/2018  . Post-menopausal osteoporosis 12/06/2018  . Acute delirium 12/06/2018  . Hypomagnesemia 12/06/2018  . Acute blood loss anemia 12/06/2018  . Closed fracture of right femur, unspecified fracture morphology, initial encounter ([place]) 11/28/2018  . Acute diastolic CHF (congestive heart failure) ([place]) 11/28/2018  . Fracture, intertrochanteric, right  femur ([place]) 11/28/2018  . Atrial fibrillation with rapid ventricular response ([place]) 11/25/2018  . Atrial fibrillation with RVR ([place]) 11/24/2018  . Chronic back pain 11/24/2018  . Anxiety and depression 11/24/2018  . Chronic constipation 05/03/2016  . Dysphagia 05/03/2016  . Cholecystitis with cholelithiasis 01/29/2014   PCP:  [name], MD Pharmacy:   [facility], [place] [id number] [place] [id number] Phone: [phone] Fax: [phone]  [facility] - [address] [place], [place] [address] Phone: [phone] Fax: [phone]     Social Determinants of Health (SDOH) Interventions    Readmission Risk Interventions Readmission Risk Prevention Plan 11/27/2018  Post Dischage Appt Not Complete  Appt Comments cardiologist will call patient for appt.   Medication Screening Complete  Transportation Screening Complete  Some recent data might be hidden

## 2019-04-07 NOTE — ED Provider Notes (Signed)
[facility] Provider Note   CSN: [id number] Arrival date & time: 04/04/19  [id number]     History   Chief Complaint Chief Complaint  Patient presents with  . Shortness of Breath    HPI Kristy Mckinney is a 85 y.o. female.     Patient with hx of a fib, diastolic CHF presents with sob and fatigue.  Pt had mild intermittent chest pain for a few days and recently fatigue/ sob with exertion. Compliant with home meds, cardizem.  No fevers or covid contacts.No current CP     Past Medical History:  Diagnosis Date  . Atrial fibrillation ([place])   . Chronic back pain   . Diarrhea   . Dysphagia 05/03/2016  . GERD (gastroesophageal reflux disease)     Patient Active Problem List   Diagnosis Date Noted  . Atrial fibrillation ([place]) 04/06/2019  . A-fib ([place]) 04/04/2019  . Dementia ([place]) 12/12/2018  . GERD without esophagitis 12/06/2018  . Post-menopausal osteoporosis 12/06/2018  . Acute delirium 12/06/2018  . Hypomagnesemia 12/06/2018  . Acute blood loss anemia 12/06/2018  . Closed fracture of right femur, unspecified fracture morphology, initial encounter ([place]) 11/28/2018  . Acute diastolic CHF (congestive heart failure) ([place]) 11/28/2018  . Fracture, intertrochanteric, right femur ([place]) 11/28/2018  . Atrial fibrillation with rapid ventricular response ([place]) 11/25/2018  . Atrial fibrillation with RVR ([place]) 11/24/2018  . Chronic back pain 11/24/2018  . Anxiety and depression 11/24/2018  . Chronic constipation 05/03/2016  . Dysphagia 05/03/2016  . Cholecystitis with cholelithiasis 01/29/2014    Past Surgical History:  Procedure Laterality Date  . ABDOMINAL HYSTERECTOMY    . BACK SURGERY    . CHOLECYSTECTOMY N/A 01/29/2014   Procedure: LAPAROSCOPIC CHOLECYSTECTOMY;  Surgeon: Scherry Ran, MD;  Location: [facility];  Service: General;  Laterality: N/A;  . COLONOSCOPY N/A 10/27/2015   Procedure: COLONOSCOPY;  Surgeon: Rogene Houston, MD;  Location[facility];  Service: Endoscopy;  Laterality: N/A;  215  . INTRAMEDULLARY (IM) NAIL INTERTROCHANTERIC Right 11/29/2018   Procedure: INTRAMEDULLARY (IM) NAIL INTERTROCHANTRIC FRACTURE;  Surgeon: Renette Butters, MD;  Location: [facility];  Service: Orthopedics;  Laterality: Right;  . TONSILLECTOMY    . YAG LASER APPLICATION Left [id number]   Procedure: YAG LASER APPLICATION;  Surgeon: Elta Guadeloupe T. Gershon Crane, MD;  Location: [facility];  Service: Ophthalmology;  Laterality: Left;  left     OB History   No obstetric history on file.      Home Medications    Prior to Admission medications   Medication Sig Start Date End Date Taking? Authorizing Provider  alendronate (FOSAMAX) 70 MG tablet Take 1 tablet (70 mg total) by mouth once a week. Take with a full glass of water on an empty stomach. 12/23/18  Yes Gerlene Fee, NP  apixaban (ELIQUIS) 2.5 MG TABS tablet Take 1 tablet (2.5 mg total) by mouth 2 (two) times daily. 12/23/18  Yes Gerlene Fee, NP  Balsam [place]-Castor Oil [facility]) OINT Apply topically to sacrum and bilateral buttocks every shift and as needed for prevention 12/06/18  Yes [provider]  diltiazem (CARDIZEM CD) 240 MG 24 hr capsule Take 1 capsule (240 mg total) by mouth daily. 12/23/18  Yes Gerlene Fee, NP  magnesium oxide (MAG-OX) 400 MG tablet Take 1 tablet (400 mg total) by mouth 3 (three) times daily. 12/23/18  Yes Gerlene Fee, NP  memantine (NAMENDA) 5 MG tablet Take 1 tablet (5 mg total) by  mouth 2 (two) times daily. 12/23/18  Yes Gerlene Fee, NP  Multiple Vitamin (MULTIVITAMIN WITH MINERALS) TABS tablet Take 1 tablet by mouth daily. 12/05/18  Yes Oretha Milch D, MD  omeprazole (PRILOSEC) 20 MG capsule Take 1 capsule (20 mg total) by mouth every morning. 12/05/18  Yes Desiree Hane, MD  Polyethyl Glycol-Propyl Glycol (SYSTANE OP) Place 1 drop into both eyes daily as needed. Dry Eyes    Yes [provider]  QUEtiapine (SEROQUEL) 25 MG tablet Take 1 tablet  (25 mg total) by mouth at bedtime. 12/23/18  Yes Gerlene Fee, NP  sertraline (ZOLOFT) 50 MG tablet Take 1 tablet (50 mg total) by mouth every morning. 12/23/18  Yes Gerlene Fee, NP  acetaminophen (TYLENOL) 325 MG tablet Take 650 mg by mouth every 6 (six) hours as needed. 12/05/18   [provider]  apixaban (ELIQUIS) 5 MG TABS tablet Take 1 tablet (5 mg total) by mouth 2 (two) times daily. 04/07/19   [name], MD  calcium-vitamin D (OS-CAL 500 + D) 500-200 MG-UNIT tablet Take 1 tablet by mouth 2 (two) times daily. Patient not taking: Reported on 02/28/2019 12/05/18   Desiree Hane, MD  diltiazem (CARDIZEM CD) 360 MG 24 hr capsule Take 1 capsule (360 mg total) by mouth daily. 04/07/19   [name], MD  diphenoxylate-atropine (LOMOTIL) 2.5-0.025 MG tablet Take 1 tablet by mouth 4 (four) times daily as needed for diarrhea or loose stools. Patient not taking: Reported on 02/28/2019 12/23/18   Gerlene Fee, NP  docusate sodium (COLACE) 100 MG capsule Take 1 capsule (100 mg total) by mouth 2 (two) times daily. Patient not taking: Reported on 02/28/2019 12/05/18   Oretha Milch D, MD  feeding supplement, ENSURE ENLIVE, (ENSURE ENLIVE) LIQD Take 237 mLs by mouth 2 (two) times daily between meals. Patient not taking: Reported on 02/28/2019 12/05/18   Desiree Hane, MD  NON FORMULARY Diet Type: NAS 12/05/18   [provider]  Ostomy Supplies (SKIN PREP WIPES) MISC Apply to bilateral heels every shift for prevention 12/06/18   [provider]  polyethylene glycol (MIRALAX / GLYCOLAX) 17 g packet Take 17 g by mouth daily as needed for mild constipation. Patient not taking: Reported on 02/28/2019 12/05/18   Desiree Hane, MD    Family History Family History  Problem Relation Age of Onset  . Hypertension Child     Social History Social History   Tobacco Use  . Smoking status: Never Smoker  . Smokeless tobacco: Never Used  Substance Use Topics  . Alcohol  use: No  . Drug use: No     Allergies   Penicillins, Emetrol, Feldene [piroxicam], and Sulfa antibiotics   Review of Systems Review of Systems  Constitutional: Positive for fatigue. Negative for chills and fever.  HENT: Negative for congestion and sore throat.   Eyes: Negative for visual disturbance.  Respiratory: Positive for shortness of breath. Negative for cough.   Cardiovascular: Positive for chest pain and leg swelling (bilateral). Negative for palpitations.  Gastrointestinal: Negative for abdominal pain and vomiting.  Genitourinary: Negative for dysuria, flank pain and hematuria.  Musculoskeletal: Negative for arthralgias, back pain, neck pain and neck stiffness.  Skin: Negative for color change and rash.  Neurological: Negative for seizures, syncope, light-headedness and headaches.  All other systems reviewed and are negative.    Physical Exam Updated Vital Signs BP 116/78 (BP Location: Right Arm)   Pulse 83   Temp 98.3 F (  36.8 C) (Oral)   Resp 16   Ht 5\' 4"  (1.626 m)   Wt 62.2 kg   SpO2 94%   BMI 23.55 kg/m   Physical Exam Vitals signs and nursing note reviewed.  Constitutional:      Appearance: She is well-developed.  HENT:     Head: Normocephalic and atraumatic.  Eyes:     General:        Right eye: No discharge.        Left eye: No discharge.     Conjunctiva/sclera: Conjunctivae normal.  Neck:     Musculoskeletal: Normal range of motion and neck supple.     Trachea: No tracheal deviation.  Cardiovascular:     Rate and Rhythm: Tachycardia present. Rhythm irregular.  Pulmonary:     Effort: Pulmonary effort is normal. Tachypnea present.     Breath sounds: Rales (fewlower) present.  Abdominal:     General: There is no distension.     Palpations: Abdomen is soft.     Tenderness: There is no abdominal tenderness. There is no guarding.  Musculoskeletal:     Right lower leg: Edema present.     Left lower leg: Edema present.  Skin:    General: Skin  is warm.     Findings: No rash.  Neurological:     Mental Status: She is alert and oriented to person, place, and time.      ED Treatments / Results  Labs (all labs ordered are listed, but only abnormal results are displayed) Labs Reviewed  BASIC METABOLIC PANEL - Abnormal; Notable for the following components:      Result Value   Glucose, Bld 119 (*)    All other components within normal limits  CBC - Abnormal; Notable for the following components:   Hemoglobin 10.8 (*)    HCT 34.9 (*)    RDW 17.1 (*)    All other components within normal limits  BRAIN NATRIURETIC PEPTIDE - Abnormal; Notable for the following components:   B Natriuretic Peptide 305.0 (*)    All other components within normal limits  D-DIMER, QUANTITATIVE (NOT AT [facility]) - Abnormal; Notable for the following components:   D-Dimer, Quant 1.03 (*)    All other components within normal limits  BASIC METABOLIC PANEL - Abnormal; Notable for the following components:   Glucose, Bld 101 (*)    All other components within normal limits  CBC - Abnormal; Notable for the following components:   Hemoglobin 11.6 (*)    RDW 16.8 (*)    All other components within normal limits  CBG MONITORING, ED - Abnormal; Notable for the following components:   Glucose-Capillary 102 (*)    All other components within normal limits  SARS CORONAVIRUS 2 (TAT 6-24 HRS)  TSH  MAGNESIUM  TROPONIN I (HIGH SENSITIVITY)  TROPONIN I (HIGH SENSITIVITY)    EKG EKG Interpretation  Date/Time:  Friday April 04 2019 06:41:26 EDT Ventricular Rate:  134 PR Interval:    QRS Duration: 68 QT Interval:  306 QTC Calculation: 457 R Axis:   -60 Text Interpretation:  Atrial fibrillation Left anterior fascicular block Anterior infarct, old Nonspecific T abnormalities, lateral leads Confirmed by Orpah Greek [phone]) on 04/06/2019 9:16:31 AM   Radiology No results found.  Procedures .Critical Care Performed by: Kristy Morrison, MD  Authorized by: Kristy Morrison, MD   Critical care provider statement:    Critical care time (minutes):  40   Critical care start time:  04/04/2019 8:00 AM  Critical care end time:  04/04/2019 8:40 AM   Critical care time was exclusive of:  Separately billable procedures and treating other patients and teaching time   Critical care was necessary to treat or prevent imminent or life-threatening deterioration of the following conditions:  Cardiac failure   Critical care was time spent personally by me on the following activities:  Discussions with consultants, evaluation of patient's response to treatment, examination of patient, ordering and performing treatments and interventions, ordering and review of laboratory studies, ordering and review of radiographic studies, pulse oximetry, re-evaluation of patient's condition, obtaining history from patient or surrogate and review of old charts   (including critical care time)  Medications Ordered in ED Medications  apixaban (ELIQUIS) tablet 5 mg (5 mg Oral Given 04/07/19 0818)  multivitamin with minerals tablet 1 tablet (1 tablet Oral Given 04/07/19 0817)  pantoprazole (PROTONIX) EC tablet 40 mg (40 mg Oral Given 04/07/19 0818)  magnesium oxide (MAG-OX) tablet 400 mg (400 mg Oral Given 04/07/19 0817)  memantine (NAMENDA) tablet 5 mg (5 mg Oral Given 04/07/19 0818)  QUEtiapine (SEROQUEL) tablet 25 mg (25 mg Oral Given 04/06/19 2202)  sertraline (ZOLOFT) tablet 50 mg (50 mg Oral Given 04/07/19 0817)  calcium-vitamin D (OSCAL WITH D) 500-200 MG-UNIT per tablet 1 tablet (1 tablet Oral Given 04/07/19 0817)  feeding supplement (ENSURE ENLIVE) (ENSURE ENLIVE) liquid 237 mL (237 mLs Oral Not Given 04/07/19 0818)  sodium chloride flush (NS) 0.9 % injection 3 mL (3 mLs Intravenous Not Given 04/07/19 0818)  sodium chloride flush (NS) 0.9 % injection 3 mL (has no administration in time range)  0.9 %  sodium chloride infusion (has no administration in time range)   acetaminophen (TYLENOL) tablet 650 mg (has no administration in time range)    Or  acetaminophen (TYLENOL) suppository 650 mg (has no administration in time range)  traZODone (DESYREL) tablet 50 mg (has no administration in time range)  polyethylene glycol (MIRALAX / GLYCOLAX) packet 17 g (has no administration in time range)  ondansetron (ZOFRAN) tablet 4 mg (has no administration in time range)    Or  ondansetron (ZOFRAN) injection 4 mg (has no administration in time range)  albuterol (PROVENTIL) (2.5 MG/3ML) 0.083% nebulizer solution 2.5 mg (has no administration in time range)  diltiazem (CARDIZEM CD) 24 hr capsule 360 mg (360 mg Oral Given 04/07/19 0817)  influenza vaccine adjuvanted (FLUAD) injection 0.5 mL (has no administration in time range)  metoprolol tartrate (LOPRESSOR) tablet 25 mg (has no administration in time range)  diltiazem (CARDIZEM) 1 mg/mL load via infusion 15 mg (15 mg Intravenous Bolus from Bag 04/04/19 0838)  furosemide (LASIX) injection 20 mg (20 mg Intravenous Given 04/04/19 0831)  furosemide (LASIX) injection 20 mg (20 mg Intravenous Given 04/04/19 1445)  metoprolol tartrate (LOPRESSOR) injection 5 mg (5 mg Intravenous Given 04/05/19 1627)  furosemide (LASIX) injection 20 mg (20 mg Intravenous Given 04/05/19 1057)     Initial Impression / Assessment and Plan / ED Course  I have reviewed the triage vital signs and the nursing notes.  Pertinent labs & imaging results that were available during my care of the patient were reviewed by me and considered in my medical decision making (see chart for details).       Patient with atrial fib and rvr, compliant with home medications.  Plan for cardizem bolus and drip, titrated to hr to 90s.   Concern for mild pulmonary edema from acute on chronic a fib.  Lasix dose given IV.  Pt improved in ED AND ADMITTED for further treatment.   Kristy Mckinney was evaluated in Emergency Department on 04/07/2019 for the symptoms  described in the history of present illness. She was evaluated in the context of the global COVID-19 pandemic, which necessitated consideration that the patient might be at risk for infection with the SARS-CoV-2 virus that causes COVID-19. Institutional protocols and algorithms that pertain to the evaluation of patients at risk for COVID-19 are in a state of rapid change based on information released by regulatory bodies including the CDC and federal and state organizations. These policies and algorithms were followed during the patient's care in the ED.  The patients results and plan were reviewed and discussed.   Any x-rays performed were independently reviewed by myself.   Differential diagnosis were considered with the presenting HPI.  Medications  apixaban (ELIQUIS) tablet 5 mg (5 mg Oral Given 04/07/19 0818)  multivitamin with minerals tablet 1 tablet (1 tablet Oral Given 04/07/19 0817)  pantoprazole (PROTONIX) EC tablet 40 mg (40 mg Oral Given 04/07/19 0818)  magnesium oxide (MAG-OX) tablet 400 mg (400 mg Oral Given 04/07/19 0817)  memantine (NAMENDA) tablet 5 mg (5 mg Oral Given 04/07/19 0818)  QUEtiapine (SEROQUEL) tablet 25 mg (25 mg Oral Given 04/06/19 2202)  sertraline (ZOLOFT) tablet 50 mg (50 mg Oral Given 04/07/19 0817)  calcium-vitamin D (OSCAL WITH D) 500-200 MG-UNIT per tablet 1 tablet (1 tablet Oral Given 04/07/19 0817)  feeding supplement (ENSURE ENLIVE) (ENSURE ENLIVE) liquid 237 mL (237 mLs Oral Not Given 04/07/19 0818)  sodium chloride flush (NS) 0.9 % injection 3 mL (3 mLs Intravenous Not Given 04/07/19 0818)  sodium chloride flush (NS) 0.9 % injection 3 mL (has no administration in time range)  0.9 %  sodium chloride infusion (has no administration in time range)  acetaminophen (TYLENOL) tablet 650 mg (has no administration in time range)    Or  acetaminophen (TYLENOL) suppository 650 mg (has no administration in time range)  traZODone (DESYREL) tablet 50 mg (has no  administration in time range)  polyethylene glycol (MIRALAX / GLYCOLAX) packet 17 g (has no administration in time range)  ondansetron (ZOFRAN) tablet 4 mg (has no administration in time range)    Or  ondansetron (ZOFRAN) injection 4 mg (has no administration in time range)  albuterol (PROVENTIL) (2.5 MG/3ML) 0.083% nebulizer solution 2.5 mg (has no administration in time range)  diltiazem (CARDIZEM CD) 24 hr capsule 360 mg (360 mg Oral Given 04/07/19 0817)  influenza vaccine adjuvanted (FLUAD) injection 0.5 mL (has no administration in time range)  metoprolol tartrate (LOPRESSOR) tablet 25 mg (has no administration in time range)  diltiazem (CARDIZEM) 1 mg/mL load via infusion 15 mg (15 mg Intravenous Bolus from Bag 04/04/19 0838)  furosemide (LASIX) injection 20 mg (20 mg Intravenous Given 04/04/19 0831)  furosemide (LASIX) injection 20 mg (20 mg Intravenous Given 04/04/19 1445)  metoprolol tartrate (LOPRESSOR) injection 5 mg (5 mg Intravenous Given 04/05/19 1627)  furosemide (LASIX) injection 20 mg (20 mg Intravenous Given 04/05/19 1057)    Vitals:   04/06/19 1344 04/06/19 2107 04/07/19 0541 04/07/19 0700  BP: 113/74 112/79 116/78   Pulse: 85 81 83   Resp: 16 16 16    Temp:  98.6 F (37 C) 98.3 F (36.8 C)   TempSrc:  Oral Oral   SpO2: 97% 95% 93% 94%  Weight:      Height:        Final diagnoses:  Acute dyspnea  Atrial fibrillation  with RVR ([place])  Acute congestive heart failure, unspecified heart failure type [facility])    Admission/ observation were discussed with the admitting physician, patient and/or family and they are comfortable with the plan.    Final Clinical Impressions(s) / ED Diagnoses   Final diagnoses:  Acute dyspnea  Atrial fibrillation with RVR (HCC)  Acute congestive heart failure, unspecified heart failure type [facility])    ED Discharge Orders         Ordered    apixaban (ELIQUIS) 5 MG TABS tablet  2 times daily     04/07/19 0811    diltiazem (CARDIZEM CD) 360  MG 24 hr capsule  Daily     04/07/19 0812           Kristy Morrison, MD 04/07/19 [phone]

## 2019-04-07 NOTE — Progress Notes (Signed)
Subjective: She feels well and wants to go home.  No further episodes of rapid atrial fib documented  Objective: Vital signs in last 24 hours: Temp:  [98.3 F (36.8 C)-98.6 F (37 C)] 98.3 F (36.8 C) (09/28 0541) Pulse Rate:  [81-85] 83 (09/28 0541) Resp:  [16] 16 (09/28 0541) BP: (112-116)/(74-79) 116/78 (09/28 0541) SpO2:  [93 %-97 %] 93 % (09/28 0541) Weight change:  Last BM Date: 03/30/19  Intake/Output from previous day: 09/27 0701 - 09/28 0700 In: 723 [P.O.:720; I.V.:3] Out: 600 [Urine:600]  PHYSICAL EXAM General appearance: alert, cooperative and no distress Resp: clear to auscultation bilaterally Cardio: irregularly irregular rhythm GI: soft, non-tender; bowel sounds normal; no masses,  no organomegaly Extremities: extremities normal, atraumatic, no cyanosis or edema  Lab Results:  No results found for this or any previous visit (from the past 48 hour(s)).  ABGS No results for input(s): PHART, PO2ART, TCO2, HCO3 in the last 72 hours.  Invalid input(s): PCO2 CULTURES Recent Results (from the past 240 hour(s))  SARS CORONAVIRUS 2 (TAT 6-24 HRS) Nasopharyngeal Nasopharyngeal Swab     Status: None   Collection Time: 04/04/19 11:12 AM   Specimen: Nasopharyngeal Swab  Result Value Ref Range Status   SARS Coronavirus 2 NEGATIVE NEGATIVE Final    Comment: (NOTE) SARS-CoV-2 target nucleic acids are NOT DETECTED. The SARS-CoV-2 RNA is generally detectable in upper and lower respiratory specimens during the acute phase of infection. Negative results do not preclude SARS-CoV-2 infection, do not rule out co-infections with other pathogens, and should not be used as the sole basis for treatment or other patient management decisions. Negative results must be combined with clinical observations, patient history, and epidemiological information. The expected result is Negative. Fact Sheet for Patients: SugarRoll.be Fact Sheet for Healthcare  Providers: https://www.woods-mathews.com/ This test is not yet approved or cleared by the [place] FDA and  has been authorized for detection and/or diagnosis of SARS-CoV-2 by FDA under an Emergency Use Authorization (EUA). This EUA will remain  in effect (meaning this test can be used) for the duration of the COVID-19 declaration under Section 56 4(b)(1) of the Act, 21 U.S.C. section 360bbb-3(b)(1), unless the authorization is terminated or revoked sooner. Performed at [facility], [place] [address] [place], [place] [address]    Studies/Results: No results found.  Medications:  Prior to Admission:  Medications Prior to Admission  Medication Sig Dispense Refill Last Dose  . alendronate (FOSAMAX) 70 MG tablet Take 1 tablet (70 mg total) by mouth once a week. Take with a full glass of water on an empty stomach. 4 tablet 0 Past Week at Unknown time  . apixaban (ELIQUIS) 2.5 MG TABS tablet Take 1 tablet (2.5 mg total) by mouth 2 (two) times daily. 60 tablet 0 04/04/2019 at 0900  . Balsam [place]-Castor Oil (VENELEX) OINT Apply topically to sacrum and bilateral buttocks every shift and as needed for prevention   04/04/2019 at Unknown time  . diltiazem (CARDIZEM CD) 240 MG 24 hr capsule Take 1 capsule (240 mg total) by mouth daily. 30 capsule 0 04/04/2019 at Unknown time  . magnesium oxide (MAG-OX) 400 MG tablet Take 1 tablet (400 mg total) by mouth 3 (three) times daily. 90 tablet 0 04/04/2019 at Unknown time  . memantine (NAMENDA) 5 MG tablet Take 1 tablet (5 mg total) by mouth 2 (two) times daily. 60 tablet 0 04/04/2019 at Unknown time  . Multiple Vitamin (MULTIVITAMIN WITH MINERALS) TABS tablet Take 1 tablet by mouth  daily.   04/04/2019 at Unknown time  . omeprazole (PRILOSEC) 20 MG capsule Take 1 capsule (20 mg total) by mouth every morning.   04/04/2019 at Unknown time  . Polyethyl Glycol-Propyl Glycol (SYSTANE OP) Place 1 drop into both eyes daily as needed. Dry Eyes     04/04/2019 at Unknown time  . QUEtiapine (SEROQUEL) 25 MG tablet Take 1 tablet (25 mg total) by mouth at bedtime. 30 tablet 0 04/03/2019 at Unknown time  . sertraline (ZOLOFT) 50 MG tablet Take 1 tablet (50 mg total) by mouth every morning. 30 tablet 0 04/04/2019 at Unknown time  . acetaminophen (TYLENOL) 325 MG tablet Take 650 mg by mouth every 6 (six) hours as needed.     . calcium-vitamin D (OS-CAL 500 + D) 500-200 MG-UNIT tablet Take 1 tablet by mouth 2 (two) times daily. (Patient not taking: Reported on 02/28/2019)     . diphenoxylate-atropine (LOMOTIL) 2.5-0.025 MG tablet Take 1 tablet by mouth 4 (four) times daily as needed for diarrhea or loose stools. (Patient not taking: Reported on 02/28/2019) 10 tablet 0   . docusate sodium (COLACE) 100 MG capsule Take 1 capsule (100 mg total) by mouth 2 (two) times daily. (Patient not taking: Reported on 02/28/2019) 10 capsule 0   . feeding supplement, ENSURE ENLIVE, (ENSURE ENLIVE) LIQD Take 237 mLs by mouth 2 (two) times daily between meals. (Patient not taking: Reported on 02/28/2019) 237 mL 12   . NON FORMULARY Diet Type: NAS     . Ostomy Supplies (SKIN PREP WIPES) MISC Apply to bilateral heels every shift for prevention     . polyethylene glycol (MIRALAX / GLYCOLAX) 17 g packet Take 17 g by mouth daily as needed for mild constipation. (Patient not taking: Reported on 02/28/2019) 14 each 0    Scheduled: . apixaban  5 mg Oral BID  . calcium-vitamin D  1 tablet Oral BID  . diltiazem  360 mg Oral Daily  . feeding supplement (ENSURE ENLIVE)  237 mL Oral BID BM  . [START ON 04/08/2019] influenza vaccine adjuvanted  0.5 mL Intramuscular Tomorrow-1000  . magnesium oxide  400 mg Oral TID  . memantine  5 mg Oral BID  . multivitamin with minerals  1 tablet Oral Daily  . pantoprazole  40 mg Oral Daily  . QUEtiapine  25 mg Oral QHS  . sertraline  50 mg Oral q morning - 10a  . sodium chloride flush  3 mL Intravenous Q12H   Continuous: . sodium chloride      [id number] chloride, acetaminophen **OR** acetaminophen, albuterol, ondansetron **OR** ondansetron (ZOFRAN) IV, polyethylene glycol, sodium chloride flush, traZODone  Assesment: She was admitted with atrial fib with rapid ventricular response.  She has improved.  Her Cardizem has been increased.  Her heart rates are around 80 and her blood pressure is essentially unchanged so I think she is tolerating this okay.  She is ready for discharge Active Problems:   A-fib [facility])   Atrial fibrillation ([place])    Plan: Discharge home today    LOS: 1 day   Alonza Bogus 04/07/2019, 8:12 AM

## 2019-04-08 MED ORDER — METOPROLOL TARTRATE 25 MG PO TABS: 25.0000 mg | ORAL_TABLET | Freq: Two times a day (BID) | ORAL | 5 refills | Status: DC

## 2019-04-08 NOTE — Progress Notes (Signed)
Discharge instructions reviewed with patient. Given AVS. Verbalized understanding of instructions, home medications, prescriptions to be picked up and follow-up appointments. IV site removed, site within normal limits. Flu vaccine administered prior to discharge, right deltoid, tolerated well no complaints. See MAR. Patient left floor in stable condition via w/c accompanied by nurse techs. Discharged home. Donavan Foil, RN

## 2019-04-08 NOTE — Discharge Summary (Signed)
Physician Discharge Summary  Patient ID: Kristy Mckinney MRN: [id number] DOB/AGE: 1932/03/10 85 y.o. Primary Care Physician:Jaycey Gens, Percell Miller, MD Admit date: 04/04/2019 Discharge date: 04/08/2019    Discharge Diagnoses:   Active Problems:   Chronic back pain   Anxiety and depression   Atrial fibrillation with rapid ventricular response (HCC)   GERD without esophagitis   Post-menopausal osteoporosis   Dementia (HCC)   A-fib (HCC)   Atrial fibrillation (HCC) Acute on chronic diastolic heart failure  Allergies as of 04/08/2019      Reactions   Penicillins Anaphylaxis   Emetrol Nausea Only   swelling   Feldene [piroxicam] Nausea And Vomiting   Sulfa Antibiotics    swelling      Medication List    TAKE these medications   acetaminophen 325 MG tablet Commonly known as: TYLENOL Take 650 mg by mouth every 6 (six) hours as needed.   alendronate 70 MG tablet Commonly known as: FOSAMAX Take 1 tablet (70 mg total) by mouth once a week. Take with a full glass of water on an empty stomach.   apixaban 5 MG Tabs tablet Commonly known as: ELIQUIS Take 1 tablet (5 mg total) by mouth 2 (two) times daily. What changed:   medication strength  how much to take   calcium-vitamin D 500-200 MG-UNIT tablet Commonly known as: Os-Cal 500 + D Take 1 tablet by mouth 2 (two) times daily.   diltiazem 360 MG 24 hr capsule Commonly known as: CARDIZEM CD Take 1 capsule (360 mg total) by mouth daily. What changed:   medication strength  how much to take   diphenoxylate-atropine 2.5-0.025 MG tablet Commonly known as: LOMOTIL Take 1 tablet by mouth 4 (four) times daily as needed for diarrhea or loose stools.   docusate sodium 100 MG capsule Commonly known as: COLACE Take 1 capsule (100 mg total) by mouth 2 (two) times daily.   feeding supplement (ENSURE ENLIVE) Liqd Take 237 mLs by mouth 2 (two) times daily between meals.   magnesium oxide 400 MG tablet Commonly known as:  MAG-OX Take 1 tablet (400 mg total) by mouth 3 (three) times daily.   memantine 5 MG tablet Commonly known as: NAMENDA Take 1 tablet (5 mg total) by mouth 2 (two) times daily.   metoprolol tartrate 25 MG tablet Commonly known as: LOPRESSOR Take 1 tablet (25 mg total) by mouth 2 (two) times daily.   multivitamin with minerals Tabs tablet Take 1 tablet by mouth daily.   NON FORMULARY Diet Type: NAS   omeprazole 20 MG capsule Commonly known as: PRILOSEC Take 1 capsule (20 mg total) by mouth every morning.   polyethylene glycol 17 g packet Commonly known as: MIRALAX / GLYCOLAX Take 17 g by mouth daily as needed for mild constipation.   QUEtiapine 25 MG tablet Commonly known as: SEROQUEL Take 1 tablet (25 mg total) by mouth at bedtime.   sertraline 50 MG tablet Commonly known as: ZOLOFT Take 1 tablet (50 mg total) by mouth every morning.   Skin Prep Wipes Misc Apply to bilateral heels every shift for prevention   SYSTANE OP Place 1 drop into both eyes daily as needed. Dry Eyes   Venelex Oint Apply topically to sacrum and bilateral buttocks every shift and as needed for prevention       Discharged Condition: Improved    Consults: Cardiology  Significant Diagnostic Studies: US Venous Img Lower Bilateral  Result Date: 04/04/2019 CLINICAL DATA:  Lower extremity pain. EXAM: BILATERAL LOWER EXTREMITY  VENOUS DOPPLER ULTRASOUND TECHNIQUE: Gray-scale sonography with graded compression, as well as color Doppler and duplex ultrasound were performed to evaluate the lower extremity deep venous systems from the level of the common femoral vein and including the common femoral, femoral, profunda femoral, popliteal and calf veins including the posterior tibial, peroneal and gastrocnemius veins when visible. The superficial great saphenous vein was also interrogated. Spectral Doppler was utilized to evaluate flow at rest and with distal augmentation maneuvers in the common femoral,  femoral and popliteal veins. COMPARISON:  None. FINDINGS: RIGHT LOWER EXTREMITY Common Femoral Vein: No evidence of thrombus. Normal compressibility, respiratory phasicity and response to augmentation. Saphenofemoral Junction: No evidence of thrombus. Normal compressibility and flow on color Doppler imaging. Profunda Femoral Vein: No evidence of thrombus. Normal compressibility and flow on color Doppler imaging. Femoral Vein: No evidence of thrombus. Normal compressibility, respiratory phasicity and response to augmentation. Popliteal Vein: No evidence of thrombus. Normal compressibility, respiratory phasicity and response to augmentation. Calf Veins: No evidence of thrombus. Normal compressibility and flow on color Doppler imaging. Superficial Great Saphenous Vein: No evidence of thrombus. Normal compressibility. Venous Reflux:  None. Other Findings: No evidence of superficial thrombophlebitis or abnormal fluid collection. LEFT LOWER EXTREMITY Common Femoral Vein: No evidence of thrombus. Normal compressibility, respiratory phasicity and response to augmentation. Saphenofemoral Junction: No evidence of thrombus. Normal compressibility and flow on color Doppler imaging. Profunda Femoral Vein: No evidence of thrombus. Normal compressibility and flow on color Doppler imaging. Femoral Vein: No evidence of thrombus. Normal compressibility, respiratory phasicity and response to augmentation. Popliteal Vein: No evidence of thrombus. Normal compressibility, respiratory phasicity and response to augmentation. Calf Veins: No evidence of thrombus. Normal compressibility and flow on color Doppler imaging. Superficial Great Saphenous Vein: No evidence of thrombus. Normal compressibility. Venous Reflux:  None. Other Findings: No evidence of superficial thrombophlebitis or abnormal fluid collection. IMPRESSION: No evidence of deep venous thrombosis in either lower extremity. Electronically Signed   By: Aletta Edouard M.D.   On:  04/04/2019 18:28   Dg Chest Portable 1 View  Result Date: 04/04/2019 CLINICAL DATA:  Shortness of breath EXAM: PORTABLE CHEST 1 VIEW COMPARISON:  Nov 28, 2018 FINDINGS: The cardiac size remains enlarged. Aortic calcifications are noted. There are small bilateral pleural effusions, right greater than left. There prominent interstitial lung markings, similar to prior study. Bibasilar atelectasis is again noted. IMPRESSION: Stable appearance of the chest with persistent congestive heart failure. Electronically Signed   By: Constance Holster M.D.   On: 04/04/2019 07:59    Lab Results: Basic Metabolic Panel: No results for input(s): NA, K, CL, CO2, GLUCOSE, BUN, CREATININE, CALCIUM, MG, PHOS in the last 72 hours. Liver Function Tests: No results for input(s): AST, ALT, ALKPHOS, BILITOT, PROT, ALBUMIN in the last 72 hours.   CBC: No results for input(s): WBC, NEUTROABS, HGB, HCT, MCV, PLT in the last 72 hours.  Recent Results (from the past 240 hour(s))  SARS CORONAVIRUS 2 (TAT 6-24 HRS) Nasopharyngeal Nasopharyngeal Swab     Status: None   Collection Time: 04/04/19 11:12 AM   Specimen: Nasopharyngeal Swab  Result Value Ref Range Status   SARS Coronavirus 2 NEGATIVE NEGATIVE Final    Comment: (NOTE) SARS-CoV-2 target nucleic acids are NOT DETECTED. The SARS-CoV-2 RNA is generally detectable in upper and lower respiratory specimens during the acute phase of infection. Negative results do not preclude SARS-CoV-2 infection, do not rule out co-infections with other pathogens, and should not be used as the sole basis for  treatment or other patient management decisions. Negative results must be combined with clinical observations, patient history, and epidemiological information. The expected result is Negative. Fact Sheet for Patients: SugarRoll.be Fact Sheet for Healthcare Providers: https://www.woods-mathews.com/ This test is not yet approved or  cleared by the [place] FDA and  has been authorized for detection and/or diagnosis of SARS-CoV-2 by FDA under an Emergency Use Authorization (EUA). This EUA will remain  in effect (meaning this test can be used) for the duration of the COVID-19 declaration under Section 56 4(b)(1) of the Act, 21 U.S.C. section 360bbb-3(b)(1), unless the authorization is terminated or revoked sooner. Performed at [facility], [place] [address], [place], [place] [address]      Hospital Course: This is an 85 year old who came to the hospital with atrial fib with rapid ventricular response.  She was evaluated in the emergency department and started on intravenous diltiazem.  Her heart rate was controlled with intermittent bursts of more rapid atrial fib.  She was switched from intravenous diltiazem to oral diltiazem and eventually switched to long-acting form.  Her dose was increased from 240 mg to 360 mg but she still had bursts of more rapid atrial fib.  She was started on metoprolol with better control.  Cardiology consult was obtained.  She also had what appeared to be acute on chronic diastolic heart failure and she diuresed and that was improved.  Discharge Exam: Blood pressure 113/62, pulse 74, temperature 98 F (36.7 C), temperature source Oral, resp. rate 16, height 5\' 4"  (1.626 m), weight 62.2 kg, SpO2 99 %. She is awake and alert.  She is in atrial fib.  Chest is clear.  She does not have any edema.  Disposition: Home    Follow-up Information    [name], MD Follow up on 04/25/2019.   Specialty: Pulmonary Disease Why: Friday at 9:30 Contact information: [address] [place] [address] [phone]        Erma Heritage, PA-C On 04/24/2019.   Specialties: Physician Assistant, Cardiology Why: at 2:30 pm Contact information: [address] [place] [address] [phone]           Signed: Alonza Bogus   04/08/2019, 7:51 AM

## 2019-04-08 NOTE — Progress Notes (Signed)
Subjective: She says she feels okay.  Cardiology help noted and appreciated.  She is on low-dose beta-blocker.  I kept her overnight to be sure that we had better control and it looks good.  Objective: Vital signs in last 24 hours: Temp:  [97.7 F (36.5 C)-98.6 F (37 C)] 98 F (36.7 C) (09/29 0541) Pulse Rate:  [61-110] 74 (09/29 0541) Resp:  [16-19] 16 (09/29 0541) BP: (107-113)/(51-62) 113/62 (09/29 0541) SpO2:  [98 %-100 %] 99 % (09/29 0541) Weight change:  Last BM Date: 04/07/19  Intake/Output from previous day: 09/28 0701 - 09/29 0700 In: 364 [P.O.:360; I.V.:4] Out: -   PHYSICAL EXAM General appearance: alert, cooperative and no distress Resp: clear to auscultation bilaterally Cardio: irregularly irregular rhythm GI: soft, non-tender; bowel sounds normal; no masses,  no organomegaly Extremities: extremities normal, atraumatic, no cyanosis or edema  Lab Results:  No results found for this or any previous visit (from the past 48 hour(s)).  ABGS No results for input(s): PHART, PO2ART, TCO2, HCO3 in the last 72 hours.  Invalid input(s): PCO2 CULTURES Recent Results (from the past 240 hour(s))  SARS CORONAVIRUS 2 (TAT 6-24 HRS) Nasopharyngeal Nasopharyngeal Swab     Status: None   Collection Time: 04/04/19 11:12 AM   Specimen: Nasopharyngeal Swab  Result Value Ref Range Status   SARS Coronavirus 2 NEGATIVE NEGATIVE Final    Comment: (NOTE) SARS-CoV-2 target nucleic acids are NOT DETECTED. The SARS-CoV-2 RNA is generally detectable in upper and lower respiratory specimens during the acute phase of infection. Negative results do not preclude SARS-CoV-2 infection, do not rule out co-infections with other pathogens, and should not be used as the sole basis for treatment or other patient management decisions. Negative results must be combined with clinical observations, patient history, and epidemiological information. The expected result is Negative. Fact Sheet for  Patients: SugarRoll.be Fact Sheet for Healthcare Providers: https://www.woods-mathews.com/ This test is not yet approved or cleared by the [place] FDA and  has been authorized for detection and/or diagnosis of SARS-CoV-2 by FDA under an Emergency Use Authorization (EUA). This EUA will remain  in effect (meaning this test can be used) for the duration of the COVID-19 declaration under Section 56 4(b)(1) of the Act, 21 U.S.C. section 360bbb-3(b)(1), unless the authorization is terminated or revoked sooner. Performed at [facility], [place] [address], [place], [place] [address]    Studies/Results: No results found.  Medications:  Prior to Admission:  Medications Prior to Admission  Medication Sig Dispense Refill Last Dose  . alendronate (FOSAMAX) 70 MG tablet Take 1 tablet (70 mg total) by mouth once a week. Take with a full glass of water on an empty stomach. 4 tablet 0 Past Week at Unknown time  . apixaban (ELIQUIS) 2.5 MG TABS tablet Take 1 tablet (2.5 mg total) by mouth 2 (two) times daily. 60 tablet 0 04/04/2019 at 0900  . Balsam [place]-Castor Oil (VENELEX) OINT Apply topically to sacrum and bilateral buttocks every shift and as needed for prevention   04/04/2019 at Unknown time  . diltiazem (CARDIZEM CD) 240 MG 24 hr capsule Take 1 capsule (240 mg total) by mouth daily. 30 capsule 0 04/04/2019 at Unknown time  . magnesium oxide (MAG-OX) 400 MG tablet Take 1 tablet (400 mg total) by mouth 3 (three) times daily. 90 tablet 0 04/04/2019 at Unknown time  . memantine (NAMENDA) 5 MG tablet Take 1 tablet (5 mg total) by mouth 2 (two) times daily. 60 tablet 0 04/04/2019  at Unknown time  . Multiple Vitamin (MULTIVITAMIN WITH MINERALS) TABS tablet Take 1 tablet by mouth daily.   04/04/2019 at Unknown time  . omeprazole (PRILOSEC) 20 MG capsule Take 1 capsule (20 mg total) by mouth every morning.   04/04/2019 at Unknown time  . Polyethyl Glycol-Propyl  Glycol (SYSTANE OP) Place 1 drop into both eyes daily as needed. Dry Eyes    04/04/2019 at Unknown time  . QUEtiapine (SEROQUEL) 25 MG tablet Take 1 tablet (25 mg total) by mouth at bedtime. 30 tablet 0 04/03/2019 at Unknown time  . sertraline (ZOLOFT) 50 MG tablet Take 1 tablet (50 mg total) by mouth every morning. 30 tablet 0 04/04/2019 at Unknown time  . acetaminophen (TYLENOL) 325 MG tablet Take 650 mg by mouth every 6 (six) hours as needed.     . calcium-vitamin D (OS-CAL 500 + D) 500-200 MG-UNIT tablet Take 1 tablet by mouth 2 (two) times daily. (Patient not taking: Reported on 02/28/2019)     . diphenoxylate-atropine (LOMOTIL) 2.5-0.025 MG tablet Take 1 tablet by mouth 4 (four) times daily as needed for diarrhea or loose stools. (Patient not taking: Reported on 02/28/2019) 10 tablet 0   . docusate sodium (COLACE) 100 MG capsule Take 1 capsule (100 mg total) by mouth 2 (two) times daily. (Patient not taking: Reported on 02/28/2019) 10 capsule 0   . feeding supplement, ENSURE ENLIVE, (ENSURE ENLIVE) LIQD Take 237 mLs by mouth 2 (two) times daily between meals. (Patient not taking: Reported on 02/28/2019) 237 mL 12   . NON FORMULARY Diet Type: NAS     . Ostomy Supplies (SKIN PREP WIPES) MISC Apply to bilateral heels every shift for prevention     . polyethylene glycol (MIRALAX / GLYCOLAX) 17 g packet Take 17 g by mouth daily as needed for mild constipation. (Patient not taking: Reported on 02/28/2019) 14 each 0    Scheduled: . apixaban  5 mg Oral BID  . calcium-vitamin D  1 tablet Oral BID  . diltiazem  360 mg Oral Daily  . feeding supplement (ENSURE ENLIVE)  237 mL Oral BID BM  . influenza vaccine adjuvanted  0.5 mL Intramuscular Tomorrow-1000  . magnesium oxide  400 mg Oral TID  . memantine  5 mg Oral BID  . metoprolol tartrate  25 mg Oral BID  . multivitamin with minerals  1 tablet Oral Daily  . pantoprazole  40 mg Oral Daily  . QUEtiapine  25 mg Oral QHS  . sertraline  50 mg Oral q morning -  10a  . sodium chloride flush  3 mL Intravenous Q12H   Continuous: . sodium chloride     [id number] chloride, acetaminophen **OR** acetaminophen, albuterol, ondansetron **OR** ondansetron (ZOFRAN) IV, polyethylene glycol, sodium chloride flush, traZODone  Assesment: She was admitted with atrial fibrillation with rapid ventricular response.  She is better.  She had acute diastolic heart failure and she has diuresed and is compensated at this time.  She has anxiety and depression which are stable  She has dementia which is fairly mild. Active Problems:   Chronic back pain   Anxiety and depression   Atrial fibrillation with rapid ventricular response (HCC)   GERD without esophagitis   Post-menopausal osteoporosis   Dementia ([place])   A-fib (HCC)   Atrial fibrillation ([place])    Plan: Discharge home today    LOS: 2 days   Alonza Bogus 04/08/2019, 7:47 AM

## 2019-04-08 NOTE — Progress Notes (Signed)
    Telemetry reviewed. The patient remains in atrial fibrillation but rates have improved. Mostly in the 70's to 80's overnight, in the 90's to 110's with activity this AM. Patient has been discharged by the admitting team. Will continue on Eliquis 5mg  BID (adjusted this admission given change in weight), Cardizem CD 360mg  daily and Lopressor 25mg  BID. Cardiology hospital follow-up arranged for 04/24/2019 and included in AVS.   Signed, Erma Heritage, PA-C 04/08/2019, 8:22 AM Pager: [phone]

## 2019-04-10 ENCOUNTER — Inpatient Hospital Stay (HOSPITAL_COMMUNITY)
Admission: EM | Admit: 2019-04-10 | Discharge: 2019-04-19 | DRG: 682 | Disposition: A | Discharge status: Skilled Nursing Facility | Payer: PPO | Attending: Family Medicine | Admitting: Family Medicine

## 2019-04-10 ENCOUNTER — Inpatient Hospital Stay (HOSPITAL_COMMUNITY): Payer: PPO

## 2019-04-10 ENCOUNTER — Other Ambulatory Visit: Payer: Self-pay

## 2019-04-10 ENCOUNTER — Encounter (HOSPITAL_COMMUNITY): Payer: Self-pay | Admitting: Emergency Medicine

## 2019-04-10 DIAGNOSIS — G8929 Other chronic pain: Secondary | ICD-10-CM | POA: Diagnosis not present

## 2019-04-10 DIAGNOSIS — Z515 Encounter for palliative care: Secondary | ICD-10-CM | POA: Diagnosis not present

## 2019-04-10 DIAGNOSIS — N179 Acute kidney failure, unspecified: Secondary | ICD-10-CM | POA: Diagnosis present

## 2019-04-10 DIAGNOSIS — I4891 Unspecified atrial fibrillation: Secondary | ICD-10-CM | POA: Diagnosis not present

## 2019-04-10 DIAGNOSIS — E875 Hyperkalemia: Secondary | ICD-10-CM | POA: Diagnosis present

## 2019-04-10 DIAGNOSIS — Z8249 Family history of ischemic heart disease and other diseases of the circulatory system: Secondary | ICD-10-CM

## 2019-04-10 DIAGNOSIS — N390 Urinary tract infection, site not specified: Secondary | ICD-10-CM | POA: Diagnosis not present

## 2019-04-10 DIAGNOSIS — R5381 Other malaise: Secondary | ICD-10-CM | POA: Diagnosis not present

## 2019-04-10 DIAGNOSIS — I342 Nonrheumatic mitral (valve) stenosis: Secondary | ICD-10-CM | POA: Diagnosis present

## 2019-04-10 DIAGNOSIS — M810 Age-related osteoporosis without current pathological fracture: Secondary | ICD-10-CM | POA: Diagnosis not present

## 2019-04-10 DIAGNOSIS — J9601 Acute respiratory failure with hypoxia: Secondary | ICD-10-CM | POA: Diagnosis not present

## 2019-04-10 DIAGNOSIS — K859 Acute pancreatitis without necrosis or infection, unspecified: Secondary | ICD-10-CM | POA: Diagnosis not present

## 2019-04-10 DIAGNOSIS — K219 Gastro-esophageal reflux disease without esophagitis: Secondary | ICD-10-CM | POA: Diagnosis not present

## 2019-04-10 DIAGNOSIS — I4821 Permanent atrial fibrillation: Secondary | ICD-10-CM | POA: Diagnosis not present

## 2019-04-10 DIAGNOSIS — R001 Bradycardia, unspecified: Secondary | ICD-10-CM | POA: Diagnosis present

## 2019-04-10 DIAGNOSIS — A084 Viral intestinal infection, unspecified: Secondary | ICD-10-CM | POA: Diagnosis not present

## 2019-04-10 DIAGNOSIS — I5033 Acute on chronic diastolic (congestive) heart failure: Secondary | ICD-10-CM | POA: Diagnosis not present

## 2019-04-10 DIAGNOSIS — Z79899 Other long term (current) drug therapy: Secondary | ICD-10-CM

## 2019-04-10 DIAGNOSIS — R0602 Shortness of breath: Secondary | ICD-10-CM | POA: Diagnosis not present

## 2019-04-10 DIAGNOSIS — Z7983 Long term (current) use of bisphosphonates: Secondary | ICD-10-CM

## 2019-04-10 DIAGNOSIS — I5031 Acute diastolic (congestive) heart failure: Secondary | ICD-10-CM | POA: Diagnosis not present

## 2019-04-10 DIAGNOSIS — K2980 Duodenitis without bleeding: Secondary | ICD-10-CM | POA: Diagnosis present

## 2019-04-10 DIAGNOSIS — R531 Weakness: Secondary | ICD-10-CM | POA: Diagnosis not present

## 2019-04-10 DIAGNOSIS — B373 Candidiasis of vulva and vagina: Secondary | ICD-10-CM | POA: Diagnosis present

## 2019-04-10 DIAGNOSIS — I959 Hypotension, unspecified: Secondary | ICD-10-CM | POA: Diagnosis present

## 2019-04-10 DIAGNOSIS — R2689 Other abnormalities of gait and mobility: Secondary | ICD-10-CM | POA: Diagnosis not present

## 2019-04-10 DIAGNOSIS — E860 Dehydration: Secondary | ICD-10-CM | POA: Diagnosis not present

## 2019-04-10 DIAGNOSIS — N170 Acute kidney failure with tubular necrosis: Secondary | ICD-10-CM | POA: Diagnosis not present

## 2019-04-10 DIAGNOSIS — F0390 Unspecified dementia without behavioral disturbance: Secondary | ICD-10-CM | POA: Diagnosis not present

## 2019-04-10 DIAGNOSIS — J90 Pleural effusion, not elsewhere classified: Secondary | ICD-10-CM | POA: Diagnosis not present

## 2019-04-10 DIAGNOSIS — R41841 Cognitive communication deficit: Secondary | ICD-10-CM | POA: Diagnosis not present

## 2019-04-10 DIAGNOSIS — Z7901 Long term (current) use of anticoagulants: Secondary | ICD-10-CM | POA: Diagnosis not present

## 2019-04-10 DIAGNOSIS — E871 Hypo-osmolality and hyponatremia: Secondary | ICD-10-CM | POA: Diagnosis not present

## 2019-04-10 DIAGNOSIS — Z20828 Contact with and (suspected) exposure to other viral communicable diseases: Secondary | ICD-10-CM | POA: Diagnosis not present

## 2019-04-10 DIAGNOSIS — Z90710 Acquired absence of both cervix and uterus: Secondary | ICD-10-CM | POA: Diagnosis not present

## 2019-04-10 DIAGNOSIS — Z9049 Acquired absence of other specified parts of digestive tract: Secondary | ICD-10-CM

## 2019-04-10 DIAGNOSIS — Z7189 Other specified counseling: Secondary | ICD-10-CM | POA: Diagnosis not present

## 2019-04-10 DIAGNOSIS — M6281 Muscle weakness (generalized): Secondary | ICD-10-CM | POA: Diagnosis not present

## 2019-04-10 DIAGNOSIS — J918 Pleural effusion in other conditions classified elsewhere: Secondary | ICD-10-CM | POA: Diagnosis not present

## 2019-04-10 DIAGNOSIS — I4811 Longstanding persistent atrial fibrillation: Secondary | ICD-10-CM

## 2019-04-10 DIAGNOSIS — R69 Illness, unspecified: Secondary | ICD-10-CM | POA: Diagnosis not present

## 2019-04-10 DIAGNOSIS — Z743 Need for continuous supervision: Secondary | ICD-10-CM | POA: Diagnosis not present

## 2019-04-10 DIAGNOSIS — F0391 Unspecified dementia with behavioral disturbance: Secondary | ICD-10-CM | POA: Diagnosis not present

## 2019-04-10 DIAGNOSIS — R1111 Vomiting without nausea: Secondary | ICD-10-CM | POA: Diagnosis not present

## 2019-04-10 DIAGNOSIS — R112 Nausea with vomiting, unspecified: Secondary | ICD-10-CM

## 2019-04-10 LAB — BASIC METABOLIC PANEL
Anion gap: 10 (ref 5–15)
BUN: 36 mg/dL — ABNORMAL HIGH (ref 8–23)
CO2: 23 mmol/L (ref 22–32)
Calcium: 8.6 mg/dL — ABNORMAL LOW (ref 8.9–10.3)
Chloride: 99 mmol/L (ref 98–111)
Creatinine, Ser: 2.01 mg/dL — ABNORMAL HIGH (ref 0.44–1.00)
GFR calc Af Amer: 26 mL/min — ABNORMAL LOW (ref >60)
GFR calc non Af Amer: 22 mL/min — ABNORMAL LOW (ref >60)
Glucose, Bld: 164 mg/dL — ABNORMAL HIGH (ref 70–99)
Potassium: 6.7 mmol/L (ref 3.5–5.1)
Sodium: 132 mmol/L — ABNORMAL LOW (ref 135–145)

## 2019-04-10 LAB — COMPREHENSIVE METABOLIC PANEL
ALT: 100 U/L — ABNORMAL HIGH (ref 0–44)
AST: 140 U/L — ABNORMAL HIGH (ref 15–41)
Albumin: 3.2 g/dL — ABNORMAL LOW (ref 3.5–5.0)
Alkaline Phosphatase: 128 U/L — ABNORMAL HIGH (ref 38–126)
Anion gap: 12 (ref 5–15)
BUN: 35 mg/dL — ABNORMAL HIGH (ref 8–23)
CO2: 26 mmol/L (ref 22–32)
Calcium: 9 mg/dL (ref 8.9–10.3)
Chloride: 99 mmol/L (ref 98–111)
Creatinine, Ser: 2.06 mg/dL — ABNORMAL HIGH (ref 0.44–1.00)
GFR calc Af Amer: 25 mL/min — ABNORMAL LOW (ref >60)
GFR calc non Af Amer: 22 mL/min — ABNORMAL LOW (ref >60)
Glucose, Bld: 225 mg/dL — ABNORMAL HIGH (ref 70–99)
Potassium: 4.9 mmol/L (ref 3.5–5.1)
Sodium: 137 mmol/L (ref 135–145)
Total Bilirubin: 2.4 mg/dL — ABNORMAL HIGH (ref 0.3–1.2)
Total Protein: 5.8 g/dL — ABNORMAL LOW (ref 6.5–8.1)

## 2019-04-10 LAB — CBC
HCT: 39 % (ref 36.0–46.0)
Hemoglobin: 11.6 g/dL — ABNORMAL LOW (ref 12.0–15.0)
MCH: 26.2 pg (ref 26.0–34.0)
MCHC: 29.7 g/dL — ABNORMAL LOW (ref 30.0–36.0)
MCV: 88.2 fL (ref 80.0–100.0)
Platelets: 273 10*3/uL (ref 150–400)
RBC: 4.42 MIL/uL (ref 3.87–5.11)
RDW: 16.1 % — ABNORMAL HIGH (ref 11.5–15.5)
WBC: 10.3 10*3/uL (ref 4.0–10.5)
nRBC: 0 % (ref 0.0–0.2)

## 2019-04-10 LAB — MRSA PCR SCREENING: MRSA by PCR: NEGATIVE

## 2019-04-10 LAB — TROPONIN I (HIGH SENSITIVITY)
Troponin I (High Sensitivity): 7 ng/L (ref <18)
Troponin I (High Sensitivity): 8 ng/L (ref <18)

## 2019-04-10 LAB — CBG MONITORING, ED: Glucose-Capillary: 153 mg/dL — ABNORMAL HIGH (ref 70–99)

## 2019-04-10 LAB — SARS CORONAVIRUS 2 (TAT 6-24 HRS): SARS Coronavirus 2: NEGATIVE

## 2019-04-10 MED ORDER — DEXTROSE 50 % IV SOLN
1.0000 | Freq: Once | INTRAVENOUS | Status: AC
  Administered 2019-04-10: 03:00:00 50 mL via INTRAVENOUS
  Filled 2019-04-10: qty 50

## 2019-04-10 MED ORDER — CALCIUM GLUCONATE-NACL 1-0.675 GM/50ML-% IV SOLN
INTRAVENOUS | Status: AC
  Filled 2019-04-10: qty 50

## 2019-04-10 MED ORDER — CALCIUM GLUCONATE-NACL 1-0.675 GM/50ML-% IV SOLN
1.0000 g | Freq: Once | INTRAVENOUS | Status: AC
  Administered 2019-04-10: 1000 mg via INTRAVENOUS

## 2019-04-10 MED ORDER — DILTIAZEM HCL ER COATED BEADS 360 MG PO CP24: 360.0000 mg | ORAL_CAPSULE | Freq: Every day | ORAL | Status: DC

## 2019-04-10 MED ORDER — CHLORHEXIDINE GLUCONATE CLOTH 2 % EX PADS
6.0000 | MEDICATED_PAD | Freq: Every day | CUTANEOUS | Status: DC
  Administered 2019-04-10 – 2019-04-19 (×9): 6 via TOPICAL

## 2019-04-10 MED ORDER — POLYETHYLENE GLYCOL 3350 17 G PO PACK: 17.0000 g | PACK | Freq: Every day | ORAL | Status: DC | PRN

## 2019-04-10 MED ORDER — QUETIAPINE FUMARATE 25 MG PO TABS
25.0000 mg | ORAL_TABLET | Freq: Every day | ORAL | Status: DC
  Administered 2019-04-10 – 2019-04-18 (×9): 25 mg via ORAL
  Filled 2019-04-10 (×9): qty 1

## 2019-04-10 MED ORDER — SERTRALINE HCL 50 MG PO TABS
50.0000 mg | ORAL_TABLET | Freq: Every morning | ORAL | Status: DC
  Administered 2019-04-10 – 2019-04-19 (×10): 50 mg via ORAL
  Filled 2019-04-10 (×10): qty 1

## 2019-04-10 MED ORDER — ONDANSETRON HCL 4 MG/2ML IJ SOLN
4.0000 mg | Freq: Once | INTRAMUSCULAR | Status: AC
  Administered 2019-04-10: 02:00:00 4 mg via INTRAVENOUS
  Filled 2019-04-10: qty 2

## 2019-04-10 MED ORDER — ONDANSETRON HCL 4 MG/2ML IJ SOLN
4.0000 mg | Freq: Four times a day (QID) | INTRAMUSCULAR | Status: DC | PRN
  Administered 2019-04-14 – 2019-04-17 (×2): 4 mg via INTRAVENOUS
  Filled 2019-04-10 (×2): qty 2

## 2019-04-10 MED ORDER — DILTIAZEM HCL 25 MG/5ML IV SOLN: 5.0000 mg | Freq: Four times a day (QID) | INTRAVENOUS | Status: DC | PRN

## 2019-04-10 MED ORDER — SODIUM CHLORIDE 0.9 % IV SOLN: 1.0000 g | Freq: Once | INTRAVENOUS | Status: DC

## 2019-04-10 MED ORDER — ACETAMINOPHEN 325 MG PO TABS
650.0000 mg | ORAL_TABLET | Freq: Four times a day (QID) | ORAL | Status: DC | PRN
  Administered 2019-04-10 – 2019-04-14 (×4): 650 mg via ORAL
  Filled 2019-04-10 (×4): qty 2

## 2019-04-10 MED ORDER — SODIUM POLYSTYRENE SULFONATE 15 GM/60ML PO SUSP
15.0000 g | Freq: Once | ORAL | Status: AC
  Administered 2019-04-10: 11:00:00 15 g via ORAL
  Filled 2019-04-10: qty 60

## 2019-04-10 MED ORDER — DIPHENOXYLATE-ATROPINE 2.5-0.025 MG PO TABS
1.0000 | ORAL_TABLET | Freq: Four times a day (QID) | ORAL | Status: DC | PRN
  Administered 2019-04-13: 15:00:00 1 via ORAL
  Filled 2019-04-10: qty 1

## 2019-04-10 MED ORDER — SODIUM BICARBONATE 8.4 % IV SOLN
50.0000 meq | Freq: Once | INTRAVENOUS | Status: AC
  Administered 2019-04-10: 03:00:00 50 meq via INTRAVENOUS

## 2019-04-10 MED ORDER — SODIUM CHLORIDE 0.9% FLUSH: 3.0000 mL | Freq: Once | INTRAVENOUS | Status: DC

## 2019-04-10 MED ORDER — APIXABAN 2.5 MG PO TABS
2.5000 mg | ORAL_TABLET | Freq: Two times a day (BID) | ORAL | Status: DC
  Administered 2019-04-10 – 2019-04-11 (×4): 2.5 mg via ORAL
  Filled 2019-04-10 (×6): qty 1

## 2019-04-10 MED ORDER — INSULIN ASPART 100 UNIT/ML ~~LOC~~ SOLN
5.0000 [IU] | Freq: Once | SUBCUTANEOUS | Status: AC
  Administered 2019-04-10: 5 [IU] via INTRAVENOUS
  Filled 2019-04-10: qty 1

## 2019-04-10 MED ORDER — MEMANTINE HCL 10 MG PO TABS
5.0000 mg | ORAL_TABLET | Freq: Two times a day (BID) | ORAL | Status: DC
  Administered 2019-04-10 – 2019-04-19 (×18): 5 mg via ORAL
  Filled 2019-04-10 (×22): qty 1

## 2019-04-10 MED ORDER — SODIUM CHLORIDE 0.9 % IV SOLN
INTRAVENOUS | Status: AC
  Administered 2019-04-10: 04:00:00 via INTRAVENOUS

## 2019-04-10 MED ORDER — DILTIAZEM HCL 60 MG PO TABS
90.0000 mg | ORAL_TABLET | Freq: Four times a day (QID) | ORAL | Status: DC
  Administered 2019-04-11 – 2019-04-12 (×5): 90 mg via ORAL
  Filled 2019-04-10 (×5): qty 1

## 2019-04-10 MED ORDER — SODIUM CHLORIDE 0.9 % IV SOLN
INTRAVENOUS | Status: DC
  Administered 2019-04-11 – 2019-04-12 (×3): via INTRAVENOUS

## 2019-04-10 MED ORDER — SODIUM CHLORIDE 0.9 % IV BOLUS
500.0000 mL | Freq: Once | INTRAVENOUS | Status: AC
  Administered 2019-04-10: 01:00:00 500 mL via INTRAVENOUS

## 2019-04-10 MED ORDER — SODIUM CHLORIDE 0.9 % IV SOLN
1.0000 g | Freq: Once | INTRAVENOUS | Status: DC
  Filled 2019-04-10: qty 10

## 2019-04-10 MED ORDER — ACETAMINOPHEN 650 MG RE SUPP: 650.0000 mg | Freq: Four times a day (QID) | RECTAL | Status: DC | PRN

## 2019-04-10 MED ORDER — SODIUM CHLORIDE 0.9 % IV BOLUS
500.0000 mL | Freq: Once | INTRAVENOUS | Status: AC
  Administered 2019-04-10: 03:00:00 500 mL via INTRAVENOUS

## 2019-04-10 MED ORDER — SODIUM CHLORIDE 0.9 % IV BOLUS
250.0000 mL | Freq: Once | INTRAVENOUS | Status: AC
  Administered 2019-04-10: 250 mL via INTRAVENOUS

## 2019-04-10 MED ORDER — POLYVINYL ALCOHOL 1.4 % OP SOLN: 1.0000 [drp] | Freq: Every day | OPHTHALMIC | Status: DC | PRN

## 2019-04-10 MED ORDER — PANTOPRAZOLE SODIUM 40 MG PO TBEC
40.0000 mg | DELAYED_RELEASE_TABLET | Freq: Every day | ORAL | Status: DC
  Administered 2019-04-10 – 2019-04-19 (×10): 40 mg via ORAL
  Filled 2019-04-10 (×10): qty 1

## 2019-04-10 MED ORDER — MAGNESIUM OXIDE 400 (241.3 MG) MG PO TABS
400.0000 mg | ORAL_TABLET | Freq: Three times a day (TID) | ORAL | Status: DC
  Administered 2019-04-10 – 2019-04-19 (×27): 400 mg via ORAL
  Filled 2019-04-10 (×27): qty 1

## 2019-04-10 MED ORDER — SODIUM POLYSTYRENE SULFONATE 15 GM/60ML PO SUSP: 30.0000 g | Freq: Once | ORAL | Status: DC

## 2019-04-10 NOTE — ED Triage Notes (Signed)
Pt from home via [facility]. Pt D/C from hospital yesterday. Pt states since arriving home she was C/O N/V. Pt hypotensive en route with EMS (50 systolic). Pt given 571ml NS.

## 2019-04-10 NOTE — ED Notes (Signed)
Date and time results received: 04/10/19 0155  Test: potassium Critical Value: 6.7  Name of Provider Notified: Tomi Bamberger  Orders Received? Or Actions Taken?: n/a

## 2019-04-10 NOTE — Progress Notes (Signed)
ANTICOAGULATION CONSULT NOTE - Initial Consult  Pharmacy Consult for apixaban Indication: atrial fibrillation  Allergies  Allergen Reactions  . Penicillins Anaphylaxis  . Emetrol Nausea Only    swelling  . Feldene [Piroxicam] Nausea And Vomiting  . Sulfa Antibiotics     swelling    Patient Measurements: Weight: 137 lb (62.1 kg) Heparin Dosing Weight: 62.2 kg  Vital Signs: BP: 95/58 (10/01 0300) Pulse Rate: 71 (10/01 0300)  Labs: Recent Labs    04/10/19 0104  HGB 11.6*  HCT 39.0  PLT 273  CREATININE 2.01*    Estimated Creatinine Clearance: 18 mL/min (A) (by C-G formula based on SCr of 2.01 mg/dL (H)).   Medical History: Past Medical History:  Diagnosis Date  . Atrial fibrillation ([place])   . Chronic back pain   . Diarrhea   . Dysphagia 05/03/2016  . GERD (gastroesophageal reflux disease)     Assessment: Kristy Mckinney  is a 85 y.o. female, w persistent atrial fibrillation on chronic anticoagulation with apixaban. Pharmacy consulted to dose apixaban. Patient has been on apixaban 5 mg BID, however she is > 80yo and SCr is currently > 1.5 (baseline is < 1). Patients weight is very close to the cutoff as well at 62 kg. RN verified patient took her daily medications 9/30. (was just discharged on 9/29).   Goal of Therapy:  Anticoagulation Monitor platelets by anticoagulation protocol: Yes   Plan:  Decrease apixaban dose to 2.5 mg PO BID Follow up renal function and monitor weight Monitor for signs and symptoms of bleeding   Thank you for allowing [place] to participate in this patients care. Jens Som, PharmD 04/10/2019,3:53 AM

## 2019-04-10 NOTE — ED Notes (Signed)
Pt daughter, cathy, called by this nurse and updated on pt status and plan of care as requested.

## 2019-04-10 NOTE — Consult Note (Addendum)
Cardiology Consult    Patient ID: Kristy Mckinney; [id number]; 12-19-1931   Admit date: 04/10/2019 Date of Consult: 04/10/2019  Primary Care Provider: [name], MD Primary Cardiologist: Rozann Lesches, MD   Patient Profile    Kristy Mckinney is a 85 y.o. female with past medical history of permanent atrial fibrillation (diagnosed in 11/2018), mitral stenosis and chronic back pain who is being seen today for the evaluation of atrial fibrillation with bradycardia at the request of Dr. Luan Pulling.   History of Present Illness    Ms. Crocco was most recently admitted to [facility] from 9/25 - 04/08/2019 for evaluation of worsening dyspnea and found to have atrial fibrillation with RVR along with an acute diastolic CHF exacerbation. Initially started on IV Cardizem and was transitioned back to Cardizem CD with dose titration to 360mg  daily. Rates remained elevated in the 110's to 120's, therefore Lopressor was added at 25mg  BID prior to discharge.   She presented back to the ED during the early morning hours of 04/10/2019 for evaluation of nausea and vomiting with a reported SBP in the 50's by EMS arrival which improved with a 500 mL fluid bolus. Was given a second IVF bolus in the ED with SBP improving into the 80's. Initial labs showed WBC 10.3, Hgb 11.6, platelets 273, Na+ 132, K+ 6.7, and creatinine 2.01 (was 0.85 at the time of recent hospital discharge). Initial and delta HS Troponin values negative at 7 and 8. EKG shows atrial fibrillation with slow-ventricular response, HR 46 with inferior infarct pattern.   Was started on IV calcium gluconate, 1 amp Bicarb, Insulin and D50 due to her hyperkalemia and repeat labs showed her K+ had improved to 4.9. Given her HR in the 40's while in the ED, she was continued on Cardizem CD 360mg  daily with Lopressor being held.   In talking with the patient, she reports overall feeling well following hospital discharge but yesterday afternoon she  developed acute nausea and diarrhea. Says she consumed a Chicken Salad sandwich from [employer] but this is something she does at least twice weekly. Her nausea persisted and she felt very weak, therefore she came to the ED for further evaluation. She denies any associated chest pain, dyspnea, palpitations, or presyncope.   Now feels back to baseline and is asking when can she go home. HR has improved into the 70's to 80's. SBP currently in the 100's.   Past Medical History:  Diagnosis Date  . Atrial fibrillation ([place])   . Chronic back pain   . Diarrhea   . Dysphagia 05/03/2016  . GERD (gastroesophageal reflux disease)     Past Surgical History:  Procedure Laterality Date  . ABDOMINAL HYSTERECTOMY    . BACK SURGERY    . CHOLECYSTECTOMY N/A 01/29/2014   Procedure: LAPAROSCOPIC CHOLECYSTECTOMY;  Surgeon: Scherry Ran, MD;  Location: [facility];  Service: General;  Laterality: N/A;  . COLONOSCOPY N/A 10/27/2015   Procedure: COLONOSCOPY;  Surgeon: Rogene Houston, MD;  Location: [facility];  Service: Endoscopy;  Laterality: N/A;  215  . INTRAMEDULLARY (IM) NAIL INTERTROCHANTERIC Right 11/29/2018   Procedure: INTRAMEDULLARY (IM) NAIL INTERTROCHANTRIC FRACTURE;  Surgeon: Renette Butters, MD;  Location: [place];  Service: Orthopedics;  Laterality: Right;  . TONSILLECTOMY    . YAG LASER APPLICATION Left [id number]   Procedure: YAG LASER APPLICATION;  Surgeon: Elta Guadeloupe T. Gershon Crane, MD;  Location: [facility];  Service: Ophthalmology;  Laterality: Left;  left  Home Medications:  Prior to Admission medications   Medication Sig Start Date End Date Taking? Authorizing Provider  acetaminophen (TYLENOL) 325 MG tablet Take 650 mg by mouth every 6 (six) hours as needed. 12/05/18   [provider]  alendronate (FOSAMAX) 70 MG tablet Take 1 tablet (70 mg total) by mouth once a week. Take with a full glass of water on an empty stomach. 12/23/18   Gerlene Fee, NP  apixaban (ELIQUIS) 5  MG TABS tablet Take 1 tablet (5 mg total) by mouth 2 (two) times daily. 04/07/19   [name], MD  Balsam [place]-Castor Oil [facility]) OINT Apply topically to sacrum and bilateral buttocks every shift and as needed for prevention 12/06/18   [provider]  calcium-vitamin D (OS-CAL 500 + D) 500-200 MG-UNIT tablet Take 1 tablet by mouth 2 (two) times daily. Patient not taking: Reported on 02/28/2019 12/05/18   Desiree Hane, MD  diltiazem (CARDIZEM CD) 360 MG 24 hr capsule Take 1 capsule (360 mg total) by mouth daily. 04/07/19   [name], MD  diphenoxylate-atropine (LOMOTIL) 2.5-0.025 MG tablet Take 1 tablet by mouth 4 (four) times daily as needed for diarrhea or loose stools. Patient not taking: Reported on 02/28/2019 12/23/18   Gerlene Fee, NP  docusate sodium (COLACE) 100 MG capsule Take 1 capsule (100 mg total) by mouth 2 (two) times daily. Patient not taking: Reported on 02/28/2019 12/05/18   Oretha Milch D, MD  feeding supplement, ENSURE ENLIVE, (ENSURE ENLIVE) LIQD Take 237 mLs by mouth 2 (two) times daily between meals. Patient not taking: Reported on 02/28/2019 12/05/18   Oretha Milch D, MD  magnesium oxide (MAG-OX) 400 MG tablet Take 1 tablet (400 mg total) by mouth 3 (three) times daily. 12/23/18   Gerlene Fee, NP  memantine (NAMENDA) 5 MG tablet Take 1 tablet (5 mg total) by mouth 2 (two) times daily. 12/23/18   Gerlene Fee, NP  metoprolol tartrate (LOPRESSOR) 25 MG tablet Take 1 tablet (25 mg total) by mouth 2 (two) times daily. 04/08/19   [name], MD  Multiple Vitamin (MULTIVITAMIN WITH MINERALS) TABS tablet Take 1 tablet by mouth daily. 12/05/18   Desiree Hane, MD  NON FORMULARY Diet Type: NAS 12/05/18   [provider]  omeprazole (PRILOSEC) 20 MG capsule Take 1 capsule (20 mg total) by mouth every morning. 12/05/18   Desiree Hane, MD  Ostomy Supplies (SKIN PREP WIPES) MISC Apply to bilateral heels every shift for prevention 12/06/18    [provider]  Polyethyl Glycol-Propyl Glycol (SYSTANE OP) Place 1 drop into both eyes daily as needed. Dry Eyes     [provider]  polyethylene glycol (MIRALAX / GLYCOLAX) 17 g packet Take 17 g by mouth daily as needed for mild constipation. Patient not taking: Reported on 02/28/2019 12/05/18   Oretha Milch D, MD  QUEtiapine (SEROQUEL) 25 MG tablet Take 1 tablet (25 mg total) by mouth at bedtime. 12/23/18   Gerlene Fee, NP  sertraline (ZOLOFT) 50 MG tablet Take 1 tablet (50 mg total) by mouth every morning. 12/23/18   Gerlene Fee, NP    Inpatient Medications: Scheduled Meds: . apixaban  2.5 mg Oral BID  . diltiazem  90 mg Oral Q6H  . magnesium oxide  400 mg Oral TID  . memantine  5 mg Oral BID  . pantoprazole  40 mg Oral Daily  . QUEtiapine  25 mg Oral QHS  . sertraline  50  mg Oral q morning - 10a  . sodium chloride flush  3 mL Intravenous Once  . sodium polystyrene  15 g Oral Once   Continuous Infusions: . sodium chloride 50 mL/hr at 04/10/19 0422   PRN Meds: acetaminophen **OR** acetaminophen, diphenoxylate-atropine, ondansetron (ZOFRAN) IV, polyethylene glycol, Polyethyl Glycol-Propyl Glycol  Allergies:    Allergies  Allergen Reactions  . Penicillins Anaphylaxis  . Emetrol Nausea Only    swelling  . Feldene [Piroxicam] Nausea And Vomiting  . Sulfa Antibiotics     swelling    Social History:   Social History   Socioeconomic History  . Marital status: Married    Spouse name: Not on file  . Number of children: Not on file  . Years of education: Not on file  . Highest education level: Not on file  Occupational History  . Not on file  Social Needs  . Financial resource strain: Not on file  . Food insecurity    Worry: Not on file    Inability: Not on file  . Transportation needs    Medical: Not on file    Non-medical: Not on file  Tobacco Use  . Smoking status: Never Smoker  . Smokeless tobacco: Never Used  Substance and Sexual  Activity  . Alcohol use: No  . Drug use: No  . Sexual activity: Yes    Birth control/protection: Surgical  Lifestyle  . Physical activity    Days per week: Not on file    Minutes per session: Not on file  . Stress: Not on file  Relationships  . Social Herbalist on phone: Not on file    Gets together: Not on file    Attends religious service: Not on file    Active member of club or organization: Not on file    Attends meetings of clubs or organizations: Not on file    Relationship status: Not on file  . Intimate partner violence    Fear of current or ex partner: Not on file    Emotionally abused: Not on file    Physically abused: Not on file    Forced sexual activity: Not on file  Other Topics Concern  . Not on file  Social History Narrative  . Not on file     Family History:    Family History  Problem Relation Age of Onset  . Hypertension Child       Review of Systems    General:  No chills, fever, night sweats or weight changes. Positive for generalized weakness.  Cardiovascular:  No chest pain, dyspnea on exertion, edema, orthopnea, palpitations, paroxysmal nocturnal dyspnea. Dermatological: No rash, lesions/masses Respiratory: No cough, dyspnea Urologic: No hematuria, dysuria Abdominal:   No bright red blood per rectum, melena, or hematemesis. Positive for nausea and diarrhea.  Neurologic:  No visual changes, changes in mental status. All other systems reviewed and are otherwise negative except as noted above.  Physical Exam/Data    Vitals:   04/10/19 0430 04/10/19 0515 04/10/19 0630 04/10/19 0645  BP: 101/62 102/67 (!) 94/53 (!) 88/57  Pulse: 67 74 80 71  Resp: 19 19 18 18   SpO2: 100% 100% 98% 99%  Weight:        Intake/Output Summary (Last 24 hours) at 04/10/2019 0941 Last data filed at 04/10/2019 0350 Gross per 24 hour  Intake 1050 ml  Output -  Net 1050 ml   Filed Weights   04/10/19 0045  Weight: 62.1 kg  Body mass index is 23.52  kg/m.   General: Pleasant elderly female appearing in NAD Psych: Normal affect. Neuro: Alert and oriented X 3. Moves all extremities spontaneously. HEENT: Normal  Neck: Supple without bruits or JVD. Lungs:  Resp regular and unlabored, CTA without wheezing or rales. Heart: Irregularly irregular. No s3, s4, or murmurs. Abdomen: Soft, non-tender, non-distended, BS + x 4.  Extremities: No clubbing, cyanosis or lower extremity edema. DP/PT/Radials 2+ and equal bilaterally.   EKG:  The EKG was personally reviewed and demonstrates: Atrial fibrillation with slow-ventricular response, HR 46 with inferior infarct pattern.   Telemetry:  Telemetry was personally reviewed and demonstrates: Atrial fibrillation with HR initially in the 40's to 50's, improved into the 70's to 80's.     Labs/Studies     Relevant CV Studies:  Echocardiogram: 04/04/2019 IMPRESSIONS   1. Left ventricular ejection fraction, by visual estimation, is 70 to 75%. The left ventricle has hyperdynamic function. Decreased left ventricular size. There is moderately increased left ventricular hypertrophy.  2. Left ventricular diastolic Doppler parameters are indeterminate pattern of LV diastolic filling.  3. Right ventricular volume and pressure overload.  4. Global right ventricle has normal systolic function.The right ventricular size is moderately enlarged. Mildly increased right ventricular wall thickness.  5. Left atrial size was severely dilated.  6. Right atrial size was normal.  7. Presence of pericardial fat pad.  8. Moderate aortic valve annular calcification.  9. Moderate to severe mitral annular calcification. 10. Severe calcification of the mitral valve leaflet(s). Valve appears stenotic, not completely evaluated. Suggest follow-up images with VTI measurements or planimetry if possible. 11. The mitral valve is degenerative. Mild mitral valve regurgitation. 12. The tricuspid valve is grossly normal. Tricuspid  valve regurgitation is mild. 13. Aortic valve area, by VTI measures 1.83 cm. 14. Aortic valve mean gradient measures 8.8 mmHg. 15. The aortic valve is tricuspid Aortic valve regurgitation was not visualized by color flow Doppler. Mild aortic valve stenosis. 16. Moderately elevated pulmonary artery systolic pressure. 17. The tricuspid regurgitant velocity is 3.67 m/s, and with an assumed right atrial pressure of 8 mmHg, the estimated right ventricular systolic pressure is moderately elevated at 61.9 mmHg. 18. The inferior vena cava is normal in size with <50% respiratory variability, suggesting right atrial pressure of 8 mmHg. 19. The pulmonic valve was grossly normal. Pulmonic valve regurgitation is trivial by color flow Doppler.  Laboratory Data:  Chemistry Recent Labs  Lab 04/05/19 0632 04/10/19 0104 04/10/19 0357  NA 140 132* 137  K 3.5 6.7* 4.9  CL 100 99 99  CO2 30 23 26   GLUCOSE 101* 164* 225*  BUN 20 36* 35*  CREATININE 0.85 2.01* 2.06*  CALCIUM 9.4 8.6* 9.0  GFRNONAA >60 22* 22*  GFRAA >60 26* 25*  ANIONGAP 10 10 12     Recent Labs  Lab 04/10/19 0357  PROT 5.8*  ALBUMIN 3.2*  AST 140*  ALT 100*  ALKPHOS 128*  BILITOT 2.4*   Hematology Recent Labs  Lab 04/04/19 0714 04/05/19 0632 04/10/19 0104  WBC 5.3 6.0 10.3  RBC 4.06 4.43 4.42  HGB 10.8* 11.6* 11.6*  HCT 34.9* 37.7 39.0  MCV 86.0 85.1 88.2  MCH 26.6 26.2 26.2  MCHC 30.9 30.8 29.7*  RDW 17.1* 16.8* 16.1*  PLT 198 256 273   Cardiac EnzymesNo results for input(s): TROPONINI in the last 168 hours. No results for input(s): TROPIPOC in the last 168 hours.  BNP Recent Labs  Lab 04/04/19 0714  BNP 305.0*  DDimer  Recent Labs  Lab 04/04/19 [id number]  DDIMER 1.03*    Radiology/Studies:  US Renal  Result Date: April 16, 2019 CLINICAL DATA:  Acute renal failure EXAM: RENAL / URINARY TRACT ULTRASOUND COMPLETE COMPARISON:  05/11/2015 FINDINGS: Right Kidney: Renal measurements: 10.5 x 4.8 x 5.4 cm. =  volume: 143 mL . Echogenicity within normal limits. No mass or hydronephrosis visualized. Left Kidney: Renal measurements: 11.4 x 4.7 x 6.3 cm. = volume: 177 mL. Echogenicity within normal limits. No mass or hydronephrosis visualized. Bladder: Decompressed IMPRESSION: Normal appearing renal ultrasound. Electronically Signed   By: Inez Catalina M.D.   On: 2019-04-16 09:08     Assessment & Plan    1. Permanent Atrial Fibrillation - Recently admitted with atrial fibrillation with RVR but found to be in atrial fibrillation with slow ventricular response and heart rate in the 40's in the setting of AKI, hypotension, possible GI illness, and hyperkalemia  (K+ 6.7). With correction of her hyperkalemia, her heart rate has improved and is currently in the 60's to 70's. She was scheduled to get Cardizem CD 360 mg this morning and will adjust to short-acting Cardizem 90mg  Q6H with hold parameters given her variable BP. If HR and BP stable today, would anticipate switching back to Cardizem CD 360mg  daily tomorrow. Will need to determine after that if Lopressor 25mg  BID will need to be added back to her regimen.  - continue Eliquis for anticoagulation. Dosing currently reduced to 2.5mg  BID due to her age and AKI.   2. Mitral Stenosis - Mild by echocardiogram in 11/2018 with limited views by repeat echo. Continue to follow as an outpatient.   3. AKI - baseline creatinine 0.8 - 0.9. Elevated to 2.01 on admission. Renal US showing no acute findings. She has been started on IVF by the admitting team. Follow fluid status closely as she was just recently treated for a CHF exacerbation.  4. Hyperkalemia - K+ 6.7 on admission but improved to 4.9 on recheck following administration of IV calcium gluconate, 1 amp Bicarb, Insulin and D50.  5. Nausea/Diarrhea - thought to be secondary to viral GI illness. Further evaluation per admitting team.      For questions or updates, please contact [place] Please  consult [email or web address] for contact info under Cardiology/STEMI.  Signed, Erma Heritage, PA-C 04/16/2019, 9:41 AM Pager: [phone]  Attending note  Patient seen and discussed with PA Ahmed Prima, I agree with her documentation above. 85 yo female just discharged a few days ago. During recent admission issues with afib with RVR, dilt was titrated and started on lopressor for rate control. Admitted today with bradycardia in setting of N/V/D, hypotension, hyperkalemia. Significant AKI on admission with Cr to 2 from baseline of 0.8.   Loletha Grayer has resolved, agree with short acting dilt for the time being given lability with rates and bp's, hold home lopressor for now.   Carlyle Dolly MD

## 2019-04-10 NOTE — ED Notes (Signed)
Magda Paganini from poison control called to check on pt- updated on pt status and recent labs- reports she will call back later to check on pt -

## 2019-04-10 NOTE — H&P (Addendum)
TRH H&P    Patient Demographics:    Kristy Mckinney, is a 85 y.o. female  MRN: [id number]  DOB - 09/05/1931  Admit Date - 04/10/2019  Referring MD/NP/PA:  Rolland Porter  Outpatient Primary MD for the patient is [name], MD  Patient coming from:  home  Chief complaint- hypotension, bradycardia   HPI:    Kristy Mckinney  is a 85 y.o. female, w persistent atrial fibrillation , mitral stenosis , CHF (diastolic), and chronic back pain, w recent admission where cardizem was increased from 240 to 360mg  po qday, and  Started on metoprolol 25mg  po bid, apparently presents with c/o n/v starting yesterday.   Pt was noted to be hypotensive by EMS with sbp 50's, and given Ns 558mL iv x1  In ED,  T afebrile P 51, R 22, Bp 71/46  Pox 94% on RA Wt 62.1kg  Na 132, K 6.7, Bun 36, Creatinine 2.01 Wbc 10.3, Hgb 11.6, Plt 273  EKG- Afib at 46, LAD, Q in v1-3  Pt will be admitted for bradycardia, hypotension, and acute renal failure w hyperkalemia.    Review of systems:    In addition to the HPI above,  No Fever-chills, No Headache, No changes with Vision or hearing, No problems swallowing food or Liquids, No Chest pain, Cough or Shortness of Breath, No Abdominal pain, bowel movements are regular, No Blood in stool or Urine, No dysuria, No new skin rashes or bruises, No new joints pains-aches,  No new weakness, tingling, numbness in any extremity, No recent weight gain or loss, No polyuria, polydypsia or polyphagia, No significant Mental Stressors.  All other systems reviewed and are negative.    Past History of the following :    Past Medical History:  Diagnosis Date  . Atrial fibrillation ([place])   . Chronic back pain   . Diarrhea   . Dysphagia 05/03/2016  . GERD (gastroesophageal reflux disease)       Past Surgical History:  Procedure Laterality Date  . ABDOMINAL HYSTERECTOMY    . BACK  SURGERY    . CHOLECYSTECTOMY N/A 01/29/2014   Procedure: LAPAROSCOPIC CHOLECYSTECTOMY;  Surgeon: Scherry Ran, MD;  Location: [facility];  Service: General;  Laterality: N/A;  . COLONOSCOPY N/A 10/27/2015   Procedure: COLONOSCOPY;  Surgeon: Rogene Houston, MD;  Location: [facility];  Service: Endoscopy;  Laterality: N/A;  215  . INTRAMEDULLARY (IM) NAIL INTERTROCHANTERIC Right 11/29/2018   Procedure: INTRAMEDULLARY (IM) NAIL INTERTROCHANTRIC FRACTURE;  Surgeon: Renette Butters, MD;  Location: [facility];  Service: Orthopedics;  Laterality: Right;  . TONSILLECTOMY    . YAG LASER APPLICATION Left [id number]   Procedure: YAG LASER APPLICATION;  Surgeon: Elta Guadeloupe T. Gershon Crane, MD;  Location: [facility];  Service: Ophthalmology;  Laterality: Left;  left      Social History:      Social History   Tobacco Use  . Smoking status: Never Smoker  . Smokeless tobacco: Never Used  Substance Use Topics  . Alcohol use: No  Family History :     Family History  Problem Relation Age of Onset  . Hypertension Child        Home Medications:   Prior to Admission medications   Medication Sig Start Date End Date Taking? Authorizing Provider  acetaminophen (TYLENOL) 325 MG tablet Take 650 mg by mouth every 6 (six) hours as needed. 12/05/18   [provider]  alendronate (FOSAMAX) 70 MG tablet Take 1 tablet (70 mg total) by mouth once a week. Take with a full glass of water on an empty stomach. 12/23/18   Gerlene Fee, NP  apixaban (ELIQUIS) 5 MG TABS tablet Take 1 tablet (5 mg total) by mouth 2 (two) times daily. 04/07/19   [name], MD  Balsam [place] [employer] [facility]) OINT Apply topically to sacrum and bilateral buttocks every shift and as needed for prevention 12/06/18   [provider]  calcium-vitamin D (OS-CAL 500 + D) 500-200 MG-UNIT tablet Take 1 tablet by mouth 2 (two) times daily. Patient not taking: Reported on 02/28/2019 12/05/18   Desiree Hane, MD  diltiazem  (CARDIZEM CD) 360 MG 24 hr capsule Take 1 capsule (360 mg total) by mouth daily. 04/07/19   [name], MD  diphenoxylate-atropine (LOMOTIL) 2.5-0.025 MG tablet Take 1 tablet by mouth 4 (four) times daily as needed for diarrhea or loose stools. Patient not taking: Reported on 02/28/2019 12/23/18   Gerlene Fee, NP  docusate sodium (COLACE) 100 MG capsule Take 1 capsule (100 mg total) by mouth 2 (two) times daily. Patient not taking: Reported on 02/28/2019 12/05/18   Oretha Milch D, MD  feeding supplement, ENSURE ENLIVE, (ENSURE ENLIVE) LIQD Take 237 mLs by mouth 2 (two) times daily between meals. Patient not taking: Reported on 02/28/2019 12/05/18   Oretha Milch D, MD  magnesium oxide (MAG-OX) 400 MG tablet Take 1 tablet (400 mg total) by mouth 3 (three) times daily. 12/23/18   Gerlene Fee, NP  memantine (NAMENDA) 5 MG tablet Take 1 tablet (5 mg total) by mouth 2 (two) times daily. 12/23/18   Gerlene Fee, NP  metoprolol tartrate (LOPRESSOR) 25 MG tablet Take 1 tablet (25 mg total) by mouth 2 (two) times daily. 04/08/19   [name], MD  Multiple Vitamin (MULTIVITAMIN WITH MINERALS) TABS tablet Take 1 tablet by mouth daily. 12/05/18   Desiree Hane, MD  NON FORMULARY Diet Type: NAS 12/05/18   [provider]  omeprazole (PRILOSEC) 20 MG capsule Take 1 capsule (20 mg total) by mouth every morning. 12/05/18   Desiree Hane, MD  Ostomy Supplies (SKIN PREP WIPES) MISC Apply to bilateral heels every shift for prevention 12/06/18   [provider]  Polyethyl Glycol-Propyl Glycol (SYSTANE OP) Place 1 drop into both eyes daily as needed. Dry Eyes     [provider]  polyethylene glycol (MIRALAX / GLYCOLAX) 17 g packet Take 17 g by mouth daily as needed for mild constipation. Patient not taking: Reported on 02/28/2019 12/05/18   Oretha Milch D, MD  QUEtiapine (SEROQUEL) 25 MG tablet Take 1 tablet (25 mg total) by mouth at bedtime. 12/23/18   Gerlene Fee, NP   sertraline (ZOLOFT) 50 MG tablet Take 1 tablet (50 mg total) by mouth every morning. 12/23/18   Gerlene Fee, NP     Allergies:     Allergies  Allergen Reactions  . Penicillins Anaphylaxis  . Emetrol Nausea Only    swelling  . Feldene [Piroxicam] Nausea And Vomiting  .  Sulfa Antibiotics     swelling     Physical Exam:   Vitals  Blood pressure (!) 95/58, pulse 71, resp. rate 16, weight 62.1 kg, SpO2 97 %.  1.  General: axoxo3 (person, hospital, year)  2. Psychiatric: euthymic  3. Neurologic: Nonfocal, cn2-12 intact, reflexes 2+ symmetric, diffuse with no clonus, motor 5/5 in all 4 ext  4. HEENMT:  Anicteric, pupils 1.28mm symmetric, direct , consensual near intact Neck: no jvd  5. Respiratory : CTAB  6. Cardiovascular : Irr, irr,  s1, s2,   7. Gastrointestinal:  Abd: soft, nt, nd, +bs  8. Skin:  Ext: no c/c/e  9.Musculoskeletal:  Good ROM    Data Review:    CBC Recent Labs  Lab 04/04/19 0714 04/05/19 0632 04/10/19 0104  WBC 5.3 6.0 10.3  HGB 10.8* 11.6* 11.6*  HCT 34.9* 37.7 39.0  PLT 198 256 273  MCV 86.0 85.1 88.2  MCH 26.6 26.2 26.2  MCHC 30.9 30.8 29.7*  RDW 17.1* 16.8* 16.1*   ------------------------------------------------------------------------------------------------------------------  Results for orders placed or performed during the hospital encounter of 04/10/19 (from the past 48 hour(s))  CBG monitoring, ED     Status: Abnormal   Collection Time: 04/10/19 12:51 AM  Result Value Ref Range   Glucose-Capillary 153 (H) 70 - 99 mg/dL  Basic metabolic panel     Status: Abnormal   Collection Time: 04/10/19  1:04 AM  Result Value Ref Range   Sodium 132 (L) 135 - 145 mmol/L   Potassium 6.7 (HH) 3.5 - 5.1 mmol/L    Comment: REPEATED TO VERIFY CRITICAL RESULT CALLED TO, READ BACK BY AND VERIFIED WITH: B CRABTREE,RN @0155  04/10/19 MKELLY    Chloride 99 98 - 111 mmol/L   CO2 23 22 - 32 mmol/L   Glucose, Bld 164 (H) 70 - 99 mg/dL    BUN 36 (H) 8 - 23 mg/dL   Creatinine, Ser 2.01 (H) 0.44 - 1.00 mg/dL   Calcium 8.6 (L) 8.9 - 10.3 mg/dL   GFR calc non Af Amer 22 (L) >60 mL/min   GFR calc Af Amer 26 (L) >60 mL/min   Anion gap 10 5 - 15    Comment: Performed at [facility], [address] [place], [place] [address]  CBC     Status: Abnormal   Collection Time: 04/10/19  1:04 AM  Result Value Ref Range   WBC 10.3 4.0 - 10.5 K/uL   RBC 4.42 3.87 - 5.11 MIL/uL   Hemoglobin 11.6 (L) 12.0 - 15.0 g/dL   HCT 39.0 36.0 - 46.0 %   MCV 88.2 80.0 - 100.0 fL   MCH 26.2 26.0 - 34.0 pg   MCHC 29.7 (L) 30.0 - 36.0 g/dL   RDW 16.1 (H) 11.5 - 15.5 %   Platelets 273 150 - 400 K/uL   nRBC 0.0 0.0 - 0.2 %    Comment: Performed at [facility], [address] [place], [place] [address]    Chemistries  Recent Labs  Lab 04/04/19 [phone] 04/05/19 [phone] 04/10/19 0104  NA 139 140 132*  K 3.7 3.5 6.7*  CL 105 100 99  CO2 24 30 23   GLUCOSE 119* 101* 164*  BUN 19 20 36*  CREATININE 0.72 0.85 2.01*  CALCIUM 9.2 9.4 8.6*  MG  --  1.7  --    ------------------------------------------------------------------------------------------------------------------  ------------------------------------------------------------------------------------------------------------------ GFR: Estimated Creatinine Clearance: 18 mL/min (A) (by C-G formula based on SCr of 2.01 mg/dL (H)). Liver Function Tests: No results for  input(s): AST, ALT, ALKPHOS, BILITOT, PROT, ALBUMIN in the last 168 hours. No results for input(s): LIPASE, AMYLASE in the last 168 hours. No results for input(s): AMMONIA in the last 168 hours. Coagulation Profile: No results for input(s): INR, PROTIME in the last 168 hours. Cardiac Enzymes: No results for input(s): CKTOTAL, CKMB, CKMBINDEX, TROPONINI in the last 168 hours. BNP (last 3 results) No results for input(s): PROBNP in the last 8760 hours. HbA1C: No results for input(s): HGBA1C in the last 72 hours. CBG: Recent Labs   Lab 04/04/19 0728 04/10/19 0051  GLUCAP 102* 153*   Lipid Profile: No results for input(s): CHOL, HDL, LDLCALC, TRIG, CHOLHDL, LDLDIRECT in the last 72 hours. Thyroid Function Tests: No results for input(s): TSH, T4TOTAL, FREET4, T3FREE, THYROIDAB in the last 72 hours. Anemia Panel: No results for input(s): VITAMINB12, FOLATE, FERRITIN, TIBC, IRON, RETICCTPCT in the last 72 hours.  --------------------------------------------------------------------------------------------------------------- Urine analysis: No results found for: COLORURINE, APPEARANCEUR, LABSPEC, PHURINE, GLUCOSEU, HGBUR, BILIRUBINUR, KETONESUR, PROTEINUR, UROBILINOGEN, NITRITE, LEUKOCYTESUR    Imaging Results:    No results found.   Assessment & Plan:    Principal Problem:   Bradycardia Active Problems:   Dementia (HCC)   A-fib (HCC)   Hypotension   Hyperkalemia   ARF (acute renal failure) (HCC)  Hyperkalemia Sodium bicarb 1amp iv x1 Calcium gluconate 1 gm iv x1 Kayexalate 30gm po x1 Check cmp this morning  N/v zofran 4mg  iv q6h prn   ARF secondary to n/v Check urine sodium, urine prot/creatinine, urine eosinophils Check renal ultrasound Check spep, immunofixation Check cmp tomorrow am  Bradycardia Tele Trop I q2h x2 TSH on prior admission wnl STOP Metoprolol Cont Cardizem CD 360mg  po qday w holding parameters  Hypotension Hydrate with ns iv gently due to hx of diastolic CHF  Afib w recent episode of RVR 04/04/2019 Cont Cardizem CD 360mg  po qday Hold Metoprolol 25mg  po bid due to bradycardia above Cont Eliquis pharmacy to dose  Hyponatremia Ns iv at 62mL /hr  Check cmp tomorrow am   Dementia Cont Namenda  Gerd  Cont PPI  Osteoporosis Hold Fosamax,  Can Resume on discharge  DVT Prophylaxis-   Eliquis - SCDs  AM Labs Ordered, also please review Full Orders  Family Communication: Admission, patients condition and plan of care including tests being ordered have been  discussed with the patient  who indicate understanding and agree with the plan and Code Status.  Code Status:  FULL CODE per patient ,   Admission status: Inpatient: Based on patients clinical presentation and evaluation of above clinical data, I have made determination that patient meets Inpatient criteria at this time. Pt has bradycardia, hypotension, and hyperkalemia,  Pt will require iv fluids for arf, and iv medication for hypekalemi K=6.7, pt has high risk for clinical decline,  Pt will require > 2 nites stay.    Time spent in minutes : 70   Jani Gravel M.D on 04/10/2019 at 3:37 AM

## 2019-04-10 NOTE — ED Notes (Signed)
Poison Control called and given update.

## 2019-04-10 NOTE — ED Provider Notes (Signed)
[facility] EMERGENCY DEPARTMENT Provider Note   CSN: [id number] Arrival date & time: 04/10/19  0031   Time seen 1:25 AM  History   Chief Complaint Chief Complaint  Patient presents with   Weakness   Level 5 caveat for dementia  HPI Kristy Mckinney is a 85 y.o. female.     HPI patient was recently discharged from the hospital when she had been admitted for atrial fibrillation with fast ventricular response.  She is on Eliquis and they changed the dose of her diltiazem CD to 360 mg daily, and she was started on Lopressor 25 mg twice a day.  Patient states about 2:00 today she started getting very nauseated and she started having some vomiting around 6 PM.  She states she is extremely weak.  She states she is unable to get up.  She denies chest pain, shortness of breath, or abdominal pain.  EMS report her blood pressure was 50 systolic.  They gave her a 500 cc bolus prior to arriving in the ED.  PCP [name], MD   Past Medical History:  Diagnosis Date   Atrial fibrillation [facility])    Chronic back pain    Diarrhea    Dysphagia 05/03/2016   GERD (gastroesophageal reflux disease)     Patient Active Problem List   Diagnosis Date Noted   Bradycardia 04/10/2019   Hypotension 04/10/2019   Hyperkalemia 04/10/2019   ARF (acute renal failure) ([place]) 04/10/2019   Atrial fibrillation ([place]) 04/06/2019   A-fib ([place]) 04/04/2019   Dementia ([place]) 12/12/2018   GERD without esophagitis 12/06/2018   Post-menopausal osteoporosis 12/06/2018   Acute delirium 12/06/2018   Hypomagnesemia 12/06/2018   Acute blood loss anemia 12/06/2018   Closed fracture of right femur, unspecified fracture morphology, initial encounter ([place]) [id number]   Acute diastolic CHF (congestive heart failure) ([place]) 11/28/2018   Fracture, intertrochanteric, right femur ([place]) 11/28/2018   Atrial fibrillation with rapid ventricular response ([place]) 11/25/2018   Atrial fibrillation with RVR  ([place]) 11/24/2018   Chronic back pain 11/24/2018   Anxiety and depression 11/24/2018   Chronic constipation 05/03/2016   Dysphagia 05/03/2016   Cholecystitis with cholelithiasis 01/29/2014    Past Surgical History:  Procedure Laterality Date   ABDOMINAL HYSTERECTOMY     BACK SURGERY     CHOLECYSTECTOMY N/A 01/29/2014   Procedure: LAPAROSCOPIC CHOLECYSTECTOMY;  Surgeon: Scherry Ran, MD;  Location: [facility] ORS;  Service: General;  Laterality: N/A;   COLONOSCOPY N/A 10/27/2015   Procedure: COLONOSCOPY;  Surgeon: Rogene Houston, MD;  Location: [facility] ENDO SUITE;  Service: Endoscopy;  Laterality: N/A;  215   INTRAMEDULLARY (IM) NAIL INTERTROCHANTERIC Right 11/29/2018   Procedure: INTRAMEDULLARY (IM) NAIL INTERTROCHANTRIC FRACTURE;  Surgeon: Renette Butters, MD;  Location: [place];  Service: Orthopedics;  Laterality: Right;   TONSILLECTOMY     YAG LASER APPLICATION Left [id number]   Procedure: YAG LASER APPLICATION;  Surgeon: Elta Guadeloupe T. Gershon Crane, MD;  Location: [facility] ORS;  Service: Ophthalmology;  Laterality: Left;  left     OB History   No obstetric history on file.      Home Medications    Prior to Admission medications   Medication Sig Start Date End Date Taking? Authorizing Provider  acetaminophen (TYLENOL) 325 MG tablet Take 650 mg by mouth every 6 (six) hours as needed. 12/05/18   [provider]  alendronate (FOSAMAX) 70 MG tablet Take 1 tablet (70 mg total) by mouth once a week. Take with a full glass of water  on an empty stomach. 12/23/18   Gerlene Fee, NP  apixaban (ELIQUIS) 5 MG TABS tablet Take 1 tablet (5 mg total) by mouth 2 (two) times daily. 04/07/19   [name], MD  Balsam [place]-Castor Oil [facility]) OINT Apply topically to sacrum and bilateral buttocks every shift and as needed for prevention 12/06/18   [provider]  calcium-vitamin D (OS-CAL 500 + D) 500-200 MG-UNIT tablet Take 1 tablet by mouth 2 (two) times daily. Patient not taking:  Reported on 02/28/2019 12/05/18   Desiree Hane, MD  diltiazem (CARDIZEM CD) 360 MG 24 hr capsule Take 1 capsule (360 mg total) by mouth daily. 04/07/19   [name], MD  diphenoxylate-atropine (LOMOTIL) 2.5-0.025 MG tablet Take 1 tablet by mouth 4 (four) times daily as needed for diarrhea or loose stools. Patient not taking: Reported on 02/28/2019 12/23/18   Gerlene Fee, NP  docusate sodium (COLACE) 100 MG capsule Take 1 capsule (100 mg total) by mouth 2 (two) times daily. Patient not taking: Reported on 02/28/2019 12/05/18   Oretha Milch D, MD  feeding supplement, ENSURE ENLIVE, (ENSURE ENLIVE) LIQD Take 237 mLs by mouth 2 (two) times daily between meals. Patient not taking: Reported on 02/28/2019 12/05/18   Oretha Milch D, MD  magnesium oxide (MAG-OX) 400 MG tablet Take 1 tablet (400 mg total) by mouth 3 (three) times daily. 12/23/18   Gerlene Fee, NP  memantine (NAMENDA) 5 MG tablet Take 1 tablet (5 mg total) by mouth 2 (two) times daily. 12/23/18   Gerlene Fee, NP  metoprolol tartrate (LOPRESSOR) 25 MG tablet Take 1 tablet (25 mg total) by mouth 2 (two) times daily. 04/08/19   [name], MD  Multiple Vitamin (MULTIVITAMIN WITH MINERALS) TABS tablet Take 1 tablet by mouth daily. 12/05/18   Desiree Hane, MD  NON FORMULARY Diet Type: NAS 12/05/18   [provider]  omeprazole (PRILOSEC) 20 MG capsule Take 1 capsule (20 mg total) by mouth every morning. 12/05/18   Desiree Hane, MD  Ostomy Supplies (SKIN PREP WIPES) MISC Apply to bilateral heels every shift for prevention 12/06/18   [provider]  Polyethyl Glycol-Propyl Glycol (SYSTANE OP) Place 1 drop into both eyes daily as needed. Dry Eyes     [provider]  polyethylene glycol (MIRALAX / GLYCOLAX) 17 g packet Take 17 g by mouth daily as needed for mild constipation. Patient not taking: Reported on 02/28/2019 12/05/18   Oretha Milch D, MD  QUEtiapine (SEROQUEL) 25 MG tablet Take 1 tablet  (25 mg total) by mouth at bedtime. 12/23/18   Gerlene Fee, NP  sertraline (ZOLOFT) 50 MG tablet Take 1 tablet (50 mg total) by mouth every morning. 12/23/18   Gerlene Fee, NP    Family History Family History  Problem Relation Age of Onset   Hypertension Child     Social History Social History   Tobacco Use   Smoking status: Never Smoker   Smokeless tobacco: Never Used  Substance Use Topics   Alcohol use: No   Drug use: No  lives at home Lives alone   Allergies   Penicillins, Emetrol, Feldene [piroxicam], and Sulfa antibiotics   Review of Systems Review of Systems  All other systems reviewed and are negative.    Physical Exam Updated Vital Signs BP (!) 105/48    Pulse 61    Resp 18    Wt 62.1 kg    SpO2 100%    BMI  23.52 kg/m   Physical Exam Vitals signs and nursing note reviewed.  Constitutional:      General: She is not in acute distress.    Appearance: Normal appearance. She is well-developed. She is not ill-appearing or toxic-appearing.  HENT:     Head: Normocephalic and atraumatic.     Right Ear: External ear normal.     Left Ear: External ear normal.     Nose: Nose normal. No mucosal edema or rhinorrhea.     Mouth/Throat:     Dentition: No dental abscesses.     Pharynx: No uvula swelling.  Eyes:     Conjunctiva/sclera: Conjunctivae normal.     Pupils: Pupils are equal, round, and reactive to light.  Neck:     Musculoskeletal: Full passive range of motion without pain, normal range of motion and neck supple.  Cardiovascular:     Rate and Rhythm: Bradycardia present. Rhythm irregular.     Heart sounds: Normal heart sounds. No murmur. No friction rub. No gallop.   Pulmonary:     Effort: Pulmonary effort is normal. No respiratory distress.     Breath sounds: Normal breath sounds. No wheezing, rhonchi or rales.  Chest:     Chest wall: No tenderness or crepitus.  Abdominal:     General: Bowel sounds are normal. There is no distension.      Palpations: Abdomen is soft.     Tenderness: There is no abdominal tenderness. There is no guarding or rebound.  Musculoskeletal: Normal range of motion.        General: No tenderness.     Comments: Moves all extremities well.   Skin:    General: Skin is warm and dry.     Coloration: Skin is pale.     Findings: No erythema or rash.  Neurological:     General: No focal deficit present.     Mental Status: She is alert and oriented to person, place, and time.     Cranial Nerves: No cranial nerve deficit.  Psychiatric:        Mood and Affect: Mood normal. Mood is not anxious.        Speech: Speech normal.        Behavior: Behavior normal.        Thought Content: Thought content normal.      ED Treatments / Results  Labs (all labs ordered are listed, but only abnormal results are displayed) Results for orders placed or performed during the hospital encounter of [id number]  Basic metabolic panel  Result Value Ref Range   Sodium 132 (L) 135 - 145 mmol/L   Potassium 6.7 (HH) 3.5 - 5.1 mmol/L   Chloride 99 98 - 111 mmol/L   CO2 23 22 - 32 mmol/L   Glucose, Bld 164 (H) 70 - 99 mg/dL   BUN 36 (H) 8 - 23 mg/dL   Creatinine, Ser 2.01 (H) 0.44 - 1.00 mg/dL   Calcium 8.6 (L) 8.9 - 10.3 mg/dL   GFR calc non Af Amer 22 (L) >60 mL/min   GFR calc Af Amer 26 (L) >60 mL/min   Anion gap 10 5 - 15  CBC  Result Value Ref Range   WBC 10.3 4.0 - 10.5 K/uL   RBC 4.42 3.87 - 5.11 MIL/uL   Hemoglobin 11.6 (L) 12.0 - 15.0 g/dL   HCT 39.0 36.0 - 46.0 %   MCV 88.2 80.0 - 100.0 fL   MCH 26.2 26.0 - 34.0 pg   MCHC 29.7 (  L) 30.0 - 36.0 g/dL   RDW 16.1 (H) 11.5 - 15.5 %   Platelets 273 150 - 400 K/uL   nRBC 0.0 0.0 - 0.2 %  CBG monitoring, ED  Result Value Ref Range   Glucose-Capillary 153 (H) 70 - 99 mg/dL   Laboratory interpretation all normal except new renal insufficiency with hyperkalemia without hemolysis, mild anemia, marked elevation of BUN consistent with dehydration    EKG EKG  Interpretation  Date/Time:  Thursday April 10 2019 00:46:46 EDT Ventricular Rate:  46 PR Interval:    QRS Duration: 108 QT Interval:  491 QTC Calculation: 430 R Axis:   -54 Text Interpretation:  Atrial fibrillation bradycardia Inferior infarct, old Anteroseptal infarct, age indeterminate Since last tracing rate slower 04 Apr 2019 Confirmed by Rolland Porter [phone]) on 04/10/2019 1:23:55 AM  #2    EKG Interpretation  Date/Time:  Thursday April 10 2019 04:24:04 EDT Ventricular Rate:  67 PR Interval:    QRS Duration: 95 QT Interval:  432 QTC Calculation: 457 R Axis:   -47 Text Interpretation:  Atrial fibrillation Left anterior fascicular block Low voltage, precordial leads Probable anteroseptal infarct, old Since last tracing rate faster about 3 3/4 hrs before Confirmed by Rolland Porter [phone]) on 04/10/2019 4:30:52 AM        Radiology No results found.  Procedures .Critical Care Performed by: Rolland Porter, MD Authorized by: Rolland Porter, MD   Critical care provider statement:    Critical care time (minutes):  39   Critical care was necessary to treat or prevent imminent or life-threatening deterioration of the following conditions:  Circulatory failure   Critical care was time spent personally by me on the following activities:  Discussions with consultants, obtaining history from patient or surrogate, examination of patient, evaluation of patient's response to treatment, ordering and review of laboratory studies, ordering and review of radiographic studies, pulse oximetry, re-evaluation of patient's condition and review of old charts   (including critical care time)   Echocardiogram 04/04/2019 IMPRESSIONS  1. Left ventricular ejection fraction, by visual estimation, is 70 to 75%. The left ventricle has hyperdynamic function. Decreased left ventricular size. There is moderately increased left ventricular hypertrophy.  2. Left ventricular diastolic Doppler parameters are  indeterminate pattern of LV diastolic filling.  3. Right ventricular volume and pressure overload.  4. Global right ventricle has normal systolic function.The right ventricular size is moderately enlarged. Mildly increased right ventricular wall thickness.  5. Left atrial size was severely dilated.  6. Right atrial size was normal.  7. Presence of pericardial fat pad.  8. Moderate aortic valve annular calcification.  9. Moderate to severe mitral annular calcification. 10. Severe calcification of the mitral valve leaflet(s). Valve appears stenotic, not completely evaluated. Suggest follow-up images with VTI measurements or planimetry if possible. 11. The mitral valve is degenerative. Mild mitral valve regurgitation. 12. The tricuspid valve is grossly normal. Tricuspid valve regurgitation is mild. 13. Aortic valve area, by VTI measures 1.83 cm. 14. Aortic valve mean gradient measures 8.8 mmHg. 15. The aortic valve is tricuspid Aortic valve regurgitation was not visualized by color flow Doppler. Mild aortic valve stenosis. 16. Moderately elevated pulmonary artery systolic pressure. 17. The tricuspid regurgitant velocity is 3.67 m/s, and with an assumed right atrial pressure of 8 mmHg, the estimated right ventricular systolic pressure is moderately elevated at 61.9 mmHg. 18. The inferior vena cava is normal in size with <50% respiratory variability, suggesting right atrial pressure of 8 mmHg. 19. The pulmonic valve  was grossly normal. Pulmonic valve regurgitation is trivial by color flow Doppler.   Medications Ordered in ED Medications  sodium chloride flush (NS) 0.9 % injection 3 mL (3 mLs Intravenous Not Given 04/10/19 0105)  sodium chloride 0.9 % bolus 500 mL (0 mLs Intravenous Stopped 04/10/19 0144)  ondansetron (ZOFRAN) injection 4 mg (4 mg Intravenous Given 04/10/19 0140)  insulin aspart (novoLOG) injection 5 Units (5 Units Intravenous Given 04/10/19 0308)  dextrose 50 % solution 50 mL (50  mLs Intravenous Given 04/10/19 0309)  sodium bicarbonate injection 50 mEq (50 mEq Intravenous Given 04/10/19 0313)  sodium chloride 0.9 % bolus 500 mL (0 mLs Intravenous Stopped 04/10/19 0350)  calcium gluconate 1 g/ 50 mL sodium chloride IVPB (0 g Intravenous Stopped 04/10/19 0313)  calcium gluconate in NaCl 1-0.675 GM/50ML-% IVPB (has no administration in time range)     Initial Impression / Assessment and Plan / ED Course  I have reviewed the triage vital signs and the nursing notes.  Pertinent labs & imaging results that were available during my care of the patient were reviewed by me and considered in my medical decision making (see chart for details).       When I was in the room to examine patient she was complaining of lots of nausea, she was given Zofran IV.  She was given a second bolus of 500 cc normal saline.  (EMS gave first bolus of 500 cc normal saline)  1:50 AM patient's blood pressure is 82/41, her initial was 71/46.  2:00 AM nursing staff report her potassium is elevated, I had them call lab and verify there was no hemolysis and there was not.  She was started on hyperkalemia protocol with IV calcium gluconate, 1 amp of bicarb, insulin and 1 amp of D50.  She was given a third 500 cc bolus of normal saline.  02:36 AM discussed with Poison Control, glucagon can cause nausea or vomiting, NE if need pressor, calcium should help with the calcium channel blocker that she is on.  Recheck at 3:40 AM patient's blood pressure is 104/51, heart rate is 68.  Final Clinical Impressions(s) / ED Diagnoses   Final diagnoses:  Bradycardia  Generalized weakness  Hypotension, unspecified hypotension type  Acute hyperkalemia  Non-intractable vomiting with nausea, unspecified vomiting type    Plan admission  Rolland Porter, MD, Barbette Or, MD 04/10/19 [phone]

## 2019-04-10 NOTE — Progress Notes (Signed)
She came to the hospital because of nausea and vomiting.  She was found to be dehydrated bradycardic and hyperkalemic.  She has been treated and says she feels better.  She is not complaining of nausea now.  Her heart rates about 70.  Exam shows she looks more comfortable than earlier description.  Her abdomen is soft.  Minimal tenderness.  Heart is irregularly irregular with rate of about 70.  Assessment: She had some sort of gastrointestinal illness and I do not think it is entirely clear what this was.  No one else in the family has been sick.  She is better.  She was clearly dehydrated and hyperkalemic.  Hyperkalemia has been treated and I think that has helped with her bradycardia  Plan: Continue treatments.  Continue IV fluids.  Request cardiology consultation for help with medications.

## 2019-04-11 LAB — COMPREHENSIVE METABOLIC PANEL
ALT: 85 U/L — ABNORMAL HIGH (ref 0–44)
AST: 84 U/L — ABNORMAL HIGH (ref 15–41)
Albumin: 3.3 g/dL — ABNORMAL LOW (ref 3.5–5.0)
Alkaline Phosphatase: 104 U/L (ref 38–126)
Anion gap: 9 (ref 5–15)
BUN: 40 mg/dL — ABNORMAL HIGH (ref 8–23)
CO2: 28 mmol/L (ref 22–32)
Calcium: 8.7 mg/dL — ABNORMAL LOW (ref 8.9–10.3)
Chloride: 100 mmol/L (ref 98–111)
Creatinine, Ser: 2 mg/dL — ABNORMAL HIGH (ref 0.44–1.00)
GFR calc Af Amer: 26 mL/min — ABNORMAL LOW (ref >60)
GFR calc non Af Amer: 22 mL/min — ABNORMAL LOW (ref >60)
Glucose, Bld: 104 mg/dL — ABNORMAL HIGH (ref 70–99)
Potassium: 4.1 mmol/L (ref 3.5–5.1)
Sodium: 137 mmol/L (ref 135–145)
Total Bilirubin: 0.6 mg/dL (ref 0.3–1.2)
Total Protein: 5.9 g/dL — ABNORMAL LOW (ref 6.5–8.1)

## 2019-04-11 LAB — CBC
HCT: 34.9 % — ABNORMAL LOW (ref 36.0–46.0)
Hemoglobin: 10.2 g/dL — ABNORMAL LOW (ref 12.0–15.0)
MCH: 26 pg (ref 26.0–34.0)
MCHC: 29.2 g/dL — ABNORMAL LOW (ref 30.0–36.0)
MCV: 88.8 fL (ref 80.0–100.0)
Platelets: 209 10*3/uL (ref 150–400)
RBC: 3.93 MIL/uL (ref 3.87–5.11)
RDW: 16.3 % — ABNORMAL HIGH (ref 11.5–15.5)
WBC: 7.8 10*3/uL (ref 4.0–10.5)
nRBC: 0 % (ref 0.0–0.2)

## 2019-04-11 MED ORDER — GUAIFENESIN-DM 100-10 MG/5ML PO SYRP
5.0000 mL | ORAL_SOLUTION | ORAL | Status: DC | PRN
  Administered 2019-04-11 – 2019-04-14 (×3): 5 mL via ORAL
  Filled 2019-04-11 (×3): qty 5

## 2019-04-11 MED ORDER — POTASSIUM CHLORIDE CRYS ER 20 MEQ PO TBCR: 40.0000 meq | EXTENDED_RELEASE_TABLET | Freq: Once | ORAL | Status: DC

## 2019-04-11 NOTE — TOC Initial Note (Signed)
Transition of Care [facility]) - Initial/Assessment Note    Patient Details  Name: Kristy Mckinney MRN: [id number] Date of Birth: 20-Nov-1931  Transition of Care [facility]) CM/SW Contact:    Ihor Gully, LCSW Phone Number: 04/11/2019, 2:16 PM  Clinical Narrative:                 Ms Mccalman was seen due to high risk for readmission. Admitted for bradycardia. She lives at home alone, but is checked on daily, in person or on the phone, by her local daughters and son.  She has been doing all her ADL's, as well as taking care of her apartment and driving, though she admits that since d/c from [facility] where she went to rehab for a broken femur, her daughter is bringing her a daily meal when she stops by to check on her.  Furthermore, she has DME of cane, which she uses most frequently, a walker, shower chair and grab rails in her handicap accessible bathroom. Dr Luan Pulling is her PCP and she states all her medications are affordable.  TOC will continue to follow during the course of hospitalization.      [facility] referral made.    Expected Discharge Plan: [place] Barriers to Discharge: Continued Medical Work up   Patient Goals and CMS Choice Patient states their goals for this hospitalization and ongoing recovery are:: To return home and be well.      Expected Discharge Plan and Services Expected Discharge Plan: [place] arrangements for the past 2 months: Apartment                                      Prior Living Arrangements/Services Living arrangements for the past 2 months: Apartment Lives with:: Self Patient language and need for interpreter reviewed:: Yes Do you feel safe going back to the place where you live?: Yes      Need for Family Participation in Patient Care: Yes (Comment) Care giver support system in place?: Yes (comment) Current home services: DME Criminal Activity/Legal Involvement Pertinent to Current  Situation/Hospitalization: No - Comment as needed  Activities of Daily Living Home Assistive Devices/Equipment: Eyeglasses, Environmental consultant (specify type), Cane (specify quad or straight) ADL Screening (condition at time of admission) Patient's cognitive ability adequate to safely complete daily activities?: Yes Is the patient deaf or have difficulty hearing?: Yes Does the patient have difficulty seeing, even when wearing glasses/contacts?: No Does the patient have difficulty concentrating, remembering, or making decisions?: No Patient able to express need for assistance with ADLs?: Yes Does the patient have difficulty dressing or bathing?: No Independently performs ADLs?: Yes (appropriate for developmental age) Does the patient have difficulty walking or climbing stairs?: No Weakness of Legs: None Weakness of Arms/Hands: None  Permission Sought/Granted                  Emotional Assessment   Attitude/Demeanor/Rapport: Engaged Affect (typically observed): Appropriate Orientation: : Oriented to Self, Oriented to Place, Oriented to  Time, Oriented to Situation Alcohol / Substance Use: Not Applicable Psych Involvement: No (comment)  Admission diagnosis:  Acute hyperkalemia [E87.5] Bradycardia [R00.1] ARF (acute renal failure) (HCC) [N17.9] Generalized weakness [R53.1] Hypotension, unspecified hypotension type [I95.9] Non-intractable vomiting with nausea, unspecified vomiting type [R11.2] Patient Active Problem List   Diagnosis Date Noted  . Bradycardia 04/10/2019  . Hypotension  04/10/2019  . Hyperkalemia 04/10/2019  . ARF (acute renal failure) ([place]) 04/10/2019  . Atrial fibrillation ([place]) 04/06/2019  . A-fib ([place]) 04/04/2019  . Dementia ([place]) 12/12/2018  . GERD without esophagitis 12/06/2018  . Post-menopausal osteoporosis 12/06/2018  . Acute delirium 12/06/2018  . Hypomagnesemia 12/06/2018  . Acute blood loss anemia 12/06/2018  . Closed fracture of right femur, unspecified  fracture morphology, initial encounter ([place]) 11/28/2018  . Acute diastolic CHF (congestive heart failure) ([place]) 11/28/2018  . Fracture, intertrochanteric, right femur ([place]) 11/28/2018  . Atrial fibrillation with rapid ventricular response ([place]) 11/25/2018  . Atrial fibrillation with RVR ([place]) 11/24/2018  . Chronic back pain 11/24/2018  . Anxiety and depression 11/24/2018  . Chronic constipation 05/03/2016  . Dysphagia 05/03/2016  . Cholecystitis with cholelithiasis 01/29/2014   PCP:  [name], MD Pharmacy:   [place] [id number] [place] [id number] Phone: [phone] Fax: [phone]  [facility] - [address] [place], [place] [address] Phone: [phone] Fax: [phone]     Social Determinants of Health (SDOH) Interventions    Readmission Risk Interventions Readmission Risk Prevention Plan 11/27/2018  Post Dischage Appt Not Complete  Appt Comments cardiologist will call patient for appt.   Medication Screening Complete  Transportation Screening Complete  Some recent data might be hidden

## 2019-04-11 NOTE — Progress Notes (Signed)
Subjective: She says she feels better.  She has no new complaints.  Her breathing is doing okay.  She is not having any chest pain.  No more nausea or vomiting.  Her renal function has not returned to baseline yet.  Objective: Vital signs in last 24 hours: Temp:  [97.8 F (36.6 C)-98.2 F (36.8 C)] 98.1 F (36.7 C) (10/02 0749) Pulse Rate:  [25-112] 97 (10/02 0700) Resp:  [14-36] 20 (10/02 0700) BP: (86-122)/(50-74) 105/65 (10/02 0700) SpO2:  [88 %-100 %] 97 % (10/02 0700) Weight:  [62.1 kg-67.7 kg] 67.7 kg (10/02 0557) Weight change: 0 kg Last BM Date: 04/10/19  Intake/Output from previous day: 10/01 0701 - 10/02 0700 In: 884 [P.O.:120; I.V.:764] Out: -   PHYSICAL EXAM General appearance: alert, cooperative and no distress Resp: clear to auscultation bilaterally Cardio: irregularly irregular rhythm GI: soft, non-tender; bowel sounds normal; no masses,  no organomegaly Extremities: extremities normal, atraumatic, no cyanosis or edema  Lab Results:  Results for orders placed or performed during the hospital encounter of 04/10/19 (from the past 48 hour(s))  CBG monitoring, ED     Status: Abnormal   Collection Time: 04/10/19 12:51 AM  Result Value Ref Range   Glucose-Capillary 153 (H) 70 - 99 mg/dL  Basic metabolic panel     Status: Abnormal   Collection Time: 04/10/19  1:04 AM  Result Value Ref Range   Sodium 132 (L) 135 - 145 mmol/L   Potassium 6.7 (HH) 3.5 - 5.1 mmol/L    Comment: REPEATED TO VERIFY CRITICAL RESULT CALLED TO, READ BACK BY AND VERIFIED WITH: B CRABTREE,RN @0155  04/10/19 MKELLY    Chloride 99 98 - 111 mmol/L   CO2 23 22 - 32 mmol/L   Glucose, Bld 164 (H) 70 - 99 mg/dL   BUN 36 (H) 8 - 23 mg/dL   Creatinine, Ser 2.01 (H) 0.44 - 1.00 mg/dL   Calcium 8.6 (L) 8.9 - 10.3 mg/dL   GFR calc non Af Amer 22 (L) >60 mL/min   GFR calc Af Amer 26 (L) >60 mL/min   Anion gap 10 5 - 15    Comment: Performed at [facility], [address] [place], [place]  [address]  CBC     Status: Abnormal   Collection Time: 04/10/19  1:04 AM  Result Value Ref Range   WBC 10.3 4.0 - 10.5 K/uL   RBC 4.42 3.87 - 5.11 MIL/uL   Hemoglobin 11.6 (L) 12.0 - 15.0 g/dL   HCT 39.0 36.0 - 46.0 %   MCV 88.2 80.0 - 100.0 fL   MCH 26.2 26.0 - 34.0 pg   MCHC 29.7 (L) 30.0 - 36.0 g/dL   RDW 16.1 (H) 11.5 - 15.5 %   Platelets 273 150 - 400 K/uL   nRBC 0.0 0.0 - 0.2 %    Comment: Performed at [facility], [address] [place], [place] [address]  SARS CORONAVIRUS 2 (TAT 6-24 HRS) Nasopharyngeal Nasopharyngeal Swab     Status: None   Collection Time: 04/10/19  3:31 AM   Specimen: Nasopharyngeal Swab  Result Value Ref Range   SARS Coronavirus 2 NEGATIVE NEGATIVE    Comment: (NOTE) SARS-CoV-2 target nucleic acids are NOT DETECTED. The SARS-CoV-2 RNA is generally detectable in upper and lower respiratory specimens during the acute phase of infection. Negative results do not preclude SARS-CoV-2 infection, do not rule out co-infections with other pathogens, and should not be used as the sole basis for treatment or other patient management decisions.  Negative results must be combined with clinical observations, patient history, and epidemiological information. The expected result is Negative. Fact Sheet for Patients: SugarRoll.be Fact Sheet for Healthcare Providers: https://www.woods-mathews.com/ This test is not yet approved or cleared by the [place] FDA and  has been authorized for detection and/or diagnosis of SARS-CoV-2 by FDA under an Emergency Use Authorization (EUA). This EUA will remain  in effect (meaning this test can be used) for the duration of the COVID-19 declaration under Section 56 4(b)(1) of the Act, 21 U.S.C. section 360bbb-3(b)(1), unless the authorization is terminated or revoked sooner. Performed at [facility], [place] [address] [place], [place] [address]   Troponin I (High Sensitivity)      Status: None   Collection Time: 04/10/19  3:57 AM  Result Value Ref Range   Troponin I (High Sensitivity) 7 <18 ng/L    Comment: (NOTE) Elevated high sensitivity troponin I (hsTnI) values and significant  changes across serial measurements may suggest ACS but many other  chronic and acute conditions are known to elevate hsTnI results.  Refer to the "Links" section for chest pain algorithms and additional  guidance. Performed at [facility], [address], [place], [place] [address]   Comprehensive metabolic panel     Status: Abnormal   Collection Time: 04/10/19  3:57 AM  Result Value Ref Range   Sodium 137 135 - 145 mmol/L   Potassium 4.9 3.5 - 5.1 mmol/L    Comment: DELTA CHECK NOTED   Chloride 99 98 - 111 mmol/L   CO2 26 22 - 32 mmol/L   Glucose, Bld 225 (H) 70 - 99 mg/dL   BUN 35 (H) 8 - 23 mg/dL   Creatinine, Ser 2.06 (H) 0.44 - 1.00 mg/dL   Calcium 9.0 8.9 - 10.3 mg/dL   Total Protein 5.8 (L) 6.5 - 8.1 g/dL   Albumin 3.2 (L) 3.5 - 5.0 g/dL   AST 140 (H) 15 - 41 U/L   ALT 100 (H) 0 - 44 U/L   Alkaline Phosphatase 128 (H) 38 - 126 U/L   Total Bilirubin 2.4 (H) 0.3 - 1.2 mg/dL   GFR calc non Af Amer 22 (L) >60 mL/min   GFR calc Af Amer 25 (L) >60 mL/min   Anion gap 12 5 - 15    Comment: Performed at [facility], [address], [place], [place] [address]  Troponin I (High Sensitivity)     Status: None   Collection Time: 04/10/19  6:10 AM  Result Value Ref Range   Troponin I (High Sensitivity) 8 <18 ng/L    Comment: (NOTE) Elevated high sensitivity troponin I (hsTnI) values and significant  changes across serial measurements may suggest ACS but many other  chronic and acute conditions are known to elevate hsTnI results.  Refer to the "Links" section for chest pain algorithms and additional  guidance. Performed at [facility], [address], [place], [place] [address]   MRSA PCR Screening     Status: None   Collection Time: 04/10/19 12:00 PM   Specimen:  Nasopharyngeal  Result Value Ref Range   MRSA by PCR NEGATIVE NEGATIVE    Comment:        The GeneXpert MRSA Assay (FDA approved for NASAL specimens only), is one component of a comprehensive MRSA colonization surveillance program. It is not intended to diagnose MRSA infection nor to guide or monitor treatment for MRSA infections. Performed at [facility], [address] [place], [place] [address]  Comprehensive metabolic panel     Status: Abnormal   Collection Time: 04/11/19  3:56 AM  Result Value Ref Range   Sodium 137 135 - 145 mmol/L   Potassium 4.1 3.5 - 5.1 mmol/L   Chloride 100 98 - 111 mmol/L   CO2 28 22 - 32 mmol/L   Glucose, Bld 104 (H) 70 - 99 mg/dL   BUN 40 (H) 8 - 23 mg/dL   Creatinine, Ser 2.00 (H) 0.44 - 1.00 mg/dL   Calcium 8.7 (L) 8.9 - 10.3 mg/dL   Total Protein 5.9 (L) 6.5 - 8.1 g/dL   Albumin 3.3 (L) 3.5 - 5.0 g/dL   AST 84 (H) 15 - 41 U/L   ALT 85 (H) 0 - 44 U/L   Alkaline Phosphatase 104 38 - 126 U/L   Total Bilirubin 0.6 0.3 - 1.2 mg/dL   GFR calc non Af Amer 22 (L) >60 mL/min   GFR calc Af Amer 26 (L) >60 mL/min   Anion gap 9 5 - 15    Comment: Performed at [facility], [address], [place], [place] [address]  CBC     Status: Abnormal   Collection Time: 04/11/19  3:56 AM  Result Value Ref Range   WBC 7.8 4.0 - 10.5 K/uL   RBC 3.93 3.87 - 5.11 MIL/uL   Hemoglobin 10.2 (L) 12.0 - 15.0 g/dL   HCT 34.9 (L) 36.0 - 46.0 %   MCV 88.8 80.0 - 100.0 fL   MCH 26.0 26.0 - 34.0 pg   MCHC 29.2 (L) 30.0 - 36.0 g/dL   RDW 16.3 (H) 11.5 - 15.5 %   Platelets 209 150 - 400 K/uL   nRBC 0.0 0.0 - 0.2 %    Comment: Performed at [facility], [address], [place], [place] [address]    ABGS No results for input(s): PHART, PO2ART, TCO2, HCO3 in the last 72 hours.  Invalid input(s): PCO2 CULTURES Recent Results (from the past 240 hour(s))  SARS CORONAVIRUS 2 (TAT 6-24 HRS) Nasopharyngeal Nasopharyngeal Swab     Status: None   Collection Time: 04/04/19  11:12 AM   Specimen: Nasopharyngeal Swab  Result Value Ref Range Status   SARS Coronavirus 2 NEGATIVE NEGATIVE Final    Comment: (NOTE) SARS-CoV-2 target nucleic acids are NOT DETECTED. The SARS-CoV-2 RNA is generally detectable in upper and lower respiratory specimens during the acute phase of infection. Negative results do not preclude SARS-CoV-2 infection, do not rule out co-infections with other pathogens, and should not be used as the sole basis for treatment or other patient management decisions. Negative results must be combined with clinical observations, patient history, and epidemiological information. The expected result is Negative. Fact Sheet for Patients: SugarRoll.be Fact Sheet for Healthcare Providers: https://www.woods-mathews.com/ This test is not yet approved or cleared by the [place] FDA and  has been authorized for detection and/or diagnosis of SARS-CoV-2 by FDA under an Emergency Use Authorization (EUA). This EUA will remain  in effect (meaning this test can be used) for the duration of the COVID-19 declaration under Section 56 4(b)(1) of the Act, 21 U.S.C. section 360bbb-3(b)(1), unless the authorization is terminated or revoked sooner. Performed at [facility], [place] [address], [place], [place] [address]   SARS CORONAVIRUS 2 (TAT 6-24 HRS) Nasopharyngeal Nasopharyngeal Swab     Status: None   Collection Time: 04/10/19  3:31 AM   Specimen: Nasopharyngeal Swab  Result Value Ref Range Status   SARS Coronavirus 2 NEGATIVE NEGATIVE  Final    Comment: (NOTE) SARS-CoV-2 target nucleic acids are NOT DETECTED. The SARS-CoV-2 RNA is generally detectable in upper and lower respiratory specimens during the acute phase of infection. Negative results do not preclude SARS-CoV-2 infection, do not rule out co-infections with other pathogens, and should not be used as the sole basis for treatment or other patient  management decisions. Negative results must be combined with clinical observations, patient history, and epidemiological information. The expected result is Negative. Fact Sheet for Patients: SugarRoll.be Fact Sheet for Healthcare Providers: https://www.woods-mathews.com/ This test is not yet approved or cleared by the [place] FDA and  has been authorized for detection and/or diagnosis of SARS-CoV-2 by FDA under an Emergency Use Authorization (EUA). This EUA will remain  in effect (meaning this test can be used) for the duration of the COVID-19 declaration under Section 56 4(b)(1) of the Act, 21 U.S.C. section 360bbb-3(b)(1), unless the authorization is terminated or revoked sooner. Performed at [facility], [place] [address], [place], [place] [address]   MRSA PCR Screening     Status: None   Collection Time: 04/10/19 12:00 PM   Specimen: Nasopharyngeal  Result Value Ref Range Status   MRSA by PCR NEGATIVE NEGATIVE Final    Comment:        The GeneXpert MRSA Assay (FDA approved for NASAL specimens only), is one component of a comprehensive MRSA colonization surveillance program. It is not intended to diagnose MRSA infection nor to guide or monitor treatment for MRSA infections. Performed at [facility], [address], [place], [place] [address]    Studies/Results: US Renal  Result Date: 04/10/2019 CLINICAL DATA:  Acute renal failure EXAM: RENAL / URINARY TRACT ULTRASOUND COMPLETE COMPARISON:  05/11/2015 FINDINGS: Right Kidney: Renal measurements: 10.5 x 4.8 x 5.4 cm. = volume: 143 mL . Echogenicity within normal limits. No mass or hydronephrosis visualized. Left Kidney: Renal measurements: 11.4 x 4.7 x 6.3 cm. = volume: 177 mL. Echogenicity within normal limits. No mass or hydronephrosis visualized. Bladder: Decompressed IMPRESSION: Normal appearing renal ultrasound. Electronically Signed   By: Inez Catalina M.D.   On:  04/10/2019 09:08    Medications:  Prior to Admission:  Medications Prior to Admission  Medication Sig Dispense Refill Last Dose  . acetaminophen (TYLENOL) 325 MG tablet Take 650 mg by mouth every 6 (six) hours as needed.     Marland Kitchen alendronate (FOSAMAX) 70 MG tablet Take 1 tablet (70 mg total) by mouth once a week. Take with a full glass of water on an empty stomach. 4 tablet 0   . apixaban (ELIQUIS) 5 MG TABS tablet Take 1 tablet (5 mg total) by mouth 2 (two) times daily. 60 tablet 5   . Balsam [place]-Castor Oil (VENELEX) OINT Apply topically to sacrum and bilateral buttocks every shift and as needed for prevention     . calcium-vitamin D (OS-CAL 500 + D) 500-200 MG-UNIT tablet Take 1 tablet by mouth 2 (two) times daily. (Patient not taking: Reported on 02/28/2019)     . diltiazem (CARDIZEM CD) 360 MG 24 hr capsule Take 1 capsule (360 mg total) by mouth daily. 30 capsule 5   . diphenoxylate-atropine (LOMOTIL) 2.5-0.025 MG tablet Take 1 tablet by mouth 4 (four) times daily as needed for diarrhea or loose stools. (Patient not taking: Reported on 02/28/2019) 10 tablet 0   . docusate sodium (COLACE) 100 MG capsule Take 1 capsule (100 mg total) by mouth 2 (two) times daily. (Patient not taking: Reported on 02/28/2019)  10 capsule 0   . feeding supplement, ENSURE ENLIVE, (ENSURE ENLIVE) LIQD Take 237 mLs by mouth 2 (two) times daily between meals. (Patient not taking: Reported on 02/28/2019) 237 mL 12   . magnesium oxide (MAG-OX) 400 MG tablet Take 1 tablet (400 mg total) by mouth 3 (three) times daily. 90 tablet 0   . memantine (NAMENDA) 5 MG tablet Take 1 tablet (5 mg total) by mouth 2 (two) times daily. 60 tablet 0   . metoprolol tartrate (LOPRESSOR) 25 MG tablet Take 1 tablet (25 mg total) by mouth 2 (two) times daily. 60 tablet 5   . Multiple Vitamin (MULTIVITAMIN WITH MINERALS) TABS tablet Take 1 tablet by mouth daily.     . NON FORMULARY Diet Type: NAS     . omeprazole (PRILOSEC) 20 MG capsule Take 1  capsule (20 mg total) by mouth every morning.     Oneta Rack Supplies (SKIN PREP WIPES) MISC Apply to bilateral heels every shift for prevention     . Polyethyl Glycol-Propyl Glycol (SYSTANE OP) Place 1 drop into both eyes daily as needed. Dry Eyes      . polyethylene glycol (MIRALAX / GLYCOLAX) 17 g packet Take 17 g by mouth daily as needed for mild constipation. (Patient not taking: Reported on 02/28/2019) 14 each 0   . QUEtiapine (SEROQUEL) 25 MG tablet Take 1 tablet (25 mg total) by mouth at bedtime. 30 tablet 0   . sertraline (ZOLOFT) 50 MG tablet Take 1 tablet (50 mg total) by mouth every morning. 30 tablet 0    Scheduled: . apixaban  2.5 mg Oral BID  . Chlorhexidine Gluconate Cloth  6 each Topical Daily  . diltiazem  90 mg Oral Q6H  . magnesium oxide  400 mg Oral TID  . memantine  5 mg Oral BID  . pantoprazole  40 mg Oral Daily  . QUEtiapine  25 mg Oral QHS  . sertraline  50 mg Oral q morning - 10a  . sodium chloride flush  3 mL Intravenous Once   Continuous: . sodium chloride 100 mL/hr at 04/11/19 0759   [id number] **OR** acetaminophen, diltiazem, diphenoxylate-atropine, ondansetron (ZOFRAN) IV, polyethylene glycol, polyvinyl alcohol  Assesment: She was admitted with what is likely a viral gastroenteritis with severe nausea and vomiting severe dehydration hyperkalemia and acute renal failure.  She was bradycardic also and I think that is likely related to the hyperkalemia.  Her potassium is back to normal.  Renal function has not recovered yet.  Her renal ultrasound was normal.  I think she still may be mildly volume depleted  She has atrial fib and has had rapid ventricular response.  She is on immediate release diltiazem with plans to switch to long-acting diltiazem.  I will leave that decision to cardiology.  She has dementia which is stable   Principal Problem:   Bradycardia Active Problems:   Dementia (HCC)   A-fib (HCC)   Hypotension   Hyperkalemia   ARF (acute  renal failure) (HCC)    Plan: Increase IV fluids.  She may need renal consult depending on her response.  Updated her daughter by telephone    LOS: 1 day   Alonza Bogus 04/11/2019, 8:12 AM

## 2019-04-11 NOTE — Care Management Important Message (Signed)
Important Message  Patient Details  Name: Kristy Mckinney MRN: [id number] Date of Birth: [birth year]   Medicare Important Message Given:  Yes     Tommy Medal 04/11/2019, 12:53 PM

## 2019-04-11 NOTE — Progress Notes (Signed)
Progress Note  Patient Name: Kristy Mckinney Date of Encounter: 04/11/2019  Primary Cardiologist: Rozann Lesches, MD   Subjective   No complaints  Inpatient Medications    Scheduled Meds: . apixaban  2.5 mg Oral BID  . Chlorhexidine Gluconate Cloth  6 each Topical Daily  . diltiazem  90 mg Oral Q6H  . magnesium oxide  400 mg Oral TID  . memantine  5 mg Oral BID  . pantoprazole  40 mg Oral Daily  . QUEtiapine  25 mg Oral QHS  . sertraline  50 mg Oral q morning - 10a  . sodium chloride flush  3 mL Intravenous Once   Continuous Infusions: . sodium chloride 100 mL/hr at 04/11/19 0759   PRN Meds: acetaminophen **OR** acetaminophen, diltiazem, diphenoxylate-atropine, ondansetron (ZOFRAN) IV, polyethylene glycol, polyvinyl alcohol   Vital Signs    Vitals:   04/11/19 0557 04/11/19 0600 04/11/19 0700 04/11/19 0749  BP: 121/67 122/61 105/65   Pulse:  (!) 109 97   Resp:  18 20   Temp:    98.1 F (36.7 C)  TempSrc:    Oral  SpO2:  98% 97%   Weight: 67.7 kg     Height:        Intake/Output Summary (Last 24 hours) at 04/11/2019 0851 Last data filed at 04/11/2019 0706 Gross per 24 hour  Intake 883.95 ml  Output 300 ml  Net 583.95 ml   Last 3 Weights 04/11/2019 04/10/2019 04/10/2019  Weight (lbs) 149 lb 4 oz 145 lb 11.6 oz 137 lb  Weight (kg) 67.7 kg 66.1 kg 62.143 kg      Telemetry    afib normal rates - Personally Reviewed  ECG    n/a - Personally Reviewed  Physical Exam   GEN: No acute distress.   Neck: No JVD Cardiac: irreg, no murmurs, rubs, or gallops.  Respiratory: Clear to auscultation bilaterally. GI: Soft, nontender, non-distended  MS: No edema; No deformity. Neuro:  Nonfocal  Psych: Normal affect   Labs    High Sensitivity Troponin:   Recent Labs  Lab 04/04/19 0714 04/04/19 0937 04/10/19 0357 04/10/19 0610  TROPONINIHS 8 8 7 8       Chemistry Recent Labs  Lab 04/10/19 0104 04/10/19 0357 04/11/19 0356  NA 132* 137 137  K 6.7*  4.9 4.1  CL 99 99 100  CO2 23 26 28   GLUCOSE 164* 225* 104*  BUN 36* 35* 40*  CREATININE 2.01* 2.06* 2.00*  CALCIUM 8.6* 9.0 8.7*  PROT  --  5.8* 5.9*  ALBUMIN  --  3.2* 3.3*  AST  --  140* 84*  ALT  --  100* 85*  ALKPHOS  --  128* 104  BILITOT  --  2.4* 0.6  GFRNONAA 22* 22* 22*  GFRAA 26* 25* 26*  ANIONGAP 10 12 9      Hematology Recent Labs  Lab 04/05/19 0632 04/10/19 0104 04/11/19 0356  WBC 6.0 10.3 7.8  RBC 4.43 4.42 3.93  HGB 11.6* 11.6* 10.2*  HCT 37.7 39.0 34.9*  MCV 85.1 88.2 88.8  MCH 26.2 26.2 26.0  MCHC 30.8 29.7* 29.2*  RDW 16.8* 16.1* 16.3*  PLT 256 273 209    BNPNo results for input(s): BNP, PROBNP in the last 168 hours.   DDimer  Recent Labs  Lab 04/04/19 [id number]  DDIMER 1.03*     Radiology    US Renal  Result Date: 04/10/2019 CLINICAL DATA:  Acute renal failure EXAM: RENAL / URINARY TRACT ULTRASOUND COMPLETE  COMPARISON:  05/11/2015 FINDINGS: Right Kidney: Renal measurements: 10.5 x 4.8 x 5.4 cm. = volume: 143 mL . Echogenicity within normal limits. No mass or hydronephrosis visualized. Left Kidney: Renal measurements: 11.4 x 4.7 x 6.3 cm. = volume: 177 mL. Echogenicity within normal limits. No mass or hydronephrosis visualized. Bladder: Decompressed IMPRESSION: Normal appearing renal ultrasound. Electronically Signed   By: Inez Catalina M.D.   On: 04/10/2019 09:08    Cardiac Studies    Patient Profile     Kristy Mckinney is a 85 y.o. female with past medical history of permanent atrial fibrillation (diagnosed in 11/2018), mitral stenosis and chronic back pain who is being seen today for the evaluation of atrial fibrillation with bradycardia at the request of Dr. Luan Pulling.   Assessment & Plan    1. Permanent afib - - Recently admitted with atrial fibrillation with RVR but found to be in atrial fibrillation with slow ventricular response and heart rate in the 40's in the setting of AKI, hypotension, possible GI illness, and hyperkalemia  (K+  6.7). With correction of her hyperkalemia, her heart rate has improved   - we stopped her home lopressor, changed her dilt to short acting 90mg  every 6 hours. Some doses held yesterday due to soft bp's, continue short acting today, can consolidate back to long acting once she is back to her baseline hemodynamics. Remain off lopressor for now.   2. AKI - baseline creatinine 0.8 - 0.9. Elevated to 2.01 on admission. Renal US showing no acute findings. She has been started on IVF  - Cr remains around 2, continued IVFs per primary team  3. Hyperkalemia - resolved  4. Nausea/diarrhea - suspected viral gastroentertiis, presented with dehydration, AKI, and hyperkalemia.    For questions or updates, please contact [place] Please consult [email or web address] for contact info under        Signed, Carlyle Dolly, MD  04/11/2019, 8:51 AM

## 2019-04-12 DIAGNOSIS — R112 Nausea with vomiting, unspecified: Secondary | ICD-10-CM

## 2019-04-12 DIAGNOSIS — F0391 Unspecified dementia with behavioral disturbance: Secondary | ICD-10-CM

## 2019-04-12 LAB — BASIC METABOLIC PANEL
Anion gap: 8 (ref 5–15)
BUN: 30 mg/dL — ABNORMAL HIGH (ref 8–23)
CO2: 26 mmol/L (ref 22–32)
Calcium: 8.7 mg/dL — ABNORMAL LOW (ref 8.9–10.3)
Chloride: 103 mmol/L (ref 98–111)
Creatinine, Ser: 1.03 mg/dL — ABNORMAL HIGH (ref 0.44–1.00)
GFR calc Af Amer: 58 mL/min — ABNORMAL LOW (ref >60)
GFR calc non Af Amer: 50 mL/min — ABNORMAL LOW (ref >60)
Glucose, Bld: 104 mg/dL — ABNORMAL HIGH (ref 70–99)
Potassium: 4.2 mmol/L (ref 3.5–5.1)
Sodium: 137 mmol/L (ref 135–145)

## 2019-04-12 LAB — CBC
HCT: 35 % — ABNORMAL LOW (ref 36.0–46.0)
Hemoglobin: 10 g/dL — ABNORMAL LOW (ref 12.0–15.0)
MCH: 25.7 pg — ABNORMAL LOW (ref 26.0–34.0)
MCHC: 28.6 g/dL — ABNORMAL LOW (ref 30.0–36.0)
MCV: 90 fL (ref 80.0–100.0)
Platelets: 203 10*3/uL (ref 150–400)
RBC: 3.89 MIL/uL (ref 3.87–5.11)
RDW: 16.3 % — ABNORMAL HIGH (ref 11.5–15.5)
WBC: 7 10*3/uL (ref 4.0–10.5)
nRBC: 0 % (ref 0.0–0.2)

## 2019-04-12 LAB — MAGNESIUM: Magnesium: 2.1 mg/dL (ref 1.7–2.4)

## 2019-04-12 MED ORDER — ALUM & MAG HYDROXIDE-SIMETH 200-200-20 MG/5ML PO SUSP
30.0000 mL | Freq: Once | ORAL | Status: AC
  Administered 2019-04-12: 15:00:00 30 mL via ORAL
  Filled 2019-04-12: qty 30

## 2019-04-12 MED ORDER — DILTIAZEM HCL ER COATED BEADS 180 MG PO CP24
360.0000 mg | ORAL_CAPSULE | Freq: Every day | ORAL | Status: DC
  Administered 2019-04-12 – 2019-04-19 (×8): 360 mg via ORAL
  Filled 2019-04-12 (×8): qty 2

## 2019-04-12 MED ORDER — ONDANSETRON HCL 4 MG PO TABS
4.0000 mg | ORAL_TABLET | Freq: Three times a day (TID) | ORAL | Status: DC | PRN
  Administered 2019-04-12: 16:00:00 4 mg via ORAL
  Filled 2019-04-12: qty 1

## 2019-04-12 MED ORDER — APIXABAN 5 MG PO TABS
5.0000 mg | ORAL_TABLET | Freq: Two times a day (BID) | ORAL | Status: DC
  Administered 2019-04-12 – 2019-04-13 (×3): 5 mg via ORAL
  Filled 2019-04-12 (×3): qty 1

## 2019-04-12 MED ORDER — METOPROLOL TARTRATE 25 MG PO TABS
25.0000 mg | ORAL_TABLET | Freq: Two times a day (BID) | ORAL | Status: DC
  Administered 2019-04-12 – 2019-04-19 (×14): 25 mg via ORAL
  Filled 2019-04-12 (×15): qty 1

## 2019-04-12 NOTE — Progress Notes (Signed)
Attempting to d/c pt home and pt began c/o gas pain and belching. Administered Maalox with some relief. Pt then began dry heaving and had small amount of emesis. Denies CP or SOB at this time. After V/ pt felt some relief of stomach pain. MD Memon notified by fellow nurse.

## 2019-04-12 NOTE — Progress Notes (Signed)
Attempted to notify pt's daughter, [name], regarding pt's status. No answer from home or cell phone numbers listed.

## 2019-04-12 NOTE — Progress Notes (Signed)
PROGRESS NOTE    Kristy Mckinney  [id number] DOB: October 21, 1931 DOA: 04/10/2019 PCP: [name], MD    Brief Narrative:  85 year old female who was admitted to the hospital with probable viral gastroenteritis and severe dehydration with hyperkalemia and acute renal failure.  She was also bradycardic which was felt to be related to the hyperkalemia as well as her rate control medications for atrial fibrillation.  She was hydrated with IV fluids with improvement of renal function.  Rate control medication was initially held, but then resumed as heart rate improved.  We will plan to discharge home in next 24 hours if nausea and vomiting resolves.   Assessment & Plan:   Principal Problem:   Bradycardia Active Problems:   Dementia (HCC)   A-fib (HCC)   Hypotension   Hyperkalemia   ARF (acute renal failure) ([place])   1. Acute renal failure.  Related to volume depletion in the setting of GI losses.  Patient has been on IV fluids and overall renal function has improved back to baseline.  Will discontinue further fluids.  Renal ultrasound was unrevealing 2. Hyperkalemia.  Related to renal failure.  Was likely contributing to bradycardia as well too.  Potassium has normalized with hydration. 3. Bradycardia with atrial fibrillation.  Cardizem and metoprolol held on admission.  Overall hyperkalemia has also improved.  Cardizem has been restarted and she is back on her home dose of 360 mg daily.  We will also restart metoprolol since heart rate is resting in the 90s.  Continue anticoagulation for atrial fibrillation 4. Nausea and vomiting.  Possibly related to viral gastroenteritis.  Patient had initially improved and was tolerating solid diet.  After discharge with plan, she had recurrent nausea and vomiting.  Plan will be to keep her in the hospital overnight to ensure that she can keep down her food as well as medications.     DVT prophylaxis: Apixaban Code Status: Full code Family  Communication: Discussed with daughter Disposition Plan: Discharge home in a.m. if nausea and vomiting have resolved and she is able to keep down her food.   Consultants:   Cardiology  Procedures:  Echo:  1. Left ventricular ejection fraction, by visual estimation, is 70 to 75%. The left ventricle has hyperdynamic function. Decreased left ventricular size. There is moderately increased left ventricular hypertrophy.  2. Left ventricular diastolic Doppler parameters are indeterminate pattern of LV diastolic filling.  3. Right ventricular volume and pressure overload.  4. Global right ventricle has normal systolic function.The right ventricular size is moderately enlarged. Mildly increased right ventricular wall thickness.  5. Left atrial size was severely dilated.  6. Right atrial size was normal.  7. Presence of pericardial fat pad.  8. Moderate aortic valve annular calcification.  9. Moderate to severe mitral annular calcification. 10. Severe calcification of the mitral valve leaflet(s). Valve appears stenotic, not completely evaluated. Suggest follow-up images with VTI measurements or planimetry if possible. 11. The mitral valve is degenerative. Mild mitral valve regurgitation. 12. The tricuspid valve is grossly normal. Tricuspid valve regurgitation is mild. 13. Aortic valve area, by VTI measures 1.83 cm. 14. Aortic valve mean gradient measures 8.8 mmHg. 15. The aortic valve is tricuspid Aortic valve regurgitation was not visualized by color flow Doppler. Mild aortic valve stenosis. 16. Moderately elevated pulmonary artery systolic pressure. 17. The tricuspid regurgitant velocity is 3.67 m/s, and with an assumed right atrial pressure of 8 mmHg, the estimated right ventricular systolic pressure is moderately elevated at 61.9 mmHg. 18.  The inferior vena cava is normal in size with <50% respiratory variability, suggesting right atrial pressure of 8 mmHg.  19. The pulmonic valve was grossly  normal. Pulmonic valve regurgitation is trivial by color flow Doppler.  Antimicrobials:      Subjective: Patient was initially feeling better.  Nausea and vomiting had improved.  No dizziness or lightheadedness.  Wanted to go home.  Objective: Vitals:   04/12/19 1600 04/12/19 1620 04/12/19 1700 04/12/19 1800  BP: (!) 115/54  110/60 (!) 102/57  Pulse: (!) 53  (!) 53 66  Resp: (!) 26  (!) 22 (!) 25  Temp:  98 F (36.7 C)    TempSrc:  Oral    SpO2: 94%  94% 91%  Weight:      Height:        Intake/Output Summary (Last 24 hours) at 04/12/2019 1910 Last data filed at 04/12/2019 0547 Gross per 24 hour  Intake 1161.43 ml  Output 400 ml  Net 761.43 ml   Filed Weights   04/10/19 1238 04/11/19 0557 04/12/19 0405  Weight: 66.1 kg 67.7 kg 70.1 kg    Examination:  General exam: Appears calm and comfortable  Respiratory system: Clear to auscultation. Respiratory effort normal. Cardiovascular system: S1 & S2 heard, RRR. No JVD, murmurs, rubs, gallops or clicks. No pedal edema. Gastrointestinal system: Abdomen is nondistended, soft and nontender. No organomegaly or masses felt. Normal bowel sounds heard. Central nervous system: Alert and oriented. No focal neurological deficits. Extremities: Symmetric 5 x 5 power. Skin: No rashes, lesions or ulcers Psychiatry: Judgement and insight appear normal. Mood & affect appropriate.     Data Reviewed: I have personally reviewed following labs and imaging studies  CBC: Recent Labs  Lab 04/10/19 0104 04/11/19 0356 04/12/19 0452  WBC 10.3 7.8 7.0  HGB 11.6* 10.2* 10.0*  HCT 39.0 34.9* 35.0*  MCV 88.2 88.8 90.0  PLT 273 209 [id number]   Basic Metabolic Panel: Recent Labs  Lab 04/10/19 0104 04/10/19 0357 04/11/19 0356 04/12/19 0452  NA 132* 137 137 137  K 6.7* 4.9 4.1 4.2  CL 99 99 100 103  CO2 23 26 28 26   GLUCOSE 164* 225* 104* 104*  BUN 36* 35* 40* 30*  CREATININE 2.01* 2.06* 2.00* 1.03*  CALCIUM 8.6* 9.0 8.7* 8.7*  MG  --    --   --  2.1   GFR: Estimated Creatinine Clearance: 39.1 mL/min (A) (by C-G formula based on SCr of 1.03 mg/dL (H)). Liver Function Tests: Recent Labs  Lab 04/10/19 0357 04/11/19 0356  AST 140* 84*  ALT 100* 85*  ALKPHOS 128* 104  BILITOT 2.4* 0.6  PROT 5.8* 5.9*  ALBUMIN 3.2* 3.3*   No results for input(s): LIPASE, AMYLASE in the last 168 hours. No results for input(s): AMMONIA in the last 168 hours. Coagulation Profile: No results for input(s): INR, PROTIME in the last 168 hours. Cardiac Enzymes: No results for input(s): CKTOTAL, CKMB, CKMBINDEX, TROPONINI in the last 168 hours. BNP (last 3 results) No results for input(s): PROBNP in the last 8760 hours. HbA1C: No results for input(s): HGBA1C in the last 72 hours. CBG: Recent Labs  Lab 04/10/19 0051  GLUCAP 153*   Lipid Profile: No results for input(s): CHOL, HDL, LDLCALC, TRIG, CHOLHDL, LDLDIRECT in the last 72 hours. Thyroid Function Tests: No results for input(s): TSH, T4TOTAL, FREET4, T3FREE, THYROIDAB in the last 72 hours. Anemia Panel: No results for input(s): VITAMINB12, FOLATE, FERRITIN, TIBC, IRON, RETICCTPCT in the last 72 hours. Sepsis  Labs: No results for input(s): PROCALCITON, LATICACIDVEN in the last 168 hours.  Recent Results (from the past 240 hour(s))  SARS CORONAVIRUS 2 (TAT 6-24 HRS) Nasopharyngeal Nasopharyngeal Swab     Status: None   Collection Time: 04/04/19 11:12 AM   Specimen: Nasopharyngeal Swab  Result Value Ref Range Status   SARS Coronavirus 2 NEGATIVE NEGATIVE Final    Comment: (NOTE) SARS-CoV-2 target nucleic acids are NOT DETECTED. The SARS-CoV-2 RNA is generally detectable in upper and lower respiratory specimens during the acute phase of infection. Negative results do not preclude SARS-CoV-2 infection, do not rule out co-infections with other pathogens, and should not be used as the sole basis for treatment or other patient management decisions. Negative results must be  combined with clinical observations, patient history, and epidemiological information. The expected result is Negative. Fact Sheet for Patients: SugarRoll.be Fact Sheet for Healthcare Providers: https://www.woods-mathews.com/ This test is not yet approved or cleared by the [place] FDA and  has been authorized for detection and/or diagnosis of SARS-CoV-2 by FDA under an Emergency Use Authorization (EUA). This EUA will remain  in effect (meaning this test can be used) for the duration of the COVID-19 declaration under Section 56 4(b)(1) of the Act, 21 U.S.C. section 360bbb-3(b)(1), unless the authorization is terminated or revoked sooner. Performed at [facility], [place] [address] [place], [place] [address]   SARS CORONAVIRUS 2 (TAT 6-24 HRS) Nasopharyngeal Nasopharyngeal Swab     Status: None   Collection Time: 04/10/19  3:31 AM   Specimen: Nasopharyngeal Swab  Result Value Ref Range Status   SARS Coronavirus 2 NEGATIVE NEGATIVE Final    Comment: (NOTE) SARS-CoV-2 target nucleic acids are NOT DETECTED. The SARS-CoV-2 RNA is generally detectable in upper and lower respiratory specimens during the acute phase of infection. Negative results do not preclude SARS-CoV-2 infection, do not rule out co-infections with other pathogens, and should not be used as the sole basis for treatment or other patient management decisions. Negative results must be combined with clinical observations, patient history, and epidemiological information. The expected result is Negative. Fact Sheet for Patients: SugarRoll.be Fact Sheet for Healthcare Providers: https://www.woods-mathews.com/ This test is not yet approved or cleared by the [place] FDA and  has been authorized for detection and/or diagnosis of SARS-CoV-2 by FDA under an Emergency Use Authorization (EUA). This EUA will remain  in effect (meaning  this test can be used) for the duration of the COVID-19 declaration under Section 56 4(b)(1) of the Act, 21 U.S.C. section 360bbb-3(b)(1), unless the authorization is terminated or revoked sooner. Performed at [facility], [place] [address] [place], [place] [address]   MRSA PCR Screening     Status: None   Collection Time: 04/10/19 12:00 PM   Specimen: Nasopharyngeal  Result Value Ref Range Status   MRSA by PCR NEGATIVE NEGATIVE Final    Comment:        The GeneXpert MRSA Assay (FDA approved for NASAL specimens only), is one component of a comprehensive MRSA colonization surveillance program. It is not intended to diagnose MRSA infection nor to guide or monitor treatment for MRSA infections. Performed at [facility], [address], [place], [place] [address]          Radiology Studies: No results found.      Scheduled Meds: . apixaban  5 mg Oral BID  . Chlorhexidine Gluconate Cloth  6 each Topical Daily  . diltiazem  360 mg Oral Daily  . magnesium  oxide  400 mg Oral TID  . memantine  5 mg Oral BID  . metoprolol tartrate  25 mg Oral BID  . pantoprazole  40 mg Oral Daily  . QUEtiapine  25 mg Oral QHS  . sertraline  50 mg Oral q morning - 10a  . sodium chloride flush  3 mL Intravenous Once   Continuous Infusions:   LOS: 2 days    Time spent: 3mins    Kathie Dike, MD [facility]   If 7PM-7AM, please contact night-coverage [email or web address]  04/12/2019, 7:10 PM

## 2019-04-12 NOTE — Progress Notes (Signed)
Reviewed AVS with pt at this time. IV and monitoring removed. All pt belongings accounted for per pt. Denies any questions or concerns at this time. Family to transport pt back to her residence. MD Roderic Palau has spoken to daughter regarding d/c and follow up care.

## 2019-04-13 ENCOUNTER — Inpatient Hospital Stay (HOSPITAL_COMMUNITY): Payer: PPO

## 2019-04-13 DIAGNOSIS — K859 Acute pancreatitis without necrosis or infection, unspecified: Secondary | ICD-10-CM | POA: Diagnosis present

## 2019-04-13 DIAGNOSIS — J90 Pleural effusion, not elsewhere classified: Secondary | ICD-10-CM | POA: Diagnosis present

## 2019-04-13 DIAGNOSIS — J9601 Acute respiratory failure with hypoxia: Secondary | ICD-10-CM

## 2019-04-13 LAB — PROTEIN ELECTROPHORESIS, SERUM
A/G Ratio: 1.3 (ref 0.7–1.7)
Albumin ELP: 3.3 g/dL (ref 2.9–4.4)
Alpha-1-Globulin: 0.3 g/dL (ref 0.0–0.4)
Alpha-2-Globulin: 0.7 g/dL (ref 0.4–1.0)
Beta Globulin: 0.9 g/dL (ref 0.7–1.3)
Gamma Globulin: 0.7 g/dL (ref 0.4–1.8)
Globulin, Total: 2.5 g/dL (ref 2.2–3.9)
Total Protein ELP: 5.8 g/dL — ABNORMAL LOW (ref 6.0–8.5)

## 2019-04-13 LAB — BASIC METABOLIC PANEL
Anion gap: 9 (ref 5–15)
BUN: 34 mg/dL — ABNORMAL HIGH (ref 8–23)
CO2: 24 mmol/L (ref 22–32)
Calcium: 8.9 mg/dL (ref 8.9–10.3)
Chloride: 104 mmol/L (ref 98–111)
Creatinine, Ser: 1.54 mg/dL — ABNORMAL HIGH (ref 0.44–1.00)
GFR calc Af Amer: 36 mL/min — ABNORMAL LOW (ref >60)
GFR calc non Af Amer: 31 mL/min — ABNORMAL LOW (ref >60)
Glucose, Bld: 116 mg/dL — ABNORMAL HIGH (ref 70–99)
Potassium: 5.3 mmol/L — ABNORMAL HIGH (ref 3.5–5.1)
Sodium: 137 mmol/L (ref 135–145)

## 2019-04-13 LAB — LIPASE, BLOOD: Lipase: 16 U/L (ref 11–51)

## 2019-04-13 LAB — BRAIN NATRIURETIC PEPTIDE: B Natriuretic Peptide: 317 pg/mL — ABNORMAL HIGH (ref 0.0–100.0)

## 2019-04-13 MED ORDER — SODIUM ZIRCONIUM CYCLOSILICATE 10 G PO PACK
10.0000 g | PACK | Freq: Once | ORAL | Status: AC
  Administered 2019-04-13: 14:00:00 10 g via ORAL
  Filled 2019-04-13: qty 1

## 2019-04-13 MED ORDER — IOHEXOL 300 MG/ML  SOLN
30.0000 mL | Freq: Once | INTRAMUSCULAR | Status: AC | PRN
  Administered 2019-04-13: 30 mL via ORAL

## 2019-04-13 MED ORDER — LACTATED RINGERS IV SOLN
INTRAVENOUS | Status: DC
  Administered 2019-04-13: 14:00:00 via INTRAVENOUS

## 2019-04-13 MED ORDER — APIXABAN 2.5 MG PO TABS
2.5000 mg | ORAL_TABLET | Freq: Two times a day (BID) | ORAL | Status: DC
  Administered 2019-04-13 – 2019-04-19 (×12): 2.5 mg via ORAL
  Filled 2019-04-13 (×12): qty 1

## 2019-04-13 MED ORDER — SODIUM CHLORIDE 0.9% FLUSH
3.0000 mL | Freq: Two times a day (BID) | INTRAVENOUS | Status: DC
  Administered 2019-04-13 – 2019-04-19 (×11): 3 mL via INTRAVENOUS

## 2019-04-13 NOTE — Progress Notes (Signed)
Patient has had periods of oxygen desaturation into the mid 80s during the night.  Oxygen started at 2L/[place].

## 2019-04-13 NOTE — Progress Notes (Signed)
PROGRESS NOTE    Kristy Mckinney  [id number] DOB: 1932/03/12 DOA: 04/10/2019 PCP: [name], MD    Brief Narrative:  85 year old female who was admitted to the hospital with probable viral gastroenteritis and severe dehydration with hyperkalemia and acute renal failure.  She was also bradycardic which was felt to be related to the hyperkalemia as well as her rate control medications for atrial fibrillation.  She was hydrated with IV fluids with improvement of renal function.  Rate control medication was initially held, but then resumed as heart rate improved.  Unfortunately, her GI symptoms persisted.  CT scan performed shows possible pancreatitis.  She is also developed shortness of breath and imaging indicates bilateral pleural effusions.  Further work-up in process.   Assessment & Plan:   Principal Problem:   Bradycardia Active Problems:   Dementia (HCC)   A-fib (HCC)   Hypotension   Hyperkalemia   ARF (acute renal failure) (HCC)   Pleural effusion   Pancreatitis, acute   Acute respiratory failure with hypoxia ([place])   1. Acute renal failure.  Related to volume depletion in the setting of GI losses.  Patient has been on IV fluids and overall improvement of renal function.  Renal ultrasound was unrevealing.  Fluids discontinued on 10/3, but creatinine has bumped back up again today to 1.5.  With evidence of pleural effusions, will hold off on further IV fluids for now and recheck labs in the a.m.  Encourage p.o. fluids. 2. Hyperkalemia.  Related to renal failure.  Was likely contributing to bradycardia as well too.  Potassium had initially normalized, but has trended back up today.  We will give 1 dose of Lokelma. 3. Bradycardia with atrial fibrillation.  Cardizem and metoprolol held on admission.  Overall hyperkalemia had also improved.  Cardizem was restarted and she is back on her home dose of 360 mg daily.  She was also restarted on her home dose of metoprolol yesterday.   Continue anticoagulation for atrial fibrillation 4. Nausea and vomiting.  Initially felt to be related to viral gastroenteritis.  Since patient had ongoing symptoms with abdominal discomfort, CT of the abdomen was ordered today.  This showed possible pancreatitis/duodenitis.  Check liver enzymes and lipase.  She is already status post cholecystectomy.  Keep the patient on clear liquids.  Continue supportive care. 5. Acute respiratory failure secondary to bilateral pleural effusions.  Likely precipitated by IV fluid hydration for initial acute renal failure.  Continue to wean off oxygen as tolerated. 6. Acute diastolic congestive heart failure.  Patient has evidence of pleural effusions.  Check BNP.  Most recent echocardiogram from 03/2019 shows ejection fraction of 70%.  Will avoid Lasix at this time in the setting of renal failure/pancreatitis.  Can reconsider in a.m. pending clinical course. 7. Bilateral pleural effusions.  Likely related to IV hydration.  Echocardiogram shows preserved ejection fraction.  Can consider thoracentesis if symptoms progress.   DVT prophylaxis: Apixaban Code Status: Full code Family Communication: Discussed with daughter Disposition Plan: Discharge home once clinically improved   Consultants:   Cardiology  Procedures:  Echo:  1. Left ventricular ejection fraction, by visual estimation, is 70 to 75%. The left ventricle has hyperdynamic function. Decreased left ventricular size. There is moderately increased left ventricular hypertrophy.  2. Left ventricular diastolic Doppler parameters are indeterminate pattern of LV diastolic filling.  3. Right ventricular volume and pressure overload.  4. Global right ventricle has normal systolic function.The right ventricular size is moderately enlarged. Mildly increased right  ventricular wall thickness.  5. Left atrial size was severely dilated.  6. Right atrial size was normal.  7. Presence of pericardial fat pad.  8.  Moderate aortic valve annular calcification.  9. Moderate to severe mitral annular calcification. 10. Severe calcification of the mitral valve leaflet(s). Valve appears stenotic, not completely evaluated. Suggest follow-up images with VTI measurements or planimetry if possible. 11. The mitral valve is degenerative. Mild mitral valve regurgitation. 12. The tricuspid valve is grossly normal. Tricuspid valve regurgitation is mild. 13. Aortic valve area, by VTI measures 1.83 cm. 14. Aortic valve mean gradient measures 8.8 mmHg. 15. The aortic valve is tricuspid Aortic valve regurgitation was not visualized by color flow Doppler. Mild aortic valve stenosis. 16. Moderately elevated pulmonary artery systolic pressure. 17. The tricuspid regurgitant velocity is 3.67 m/s, and with an assumed right atrial pressure of 8 mmHg, the estimated right ventricular systolic pressure is moderately elevated at 61.9 mmHg. 18. The inferior vena cava is normal in size with <50% respiratory variability, suggesting right atrial pressure of 8 mmHg.  19. The pulmonic valve was grossly normal. Pulmonic valve regurgitation is trivial by color flow Doppler.  Antimicrobials:      Subjective: Feels more short of breath today.  Also continues to have nausea and abdominal discomfort.  Had an episode of vomiting last night, none since then.  P.o. intake remains poor.  Objective: Vitals:   04/13/19 1500 04/13/19 1600 04/13/19 1700 04/13/19 1737  BP: 109/60 104/73 108/68   Pulse:    62  Resp: (!) 21 (!) 21 18 18   Temp:      TempSrc:      SpO2:    97%  Weight:      Height:        Intake/Output Summary (Last 24 hours) at 04/13/2019 1804 Last data filed at 04/13/2019 1536 Gross per 24 hour  Intake 21.55 ml  Output --  Net 21.55 ml   Filed Weights   04/11/19 0557 04/12/19 0405 04/13/19 0500  Weight: 67.7 kg 70.1 kg 70.5 kg    Examination:  General exam: Alert, awake, oriented x 3 Respiratory system: Diminished  breath sounds at bases. Respiratory effort normal. Cardiovascular system:RRR. No murmurs, rubs, gallops. Gastrointestinal system: Abdomen is nondistended, soft and tender in periumbilical area. No organomegaly or masses felt. Normal bowel sounds heard. Central nervous system: Alert and oriented. No focal neurological deficits. Extremities: No C/C/E, +pedal pulses Skin: No rashes, lesions or ulcers Psychiatry: Judgement and insight appear normal. Mood & affect appropriate.      Data Reviewed: I have personally reviewed following labs and imaging studies  CBC: Recent Labs  Lab 04/10/19 0104 04/11/19 0356 04/12/19 0452  WBC 10.3 7.8 7.0  HGB 11.6* 10.2* 10.0*  HCT 39.0 34.9* 35.0*  MCV 88.2 88.8 90.0  PLT 273 209 [id number]   Basic Metabolic Panel: Recent Labs  Lab 04/10/19 0104 04/10/19 0357 04/11/19 0356 04/12/19 0452 04/13/19 0428  NA 132* 137 137 137 137  K 6.7* 4.9 4.1 4.2 5.3*  CL 99 99 100 103 104  CO2 23 26 28 26 24   GLUCOSE 164* 225* 104* 104* 116*  BUN 36* 35* 40* 30* 34*  CREATININE 2.01* 2.06* 2.00* 1.03* 1.54*  CALCIUM 8.6* 9.0 8.7* 8.7* 8.9  MG  --   --   --  2.1  --    GFR: Estimated Creatinine Clearance: 26.2 mL/min (A) (by C-G formula based on SCr of 1.54 mg/dL (H)). Liver Function Tests: Recent Labs  Lab 04/10/19 0357 04/11/19 0356  AST 140* 84*  ALT 100* 85*  ALKPHOS 128* 104  BILITOT 2.4* 0.6  PROT 5.8* 5.9*  ALBUMIN 3.2* 3.3*   No results for input(s): LIPASE, AMYLASE in the last 168 hours. No results for input(s): AMMONIA in the last 168 hours. Coagulation Profile: No results for input(s): INR, PROTIME in the last 168 hours. Cardiac Enzymes: No results for input(s): CKTOTAL, CKMB, CKMBINDEX, TROPONINI in the last 168 hours. BNP (last 3 results) No results for input(s): PROBNP in the last 8760 hours. HbA1C: No results for input(s): HGBA1C in the last 72 hours. CBG: Recent Labs  Lab 04/10/19 0051  GLUCAP 153*   Lipid Profile: No  results for input(s): CHOL, HDL, LDLCALC, TRIG, CHOLHDL, LDLDIRECT in the last 72 hours. Thyroid Function Tests: No results for input(s): TSH, T4TOTAL, FREET4, T3FREE, THYROIDAB in the last 72 hours. Anemia Panel: No results for input(s): VITAMINB12, FOLATE, FERRITIN, TIBC, IRON, RETICCTPCT in the last 72 hours. Sepsis Labs: No results for input(s): PROCALCITON, LATICACIDVEN in the last 168 hours.  Recent Results (from the past 240 hour(s))  SARS CORONAVIRUS 2 (TAT 6-24 HRS) Nasopharyngeal Nasopharyngeal Swab     Status: None   Collection Time: 04/04/19 11:12 AM   Specimen: Nasopharyngeal Swab  Result Value Ref Range Status   SARS Coronavirus 2 NEGATIVE NEGATIVE Final    Comment: (NOTE) SARS-CoV-2 target nucleic acids are NOT DETECTED. The SARS-CoV-2 RNA is generally detectable in upper and lower respiratory specimens during the acute phase of infection. Negative results do not preclude SARS-CoV-2 infection, do not rule out co-infections with other pathogens, and should not be used as the sole basis for treatment or other patient management decisions. Negative results must be combined with clinical observations, patient history, and epidemiological information. The expected result is Negative. Fact Sheet for Patients: SugarRoll.be Fact Sheet for Healthcare Providers: https://www.woods-mathews.com/ This test is not yet approved or cleared by the [place] FDA and  has been authorized for detection and/or diagnosis of SARS-CoV-2 by FDA under an Emergency Use Authorization (EUA). This EUA will remain  in effect (meaning this test can be used) for the duration of the COVID-19 declaration under Section 56 4(b)(1) of the Act, 21 U.S.C. section 360bbb-3(b)(1), unless the authorization is terminated or revoked sooner. Performed at [facility], [place] [address] [place], [place] [address]   SARS CORONAVIRUS 2 (TAT 6-24 HRS) Nasopharyngeal  Nasopharyngeal Swab     Status: None   Collection Time: 04/10/19  3:31 AM   Specimen: Nasopharyngeal Swab  Result Value Ref Range Status   SARS Coronavirus 2 NEGATIVE NEGATIVE Final    Comment: (NOTE) SARS-CoV-2 target nucleic acids are NOT DETECTED. The SARS-CoV-2 RNA is generally detectable in upper and lower respiratory specimens during the acute phase of infection. Negative results do not preclude SARS-CoV-2 infection, do not rule out co-infections with other pathogens, and should not be used as the sole basis for treatment or other patient management decisions. Negative results must be combined with clinical observations, patient history, and epidemiological information. The expected result is Negative. Fact Sheet for Patients: SugarRoll.be Fact Sheet for Healthcare Providers: https://www.woods-mathews.com/ This test is not yet approved or cleared by the [place] FDA and  has been authorized for detection and/or diagnosis of SARS-CoV-2 by FDA under an Emergency Use Authorization (EUA). This EUA will remain  in effect (meaning this test can be used) for the duration of the COVID-19 declaration under Section 56 4(b)(1) of the Act,  21 U.S.C. section 360bbb-3(b)(1), unless the authorization is terminated or revoked sooner. Performed at [facility], [place] [address] [place], [place] [address]   MRSA PCR Screening     Status: None   Collection Time: 04/10/19 12:00 PM   Specimen: Nasopharyngeal  Result Value Ref Range Status   MRSA by PCR NEGATIVE NEGATIVE Final    Comment:        The GeneXpert MRSA Assay (FDA approved for NASAL specimens only), is one component of a comprehensive MRSA colonization surveillance program. It is not intended to diagnose MRSA infection nor to guide or monitor treatment for MRSA infections. Performed at [facility], [address] [place], [place] [address]          Radiology  Studies: Ct Abdomen Pelvis Wo Contrast  Result Date: 04/13/2019 CLINICAL DATA:  85 year old female with history of abdominal distension and, nausea and vomiting. EXAM: CT ABDOMEN AND PELVIS WITHOUT CONTRAST TECHNIQUE: Multidetector CT imaging of the abdomen and pelvis was performed following the standard protocol without IV contrast. COMPARISON:  No priors. FINDINGS: Lower chest: Large right and moderate left pleural effusions lying dependently with extensive areas of passive atelectasis in the lung bases bilaterally. Mild cardiomegaly. Atherosclerotic calcifications in the descending thoracic aorta as well as the left main, left anterior descending, left circumflex and right coronary arteries. Severe calcifications of the aortic valve and mitral annulus. Moderate to large hiatal hernia Hepatobiliary: No definite suspicious cystic or solid hepatic lesions are confidently identified on today's noncontrast CT examination. Status post cholecystectomy. Pancreas: No definite pancreatic mass. Mild peripancreatic stranding. Spleen: Trace amount of perisplenic fluid, otherwise unremarkable. Adrenals/Urinary Tract: 2 mm nonobstructive calculus in the upper pole collecting system of the right kidney and 3 mm nonobstructive calculus in the lower pole collecting system of the left kidney. Unenhanced appearance of the kidneys and bilateral adrenal glands is otherwise unremarkable. No hydroureteronephrosis. Urinary bladder is nearly decompressed, but otherwise unremarkable in appearance. Stomach/Bowel: Normal appearance of the stomach. Mural thickening in the duodenum, most evident in the second portion of the duodenum, with surrounding inflammatory changes. No pathologic dilatation of small bowel or colon. Numerous colonic diverticulae are noted, particularly in the sigmoid colon. Appendix is not confidently identified, likely surgically absent. Vascular/Lymphatic: Aortic atherosclerosis. No lymphadenopathy noted in the  abdomen or pelvis. Reproductive: Status post hysterectomy. Ovaries are not confidently identified and may be surgically absent or atrophic. Other: Small volume of ascites.  No pneumoperitoneum. Musculoskeletal: Chronic appearing compression fracture of L4 with 70% loss of central vertebral body height. There are no aggressive appearing lytic or blastic lesions noted in the visualized portions of the skeleton. Postoperative changes of ORIF in the proximal right femur. IMPRESSION: 1. Inflammatory changes centered around the pancreas and duodenum, concerning for acute pancreatitis and/or acute duodenitis. 2. Small volume of ascites. 3. Large right and moderate left pleural effusions lying dependently with some associated passive atelectasis in the lung bases bilaterally. 4. Tiny 2-3 mm nonobstructive calculi in the collecting systems of both kidneys. No ureteral stones or findings of urinary tract obstruction are noted at this time. 5. Mild cardiomegaly. 6. Aortic atherosclerosis, in addition to left main and 3 vessel coronary artery disease. 7. There are calcifications of the aortic valve and mitral annulus. Echocardiographic correlation for evaluation of potential valvular dysfunction may be warranted if clinically indicated. 8. Moderate to large hiatal hernia. 9. Additional incidental findings, as above. Electronically Signed   By: Vinnie Langton M.D.   On: 04/13/2019 16:20  Dg Chest Port 1 View  Result Date: 04/13/2019 CLINICAL DATA:  Shortness of breath, abdominal pain EXAM: PORTABLE CHEST 1 VIEW COMPARISON:  04/04/2019 FINDINGS: Slight interval increase in layering right pleural effusion and associated atelectasis or consolidation. There may be an additional small left pleural effusion. Mild diffuse interstitial pulmonary opacity, likely edema, similar prior examination. No focal airspace opacity. Cardiomegaly. IMPRESSION: 1. Slight interval increase in layering right pleural effusion and associated  atelectasis or consolidation. There may be an additional small left pleural effusion. 2. Mild diffuse interstitial pulmonary opacity, likely edema, similar to prior examination. No focal airspace opacity. 3.  Cardiomegaly. Electronically Signed   By: Eddie Candle M.D.   On: 04/13/2019 14:48        Scheduled Meds:  apixaban  2.5 mg Oral BID   Chlorhexidine Gluconate Cloth  6 each Topical Daily   diltiazem  360 mg Oral Daily   magnesium oxide  400 mg Oral TID   memantine  5 mg Oral BID   metoprolol tartrate  25 mg Oral BID   pantoprazole  40 mg Oral Daily   QUEtiapine  25 mg Oral QHS   sertraline  50 mg Oral q morning - 10a   sodium chloride flush  3 mL Intravenous Once   Continuous Infusions:   LOS: 3 days    Time spent: 91mins    Kathie Dike, MD [facility]   If 7PM-7AM, please contact night-coverage [email or web address]  04/13/2019, 6:04 PM

## 2019-04-14 LAB — CBC
HCT: 36.5 % (ref 36.0–46.0)
Hemoglobin: 10.6 g/dL — ABNORMAL LOW (ref 12.0–15.0)
MCH: 25.9 pg — ABNORMAL LOW (ref 26.0–34.0)
MCHC: 29 g/dL — ABNORMAL LOW (ref 30.0–36.0)
MCV: 89.2 fL (ref 80.0–100.0)
Platelets: 287 10*3/uL (ref 150–400)
RBC: 4.09 MIL/uL (ref 3.87–5.11)
RDW: 16.4 % — ABNORMAL HIGH (ref 11.5–15.5)
WBC: 8.4 10*3/uL (ref 4.0–10.5)
nRBC: 0 % (ref 0.0–0.2)

## 2019-04-14 LAB — RENAL FUNCTION PANEL
Albumin: 3.3 g/dL — ABNORMAL LOW (ref 3.5–5.0)
Anion gap: 11 (ref 5–15)
BUN: 42 mg/dL — ABNORMAL HIGH (ref 8–23)
CO2: 23 mmol/L (ref 22–32)
Calcium: 9 mg/dL (ref 8.9–10.3)
Chloride: 100 mmol/L (ref 98–111)
Creatinine, Ser: 1.9 mg/dL — ABNORMAL HIGH (ref 0.44–1.00)
GFR calc Af Amer: 28 mL/min — ABNORMAL LOW (ref >60)
GFR calc non Af Amer: 24 mL/min — ABNORMAL LOW (ref >60)
Glucose, Bld: 101 mg/dL — ABNORMAL HIGH (ref 70–99)
Phosphorus: 3.4 mg/dL (ref 2.5–4.6)
Potassium: 5 mmol/L (ref 3.5–5.1)
Sodium: 134 mmol/L — ABNORMAL LOW (ref 135–145)

## 2019-04-14 LAB — COMPREHENSIVE METABOLIC PANEL
ALT: 69 U/L — ABNORMAL HIGH (ref 0–44)
AST: 52 U/L — ABNORMAL HIGH (ref 15–41)
Albumin: 3.3 g/dL — ABNORMAL LOW (ref 3.5–5.0)
Alkaline Phosphatase: 94 U/L (ref 38–126)
Anion gap: 9 (ref 5–15)
BUN: 43 mg/dL — ABNORMAL HIGH (ref 8–23)
CO2: 25 mmol/L (ref 22–32)
Calcium: 9 mg/dL (ref 8.9–10.3)
Chloride: 100 mmol/L (ref 98–111)
Creatinine, Ser: 1.91 mg/dL — ABNORMAL HIGH (ref 0.44–1.00)
GFR calc Af Amer: 27 mL/min — ABNORMAL LOW (ref >60)
GFR calc non Af Amer: 24 mL/min — ABNORMAL LOW (ref >60)
Glucose, Bld: 101 mg/dL — ABNORMAL HIGH (ref 70–99)
Potassium: 5.1 mmol/L (ref 3.5–5.1)
Sodium: 134 mmol/L — ABNORMAL LOW (ref 135–145)
Total Bilirubin: 1.1 mg/dL (ref 0.3–1.2)
Total Protein: 6 g/dL — ABNORMAL LOW (ref 6.5–8.1)

## 2019-04-14 LAB — LIPASE, BLOOD: Lipase: 16 U/L (ref 11–51)

## 2019-04-14 MED ORDER — SODIUM CHLORIDE 0.9 % IV SOLN
INTRAVENOUS | Status: DC
  Administered 2019-04-14 – 2019-04-15 (×3): via INTRAVENOUS

## 2019-04-14 NOTE — Progress Notes (Signed)
Lying in bed moaning.  States she feels "lightheaded" and that she tried to sit up, but could not do it.  B/P 110/60.  No other changes noted in assessment.  Denies any pain at this time.

## 2019-04-14 NOTE — Care Management Important Message (Signed)
Important Message  Patient Details  Name: Kristy Mckinney MRN: [id number] Date of Birth: 03-31-1932   Medicare Important Message Given:  Yes     Tommy Medal 04/14/2019, 2:36 PM

## 2019-04-14 NOTE — Progress Notes (Signed)
Progress Note  Patient Name: Kristy Mckinney Date of Encounter: 04/14/2019  Primary Cardiologist: Rozann Lesches, MD   Subjective    Pt complains of diarrhea   No CP   No SOB   Inpatient Medications    Scheduled Meds: . apixaban  2.5 mg Oral BID  . Chlorhexidine Gluconate Cloth  6 each Topical Daily  . diltiazem  360 mg Oral Daily  . magnesium oxide  400 mg Oral TID  . memantine  5 mg Oral BID  . metoprolol tartrate  25 mg Oral BID  . pantoprazole  40 mg Oral Daily  . QUEtiapine  25 mg Oral QHS  . sertraline  50 mg Oral q morning - 10a  . sodium chloride flush  3 mL Intravenous Once  . sodium chloride flush  3 mL Intravenous Q12H   Continuous Infusions: . sodium chloride 50 mL/hr at 04/14/19 0855   PRN Meds: acetaminophen **OR** acetaminophen, diltiazem, diphenoxylate-atropine, guaiFENesin-dextromethorphan, ondansetron (ZOFRAN) IV, ondansetron, polyethylene glycol, polyvinyl alcohol   Vital Signs    Vitals:   04/14/19 0600 04/14/19 0630 04/14/19 0700 04/14/19 0726  BP: (!) 100/55  (!) 121/57   Pulse: (!) 47 65 88 (!) 125  Resp: 17 17 18 16   Temp:    (!) 97.5 F (36.4 C)  TempSrc:    Oral  SpO2: 94% 95% 93% 96%  Weight:      Height:        Intake/Output Summary (Last 24 hours) at 04/14/2019 0921 Last data filed at 04/13/2019 2211 Gross per 24 hour  Intake 24.55 ml  Output 3 ml  Net 21.55 ml   Last 3 Weights 04/14/2019 04/13/2019 04/12/2019  Weight (lbs) 156 lb 15.5 oz 155 lb 6.8 oz 154 lb 8.7 oz  Weight (kg) 71.2 kg 70.5 kg 70.1 kg      Telemetry    - Personally Reviewed    Physical Exam   GEN: Obese 85 yo in NAD   .   Neck: No JVD Cardiac: Irreg irreg   , no murmurs, rubs, or gallops.  Respiratory: Clear to auscultation bilaterally. GI: Soft, nontender, non-distended  MS: No edema; No deformity. Neuro:  Nonfocal  Psych: Normal affect   Labs    High Sensitivity Troponin:   Recent Labs  Lab 04/04/19 0714 04/04/19 0937 04/10/19 0357  04/10/19 0610  TROPONINIHS 8 8 7 8       Chemistry Recent Labs  Lab 04/10/19 0357 04/11/19 0356 04/12/19 0452 04/13/19 0428 04/14/19 0441  NA 137 137 137 137 134*  134*  K 4.9 4.1 4.2 5.3* 5.0  5.1  CL 99 100 103 104 100  100  CO2 26 28 26 24 23  25   GLUCOSE 225* 104* 104* 116* 101*  101*  BUN 35* 40* 30* 34* 42*  43*  CREATININE 2.06* 2.00* 1.03* 1.54* 1.90*  1.91*  CALCIUM 9.0 8.7* 8.7* 8.9 9.0  9.0  PROT 5.8* 5.9*  --   --  6.0*  ALBUMIN 3.2* 3.3*  --   --  3.3*  3.3*  AST 140* 84*  --   --  52*  ALT 100* 85*  --   --  69*  ALKPHOS 128* 104  --   --  94  BILITOT 2.4* 0.6  --   --  1.1  GFRNONAA 22* 22* 50* 31* 24*  24*  GFRAA 25* 26* 58* 36* 28*  27*  ANIONGAP 12 9 8 9 11   9  Hematology Recent Labs  Lab 04/11/19 0356 04/12/19 0452 04/14/19 0441  WBC 7.8 7.0 8.4  RBC 3.93 3.89 4.09  HGB 10.2* 10.0* 10.6*  HCT 34.9* 35.0* 36.5  MCV 88.8 90.0 89.2  MCH 26.0 25.7* 25.9*  MCHC 29.2* 28.6* 29.0*  RDW 16.3* 16.3* 16.4*  PLT 209 203 287    BNP Recent Labs  Lab 04/13/19 1835  BNP 317.0*     DDimer No results for input(s): DDIMER in the last 168 hours.   Radiology    Ct Abdomen Pelvis Wo Contrast  Result Date: 04/13/2019 CLINICAL DATA:  85 year old female with history of abdominal distension and, nausea and vomiting. EXAM: CT ABDOMEN AND PELVIS WITHOUT CONTRAST TECHNIQUE: Multidetector CT imaging of the abdomen and pelvis was performed following the standard protocol without IV contrast. COMPARISON:  No priors. FINDINGS: Lower chest: Large right and moderate left pleural effusions lying dependently with extensive areas of passive atelectasis in the lung bases bilaterally. Mild cardiomegaly. Atherosclerotic calcifications in the descending thoracic aorta as well as the left main, left anterior descending, left circumflex and right coronary arteries. Severe calcifications of the aortic valve and mitral annulus. Moderate to large hiatal hernia  Hepatobiliary: No definite suspicious cystic or solid hepatic lesions are confidently identified on today's noncontrast CT examination. Status post cholecystectomy. Pancreas: No definite pancreatic mass. Mild peripancreatic stranding. Spleen: Trace amount of perisplenic fluid, otherwise unremarkable. Adrenals/Urinary Tract: 2 mm nonobstructive calculus in the upper pole collecting system of the right kidney and 3 mm nonobstructive calculus in the lower pole collecting system of the left kidney. Unenhanced appearance of the kidneys and bilateral adrenal glands is otherwise unremarkable. No hydroureteronephrosis. Urinary bladder is nearly decompressed, but otherwise unremarkable in appearance. Stomach/Bowel: Normal appearance of the stomach. Mural thickening in the duodenum, most evident in the second portion of the duodenum, with surrounding inflammatory changes. No pathologic dilatation of small bowel or colon. Numerous colonic diverticulae are noted, particularly in the sigmoid colon. Appendix is not confidently identified, likely surgically absent. Vascular/Lymphatic: Aortic atherosclerosis. No lymphadenopathy noted in the abdomen or pelvis. Reproductive: Status post hysterectomy. Ovaries are not confidently identified and may be surgically absent or atrophic. Other: Small volume of ascites.  No pneumoperitoneum. Musculoskeletal: Chronic appearing compression fracture of L4 with 70% loss of central vertebral body height. There are no aggressive appearing lytic or blastic lesions noted in the visualized portions of the skeleton. Postoperative changes of ORIF in the proximal right femur. IMPRESSION: 1. Inflammatory changes centered around the pancreas and duodenum, concerning for acute pancreatitis and/or acute duodenitis. 2. Small volume of ascites. 3. Large right and moderate left pleural effusions lying dependently with some associated passive atelectasis in the lung bases bilaterally. 4. Tiny 2-3 mm  nonobstructive calculi in the collecting systems of both kidneys. No ureteral stones or findings of urinary tract obstruction are noted at this time. 5. Mild cardiomegaly. 6. Aortic atherosclerosis, in addition to left main and 3 vessel coronary artery disease. 7. There are calcifications of the aortic valve and mitral annulus. Echocardiographic correlation for evaluation of potential valvular dysfunction may be warranted if clinically indicated. 8. Moderate to large hiatal hernia. 9. Additional incidental findings, as above. Electronically Signed   By: Vinnie Langton M.D.   On: 04/13/2019 16:20   Dg Chest Port 1 View  Result Date: 04/13/2019 CLINICAL DATA:  Shortness of breath, abdominal pain EXAM: PORTABLE CHEST 1 VIEW COMPARISON:  04/04/2019 FINDINGS: Slight interval increase in layering right pleural effusion and associated atelectasis or consolidation.  There may be an additional small left pleural effusion. Mild diffuse interstitial pulmonary opacity, likely edema, similar prior examination. No focal airspace opacity. Cardiomegaly. IMPRESSION: 1. Slight interval increase in layering right pleural effusion and associated atelectasis or consolidation. There may be an additional small left pleural effusion. 2. Mild diffuse interstitial pulmonary opacity, likely edema, similar to prior examination. No focal airspace opacity. 3.  Cardiomegaly. Electronically Signed   By: Eddie Candle M.D.   On: 04/13/2019 14:48    Cardiac Studies  1. Left ventricular ejection fraction, by visual estimation, is 70 to 75%. The left ventricle has hyperdynamic function. Decreased left ventricular size. There is moderately increased left ventricular hypertrophy. 2. Left ventricular diastolic Doppler parameters are indeterminate pattern of LV diastolic filling. 3. Right ventricular volume and pressure overload. 4. Global right ventricle has normal systolic function.The right ventricular size is moderately enlarged. Mildly  increased right ventricular wall thickness. 5. Left atrial size was severely dilated. 6. Right atrial size was normal. 7. Presence of pericardial fat pad. 8. Moderate aortic valve annular calcification. 9. Moderate to severe mitral annular calcification. 10. Severe calcification of the mitral valve leaflet(s). Valve appears stenotic, not completely evaluated. Suggest follow-up images with VTI measurements or planimetry if possible. 11. The mitral valve is degenerative. Mild mitral valve regurgitation. 12. The tricuspid valve is grossly normal. Tricuspid valve regurgitation is mild. 13. Aortic valve area, by VTI measures 1.83 cm. 14. Aortic valve mean gradient measures 8.8 mmHg. 15. The aortic valve is tricuspid Aortic valve regurgitation was not visualized by color flow Doppler. Mild aortic valve stenosis. 16. Moderately elevated pulmonary artery systolic pressure. 17. The tricuspid regurgitant velocity is 3.67 m/s, and with an assumed right atrial pressure of 8 mmHg, the estimated right ventricular systolic pressure is moderately elevated at 61.9 mmHg. 18. The inferior vena cava is normal in size with <50% respiratory variability, suggesting right atrial pressure of 8 mmHg. 19. The pulmonic valve was grossly normal. Pulmonic valve regurgitation is trivial by color flow Doppler.  Patient Profile     EMESE MYNATT a 85 y.o.femalewith past medical history of permanent atrial fibrillation (diagnosed in 11/2018), mitral stenosis and chronic back painwho is being seen today for the evaluation of atrial fibrillation with bradycardiaat the request of Dr. Luan Pulling.    Assessment & Plan    1.  Bradycardia   Home meds held on admit     HR improved   Back on meds  (Dilt 360 mg CD and metoprolol  25 bid   Follow   Continue hydration   2  Permanent atrial fib   Need to watch BP and HR     Remains on Eliquis  2.5 bid  Follow renal function    3   AKI  Cr 1.9 today  Was 0.6 in June      4  Hyperkalemia  K5.0 today     For questions or updates, please contact [facility] Please consult [email or web address] for contact info under        Signed, Dorris Carnes, MD  04/14/2019, 9:21 AM

## 2019-04-14 NOTE — Progress Notes (Signed)
PROGRESS NOTE    Kristy Mckinney  [id number] DOB: 1932-01-02 DOA: 04/10/2019 PCP: [name], MD    Brief Narrative:  85 year old female who was admitted to the hospital with probable viral gastroenteritis and severe dehydration with hyperkalemia and acute renal failure.  She was also bradycardic which was felt to be related to the hyperkalemia as well as her rate control medications for atrial fibrillation.  She was hydrated with IV fluids with improvement of renal function.  Rate control medication was initially held, but then resumed as heart rate improved.  Unfortunately, her GI symptoms persisted.  CT scan performed shows possible pancreatitis.  She is also developed shortness of breath and imaging indicates bilateral pleural effusions.  Further work-up in process.   Assessment & Plan:   Principal Problem:   Bradycardia Active Problems:   Dementia (HCC)   A-fib (HCC)   Hypotension   Hyperkalemia   ARF (acute renal failure) (HCC)   Pleural effusion   Pancreatitis, acute   Acute respiratory failure with hypoxia ([place])   1. Acute renal failure.  Related to volume depletion in the setting of GI losses.  Patient has been on IV fluids and overall improvement of renal function.  Renal ultrasound without obstructive uropathy fluids discontinued on 10/3, but creatinine has bumped back up again to 1.90 (from 1.0 on 04/12/2019) , baseline creatinine 0.8, creatinine on admission 2.0 -Last known EF over 70% okay to hydrate judiciously given worsening renal function again  Pt has evidence of pleural effusions, will be judicious with IV fluids encourage p.o. fluids.   2. Hyperkalemia.  Potassium was 6.7 on admission related to renal failure.  Was likely contributing to bradycardia as well too.  Potassium had initially normalized, but has trended back up -5.0 Received  1 dose of Lokelma.   3. Permanent atrial fibrillation --- patient had bradycardia initially on admission in the  setting of severe hyperkalemia.  Cardizem and metoprolol held on admission.  Overall hyperkalemia had also improved.  Cardizem was restarted and she is back on her home dose of 360 mg daily.  She was also restarted on her home dose of metoprolol   Continue anticoagulation for atrial fibrillation--cardiology input appreciated  4. Nausea and vomiting/abdominal pain/pancreatitis/duodenitis--CT abdomen and pelvis findings noted  -patient had nausea and abdominal discomfort with attempt to advance to full liquid diet,   She is already status post cholecystectomy.  Advance diet as tolerated   5. acute hypoxic respiratory failure secondary to bilateral pleural effusions.  Likely precipitated by IV fluid hydration for initial acute renal failure.  Continue to wean off oxygen as tolerated.  6. HFpEF--  Patient has evidence of pleural effusions.  Check BNP.  Most recent echocardiogram from 03/2019 shows ejection fraction of > 70%.  Will avoid Lasix at this time in the setting of renal failure/pancreatitis.   7. Bilateral pleural effusions.  Likely related to IV hydration.  Echocardiogram shows preserved ejection fraction.  Can consider thoracentesis if symptoms progress.   DVT prophylaxis: Apixaban Code Status: Full code Family Communication:  Dr Roderic Palau Discussed with daughter Disposition Plan: Discharge home once clinically improved   Consultants:   Cardiology  Procedures:  Echo:  1. Left ventricular ejection fraction, by visual estimation, is 70 to 75%. The left ventricle has hyperdynamic function. Decreased left ventricular size. There is moderately increased left ventricular hypertrophy.  2. Left ventricular diastolic Doppler parameters are indeterminate pattern of LV diastolic filling.  3. Right ventricular volume and pressure overload.  4.  Global right ventricle has normal systolic function.The right ventricular size is moderately enlarged. Mildly increased right ventricular wall thickness.  5.  Left atrial size was severely dilated.  6. Right atrial size was normal.  7. Presence of pericardial fat pad.  8. Moderate aortic valve annular calcification.  9. Moderate to severe mitral annular calcification. 10. Severe calcification of the mitral valve leaflet(s). Valve appears stenotic, not completely evaluated. Suggest follow-up images with VTI measurements or planimetry if possible. 11. The mitral valve is degenerative. Mild mitral valve regurgitation. 12. The tricuspid valve is grossly normal. Tricuspid valve regurgitation is mild. 13. Aortic valve area, by VTI measures 1.83 cm. 14. Aortic valve mean gradient measures 8.8 mmHg. 15. The aortic valve is tricuspid Aortic valve regurgitation was not visualized by color flow Doppler. Mild aortic valve stenosis. 16. Moderately elevated pulmonary artery systolic pressure. 17. The tricuspid regurgitant velocity is 3.67 m/s, and with an assumed right atrial pressure of 8 mmHg, the estimated right ventricular systolic pressure is moderately elevated at 61.9 mmHg. 18. The inferior vena cava is normal in size with <50% respiratory variability, suggesting right atrial pressure of 8 mmHg.  19. The pulmonic valve was grossly normal. Pulmonic valve regurgitation is trivial by color flow Doppler.  Antimicrobials:      Subjective: -Abdominal pain, nausea and discomfort after attempting to advance to full liquid diet  Objective: Vitals:   04/14/19 1500 04/14/19 1624 04/14/19 1642 04/14/19 1700  BP: 112/65  123/60 109/65  Pulse: (!) 52 (!) 56 62 64  Resp: 17 19 18 19   Temp:  (!) 97.5 F (36.4 C)    TempSrc:  Oral    SpO2: 97% 94% 95% 95%  Weight:      Height:        Intake/Output Summary (Last 24 hours) at 04/14/2019 1724 Last data filed at 04/14/2019 1545 Gross per 24 hour  Intake 294.13 ml  Output 3 ml  Net 291.13 ml   Filed Weights   04/12/19 0405 04/13/19 0500 04/14/19 0500  Weight: 70.1 kg 70.5 kg 71.2 kg     Examination:  General exam: Alert, awake, oriented x 3 Respiratory system: Diminished breath sounds at bases. Respiratory effort normal. Cardiovascular system: Irregularly irregular,. No murmurs, rubs, gallops. Gastrointestinal system: Abdomen is ND, soft and tender in periumbilical area.Normal bowel sounds heard. Central nervous system: Alert and oriented. No focal neurological deficits. Extremities: No C/C/E, +pedal pulses Skin: No rashes, lesions or ulcers Psychiatry: Judgement and insight appear normal. Mood & affect appropriate.      Data Reviewed:  CBC: Recent Labs  Lab 04/10/19 0104 04/11/19 0356 04/12/19 0452 04/14/19 0441  WBC 10.3 7.8 7.0 8.4  HGB 11.6* 10.2* 10.0* 10.6*  HCT 39.0 34.9* 35.0* 36.5  MCV 88.2 88.8 90.0 89.2  PLT 273 209 203 [id number]   Basic Metabolic Panel: Recent Labs  Lab 04/10/19 0357 04/11/19 0356 04/12/19 0452 04/13/19 0428 04/14/19 0441  NA 137 137 137 137 134*   134*  K 4.9 4.1 4.2 5.3* 5.0   5.1  CL 99 100 103 104 100   100  CO2 26 28 26 24 23   25   GLUCOSE 225* 104* 104* 116* 101*   101*  BUN 35* 40* 30* 34* 42*   43*  CREATININE 2.06* 2.00* 1.03* 1.54* 1.90*   1.91*  CALCIUM 9.0 8.7* 8.7* 8.9 9.0   9.0  MG  --   --  2.1  --   --   PHOS  --   --   --   --  3.4   GFR: Estimated Creatinine Clearance: 21.3 mL/min (A) (by C-G formula based on SCr of 1.9 mg/dL (H)). Liver Function Tests: Recent Labs  Lab 04/10/19 0357 04/11/19 0356 04/14/19 0441  AST 140* 84* 52*  ALT 100* 85* 69*  ALKPHOS 128* 104 94  BILITOT 2.4* 0.6 1.1  PROT 5.8* 5.9* 6.0*  ALBUMIN 3.2* 3.3* 3.3*   3.3*   Recent Labs  Lab 04/13/19 1834 04/14/19 0441  LIPASE 16 16   No results for input(s): AMMONIA in the last 168 hours. Coagulation Profile: No results for input(s): INR, PROTIME in the last 168 hours. Cardiac Enzymes: No results for input(s): CKTOTAL, CKMB, CKMBINDEX, TROPONINI in the last 168 hours. BNP (last 3 results) No results for  input(s): PROBNP in the last 8760 hours. HbA1C: No results for input(s): HGBA1C in the last 72 hours. CBG: Recent Labs  Lab 04/10/19 0051  GLUCAP 153*   Lipid Profile: No results for input(s): CHOL, HDL, LDLCALC, TRIG, CHOLHDL, LDLDIRECT in the last 72 hours. Thyroid Function Tests: No results for input(s): TSH, T4TOTAL, FREET4, T3FREE, THYROIDAB in the last 72 hours. Anemia Panel: No results for input(s): VITAMINB12, FOLATE, FERRITIN, TIBC, IRON, RETICCTPCT in the last 72 hours. Sepsis Labs: No results for input(s): PROCALCITON, LATICACIDVEN in the last 168 hours.  Recent Results (from the past 240 hour(s))  SARS CORONAVIRUS 2 (TAT 6-24 HRS) Nasopharyngeal Nasopharyngeal Swab     Status: None   Collection Time: 04/10/19  3:31 AM   Specimen: Nasopharyngeal Swab  Result Value Ref Range Status   SARS Coronavirus 2 NEGATIVE NEGATIVE Final    Comment: (NOTE) SARS-CoV-2 target nucleic acids are NOT DETECTED. The SARS-CoV-2 RNA is generally detectable in upper and lower respiratory specimens during the acute phase of infection. Negative results do not preclude SARS-CoV-2 infection, do not rule out co-infections with other pathogens, and should not be used as the sole basis for treatment or other patient management decisions. Negative results must be combined with clinical observations, patient history, and epidemiological information. The expected result is Negative. Fact Sheet for Patients: SugarRoll.be Fact Sheet for Healthcare Providers: https://www.woods-mathews.com/ This test is not yet approved or cleared by the [place] FDA and  has been authorized for detection and/or diagnosis of SARS-CoV-2 by FDA under an Emergency Use Authorization (EUA). This EUA will remain  in effect (meaning this test can be used) for the duration of the COVID-19 declaration under Section 56 4(b)(1) of the Act, 21 U.S.C. section 360bbb-3(b)(1), unless  the authorization is terminated or revoked sooner. Performed at [facility], [place] [address] [place], [place] [address]   MRSA PCR Screening     Status: None   Collection Time: 04/10/19 12:00 PM   Specimen: Nasopharyngeal  Result Value Ref Range Status   MRSA by PCR NEGATIVE NEGATIVE Final    Comment:        The GeneXpert MRSA Assay (FDA approved for NASAL specimens only), is one component of a comprehensive MRSA colonization surveillance program. It is not intended to diagnose MRSA infection nor to guide or monitor treatment for MRSA infections. Performed at [facility], [address], [place], [place] [address]          Radiology Studies: Ct Abdomen Pelvis Wo Contrast  Result Date: 04/13/2019 CLINICAL DATA:  85 year old female with history of abdominal distension and, nausea and vomiting. EXAM: CT ABDOMEN AND PELVIS WITHOUT CONTRAST TECHNIQUE: Multidetector CT imaging of the abdomen and pelvis was performed following the standard protocol without  IV contrast. COMPARISON:  No priors. FINDINGS: Lower chest: Large right and moderate left pleural effusions lying dependently with extensive areas of passive atelectasis in the lung bases bilaterally. Mild cardiomegaly. Atherosclerotic calcifications in the descending thoracic aorta as well as the left main, left anterior descending, left circumflex and right coronary arteries. Severe calcifications of the aortic valve and mitral annulus. Moderate to large hiatal hernia Hepatobiliary: No definite suspicious cystic or solid hepatic lesions are confidently identified on today's noncontrast CT examination. Status post cholecystectomy. Pancreas: No definite pancreatic mass. Mild peripancreatic stranding. Spleen: Trace amount of perisplenic fluid, otherwise unremarkable. Adrenals/Urinary Tract: 2 mm nonobstructive calculus in the upper pole collecting system of the right kidney and 3 mm nonobstructive calculus in the lower pole collecting  system of the left kidney. Unenhanced appearance of the kidneys and bilateral adrenal glands is otherwise unremarkable. No hydroureteronephrosis. Urinary bladder is nearly decompressed, but otherwise unremarkable in appearance. Stomach/Bowel: Normal appearance of the stomach. Mural thickening in the duodenum, most evident in the second portion of the duodenum, with surrounding inflammatory changes. No pathologic dilatation of small bowel or colon. Numerous colonic diverticulae are noted, particularly in the sigmoid colon. Appendix is not confidently identified, likely surgically absent. Vascular/Lymphatic: Aortic atherosclerosis. No lymphadenopathy noted in the abdomen or pelvis. Reproductive: Status post hysterectomy. Ovaries are not confidently identified and may be surgically absent or atrophic. Other: Small volume of ascites.  No pneumoperitoneum. Musculoskeletal: Chronic appearing compression fracture of L4 with 70% loss of central vertebral body height. There are no aggressive appearing lytic or blastic lesions noted in the visualized portions of the skeleton. Postoperative changes of ORIF in the proximal right femur. IMPRESSION: 1. Inflammatory changes centered around the pancreas and duodenum, concerning for acute pancreatitis and/or acute duodenitis. 2. Small volume of ascites. 3. Large right and moderate left pleural effusions lying dependently with some associated passive atelectasis in the lung bases bilaterally. 4. Tiny 2-3 mm nonobstructive calculi in the collecting systems of both kidneys. No ureteral stones or findings of urinary tract obstruction are noted at this time. 5. Mild cardiomegaly. 6. Aortic atherosclerosis, in addition to left main and 3 vessel coronary artery disease. 7. There are calcifications of the aortic valve and mitral annulus. Echocardiographic correlation for evaluation of potential valvular dysfunction may be warranted if clinically indicated. 8. Moderate to large hiatal  hernia. 9. Additional incidental findings, as above. Electronically Signed   By: Vinnie Langton M.D.   On: 04/13/2019 16:20   Dg Chest Port 1 View  Result Date: 04/13/2019 CLINICAL DATA:  Shortness of breath, abdominal pain EXAM: PORTABLE CHEST 1 VIEW COMPARISON:  04/04/2019 FINDINGS: Slight interval increase in layering right pleural effusion and associated atelectasis or consolidation. There may be an additional small left pleural effusion. Mild diffuse interstitial pulmonary opacity, likely edema, similar prior examination. No focal airspace opacity. Cardiomegaly. IMPRESSION: 1. Slight interval increase in layering right pleural effusion and associated atelectasis or consolidation. There may be an additional small left pleural effusion. 2. Mild diffuse interstitial pulmonary opacity, likely edema, similar to prior examination. No focal airspace opacity. 3.  Cardiomegaly. Electronically Signed   By: Eddie Candle M.D.   On: 04/13/2019 14:48        Scheduled Meds:  apixaban  2.5 mg Oral BID   Chlorhexidine Gluconate Cloth  6 each Topical Daily   diltiazem  360 mg Oral Daily   magnesium oxide  400 mg Oral TID   memantine  5 mg Oral BID   metoprolol tartrate  25 mg Oral BID   pantoprazole  40 mg Oral Daily   QUEtiapine  25 mg Oral QHS   sertraline  50 mg Oral q morning - 10a   sodium chloride flush  3 mL Intravenous Once   sodium chloride flush  3 mL Intravenous Q12H   Continuous Infusions:  sodium chloride 70 mL/hr at 04/14/19 1400     LOS: 4 days    Roxan Hockey, MD [facility]   If 7PM-7AM, please contact night-coverage [email or web address]  04/14/2019, 5:24 PM

## 2019-04-15 LAB — RENAL FUNCTION PANEL
Albumin: 3.3 g/dL — ABNORMAL LOW (ref 3.5–5.0)
Anion gap: 7 (ref 5–15)
BUN: 42 mg/dL — ABNORMAL HIGH (ref 8–23)
CO2: 24 mmol/L (ref 22–32)
Calcium: 8.7 mg/dL — ABNORMAL LOW (ref 8.9–10.3)
Chloride: 100 mmol/L (ref 98–111)
Creatinine, Ser: 1.89 mg/dL — ABNORMAL HIGH (ref 0.44–1.00)
GFR calc Af Amer: 28 mL/min — ABNORMAL LOW (ref >60)
GFR calc non Af Amer: 24 mL/min — ABNORMAL LOW (ref >60)
Glucose, Bld: 120 mg/dL — ABNORMAL HIGH (ref 70–99)
Phosphorus: 3.6 mg/dL (ref 2.5–4.6)
Potassium: 5.4 mmol/L — ABNORMAL HIGH (ref 3.5–5.1)
Sodium: 131 mmol/L — ABNORMAL LOW (ref 135–145)

## 2019-04-15 MED ORDER — SODIUM ZIRCONIUM CYCLOSILICATE 5 G PO PACK
5.0000 g | PACK | Freq: Once | ORAL | Status: AC
  Administered 2019-04-15: 5 g via ORAL
  Filled 2019-04-15: qty 1

## 2019-04-15 MED ORDER — FENTANYL CITRATE (PF) 100 MCG/2ML IJ SOLN
25.0000 ug | INTRAMUSCULAR | Status: DC | PRN
  Administered 2019-04-15 – 2019-04-19 (×4): 25 ug via INTRAVENOUS
  Filled 2019-04-15 (×4): qty 2

## 2019-04-15 MED ORDER — FENTANYL CITRATE (PF) 100 MCG/2ML IJ SOLN
12.5000 ug | INTRAMUSCULAR | Status: DC | PRN
  Administered 2019-04-15 (×2): 12.5 ug via INTRAVENOUS
  Filled 2019-04-15 (×2): qty 2

## 2019-04-15 MED ORDER — METOCLOPRAMIDE HCL 10 MG PO TABS
5.0000 mg | ORAL_TABLET | Freq: Three times a day (TID) | ORAL | Status: AC
  Administered 2019-04-15 – 2019-04-16 (×2): 5 mg via ORAL
  Filled 2019-04-15 (×2): qty 1

## 2019-04-15 NOTE — Progress Notes (Signed)
Administered PRN tylenol per patient request for abdominal pain at 2342. Pt just asked for something stronger. Paged Olevia Bowens for possible orders

## 2019-04-15 NOTE — Progress Notes (Signed)
PROGRESS NOTE    Kristy Mckinney  [id number] DOB: [birth year] DOA: 04/10/2019 PCP: [name], MD    Brief Narrative:  85 year old female who was admitted to the hospital with probable viral gastroenteritis and severe dehydration with hyperkalemia and acute renal failure.  She was also bradycardic which was felt to be related to the hyperkalemia as well as her rate control medications for atrial fibrillation.  She was hydrated with IV fluids with improvement of renal function.  Rate control medication was initially held, but then resumed as heart rate improved.  Unfortunately, her GI symptoms persisted.  CT scan performed shows possible pancreatitis.  She is also developed shortness of breath and imaging indicates bilateral pleural effusions.  Further work-up in process.   Assessment & Plan:   Principal Problem:   Bradycardia Active Problems:   Dementia (HCC)   A-fib (HCC)   Hypotension   Hyperkalemia   ARF (acute renal failure) (HCC)   Pleural effusion   Pancreatitis, acute   Acute respiratory failure with hypoxia ([place])   1. Acute renal failure.  Related to volume depletion in the setting of GI losses.  Patient has been on IV fluids and overall improvement of renal function.  Renal ultrasound without obstructive uropathy fluids discontinued on 10/3, but creatinine has bumped back up again to 1.89 (from 1.0 on 04/12/2019) , baseline creatinine 0.8, creatinine on admission 2.0 -Last known EF over 70% okay to hydrate judiciously given worsening renal function again  Pt has evidence of pleural effusions, will be judicious with IV fluids encourage p.o. fluids.   2. Hyperkalemia.  Potassium was 6.7 on admission related to renal failure.  Was likely contributing to bradycardia as well too.  Potassium had initially normalized, but has trended back up -5.4 will repeat 1 dose of Lokelma.   3. Permanent atrial fibrillation --- patient had bradycardia initially on admission in the  setting of severe hyperkalemia.  Cardizem and metoprolol held on admission.  Overall hyperkalemia had also improved.  Cardizem was restarted and she is back on her home dose of 360 mg daily.  She was also restarted on her home dose of metoprolol   Continue anticoagulation for atrial fibrillation--cardiology input appreciated  4. Nausea and vomiting/abdominal pain/pancreatitis/duodenitis--CT abdomen and pelvis findings noted  -patient had nausea and abdominal discomfort with attempt to advance to full liquid diet,   She is already status post cholecystectomy.  Advance diet as tolerated   5. acute hypoxic respiratory failure secondary to bilateral pleural effusions.  Likely precipitated by IV fluid hydration for initial acute renal failure.  Continue to wean off oxygen as tolerated.  6. HFpEF--  Patient has evidence of pleural effusions.  Check BNP.  Most recent echocardiogram from 03/2019 shows ejection fraction of > 70%.  Will avoid Lasix at this time in the setting of renal failure/pancreatitis.   7. Bilateral pleural effusions.  Likely related to IV hydration.  Echocardiogram shows preserved ejection fraction.  Can consider thoracentesis if symptoms progress.   DVT prophylaxis: Apixaban Code Status: Full code Family Communication:    Discussed with daughter Ms Leda Min Disposition Plan: Discharge home once clinically improved--- unable to discharge him at this time due to recurrent/persistent AKI with hyperkalemia in the setting of poor oral intake and persistent nausea   Consultants:   Cardiology  Procedures:  Echo:  1. Left ventricular ejection fraction, by visual estimation, is 70 to 75%. The left ventricle has hyperdynamic function. Decreased left ventricular size. There is moderately increased left  ventricular hypertrophy.  2. Left ventricular diastolic Doppler parameters are indeterminate pattern of LV diastolic filling.  3. Right ventricular volume and pressure overload.  4.  Global right ventricle has normal systolic function.The right ventricular size is moderately enlarged. Mildly increased right ventricular wall thickness.  5. Left atrial size was severely dilated.  6. Right atrial size was normal.  7. Presence of pericardial fat pad.  8. Moderate aortic valve annular calcification.  9. Moderate to severe mitral annular calcification. 10. Severe calcification of the mitral valve leaflet(s). Valve appears stenotic, not completely evaluated. Suggest follow-up images with VTI measurements or planimetry if possible. 11. The mitral valve is degenerative. Mild mitral valve regurgitation. 12. The tricuspid valve is grossly normal. Tricuspid valve regurgitation is mild. 13. Aortic valve area, by VTI measures 1.83 cm. 14. Aortic valve mean gradient measures 8.8 mmHg. 15. The aortic valve is tricuspid Aortic valve regurgitation was not visualized by color flow Doppler. Mild aortic valve stenosis. 16. Moderately elevated pulmonary artery systolic pressure. 17. The tricuspid regurgitant velocity is 3.67 m/s, and with an assumed right atrial pressure of 8 mmHg, the estimated right ventricular systolic pressure is moderately elevated at 61.9 mmHg. 18. The inferior vena cava is normal in size with <50% respiratory variability, suggesting right atrial pressure of 8 mmHg.  19. The pulmonic valve was grossly normal. Pulmonic valve regurgitation is trivial by color flow Doppler.  Antimicrobials:      Subjective: - Reluctant to eat and drink due to nausea and epigastric discomfort, no chest pains no palpitations no dizziness -Patient aware that her potassium and creatinine remain stubbornly high,  Objective: Vitals:   04/15/19 1000 04/15/19 1018 04/15/19 1021 04/15/19 1058  BP: 105/66 105/66 105/66   Pulse: (!) 59  69 79  Resp: 13   16  Temp:    97.9 F (36.6 C)  TempSrc:    Oral  SpO2: 97%   99%  Weight:      Height:        Intake/Output Summary (Last 24  hours) at 04/15/2019 1308 Last data filed at 04/15/2019 1019 Gross per 24 hour  Intake 1661.7 ml  Output 4 ml  Net 1657.7 ml   Filed Weights   04/12/19 0405 04/13/19 0500 04/14/19 0500  Weight: 70.1 kg 70.5 kg 71.2 kg    Examination:  General exam: Alert, awake, oriented x 3 Respiratory system: Diminished breath sounds at bases. Respiratory effort normal. Cardiovascular system: Irregularly irregular,. No murmurs, rubs, gallops. Gastrointestinal system: Abdomen is ND, soft and tender in periumbilical area.Normal bowel sounds heard. Central nervous system: Alert and oriented. No focal neurological deficits. Extremities: No C/C/E, +pedal pulses Skin: No rashes, lesions or ulcers Psychiatry: Judgement and insight appear normal. Mood & affect appropriate.      Data Reviewed:  CBC: Recent Labs  Lab 04/10/19 0104 04/11/19 0356 04/12/19 0452 04/14/19 0441  WBC 10.3 7.8 7.0 8.4  HGB 11.6* 10.2* 10.0* 10.6*  HCT 39.0 34.9* 35.0* 36.5  MCV 88.2 88.8 90.0 89.2  PLT 273 209 203 [id number]   Basic Metabolic Panel: Recent Labs  Lab 04/11/19 0356 04/12/19 0452 04/13/19 0428 04/14/19 0441 04/15/19 0435  NA 137 137 137 134*   134* 131*  K 4.1 4.2 5.3* 5.0   5.1 5.4*  CL 100 103 104 100   100 100  CO2 28 26 24 23   25 24   GLUCOSE 104* 104* 116* 101*   101* 120*  BUN 40* 30* 34* 42*   43*  42*  CREATININE 2.00* 1.03* 1.54* 1.90*   1.91* 1.89*  CALCIUM 8.7* 8.7* 8.9 9.0   9.0 8.7*  MG  --  2.1  --   --   --   PHOS  --   --   --  3.4 3.6   GFR: Estimated Creatinine Clearance: 21.4 mL/min (A) (by C-G formula based on SCr of 1.89 mg/dL (H)). Liver Function Tests: Recent Labs  Lab 04/10/19 0357 04/11/19 0356 04/14/19 0441 04/15/19 0435  AST 140* 84* 52*  --   ALT 100* 85* 69*  --   ALKPHOS 128* 104 94  --   BILITOT 2.4* 0.6 1.1  --   PROT 5.8* 5.9* 6.0*  --   ALBUMIN 3.2* 3.3* 3.3*   3.3* 3.3*   Recent Labs  Lab 04/13/19 1834 04/14/19 0441  LIPASE 16 16   No results for  input(s): AMMONIA in the last 168 hours. Coagulation Profile: No results for input(s): INR, PROTIME in the last 168 hours. Cardiac Enzymes: No results for input(s): CKTOTAL, CKMB, CKMBINDEX, TROPONINI in the last 168 hours. BNP (last 3 results) No results for input(s): PROBNP in the last 8760 hours. HbA1C: No results for input(s): HGBA1C in the last 72 hours. CBG: Recent Labs  Lab 04/10/19 0051  GLUCAP 153*   Lipid Profile: No results for input(s): CHOL, HDL, LDLCALC, TRIG, CHOLHDL, LDLDIRECT in the last 72 hours. Thyroid Function Tests: No results for input(s): TSH, T4TOTAL, FREET4, T3FREE, THYROIDAB in the last 72 hours. Anemia Panel: No results for input(s): VITAMINB12, FOLATE, FERRITIN, TIBC, IRON, RETICCTPCT in the last 72 hours. Sepsis Labs: No results for input(s): PROCALCITON, LATICACIDVEN in the last 168 hours.  Recent Results (from the past 240 hour(s))  SARS CORONAVIRUS 2 (TAT 6-24 HRS) Nasopharyngeal Nasopharyngeal Swab     Status: None   Collection Time: 04/10/19  3:31 AM   Specimen: Nasopharyngeal Swab  Result Value Ref Range Status   SARS Coronavirus 2 NEGATIVE NEGATIVE Final    Comment: (NOTE) SARS-CoV-2 target nucleic acids are NOT DETECTED. The SARS-CoV-2 RNA is generally detectable in upper and lower respiratory specimens during the acute phase of infection. Negative results do not preclude SARS-CoV-2 infection, do not rule out co-infections with other pathogens, and should not be used as the sole basis for treatment or other patient management decisions. Negative results must be combined with clinical observations, patient history, and epidemiological information. The expected result is Negative. Fact Sheet for Patients: SugarRoll.be Fact Sheet for Healthcare Providers: https://www.woods-mathews.com/ This test is not yet approved or cleared by the [place] FDA and  has been authorized for detection and/or  diagnosis of SARS-CoV-2 by FDA under an Emergency Use Authorization (EUA). This EUA will remain  in effect (meaning this test can be used) for the duration of the COVID-19 declaration under Section 56 4(b)(1) of the Act, 21 U.S.C. section 360bbb-3(b)(1), unless the authorization is terminated or revoked sooner. Performed at [facility], [place] [address], [place], [place] [address]   MRSA PCR Screening     Status: None   Collection Time: 04/10/19 12:00 PM   Specimen: Nasopharyngeal  Result Value Ref Range Status   MRSA by PCR NEGATIVE NEGATIVE Final    Comment:        The GeneXpert MRSA Assay (FDA approved for NASAL specimens only), is one component of a comprehensive MRSA colonization surveillance program. It is not intended to diagnose MRSA infection nor to guide or monitor treatment for MRSA infections. Performed at  [facility], [address], [place], [place] [address]          Radiology Studies: Ct Abdomen Pelvis Wo Contrast  Result Date: 04/13/2019 CLINICAL DATA:  85 year old female with history of abdominal distension and, nausea and vomiting. EXAM: CT ABDOMEN AND PELVIS WITHOUT CONTRAST TECHNIQUE: Multidetector CT imaging of the abdomen and pelvis was performed following the standard protocol without IV contrast. COMPARISON:  No priors. FINDINGS: Lower chest: Large right and moderate left pleural effusions lying dependently with extensive areas of passive atelectasis in the lung bases bilaterally. Mild cardiomegaly. Atherosclerotic calcifications in the descending thoracic aorta as well as the left main, left anterior descending, left circumflex and right coronary arteries. Severe calcifications of the aortic valve and mitral annulus. Moderate to large hiatal hernia Hepatobiliary: No definite suspicious cystic or solid hepatic lesions are confidently identified on today's noncontrast CT examination. Status post cholecystectomy. Pancreas: No definite pancreatic mass.  Mild peripancreatic stranding. Spleen: Trace amount of perisplenic fluid, otherwise unremarkable. Adrenals/Urinary Tract: 2 mm nonobstructive calculus in the upper pole collecting system of the right kidney and 3 mm nonobstructive calculus in the lower pole collecting system of the left kidney. Unenhanced appearance of the kidneys and bilateral adrenal glands is otherwise unremarkable. No hydroureteronephrosis. Urinary bladder is nearly decompressed, but otherwise unremarkable in appearance. Stomach/Bowel: Normal appearance of the stomach. Mural thickening in the duodenum, most evident in the second portion of the duodenum, with surrounding inflammatory changes. No pathologic dilatation of small bowel or colon. Numerous colonic diverticulae are noted, particularly in the sigmoid colon. Appendix is not confidently identified, likely surgically absent. Vascular/Lymphatic: Aortic atherosclerosis. No lymphadenopathy noted in the abdomen or pelvis. Reproductive: Status post hysterectomy. Ovaries are not confidently identified and may be surgically absent or atrophic. Other: Small volume of ascites.  No pneumoperitoneum. Musculoskeletal: Chronic appearing compression fracture of L4 with 70% loss of central vertebral body height. There are no aggressive appearing lytic or blastic lesions noted in the visualized portions of the skeleton. Postoperative changes of ORIF in the proximal right femur. IMPRESSION: 1. Inflammatory changes centered around the pancreas and duodenum, concerning for acute pancreatitis and/or acute duodenitis. 2. Small volume of ascites. 3. Large right and moderate left pleural effusions lying dependently with some associated passive atelectasis in the lung bases bilaterally. 4. Tiny 2-3 mm nonobstructive calculi in the collecting systems of both kidneys. No ureteral stones or findings of urinary tract obstruction are noted at this time. 5. Mild cardiomegaly. 6. Aortic atherosclerosis, in addition to  left main and 3 vessel coronary artery disease. 7. There are calcifications of the aortic valve and mitral annulus. Echocardiographic correlation for evaluation of potential valvular dysfunction may be warranted if clinically indicated. 8. Moderate to large hiatal hernia. 9. Additional incidental findings, as above. Electronically Signed   By: Vinnie Langton M.D.   On: 04/13/2019 16:20        Scheduled Meds:  apixaban  2.5 mg Oral BID   Chlorhexidine Gluconate Cloth  6 each Topical Daily   diltiazem  360 mg Oral Daily   magnesium oxide  400 mg Oral TID   memantine  5 mg Oral BID   metoCLOPramide  5 mg Oral TID AC   metoprolol tartrate  25 mg Oral BID   pantoprazole  40 mg Oral Daily   QUEtiapine  25 mg Oral QHS   sertraline  50 mg Oral q morning - 10a   sodium chloride flush  3 mL Intravenous Once   sodium chloride flush  3 mL Intravenous Q12H   Continuous Infusions:  sodium chloride 75 mL/hr at 04/15/19 1025     LOS: 5 days    Roxan Hockey, MD [facility]   If 7PM-7AM, please contact night-coverage [email or web address]  04/15/2019, 1:08 PM

## 2019-04-15 NOTE — Progress Notes (Signed)
Pt required two PRN doses of fentanyl. After the second dose at 0300, patient has rested comfortably.

## 2019-04-16 LAB — RENAL FUNCTION PANEL
Albumin: 3.3 g/dL — ABNORMAL LOW (ref 3.5–5.0)
Anion gap: 8 (ref 5–15)
BUN: 45 mg/dL — ABNORMAL HIGH (ref 8–23)
CO2: 24 mmol/L (ref 22–32)
Calcium: 8.8 mg/dL — ABNORMAL LOW (ref 8.9–10.3)
Chloride: 99 mmol/L (ref 98–111)
Creatinine, Ser: 2.02 mg/dL — ABNORMAL HIGH (ref 0.44–1.00)
GFR calc Af Amer: 26 mL/min — ABNORMAL LOW (ref >60)
GFR calc non Af Amer: 22 mL/min — ABNORMAL LOW (ref >60)
Glucose, Bld: 115 mg/dL — ABNORMAL HIGH (ref 70–99)
Phosphorus: 4 mg/dL (ref 2.5–4.6)
Potassium: 5 mmol/L (ref 3.5–5.1)
Sodium: 131 mmol/L — ABNORMAL LOW (ref 135–145)

## 2019-04-16 LAB — CBC
HCT: 38 % (ref 36.0–46.0)
Hemoglobin: 11.2 g/dL — ABNORMAL LOW (ref 12.0–15.0)
MCH: 26 pg (ref 26.0–34.0)
MCHC: 29.5 g/dL — ABNORMAL LOW (ref 30.0–36.0)
MCV: 88.4 fL (ref 80.0–100.0)
Platelets: 425 10*3/uL — ABNORMAL HIGH (ref 150–400)
RBC: 4.3 MIL/uL (ref 3.87–5.11)
RDW: 16.3 % — ABNORMAL HIGH (ref 11.5–15.5)
WBC: 10.9 10*3/uL — ABNORMAL HIGH (ref 4.0–10.5)
nRBC: 0.2 % (ref 0.0–0.2)

## 2019-04-16 LAB — GLUCOSE, CAPILLARY: Glucose-Capillary: 115 mg/dL — ABNORMAL HIGH (ref 70–99)

## 2019-04-16 NOTE — Plan of Care (Signed)
  Problem: Acute Rehab PT Goals(only PT should resolve) Goal: Pt Will Go Supine/Side To Sit Outcome: Progressing Flowsheets (Taken 04/16/2019 1603) Pt will go Supine/Side to Sit: with modified independence Goal: Patient Will Transfer Sit To/From Stand Outcome: Progressing Flowsheets (Taken 04/16/2019 1603) Patient will transfer sit to/from stand: with modified independence Goal: Pt Will Transfer Bed To Chair/Chair To Bed Outcome: Progressing Flowsheets (Taken 04/16/2019 1603) Pt will Transfer Bed to Chair/Chair to Bed: with modified independence Goal: Pt Will Ambulate Outcome: Progressing Flowsheets (Taken 04/16/2019 1603) Pt will Ambulate:  75 feet  with supervision  with rolling walker   4:04 PM, 04/16/19 Lonell Grandchild, MPT Physical Therapist with [facility] [phone] office [phone] mobile phone

## 2019-04-16 NOTE — Evaluation (Signed)
Physical Therapy Evaluation Patient Details Name: Kristy Mckinney MRN: [id number] DOB: 1932/07/08 Today's Date: 04/16/2019   History of Present Illness  Kristy Mckinney  is a 85 y.o. female, w persistent atrial fibrillation , mitral stenosis , CHF (diastolic), and chronic back pain, w recent admission where cardizem was increased from 240 to 360mg  po qday, and  Started on metoprolol 25mg  po bid, apparently presents with c/o n/v starting yesterday.    Clinical Impression  Patient has difficulty scooting up to EOB due to generalized weakness, limited to a few steps at bedside mostly due to c/o severe dizziness and not feeling well.  Patient's BP at 118/39 when standing, tolerated sitting for approximately 5 minutes before put back to bed due to dizziness not resolving - RN notified.  Patient will benefit from continued physical therapy in hospital and recommended venue below to increase strength, balance, endurance for safe ADLs and gait.    Follow Up Recommendations SNF;Supervision for mobility/OOB;Supervision/Assistance - 24 hour    Equipment Recommendations  None recommended by PT    Recommendations for Other Services       Precautions / Restrictions Precautions Precautions: Fall Restrictions Weight Bearing Restrictions: No      Mobility  Bed Mobility Overal bed mobility: Needs Assistance Bed Mobility: Supine to Sit;Sit to Supine     Supine to sit: Min assist Sit to supine: Min assist   General bed mobility comments: has most diffiuclty scooting to EOB  Transfers Overall transfer level: Needs assistance Equipment used: 1 person hand held assist Transfers: Stand Pivot Transfers;Squat Pivot Transfers   Stand pivot transfers: Min assist Squat pivot transfers: Min assist     General transfer comment: slow labored unsteady movement  Ambulation/Gait Ambulation/Gait assistance: Min assist;Mod assist Gait Distance (Feet): 5 Feet Assistive device: 1 person hand held  assist Gait Pattern/deviations: Decreased step length - right;Decreased step length - left;Decreased stride length Gait velocity: slow   General Gait Details: limited to 5-6 steps at bedside due to c/o feeling dizziness and unsteady on feet  Stairs            Wheelchair Mobility    Modified Rankin (Stroke Patients Only)       Balance Overall balance assessment: Needs assistance Sitting-balance support: Feet supported;No upper extremity supported Sitting balance-Leahy Scale: Fair Sitting balance - Comments: seated at EOB   Standing balance support: During functional activity;No upper extremity supported Standing balance-Leahy Scale: Poor Standing balance comment: fair/poor with hand held assist                             Pertinent Vitals/Pain Pain Assessment: No/denies pain    Home Living Family/patient expects to be discharged to:: Private residence Living Arrangements: Alone Available Help at Discharge: Family;Available PRN/intermittently Type of Home: Apartment Home Access: Level entry     Home Layout: One level Home Equipment: Cane - single point;Shower seat;Bedside commode;Walker - 2 wheels      Prior Function Level of Independence: Independent with assistive device(s)         Comments: household and short distanced community ambulator with RW/SPC PRN     Hand Dominance   Dominant Hand: Right    Extremity/Trunk Assessment   Upper Extremity Assessment Upper Extremity Assessment: Generalized weakness    Lower Extremity Assessment Lower Extremity Assessment: Generalized weakness    Cervical / Trunk Assessment Cervical / Trunk Assessment: Normal  Communication   Communication: No difficulties  Cognition Arousal/Alertness: Awake/alert  Behavior During Therapy: WFL for tasks assessed/performed Overall Cognitive Status: Within Functional Limits for tasks assessed                                        General  Comments      Exercises     Assessment/Plan    PT Assessment Patient needs continued PT services  PT Problem List Decreased strength;Decreased activity tolerance;Decreased balance;Decreased mobility       PT Treatment Interventions Balance training;Gait training;Stair training;Functional mobility training;Therapeutic activities;Therapeutic exercise;Patient/family education    PT Goals (Current goals can be found in the Care Plan section)  Acute Rehab PT Goals Patient Stated Goal: return home PT Goal Formulation: With patient/family Time For Goal Achievement: 04/30/19 Potential to Achieve Goals: Good    Frequency Min 3X/week   Barriers to discharge        Co-evaluation               AM-PAC PT "6 Clicks" Mobility  Outcome Measure Help needed turning from your back to your side while in a flat bed without using bedrails?: None Help needed moving from lying on your back to sitting on the side of a flat bed without using bedrails?: A Little Help needed moving to and from a bed to a chair (including a wheelchair)?: A Little Help needed standing up from a chair using your arms (e.g., wheelchair or bedside chair)?: A Little Help needed to walk in hospital room?: A Lot Help needed climbing 3-5 steps with a railing? : A Lot 6 Click Score: 17    End of Session Equipment Utilized During Treatment: Oxygen Activity Tolerance: Patient tolerated treatment well;Patient limited by fatigue Patient left: in bed;with call bell/phone within reach Nurse Communication: Mobility status PT Visit Diagnosis: Unsteadiness on feet (R26.81);Other abnormalities of gait and mobility (R26.89);Muscle weakness (generalized) (M62.81)    Time: [id number] PT Time Calculation (min) (ACUTE ONLY): 23 min   Charges:   PT Evaluation $PT Eval Moderate Complexity: 1 Mod PT Treatments $Therapeutic Activity: 23-37 mins        4:01 PM, 04/16/19 Lonell Grandchild, MPT Physical Therapist with  [facility] [phone] office [phone] mobile phone

## 2019-04-16 NOTE — Care Management Important Message (Signed)
Important Message  Patient Details  Name: Kristy Mckinney MRN: [id number] Date of Birth: Jun 07, 1932   Medicare Important Message Given:  Yes     Tommy Medal 04/16/2019, 1:33 PM

## 2019-04-16 NOTE — Progress Notes (Signed)
PROGRESS NOTE    Kristy Mckinney  [id number] DOB: 03-Dec-1931 DOA: 04/10/2019 PCP: [name], MD    Brief Narrative:  85 year old female who was admitted to the hospital with probable viral gastroenteritis and severe dehydration with hyperkalemia and acute renal failure.  She was also bradycardic which was felt to be related to the hyperkalemia as well as her rate control medications for atrial fibrillation.  She was hydrated with IV fluids with improvement of renal function.  Rate control medication was initially held, but then resumed as heart rate improved.  Unfortunately, her GI symptoms persisted.  CT scan performed shows possible pancreatitis.  She is also developed shortness of breath and imaging indicates bilateral pleural effusions.  Further work-up in process.   Assessment & Plan:   Principal Problem:   Bradycardia Active Problems:   Dementia (HCC)   A-fib (HCC)   Hypotension   Hyperkalemia   ARF (acute renal failure) (HCC)   Pleural effusion   Pancreatitis, acute   Acute respiratory failure with hypoxia (HCC)   1)Acute renal failure.  Related to volume depletion in the setting of GI losses.  Patient has been on IV fluids and overall improvement of renal function.  Renal ultrasound and CT abdomen and pelvis without obstructive uropathy fluids discontinued on 10/3, but creatinine has bumped back up again to 2.02 (from 1.0 on 04/12/2019) , baseline creatinine 0.8, creatinine on admission 2.0 -Last known EF over 70%   Pt has evidence of pleural effusions, will STOP IV fluids, encourage p.o. fluids. -Nephrology consult appreciated -Concerns about reduced urine output, ??  Oliguric with recurrent hyperkalemia   2)Hyperkalemia.  Potassium was 6.7 on admission related to renal failure.  Was likely contributing to bradycardia as well too.  Potassium is back down to 5.0 after another dose of Lokelma  3)Permanent atrial fibrillation --- patient had bradycardia initially  on admission in the setting of severe hyperkalemia.  Cardizem and metoprolol held on admission.  Overall hyperkalemia had also improved.  Cardizem was restarted and she is back on her home dose of 360 mg daily.  She was also restarted on her home dose of metoprolol   Continue anticoagulation for atrial fibrillation--cardiology input appreciated  4)Nausea and vomiting/abdominal pain/pancreatitis/duodenitis--CT abdomen and pelvis findings noted  --Tolerating diet better,,   She is already status post cholecystectomy.  Advance diet as tolerated  -Encourage soft diet  5)acute hypoxic respiratory failure secondary to bilateral pleural effusions.  Likely precipitated by IV fluid hydration for initial acute renal failure.  Continue to wean off oxygen as tolerated.  6)HFpEF--  Patient has evidence of pleural effusions.  Check BNP.  Most recent echocardiogram from 03/2019 shows ejection fraction of > 70%.  Will avoid Lasix at this time in the setting of renal failure/pancreatitis.  Bilateral pleural effusions.  Likely related to IV hydration.  Echocardiogram shows preserved ejection fraction.  Can consider thoracentesis if symptoms progress. -IV fluids has been discontinued  7) generalized weakness and debility--physical therapy evaluation pending   DVT prophylaxis: Apixaban Code Status: Full code Family Communication:    Discussed with daughter Ms Leda Min again today Disposition Plan: Discharge home once clinically improved--- unable to discharge  at this time due to recurrent/persistent AKI with hyperkalemia in the setting of poor oral intake and persistent nausea -Oliguric with recurrent hyperkalemia    Consultants:   Cardiology  Procedures:  Echo:  1. Left ventricular ejection fraction, by visual estimation, is 70 to 75%. The left ventricle has hyperdynamic function. Decreased  left ventricular size. There is moderately increased left ventricular hypertrophy.  2. Left ventricular  diastolic Doppler parameters are indeterminate pattern of LV diastolic filling.  3. Right ventricular volume and pressure overload.  4. Global right ventricle has normal systolic function.The right ventricular size is moderately enlarged. Mildly increased right ventricular wall thickness.  5. Left atrial size was severely dilated.  6. Right atrial size was normal.  7. Presence of pericardial fat pad.  8. Moderate aortic valve annular calcification.  9. Moderate to severe mitral annular calcification. 10. Severe calcification of the mitral valve leaflet(s). Valve appears stenotic, not completely evaluated. Suggest follow-up images with VTI measurements or planimetry if possible. 11. The mitral valve is degenerative. Mild mitral valve regurgitation. 12. The tricuspid valve is grossly normal. Tricuspid valve regurgitation is mild. 13. Aortic valve area, by VTI measures 1.83 cm. 14. Aortic valve mean gradient measures 8.8 mmHg. 15. The aortic valve is tricuspid Aortic valve regurgitation was not visualized by color flow Doppler. Mild aortic valve stenosis. 16. Moderately elevated pulmonary artery systolic pressure. 17. The tricuspid regurgitant velocity is 3.67 m/s, and with an assumed right atrial pressure of 8 mmHg, the estimated right ventricular systolic pressure is moderately elevated at 61.9 mmHg. 18. The inferior vena cava is normal in size with <50% respiratory variability, suggesting right atrial pressure of 8 mmHg.  19. The pulmonic valve was grossly normal. Pulmonic valve regurgitation is trivial by color flow Doppler.  Antimicrobials:      Subjective: - -Generalized weakness, fatigue, trying to eat and drink more, concerns about reduced urine output  Objective: Vitals:   04/15/19 2300 04/16/19 0000 04/16/19 0400 04/16/19 0500  BP:  98/66 128/60   Pulse:  (!) 47 (!) 52   Resp:  13 14   Temp: 98.1 F (36.7 C)   (!) 97.4 F (36.3 C)  TempSrc: Oral   Oral  SpO2:  99% 96%     Weight:    74.9 kg  Height:        Intake/Output Summary (Last 24 hours) at 04/16/2019 1157 Last data filed at 04/15/2019 1431 Gross per 24 hour  Intake 311.38 ml  Output --  Net 311.38 ml   Filed Weights   04/13/19 0500 04/14/19 0500 04/16/19 0500  Weight: 70.5 kg 71.2 kg 74.9 kg    Examination:  General exam: Alert, awake, oriented x 3 Respiratory system: Diminished breath sounds at bases. Respiratory effort normal. Cardiovascular system: Irregularly irregular,. No murmurs, rubs, gallops. Gastrointestinal system: Abdomen is ND, soft and tender in periumbilical area.Normal bowel sounds heard. Central nervous system: Alert and oriented. No focal neurological deficits. Extremities: No C/C/E, +pedal pulses Skin: No rashes, lesions or ulcers Psychiatry: Judgement and insight appear normal. Mood & affect appropriate.   Data Reviewed:  CBC: Recent Labs  Lab 04/10/19 0104 04/11/19 0356 04/12/19 0452 04/14/19 0441 04/16/19 0456  WBC 10.3 7.8 7.0 8.4 10.9*  HGB 11.6* 10.2* 10.0* 10.6* 11.2*  HCT 39.0 34.9* 35.0* 36.5 38.0  MCV 88.2 88.8 90.0 89.2 88.4  PLT 273 209 203 287 [id number]*   Basic Metabolic Panel: Recent Labs  Lab 04/12/19 0452 04/13/19 0428 04/14/19 0441 04/15/19 0435 04/16/19 0456  NA 137 137 134*   134* 131* 131*  K 4.2 5.3* 5.0   5.1 5.4* 5.0  CL 103 104 100   100 100 99  CO2 26 24 23   25 24 24   GLUCOSE 104* 116* 101*   101* 120* 115*  BUN 30* 34* 42*  43* 42* 45*  CREATININE 1.03* 1.54* 1.90*   1.91* 1.89* 2.02*  CALCIUM 8.7* 8.9 9.0   9.0 8.7* 8.8*  MG 2.1  --   --   --   --   PHOS  --   --  3.4 3.6 4.0   GFR: Estimated Creatinine Clearance: 20.6 mL/min (A) (by C-G formula based on SCr of 2.02 mg/dL (H)). Liver Function Tests: Recent Labs  Lab 04/10/19 0357 04/11/19 0356 04/14/19 0441 04/15/19 0435 04/16/19 0456  AST 140* 84* 52*  --   --   ALT 100* 85* 69*  --   --   ALKPHOS 128* 104 94  --   --   BILITOT 2.4* 0.6 1.1  --   --   PROT  5.8* 5.9* 6.0*  --   --   ALBUMIN 3.2* 3.3* 3.3*   3.3* 3.3* 3.3*   Recent Labs  Lab 04/13/19 1834 04/14/19 0441  LIPASE 16 16   No results for input(s): AMMONIA in the last 168 hours. Coagulation Profile: No results for input(s): INR, PROTIME in the last 168 hours. Cardiac Enzymes: No results for input(s): CKTOTAL, CKMB, CKMBINDEX, TROPONINI in the last 168 hours. BNP (last 3 results) No results for input(s): PROBNP in the last 8760 hours. HbA1C: No results for input(s): HGBA1C in the last 72 hours. CBG: Recent Labs  Lab 04/10/19 0051  GLUCAP 153*   Lipid Profile: No results for input(s): CHOL, HDL, LDLCALC, TRIG, CHOLHDL, LDLDIRECT in the last 72 hours. Thyroid Function Tests: No results for input(s): TSH, T4TOTAL, FREET4, T3FREE, THYROIDAB in the last 72 hours. Anemia Panel: No results for input(s): VITAMINB12, FOLATE, FERRITIN, TIBC, IRON, RETICCTPCT in the last 72 hours. Sepsis Labs: No results for input(s): PROCALCITON, LATICACIDVEN in the last 168 hours.  Recent Results (from the past 240 hour(s))  SARS CORONAVIRUS 2 (TAT 6-24 HRS) Nasopharyngeal Nasopharyngeal Swab     Status: None   Collection Time: 04/10/19  3:31 AM   Specimen: Nasopharyngeal Swab  Result Value Ref Range Status   SARS Coronavirus 2 NEGATIVE NEGATIVE Final    Comment: (NOTE) SARS-CoV-2 target nucleic acids are NOT DETECTED. The SARS-CoV-2 RNA is generally detectable in upper and lower respiratory specimens during the acute phase of infection. Negative results do not preclude SARS-CoV-2 infection, do not rule out co-infections with other pathogens, and should not be used as the sole basis for treatment or other patient management decisions. Negative results must be combined with clinical observations, patient history, and epidemiological information. The expected result is Negative. Fact Sheet for Patients: SugarRoll.be Fact Sheet for Healthcare  Providers: https://www.woods-mathews.com/ This test is not yet approved or cleared by the [place] FDA and  has been authorized for detection and/or diagnosis of SARS-CoV-2 by FDA under an Emergency Use Authorization (EUA). This EUA will remain  in effect (meaning this test can be used) for the duration of the COVID-19 declaration under Section 56 4(b)(1) of the Act, 21 U.S.C. section 360bbb-3(b)(1), unless the authorization is terminated or revoked sooner. Performed at [facility], [place] [address] [place], [place] [address]   MRSA PCR Screening     Status: None   Collection Time: 04/10/19 12:00 PM   Specimen: Nasopharyngeal  Result Value Ref Range Status   MRSA by PCR NEGATIVE NEGATIVE Final    Comment:        The GeneXpert MRSA Assay (FDA approved for NASAL specimens only), is one component of a comprehensive MRSA colonization surveillance program. It  is not intended to diagnose MRSA infection nor to guide or monitor treatment for MRSA infections. Performed at [facility], [address], [place], [place] [address]      Radiology Studies: No results found.  Scheduled Meds:  apixaban  2.5 mg Oral BID   Chlorhexidine Gluconate Cloth  6 each Topical Daily   diltiazem  360 mg Oral Daily   magnesium oxide  400 mg Oral TID   memantine  5 mg Oral BID   metoCLOPramide  5 mg Oral TID AC   metoprolol tartrate  25 mg Oral BID   pantoprazole  40 mg Oral Daily   QUEtiapine  25 mg Oral QHS   sertraline  50 mg Oral q morning - 10a   sodium chloride flush  3 mL Intravenous Once   sodium chloride flush  3 mL Intravenous Q12H   Continuous Infusions:    LOS: 6 days    Roxan Hockey, MD [facility]   If 7PM-7AM, please contact night-coverage [email or web address]  04/16/2019, 11:57 AM

## 2019-04-16 NOTE — Consult Note (Signed)
[place] Date: 04/10/2019 04/16/2019 Rexene Agent Requesting Physician:  Denton Brick  Reason for Consult:  AKI HPI:  85 year old female admitted 10/1 after presenting with nausea/vomiting and found to have hypotension, bradycardia, and acute renal failure with hyperkalemia.  Potassium was 6.7 and thought to be the etiology of her slow ventricular response in the setting of permanent atrial fibrillation.  She was treated medically, potassium improved.  She has a normal GFR but presenting creatinine is 2.01 which improved over 48 hours back towards normal but since then has worsened again to a value of 2.02 today.  Imaging at presentation without obstruction or structural renal issues.  Medication at home do not last NSAIDs, ACE inhibitor/ARB, diuretics.  No reported rashes.  No urine analysis for review.  Patient denies dyspnea, chest pain.  No significant peripheral edema.  Urine is being captured by a purewick, but not adequately quantified and not completely captured.  Recorded weights are increased by 12 kg from presentation.  She denies anorexia, nausea/vomiting, dysgeusia.  PMH Incudes:  A. fib, permanent, on apixaban, metoprolol, diltiazem  Diastolic heart failure  Mitral stenosis  Chronic pain  Chronic PPI therapy  Osteoporosis on bisphosphonate   Creatinine, Ser (mg/dL)  Date Value  04/16/2019 2.02 (H)  04/15/2019 1.89 (H)  04/14/2019 1.90 (H)  04/14/2019 1.91 (H)  04/13/2019 1.54 (H)  04/12/2019 1.03 (H)  04/11/2019 2.00 (H)  04/10/2019 2.06 (H)  04/10/2019 2.01 (H)  04/05/2019 0.85  ]  ROS NSAIDS: No identified exposure IV Contrast no identified exposure TMP/SMX not used Hypotension present at admission, improved Balance of 12 systems is negative w/ exceptions as above  PMH  Past Medical History:  Diagnosis Date  . Atrial fibrillation ([place])   . Chronic back pain   . Diarrhea   . Dysphagia 05/03/2016  . GERD (gastroesophageal reflux disease)     PSH  Past Surgical History:  Procedure Laterality Date  . ABDOMINAL HYSTERECTOMY    . BACK SURGERY    . CHOLECYSTECTOMY N/A 01/29/2014   Procedure: LAPAROSCOPIC CHOLECYSTECTOMY;  Surgeon: Scherry Ran, MD;  Location: [facility];  Service: General;  Laterality: N/A;  . COLONOSCOPY N/A 10/27/2015   Procedure: COLONOSCOPY;  Surgeon: Rogene Houston, MD;  Location: [facility];  Service: Endoscopy;  Laterality: N/A;  215  . INTRAMEDULLARY (IM) NAIL INTERTROCHANTERIC Right 11/29/2018   Procedure: INTRAMEDULLARY (IM) NAIL INTERTROCHANTRIC FRACTURE;  Surgeon: Renette Butters, MD;  Location: [facility];  Service: Orthopedics;  Laterality: Right;  . TONSILLECTOMY    . YAG LASER APPLICATION Left [id number]   Procedure: YAG LASER APPLICATION;  Surgeon: Elta Guadeloupe T. Gershon Crane, MD;  Location: [facility];  Service: Ophthalmology;  Laterality: Left;  left   FH  Family History  Problem Relation Age of Onset  . Hypertension Child    SH  reports that she has never smoked. She has never used smokeless tobacco. She reports that she does not drink alcohol or use drugs. Allergies  Allergies  Allergen Reactions  . Penicillins Anaphylaxis  . Emetrol Nausea Only    swelling  . Feldene [Piroxicam] Nausea And Vomiting  . Sulfa Antibiotics     swelling   Home medications Prior to Admission medications   Medication Sig Start Date End Date Taking? Authorizing Provider  acetaminophen (TYLENOL) 325 MG tablet Take 650 mg by mouth every 6 (six) hours as needed. 12/05/18  Yes [provider]  alendronate (FOSAMAX) 70 MG tablet Take 1 tablet (70 mg total) by mouth  once a week. Take with a full glass of water on an empty stomach. 12/23/18  Yes Gerlene Fee, NP  apixaban (ELIQUIS) 5 MG TABS tablet Take 1 tablet (5 mg total) by mouth 2 (two) times daily. 04/07/19  Yes [name], MD  Balsam [place]-Castor Oil [facility]) OINT Apply topically to sacrum and bilateral buttocks every shift and as needed for prevention  12/06/18  Yes [provider]  calcium-vitamin D (OS-CAL 500 + D) 500-200 MG-UNIT tablet Take 1 tablet by mouth 2 (two) times daily. 12/05/18  Yes Oretha Milch D, MD  diltiazem (CARDIZEM CD) 360 MG 24 hr capsule Take 1 capsule (360 mg total) by mouth daily. 04/07/19  Yes [name], MD  magnesium oxide (MAG-OX) 400 MG tablet Take 1 tablet (400 mg total) by mouth 3 (three) times daily. 12/23/18  Yes Gerlene Fee, NP  memantine (NAMENDA) 5 MG tablet Take 1 tablet (5 mg total) by mouth 2 (two) times daily. 12/23/18  Yes Gerlene Fee, NP  metoprolol tartrate (LOPRESSOR) 25 MG tablet Take 1 tablet (25 mg total) by mouth 2 (two) times daily. 04/08/19  Yes [name], MD  Multiple Vitamin (MULTIVITAMIN WITH MINERALS) TABS tablet Take 1 tablet by mouth daily. 12/05/18  Yes Desiree Hane, MD  NON FORMULARY Diet Type: NAS 12/05/18  Yes [provider]  omeprazole (PRILOSEC) 20 MG capsule Take 1 capsule (20 mg total) by mouth every morning. 12/05/18  Yes Desiree Hane, MD  Ostomy Supplies (SKIN PREP WIPES) MISC Apply to bilateral heels every shift for prevention 12/06/18  Yes [provider]  Polyethyl Glycol-Propyl Glycol (SYSTANE OP) Place 1 drop into both eyes daily as needed. Dry Eyes    Yes [provider]  QUEtiapine (SEROQUEL) 25 MG tablet Take 1 tablet (25 mg total) by mouth at bedtime. 12/23/18  Yes Gerlene Fee, NP  sertraline (ZOLOFT) 50 MG tablet Take 1 tablet (50 mg total) by mouth every morning. 12/23/18  Yes Gerlene Fee, NP  diphenoxylate-atropine (LOMOTIL) 2.5-0.025 MG tablet Take 1 tablet by mouth 4 (four) times daily as needed for diarrhea or loose stools. Patient not taking: Reported on 02/28/2019 12/23/18   Gerlene Fee, NP  docusate sodium (COLACE) 100 MG capsule Take 1 capsule (100 mg total) by mouth 2 (two) times daily. Patient not taking: Reported on 02/28/2019 12/05/18   Oretha Milch D, MD  feeding supplement, ENSURE ENLIVE,  (ENSURE ENLIVE) LIQD Take 237 mLs by mouth 2 (two) times daily between meals. Patient not taking: Reported on 02/28/2019 12/05/18   Oretha Milch D, MD  polyethylene glycol (MIRALAX / GLYCOLAX) 17 g packet Take 17 g by mouth daily as needed for mild constipation. Patient not taking: Reported on 02/28/2019 12/05/18   Oretha Milch D, MD    Current Medications Scheduled Meds: . apixaban  2.5 mg Oral BID  . Chlorhexidine Gluconate Cloth  6 each Topical Daily  . diltiazem  360 mg Oral Daily  . magnesium oxide  400 mg Oral TID  . memantine  5 mg Oral BID  . metoCLOPramide  5 mg Oral TID AC  . metoprolol tartrate  25 mg Oral BID  . pantoprazole  40 mg Oral Daily  . QUEtiapine  25 mg Oral QHS  . sertraline  50 mg Oral q morning - 10a  . sodium chloride flush  3 mL Intravenous Once  . sodium chloride flush  3 mL Intravenous Q12H   Continuous Infusions: . sodium chloride  30 mL/hr at 04/15/19 1431   PRN Meds:.acetaminophen **OR** acetaminophen, diltiazem, diphenoxylate-atropine, fentaNYL (SUBLIMAZE) injection, guaiFENesin-dextromethorphan, ondansetron (ZOFRAN) IV, ondansetron, polyethylene glycol, polyvinyl alcohol  CBC Recent Labs  Lab 04/12/19 0452 04/14/19 0441 04/16/19 0456  WBC 7.0 8.4 10.9*  HGB 10.0* 10.6* 11.2*  HCT 35.0* 36.5 38.0  MCV 90.0 89.2 88.4  PLT 203 287 [id number]*   Basic Metabolic Panel Recent Labs  Lab 04/10/19 0357 04/11/19 0356 04/12/19 0452 04/13/19 0428 04/14/19 0441 04/15/19 0435 04/16/19 0456  NA 137 137 137 137 134*  134* 131* 131*  K 4.9 4.1 4.2 5.3* 5.0  5.1 5.4* 5.0  CL 99 100 103 104 100  100 100 99  CO2 26 28 26 24 23  25 24 24   GLUCOSE 225* 104* 104* 116* 101*  101* 120* 115*  BUN 35* 40* 30* 34* 42*  43* 42* 45*  CREATININE 2.06* 2.00* 1.03* 1.54* 1.90*  1.91* 1.89* 2.02*  CALCIUM 9.0 8.7* 8.7* 8.9 9.0  9.0 8.7* 8.8*  PHOS  --   --   --   --  3.4 3.6 4.0    Physical Exam  Blood pressure 128/60, pulse (!) 52, temperature (!) 97.4 F  (36.3 C), temperature source Oral, resp. rate 14, height 5\' 4"  (1.626 m), weight 74.9 kg, SpO2 96 %. GEN: Elderly, awake, alert, conversant ENT: NCAT EYES: EOMI CV: Slow, irregular, no murmur or rub PULM: Diminished in the bases, no crackles ABD: Soft, nontender, bowel sounds present SKIN: No rashes or lesions EXT: No lower extremity edema   Assessment 40F with AKI after presenting with nausea/vomiting, hypotension, bradycardia and hyperkalemia.  Potassium has normalized.  She had initial improvement in serum creatinine and now has worsened back to presenting values.  I suspect that she had an initial prerenal insult and has progressed to ATN.  It is unclear how much urine she is making, potentially very little; but if anuric I would expect her renal labs to be changing for the worse more quickly.  1. AKI, baseline nl GFR; suspect ATN after initial hypovolemic insult 2. Hyperkalemia, resolved 3. AFib with SVR< Cardiology / Primary managing. Stable 4. N/V, CT A/P suggested pancreatitis/duodenitis; lipase WNL, per primary 5. DCHF, stabl ecurrently  Plan 1. Stop IVFs, I think has achieved euvolemia, taking PO reasonably 2. Try to capture full UOP, d/w RN 3. Check UA 4. Daily weights, Daily Renal Panel, Strict I/Os, Avoid nephrotoxins (NSAIDs, judicious IV Contrast) 5. Will cont to closely follow   Rexene Agent  [id number] pgr 04/16/2019, 10:07 AM

## 2019-04-17 DIAGNOSIS — Z7189 Other specified counseling: Secondary | ICD-10-CM

## 2019-04-17 DIAGNOSIS — I4891 Unspecified atrial fibrillation: Secondary | ICD-10-CM

## 2019-04-17 DIAGNOSIS — R531 Weakness: Secondary | ICD-10-CM

## 2019-04-17 DIAGNOSIS — Z515 Encounter for palliative care: Secondary | ICD-10-CM

## 2019-04-17 LAB — CBC
HCT: 37.4 % (ref 36.0–46.0)
Hemoglobin: 11.1 g/dL — ABNORMAL LOW (ref 12.0–15.0)
MCH: 25.8 pg — ABNORMAL LOW (ref 26.0–34.0)
MCHC: 29.7 g/dL — ABNORMAL LOW (ref 30.0–36.0)
MCV: 86.8 fL (ref 80.0–100.0)
Platelets: 423 10*3/uL — ABNORMAL HIGH (ref 150–400)
RBC: 4.31 MIL/uL (ref 3.87–5.11)
RDW: 16.4 % — ABNORMAL HIGH (ref 11.5–15.5)
WBC: 11.1 10*3/uL — ABNORMAL HIGH (ref 4.0–10.5)
nRBC: 0.2 % (ref 0.0–0.2)

## 2019-04-17 LAB — RENAL FUNCTION PANEL
Albumin: 3.4 g/dL — ABNORMAL LOW (ref 3.5–5.0)
Anion gap: 8 (ref 5–15)
BUN: 48 mg/dL — ABNORMAL HIGH (ref 8–23)
CO2: 24 mmol/L (ref 22–32)
Calcium: 9.1 mg/dL (ref 8.9–10.3)
Chloride: 99 mmol/L (ref 98–111)
Creatinine, Ser: 1.87 mg/dL — ABNORMAL HIGH (ref 0.44–1.00)
GFR calc Af Amer: 28 mL/min — ABNORMAL LOW (ref >60)
GFR calc non Af Amer: 24 mL/min — ABNORMAL LOW (ref >60)
Glucose, Bld: 117 mg/dL — ABNORMAL HIGH (ref 70–99)
Phosphorus: 4.2 mg/dL (ref 2.5–4.6)
Potassium: 5.1 mmol/L (ref 3.5–5.1)
Sodium: 131 mmol/L — ABNORMAL LOW (ref 135–145)

## 2019-04-17 NOTE — Consult Note (Signed)
Consultation Note Date: 04/17/2019   Patient Name: Kristy Mckinney  DOB: [birth year]  MRN: [id number]  Age / Sex: 85 y.o., female  PCP: [name], MD Referring Physician: Roxan Hockey, MD  Reason for Consultation: Establishing goals of care  HPI/Patient Profile: 85 y.o. female  with past medical history of afib, chronic back pain, GERD, hip fx with recent surgery admitted on 04/10/2019 with low blood pressure and low heart rate. Admitted with probable viral gastroenteritis and severe dehydration with hyperkalemia and acute renal failure. CT scan revealed possible pancreatitis. Also with bilateral pleural effusions. Palliative medicine consultation requested by [facility] provider for goals of care.   Clinical Assessment and Goals of Care:  I have reviewed medical records, discussed with Dr. Denton Brick, and met with patient and daughter [name]) at bedside to discuss [place] and EOL wishes.   Introduced Palliative Medicine as specialized medical care for people living with serious illness. It focuses on providing relief from the symptoms and stress of a serious illness. The goal is to improve quality of life for both the patient and the family.  We discussed a brief life review of the patient. Prior to hospitalization, patient remained independent and active. Still driving. She unfortunately has had 3 hospitalizations this year, one for afib, then fall s/p hip fracture and repair, and current admission. After hip fracture and surgery, she discharged to rehab and was able to return home independently. Widowed 5 years ago.   Discussed course of hospitalization including diagnoses, interventions, and plan of care.   Patient is feeling better today but does tell [name] and I that she feels "weak." She was able to walk with physical therapy today. Patient is very motivate to work with therapy and discharge to skilled nursing  facility if necessary for more rehab. She is hopeful to get home to her apartment after rehab. [name] is retired and readily available but cannot spend 24/7 with her mother. She is agreeable to short term rehab if recommended by PT and attending. Reviewed PT recommendations.   I attempted to elicit values and goals of care important to the patient. Advanced directives, concepts specific to code status, artifical feeding and hydration, and rehospitalization were considered and discussed. [name] is in contact with an attorney to complete healthcare and durable POA paperwork. The documentation is already written up, just needs to be signed and notarized.   Introduced and discussed MOST form. Patient and daughter are ready to complete today. Patient shares her desire for resuscitation to be attempted "try a time or two" and if she does not survive, to let her go. Patient does not wish for prolonged, heroic interventions including life support or feeding tube. MOST form completed with patients current desire for FULL code, full scope treatment, antibiotics if life can be prolonged, IV fluids for defined trial period, and NO feeding tube. Copies made for chart and daughter. Encouraged DNR code status in the future with underlying age, frailty and poor outcomes of cpr/chance of meaningful survival. Hard Choices booklet given to daughter.  Palliative Care services outpatient were explained and offered.  [name] shares how strong-willed her mother is and that she will overcome this. [name] jokes that her mother has not required hospitalization until this year (besides child birth and hysterectomy). Therapeutic listening and emotional/spiritual support provided. PMT contact information given.    SUMMARY OF RECOMMENDATIONS    MOST form completed with patient and daughter. Patient's current wishes are for FULL code "try a time or two", full scope treatment, antibiotics if life can be prolonged, IVF for defined trial  period, and NO feeding tube. Copies made for chart and daughter.  Patient would not want prolonged, heroic measures if nearing EOL and poor chance of meaningful quality of life.   Daughter is in contact with an attorney to complete durable and healthcare POA paperwork for patient.   Continue current plan of care and medical management.   Patient motivated to work with therapy and willing to discharge to SNF for short-term rehab if necessary. She is hopeful to return home to her apartment following rehab.   Recommended outpatient palliative referral if daughter is interested in the future.   Code Status/Advance Care Planning  Full code  Symptom Management:   Per attending  Palliative Prophylaxis:   Bowel Regimen, Delirium Protocol, Frequent Pain Assessment, Oral Care and Turn Reposition  Additional Recommendations (Limitations, Scope, Preferences):  Full Scope Treatment  Psycho-social/Spiritual:   Desire for further Chaplaincy support:yes  Additional Recommendations: Caregiving  Support/Resources  Prognosis:   Unable to determine  Discharge Planning: To Be Determined      Primary Diagnoses: Present on Admission:  Bradycardia  Hypotension  A-fib (HCC)  Dementia (HCC)  Hyperkalemia  ARF (acute renal failure) (HCC)  Pleural effusion  Pancreatitis, acute   I have reviewed the medical record, interviewed the patient and family, and examined the patient. The following aspects are pertinent.  Past Medical History:  Diagnosis Date   Atrial fibrillation ([place])    Chronic back pain    Diarrhea    Dysphagia 05/03/2016   GERD (gastroesophageal reflux disease)    Social History   Socioeconomic History   Marital status: Widowed    Spouse name: Not on file   Number of children: Not on file   Years of education: Not on file   Highest education level: Not on file  Occupational History   Not on file  Social Needs   Financial resource strain:  Not hard at all   Food insecurity    Worry: Never true    Inability: Never true   Transportation needs    Medical: No    Non-medical: No  Tobacco Use   Smoking status: Never Smoker   Smokeless tobacco: Never Used  Substance and Sexual Activity   Alcohol use: No   Drug use: No   Sexual activity: Yes    Birth control/protection: Surgical  Lifestyle   Physical activity    Days per week: 5 days    Minutes per session: 30 min   Stress: Not at all  Relationships   Social connections    Talks on phone: More than three times a week    Gets together: More than three times a week    Attends religious service: More than 4 times per year    Active member of club or organization: Yes    Attends meetings of clubs or organizations: More than 4 times per year    Relationship status: Widowed  Other Topics Concern   Not on file  Social History Narrative   Pt lives in an apartment complex, reports that she still drives   Family History  Problem Relation Age of Onset   Hypertension Child    Scheduled Meds:  apixaban  2.5 mg Oral BID   Chlorhexidine Gluconate Cloth  6 each Topical Daily   diltiazem  360 mg Oral Daily   magnesium oxide  400 mg Oral TID   memantine  5 mg Oral BID   metoprolol tartrate  25 mg Oral BID   pantoprazole  40 mg Oral Daily   QUEtiapine  25 mg Oral QHS   sertraline  50 mg Oral q morning - 10a   sodium chloride flush  3 mL Intravenous Once   sodium chloride flush  3 mL Intravenous Q12H   Continuous Infusions: PRN Meds:.acetaminophen **OR** acetaminophen, diltiazem, diphenoxylate-atropine, fentaNYL (SUBLIMAZE) injection, guaiFENesin-dextromethorphan, ondansetron (ZOFRAN) IV, ondansetron, polyethylene glycol, polyvinyl alcohol Medications Prior to Admission:  Prior to Admission medications   Medication Sig Start Date End Date Taking? Authorizing Provider  acetaminophen (TYLENOL) 325 MG tablet Take 650 mg by mouth every 6 (six) hours as  needed. 12/05/18  Yes [provider]  alendronate (FOSAMAX) 70 MG tablet Take 1 tablet (70 mg total) by mouth once a week. Take with a full glass of water on an empty stomach. 12/23/18  Yes Gerlene Fee, NP  apixaban (ELIQUIS) 5 MG TABS tablet Take 1 tablet (5 mg total) by mouth 2 (two) times daily. 04/07/19  Yes [name], MD  Balsam [place]-Castor Oil [facility]) OINT Apply topically to sacrum and bilateral buttocks every shift and as needed for prevention 12/06/18  Yes [provider]  calcium-vitamin D (OS-CAL 500 + D) 500-200 MG-UNIT tablet Take 1 tablet by mouth 2 (two) times daily. 12/05/18  Yes Oretha Milch D, MD  diltiazem (CARDIZEM CD) 360 MG 24 hr capsule Take 1 capsule (360 mg total) by mouth daily. 04/07/19  Yes [name], MD  magnesium oxide (MAG-OX) 400 MG tablet Take 1 tablet (400 mg total) by mouth 3 (three) times daily. 12/23/18  Yes Gerlene Fee, NP  memantine (NAMENDA) 5 MG tablet Take 1 tablet (5 mg total) by mouth 2 (two) times daily. 12/23/18  Yes Gerlene Fee, NP  metoprolol tartrate (LOPRESSOR) 25 MG tablet Take 1 tablet (25 mg total) by mouth 2 (two) times daily. 04/08/19  Yes [name], MD  Multiple Vitamin (MULTIVITAMIN WITH MINERALS) TABS tablet Take 1 tablet by mouth daily. 12/05/18  Yes Desiree Hane, MD  NON FORMULARY Diet Type: NAS 12/05/18  Yes [provider]  omeprazole (PRILOSEC) 20 MG capsule Take 1 capsule (20 mg total) by mouth every morning. 12/05/18  Yes Desiree Hane, MD  Ostomy Supplies (SKIN PREP WIPES) MISC Apply to bilateral heels every shift for prevention 12/06/18  Yes [provider]  Polyethyl Glycol-Propyl Glycol (SYSTANE OP) Place 1 drop into both eyes daily as needed. Dry Eyes    Yes [provider]  QUEtiapine (SEROQUEL) 25 MG tablet Take 1 tablet (25 mg total) by mouth at bedtime. 12/23/18  Yes Gerlene Fee, NP  sertraline (ZOLOFT) 50 MG tablet Take 1 tablet (50 mg total) by  mouth every morning. 12/23/18  Yes Gerlene Fee, NP  diphenoxylate-atropine (LOMOTIL) 2.5-0.025 MG tablet Take 1 tablet by mouth 4 (four) times daily as needed for diarrhea or loose stools. Patient not taking: Reported on 02/28/2019 12/23/18   Gerlene Fee, NP  docusate sodium (COLACE) 100 MG  capsule Take 1 capsule (100 mg total) by mouth 2 (two) times daily. Patient not taking: Reported on 02/28/2019 12/05/18   Oretha Milch D, MD  feeding supplement, ENSURE ENLIVE, (ENSURE ENLIVE) LIQD Take 237 mLs by mouth 2 (two) times daily between meals. Patient not taking: Reported on 02/28/2019 12/05/18   Oretha Milch D, MD  polyethylene glycol (MIRALAX / GLYCOLAX) 17 g packet Take 17 g by mouth daily as needed for mild constipation. Patient not taking: Reported on 02/28/2019 12/05/18   Desiree Hane, MD   Allergies  Allergen Reactions   Penicillins Anaphylaxis   Emetrol Nausea Only    swelling   Feldene [Piroxicam] Nausea And Vomiting   Sulfa Antibiotics     swelling   Review of Systems  Constitutional: Positive for activity change and fatigue.  Gastrointestinal: Positive for abdominal pain and nausea.   Physical Exam Vitals signs and nursing note reviewed.  Constitutional:      General: She is awake.  HENT:     Head: Normocephalic and atraumatic.  Pulmonary:     Effort: No tachypnea, accessory muscle usage or respiratory distress.  Skin:    General: Skin is warm and dry.  Neurological:     Mental Status: She is alert and oriented to person, place, and time.  Psychiatric:        Mood and Affect: Mood normal.        Speech: Speech normal.        Behavior: Behavior normal.        Cognition and Memory: Cognition normal.     Vital Signs: BP (!) 117/57 (BP Location: Right Arm)    Pulse 64    Temp 98.3 F (36.8 C) (Oral)    Resp 18    Ht '5\' 4"'  (1.626 m)    Wt 76.9 kg    SpO2 97%    BMI 29.10 kg/m  Pain Scale: 0-10 POSS *See Group Information*: S-Acceptable,Sleep, easy to  arouse Pain Score: 0-No pain   SpO2: SpO2: 97 % O2 Device:SpO2: 97 % O2 Flow Rate: .O2 Flow Rate (L/min): 2 L/min  IO: Intake/output summary:   Intake/Output Summary (Last 24 hours) at 04/17/2019 1732 Last data filed at 04/17/2019 1351 Gross per 24 hour  Intake 480 ml  Output 100 ml  Net 380 ml    LBM: Last BM Date: 04/10/19 Baseline Weight: Weight: 62.1 kg Most recent weight: Weight: 76.9 kg     Palliative Assessment/Data: PPS 50%   Flowsheet Rows     Most Recent Value  Intake Tab  Referral Department  Hospitalist  Unit at Time of Referral  Med/Surg Unit  Palliative Care Primary Diagnosis  Other (Comment)  Date Notified  04/17/19  Palliative Care Type  New Palliative care  Reason for referral  Clarify Goals of Care  Date first seen by Palliative Care  04/17/19  # of days Palliative referral response time  0 Day(s)  Clinical Assessment  Palliative Performance Scale Score  50%  Psychosocial & Spiritual Assessment  Palliative Care Outcomes  Patient/Family meeting held?  Yes  Who was at the meeting?  patient and daughter  Palliative Care Outcomes  Clarified goals of care, Provided end of life care assistance, Provided advance care planning, Provided psychosocial or spiritual support, ACP counseling assistance      Time In: 6387 Time Out: 1500 Time Total: 78mn Greater than 50%  of this time was spent counseling and coordinating care related to the above assessment and plan.  Signed  by:  Ihor Dow, DNP, FNP-C Palliative Medicine Team  Phone: [phone] Fax: [phone]   Please contact Palliative Medicine Team phone at [phone] for questions and concerns.  For individual provider: See Shea Evans

## 2019-04-17 NOTE — Progress Notes (Signed)
Admit: 04/10/2019 LOS: 7  65F AKI likely ATN  Subjective:  . No interval events . Partially recorded UOP, has purewick; urine in container; she tells me has had at least 2 voids this morning . No UA or UPC resulted . Creatinine stable at 1.9, K5.1 . Weight is increased 14 kg . On room air, stable breathing, denies dyspnea  10/07 0701 - 10/08 0700 In: -  Out: 102 [Urine:101; Stool:1]  Filed Weights   04/14/19 0500 04/16/19 0500 04/17/19 0500  Weight: 71.2 kg 74.9 kg 76.9 kg    Scheduled Meds: . apixaban  2.5 mg Oral BID  . Chlorhexidine Gluconate Cloth  6 each Topical Daily  . diltiazem  360 mg Oral Daily  . magnesium oxide  400 mg Oral TID  . memantine  5 mg Oral BID  . metoprolol tartrate  25 mg Oral BID  . pantoprazole  40 mg Oral Daily  . QUEtiapine  25 mg Oral QHS  . sertraline  50 mg Oral q morning - 10a  . sodium chloride flush  3 mL Intravenous Once  . sodium chloride flush  3 mL Intravenous Q12H   Continuous Infusions: PRN Meds:.acetaminophen **OR** acetaminophen, diltiazem, diphenoxylate-atropine, fentaNYL (SUBLIMAZE) injection, guaiFENesin-dextromethorphan, ondansetron (ZOFRAN) IV, ondansetron, polyethylene glycol, polyvinyl alcohol  Current Labs: reviewed    Physical Exam:  Blood pressure 116/61, pulse 67, temperature 97.6 F (36.4 C), resp. rate 16, height 5\' 4"  (1.626 m), weight 76.9 kg, SpO2 94 %. GEN: Elderly, awake, alert, conversant ENT: NCAT EYES: EOMI CV: Slow, irregular, no murmur or rub PULM: Diminished in the bases, no crackles ABD: Soft, nontender, bowel sounds present SKIN: No rashes or lesions EXT: No lower extremity edema   A 1. AKI, baseline nl GFR; suspect ATN after initial hypovolemic insult 2. Hyperkalemia, resolved 3. AFib with SVR< Cardiology / Primary managing. Stable 4. N/V, CT A/P suggested pancreatitis/duodenitis; lipase WNL, per primary 5. DCHF, stabl ecurrently  P . Continue supportive measures, does not need IV  fluids . Try to obtain UA and urine protein creatinine ratio . Daily weights, Daily Renal Panel, Strict I/Os, Avoid nephrotoxins (NSAIDs, judicious IV Contrast) . Will cont to follow . Medication Issues; o Preferred narcotic agents for pain control are hydromorphone, fentanyl, and methadone. Morphine should not be used.  o Baclofen should be avoided o Avoid oral sodium phosphate and magnesium citrate based laxatives / bowel preps    Pearson Grippe MD 04/17/2019, 9:36 AM  Recent Labs  Lab 04/15/19 0435 04/16/19 0456 04/17/19 0752  NA 131* 131* 131*  K 5.4* 5.0 5.1  CL 100 99 99  CO2 24 24 24   GLUCOSE 120* 115* 117*  BUN 42* 45* 48*  CREATININE 1.89* 2.02* 1.87*  CALCIUM 8.7* 8.8* 9.1  PHOS 3.6 4.0 4.2   Recent Labs  Lab 04/14/19 0441 04/16/19 0456 04/17/19 0752  WBC 8.4 10.9* 11.1*  HGB 10.6* 11.2* 11.1*  HCT 36.5 38.0 37.4  MCV 89.2 88.4 86.8  PLT 287 425* 423*

## 2019-04-17 NOTE — NC FL2 (Signed)
[place] LEVEL OF CARE SCREENING TOOL     IDENTIFICATION  Patient Name: Kristy Mckinney Birthdate: [birth year] Sex: female Admission Date (Current Location): 04/10/2019  [facility] and [place] Number:  [employer] and Address:  [place] [place] [address] [place]      Provider Number: [id number]  Attending Physician Name and Address:  Roxan Hockey, MD  Relative Name and Phone Number:       Current Level of Care: Hospital Recommended Level of Care: [place] Prior Approval Number:    Date Approved/Denied:   PASRR Number: [id number]  Discharge Plan: SNF    Current Diagnoses: Patient Active Problem List   Diagnosis Date Noted  . Pleural effusion 04/13/2019  . Pancreatitis, acute 04/13/2019  . Acute respiratory failure with hypoxia ([place]) 04/13/2019  . Bradycardia 04/10/2019  . Hypotension 04/10/2019  . Hyperkalemia 04/10/2019  . ARF (acute renal failure) ([place]) 04/10/2019  . Atrial fibrillation ([place]) 04/06/2019  . A-fib ([place]) 04/04/2019  . Dementia ([place]) 12/12/2018  . GERD without esophagitis 12/06/2018  . Post-menopausal osteoporosis 12/06/2018  . Acute delirium 12/06/2018  . Hypomagnesemia 12/06/2018  . Acute blood loss anemia 12/06/2018  . Closed fracture of right femur, unspecified fracture morphology, initial encounter ([place]) 11/28/2018  . Acute diastolic CHF (congestive heart failure) ([place]) 11/28/2018  . Fracture, intertrochanteric, right femur ([place]) 11/28/2018  . Atrial fibrillation with rapid ventricular response ([place]) 11/25/2018  . Atrial fibrillation with RVR ([place]) 11/24/2018  . Chronic back pain 11/24/2018  . Anxiety and depression 11/24/2018  . Chronic constipation 05/03/2016  . Dysphagia 05/03/2016  . Cholecystitis with cholelithiasis 01/29/2014    Orientation RESPIRATION BLADDER Height & Weight     Self, Time, Situation, Place  Normal Continent(at times incontinent) Weight: 76.9  kg Height:  5\' 4"  (162.6 cm)  BEHAVIORAL SYMPTOMS/MOOD NEUROLOGICAL BOWEL NUTRITION STATUS  (none) (none) Continent(at times incontinent) Diet  AMBULATORY STATUS COMMUNICATION OF NEEDS Skin   Extensive Assist Verbally Normal                       Personal Care Assistance Level of Assistance  Bathing, Feeding, Dressing Bathing Assistance: Maximum assistance Feeding assistance: Independent Dressing Assistance: Limited assistance     Functional Limitations Info  Sight, Hearing, Speech Sight Info: Adequate Hearing Info: Adequate Speech Info: Adequate    SPECIAL CARE FACTORS FREQUENCY  PT (By licensed PT)     PT Frequency: 5X/W              Contractures Contractures Info: Not present    Additional Factors Info  Code Status, Allergies Code Status Info: full Allergies Info: Penicillins, Emetrol, Feldene, Sulfa antibiotics           Current Medications (04/17/2019):  This is the current hospital active medication list Current Facility-Administered Medications  Medication Dose Route Frequency Provider Last Rate Last Dose  . acetaminophen (TYLENOL) tablet 650 mg  650 mg Oral Q6H PRN Jani Gravel, MD   650 mg at 04/14/19 2342   Or  . acetaminophen (TYLENOL) suppository 650 mg  650 mg Rectal Q6H PRN Jani Gravel, MD      . apixaban Arne [place]) tablet 2.5 mg  2.5 mg Oral BID Kathie Dike, MD   2.5 mg at 04/17/19 0848  . Chlorhexidine Gluconate Cloth 2 % PADS 6 each  6 each Topical Daily [name], MD   6 each at 04/17/19 0848  . diltiazem (CARDIZEM CD) 24 hr capsule  360 mg  360 mg Oral Daily Kathie Dike, MD   360 mg at 04/17/19 0848  . diltiazem (CARDIZEM) injection 5 mg  5 mg Intravenous Q6H PRN Omar Person, NP      . diphenoxylate-atropine (LOMOTIL) 2.5-0.025 MG per tablet 1 tablet  1 tablet Oral QID PRN Jani Gravel, MD   1 tablet at 04/13/19 1453  . fentaNYL (SUBLIMAZE) injection 25 mcg  25 mcg Intravenous Q2H PRN Roxan Hockey, MD   25 mcg at 04/16/19  2314  . guaiFENesin-dextromethorphan (ROBITUSSIN DM) 100-10 MG/5ML syrup 5 mL  5 mL Oral Q4H PRN [name], MD   5 mL at 04/14/19 2106  . magnesium oxide (MAG-OX) tablet 400 mg  400 mg Oral TID Jani Gravel, MD   400 mg at 04/17/19 0847  . memantine (NAMENDA) tablet 5 mg  5 mg Oral BID Jani Gravel, MD   5 mg at 04/17/19 0847  . metoprolol tartrate (LOPRESSOR) tablet 25 mg  25 mg Oral BID Kathie Dike, MD   25 mg at 04/17/19 0847  . ondansetron (ZOFRAN) injection 4 mg  4 mg Intravenous Q6H PRN Jani Gravel, MD   4 mg at 04/17/19 0610  . ondansetron (ZOFRAN) tablet 4 mg  4 mg Oral Q8H PRN Kathie Dike, MD   4 mg at 04/12/19 1558  . pantoprazole (PROTONIX) EC tablet 40 mg  40 mg Oral Daily Jani Gravel, MD   40 mg at 04/17/19 0847  . polyethylene glycol (MIRALAX / GLYCOLAX) packet 17 g  17 g Oral Daily PRN Jani Gravel, MD      . polyvinyl alcohol (LIQUIFILM TEARS) 1.4 % ophthalmic solution 1 drop  1 drop Both Eyes Daily PRN Jani Gravel, MD      . QUEtiapine (SEROQUEL) tablet 25 mg  25 mg Oral Loma Sousa, MD   25 mg at 04/16/19 2053  . sertraline (ZOLOFT) tablet 50 mg  50 mg Oral q morning - 10a Jani Gravel, MD   50 mg at 04/17/19 0846  . sodium chloride flush (NS) 0.9 % injection 3 mL  3 mL Intravenous Once Rolland Porter, MD      . sodium chloride flush (NS) 0.9 % injection 3 mL  3 mL Intravenous Q12H Kathie Dike, MD   3 mL at 04/17/19 0849     Discharge Medications: Please see discharge summary for a list of discharge medications.  Relevant Imaging Results:  Relevant Lab Results:   Additional Information [id number]  [place], [place]

## 2019-04-17 NOTE — Progress Notes (Signed)
Physical Therapy Treatment Patient Details Name: Kristy Mckinney MRN: [id number] DOB: September 24, 1931 Today's Date: 04/17/2019    History of Present Illness Kristy Mckinney  is a 85 y.o. female, w persistent atrial fibrillation , mitral stenosis , CHF (diastolic), and chronic back pain, w recent admission where cardizem was increased from 240 to 360mg  po qday, and  Started on metoprolol 25mg  po bid, apparently presents with c/o n/v starting yesterday.    PT Comments    Patient demonstrates increased generalized strength for sitting up at bedside, transfers and ambulation into hallway.  Patient demonstrates slightly labored cadence without loss of balance, but slows down once fatigued becoming more unsteady, but without loss of balance.  Patient tolerated sitting up in chair after therapy with call light in reach.  Patient will benefit from continued physical therapy in hospital and recommended venue below to increase strength, balance, endurance for safe ADLs and gait.    Follow Up Recommendations  SNF;Supervision for mobility/OOB;Supervision/Assistance - 24 hour     Equipment Recommendations  None recommended by PT    Recommendations for Other Services       Precautions / Restrictions Precautions Precautions: Fall Restrictions Weight Bearing Restrictions: No    Mobility  Bed Mobility Overal bed mobility: Needs Assistance Bed Mobility: Supine to Sit     Supine to sit: Supervision     General bed mobility comments: demonstrates improvement for sitting up at bedside, required assistance to move covers  Transfers Overall transfer level: Needs assistance Equipment used: Rolling walker (2 wheeled);None Transfers: Sit to/from [employer] to Stand: Min guard Stand pivot transfers: Min guard       General transfer comment: slightly labored unsteady movement, has to lean on nearby objects for support when not using RW  Ambulation/Gait Ambulation/Gait  assistance: Min guard;Min assist Gait Distance (Feet): 40 Feet Assistive device: Rolling walker (2 wheeled) Gait Pattern/deviations: Decreased step length - right;Decreased step length - left;Decreased stride length Gait velocity: decreased   General Gait Details: increased distance/endurance for ambulation with slightly unsteady cadence and decreased velocity once fatigued resulting in smaller steps/stride length   Stairs             Wheelchair Mobility    Modified Rankin (Stroke Patients Only)       Balance Overall balance assessment: Needs assistance Sitting-balance support: Feet supported Sitting balance-Leahy Scale: Fair Sitting balance - Comments: fair/good seated at EOB   Standing balance support: During functional activity;No upper extremity supported Standing balance-Leahy Scale: Poor Standing balance comment: fair/poor without AD, fair/good using RW                            Cognition Arousal/Alertness: Awake/alert Behavior During Therapy: WFL for tasks assessed/performed Overall Cognitive Status: Within Functional Limits for tasks assessed                                        Exercises General Exercises - Lower Extremity Long Arc Quad: Seated;AROM;Strengthening;Both;10 reps Hip Flexion/Marching: Seated;AROM;Strengthening;Both;10 reps Toe Raises: Seated;AROM;Strengthening;Both;10 reps Heel Raises: Seated;AROM;Strengthening;Both;10 reps    General Comments        Pertinent Vitals/Pain Pain Assessment: No/denies pain    Home Living                      Prior Function  PT Goals (current goals can now be found in the care plan section) Acute Rehab PT Goals Patient Stated Goal: return home PT Goal Formulation: With patient/family Time For Goal Achievement: 04/30/19 Potential to Achieve Goals: Good Progress towards PT goals: Progressing toward goals    Frequency    Min 3X/week      PT  Plan Current plan remains appropriate    Co-evaluation              AM-PAC PT "6 Clicks" Mobility   Outcome Measure  Help needed turning from your back to your side while in a flat bed without using bedrails?: None Help needed moving from lying on your back to sitting on the side of a flat bed without using bedrails?: None Help needed moving to and from a bed to a chair (including a wheelchair)?: A Little Help needed standing up from a chair using your arms (e.g., wheelchair or bedside chair)?: A Little Help needed to walk in hospital room?: A Little Help needed climbing 3-5 steps with a railing? : A Lot 6 Click Score: 19    End of Session   Activity Tolerance: Patient tolerated treatment well;Patient limited by fatigue Patient left: in chair;with call bell/phone within reach Nurse Communication: Mobility status PT Visit Diagnosis: Unsteadiness on feet (R26.81);Other abnormalities of gait and mobility (R26.89);Muscle weakness (generalized) (M62.81)     Time: [id number] PT Time Calculation (min) (ACUTE ONLY): 22 min  Charges:  $Therapeutic Activity: 8-22 mins                     12:34 PM, 04/17/19 Lonell Grandchild, MPT Physical Therapist with [facility] [phone] office [phone] mobile phone

## 2019-04-17 NOTE — Progress Notes (Addendum)
PROGRESS NOTE    Kristy Mckinney  [id number] DOB: [birth year] DOA: 04/10/2019 PCP: [name], MD    Brief Narrative:  85 year old female who was admitted to the hospital with probable viral gastroenteritis and severe dehydration with hyperkalemia and acute renal failure.  She was also bradycardic which was felt to be related to the hyperkalemia as well as her rate control medications for atrial fibrillation.  She was hydrated with IV fluids with improvement of renal function.  Rate control medication was initially held, but then resumed as heart rate improved.  Unfortunately, her GI symptoms persisted.  CT scan performed shows possible pancreatitis.  She is also developed shortness of breath and imaging indicates bilateral pleural effusions.  Further work-up in process.   Assessment & Plan:   Principal Problem:   Bradycardia Active Problems:   Dementia (HCC)   A-fib (HCC)   Hypotension   Hyperkalemia   ARF (acute renal failure) (HCC)   Pleural effusion   Pancreatitis, acute   Acute respiratory failure with hypoxia (HCC)   Palliative care by specialist   Goals of care, counseling/discussion   Generalized weakness   1)Acute renal failure.  Related to volume depletion in the setting of GI losses.  Patient has been on IV fluids and overall improvement of renal function.  Renal ultrasound and CT abdomen and pelvis without obstructive uropathy fluids discontinued on 10/3, continue down to 1.9 from  2.02 (from 1.0 on 04/12/2019) , baseline creatinine 0.8, creatinine on admission 2.0 -Last known EF over 70%   Pt has evidence of pleural effusions,  -No further IV fluids - encourage p.o. fluids. -Nephrology consult appreciated Urine output improving   2)Hyperkalemia--.  Potassium was 6.7 on admission related to renal failure.  Was likely contributing to bradycardia as well too.  Potassium is back down to 5.1 after another dose of Lokelma  3)Permanent atrial fibrillation ---  patient had bradycardia initially on admission in the setting of severe hyperkalemia.  Cardizem and metoprolol held on admission.  Overall hyperkalemia had also improved.  Cardizem was restarted and she is back on her home dose of 360 mg daily.  She was also restarted on her home dose of metoprolol   Continue anticoagulation for atrial fibrillation--cardiology input appreciated  4)Nausea and vomiting/abdominal pain/pancreatitis/duodenitis--CT abdomen and pelvis findings noted  --Tolerating diet better,,   She is already status post cholecystectomy.  Advance diet as tolerated  -Encourage soft diet  5)acute hypoxic respiratory failure secondary to bilateral pleural effusions.  Likely precipitated by IV fluid hydration for initial acute renal failure.  -Hypoxia has mostly resolved  6)HFpEF--  Patient has evidence of pleural effusions.  Check BNP.  Most recent echocardiogram from 03/2019 shows ejection fraction of > 70%.  Will avoid Lasix at this time in the setting of renal failure/pancreatitis.  Bilateral pleural effusions.  Likely related to IV hydration.  Echocardiogram shows preserved ejection fraction.  Can consider thoracentesis if symptoms progress. -IV fluids has been discontinued  7) generalized weakness and debility--physical therapy evaluation recommends SNF rehab  8)Social/Ethics--palliative care consult for goals of care appreciated, patient is a full code  DVT prophylaxis: Apixaban Code Status: Full code Family Communication:    Discussed with daughter Ms Leda Min   Disposition Plan: Awaiting bed availability and insurance approval for transfer to SNF rehab  Consultants:   Cardiology/nephrology  Procedures:  Echo:  1. Left ventricular ejection fraction, by visual estimation, is 70 to 75%. The left ventricle has hyperdynamic function. Decreased left ventricular size.  There is moderately increased left ventricular hypertrophy.  2. Left ventricular diastolic Doppler parameters  are indeterminate pattern of LV diastolic filling.  3. Right ventricular volume and pressure overload.  4. Global right ventricle has normal systolic function.The right ventricular size is moderately enlarged. Mildly increased right ventricular wall thickness.  5. Left atrial size was severely dilated.  6. Right atrial size was normal.  7. Presence of pericardial fat pad.  8. Moderate aortic valve annular calcification.  9. Moderate to severe mitral annular calcification. 10. Severe calcification of the mitral valve leaflet(s). Valve appears stenotic, not completely evaluated. Suggest follow-up images with VTI measurements or planimetry if possible. 11. The mitral valve is degenerative. Mild mitral valve regurgitation. 12. The tricuspid valve is grossly normal. Tricuspid valve regurgitation is mild. 13. Aortic valve area, by VTI measures 1.83 cm. 14. Aortic valve mean gradient measures 8.8 mmHg. 15. The aortic valve is tricuspid Aortic valve regurgitation was not visualized by color flow Doppler. Mild aortic valve stenosis. 16. Moderately elevated pulmonary artery systolic pressure. 17. The tricuspid regurgitant velocity is 3.67 m/s, and with an assumed right atrial pressure of 8 mmHg, the estimated right ventricular systolic pressure is moderately elevated at 61.9 mmHg. 18. The inferior vena cava is normal in size with <50% respiratory variability, suggesting right atrial pressure of 8 mmHg.  19. The pulmonic valve was grossly normal. Pulmonic valve regurgitation is trivial by color flow Doppler.  Antimicrobials:      Subjective: - Voiding better, nausea but no emesis, oral intake is fair  Objective: Vitals:   04/16/19 2101 04/17/19 0500 04/17/19 0509 04/17/19 1314  BP: 131/63  116/61 (!) 117/57  Pulse: 61  67 64  Resp: 17  16 18   Temp: 97.6 F (36.4 C)  97.6 F (36.4 C) 98.3 F (36.8 C)  TempSrc: Oral   Oral  SpO2: 97%  94% 97%  Weight:  76.9 kg    Height:         Intake/Output Summary (Last 24 hours) at 04/17/2019 1737 Last data filed at 04/17/2019 1351 Gross per 24 hour  Intake 480 ml  Output 100 ml  Net 380 ml   Filed Weights   04/14/19 0500 04/16/19 0500 04/17/19 0500  Weight: 71.2 kg 74.9 kg 76.9 kg    Examination:  General exam: Alert, awake, oriented x 3 Respiratory system: Diminished breath sounds at bases. Respiratory effort normal. Cardiovascular system: Irregularly irregular,. No murmurs, rubs, gallops. Gastrointestinal system: Abdomen is ND, soft and tender in periumbilical area.Normal bowel sounds heard. Central nervous system: Generalized weakness, no new focal deficits alert and oriented.   Extremities: No C/C/E, +pedal pulses Skin: No rashes, lesions or ulcers Psychiatry: Judgement and insight appear normal. Mood & affect appropriate.   Data Reviewed:  CBC: Recent Labs  Lab 04/11/19 0356 04/12/19 0452 04/14/19 0441 04/16/19 0456 04/17/19 0752  WBC 7.8 7.0 8.4 10.9* 11.1*  HGB 10.2* 10.0* 10.6* 11.2* 11.1*  HCT 34.9* 35.0* 36.5 38.0 37.4  MCV 88.8 90.0 89.2 88.4 86.8  PLT 209 203 287 425* [id number]*   Basic Metabolic Panel: Recent Labs  Lab 04/12/19 0452 04/13/19 0428 04/14/19 0441 04/15/19 0435 04/16/19 0456 04/17/19 0752  NA 137 137 134*  134* 131* 131* 131*  K 4.2 5.3* 5.0  5.1 5.4* 5.0 5.1  CL 103 104 100  100 100 99 99  CO2 26 24 23  25 24 24 24   GLUCOSE 104* 116* 101*  101* 120* 115* 117*  BUN 30* 34* 42*  43* 42* 45* 48*  CREATININE 1.03* 1.54* 1.90*  1.91* 1.89* 2.02* 1.87*  CALCIUM 8.7* 8.9 9.0  9.0 8.7* 8.8* 9.1  MG 2.1  --   --   --   --   --   PHOS  --   --  3.4 3.6 4.0 4.2   GFR: Estimated Creatinine Clearance: 22.5 mL/min (A) (by C-G formula based on SCr of 1.87 mg/dL (H)). Liver Function Tests: Recent Labs  Lab 04/11/19 0356 04/14/19 0441 04/15/19 0435 04/16/19 0456 04/17/19 0752  AST 84* 52*  --   --   --   ALT 85* 69*  --   --   --   ALKPHOS 104 94  --   --   --   BILITOT  0.6 1.1  --   --   --   PROT 5.9* 6.0*  --   --   --   ALBUMIN 3.3* 3.3*  3.3* 3.3* 3.3* 3.4*   Recent Labs  Lab 04/13/19 1834 04/14/19 0441  LIPASE 16 16   No results for input(s): AMMONIA in the last 168 hours. Coagulation Profile: No results for input(s): INR, PROTIME in the last 168 hours. Cardiac Enzymes: No results for input(s): CKTOTAL, CKMB, CKMBINDEX, TROPONINI in the last 168 hours. BNP (last 3 results) No results for input(s): PROBNP in the last 8760 hours. HbA1C: No results for input(s): HGBA1C in the last 72 hours. CBG: Recent Labs  Lab 04/16/19 1715  GLUCAP 115*   Lipid Profile: No results for input(s): CHOL, HDL, LDLCALC, TRIG, CHOLHDL, LDLDIRECT in the last 72 hours. Thyroid Function Tests: No results for input(s): TSH, T4TOTAL, FREET4, T3FREE, THYROIDAB in the last 72 hours. Anemia Panel: No results for input(s): VITAMINB12, FOLATE, FERRITIN, TIBC, IRON, RETICCTPCT in the last 72 hours. Sepsis Labs: No results for input(s): PROCALCITON, LATICACIDVEN in the last 168 hours.  Recent Results (from the past 240 hour(s))  SARS CORONAVIRUS 2 (TAT 6-24 HRS) Nasopharyngeal Nasopharyngeal Swab     Status: None   Collection Time: 04/10/19  3:31 AM   Specimen: Nasopharyngeal Swab  Result Value Ref Range Status   SARS Coronavirus 2 NEGATIVE NEGATIVE Final    Comment: (NOTE) SARS-CoV-2 target nucleic acids are NOT DETECTED. The SARS-CoV-2 RNA is generally detectable in upper and lower respiratory specimens during the acute phase of infection. Negative results do not preclude SARS-CoV-2 infection, do not rule out co-infections with other pathogens, and should not be used as the sole basis for treatment or other patient management decisions. Negative results must be combined with clinical observations, patient history, and epidemiological information. The expected result is Negative. Fact Sheet for Patients: SugarRoll.be Fact Sheet  for Healthcare Providers: https://www.woods-mathews.com/ This test is not yet approved or cleared by the [place] FDA and  has been authorized for detection and/or diagnosis of SARS-CoV-2 by FDA under an Emergency Use Authorization (EUA). This EUA will remain  in effect (meaning this test can be used) for the duration of the COVID-19 declaration under Section 56 4(b)(1) of the Act, 21 U.S.C. section 360bbb-3(b)(1), unless the authorization is terminated or revoked sooner. Performed at [facility], [place] [address] [place], [place] [address]   MRSA PCR Screening     Status: None   Collection Time: 04/10/19 12:00 PM   Specimen: Nasopharyngeal  Result Value Ref Range Status   MRSA by PCR NEGATIVE NEGATIVE Final    Comment:        The GeneXpert MRSA Assay (FDA approved for  NASAL specimens only), is one component of a comprehensive MRSA colonization surveillance program. It is not intended to diagnose MRSA infection nor to guide or monitor treatment for MRSA infections. Performed at [facility], [address], [place], [place] [address]      Radiology Studies: No results found.  Scheduled Meds: . apixaban  2.5 mg Oral BID  . Chlorhexidine Gluconate Cloth  6 each Topical Daily  . diltiazem  360 mg Oral Daily  . magnesium oxide  400 mg Oral TID  . memantine  5 mg Oral BID  . metoprolol tartrate  25 mg Oral BID  . pantoprazole  40 mg Oral Daily  . QUEtiapine  25 mg Oral QHS  . sertraline  50 mg Oral q morning - 10a  . sodium chloride flush  3 mL Intravenous Once  . sodium chloride flush  3 mL Intravenous Q12H   Continuous Infusions:    LOS: 7 days    Roxan Hockey, MD [facility]   If 7PM-7AM, please contact night-coverage [email or web address]  04/17/2019, 5:37 PM

## 2019-04-18 LAB — URINALYSIS, ROUTINE W REFLEX MICROSCOPIC
Bilirubin Urine: NEGATIVE
Glucose, UA: NEGATIVE mg/dL
Ketones, ur: NEGATIVE mg/dL
Nitrite: NEGATIVE
Protein, ur: NEGATIVE mg/dL
Specific Gravity, Urine: 1.009 (ref 1.005–1.030)
pH: 6 (ref 5.0–8.0)

## 2019-04-18 LAB — RENAL FUNCTION PANEL
Albumin: 3.6 g/dL (ref 3.5–5.0)
Anion gap: 8 (ref 5–15)
BUN: 44 mg/dL — ABNORMAL HIGH (ref 8–23)
CO2: 25 mmol/L (ref 22–32)
Calcium: 9.5 mg/dL (ref 8.9–10.3)
Chloride: 102 mmol/L (ref 98–111)
Creatinine, Ser: 1.42 mg/dL — ABNORMAL HIGH (ref 0.44–1.00)
GFR calc Af Amer: 39 mL/min — ABNORMAL LOW (ref >60)
GFR calc non Af Amer: 34 mL/min — ABNORMAL LOW (ref >60)
Glucose, Bld: 122 mg/dL — ABNORMAL HIGH (ref 70–99)
Phosphorus: 4 mg/dL (ref 2.5–4.6)
Potassium: 5.2 mmol/L — ABNORMAL HIGH (ref 3.5–5.1)
Sodium: 135 mmol/L (ref 135–145)

## 2019-04-18 LAB — PROTEIN / CREATININE RATIO, URINE
Creatinine, Urine: 62.33 mg/dL
Protein Creatinine Ratio: 0.29 mg/mg{Cre} — ABNORMAL HIGH (ref 0.00–0.15)
Total Protein, Urine: 18 mg/dL

## 2019-04-18 MED ORDER — CEPHALEXIN 500 MG PO CAPS: 500.0000 mg | ORAL_CAPSULE | Freq: Three times a day (TID) | ORAL | Status: DC

## 2019-04-18 MED ORDER — FLUCONAZOLE 100 MG PO TABS
200.0000 mg | ORAL_TABLET | Freq: Once | ORAL | Status: AC
  Administered 2019-04-18: 200 mg via ORAL
  Filled 2019-04-18: qty 2

## 2019-04-18 MED ORDER — ENSURE ENLIVE PO LIQD
237.0000 mL | Freq: Two times a day (BID) | ORAL | Status: DC
  Administered 2019-04-18 – 2019-04-19 (×2): 237 mL via ORAL

## 2019-04-18 MED ORDER — SODIUM ZIRCONIUM CYCLOSILICATE 10 G PO PACK
10.0000 g | PACK | Freq: Once | ORAL | Status: AC
  Administered 2019-04-18: 16:00:00 10 g via ORAL
  Filled 2019-04-18: qty 1

## 2019-04-18 MED ORDER — CEPHALEXIN 500 MG PO CAPS
500.0000 mg | ORAL_CAPSULE | Freq: Two times a day (BID) | ORAL | Status: DC
  Administered 2019-04-18: 500 mg via ORAL
  Filled 2019-04-18 (×3): qty 1

## 2019-04-18 NOTE — Progress Notes (Signed)
Walked in hallway at slow steady pace with walker about 100 feet.  Had to stop several times due to being tired, but not winded.

## 2019-04-18 NOTE — Progress Notes (Signed)
Admit: 04/10/2019 LOS: 8  43F AKI likely ATN  Subjective:  . No interval events . A.m. labs pending . 0.5 L urine output, at least yesterday . Denies dyspnea . Eating about half of her meal . No UA or UPC resulted  10/08 0701 - 10/09 0700 In: 720 [P.O.:720] Out: 550 [Urine:550]  Filed Weights   04/16/19 0500 04/17/19 0500 04/18/19 0417  Weight: 74.9 kg 76.9 kg 76.5 kg    Scheduled Meds: . apixaban  2.5 mg Oral BID  . Chlorhexidine Gluconate Cloth  6 each Topical Daily  . diltiazem  360 mg Oral Daily  . magnesium oxide  400 mg Oral TID  . memantine  5 mg Oral BID  . metoprolol tartrate  25 mg Oral BID  . pantoprazole  40 mg Oral Daily  . QUEtiapine  25 mg Oral QHS  . sertraline  50 mg Oral q morning - 10a  . sodium chloride flush  3 mL Intravenous Once  . sodium chloride flush  3 mL Intravenous Q12H   Continuous Infusions: PRN Meds:.acetaminophen **OR** acetaminophen, diltiazem, diphenoxylate-atropine, fentaNYL (SUBLIMAZE) injection, guaiFENesin-dextromethorphan, ondansetron (ZOFRAN) IV, ondansetron, polyethylene glycol, polyvinyl alcohol  Current Labs: reviewed    Physical Exam:  Blood pressure 116/63, pulse (!) 54, temperature (!) 97.5 F (36.4 C), temperature source Oral, resp. rate 18, height 5\' 4"  (1.626 m), weight 76.5 kg, SpO2 94 %. GEN: Elderly, awake, alert, conversant, somewhat confused ENT: NCAT EYES: EOMI CV: Slow, irregular, no murmur or rub PULM: Diminished in the bases, no crackles ABD: Soft, nontender, bowel sounds present SKIN: No rashes or lesions EXT: No lower extremity edema   A 1. AKI, baseline nl GFR; suspect ATN after initial hypovolemic insult 2. Hyperkalemia, resolved 3. AFib with SVR< Cardiology / Primary managing. Stable 4. N/V, CT A/P suggested pancreatitis/duodenitis; lipase WNL, per primary 5. DCHF, stable currently  P . Continue supportive measures, does not need IV fluids . Try to obtain UA and urine protein creatinine  ratio . Daily weights, Daily Renal Panel, Strict I/Os, Avoid nephrotoxins (NSAIDs, judicious IV Contrast) . Will cont to follow . Medication Issues; o Preferred narcotic agents for pain control are hydromorphone, fentanyl, and methadone. Morphine should not be used.  o Baclofen should be avoided o Avoid oral sodium phosphate and magnesium citrate based laxatives / bowel preps    Pearson Grippe MD 04/18/2019, 9:36 AM  Recent Labs  Lab 04/15/19 0435 04/16/19 0456 04/17/19 0752  NA 131* 131* 131*  K 5.4* 5.0 5.1  CL 100 99 99  CO2 24 24 24   GLUCOSE 120* 115* 117*  BUN 42* 45* 48*  CREATININE 1.89* 2.02* 1.87*  CALCIUM 8.7* 8.8* 9.1  PHOS 3.6 4.0 4.2   Recent Labs  Lab 04/14/19 0441 04/16/19 0456 04/17/19 0752  WBC 8.4 10.9* 11.1*  HGB 10.6* 11.2* 11.1*  HCT 36.5 38.0 37.4  MCV 89.2 88.4 86.8  PLT 287 425* 423*

## 2019-04-18 NOTE — Progress Notes (Signed)
PROGRESS NOTE    Kristy Mckinney  [id number] DOB: 03/31/1932 DOA: 04/10/2019 PCP: [name], MD    Brief Narrative:  85 year old female who was admitted to the hospital with probable viral gastroenteritis and severe dehydration with hyperkalemia and acute renal failure.  She was also bradycardic which was felt to be related to the hyperkalemia as well as her rate control medications for atrial fibrillation.  She was hydrated with IV fluids with improvement of renal function.  Rate control medication was initially held, but then resumed as heart rate improved.  Unfortunately, her GI symptoms persisted.  CT scan performed shows possible pancreatitis.  She is also developed shortness of breath and imaging indicates bilateral pleural effusions.  Further work-up in process.   Assessment & Plan:   Principal Problem:   Bradycardia Active Problems:   Dementia (HCC)   A-fib (HCC)   Hypotension   Hyperkalemia   ARF (acute renal failure) (HCC)   Pleural effusion   Pancreatitis, acute   Acute respiratory failure with hypoxia (HCC)   Palliative care by specialist   Goals of care, counseling/discussion   Generalized weakness   1)Acute renal failure--  Related to volume depletion in the setting of GI losses.  Patient has been on IV fluids and overall improvement of renal function.  Renal ultrasound and CT abdomen and pelvis without obstructive uropathy fluids discontinued-Last known EF over 70%   Pt has evidence of pleural effusions,  -No further IV fluids - encourage p.o. fluids. -Nephrology consult appreciated Urine output improving -Creatinine continues to trend down, creatinine was 1.0 on 04/12/2019, creatinine on admission was 2.0, baseline creatinine 0.8   2)Hyperkalemia--.  Potassium was 6.7 on admission related to renal failure.  Was likely contributing to bradycardia as well too.  Potassium is 5.2  , will give another dose of low, this will be a third dose of her, this  admission   3)Permanent Atrial fibrillation --- patient had bradycardia initially on admission in the setting of severe hyperkalemia.  Cardizem and metoprolol held on admission.  Overall hyperkalemia had also improved.  Cardizem was restarted and she is back on her home dose of 360 mg daily.  She was also restarted on her home dose of metoprolol   Continue anticoagulation for atrial fibrillation--cardiology input appreciated  4)Nausea and vomiting/abdominal pain/pancreatitis/duodenitis--CT abdomen and pelvis findings noted  --Tolerating diet better,,   She is already status post cholecystectomy.  Advance diet as tolerated  -Encourage soft diet  5)acute hypoxic respiratory failure secondary to bilateral pleural effusions.  Likely precipitated by IV fluid hydration for initial acute renal failure.  -Hypoxia has mostly resolved  6)HFpEF--  Patient has evidence of pleural effusions.  Check BNP.  Most recent echocardiogram from 03/2019 shows ejection fraction of > 70%.  Will avoid Lasix at this time in the setting of renal failure/pancreatitis.  Bilateral pleural effusions.  Likely related to IV hydration.  Echocardiogram shows preserved ejection fraction.  Can consider thoracentesis if symptoms progress. -IV fluids has been discontinued  7) generalized weakness and debility--physical therapy evaluation recommends SNF rehab  8)Social/Ethics--palliative care consult for goals of care appreciated, patient is a full code  DVT prophylaxis: Apixaban Code Status: Full code Family Communication:    Discussed with daughter Ms Leda Min today Disposition Plan:  -[place] initially declined SNF placement, Peer to Peer review appears successful--  -Potassium trending up, give Lokelma if potassium normalizes discharge to SNF on 04/19/2019  Consultants:   Cardiology/nephrology  Procedures:  Echo:  1. Left ventricular ejection fraction, by visual estimation, is 70 to 75%. The left ventricle  has hyperdynamic function. Decreased left ventricular size. There is moderately increased left ventricular hypertrophy.  2. Left ventricular diastolic Doppler parameters are indeterminate pattern of LV diastolic filling.  3. Right ventricular volume and pressure overload.  4. Global right ventricle has normal systolic function.The right ventricular size is moderately enlarged. Mildly increased right ventricular wall thickness.  5. Left atrial size was severely dilated.  6. Right atrial size was normal.  7. Presence of pericardial fat pad.  8. Moderate aortic valve annular calcification.  9. Moderate to severe mitral annular calcification. 10. Severe calcification of the mitral valve leaflet(s). Valve appears stenotic, not completely evaluated. Suggest follow-up images with VTI measurements or planimetry if possible. 11. The mitral valve is degenerative. Mild mitral valve regurgitation. 12. The tricuspid valve is grossly normal. Tricuspid valve regurgitation is mild. 13. Aortic valve area, by VTI measures 1.83 cm. 14. Aortic valve mean gradient measures 8.8 mmHg. 15. The aortic valve is tricuspid Aortic valve regurgitation was not visualized by color flow Doppler. Mild aortic valve stenosis. 16. Moderately elevated pulmonary artery systolic pressure. 17. The tricuspid regurgitant velocity is 3.67 m/s, and with an assumed right atrial pressure of 8 mmHg, the estimated right ventricular systolic pressure is moderately elevated at 61.9 mmHg. 18. The inferior vena cava is normal in size with <50% respiratory variability, suggesting right atrial pressure of 8 mmHg.  19. The pulmonic valve was grossly normal. Pulmonic valve regurgitation is trivial by color flow Doppler.  Antimicrobials:      Subjective:  -Feels tired after ambulating, no emesis, voiding well  Objective: Vitals:   04/17/19 1314 04/17/19 2104 04/18/19 0417 04/18/19 0507  BP: (!) 117/57 (!) 121/59  116/63  Pulse: 64 61  (!)  54  Resp: 18 16  18   Temp: 98.3 F (36.8 C) 97.8 F (36.6 C)  (!) 97.5 F (36.4 C)  TempSrc: Oral Oral  Oral  SpO2: 97% 94%  94%  Weight:   76.5 kg   Height:        Intake/Output Summary (Last 24 hours) at 04/18/2019 1726 Last data filed at 04/18/2019 1100 Gross per 24 hour  Intake 720 ml  Output 1150 ml  Net -430 ml   Filed Weights   04/16/19 0500 04/17/19 0500 04/18/19 0417  Weight: 74.9 kg 76.9 kg 76.5 kg    Examination:  General exam: Alert, awake, oriented x 3 Respiratory system: Diminished breath sounds at bases. Respiratory effort normal. Cardiovascular system: Irregularly irregular,. No murmurs, rubs, gallops. Gastrointestinal system: Abdomen is ND, soft and tender in periumbilical area.Normal bowel sounds heard. Central nervous system: Generalized weakness, no new focal deficits alert and oriented.   Extremities: No C/C/E, +pedal pulses Skin: No rashes, lesions or ulcers Psychiatry: Judgement and insight appear normal. Mood & affect appropriate.   Data Reviewed:  CBC: Recent Labs  Lab 04/12/19 0452 04/14/19 0441 04/16/19 0456 04/17/19 0752  WBC 7.0 8.4 10.9* 11.1*  HGB 10.0* 10.6* 11.2* 11.1*  HCT 35.0* 36.5 38.0 37.4  MCV 90.0 89.2 88.4 86.8  PLT 203 287 425* [id number]*   Basic Metabolic Panel: Recent Labs  Lab 04/12/19 0452  04/14/19 0441 04/15/19 0435 04/16/19 0456 04/17/19 0752 04/18/19 0839  NA 137   < > 134*  134* 131* 131* 131* 135  K 4.2   < > 5.0  5.1 5.4* 5.0 5.1 5.2*  CL 103   < > 100  100  100 99 99 102  CO2 26   < > 23  25 24 24 24 25   GLUCOSE 104*   < > 101*  101* 120* 115* 117* 122*  BUN 30*   < > 42*  43* 42* 45* 48* 44*  CREATININE 1.03*   < > 1.90*  1.91* 1.89* 2.02* 1.87* 1.42*  CALCIUM 8.7*   < > 9.0  9.0 8.7* 8.8* 9.1 9.5  MG 2.1  --   --   --   --   --   --   PHOS  --   --  3.4 3.6 4.0 4.2 4.0   < > = values in this interval not displayed.   GFR: Estimated Creatinine Clearance: 29.5 mL/min (A) (by C-G formula based  on SCr of 1.42 mg/dL (H)). Liver Function Tests: Recent Labs  Lab 04/14/19 0441 04/15/19 0435 04/16/19 0456 04/17/19 0752 04/18/19 0839  AST 52*  --   --   --   --   ALT 69*  --   --   --   --   ALKPHOS 94  --   --   --   --   BILITOT 1.1  --   --   --   --   PROT 6.0*  --   --   --   --   ALBUMIN 3.3*  3.3* 3.3* 3.3* 3.4* 3.6   Recent Labs  Lab 04/13/19 1834 04/14/19 0441  LIPASE 16 16   No results for input(s): AMMONIA in the last 168 hours. Coagulation Profile: No results for input(s): INR, PROTIME in the last 168 hours. Cardiac Enzymes: No results for input(s): CKTOTAL, CKMB, CKMBINDEX, TROPONINI in the last 168 hours. BNP (last 3 results) No results for input(s): PROBNP in the last 8760 hours. HbA1C: No results for input(s): HGBA1C in the last 72 hours. CBG: Recent Labs  Lab 04/16/19 1715  GLUCAP 115*   Lipid Profile: No results for input(s): CHOL, HDL, LDLCALC, TRIG, CHOLHDL, LDLDIRECT in the last 72 hours. Thyroid Function Tests: No results for input(s): TSH, T4TOTAL, FREET4, T3FREE, THYROIDAB in the last 72 hours. Anemia Panel: No results for input(s): VITAMINB12, FOLATE, FERRITIN, TIBC, IRON, RETICCTPCT in the last 72 hours. Sepsis Labs: No results for input(s): PROCALCITON, LATICACIDVEN in the last 168 hours.  Recent Results (from the past 240 hour(s))  SARS CORONAVIRUS 2 (TAT 6-24 HRS) Nasopharyngeal Nasopharyngeal Swab     Status: None   Collection Time: 04/10/19  3:31 AM   Specimen: Nasopharyngeal Swab  Result Value Ref Range Status   SARS Coronavirus 2 NEGATIVE NEGATIVE Final    Comment: (NOTE) SARS-CoV-2 target nucleic acids are NOT DETECTED. The SARS-CoV-2 RNA is generally detectable in upper and lower respiratory specimens during the acute phase of infection. Negative results do not preclude SARS-CoV-2 infection, do not rule out co-infections with other pathogens, and should not be used as the sole basis for treatment or other patient  management decisions. Negative results must be combined with clinical observations, patient history, and epidemiological information. The expected result is Negative. Fact Sheet for Patients: SugarRoll.be Fact Sheet for Healthcare Providers: https://www.woods-mathews.com/ This test is not yet approved or cleared by the [place] FDA and  has been authorized for detection and/or diagnosis of SARS-CoV-2 by FDA under an Emergency Use Authorization (EUA). This EUA will remain  in effect (meaning this test can be used) for the duration of the COVID-19 declaration under Section 56 4(b)(1) of the Act, 21 U.S.C. section 360bbb-3(b)(1),  unless the authorization is terminated or revoked sooner. Performed at [facility], [place] [address] [place], [place] [address]   MRSA PCR Screening     Status: None   Collection Time: 04/10/19 12:00 PM   Specimen: Nasopharyngeal  Result Value Ref Range Status   MRSA by PCR NEGATIVE NEGATIVE Final    Comment:        The GeneXpert MRSA Assay (FDA approved for NASAL specimens only), is one component of a comprehensive MRSA colonization surveillance program. It is not intended to diagnose MRSA infection nor to guide or monitor treatment for MRSA infections. Performed at [facility], [address] [place], [place] [address]      Radiology Studies: No results found.  Scheduled Meds: . apixaban  2.5 mg Oral BID  . cephALEXin  500 mg Oral Q12H  . Chlorhexidine Gluconate Cloth  6 each Topical Daily  . diltiazem  360 mg Oral Daily  . feeding supplement (ENSURE ENLIVE)  237 mL Oral BID BM  . magnesium oxide  400 mg Oral TID  . memantine  5 mg Oral BID  . metoprolol tartrate  25 mg Oral BID  . pantoprazole  40 mg Oral Daily  . QUEtiapine  25 mg Oral QHS  . sertraline  50 mg Oral q morning - 10a  . sodium chloride flush  3 mL Intravenous Once  . sodium chloride flush  3 mL Intravenous Q12H    Continuous Infusions:   LOS: 8 days   Roxan Hockey, MD [facility]   If 7PM-7AM, please contact night-coverage [email or web address]  04/18/2019, 5:26 PM

## 2019-04-18 NOTE — TOC Progression Note (Addendum)
Transition of Care [facility]) - Progression Note    Patient Details  Name: Kristy Mckinney MRN: [id number] Date of Birth: [birth year]  Transition of Care [facility]) CM/SW [place], [place] Phone Number: 04/18/2019, 2:31 PM  Clinical Narrative:   Pt denied authorization this AM.  Peer to peer offered and conducted. Authorization received thanks to Dr Algis Liming. Authorization [id number].  Informed patient and daughter [name] of authorization.  They agreed to take advantage of this offer. Dr plans to discharge on Saturday.  I left a message for [name] at [employer] her on plan.  Daughter is agreeable to transport patient if patient feels up to it tomorrow.  Should be OK as I observed patient being walked down the hall by nurse [name] today.  TOC will continue to follow during the course of hospitalization.  Update after call with [name]:  Please fax COVID test done today and d/c summary to [name] of facility tomorrow.by 10:30.  [phone].  Call report to [phone].    Expected Discharge Plan: [place] Barriers to Discharge: Continued Medical Work up  Expected Discharge Plan and Services Expected Discharge Plan: [place] arrangements for the past 2 months: Apartment Expected Discharge Date: 04/12/19                                     Social Determinants of Health (SDOH) Interventions    Readmission Risk Interventions Readmission Risk Prevention Plan 11/27/2018  Post Dischage Appt Not Complete  Appt Comments cardiologist will call patient for appt.   Medication Screening Complete  Transportation Screening Complete  Some recent data might be hidden

## 2019-04-18 NOTE — Care Management Important Message (Signed)
Important Message  Patient Details  Name: Kristy Mckinney MRN: [id number] Date of Birth: 1932-04-24   Medicare Important Message Given:  Yes     Tommy Medal 04/18/2019, 12:37 PM

## 2019-04-19 DIAGNOSIS — F0391 Unspecified dementia with behavioral disturbance: Secondary | ICD-10-CM | POA: Diagnosis not present

## 2019-04-19 DIAGNOSIS — I480 Paroxysmal atrial fibrillation: Secondary | ICD-10-CM | POA: Diagnosis not present

## 2019-04-19 DIAGNOSIS — R69 Illness, unspecified: Secondary | ICD-10-CM | POA: Diagnosis not present

## 2019-04-19 DIAGNOSIS — E875 Hyperkalemia: Secondary | ICD-10-CM | POA: Diagnosis not present

## 2019-04-19 DIAGNOSIS — F411 Generalized anxiety disorder: Secondary | ICD-10-CM | POA: Diagnosis not present

## 2019-04-19 DIAGNOSIS — R001 Bradycardia, unspecified: Secondary | ICD-10-CM | POA: Diagnosis not present

## 2019-04-19 DIAGNOSIS — R41841 Cognitive communication deficit: Secondary | ICD-10-CM | POA: Diagnosis not present

## 2019-04-19 DIAGNOSIS — M6281 Muscle weakness (generalized): Secondary | ICD-10-CM | POA: Diagnosis not present

## 2019-04-19 DIAGNOSIS — I4819 Other persistent atrial fibrillation: Secondary | ICD-10-CM | POA: Diagnosis not present

## 2019-04-19 DIAGNOSIS — J918 Pleural effusion in other conditions classified elsewhere: Secondary | ICD-10-CM | POA: Diagnosis not present

## 2019-04-19 DIAGNOSIS — K859 Acute pancreatitis without necrosis or infection, unspecified: Secondary | ICD-10-CM | POA: Diagnosis not present

## 2019-04-19 DIAGNOSIS — I959 Hypotension, unspecified: Secondary | ICD-10-CM | POA: Diagnosis not present

## 2019-04-19 DIAGNOSIS — I4811 Longstanding persistent atrial fibrillation: Secondary | ICD-10-CM | POA: Diagnosis not present

## 2019-04-19 DIAGNOSIS — R5381 Other malaise: Secondary | ICD-10-CM | POA: Diagnosis not present

## 2019-04-19 DIAGNOSIS — N179 Acute kidney failure, unspecified: Secondary | ICD-10-CM | POA: Diagnosis not present

## 2019-04-19 DIAGNOSIS — R2689 Other abnormalities of gait and mobility: Secondary | ICD-10-CM | POA: Diagnosis not present

## 2019-04-19 DIAGNOSIS — I5031 Acute diastolic (congestive) heart failure: Secondary | ICD-10-CM | POA: Diagnosis not present

## 2019-04-19 DIAGNOSIS — M545 Low back pain: Secondary | ICD-10-CM | POA: Diagnosis not present

## 2019-04-19 DIAGNOSIS — Z743 Need for continuous supervision: Secondary | ICD-10-CM | POA: Diagnosis not present

## 2019-04-19 DIAGNOSIS — I5032 Chronic diastolic (congestive) heart failure: Secondary | ICD-10-CM | POA: Diagnosis not present

## 2019-04-19 LAB — SARS CORONAVIRUS 2 BY RT PCR (HOSPITAL ORDER, PERFORMED IN ~~LOC~~ HOSPITAL LAB): SARS Coronavirus 2: NEGATIVE

## 2019-04-19 LAB — BASIC METABOLIC PANEL
Anion gap: 7 (ref 5–15)
BUN: 39 mg/dL — ABNORMAL HIGH (ref 8–23)
CO2: 24 mmol/L (ref 22–32)
Calcium: 9.1 mg/dL (ref 8.9–10.3)
Chloride: 102 mmol/L (ref 98–111)
Creatinine, Ser: 1.24 mg/dL — ABNORMAL HIGH (ref 0.44–1.00)
GFR calc Af Amer: 46 mL/min — ABNORMAL LOW (ref >60)
GFR calc non Af Amer: 40 mL/min — ABNORMAL LOW (ref >60)
Glucose, Bld: 110 mg/dL — ABNORMAL HIGH (ref 70–99)
Potassium: 4.9 mmol/L (ref 3.5–5.1)
Sodium: 133 mmol/L — ABNORMAL LOW (ref 135–145)

## 2019-04-19 MED ORDER — ONDANSETRON HCL 4 MG PO TABS: 4.0000 mg | ORAL_TABLET | Freq: Three times a day (TID) | ORAL | 0 refills | Status: DC | PRN

## 2019-04-19 MED ORDER — MIRTAZAPINE 7.5 MG PO TABS: 7.5000 mg | ORAL_TABLET | Freq: Every day | ORAL | 3 refills | Status: DC

## 2019-04-19 MED ORDER — PANTOPRAZOLE SODIUM 40 MG PO TBEC: 40.0000 mg | DELAYED_RELEASE_TABLET | Freq: Every day | ORAL | 3 refills | Status: DC

## 2019-04-19 MED ORDER — CEPHALEXIN 500 MG PO CAPS: 500.0000 mg | ORAL_CAPSULE | Freq: Three times a day (TID) | ORAL | 0 refills | Status: AC

## 2019-04-19 MED ORDER — SODIUM CHLORIDE 0.9 % IV SOLN
1.0000 g | Freq: Once | INTRAVENOUS | Status: AC
  Administered 2019-04-19: 09:00:00 1 g via INTRAVENOUS
  Filled 2019-04-19: qty 10

## 2019-04-19 MED ORDER — FLUCONAZOLE 100 MG PO TABS
200.0000 mg | ORAL_TABLET | Freq: Once | ORAL | Status: AC
  Administered 2019-04-19: 200 mg via ORAL
  Filled 2019-04-19: qty 2

## 2019-04-19 NOTE — Progress Notes (Signed)
IV removed, 2x2 gauze and paper tape applied to site, patient tolerated well.  Report called to Bethena Roys at [facility] and all questions answered.  Patient transported to facility via [facility].  Patient's daughter aware of patient being transported to nursing facility.

## 2019-04-19 NOTE — TOC Transition Note (Signed)
Transition of Care [facility]) - CM/SW Discharge Note   Patient Details  Name: Kristy Mckinney MRN: [id number] Date of Birth: 1932/01/02  Transition of Care [facility]) CM/SW Contact:  Marshell Garfinkel, RN Phone Number: 04/19/2019, 12:36 PM   Clinical Narrative:     RNCM spoke with [facility] [phone] regarding transfer to them today. Faxed covid results to [phone]. Patient will go to room 151.   Final next level of care: Skilled Nursing Facility Barriers to Discharge: No Barriers Identified   Patient Goals and CMS Choice Patient states their goals for this hospitalization and ongoing recovery are:: To return home and be well.      Discharge Placement              Patient chooses bed at: [facility] Patient to be transferred to facility by: family-daughter Name of family member notified: [name] Patient and family notified of of transfer: 04/18/19  Discharge Plan and Services                                     Social Determinants of Health (SDOH) Interventions     Readmission Risk Interventions Readmission Risk Prevention Plan 11/27/2018  Post Dischage Appt Not Complete  Appt Comments cardiologist will call patient for appt.   Medication Screening Complete  Transportation Screening Complete  Some recent data might be hidden

## 2019-04-19 NOTE — TOC Progression Note (Addendum)
Transition of Care [facility]) - Progression Note    Patient Details  Name: MALEEHA GRANNIS MRN: [id number] Date of Birth: [birth year]  Transition of Care [facility]) CM/SW Contact  Marshell Garfinkel, RN Phone Number: 04/19/2019, 9:47 AM  Clinical Narrative:    Spoke with RN covering patient and patient's last Covid was 10/1. RN will get another one. I've updated [facility] [phone] and will fax results to [phone] when available. Insurance Authorization [id number] per previous TOC. [name] daughter [phone] aware and will provide transportation to SNF.    Expected Discharge Plan: [place] [facility] Barriers to Discharge: Other (comment)(Covid 10/1 (has to be within 72 hours))  Expected Discharge Plan and Services Expected Discharge Plan: [place] arrangements for the past 2 months: Apartment Expected Discharge Date: 04/19/19                                     Social Determinants of Health (SDOH) Interventions    Readmission Risk Interventions Readmission Risk Prevention Plan 11/27/2018  Post Dischage Appt Not Complete  Appt Comments cardiologist will call patient for appt.   Medication Screening Complete  Transportation Screening Complete  Some recent data might be hidden

## 2019-04-19 NOTE — Discharge Summary (Signed)
Kristy Mckinney, is a 85 y.o. female  DOB 1931/08/03  MRN [id number].  Admission date:  04/10/2019  Admitting Physician  Jani Gravel, MD  Discharge Date:  04/19/2019   Primary MD  [name], MD  Recommendations for primary care physician for things to follow:  - 1) you are taking Eliquis/apixaban which is your blood thinner so Avoid ibuprofen/Advil/Aleve/Motrin/Goody Powders/Naproxen/BC powders/Meloxicam/Diclofenac/Indomethacin and other Nonsteroidal anti-inflammatory medications as these will make you more likely to bleed and can cause stomach ulcers, can also cause Kidney problems.   2)Repeat BMP and CBC test in a week advised  3) adequate fluid intake and nutritional supplements advised  4)Please  follow-up with cardiologist in 3 to 4 weeks  5) consider assisted living facility or having more help consistently at home after your discharge from rehab  Admission Diagnosis  Acute hyperkalemia [E87.5] Bradycardia [R00.1] ARF (acute renal failure) (HCC) [N17.9] Generalized weakness [R53.1] Hypotension, unspecified hypotension type [I95.9] Non-intractable vomiting with nausea, unspecified vomiting type [R11.2]   Discharge Diagnosis  Acute hyperkalemia [E87.5] Bradycardia [R00.1] ARF (acute renal failure) (HCC) [N17.9] Generalized weakness [R53.1] Hypotension, unspecified hypotension type [I95.9] Non-intractable vomiting with nausea, unspecified vomiting type [R11.2]    Principal Problem:   Bradycardia Active Problems:   Dementia (HCC)   A-fib (HCC)   Hypotension   Hyperkalemia   ARF (acute renal failure) (HCC)   Pleural effusion   Pancreatitis, acute   Acute respiratory failure with hypoxia (HCC)   Palliative care by specialist   Goals of care, counseling/discussion   Generalized weakness     Past Medical History:  Diagnosis Date   Atrial fibrillation ([place])    Chronic back  pain    Diarrhea    Dysphagia 05/03/2016   GERD (gastroesophageal reflux disease)     Past Surgical History:  Procedure Laterality Date   ABDOMINAL HYSTERECTOMY     BACK SURGERY     CHOLECYSTECTOMY N/A 01/29/2014   Procedure: LAPAROSCOPIC CHOLECYSTECTOMY;  Surgeon: Scherry Ran, MD;  Location: [facility];  Service: General;  Laterality: N/A;   COLONOSCOPY N/A 10/27/2015   Procedure: COLONOSCOPY;  Surgeon: Rogene Houston, MD;  [facility];  Service: Endoscopy;  Laterality: N/A;  215   INTRAMEDULLARY (IM) NAIL INTERTROCHANTERIC Right 11/29/2018   Procedure: INTRAMEDULLARY (IM) NAIL INTERTROCHANTRIC FRACTURE;  Surgeon: Renette Butters, MD;  Location: [facility];  Service: Orthopedics;  Laterality: Right;   TONSILLECTOMY     YAG LASER APPLICATION Left [id number]   Procedure: YAG LASER APPLICATION;  Surgeon: Elta Guadeloupe T. Gershon Crane, MD;  Location: [facility];  Service: Ophthalmology;  Laterality: Left;  left     HPI  from the history and physical done on the day of admission:    -  Kristy Mckinney  is a 85 y.o. female, w persistent atrial fibrillation , mitral stenosis , CHF (diastolic), and chronic back pain, w recent admission where cardizem was increased from 240 to 360mg  po qday, and  Started on metoprolol 25mg  po bid, apparently presents with c/o n/v starting  yesterday.   Pt was noted to be hypotensive by EMS with sbp 50's, and given Ns 541mL iv x1  In ED,  T afebrile P 51, R 22, Bp 71/46  Pox 94% on RA Wt 62.1kg  Na 132, K 6.7, Bun 36, Creatinine 2.01 Wbc 10.3, Hgb 11.6, Plt 273  EKG- Afib at 46, LAD, Q in v1-3  Pt will be admitted for bradycardia, hypotension, and acute renal failure w hyperkalemia.     Hospital Course:    -Brief Narrative:  85 year old female who was admitted to the hospital with probable viral gastroenteritis and severe dehydration with hyperkalemia and acute renal failure.  She was also bradycardic which was felt to be related to the  hyperkalemia as well as her rate control medications for atrial fibrillation.  She was hydrated with IV fluids with improvement of renal function.  Rate control medication was initially held, but then resumed as heart rate improved.  Unfortunately, her GI symptoms persisted.  CT scan performed shows possible pancreatitis.  She is also developed shortness of breath and imaging indicates bilateral pleural effusions.  Further work-up in process.  A/P -1)Acute renal failure--  Related to volume depletion in the setting of GI losses.    Renal ultrasound and CT abdomen and pelvis without obstructive uropathy -Initially treated with IV fluids, once volume status improved significantly IV fluids were discontinued --Last known EF over 70%   Pt has evidence of pleural effusions,  --Nephrology consult appreciated -Creatinine continues to trend down, creatinine was 1.0 on 04/12/2019, creatinine on admission was 2.0, baseline creatinine 0.8 -Discharge creatinine 1.24 -Repeat BMP within a week advised   2)Hyperkalemia--.  Potassium was 6.7 on admission related to renal failure.  Was likely contributing to bradycardia as well too.  -Patient required at least 3 doses of Lokelma this admission, discharge potassium 4.9 -Repeat BMP within a week advised  3)Permanent Atrial fibrillation --- patient had bradycardia initially on admission in the setting of severe hyperkalemia.  Cardizem and metoprolol held on admission.  Overall hyperkalemia had also improved.  Cardizem was restarted and she is back on her home dose of 360 mg daily.  She was also restarted on her home dose of metoprolol   Continue Eliquis 5 mg twice daily for for  Full anticoagulation for atrial fibrillation--cardiology input appreciated  4)Nausea and vomiting/abdominal pain / pancreatitis /duodenitis--CT abdomen and pelvis findings noted  --Tolerating diet better,,   She is already status post cholecystectomy.  -Encourage soft diet -Remeron for  appetite stimulation -Protonix for duodenitis  5)acute hypoxic respiratory failure secondary to bilateral pleural effusions.  Likely precipitated by IV fluid hydration for initial acute renal failure.  -Hypoxia has resolved  6)HFpEF--  Patient has evidence of pleural effusions.  Most recent echocardiogram from 03/2019 shows ejection fraction of > 70%.   Bilateral pleural effusions.  Likely related to IV hydration.  -Respiratory status is stable, no tachypnea and no hypoxia no indication for thoracentesis at this time.  7)Generalized weakness and Debility--physical therapy evaluation recommends SNF rehab  8)Social/Ethics--palliative care consult for goals of care appreciated, patient is a full code  9)Candidal Vulvovaginitis and UTI--patient received Diflucan, IV Rocephin given, discharged on Keflex pending urine culture results  DVT prophylaxis: Apixaban Code Status: Full code Family Communication:    Discussed with daughter Ms Leda Min  [facility] initially declined SNF placement, Peer to Peer review appears successful--  discharge to SNF on 04/19/2019  Consultants:   Cardiology/nephrology  Discharge Condition: stable  Follow UP   Contact information for follow-up providers    [name], MD Follow up.   Specialty: Pulmonary Disease Why: as scheduled Contact information: [address] [place] [address] [phone]        Makenah, Leaders, PA-C Follow up on 04/24/2019.   Specialties: Physician Assistant, Cardiology Contact information: [address] [place] [address] [phone]            Contact information for after-discharge care    Destination    [facility] .   Service: Skilled Nursing Contact information: [address] [place] [phone]                 Diet and Activity recommendation:  As  advised  Discharge Instructions    Discharge Instructions    Call MD for:  difficulty breathing, headache or visual disturbances   Complete by: As directed    Call MD for:  persistant dizziness or light-headedness   Complete by: As directed    Call MD for:  persistant nausea and vomiting   Complete by: As directed    Call MD for:  severe uncontrolled pain   Complete by: As directed    Call MD for:  temperature >100.4   Complete by: As directed    Diet - low sodium heart healthy   Complete by: As directed    Soft diet encouraged   Diet - low sodium heart healthy   Complete by: As directed    Discharge instructions   Complete by: As directed    1) you are taking Eliquis/apixaban which is your blood thinner so Avoid ibuprofen/Advil/Aleve/Motrin/Goody Powders/Naproxen/BC powders/Meloxicam/Diclofenac/Indomethacin and other Nonsteroidal anti-inflammatory medications as these will make you more likely to bleed and can cause stomach ulcers, can also cause Kidney problems.   2)Repeat BMP and CBC test in a week advised  3) adequate fluid intake and nutritional supplements advised  4)Please  follow-up with cardiologist in 3 to 4 weeks  5) consider assisted living facility or having more help consistently at home after your discharge from rehab   Increase activity slowly   Complete by: As directed    Increase activity slowly   Complete by: As directed        Discharge Medications     Allergies as of 04/19/2019      Reactions   Penicillins Anaphylaxis   Emetrol Nausea Only   swelling   Feldene [piroxicam] Nausea And Vomiting   Sulfa Antibiotics    swelling      Medication List    STOP taking these medications   diphenoxylate-atropine 2.5-0.025 MG tablet Commonly known as: LOMOTIL   docusate sodium 100 MG capsule Commonly known as: COLACE   omeprazole 20 MG capsule Commonly known as: PRILOSEC Replaced by: pantoprazole 40 MG tablet     TAKE these medications     acetaminophen 325 MG tablet Commonly known as: TYLENOL Take 650 mg by mouth every 6 (six) hours as needed.   alendronate 70 MG tablet Commonly known as: FOSAMAX Take 1 tablet (70 mg total) by mouth once a week. Take with a full glass of water on an empty stomach.   apixaban 5 MG Tabs tablet Commonly known as: ELIQUIS Take 1 tablet (5 mg total) by mouth 2 (two) times daily.   calcium-vitamin D 500-200 MG-UNIT tablet Commonly known as: Os-Cal 500 + D Take 1 tablet by mouth 2 (two) times daily.  cephALEXin 500 MG capsule Commonly known as: Keflex Take 1 capsule (500 mg total) by mouth 3 (three) times daily for 5 days. For UTI   diltiazem 360 MG 24 hr capsule Commonly known as: CARDIZEM CD Take 1 capsule (360 mg total) by mouth daily.   feeding supplement (ENSURE ENLIVE) Liqd Take 237 mLs by mouth 2 (two) times daily between meals.   magnesium oxide 400 MG tablet Commonly known as: MAG-OX Take 1 tablet (400 mg total) by mouth 3 (three) times daily.   memantine 5 MG tablet Commonly known as: NAMENDA Take 1 tablet (5 mg total) by mouth 2 (two) times daily.   metoprolol tartrate 25 MG tablet Commonly known as: LOPRESSOR Take 1 tablet (25 mg total) by mouth 2 (two) times daily.   mirtazapine 7.5 MG tablet Commonly known as: Remeron Take 1 tablet (7.5 mg total) by mouth at bedtime.   multivitamin with minerals Tabs tablet Take 1 tablet by mouth daily.   NON FORMULARY Diet Type: NAS   ondansetron 4 MG tablet Commonly known as: ZOFRAN Take 1 tablet (4 mg total) by mouth every 8 (eight) hours as needed for nausea or vomiting.   pantoprazole 40 MG tablet Commonly known as: PROTONIX Take 1 tablet (40 mg total) by mouth daily. Replaces: omeprazole 20 MG capsule   polyethylene glycol 17 g packet Commonly known as: MIRALAX / GLYCOLAX Take 17 g by mouth daily as needed for mild constipation.   QUEtiapine 25 MG tablet Commonly known as: SEROQUEL Take 1 tablet (25 mg  total) by mouth at bedtime.   sertraline 50 MG tablet Commonly known as: ZOLOFT Take 1 tablet (50 mg total) by mouth every morning.   Skin Prep Wipes Misc Apply to bilateral heels every shift for prevention   SYSTANE OP Place 1 drop into both eyes daily as needed. Dry Eyes   Venelex Oint Apply topically to sacrum and bilateral buttocks every shift and as needed for prevention       Major procedures and Radiology Reports - PLEASE review detailed and final reports for all details, in brief -   Ct Abdomen Pelvis Wo Contrast  Result Date: 04/13/2019 CLINICAL DATA:  85 year old female with history of abdominal distension and, nausea and vomiting. EXAM: CT ABDOMEN AND PELVIS WITHOUT CONTRAST TECHNIQUE: Multidetector CT imaging of the abdomen and pelvis was performed following the standard protocol without IV contrast. COMPARISON:  No priors. FINDINGS: Lower chest: Large right and moderate left pleural effusions lying dependently with extensive areas of passive atelectasis in the lung bases bilaterally. Mild cardiomegaly. Atherosclerotic calcifications in the descending thoracic aorta as well as the left main, left anterior descending, left circumflex and right coronary arteries. Severe calcifications of the aortic valve and mitral annulus. Moderate to large hiatal hernia Hepatobiliary: No definite suspicious cystic or solid hepatic lesions are confidently identified on today's noncontrast CT examination. Status post cholecystectomy. Pancreas: No definite pancreatic mass. Mild peripancreatic stranding. Spleen: Trace amount of perisplenic fluid, otherwise unremarkable. Adrenals/Urinary Tract: 2 mm nonobstructive calculus in the upper pole collecting system of the right kidney and 3 mm nonobstructive calculus in the lower pole collecting system of the left kidney. Unenhanced appearance of the kidneys and bilateral adrenal glands is otherwise unremarkable. No hydroureteronephrosis. Urinary bladder is  nearly decompressed, but otherwise unremarkable in appearance. Stomach/Bowel: Normal appearance of the stomach. Mural thickening in the duodenum, most evident in the second portion of the duodenum, with surrounding inflammatory changes. No pathologic dilatation of small bowel or colon.  Numerous colonic diverticulae are noted, particularly in the sigmoid colon. Appendix is not confidently identified, likely surgically absent. Vascular/Lymphatic: Aortic atherosclerosis. No lymphadenopathy noted in the abdomen or pelvis. Reproductive: Status post hysterectomy. Ovaries are not confidently identified and may be surgically absent or atrophic. Other: Small volume of ascites.  No pneumoperitoneum. Musculoskeletal: Chronic appearing compression fracture of L4 with 70% loss of central vertebral body height. There are no aggressive appearing lytic or blastic lesions noted in the visualized portions of the skeleton. Postoperative changes of ORIF in the proximal right femur. IMPRESSION: 1. Inflammatory changes centered around the pancreas and duodenum, concerning for acute pancreatitis and/or acute duodenitis. 2. Small volume of ascites. 3. Large right and moderate left pleural effusions lying dependently with some associated passive atelectasis in the lung bases bilaterally. 4. Tiny 2-3 mm nonobstructive calculi in the collecting systems of both kidneys. No ureteral stones or findings of urinary tract obstruction are noted at this time. 5. Mild cardiomegaly. 6. Aortic atherosclerosis, in addition to left main and 3 vessel coronary artery disease. 7. There are calcifications of the aortic valve and mitral annulus. Echocardiographic correlation for evaluation of potential valvular dysfunction may be warranted if clinically indicated. 8. Moderate to large hiatal hernia. 9. Additional incidental findings, as above. Electronically Signed   By: Vinnie Langton M.D.   On: 04/13/2019 16:20   US Renal  Result Date:  04/10/2019 CLINICAL DATA:  Acute renal failure EXAM: RENAL / URINARY TRACT ULTRASOUND COMPLETE COMPARISON:  05/11/2015 FINDINGS: Right Kidney: Renal measurements: 10.5 x 4.8 x 5.4 cm. = volume: 143 mL . Echogenicity within normal limits. No mass or hydronephrosis visualized. Left Kidney: Renal measurements: 11.4 x 4.7 x 6.3 cm. = volume: 177 mL. Echogenicity within normal limits. No mass or hydronephrosis visualized. Bladder: Decompressed IMPRESSION: Normal appearing renal ultrasound. Electronically Signed   By: Inez Catalina M.D.   On: 04/10/2019 09:08   US Venous Img Lower Bilateral  Result Date: 04/04/2019 CLINICAL DATA:  Lower extremity pain. EXAM: BILATERAL LOWER EXTREMITY VENOUS DOPPLER ULTRASOUND TECHNIQUE: Gray-scale sonography with graded compression, as well as color Doppler and duplex ultrasound were performed to evaluate the lower extremity deep venous systems from the level of the common femoral vein and including the common femoral, femoral, profunda femoral, popliteal and calf veins including the posterior tibial, peroneal and gastrocnemius veins when visible. The superficial great saphenous vein was also interrogated. Spectral Doppler was utilized to evaluate flow at rest and with distal augmentation maneuvers in the common femoral, femoral and popliteal veins. COMPARISON:  None. FINDINGS: RIGHT LOWER EXTREMITY Common Femoral Vein: No evidence of thrombus. Normal compressibility, respiratory phasicity and response to augmentation. Saphenofemoral Junction: No evidence of thrombus. Normal compressibility and flow on color Doppler imaging. Profunda Femoral Vein: No evidence of thrombus. Normal compressibility and flow on color Doppler imaging. Femoral Vein: No evidence of thrombus. Normal compressibility, respiratory phasicity and response to augmentation. Popliteal Vein: No evidence of thrombus. Normal compressibility, respiratory phasicity and response to augmentation. Calf Veins: No evidence of  thrombus. Normal compressibility and flow on color Doppler imaging. Superficial Great Saphenous Vein: No evidence of thrombus. Normal compressibility. Venous Reflux:  None. Other Findings: No evidence of superficial thrombophlebitis or abnormal fluid collection. LEFT LOWER EXTREMITY Common Femoral Vein: No evidence of thrombus. Normal compressibility, respiratory phasicity and response to augmentation. Saphenofemoral Junction: No evidence of thrombus. Normal compressibility and flow on color Doppler imaging. Profunda Femoral Vein: No evidence of thrombus. Normal compressibility and flow on color Doppler imaging. Femoral  Vein: No evidence of thrombus. Normal compressibility, respiratory phasicity and response to augmentation. Popliteal Vein: No evidence of thrombus. Normal compressibility, respiratory phasicity and response to augmentation. Calf Veins: No evidence of thrombus. Normal compressibility and flow on color Doppler imaging. Superficial Great Saphenous Vein: No evidence of thrombus. Normal compressibility. Venous Reflux:  None. Other Findings: No evidence of superficial thrombophlebitis or abnormal fluid collection. IMPRESSION: No evidence of deep venous thrombosis in either lower extremity. Electronically Signed   By: Aletta Edouard M.D.   On: 04/04/2019 18:28   Dg Chest Port 1 View  Result Date: 04/13/2019 CLINICAL DATA:  Shortness of breath, abdominal pain EXAM: PORTABLE CHEST 1 VIEW COMPARISON:  04/04/2019 FINDINGS: Slight interval increase in layering right pleural effusion and associated atelectasis or consolidation. There may be an additional small left pleural effusion. Mild diffuse interstitial pulmonary opacity, likely edema, similar prior examination. No focal airspace opacity. Cardiomegaly. IMPRESSION: 1. Slight interval increase in layering right pleural effusion and associated atelectasis or consolidation. There may be an additional small left pleural effusion. 2. Mild diffuse interstitial  pulmonary opacity, likely edema, similar to prior examination. No focal airspace opacity. 3.  Cardiomegaly. Electronically Signed   By: Eddie Candle M.D.   On: 04/13/2019 14:48   Dg Chest Portable 1 View  Result Date: 04/04/2019 CLINICAL DATA:  Shortness of breath EXAM: PORTABLE CHEST 1 VIEW COMPARISON:  Nov 28, 2018 FINDINGS: The cardiac size remains enlarged. Aortic calcifications are noted. There are small bilateral pleural effusions, right greater than left. There prominent interstitial lung markings, similar to prior study. Bibasilar atelectasis is again noted. IMPRESSION: Stable appearance of the chest with persistent congestive heart failure. Electronically Signed   By: Constance Holster M.D.   On: 04/04/2019 07:59    Micro Results   Recent Results (from the past 240 hour(s))  SARS CORONAVIRUS 2 (TAT 6-24 HRS) Nasopharyngeal Nasopharyngeal Swab     Status: None   Collection Time: 04/10/19  3:31 AM   Specimen: Nasopharyngeal Swab  Result Value Ref Range Status   SARS Coronavirus 2 NEGATIVE NEGATIVE Final    Comment: (NOTE) SARS-CoV-2 target nucleic acids are NOT DETECTED. The SARS-CoV-2 RNA is generally detectable in upper and lower respiratory specimens during the acute phase of infection. Negative results do not preclude SARS-CoV-2 infection, do not rule out co-infections with other pathogens, and should not be used as the sole basis for treatment or other patient management decisions. Negative results must be combined with clinical observations, patient history, and epidemiological information. The expected result is Negative. Fact Sheet for Patients: SugarRoll.be Fact Sheet for Healthcare Providers: https://www.woods-mathews.com/ This test is not yet approved or cleared by the [place] FDA and  has been authorized for detection and/or diagnosis of SARS-CoV-2 by FDA under an Emergency Use Authorization (EUA). This EUA will remain   in effect (meaning this test can be used) for the duration of the COVID-19 declaration under Section 56 4(b)(1) of the Act, 21 U.S.C. section 360bbb-3(b)(1), unless the authorization is terminated or revoked sooner. Performed at [facility], [place] [address] [place], [place] [address]   MRSA PCR Screening     Status: None   Collection Time: 04/10/19 12:00 PM   Specimen: Nasopharyngeal  Result Value Ref Range Status   MRSA by PCR NEGATIVE NEGATIVE Final    Comment:        The GeneXpert MRSA Assay (FDA approved for NASAL specimens only), is one component of a comprehensive MRSA colonization surveillance program. It  is not intended to diagnose MRSA infection nor to guide or monitor treatment for MRSA infections. Performed at [facility], [address], [place], [place] [address]     Today   [place] today has no new complaints,  -Voiding well without dysuria, -Tolerating oral intake, no dysuria          Patient has been seen and examined prior to discharge   Objective   Blood pressure (!) 116/44, pulse 68, temperature 97.8 F (36.6 C), temperature source Oral, resp. rate 16, height 5\' 4"  (1.626 m), weight 76.5 kg, SpO2 93 %.   Intake/Output Summary (Last 24 hours) at 04/19/2019 0950 Last data filed at 04/19/2019 0653 Gross per 24 hour  Intake 480 ml  Output 650 ml  Net -170 ml   Exam Gen:- Awake Alert, no acute distress  HEENT:- [place].AT, No sclera icterus Neck-Supple Neck,No JVD,.  Lungs-  CTAB , good air movement bilaterally  CV- S1, S2 normal, regular Abd-  +ve B.Sounds, Abd Soft, No tenderness, no CVA tenderness    Extremity/Skin:- No  edema,   good pulses Psych-affect is appropriate, oriented x3 Neuro-generalized weakness, no new focal deficits, no tremors    Data Review   CBC w Diff:  Lab Results  Component Value Date   WBC 11.1 (H) 04/17/2019   HGB 11.1 (L) 04/17/2019   HCT 37.4 04/17/2019   PLT 423 (H) 04/17/2019    LYMPHOPCT 14 02/28/2019   MONOPCT 7 02/28/2019   EOSPCT 1 02/28/2019   BASOPCT 0 02/28/2019    CMP:  Lab Results  Component Value Date   NA 133 (L) 04/19/2019   K 4.9 04/19/2019   CL 102 04/19/2019   CO2 24 04/19/2019   BUN 39 (H) 04/19/2019   CREATININE 1.24 (H) 04/19/2019   PROT 6.0 (L) 04/14/2019   ALBUMIN 3.6 04/18/2019   BILITOT 1.1 04/14/2019   ALKPHOS 94 04/14/2019   AST 52 (H) 04/14/2019   ALT 69 (H) 04/14/2019  .   Total Discharge time is about 33 minutes  Roxan Hockey M.D on 04/19/2019 at 9:50 AM  Go to [email or web address] -  for contact info  [facility] - Office  [phone]

## 2019-04-19 NOTE — Discharge Instructions (Signed)
1) you are taking Eliquis/apixaban which is your blood thinner so Avoid ibuprofen/Advil/Aleve/Motrin/Goody Powders/Naproxen/BC powders/Meloxicam/Diclofenac/Indomethacin and other Nonsteroidal anti-inflammatory medications as these will make you more likely to bleed and can cause stomach ulcers, can also cause Kidney problems.   2)Repeat BMP and CBC test in a week advised  3) adequate fluid intake and nutritional supplements advised  4)Please  follow-up with cardiologist in 3 to 4 weeks  5) consider assisted living facility or having more help consistently at home after your discharge from rehab

## 2019-04-20 DIAGNOSIS — I480 Paroxysmal atrial fibrillation: Secondary | ICD-10-CM | POA: Diagnosis not present

## 2019-04-20 DIAGNOSIS — M545 Low back pain: Secondary | ICD-10-CM | POA: Diagnosis not present

## 2019-04-20 DIAGNOSIS — I5032 Chronic diastolic (congestive) heart failure: Secondary | ICD-10-CM | POA: Diagnosis not present

## 2019-04-20 DIAGNOSIS — F411 Generalized anxiety disorder: Secondary | ICD-10-CM | POA: Diagnosis not present

## 2019-04-21 LAB — URINE CULTURE
Culture: >=100000 — AB
Special Requests: NORMAL

## 2019-04-22 ENCOUNTER — Telehealth: Payer: Self-pay | Admitting: Cardiology

## 2019-04-22 ENCOUNTER — Other Ambulatory Visit: Payer: Self-pay

## 2019-04-22 ENCOUNTER — Telehealth (INDEPENDENT_AMBULATORY_CARE_PROVIDER_SITE_OTHER): Payer: PPO | Admitting: Student

## 2019-04-22 ENCOUNTER — Encounter: Payer: Self-pay | Admitting: Student

## 2019-04-22 VITALS — Wt 159.0 lb

## 2019-04-22 DIAGNOSIS — I4819 Other persistent atrial fibrillation: Secondary | ICD-10-CM | POA: Diagnosis not present

## 2019-04-22 DIAGNOSIS — Z79899 Other long term (current) drug therapy: Secondary | ICD-10-CM

## 2019-04-22 DIAGNOSIS — N170 Acute kidney failure with tubular necrosis: Secondary | ICD-10-CM

## 2019-04-22 DIAGNOSIS — I342 Nonrheumatic mitral (valve) stenosis: Secondary | ICD-10-CM

## 2019-04-22 DIAGNOSIS — R600 Localized edema: Secondary | ICD-10-CM

## 2019-04-22 NOTE — Patient Instructions (Signed)
Medication Instructions:  Your physician has recommended you make the following change in your medication:  Take Lasix 40 mg 3 days Only   If you need a refill on your cardiac medications before your next appointment, please call your pharmacy.   Lab work: Your physician recommends that you return for lab work in: BMET in 1 Week   If you have labs (blood work) drawn today and your tests are completely normal, you will receive your results only by: Marland Kitchen MyChart Message (if you have MyChart) OR . A paper copy in the mail If you have any lab test that is abnormal or we need to change your treatment, we will call you to review the results.  Testing/Procedures: NONE   Follow-Up: At [facility], you and your health needs are our priority.  As part of our continuing mission to provide you with exceptional heart care, we have created designated Provider Care Teams.  These Care Teams include your primary Cardiologist (physician) and Advanced Practice Providers (APPs -  Physician Assistants and Nurse Practitioners) who all work together to provide you with the care you need, when you need it. You will need a follow up appointment in 2 weeks.  Please call our office 2 months in advance to schedule this appointment.  You may see Rozann Lesches, MD or one of the following Advanced Practice Providers on your designated Care Team:   Bernerd Pho, PA-C [facility]) . Ermalinda Barrios, PA-C ([place])  Any Other Special Instructions Will Be Listed Below (If Applicable). Thank you for choosing [facility]!

## 2019-04-22 NOTE — Telephone Encounter (Addendum)
Spoke with Bernerd Pho, PA-C. She asked to switch pt to VV today. Called [facility] and spoke with nurse on [employer]. She stated to call @ 3 and she will transfer call to pt's room. They are going to fax bp, weights and medication list ahead of time. Called daughter to advise.

## 2019-04-22 NOTE — Progress Notes (Signed)
Virtual Visit via Telephone Note   This visit type was conducted due to national recommendations for restrictions regarding the COVID-19 Pandemic (e.g. social distancing) in an effort to limit this patient's exposure and mitigate transmission in our community.  Due to her co-morbid illnesses, this patient is at least at moderate risk for complications without adequate follow up.  This format is felt to be most appropriate for this patient at this time.  The patient did not have access to video technology/had technical difficulties with video requiring transitioning to audio format only (telephone).  All issues noted in this document were discussed and addressed.  No physical exam could be performed with this format.  Please refer to the patient's chart for her  consent to telehealth for [facility].   Date:  04/22/2019   ID:  Kristy Mckinney, DOB March 17, 1932, MRN [id number]  Patient Location: [place] Provider Location: Office  PCP:  [name], MD  Cardiologist:  Rozann Lesches, MD  Electrophysiologist:  None   Evaluation Performed:  Follow-Up Visit  Chief Complaint: Worsening Edema and Weight Gain  History of Present Illness:    Kristy Mckinney is a 85 y.o. female with past medical history of permanent atrial fibrillation (diagnosed in 11/2018), mitral stenosis, and chronic back pain who presents for a hospital follow-up telehealth visit.   She was admitted to [facility] from 9/25 - 04/08/2019 for a CHF exacerbation felt to be secondary to atrial fibrillation with RVR. Cardizem was titrated to 360mg  daily and she was started on Lopressor 25mg  BID. Weight was 136 lbs and she was not started on PO diuretics at the time of discharge.   She presented back to the ED on 04/10/2019 for evaluation and nausea and vomiting, found to have hyperkalemia with K+ at 6.7 and AKI with creatinine at 2.01 (was 0.85 at the time of recent hospital discharge). Abdominal CT scan  showed inflammatory changes centered around the pancreas and duodenum, concerning for acute pancreatitis and/or acute duodenitis. Did not require surgical intervention but was started on Protonix. She was bradycardiac initially, therefore Lopressor was discontinued and she was started on short-acting Cardizem to assess HR and BP response. Was eventually restarted on Cardizem CD 360mg  daily and Lopressor 25mg  BID. Her AKI persisted and Nephrology was consulted to assist with management. Was thought to be secondary to ATN due to hypovolemia and she received IVF throughout admission. Creatinine improved to 1.24 on 04/19/2019 and she was discharged to SNF. Of note, she was also treated for candidal vulvovaginitis and a UTI during admission. Weight was 168 lbs at discharge.   In talking with the patient (who was at [facility]) along with her daughter (called as well to update), she reports having persistent abdominal distention and lower extremity edema since hospital discharge. She has been weighed daily while at rehab and weight was at 164 lbs on 04/19/2019 and has improved to 159.7 lbs today. Reports a baseline in the 130's (was 137 lbs in 03/2019).  She reports associated dyspnea on exertion and orthopnea. Denies any chest pain or palpitations.  She has been participating in physical therapy on a daily basis and denies any pain or palpitations with this but does feel more short of breath as compared to her prior baseline.  The patient does not have symptoms concerning for COVID-19 infection (fever, chills, cough, or new shortness of breath).    Past Medical History:  Diagnosis Date  . Atrial fibrillation ([place])   . Chronic back  pain   . Diarrhea   . Dysphagia 05/03/2016  . GERD (gastroesophageal reflux disease)    Past Surgical History:  Procedure Laterality Date  . ABDOMINAL HYSTERECTOMY    . BACK SURGERY    . CHOLECYSTECTOMY N/A 01/29/2014   Procedure: LAPAROSCOPIC CHOLECYSTECTOMY;  Surgeon: Scherry Ran, MD;  Location: [facility] ORS;  Service: General;  Laterality: N/A;  . COLONOSCOPY N/A 10/27/2015   Procedure: COLONOSCOPY;  Surgeon: Rogene Houston, MD;  Location: [facility] ENDO SUITE;  Service: Endoscopy;  Laterality: N/A;  215  . INTRAMEDULLARY (IM) NAIL INTERTROCHANTERIC Right 11/29/2018   Procedure: INTRAMEDULLARY (IM) NAIL INTERTROCHANTRIC FRACTURE;  Surgeon: Renette Butters, MD;  Location: [place];  Service: Orthopedics;  Laterality: Right;  . TONSILLECTOMY    . YAG LASER APPLICATION Left [id number]   Procedure: YAG LASER APPLICATION;  Surgeon: Elta Guadeloupe T. Gershon Crane, MD;  Location: [facility] ORS;  Service: Ophthalmology;  Laterality: Left;  left     Current Meds  Medication Sig  . acetaminophen (TYLENOL) 325 MG tablet Take 650 mg by mouth every 6 (six) hours as needed.  Marland Kitchen alendronate (FOSAMAX) 70 MG tablet Take 1 tablet (70 mg total) by mouth once a week. Take with a full glass of water on an empty stomach.  Marland Kitchen apixaban (ELIQUIS) 5 MG TABS tablet Take 1 tablet (5 mg total) by mouth 2 (two) times daily.  Roseanne Kaufman Peru-Castor Oil (VENELEX) OINT Apply topically to sacrum and bilateral buttocks every shift and as needed for prevention  . calcium-vitamin D (OS-CAL 500 + D) 500-200 MG-UNIT tablet Take 1 tablet by mouth 2 (two) times daily.  . cephALEXin (KEFLEX) 500 MG capsule Take 1 capsule (500 mg total) by mouth 3 (three) times daily for 5 days. For UTI  . diltiazem (CARDIZEM CD) 360 MG 24 hr capsule Take 1 capsule (360 mg total) by mouth daily.  . feeding supplement, ENSURE ENLIVE, (ENSURE ENLIVE) LIQD Take 237 mLs by mouth 2 (two) times daily between meals.  . magnesium oxide (MAG-OX) 400 MG tablet Take 1 tablet (400 mg total) by mouth 3 (three) times daily.  . memantine (NAMENDA) 5 MG tablet Take 1 tablet (5 mg total) by mouth 2 (two) times daily.  . metoprolol tartrate (LOPRESSOR) 25 MG tablet Take 1 tablet (25 mg total) by mouth 2 (two) times daily.  . mirtazapine (REMERON) 7.5 MG tablet Take 1 tablet  (7.5 mg total) by mouth at bedtime.  . Multiple Vitamin (MULTIVITAMIN WITH MINERALS) TABS tablet Take 1 tablet by mouth daily.  . NON FORMULARY Diet Type: NAS  . ondansetron (ZOFRAN) 4 MG tablet Take 1 tablet (4 mg total) by mouth every 8 (eight) hours as needed for nausea or vomiting.  Oneta Rack Supplies (SKIN PREP WIPES) MISC Apply to bilateral heels every shift for prevention  . pantoprazole (PROTONIX) 40 MG tablet Take 1 tablet (40 mg total) by mouth daily.  Vladimir Faster Glycol-Propyl Glycol (SYSTANE OP) Place 1 drop into both eyes daily as needed. Dry Eyes   . polyethylene glycol (MIRALAX / GLYCOLAX) 17 g packet Take 17 g by mouth daily as needed for mild constipation.  . QUEtiapine (SEROQUEL) 25 MG tablet Take 1 tablet (25 mg total) by mouth at bedtime.  . sertraline (ZOLOFT) 50 MG tablet Take 1 tablet (50 mg total) by mouth every morning.     Allergies:   Penicillins, Emetrol, Feldene [piroxicam], and Sulfa antibiotics   Social History   Tobacco Use  . Smoking status: Never Smoker  .  Smokeless tobacco: Never Used  Substance Use Topics  . Alcohol use: No  . Drug use: No     Family Hx: The patient's family history includes Hypertension in her child.  ROS:   Please see the history of present illness.     All other systems reviewed and are negative.   Prior CV studies:   The following studies were reviewed today:  Echocardiogram: 03/2019 IMPRESSIONS    1. Left ventricular ejection fraction, by visual estimation, is 70 to 75%. The left ventricle has hyperdynamic function. Decreased left ventricular size. There is moderately increased left ventricular hypertrophy.  2. Left ventricular diastolic Doppler parameters are indeterminate pattern of LV diastolic filling.  3. Right ventricular volume and pressure overload.  4. Global right ventricle has normal systolic function.The right ventricular size is moderately enlarged. Mildly increased right ventricular wall thickness.   5. Left atrial size was severely dilated.  6. Right atrial size was normal.  7. Presence of pericardial fat pad.  8. Moderate aortic valve annular calcification.  9. Moderate to severe mitral annular calcification. 10. Severe calcification of the mitral valve leaflet(s). Valve appears stenotic, not completely evaluated. Suggest follow-up images with VTI measurements or planimetry if possible. 11. The mitral valve is degenerative. Mild mitral valve regurgitation. 12. The tricuspid valve is grossly normal. Tricuspid valve regurgitation is mild. 13. Aortic valve area, by VTI measures 1.83 cm. 14. Aortic valve mean gradient measures 8.8 mmHg. 15. The aortic valve is tricuspid Aortic valve regurgitation was not visualized by color flow Doppler. Mild aortic valve stenosis. 16. Moderately elevated pulmonary artery systolic pressure. 17. The tricuspid regurgitant velocity is 3.67 m/s, and with an assumed right atrial pressure of 8 mmHg, the estimated right ventricular systolic pressure is moderately elevated at 61.9 mmHg. 18. The inferior vena cava is normal in size with <50% respiratory variability, suggesting right atrial pressure of 8 mmHg. 19. The pulmonic valve was grossly normal. Pulmonic valve regurgitation is trivial by color flow Doppler.  Labs/Other Tests and Data Reviewed:    EKG:  An ECG dated 04/10/2019 was personally reviewed today and demonstrated:  rate-controlled atrial fibrillation, HR 67 with LAFB.   Recent Labs: 04/04/2019: TSH 2.532 04/12/2019: Magnesium 2.1 04/13/2019: B Natriuretic Peptide 317.0 04/14/2019: ALT 69 04/17/2019: Hemoglobin 11.1; Platelets 423 04/19/2019: BUN 39; Creatinine, Ser 1.24; Potassium 4.9; Sodium 133   Recent Lipid Panel No results found for: CHOL, TRIG, HDL, CHOLHDL, LDLCALC, LDLDIRECT  Wt Readings from Last 3 Encounters:  04/22/19 159 lb (72.1 kg)  04/18/19 168 lb 10.4 oz (76.5 kg)  04/04/19 137 lb 3.2 oz (62.2 kg)     Objective:    Vital  Signs:  Wt 159 lb (72.1 kg)   BMI 27.29 kg/m    General: Pleasant female sounding in NAD Psych: Normal affect. Neuro: Alert and oriented X 3.  Lungs:  Resp regular and unlabored while talking on the phone.   ASSESSMENT & PLAN:    1. Persistent Atrial Fibrillation - Previously admitted with atrial fibrillation with RVR last month but she reports rates have overall been well controlled when checked at SNF. Unable to provide specific numbers. Remains on Cardizem CD 360 mg daily along with Lopressor 25 mg twice daily for rate control. - She denies any evidence of active bleeding. Remains on Eliquis 5 mg twice daily for anticoagulation. Will need to follow weight and renal function closely to ensure she remains on the appropriate dose.  2. Lower Extremity Edema - She received several liters  of IVF during her recent admission for an AKI. She reports a baseline weight in the 130's and this was elevated to 168 lbs at the time of hospital discharge. This has started to trend down and was at 159.7 lbs today but still 20 lbs above her baseline. Given this along with her worsening orthopnea, abdominal distention and lower extremity edema, will write for a short course of Lasix 40 mg for 3 days and reassess renal function at that time with repeat BMET. Pending results and clinical response, will consider additional diuretic therapy. Was not on Lasix prior to her recent admission.   3. Mitral Valve Disease - She has a history of mitral valve stenosis by prior echocardiograms but most recent imaging showed mild mitral regurgitation. Continue to follow.  4. AKI - Creatinine peaked at 2.06 during her most recent admission which was thought to be secondary to ATN.  Had improved to 1.24 at the time of discharge on 04/19/2019. Will recheck a BMET this week as outlined above.    COVID-19 Education: The signs and symptoms of COVID-19 were discussed with the patient and how to seek care for testing (follow up  with PCP or arrange E-visit).  The importance of social distancing was discussed today.  Time:   Today, I have spent 24 minutes with the patient with telehealth technology discussing the above problems.     Medication Adjustments/Labs and Tests Ordered: Current medicines are reviewed at length with the patient today.  Concerns regarding medicines are outlined above.   Tests Ordered: Orders Placed This Encounter  Procedures  . Basic Metabolic Panel (BMET)    Medication Changes: Lasix as above.  Follow Up: Virtual Visit in 2 weeks   Signed, TERRIANN ALBERGO, PA-C  04/22/2019 6:00 PM    [place]

## 2019-04-22 NOTE — Telephone Encounter (Signed)
Please give pt's daughter [name] a call--[phone]  Pt was previously admitted and has now been transferred to [place] facility for therapy, pt's daughter states the pt's weight has gone up to 160lbs and she normally weighs around 130lbs

## 2019-04-24 ENCOUNTER — Ambulatory Visit: Payer: PPO | Admitting: Student

## 2019-04-28 ENCOUNTER — Telehealth: Payer: Self-pay | Admitting: Cardiology

## 2019-04-28 NOTE — Telephone Encounter (Signed)
Brooke Bonito can be reached @ [phone] ext [phone]

## 2019-04-28 NOTE — Telephone Encounter (Signed)
Please give pt's daughter [name] a call [phone]

## 2019-04-28 NOTE — Telephone Encounter (Signed)
Please call Brooke Bonito the Clinical Nurse Manager @ [facility] @ [phone]

## 2019-04-28 NOTE — Telephone Encounter (Signed)
Returned pt's daughters call. She stated that she spoke with her mother this morning and her mother feels awful and is worried she is fluid overloaded. She states that she is going to go pick her mother up from [facility] and take her to [facility] for evaluation. Will forward to Dr. Domenic Polite as an Juluis Rainier.

## 2019-04-29 NOTE — Telephone Encounter (Signed)
Returned call to Marko Stai, nursing manager @ [facility]. I had to leave a detailed message informing her that pt's labs were reviewed and she can start lasix 20 mg daily, with a repeat BMET in one week.

## 2019-04-30 ENCOUNTER — Telehealth: Payer: Self-pay | Admitting: Student

## 2019-04-30 NOTE — Telephone Encounter (Signed)
Pt's daughter checked her out of [facility]- rehab today   Weight today is 166lb  States she's had a 5lb weight gain in 2 days  BNP is 3,095  Just started on the lasix  Would like to know if she should to take her to the ER or just keep the apt for tomorrow w/ B. Pree  Please call [place] @ [phone]

## 2019-04-30 NOTE — Telephone Encounter (Signed)
[name] notified that pt is to take 40 mg of Lasix today and keep appt for tomorrow

## 2019-04-30 NOTE — Telephone Encounter (Signed)
Pt to take 40 mg Lasix today and be seen in office tomorrow.

## 2019-05-01 ENCOUNTER — Encounter: Payer: Self-pay | Admitting: Student

## 2019-05-01 ENCOUNTER — Other Ambulatory Visit: Payer: Self-pay

## 2019-05-01 ENCOUNTER — Ambulatory Visit: Payer: PPO | Admitting: Student

## 2019-05-01 ENCOUNTER — Other Ambulatory Visit: Payer: Self-pay | Admitting: *Deleted

## 2019-05-01 VITALS — BP 111/62 | HR 64 | Temp 97.0°F | Ht 64.5 in | Wt 161.0 lb

## 2019-05-01 DIAGNOSIS — Z79899 Other long term (current) drug therapy: Secondary | ICD-10-CM | POA: Diagnosis not present

## 2019-05-01 DIAGNOSIS — I5031 Acute diastolic (congestive) heart failure: Secondary | ICD-10-CM | POA: Diagnosis not present

## 2019-05-01 DIAGNOSIS — N170 Acute kidney failure with tubular necrosis: Secondary | ICD-10-CM

## 2019-05-01 DIAGNOSIS — I4819 Other persistent atrial fibrillation: Secondary | ICD-10-CM

## 2019-05-01 DIAGNOSIS — R600 Localized edema: Secondary | ICD-10-CM | POA: Diagnosis not present

## 2019-05-01 MED ORDER — FUROSEMIDE 40 MG PO TABS: 40.0000 mg | ORAL_TABLET | Freq: Every day | ORAL | 3 refills | Status: DC

## 2019-05-01 NOTE — Patient Instructions (Addendum)
Limit daily fluid intake to less than 2 Liters per day. Please limit salt intake.  Please weight yourself every morning. Take an extra 20mg  of Lasix if weight increases by 3 pounds overnight or 5 pounds in a single week.   Medication Instructions:  INCREASE Lasix to 40 mg daily, TAKE AN EXTRA 20 MG TONIGHT ONLY  *If you need a refill on your cardiac medications before your next appointment, please call your pharmacy*  Lab Work:  BMET in 1 week  If you have labs (blood work) drawn today and your tests are completely normal, you will receive your results only by: Marland Kitchen MyChart Message (if you have MyChart) OR . A paper copy in the mail If you have any lab test that is abnormal or we need to change your treatment, we will call you to review the results.  Testing/Procedures: None today  Follow-Up: At [facility], you and your health needs are our priority.  As part of our continuing mission to provide you with exceptional heart care, we have created designated Provider Care Teams.  These Care Teams include your primary Cardiologist (physician) and Advanced Practice Providers (APPs -  Physician Assistants and Nurse Practitioners) who all work together to provide you with the care you need, when you need it.  Your next appointment:   3 weeks  The format for your next appointment:   In Person  Provider:   You may see Rozann Lesches, MD or one of the following Advanced Practice Providers on your designated Care Team:    Bernerd Pho, PA-C       Other Instructions None

## 2019-05-01 NOTE — Progress Notes (Signed)
Cardiology Office Note    Date:  05/01/2019   ID:  Kristy Mckinney, DOB [birth year], MRN [id number]  PCP:  [name], MD  Cardiologist: Rozann Lesches, MD    Chief Complaint  Patient presents with   Follow-up    2 week visit    History of Present Illness:    Kristy Mckinney is a 85 y.o. female with past medical history of permanent atrial fibrillation, mitral stenosis, and chronic back pain who presents for a 2-week follow-up visit.  She most recently had a telehealth visit with myself on 04/22/2019 as she had been admitted to [facility] on 04/10/2019 and was found to have hyperkalemia and an AKI.  Was bradycardic upon time of admission with AV nodal blocking agents being held but was resumed on Cardizem CD 360 mg daily and Lopressor 25 mg twice daily at the time of discharge. She was followed closely by Nephrology and her AKI was thought to be secondary to ATN and she received IVF during admission. Weight was 168 lbs at the time of discharge.  She was at [facility] at the time of her visit and weight had started to improve but was still elevated at 159 lbs (previously 137 lbs in 03/2019). She did report orthopnea along with abdominal distention and lower extremity edema, therefore a prescription was written for Lasix 40 mg daily for 3 days with repeat BMET at that time.  Renal function overall remained stable and she was continued on Lasix 20 mg daily as of 04/29/2019.  Her family called the office on 04/30/2019 reporting they had checked her out of rehab and weight was still elevated at 166 lbs, therefore she was started on Lasix 40mg  and informed to keep scheduled follow-up for today.   In talking with the patient and her daughter today, she has lost 5 pounds since yesterday after taking Lasix 40 mg. She reports having received Lasix at rehab last week but is unsure if this was administered earlier in the week. She has now returned home and her daughter is staying with  her 24/7. She reports still having significant dyspnea on exertion along with orthopnea. Has also experienced worsening lower extremity edema. She denies any recent chest pain or palpitations.  They have been trying to monitor her sodium intake but she does not consume fast food or frozen foods regularly. She is unsure of what was added to the food while at rehab but she did not add additional salt to her entres.   Past Medical History:  Diagnosis Date   Atrial fibrillation ([place])    Chronic back pain    Diarrhea    Dysphagia 05/03/2016   GERD (gastroesophageal reflux disease)     Past Surgical History:  Procedure Laterality Date   ABDOMINAL HYSTERECTOMY     BACK SURGERY     CHOLECYSTECTOMY N/A 01/29/2014   Procedure: LAPAROSCOPIC CHOLECYSTECTOMY;  Surgeon: Scherry Ran, MD;  Location: [facility];  Service: General;  Laterality: N/A;   COLONOSCOPY N/A 10/27/2015   Procedure: COLONOSCOPY;  Surgeon: Rogene Houston, MD;  Location: [facility];  Service: Endoscopy;  Laterality: N/A;  215   INTRAMEDULLARY (IM) NAIL INTERTROCHANTERIC Right 11/29/2018   Procedure: INTRAMEDULLARY (IM) NAIL INTERTROCHANTRIC FRACTURE;  Surgeon: Renette Butters, MD;  Location: [facility];  Service: Orthopedics;  Laterality: Right;   TONSILLECTOMY     YAG LASER APPLICATION Left [id number]   Procedure: YAG LASER APPLICATION;  Surgeon: Elta Guadeloupe T. Gershon Crane, MD;  Location:  [facility];  Service: Ophthalmology;  Laterality: Left;  left    Current Medications: Outpatient Medications Prior to Visit  Medication Sig Dispense Refill   acetaminophen (TYLENOL) 325 MG tablet Take 650 mg by mouth every 6 (six) hours as needed.     alendronate (FOSAMAX) 70 MG tablet Take 1 tablet (70 mg total) by mouth once a week. Take with a full glass of water on an empty stomach. 4 tablet 0   apixaban (ELIQUIS) 5 MG TABS tablet Take 1 tablet (5 mg total) by mouth 2 (two) times daily. 60 tablet 5   Balsam [place]-Castor Oil (VENELEX)  OINT Apply topically to sacrum and bilateral buttocks every shift and as needed for prevention     calcium-vitamin D (OS-CAL 500 + D) 500-200 MG-UNIT tablet Take 1 tablet by mouth 2 (two) times daily.     diltiazem (CARDIZEM CD) 360 MG 24 hr capsule Take 1 capsule (360 mg total) by mouth daily. 30 capsule 5   feeding supplement, ENSURE ENLIVE, (ENSURE ENLIVE) LIQD Take 237 mLs by mouth 2 (two) times daily between meals. 237 mL 12   magnesium oxide (MAG-OX) 400 MG tablet Take 1 tablet (400 mg total) by mouth 3 (three) times daily. 90 tablet 0   memantine (NAMENDA) 5 MG tablet Take 1 tablet (5 mg total) by mouth 2 (two) times daily. 60 tablet 0   metoprolol tartrate (LOPRESSOR) 25 MG tablet Take 1 tablet (25 mg total) by mouth 2 (two) times daily. 60 tablet 5   mirtazapine (REMERON) 7.5 MG tablet Take 1 tablet (7.5 mg total) by mouth at bedtime. 30 tablet 3   Multiple Vitamin (MULTIVITAMIN WITH MINERALS) TABS tablet Take 1 tablet by mouth daily.     NON FORMULARY Diet Type: NAS     ondansetron (ZOFRAN) 4 MG tablet Take 1 tablet (4 mg total) by mouth every 8 (eight) hours as needed for nausea or vomiting. 20 tablet 0   Ostomy Supplies (SKIN PREP WIPES) MISC Apply to bilateral heels every shift for prevention     pantoprazole (PROTONIX) 40 MG tablet Take 1 tablet (40 mg total) by mouth daily. 30 tablet 3   Polyethyl Glycol-Propyl Glycol (SYSTANE OP) Place 1 drop into both eyes daily as needed. Dry Eyes      polyethylene glycol (MIRALAX / GLYCOLAX) 17 g packet Take 17 g by mouth daily as needed for mild constipation. 14 each 0   QUEtiapine (SEROQUEL) 25 MG tablet Take 1 tablet (25 mg total) by mouth at bedtime. 30 tablet 0   sertraline (ZOLOFT) 50 MG tablet Take 1 tablet (50 mg total) by mouth every morning. 30 tablet 0   No facility-administered medications prior to visit.      Allergies:   Penicillins, Emetrol, Feldene [piroxicam], and Sulfa antibiotics   Social History    Socioeconomic History   Marital status: Widowed    Spouse name: Not on file   Number of children: Not on file   Years of education: Not on file   Highest education level: Not on file  Occupational History   Not on file  Social Needs   Financial resource strain: Not hard at all   Food insecurity    Worry: Never true    Inability: Never true   Transportation needs    Medical: No    Non-medical: No  Tobacco Use   Smoking status: Never Smoker   Smokeless tobacco: Never Used  Substance and Sexual Activity   Alcohol use: No  Drug use: No   Sexual activity: Yes    Birth control/protection: Surgical  Lifestyle   Physical activity    Days per week: 5 days    Minutes per session: 30 min   Stress: Not at all  Relationships   Social connections    Talks on phone: More than three times a week    Gets together: More than three times a week    Attends religious service: More than 4 times per year    Active member of club or organization: Yes    Attends meetings of clubs or organizations: More than 4 times per year    Relationship status: Widowed  Other Topics Concern   Not on file  Social History Narrative   Pt lives in an apartment complex, reports that she still drives     Family History:  The patient's family history includes Hypertension in her child.   Review of Systems:   Please see the history of present illness.     General:  No chills, fever, night sweats or weight changes.  Cardiovascular:  No chest pain, palpitations, paroxysmal nocturnal dyspnea. Positive for dyspnea on exertion, orthopnea and edema.  Dermatological: No rash, lesions/masses Respiratory: Positive for dry cough and dyspnea. Urologic: No hematuria, dysuria Abdominal:   No nausea, vomiting, diarrhea, bright red blood per rectum, melena, or hematemesis Neurologic:  No visual changes, wkns, changes in mental status. All other systems reviewed and are otherwise negative except as  noted above.   Physical Exam:    VS:  BP 111/62    Pulse 64    Temp (!) 97 F (36.1 C)    Ht 5' 4.5" (1.638 m)    Wt 161 lb (73 kg)    SpO2 95%    BMI 27.21 kg/m    General: Well developed, well nourished,female appearing in no acute distress. Head: Normocephalic, atraumatic, sclera non-icteric, no xanthomas, nares are without discharge.  Neck: No carotid bruits. JVD not elevated.  Lungs: Respirations regular and unlabored, mild rales along right base. Heart: Irregularly irregular. No S3 or S4.  No murmur, no rubs, or gallops appreciated. Abdomen: Soft, non-tender, non-distended with normoactive bowel sounds. No hepatomegaly. No rebound/guarding. No obvious abdominal masses. Msk:  Strength and tone appear normal for age. No joint deformities or effusions. Extremities: No clubbing or cyanosis. 2+ pitting edema up to knees bilaterally.  Distal pedal pulses are 2+ bilaterally. Neuro: Alert and oriented X 3. Moves all extremities spontaneously. No focal deficits noted. Psych:  Responds to questions appropriately with a normal affect. Skin: No rashes or lesions noted  Wt Readings from Last 3 Encounters:  05/01/19 161 lb (73 kg)  04/22/19 159 lb (72.1 kg)  04/18/19 168 lb 10.4 oz (76.5 kg)     Studies/Labs Reviewed:   EKG:  EKG is not ordered today.   Recent Labs: 04/04/2019: TSH 2.532 04/12/2019: Magnesium 2.1 04/13/2019: B Natriuretic Peptide 317.0 04/14/2019: ALT 69 04/17/2019: Hemoglobin 11.1; Platelets 423 04/19/2019: BUN 39; Creatinine, Ser 1.24; Potassium 4.9; Sodium 133   Lipid Panel No results found for: CHOL, TRIG, HDL, CHOLHDL, VLDL, LDLCALC, LDLDIRECT  Additional studies/ records that were reviewed today include:   Echocardiogram: 04/04/2019 IMPRESSIONS   1. Left ventricular ejection fraction, by visual estimation, is 70 to 75%. The left ventricle has hyperdynamic function. Decreased left ventricular size. There is moderately increased left ventricular hypertrophy.  2.  Left ventricular diastolic Doppler parameters are indeterminate pattern of LV diastolic filling.  3. Right ventricular volume  and pressure overload.  4. Global right ventricle has normal systolic function.The right ventricular size is moderately enlarged. Mildly increased right ventricular wall thickness.  5. Left atrial size was severely dilated.  6. Right atrial size was normal.  7. Presence of pericardial fat pad.  8. Moderate aortic valve annular calcification.  9. Moderate to severe mitral annular calcification. 10. Severe calcification of the mitral valve leaflet(s). Valve appears stenotic, not completely evaluated. Suggest follow-up images with VTI measurements or planimetry if possible. 11. The mitral valve is degenerative. Mild mitral valve regurgitation. 12. The tricuspid valve is grossly normal. Tricuspid valve regurgitation is mild. 13. Aortic valve area, by VTI measures 1.83 cm. 14. Aortic valve mean gradient measures 8.8 mmHg. 15. The aortic valve is tricuspid Aortic valve regurgitation was not visualized by color flow Doppler. Mild aortic valve stenosis. 16. Moderately elevated pulmonary artery systolic pressure. 17. The tricuspid regurgitant velocity is 3.67 m/s, and with an assumed right atrial pressure of 8 mmHg, the estimated right ventricular systolic pressure is moderately elevated at 61.9 mmHg. 18. The inferior vena cava is normal in size with <50% respiratory variability, suggesting right atrial pressure of 8 mmHg. 19. The pulmonic valve was grossly normal. Pulmonic valve regurgitation is trivial by color flow Doppler.  Assessment:    1. Acute diastolic (congestive) heart failure ([place])   2. Bilateral lower extremity edema   3. Persistent atrial fibrillation ([place])   4. Acute renal failure with tubular necrosis (HCC)   5. Medication management      Plan:   In order of problems listed above:  1. Acute Diastolic CHF/ Lower Extremity Edema - She started to have  issues with fluid retention following a recent admission for AKI during which she received a significant amount of IVF and weight increased by 30 lbs during admission. She has been in SNF but returned home yesterday and it is unclear if she was receiving diuretic medications while there. - Weight remains elevated at 161 lbs on the office scales (159 lbs on her home scales today) with an estimated baseline in the 130's as weight was at 137 lbs in 03/2019. She is significantly volume overloaded by examination today. Given that weight declined over 5 pounds with Lasix 40 mg yesterday, will have her start Lasix 40 mg daily (took this intermittently while at SNF and creatinine remained 0.9-1.0). Recheck BMET next week. I informed her to make [place] aware of any weight gain in the interim. Reviewed with the patient and her daughter today that she is high risk for readmission but wishes to avoid hospitalization given her frequent admissions over the past 2 months.  2. Permanent Atrial Fibrillation - HR has been well-controlled when checked at home and during today's visit. Continue Cardizem CD 360mg  daily and Lopressor 25mg  BID.  - remains on Eliquis 5mg  BID (dosing increased during recent admission given elevated creatinine and weight gain). Only indication for reduced dosing at this time is age.   3. AKI - Creatinine peaked at 2.06 during recent admission, improved to 1.24 at the time of hospital discharge.  Creatinine was 0.9 when checked at SNF last week. Will recheck a BMET in one week for reassessment following adjustment of diuretic therapy.    Medication Adjustments/Labs and Tests Ordered: Current medicines are reviewed at length with the patient today.  Concerns regarding medicines are outlined above.  Medication changes, Labs and Tests ordered today are listed in the Patient Instructions below. Patient Instructions  Limit daily fluid intake to  less than 2 Liters per day. Please limit salt intake.   Please weight yourself every morning. Take an extra 20mg  of Lasix if weight increases by 3 pounds overnight or 5 pounds in a single week.   Medication Instructions:  INCREASE Lasix to 40 mg daily, TAKE AN EXTRA 20 MG TONIGHT ONLY  *If you need a refill on your cardiac medications before your next appointment, please call your pharmacy*  Lab Work:  BMET in 1 week  If you have labs (blood work) drawn today and your tests are completely normal, you will receive your results only by:  [place] (if you have MyChart) OR  A paper copy in the mail If you have any lab test that is abnormal or we need to change your treatment, we will call you to review the results.  Testing/Procedures: None today  Follow-Up: At [facility], you and your health needs are our priority.  As part of our continuing mission to provide you with exceptional heart care, we have created designated Provider Care Teams.  These Care Teams include your primary Cardiologist (physician) and Advanced Practice Providers (APPs -  Physician Assistants and Nurse Practitioners) who all work together to provide you with the care you need, when you need it.  Your next appointment:   3 weeks  The format for your next appointment:   In Person  Provider:   You may see Rozann Lesches, MD or one of the following Advanced Practice Providers on your designated Care Team:    Bernerd Pho, PA-C    Other Instructions None    Signed, LURA SHUSTERMAN, Hershal Coria  05/01/2019 5:35 PM    [place]. [address] [place], [place] [address] Phone: [phone] Fax: [phone]

## 2019-05-01 NOTE — Patient Outreach (Signed)
Telephone calls and chart review to determine pt whereabouts. Unsure if she is at [facility] or home at this time.  Called pt home, 2 daughters and a granddaughter, leaving messaged to return my call to confirm her location and particpate in her discharge plan.  Kristy Mckinney. Myrtie Neither, MSN, [facility] Gerontological Nurse Practitioner [facility] Care Management [phone]

## 2019-05-01 NOTE — Patient Outreach (Signed)
Received return call from pt's daughter, Mattie Marlin. I introduced myself and explained my role with [facility] and [facility]. I explained that we are concerned that she has all the resources she needs and that she is safe.  Mrs. Hanks shares that her mother will most likely not have 24 hour care. Advised her that is recommended since she has had 2 recent hospitalizations and 5 hospitalizations this year.  She states she will give her sister, [name], the message and have her call me back.  Expressed my appreciation for her time.  Eulah Pont. Myrtie Neither, MSN, [facility] Gerontological Nurse Practitioner [facility] Care Management [phone]

## 2019-05-01 NOTE — Patient Outreach (Signed)
Kristy Mckinney, Pharm D, [facility] and grand daughter to Kristy Mckinney called me back and verified that we are co-workers. She is glad to have a teammate on her mothers health care team.  We discussed that her mother is not safe at home alone . We discussed placment and the family does want to persue this. Kristy Mckinney says they would like to see if they could get her into [facility]. She has another grandmother there and they were very satisfied.  We discussed her mothers severe cardiovascular disease, possible options, possible, PC.  She definitely agrees arrangements need to made for her placement. She will request an FL2 form from Dr. Luan Pulling office and they will need to talk to [place] and let them know their intensions and apply for LTC Medicaid and the Social Services dept.  Eulah Pont. Myrtie Neither, MSN, [facility] Gerontological Nurse Practitioner [facility] Care Management [phone]

## 2019-05-02 ENCOUNTER — Inpatient Hospital Stay (HOSPITAL_COMMUNITY)
Admission: EM | Admit: 2019-05-02 | Discharge: 2019-05-10 | DRG: 292 | Disposition: A | Discharge status: Home Health | Payer: PPO | Attending: Pulmonary Disease | Admitting: Pulmonary Disease

## 2019-05-02 ENCOUNTER — Other Ambulatory Visit: Payer: Self-pay

## 2019-05-02 ENCOUNTER — Encounter (HOSPITAL_COMMUNITY): Payer: Self-pay

## 2019-05-02 ENCOUNTER — Emergency Department (HOSPITAL_COMMUNITY): Payer: PPO

## 2019-05-02 DIAGNOSIS — F329 Major depressive disorder, single episode, unspecified: Secondary | ICD-10-CM | POA: Diagnosis present

## 2019-05-02 DIAGNOSIS — Z515 Encounter for palliative care: Secondary | ICD-10-CM | POA: Diagnosis not present

## 2019-05-02 DIAGNOSIS — I4821 Permanent atrial fibrillation: Secondary | ICD-10-CM | POA: Diagnosis not present

## 2019-05-02 DIAGNOSIS — Z882 Allergy status to sulfonamides status: Secondary | ICD-10-CM | POA: Diagnosis not present

## 2019-05-02 DIAGNOSIS — F0391 Unspecified dementia with behavioral disturbance: Secondary | ICD-10-CM | POA: Diagnosis not present

## 2019-05-02 DIAGNOSIS — I5033 Acute on chronic diastolic (congestive) heart failure: Secondary | ICD-10-CM | POA: Diagnosis not present

## 2019-05-02 DIAGNOSIS — K219 Gastro-esophageal reflux disease without esophagitis: Secondary | ICD-10-CM | POA: Diagnosis present

## 2019-05-02 DIAGNOSIS — Z880 Allergy status to penicillin: Secondary | ICD-10-CM

## 2019-05-02 DIAGNOSIS — I4891 Unspecified atrial fibrillation: Secondary | ICD-10-CM | POA: Diagnosis present

## 2019-05-02 DIAGNOSIS — R0602 Shortness of breath: Secondary | ICD-10-CM

## 2019-05-02 DIAGNOSIS — F419 Anxiety disorder, unspecified: Secondary | ICD-10-CM | POA: Diagnosis present

## 2019-05-02 DIAGNOSIS — I110 Hypertensive heart disease with heart failure: Secondary | ICD-10-CM | POA: Diagnosis not present

## 2019-05-02 DIAGNOSIS — I482 Chronic atrial fibrillation, unspecified: Secondary | ICD-10-CM | POA: Diagnosis not present

## 2019-05-02 DIAGNOSIS — R0902 Hypoxemia: Secondary | ICD-10-CM | POA: Diagnosis present

## 2019-05-02 DIAGNOSIS — F0390 Unspecified dementia without behavioral disturbance: Secondary | ICD-10-CM | POA: Diagnosis present

## 2019-05-02 DIAGNOSIS — Z20828 Contact with and (suspected) exposure to other viral communicable diseases: Secondary | ICD-10-CM | POA: Diagnosis present

## 2019-05-02 DIAGNOSIS — Z7189 Other specified counseling: Secondary | ICD-10-CM

## 2019-05-02 DIAGNOSIS — I509 Heart failure, unspecified: Secondary | ICD-10-CM | POA: Diagnosis not present

## 2019-05-02 DIAGNOSIS — Z79899 Other long term (current) drug therapy: Secondary | ICD-10-CM | POA: Diagnosis not present

## 2019-05-02 DIAGNOSIS — I5031 Acute diastolic (congestive) heart failure: Secondary | ICD-10-CM | POA: Diagnosis not present

## 2019-05-02 DIAGNOSIS — Z7983 Long term (current) use of bisphosphonates: Secondary | ICD-10-CM

## 2019-05-02 DIAGNOSIS — I350 Nonrheumatic aortic (valve) stenosis: Secondary | ICD-10-CM | POA: Diagnosis not present

## 2019-05-02 LAB — CBC WITH DIFFERENTIAL/PLATELET
Abs Immature Granulocytes: 0.02 10*3/uL (ref 0.00–0.07)
Basophils Absolute: 0.1 10*3/uL (ref 0.0–0.1)
Basophils Relative: 1 %
Eosinophils Absolute: 0.1 10*3/uL (ref 0.0–0.5)
Eosinophils Relative: 1 %
HCT: 37.6 % (ref 36.0–46.0)
Hemoglobin: 11.1 g/dL — ABNORMAL LOW (ref 12.0–15.0)
Immature Granulocytes: 0 %
Lymphocytes Relative: 21 %
Lymphs Abs: 1.3 10*3/uL (ref 0.7–4.0)
MCH: 25.6 pg — ABNORMAL LOW (ref 26.0–34.0)
MCHC: 29.5 g/dL — ABNORMAL LOW (ref 30.0–36.0)
MCV: 86.6 fL (ref 80.0–100.0)
Monocytes Absolute: 0.7 10*3/uL (ref 0.1–1.0)
Monocytes Relative: 11 %
Neutro Abs: 4.2 10*3/uL (ref 1.7–7.7)
Neutrophils Relative %: 66 %
Platelets: 231 10*3/uL (ref 150–400)
RBC: 4.34 MIL/uL (ref 3.87–5.11)
RDW: 16 % — ABNORMAL HIGH (ref 11.5–15.5)
WBC: 6.3 10*3/uL (ref 4.0–10.5)
nRBC: 0 % (ref 0.0–0.2)

## 2019-05-02 LAB — BRAIN NATRIURETIC PEPTIDE: B Natriuretic Peptide: 473 pg/mL — ABNORMAL HIGH (ref 0.0–100.0)

## 2019-05-02 LAB — BASIC METABOLIC PANEL
Anion gap: 11 (ref 5–15)
BUN: 29 mg/dL — ABNORMAL HIGH (ref 8–23)
CO2: 26 mmol/L (ref 22–32)
Calcium: 9.2 mg/dL (ref 8.9–10.3)
Chloride: 100 mmol/L (ref 98–111)
Creatinine, Ser: 1.05 mg/dL — ABNORMAL HIGH (ref 0.44–1.00)
GFR calc Af Amer: 56 mL/min — ABNORMAL LOW (ref >60)
GFR calc non Af Amer: 49 mL/min — ABNORMAL LOW (ref >60)
Glucose, Bld: 114 mg/dL — ABNORMAL HIGH (ref 70–99)
Potassium: 3.5 mmol/L (ref 3.5–5.1)
Sodium: 137 mmol/L (ref 135–145)

## 2019-05-02 LAB — SARS CORONAVIRUS 2 (TAT 6-24 HRS): SARS Coronavirus 2: NEGATIVE

## 2019-05-02 LAB — TROPONIN I (HIGH SENSITIVITY)
Troponin I (High Sensitivity): 8 ng/L (ref <18)
Troponin I (High Sensitivity): 8 ng/L (ref <18)

## 2019-05-02 LAB — NOVEL CORONAVIRUS, NAA: SARS-CoV-2, NAA: NEGATIVE

## 2019-05-02 MED ORDER — DILTIAZEM HCL ER COATED BEADS 180 MG PO CP24
360.0000 mg | ORAL_CAPSULE | Freq: Every day | ORAL | Status: DC
  Administered 2019-05-02 – 2019-05-10 (×9): 360 mg via ORAL
  Filled 2019-05-02 (×2): qty 2
  Filled 2019-05-02 (×2): qty 1
  Filled 2019-05-02 (×5): qty 2
  Filled 2019-05-02: qty 1
  Filled 2019-05-02 (×3): qty 2
  Filled 2019-05-02: qty 1

## 2019-05-02 MED ORDER — ENSURE ENLIVE PO LIQD
237.0000 mL | Freq: Two times a day (BID) | ORAL | Status: DC
  Administered 2019-05-03 – 2019-05-07 (×8): 237 mL via ORAL

## 2019-05-02 MED ORDER — METOPROLOL TARTRATE 25 MG PO TABS
25.0000 mg | ORAL_TABLET | Freq: Two times a day (BID) | ORAL | Status: DC
  Administered 2019-05-02 – 2019-05-10 (×16): 25 mg via ORAL
  Filled 2019-05-02 (×17): qty 1

## 2019-05-02 MED ORDER — SODIUM CHLORIDE 0.9% FLUSH
3.0000 mL | Freq: Two times a day (BID) | INTRAVENOUS | Status: DC
  Administered 2019-05-02 – 2019-05-09 (×15): 3 mL via INTRAVENOUS

## 2019-05-02 MED ORDER — FUROSEMIDE 10 MG/ML IJ SOLN
40.0000 mg | Freq: Two times a day (BID) | INTRAMUSCULAR | Status: DC
  Administered 2019-05-02 – 2019-05-05 (×6): 40 mg via INTRAVENOUS
  Filled 2019-05-02 (×7): qty 4

## 2019-05-02 MED ORDER — SERTRALINE HCL 50 MG PO TABS
50.0000 mg | ORAL_TABLET | Freq: Every morning | ORAL | Status: DC
  Administered 2019-05-02 – 2019-05-10 (×9): 50 mg via ORAL
  Filled 2019-05-02 (×9): qty 1

## 2019-05-02 MED ORDER — PANTOPRAZOLE SODIUM 40 MG PO TBEC
40.0000 mg | DELAYED_RELEASE_TABLET | Freq: Every day | ORAL | Status: DC
  Administered 2019-05-02 – 2019-05-10 (×9): 40 mg via ORAL
  Filled 2019-05-02 (×9): qty 1

## 2019-05-02 MED ORDER — SODIUM CHLORIDE 0.9 % IV SOLN: 250.0000 mL | INTRAVENOUS | Status: DC | PRN

## 2019-05-02 MED ORDER — MEMANTINE HCL 10 MG PO TABS
5.0000 mg | ORAL_TABLET | Freq: Two times a day (BID) | ORAL | Status: DC
  Administered 2019-05-02 – 2019-05-10 (×16): 5 mg via ORAL
  Filled 2019-05-02 (×25): qty 1

## 2019-05-02 MED ORDER — QUETIAPINE FUMARATE 25 MG PO TABS
25.0000 mg | ORAL_TABLET | Freq: Every day | ORAL | Status: DC
  Administered 2019-05-02 – 2019-05-09 (×8): 25 mg via ORAL
  Filled 2019-05-02 (×8): qty 1

## 2019-05-02 MED ORDER — CALCIUM CARBONATE-VITAMIN D 500-200 MG-UNIT PO TABS
1.0000 | ORAL_TABLET | Freq: Two times a day (BID) | ORAL | Status: DC
  Administered 2019-05-02 – 2019-05-10 (×16): 1 via ORAL
  Filled 2019-05-02 (×25): qty 1

## 2019-05-02 MED ORDER — FUROSEMIDE 10 MG/ML IJ SOLN
40.0000 mg | Freq: Once | INTRAMUSCULAR | Status: AC
  Administered 2019-05-02: 40 mg via INTRAVENOUS
  Filled 2019-05-02: qty 4

## 2019-05-02 MED ORDER — MIRTAZAPINE 15 MG PO TABS
7.5000 mg | ORAL_TABLET | Freq: Every day | ORAL | Status: DC
  Administered 2019-05-02 – 2019-05-09 (×8): 7.5 mg via ORAL
  Filled 2019-05-02 (×8): qty 1

## 2019-05-02 MED ORDER — APIXABAN 5 MG PO TABS
5.0000 mg | ORAL_TABLET | Freq: Two times a day (BID) | ORAL | Status: DC
  Administered 2019-05-02 – 2019-05-10 (×17): 5 mg via ORAL
  Filled 2019-05-02 (×18): qty 1

## 2019-05-02 MED ORDER — ACETAMINOPHEN 325 MG PO TABS
650.0000 mg | ORAL_TABLET | ORAL | Status: DC | PRN
  Administered 2019-05-03 – 2019-05-08 (×10): 650 mg via ORAL
  Filled 2019-05-02 (×10): qty 2

## 2019-05-02 MED ORDER — ONDANSETRON HCL 4 MG/2ML IJ SOLN: 4.0000 mg | Freq: Four times a day (QID) | INTRAMUSCULAR | Status: DC | PRN

## 2019-05-02 MED ORDER — ADULT MULTIVITAMIN W/MINERALS CH
1.0000 | ORAL_TABLET | Freq: Every day | ORAL | Status: DC
  Administered 2019-05-02 – 2019-05-10 (×9): 1 via ORAL
  Filled 2019-05-02 (×9): qty 1

## 2019-05-02 MED ORDER — SODIUM CHLORIDE 0.9% FLUSH: 3.0000 mL | INTRAVENOUS | Status: DC | PRN

## 2019-05-02 NOTE — TOC Initial Note (Addendum)
Transition of Care [facility]) - Initial/Assessment Note    Patient Details  Name: Kristy Mckinney MRN: [id number] Date of Birth: 03-Nov-1931  Transition of Care [facility]) CM/SW Contact:    Shade Flood, LCSW Phone Number: 05/02/2019, 2:22 PM  Clinical Narrative:                  Pt admitted from home. She was discharged from [facility] for rehab on 04/19/19. She has since gone home from [facility]. Pt is high risk for readmission.  Pt lives at home alone, but is checked on daily, in person or on the phone, by her local daughters and son. Pt has a cane, walker, shower chair and grab rails in her handicap accessible bathroom. Dr Luan Pulling is her PCP and she states all her medications are affordable.   Pt had a Palliative Care consult during her last admission. Asked MD to re-consult in light of pt's readmission.  TOC will continue to follow and assist with dc planning needs. Anticipate pt returning home with [facility] unless she is determined to need SNF rehab again.  1435: Received message from [place] stating family thinking pt will need to be placed at dc. MD states pt will be here through the weekend. Will follow up Monday to further assess once Palliative Consult is complete and primary MD has weighed in.  Expected Discharge Plan: [place] Barriers to Discharge: Continued Medical Work up   Patient Goals and CMS Choice        Expected Discharge Plan and Services Expected Discharge Plan: [place] In-house Referral: Clinical Social Work     Living arrangements for the past 2 months: Apartment                                      Prior Living Arrangements/Services Living arrangements for the past 2 months: Apartment Lives with:: Self Patient language and need for interpreter reviewed:: Yes Do you feel safe going back to the place where you live?: Yes      Need for Family Participation in Patient Care: Yes (Comment) Care giver support  system in place?: Yes (comment) Current home services: DME Criminal Activity/Legal Involvement Pertinent to Current Situation/Hospitalization: No - Comment as needed  Activities of Daily Living      Permission Sought/Granted                  Emotional Assessment Appearance:: Appears stated age     Orientation: : Oriented to Self, Oriented to Place, Oriented to  Time, Oriented to Situation Alcohol / Substance Use: Not Applicable Psych Involvement: No (comment)  Admission diagnosis:  Shortness Of Breath Patient Active Problem List   Diagnosis Date Noted  . Acute on chronic diastolic congestive heart failure ([place]) 05/02/2019  . Acute on chronic diastolic CHF (congestive heart failure) ([place]) 05/02/2019  . Palliative care by specialist   . Goals of care, counseling/discussion   . Generalized weakness   . Pleural effusion 04/13/2019  . Pancreatitis, acute 04/13/2019  . Acute respiratory failure with hypoxia ([place]) 04/13/2019  . Bradycardia 04/10/2019  . Hypotension 04/10/2019  . Hyperkalemia 04/10/2019  . ARF (acute renal failure) ([place]) 04/10/2019  . Atrial fibrillation ([place]) 04/06/2019  . A-fib ([place]) 04/04/2019  . Dementia ([place]) 12/12/2018  . GERD without esophagitis 12/06/2018  . Post-menopausal osteoporosis 12/06/2018  . Acute delirium 12/06/2018  .  Hypomagnesemia 12/06/2018  . Acute blood loss anemia 12/06/2018  . Closed fracture of right femur, unspecified fracture morphology, initial encounter ([place]) 11/28/2018  . Acute diastolic CHF (congestive heart failure) ([place]) 11/28/2018  . Fracture, intertrochanteric, right femur ([place]) 11/28/2018  . Atrial fibrillation with rapid ventricular response ([place]) 11/25/2018  . Atrial fibrillation with RVR ([place]) 11/24/2018  . Chronic back pain 11/24/2018  . Anxiety and depression 11/24/2018  . Chronic constipation 05/03/2016  . Dysphagia 05/03/2016  . Cholecystitis with cholelithiasis 01/29/2014   PCP:  [name],  MD Pharmacy:   [place], [place] [id number] [place] [id number] Phone: [phone] Fax: [phone]  [facility] [facility] - [address] [place], [place] [address] Phone: [phone] Fax: [phone]     Social Determinants of Health (SDOH) Interventions    Readmission Risk Interventions Readmission Risk Prevention Plan 05/02/2019 11/27/2018  Post Dischage Appt - Not Complete  Appt Comments - cardiologist will call patient for appt.   Medication Screening - Complete  Transportation Screening Complete Complete  Medication Review Press photographer) Complete -  Some recent data might be hidden

## 2019-05-02 NOTE — H&P (Signed)
History and Physical  Kristy Mckinney [id number] DOB: [birth year] DOA: 05/02/2019   PCP: [name], MD   Patient coming from: Home  Chief Complaint: dyspnea  HPI:  Kristy Mckinney is a 85 year old female with a history of permanent atrial fibrillation, anxiety, diastolic CHF, GERD presenting with 1 week history of shortness of breath.  Patient was recently admitted to the hospital from 04/10/2019 through 04/19/2019 for acute kidney injury secondary to gastroenteritis.  She was discharged from to [facility] where she was not initially restarted on her furosemide.  Her daughter contacted her cardiologist, and the patient received 3 days of furosemide at the facility after which she did not receive any further furosemide.  In the interim, the patient had noted increasing lower extremity edema, orthopnea, and dyspnea on exertion.  The patient had a visit with her cardiology office on 05/01/2019.  Admission was offered, but the patient did not want to be admitted at that time.  Unfortunately, she developed worsening shortness of breath resulting in her ED visit.  She denies any fevers, chills, chest pain, headache, coughing, hemoptysis, nausea, vomiting, diarrhea, abdominal pain.  Notably, the patient also had an admission earlier this calendar year from 11/28/2018 through 12/05/2018 during which she sustained a nondisplaced right intertrochanteric femur fracture requiring repair. In the ED, the patient was afebrile hemodynamically stable with oxygen saturation of 95% room air.  She was started on IV furosemide.  BMP and CBC were essentially unremarkable.  BNP was 473.  Chest x-ray showed bilateral pleural effusions and vascular congestion.  Assessment/Plan: Acute on chronic diastolic CHF -99991111 echo EF 70 5%, Severe Mitral Calcification, RVSP 61.9, Mild TR -Continue furosemide 40 mg IV twice daily -Daily weights -Accurate I's and O's  Permanent atrial fibrillation -Rate  controlled -Continue apixaban -Continue diltiazem CD -Continue metoprolol tartrate  Dementia with behavioral disturbance -Continue nighttime Seroquel -Continue Namenda -Continue Zoloft  GERD -Continue Protonix  Depression/anxiety -Continue Zoloft          Past Medical History:  Diagnosis Date  . Atrial fibrillation ([place])   . Chronic back pain   . Diarrhea   . Dysphagia 05/03/2016  . GERD (gastroesophageal reflux disease)    Past Surgical History:  Procedure Laterality Date  . ABDOMINAL HYSTERECTOMY    . BACK SURGERY    . CHOLECYSTECTOMY N/A 01/29/2014   Procedure: LAPAROSCOPIC CHOLECYSTECTOMY;  Surgeon: Scherry Ran, MD;  Location: [facility];  Service: General;  Laterality: N/A;  . COLONOSCOPY N/A 10/27/2015   Procedure: COLONOSCOPY;  Surgeon: Rogene Houston, MD;  [facility];  Service: Endoscopy;  Laterality: N/A;  215  . INTRAMEDULLARY (IM) NAIL INTERTROCHANTERIC Right 11/29/2018   Procedure: INTRAMEDULLARY (IM) NAIL INTERTROCHANTRIC FRACTURE;  Surgeon: Renette Butters, MD;  Location: [place];  Service: Orthopedics;  Laterality: Right;  . TONSILLECTOMY    . YAG LASER APPLICATION Left [id number]   Procedure: YAG LASER APPLICATION;  Surgeon: Elta Guadeloupe T. Gershon Crane, MD;  Location: [facility];  Service: Ophthalmology;  Laterality: Left;  left   Social History:  reports that she has never smoked. She has never used smokeless tobacco. She reports that she does not drink alcohol or use drugs.   Family History  Problem Relation Age of Onset  . Hypertension Child      Allergies  Allergen Reactions  . Penicillins Anaphylaxis  . Emetrol Nausea Only    swelling  . Feldene [Piroxicam] Nausea And Vomiting  . Sulfa Antibiotics  swelling     Prior to Admission medications   Medication Sig Start Date End Date Taking? Authorizing Provider  acetaminophen (TYLENOL) 325 MG tablet Take 650 mg by mouth every 6 (six) hours as needed. 12/05/18   [provider]  alendronate (FOSAMAX) 70 MG tablet Take 1 tablet (70 mg total) by mouth once a week. Take with a full glass of water on an empty stomach. 12/23/18   Gerlene Fee, NP  apixaban (ELIQUIS) 5 MG TABS tablet Take 1 tablet (5 mg total) by mouth 2 (two) times daily. 04/07/19   [name], MD  Balsam [place]-Castor Oil [facility]) OINT Apply topically to sacrum and bilateral buttocks every shift and as needed for prevention 12/06/18   [provider]  calcium-vitamin D (OS-CAL 500 + D) 500-200 MG-UNIT tablet Take 1 tablet by mouth 2 (two) times daily. 12/05/18   Desiree Hane, MD  diltiazem (CARDIZEM CD) 360 MG 24 hr capsule Take 1 capsule (360 mg total) by mouth daily. 04/07/19   [name], MD  feeding supplement, ENSURE ENLIVE, (ENSURE ENLIVE) LIQD Take 237 mLs by mouth 2 (two) times daily between meals. 12/05/18   Desiree Hane, MD  furosemide (LASIX) 40 MG tablet Take 1 tablet (40 mg total) by mouth daily. 05/01/19 07/30/19  Desouza, Fransisco Hertz, PA-C  magnesium oxide (MAG-OX) 400 MG tablet Take 1 tablet (400 mg total) by mouth 3 (three) times daily. 12/23/18   Gerlene Fee, NP  memantine (NAMENDA) 5 MG tablet Take 1 tablet (5 mg total) by mouth 2 (two) times daily. 12/23/18   Gerlene Fee, NP  metoprolol tartrate (LOPRESSOR) 25 MG tablet Take 1 tablet (25 mg total) by mouth 2 (two) times daily. 04/08/19   [name], MD  mirtazapine (REMERON) 7.5 MG tablet Take 1 tablet (7.5 mg total) by mouth at bedtime. 04/19/19 04/18/20  Roxan Hockey, MD  Multiple Vitamin (MULTIVITAMIN WITH MINERALS) TABS tablet Take 1 tablet by mouth daily. 12/05/18   Desiree Hane, MD  NON FORMULARY Diet Type: NAS 12/05/18   [provider]  ondansetron (ZOFRAN) 4 MG tablet Take 1 tablet (4 mg total) by mouth every 8 (eight) hours as needed for nausea or vomiting. 04/19/19   Roxan Hockey, MD  Ostomy Supplies (SKIN PREP WIPES) MISC Apply to bilateral heels every shift for  prevention 12/06/18   [provider]  pantoprazole (PROTONIX) 40 MG tablet Take 1 tablet (40 mg total) by mouth daily. 04/19/19   Roxan Hockey, MD  Polyethyl Glycol-Propyl Glycol (SYSTANE OP) Place 1 drop into both eyes daily as needed. Dry Eyes     [provider]  polyethylene glycol (MIRALAX / GLYCOLAX) 17 g packet Take 17 g by mouth daily as needed for mild constipation. 12/05/18   Desiree Hane, MD  QUEtiapine (SEROQUEL) 25 MG tablet Take 1 tablet (25 mg total) by mouth at bedtime. 12/23/18   Gerlene Fee, NP  sertraline (ZOLOFT) 50 MG tablet Take 1 tablet (50 mg total) by mouth every morning. 12/23/18   Gerlene Fee, NP    Review of Systems:  Constitutional:  No weight loss, night sweats, Fevers, chills, fatigue.  Head&Eyes: No headache.  No vision loss.  No eye pain or scotoma ENT:  No Difficulty swallowing,Tooth/dental problems,Sore throat,  No ear ache, post nasal drip,  Cardio-vascular:  No chest pain, dizziness, palpitations  GI:  No  abdominal pain, nausea, vomiting, diarrhea, loss of appetite, hematochezia, melena, heartburn, indigestion, Resp:  No shortness of breath with exertion or at rest. No cough. No coughing up of blood .No wheezing.No chest wall deformity  Skin:  no rash or lesions.  GU:  no dysuria, change in color of urine, no urgency or frequency. No flank pain.  Musculoskeletal:  No joint pain or swelling. No decreased range of motion. No back pain.  Psych:  No change in mood or affect. No depression or anxiety. Neurologic: No headache, no dysesthesia, no focal weakness, no vision loss. No syncope  Physical Exam: Vitals:   05/02/19 0530 05/02/19 0600 05/02/19 0615 05/02/19 0630  BP: 124/69 121/84  127/72  Pulse: 81  94 91  Resp: 17  20 19   Temp:      TempSrc:      SpO2: 94%  96% 92%  Weight:      Height:       General:  A&O x 3, NAD, nontoxic, pleasant/cooperative Head/Eye: No conjunctival hemorrhage, no icterus,  [place]/AT, No nystagmus ENT:  No icterus,  No thrush, good dentition, no pharyngeal exudate Neck:  No masses, no lymphadenpathy, no bruits CV:  RRR, no rub, no gallop, no S3+JVD Lung:  Bibasilar crackles, no wheeze Abdomen: soft/NT, +BS, nondistended, no peritoneal signs Ext: No cyanosis, No rashes, No petechiae, No lymphangitis, 2+LE edema Neuro: CNII-XII intact, strength 4/5 in bilateral upper and lower extremities, no dysmetria  Labs on Admission:  Basic Metabolic Panel: Recent Labs  Lab 05/02/19 0505  NA 137  K 3.5  CL 100  CO2 26  GLUCOSE 114*  BUN 29*  CREATININE 1.05*  CALCIUM 9.2   Liver Function Tests: No results for input(s): AST, ALT, ALKPHOS, BILITOT, PROT, ALBUMIN in the last 168 hours. No results for input(s): LIPASE, AMYLASE in the last 168 hours. No results for input(s): AMMONIA in the last 168 hours. CBC: Recent Labs  Lab 05/02/19 0505  WBC 6.3  NEUTROABS 4.2  HGB 11.1*  HCT 37.6  MCV 86.6  PLT 231   Coagulation Profile: No results for input(s): INR, PROTIME in the last 168 hours. Cardiac Enzymes: No results for input(s): CKTOTAL, CKMB, CKMBINDEX, TROPONINI in the last 168 hours. BNP: Invalid input(s): POCBNP CBG: No results for input(s): GLUCAP in the last 168 hours. Urine analysis:    Component Value Date/Time   COLORURINE YELLOW 04/18/2019 0938   APPEARANCEUR HAZY (A) 04/18/2019 0938   LABSPEC 1.009 04/18/2019 0938   PHURINE 6.0 04/18/2019 0938   GLUCOSEU NEGATIVE 04/18/2019 0938   HGBUR MODERATE (A) 04/18/2019 0938   BILIRUBINUR NEGATIVE 04/18/2019 0938   KETONESUR NEGATIVE 04/18/2019 0938   PROTEINUR NEGATIVE 04/18/2019 0938   NITRITE NEGATIVE 04/18/2019 0938   LEUKOCYTESUR SMALL (A) 04/18/2019 0938   Sepsis Labs: @LABRCNTIP (procalcitonin:4,lacticidven:4) )No results found for this or any previous visit (from the past 240 hour(s)).   Radiological Exams on Admission: Dg Chest Port 1 View  Result Date: 05/02/2019 CLINICAL DATA:   Shortness of breath and difficulty urinating EXAM: PORTABLE CHEST 1 VIEW COMPARISON:  04/13/2019 FINDINGS: Layering pleural effusions that are slightly greater on the right, with interstitial and hazy opacity at the bases. Cardiomegaly. Cephalized blood flow. No pneumothorax IMPRESSION: Bilateral pleural effusion and edema/vascular congestion. No significant change since study earlier this month. Electronically Signed   By: Monte Fantasia M.D.   On: 05/02/2019 05:56    EKG: Independently reviewed. Afib, nonspecific T wave change    Time spent:60 minutes Code Status:   FULL Family Communication:  Daughter updated at bedside 10/23 Disposition Plan: expect  2-3 day hospitalization Consults called: none DVT Prophylaxis: apixaban  Orson Eva, DO  [facility] Pager [phone]  If 7PM-7AM, please contact night-coverage [email or web address] Password [email or web address] 05/02/2019, 8:49 AM     3

## 2019-05-02 NOTE — ED Provider Notes (Signed)
[facility] EMERGENCY DEPARTMENT Provider Note   CSN: [id number] Arrival date & time: 05/02/19  0454     History   Chief Complaint Chief Complaint  Patient presents with   Shortness of Breath    HPI Kristy Mckinney is a 85 y.o. female.     Patient presents with shortness of breath.  States this is been progressing for the past 3 months but became acutely worse this morning.  She has a history of diastolic heart failure and saw her cardiologist in the office yesterday.  She has been taking Lasix 40 mg daily for about the past week.  She states she continues to feel short of breath and felt like she could not sleep at all last night.  Normally she lies on her side.  She was offered admission yesterday but chose to go home.  She states she has had increased swelling and pain in her legs and abdomen as well.  Cough is nonproductive.  No fever.  No abdominal pain.  States compliance with her blood thinners.  Recent admission for AKI and hyperkalemia.  States she normally does not take Lasix up until the past week when her weight was increasing and she had more fluid on her.  The history is provided by the patient.  Shortness of Breath Associated symptoms: no abdominal pain, no chest pain, no cough, no fever, no headaches, no rash and no vomiting     Past Medical History:  Diagnosis Date   Atrial fibrillation (HCC)    Chronic back pain    Diarrhea    Dysphagia 05/03/2016   GERD (gastroesophageal reflux disease)     Patient Active Problem List   Diagnosis Date Noted   Palliative care by specialist    Goals of care, counseling/discussion    Generalized weakness    Pleural effusion 04/13/2019   Pancreatitis, acute 04/13/2019   Acute respiratory failure with hypoxia (HCC) 04/13/2019   Bradycardia 04/10/2019   Hypotension 04/10/2019   Hyperkalemia 04/10/2019   ARF (acute renal failure) ([place]) 04/10/2019   Atrial fibrillation ([place]) 04/06/2019   A-fib ([place])  04/04/2019   Dementia ([place]) 12/12/2018   GERD without esophagitis 12/06/2018   Post-menopausal osteoporosis 12/06/2018   Acute delirium 12/06/2018   Hypomagnesemia 12/06/2018   Acute blood loss anemia 12/06/2018   Closed fracture of right femur, unspecified fracture morphology, initial encounter ([place]) [id number]   Acute diastolic CHF (congestive heart failure) ([place]) 11/28/2018   Fracture, intertrochanteric, right femur ([place]) 11/28/2018   Atrial fibrillation with rapid ventricular response ([place]) 11/25/2018   Atrial fibrillation with RVR ([place]) 11/24/2018   Chronic back pain 11/24/2018   Anxiety and depression 11/24/2018   Chronic constipation 05/03/2016   Dysphagia 05/03/2016   Cholecystitis with cholelithiasis 01/29/2014    Past Surgical History:  Procedure Laterality Date   ABDOMINAL HYSTERECTOMY     BACK SURGERY     CHOLECYSTECTOMY N/A 01/29/2014   Procedure: LAPAROSCOPIC CHOLECYSTECTOMY;  Surgeon: Scherry Ran, MD;  Location: [facility];  Service: General;  Laterality: N/A;   COLONOSCOPY N/A 10/27/2015   Procedure: COLONOSCOPY;  Surgeon: Rogene Houston, MD;  [facility];  Service: Endoscopy;  Laterality: N/A;  215   INTRAMEDULLARY (IM) NAIL INTERTROCHANTERIC Right 11/29/2018   Procedure: INTRAMEDULLARY (IM) NAIL INTERTROCHANTRIC FRACTURE;  Surgeon: Renette Butters, MD;  Location: [place];  Service: Orthopedics;  Laterality: Right;   TONSILLECTOMY     YAG LASER APPLICATION Left [id number]   Procedure: YAG LASER APPLICATION;  Surgeon:  Mark T. Gershon Crane, MD;  Location: [facility];  Service: Ophthalmology;  Laterality: Left;  left     OB History   No obstetric history on file.      Home Medications    Prior to Admission medications   Medication Sig Start Date End Date Taking? Authorizing Provider  acetaminophen (TYLENOL) 325 MG tablet Take 650 mg by mouth every 6 (six) hours as needed. 12/05/18   [provider]  alendronate (FOSAMAX) 70  MG tablet Take 1 tablet (70 mg total) by mouth once a week. Take with a full glass of water on an empty stomach. 12/23/18   Gerlene Fee, NP  apixaban (ELIQUIS) 5 MG TABS tablet Take 1 tablet (5 mg total) by mouth 2 (two) times daily. 04/07/19   [name], MD  Balsam [place]-Castor Oil [facility]) OINT Apply topically to sacrum and bilateral buttocks every shift and as needed for prevention 12/06/18   [provider]  calcium-vitamin D (OS-CAL 500 + D) 500-200 MG-UNIT tablet Take 1 tablet by mouth 2 (two) times daily. 12/05/18   Desiree Hane, MD  diltiazem (CARDIZEM CD) 360 MG 24 hr capsule Take 1 capsule (360 mg total) by mouth daily. 04/07/19   [name], MD  feeding supplement, ENSURE ENLIVE, (ENSURE ENLIVE) LIQD Take 237 mLs by mouth 2 (two) times daily between meals. 12/05/18   Desiree Hane, MD  furosemide (LASIX) 40 MG tablet Take 1 tablet (40 mg total) by mouth daily. 05/01/19 07/30/19  Gipson, Fransisco Hertz, PA-C  magnesium oxide (MAG-OX) 400 MG tablet Take 1 tablet (400 mg total) by mouth 3 (three) times daily. 12/23/18   Gerlene Fee, NP  memantine (NAMENDA) 5 MG tablet Take 1 tablet (5 mg total) by mouth 2 (two) times daily. 12/23/18   Gerlene Fee, NP  metoprolol tartrate (LOPRESSOR) 25 MG tablet Take 1 tablet (25 mg total) by mouth 2 (two) times daily. 04/08/19   [name], MD  mirtazapine (REMERON) 7.5 MG tablet Take 1 tablet (7.5 mg total) by mouth at bedtime. 04/19/19 04/18/20  Roxan Hockey, MD  Multiple Vitamin (MULTIVITAMIN WITH MINERALS) TABS tablet Take 1 tablet by mouth daily. 12/05/18   Desiree Hane, MD  NON FORMULARY Diet Type: NAS 12/05/18   [provider]  ondansetron (ZOFRAN) 4 MG tablet Take 1 tablet (4 mg total) by mouth every 8 (eight) hours as needed for nausea or vomiting. 04/19/19   Roxan Hockey, MD  Ostomy Supplies (SKIN PREP WIPES) MISC Apply to bilateral heels every shift for prevention 12/06/18   [provider]  pantoprazole (PROTONIX) 40 MG tablet Take 1 tablet (40 mg total) by mouth daily. 04/19/19   Roxan Hockey, MD  Polyethyl Glycol-Propyl Glycol (SYSTANE OP) Place 1 drop into both eyes daily as needed. Dry Eyes     [provider]  polyethylene glycol (MIRALAX / GLYCOLAX) 17 g packet Take 17 g by mouth daily as needed for mild constipation. 12/05/18   Desiree Hane, MD  QUEtiapine (SEROQUEL) 25 MG tablet Take 1 tablet (25 mg total) by mouth at bedtime. 12/23/18   Gerlene Fee, NP  sertraline (ZOLOFT) 50 MG tablet Take 1 tablet (50 mg total) by mouth every morning. 12/23/18   Gerlene Fee, NP    Family History Family History  Problem Relation Age of Onset   Hypertension Child     Social History Social History   Tobacco Use   Smoking status: Never Smoker  Smokeless tobacco: Never Used  Substance Use Topics   Alcohol use: No   Drug use: No     Allergies   Penicillins, Emetrol, Feldene [piroxicam], and Sulfa antibiotics   Review of Systems Review of Systems  Constitutional: Negative for activity change, appetite change and fever.  HENT: Negative for congestion and rhinorrhea.   Respiratory: Positive for shortness of breath. Negative for cough and chest tightness.   Cardiovascular: Positive for leg swelling. Negative for chest pain.  Gastrointestinal: Positive for abdominal distention. Negative for abdominal pain, nausea and vomiting.  Genitourinary: Negative for dysuria and hematuria.  Musculoskeletal: Negative for arthralgias and myalgias.  Skin: Negative for rash.  Neurological: Negative for dizziness, weakness, light-headedness and headaches.   all other systems are negative except as noted in the HPI and PMH.     Physical Exam Updated Vital Signs BP 105/75 (BP Location: Left Arm)    Pulse 81    Temp 97.8 F (36.6 C) (Oral)    Resp (!) 22    Ht 5\' 4"  (1.626 m)    Wt 73 kg    SpO2 94%    BMI 27.64 kg/m   Physical Exam Vitals signs and  nursing note reviewed.  Constitutional:      General: She is not in acute distress.    Appearance: She is well-developed.     Comments: No distress, speaking full sentences  HENT:     Head: Normocephalic and atraumatic.     Mouth/Throat:     Pharynx: No oropharyngeal exudate.  Eyes:     Conjunctiva/sclera: Conjunctivae normal.     Pupils: Pupils are equal, round, and reactive to light.  Neck:     Musculoskeletal: Normal range of motion and neck supple.     Comments: No meningismus. Cardiovascular:     Rate and Rhythm: Normal rate. Rhythm irregular.     Heart sounds: Murmur present.  Pulmonary:     Effort: Pulmonary effort is normal. No respiratory distress.     Breath sounds: Rales present.     Comments: Bibasilar crackles Abdominal:     Palpations: Abdomen is soft.     Tenderness: There is no abdominal tenderness. There is no guarding or rebound.  Musculoskeletal: Normal range of motion.        General: No tenderness.     Right lower leg: Edema present.     Left lower leg: Edema present.     Comments: +2 edema to knees  Skin:    General: Skin is warm.     Capillary Refill: Capillary refill takes less than 2 seconds.  Neurological:     General: No focal deficit present.     Mental Status: She is alert and oriented to person, place, and time. Mental status is at baseline.     Cranial Nerves: No cranial nerve deficit.     Motor: No abnormal muscle tone.     Coordination: Coordination normal.     Comments:  5/5 strength throughout. CN 2-12 intact.Equal grip strength.   Psychiatric:        Behavior: Behavior normal.      ED Treatments / Results  Labs (all labs ordered are listed, but only abnormal results are displayed) Labs Reviewed  CBC WITH DIFFERENTIAL/PLATELET - Abnormal; Notable for the following components:      Result Value   Hemoglobin 11.1 (*)    MCH 25.6 (*)    MCHC 29.5 (*)    RDW 16.0 (*)    All other  components within normal limits  BASIC METABOLIC  PANEL - Abnormal; Notable for the following components:   Glucose, Bld 114 (*)    BUN 29 (*)    Creatinine, Ser 1.05 (*)    GFR calc non Af Amer 49 (*)    GFR calc Af Amer 56 (*)    All other components within normal limits  BRAIN NATRIURETIC PEPTIDE - Abnormal; Notable for the following components:   B Natriuretic Peptide 473.0 (*)    All other components within normal limits  SARS CORONAVIRUS 2 (TAT 6-24 HRS)  TROPONIN I (HIGH SENSITIVITY)  TROPONIN I (HIGH SENSITIVITY)    EKG EKG Interpretation  Date/Time:  Friday May 02 2019 05:07:53 EDT Ventricular Rate:  92 PR Interval:    QRS Duration: 89 QT Interval:  376 QTC Calculation: 466 R Axis:   -37 Text Interpretation:  Atrial fibrillation Left axis deviation Low voltage, precordial leads Probable anteroseptal infarct, old No significant change was found Confirmed by Ezequiel Essex [phone]) on 05/02/2019 5:15:50 AM   Radiology Dg Chest Port 1 View  Result Date: 05/02/2019 CLINICAL DATA:  Shortness of breath and difficulty urinating EXAM: PORTABLE CHEST 1 VIEW COMPARISON:  04/13/2019 FINDINGS: Layering pleural effusions that are slightly greater on the right, with interstitial and hazy opacity at the bases. Cardiomegaly. Cephalized blood flow. No pneumothorax IMPRESSION: Bilateral pleural effusion and edema/vascular congestion. No significant change since study earlier this month. Electronically Signed   By: Monte Fantasia M.D.   On: 05/02/2019 05:56    Procedures Procedures (including critical care time)  Medications Ordered in ED Medications  furosemide (LASIX) injection 40 mg (has no administration in time range)     Initial Impression / Assessment and Plan / ED Course  I have reviewed the triage vital signs and the nursing notes.  Pertinent labs & imaging results that were available during my care of the patient were reviewed by me and considered in my medical decision making (see chart for details).         Patient presents with difficulty breathing progressively worsening.  Saw cardiology yesterday and was treated for a diastolic CHF exacerbation.  States compliance with her anticoagulation.  She is in no distress.  She does have evidence of volume overload with basilar crackles and lower extremity edema.  She is given IV Lasix.  Labs show stable creatinine.  Chest x-ray consistent with vascular congestion.  Patient did have some diuresis to IV Lasix.  She was able to ambulate without desaturation but became dyspneic and short of breath.  Inpatient versus outpatient management discussed with patient and daughter.  They would prefer that she be admitted for IV diuresis as she has not slept at home for the past several nights and she feels that her breathing is not improving.  States compliance with her anticoagulation.  Low suspicion for pulmonary embolism or ACS.  D/w Dr. Patty Sermons was evaluated in Emergency Department on 05/02/2019 for the symptoms described in the history of present illness. She was evaluated in the context of the global COVID-19 pandemic, which necessitated consideration that the patient might be at risk for infection with the SARS-CoV-2 virus that causes COVID-19. Institutional protocols and algorithms that pertain to the evaluation of patients at risk for COVID-19 are in a state of rapid change based on information released by regulatory bodies including the CDC and federal and state organizations. These policies and algorithms were followed during the patient's care in the ED.  Final Clinical Impressions(s) / ED Diagnoses   Final diagnoses:  Acute on chronic diastolic congestive heart failure [facility])    ED Discharge Orders    None       Ezequiel Essex, MD 05/02/19 [phone]

## 2019-05-02 NOTE — ED Notes (Addendum)
Pt ambulated to restroom and returned to bed . VSS.  Pulse oximetry remained 94%

## 2019-05-02 NOTE — ED Triage Notes (Signed)
Pt states she has had diff breathing for the past 3 months, states she got scared this morning when it seemed to worsen.  Pt also reports she has diff urinating.

## 2019-05-02 NOTE — Progress Notes (Signed)
Xcover Called by RN due to hr 64 and sbp 107, and pt is not sure if taking metoprolol at home, will hold tonights dose.

## 2019-05-02 NOTE — ED Notes (Signed)
Pt was given a breakfast tray 

## 2019-05-03 LAB — BASIC METABOLIC PANEL
Anion gap: 12 (ref 5–15)
BUN: 26 mg/dL — ABNORMAL HIGH (ref 8–23)
CO2: 31 mmol/L (ref 22–32)
Calcium: 9.3 mg/dL (ref 8.9–10.3)
Chloride: 97 mmol/L — ABNORMAL LOW (ref 98–111)
Creatinine, Ser: 0.93 mg/dL (ref 0.44–1.00)
GFR calc Af Amer: >60 mL/min (ref >60)
GFR calc non Af Amer: 56 mL/min — ABNORMAL LOW (ref >60)
Glucose, Bld: 105 mg/dL — ABNORMAL HIGH (ref 70–99)
Potassium: 3.5 mmol/L (ref 3.5–5.1)
Sodium: 140 mmol/L (ref 135–145)

## 2019-05-03 LAB — GLUCOSE, CAPILLARY: Glucose-Capillary: 114 mg/dL — ABNORMAL HIGH (ref 70–99)

## 2019-05-03 MED ORDER — ZOLPIDEM TARTRATE 5 MG PO TABS
5.0000 mg | ORAL_TABLET | Freq: Every evening | ORAL | Status: AC | PRN
  Administered 2019-05-03 – 2019-05-04 (×2): 5 mg via ORAL
  Filled 2019-05-03 (×2): qty 1

## 2019-05-03 MED ORDER — POTASSIUM CHLORIDE CRYS ER 20 MEQ PO TBCR
20.0000 meq | EXTENDED_RELEASE_TABLET | Freq: Two times a day (BID) | ORAL | Status: DC
  Administered 2019-05-03: 20 meq via ORAL
  Filled 2019-05-03 (×2): qty 1

## 2019-05-03 NOTE — Progress Notes (Signed)
Subjective: She was admitted yesterday with heart failure.  Her family had called me while she was in the nursing home stating that she had a lot of fluid in her legs and I called the nursing home but apparently nothing was changed.  I do not have privileges at the nursing home that she was at.  She is still short of breath.  Still swelling.  She was hypoxic last night.  Objective: Vital signs in last 24 hours: Temp:  [97.6 F (36.4 C)-98.2 F (36.8 C)] 98.2 F (36.8 C) (10/24 0709) Pulse Rate:  [61-100] 84 (10/24 0709) Resp:  [16-28] 16 (10/24 0709) BP: (93-133)/(45-84) 133/63 (10/24 0830) SpO2:  [89 %-100 %] 98 % (10/24 0837) Weight:  [70 kg] 70 kg (10/23 1900) Weight change: -3.039 kg Last BM Date: 05/02/19  Intake/Output from previous day: 10/23 0701 - 10/24 0700 In: 246 [P.O.:240; I.V.:6] Out: 1050 [Urine:1050]  PHYSICAL EXAM General appearance: alert, cooperative and no distress Resp: rales bilaterally Cardio: irregularly irregular rhythm GI: soft, non-tender; bowel sounds normal; no masses,  no organomegaly Extremities: extremities normal, atraumatic, no cyanosis or edema  Lab Results:  Results for orders placed or performed during the hospital encounter of 05/02/19 (from the past 48 hour(s))  CBC with Differential/Platelet     Status: Abnormal   Collection Time: 05/02/19  5:05 AM  Result Value Ref Range   WBC 6.3 4.0 - 10.5 K/uL   RBC 4.34 3.87 - 5.11 MIL/uL   Hemoglobin 11.1 (L) 12.0 - 15.0 g/dL   HCT 37.6 36.0 - 46.0 %   MCV 86.6 80.0 - 100.0 fL   MCH 25.6 (L) 26.0 - 34.0 pg   MCHC 29.5 (L) 30.0 - 36.0 g/dL   RDW 16.0 (H) 11.5 - 15.5 %   Platelets 231 150 - 400 K/uL   nRBC 0.0 0.0 - 0.2 %   Neutrophils Relative % 66 %   Neutro Abs 4.2 1.7 - 7.7 K/uL   Lymphocytes Relative 21 %   Lymphs Abs 1.3 0.7 - 4.0 K/uL   Monocytes Relative 11 %   Monocytes Absolute 0.7 0.1 - 1.0 K/uL   Eosinophils Relative 1 %   Eosinophils Absolute 0.1 0.0 - 0.5 K/uL   Basophils  Relative 1 %   Basophils Absolute 0.1 0.0 - 0.1 K/uL   Immature Granulocytes 0 %   Abs Immature Granulocytes 0.02 0.00 - 0.07 K/uL    Comment: Performed at [facility], [address], [place], [place] [id number]  Basic metabolic panel     Status: Abnormal   Collection Time: 05/02/19  5:05 AM  Result Value Ref Range   Sodium 137 135 - 145 mmol/L   Potassium 3.5 3.5 - 5.1 mmol/L   Chloride 100 98 - 111 mmol/L   CO2 26 22 - 32 mmol/L   Glucose, Bld 114 (H) 70 - 99 mg/dL   BUN 29 (H) 8 - 23 mg/dL   Creatinine, Ser 1.05 (H) 0.44 - 1.00 mg/dL   Calcium 9.2 8.9 - 10.3 mg/dL   GFR calc non Af Amer 49 (L) >60 mL/min   GFR calc Af Amer 56 (L) >60 mL/min   Anion gap 11 5 - 15    Comment: Performed at [facility], [address], [place], [place] [address]  Troponin I (High Sensitivity)     Status: None   Collection Time: 05/02/19  5:05 AM  Result Value Ref Range   Troponin I (High Sensitivity) 8 <18 ng/L  Comment: (NOTE) Elevated high sensitivity troponin I (hsTnI) values and significant  changes across serial measurements may suggest ACS but many other  chronic and acute conditions are known to elevate hsTnI results.  Refer to the "Links" section for chest pain algorithms and additional  guidance. Performed at [facility], [address] [place], [place] [address]   Brain natriuretic peptide     Status: Abnormal   Collection Time: 05/02/19  5:05 AM  Result Value Ref Range   B Natriuretic Peptide 473.0 (H) 0.0 - 100.0 pg/mL    Comment: Performed at [facility], [address] [place], [place] [address]  SARS CORONAVIRUS 2 (TAT 6-24 HRS) Nasopharyngeal Nasopharyngeal Swab     Status: None   Collection Time: 05/02/19  5:26 AM   Specimen: Nasopharyngeal Swab  Result Value Ref Range   SARS Coronavirus 2 NEGATIVE NEGATIVE    Comment: (NOTE) SARS-CoV-2 target nucleic acids are NOT DETECTED. The SARS-CoV-2 RNA is generally detectable in upper and lower respiratory specimens during the  acute phase of infection. Negative results do not preclude SARS-CoV-2 infection, do not rule out co-infections with other pathogens, and should not be used as the sole basis for treatment or other patient management decisions. Negative results must be combined with clinical observations, patient history, and epidemiological information. The expected result is Negative. Fact Sheet for Patients: SugarRoll.be Fact Sheet for Healthcare Providers: https://www.woods-mathews.com/ This test is not yet approved or cleared by the [place] FDA and  has been authorized for detection and/or diagnosis of SARS-CoV-2 by FDA under an Emergency Use Authorization (EUA). This EUA will remain  in effect (meaning this test can be used) for the duration of the COVID-19 declaration under Section 56 4(b)(1) of the Act, 21 U.S.C. section 360bbb-3(b)(1), unless the authorization is terminated or revoked sooner. Performed at [facility], [place] [address] [place], [place] [address]   Troponin I (High Sensitivity)     Status: None   Collection Time: 05/02/19  7:30 AM  Result Value Ref Range   Troponin I (High Sensitivity) 8 <18 ng/L    Comment: (NOTE) Elevated high sensitivity troponin I (hsTnI) values and significant  changes across serial measurements may suggest ACS but many other  chronic and acute conditions are known to elevate hsTnI results.  Refer to the "Links" section for chest pain algorithms and additional  guidance. Performed at [facility], [address] [place], [place] [address]   Glucose, capillary     Status: Abnormal   Collection Time: 05/03/19  4:49 AM  Result Value Ref Range   Glucose-Capillary 114 (H) 70 - 99 mg/dL   Comment 1 Notify RN   Basic metabolic panel     Status: Abnormal   Collection Time: 05/03/19  6:50 AM  Result Value Ref Range   Sodium 140 135 - 145 mmol/L   Potassium 3.5 3.5 - 5.1 mmol/L   Chloride 97 (L) 98 - 111  mmol/L   CO2 31 22 - 32 mmol/L   Glucose, Bld 105 (H) 70 - 99 mg/dL   BUN 26 (H) 8 - 23 mg/dL   Creatinine, Ser 0.93 0.44 - 1.00 mg/dL   Calcium 9.3 8.9 - 10.3 mg/dL   GFR calc non Af Amer 56 (L) >60 mL/min   GFR calc Af Amer >60 >60 mL/min   Anion gap 12 5 - 15    Comment: Performed at [facility], [address], [place], [place] [address]    ABGS No results  for input(s): PHART, PO2ART, TCO2, HCO3 in the last 72 hours.  Invalid input(s): PCO2 CULTURES Recent Results (from the past 240 hour(s))  SARS CORONAVIRUS 2 (TAT 6-24 HRS) Nasopharyngeal Nasopharyngeal Swab     Status: None   Collection Time: 05/02/19  5:26 AM   Specimen: Nasopharyngeal Swab  Result Value Ref Range Status   SARS Coronavirus 2 NEGATIVE NEGATIVE Final    Comment: (NOTE) SARS-CoV-2 target nucleic acids are NOT DETECTED. The SARS-CoV-2 RNA is generally detectable in upper and lower respiratory specimens during the acute phase of infection. Negative results do not preclude SARS-CoV-2 infection, do not rule out co-infections with other pathogens, and should not be used as the sole basis for treatment or other patient management decisions. Negative results must be combined with clinical observations, patient history, and epidemiological information. The expected result is Negative. Fact Sheet for Patients: SugarRoll.be Fact Sheet for Healthcare Providers: https://www.woods-mathews.com/ This test is not yet approved or cleared by the [place] FDA and  has been authorized for detection and/or diagnosis of SARS-CoV-2 by FDA under an Emergency Use Authorization (EUA). This EUA will remain  in effect (meaning this test can be used) for the duration of the COVID-19 declaration under Section 56 4(b)(1) of the Act, 21 U.S.C. section 360bbb-3(b)(1), unless the authorization is terminated or revoked sooner. Performed at [facility], [place] [address]  [place], [place] [address]    Studies/Results: Dg Chest Port 1 View  Result Date: 05/02/2019 CLINICAL DATA:  Shortness of breath and difficulty urinating EXAM: PORTABLE CHEST 1 VIEW COMPARISON:  04/13/2019 FINDINGS: Layering pleural effusions that are slightly greater on the right, with interstitial and hazy opacity at the bases. Cardiomegaly. Cephalized blood flow. No pneumothorax IMPRESSION: Bilateral pleural effusion and edema/vascular congestion. No significant change since study earlier this month. Electronically Signed   By: Monte Fantasia M.D.   On: 05/02/2019 05:56    Medications:  Prior to Admission:  Medications Prior to Admission  Medication Sig Dispense Refill Last Dose  . acetaminophen (TYLENOL) 325 MG tablet Take 650 mg by mouth every 6 (six) hours as needed.   Past Month at Unknown time  . alendronate (FOSAMAX) 70 MG tablet Take 1 tablet (70 mg total) by mouth once a week. Take with a full glass of water on an empty stomach. 4 tablet 0 04/28/2019  . apixaban (ELIQUIS) 5 MG TABS tablet Take 1 tablet (5 mg total) by mouth 2 (two) times daily. 60 tablet 5 05/01/2019 at 0900pm  . calcium-vitamin D (OS-CAL 500 + D) 500-200 MG-UNIT tablet Take 1 tablet by mouth 2 (two) times daily.   05/01/2019 at Unknown time  . diltiazem (CARDIZEM CD) 360 MG 24 hr capsule Take 1 capsule (360 mg total) by mouth daily. 30 capsule 5 05/01/2019 at Unknown time  . furosemide (LASIX) 40 MG tablet Take 1 tablet (40 mg total) by mouth daily. 90 tablet 3 05/01/2019 at Unknown time  . magnesium oxide (MAG-OX) 400 MG tablet Take 1 tablet (400 mg total) by mouth 3 (three) times daily. 90 tablet 0 05/01/2019 at Unknown time  . memantine (NAMENDA) 5 MG tablet Take 1 tablet (5 mg total) by mouth 2 (two) times daily. 60 tablet 0 05/01/2019 at Unknown time  . metoprolol tartrate (LOPRESSOR) 25 MG tablet Take 1 tablet (25 mg total) by mouth 2 (two) times daily. 60 tablet 5 05/01/2019 at 0900pm  . pantoprazole (PROTONIX)  40 MG tablet Take 1 tablet (40 mg total) by mouth daily.  30 tablet 3 05/01/2019 at Unknown time  . Polyethyl Glycol-Propyl Glycol (SYSTANE OP) Place 1 drop into both eyes daily as needed. Dry Eyes    unknown  . QUEtiapine (SEROQUEL) 25 MG tablet Take 1 tablet (25 mg total) by mouth at bedtime. 30 tablet 0 05/01/2019 at Unknown time  . sertraline (ZOLOFT) 50 MG tablet Take 1 tablet (50 mg total) by mouth every morning. 30 tablet 0 05/01/2019 at Unknown time  . mirtazapine (REMERON) 7.5 MG tablet Take 1 tablet (7.5 mg total) by mouth at bedtime. (Patient not taking: Reported on 05/02/2019) 30 tablet 3 Not Taking at Unknown time  . NON FORMULARY Diet Type: NAS     . Ostomy Supplies (SKIN PREP WIPES) MISC Apply to bilateral heels every shift for prevention      Scheduled: . apixaban  5 mg Oral BID  . calcium-vitamin D  1 tablet Oral BID  . diltiazem  360 mg Oral Daily  . feeding supplement (ENSURE ENLIVE)  237 mL Oral BID BM  . furosemide  40 mg Intravenous BID  . memantine  5 mg Oral BID  . metoprolol tartrate  25 mg Oral BID  . mirtazapine  7.5 mg Oral QHS  . multivitamin with minerals  1 tablet Oral Daily  . pantoprazole  40 mg Oral Daily  . potassium chloride  20 mEq Oral BID  . QUEtiapine  25 mg Oral QHS  . sertraline  50 mg Oral q morning - 10a  . sodium chloride flush  3 mL Intravenous Q12H   Continuous: . sodium chloride     [id number] chloride, acetaminophen, ondansetron (ZOFRAN) IV, sodium chloride flush  Assesment: She was admitted with acute on chronic diastolic heart failure.  She is down about 800 cc and says she feels better but she still has significant edema.  She has chronic atrial fib which is stable  She has dementia unchanged Active Problems:   Dementia (HCC)   Atrial fibrillation (HCC)   Acute on chronic diastolic congestive heart failure (HCC)   Acute on chronic diastolic CHF (congestive heart failure) ([place])    Plan: Continue IV diuresis.  Her potassium is  3.5 so she will need replacement.  Continue oxygen.    LOS: 1 day   Kristy Mckinney 05/03/2019, 8:55 AM

## 2019-05-03 NOTE — Progress Notes (Signed)
Pt called to nursing station without stating need. Upon entering room immediately after, c/o "feeling drunk". When asked to elaborate, she states she "feels beside herself", and admits to feeling dizzy. No c/o of pain/chest pain, nausea. Alert, oriented. Assessed vitals, BP 93/50, HR 73, O2 89. Initiated O2 at 2L. O2 now 97%. MD notified. Will continue to monitor patient.

## 2019-05-04 LAB — BASIC METABOLIC PANEL
Anion gap: 10 (ref 5–15)
BUN: 25 mg/dL — ABNORMAL HIGH (ref 8–23)
CO2: 32 mmol/L (ref 22–32)
Calcium: 9.3 mg/dL (ref 8.9–10.3)
Chloride: 98 mmol/L (ref 98–111)
Creatinine, Ser: 0.92 mg/dL (ref 0.44–1.00)
GFR calc Af Amer: >60 mL/min (ref >60)
GFR calc non Af Amer: 57 mL/min — ABNORMAL LOW (ref >60)
Glucose, Bld: 119 mg/dL — ABNORMAL HIGH (ref 70–99)
Potassium: 3.6 mmol/L (ref 3.5–5.1)
Sodium: 140 mmol/L (ref 135–145)

## 2019-05-04 MED ORDER — POTASSIUM CHLORIDE CRYS ER 20 MEQ PO TBCR
40.0000 meq | EXTENDED_RELEASE_TABLET | Freq: Two times a day (BID) | ORAL | Status: DC
  Administered 2019-05-04 – 2019-05-05 (×4): 40 meq via ORAL
  Filled 2019-05-04 (×5): qty 2

## 2019-05-04 NOTE — Progress Notes (Signed)
Subjective: She says she feels better.  She has no new complaints.  She is still swelling in her legs.  Her weight has come down but she is not at baseline  Objective: Vital signs in last 24 hours: Temp:  [98.2 F (36.8 C)-98.7 F (37.1 C)] 98.2 F (36.8 C) (10/25 0450) Pulse Rate:  [71-122] 122 (10/25 0832) Resp:  [18] 18 (10/25 0450) BP: (115-140)/(56-84) 124/84 (10/25 0832) SpO2:  [84 %-100 %] 95 % (10/25 0454) Weight:  [68.9 kg] 68.9 kg (10/25 0600) Weight change: -1.043 kg Last BM Date: 05/03/19  Intake/Output from previous day: 10/24 0701 - 10/25 0700 In: 966 [P.O.:960; I.V.:6] Out: 1400 [Urine:1400]  PHYSICAL EXAM General appearance: alert, cooperative and no distress Resp: rales bilaterally Cardio: irregularly irregular rhythm GI: soft, non-tender; bowel sounds normal; no masses,  no organomegaly Extremities: extremities normal, atraumatic, no cyanosis or edema  Lab Results:  Results for orders placed or performed during the hospital encounter of 05/02/19 (from the past 48 hour(s))  Glucose, capillary     Status: Abnormal   Collection Time: 05/03/19  4:49 AM  Result Value Ref Range   Glucose-Capillary 114 (H) 70 - 99 mg/dL   Comment 1 Notify RN   Basic metabolic panel     Status: Abnormal   Collection Time: 05/03/19  6:50 AM  Result Value Ref Range   Sodium 140 135 - 145 mmol/L   Potassium 3.5 3.5 - 5.1 mmol/L   Chloride 97 (L) 98 - 111 mmol/L   CO2 31 22 - 32 mmol/L   Glucose, Bld 105 (H) 70 - 99 mg/dL   BUN 26 (H) 8 - 23 mg/dL   Creatinine, Ser 0.93 0.44 - 1.00 mg/dL   Calcium 9.3 8.9 - 10.3 mg/dL   GFR calc non Af Amer 56 (L) >60 mL/min   GFR calc Af Amer >60 >60 mL/min   Anion gap 12 5 - 15    Comment: Performed at [facility], [address] [place], [place] [id number]  Basic metabolic panel     Status: Abnormal   Collection Time: 05/04/19  6:36 AM  Result Value Ref Range   Sodium 140 135 - 145 mmol/L   Potassium 3.6 3.5 - 5.1 mmol/L   Chloride 98  98 - 111 mmol/L   CO2 32 22 - 32 mmol/L   Glucose, Bld 119 (H) 70 - 99 mg/dL   BUN 25 (H) 8 - 23 mg/dL   Creatinine, Ser 0.92 0.44 - 1.00 mg/dL   Calcium 9.3 8.9 - 10.3 mg/dL   GFR calc non Af Amer 57 (L) >60 mL/min   GFR calc Af Amer >60 >60 mL/min   Anion gap 10 5 - 15    Comment: Performed at [facility], [address] [place], [place] [address]    ABGS No results for input(s): PHART, PO2ART, TCO2, HCO3 in the last 72 hours.  Invalid input(s): PCO2 CULTURES Recent Results (from the past 240 hour(s))  SARS CORONAVIRUS 2 (TAT 6-24 HRS) Nasopharyngeal Nasopharyngeal Swab     Status: None   Collection Time: 05/02/19  5:26 AM   Specimen: Nasopharyngeal Swab  Result Value Ref Range Status   SARS Coronavirus 2 NEGATIVE NEGATIVE Final    Comment: (NOTE) SARS-CoV-2 target nucleic acids are NOT DETECTED. The SARS-CoV-2 RNA is generally detectable in upper and lower respiratory specimens during the acute phase of infection. Negative results do not preclude SARS-CoV-2 infection, do not rule out co-infections with other pathogens, and should not  be used as the sole basis for treatment or other patient management decisions. Negative results must be combined with clinical observations, patient history, and epidemiological information. The expected result is Negative. Fact Sheet for Patients: SugarRoll.be Fact Sheet for Healthcare Providers: https://www.woods-mathews.com/ This test is not yet approved or cleared by the [place] FDA and  has been authorized for detection and/or diagnosis of SARS-CoV-2 by FDA under an Emergency Use Authorization (EUA). This EUA will remain  in effect (meaning this test can be used) for the duration of the COVID-19 declaration under Section 56 4(b)(1) of the Act, 21 U.S.C. section 360bbb-3(b)(1), unless the authorization is terminated or revoked sooner. Performed at [facility], [place] [address],  [place],  [address]    Studies/Results: No results found.  Medications:  Prior to Admission:  Medications Prior to Admission  Medication Sig Dispense Refill Last Dose  . acetaminophen (TYLENOL) 325 MG tablet Take 650 mg by mouth every 6 (six) hours as needed.   Past Month at Unknown time  . alendronate (FOSAMAX) 70 MG tablet Take 1 tablet (70 mg total) by mouth once a week. Take with a full glass of water on an empty stomach. 4 tablet 0 04/28/2019  . apixaban (ELIQUIS) 5 MG TABS tablet Take 1 tablet (5 mg total) by mouth 2 (two) times daily. 60 tablet 5 05/01/2019 at 0900pm  . calcium-vitamin D (OS-CAL 500 + D) 500-200 MG-UNIT tablet Take 1 tablet by mouth 2 (two) times daily.   05/01/2019 at Unknown time  . diltiazem (CARDIZEM CD) 360 MG 24 hr capsule Take 1 capsule (360 mg total) by mouth daily. 30 capsule 5 05/01/2019 at Unknown time  . furosemide (LASIX) 40 MG tablet Take 1 tablet (40 mg total) by mouth daily. 90 tablet 3 05/01/2019 at Unknown time  . magnesium oxide (MAG-OX) 400 MG tablet Take 1 tablet (400 mg total) by mouth 3 (three) times daily. 90 tablet 0 05/01/2019 at Unknown time  . memantine (NAMENDA) 5 MG tablet Take 1 tablet (5 mg total) by mouth 2 (two) times daily. 60 tablet 0 05/01/2019 at Unknown time  . metoprolol tartrate (LOPRESSOR) 25 MG tablet Take 1 tablet (25 mg total) by mouth 2 (two) times daily. 60 tablet 5 05/01/2019 at 0900pm  . pantoprazole (PROTONIX) 40 MG tablet Take 1 tablet (40 mg total) by mouth daily. 30 tablet 3 05/01/2019 at Unknown time  . Polyethyl Glycol-Propyl Glycol (SYSTANE OP) Place 1 drop into both eyes daily as needed. Dry Eyes    unknown  . QUEtiapine (SEROQUEL) 25 MG tablet Take 1 tablet (25 mg total) by mouth at bedtime. 30 tablet 0 05/01/2019 at Unknown time  . sertraline (ZOLOFT) 50 MG tablet Take 1 tablet (50 mg total) by mouth every morning. 30 tablet 0 05/01/2019 at Unknown time  . mirtazapine (REMERON) 7.5 MG tablet Take 1 tablet (7.5  mg total) by mouth at bedtime. (Patient not taking: Reported on 05/02/2019) 30 tablet 3 Not Taking at Unknown time  . NON FORMULARY Diet Type: NAS     . Ostomy Supplies (SKIN PREP WIPES) MISC Apply to bilateral heels every shift for prevention      Scheduled: . apixaban  5 mg Oral BID  . calcium-vitamin D  1 tablet Oral BID  . diltiazem  360 mg Oral Daily  . feeding supplement (ENSURE ENLIVE)  237 mL Oral BID BM  . furosemide  40 mg Intravenous BID  . memantine  5 mg Oral BID  .  metoprolol tartrate  25 mg Oral BID  . mirtazapine  7.5 mg Oral QHS  . multivitamin with minerals  1 tablet Oral Daily  . pantoprazole  40 mg Oral Daily  . potassium chloride  40 mEq Oral BID  . QUEtiapine  25 mg Oral QHS  . sertraline  50 mg Oral q morning - 10a  . sodium chloride flush  3 mL Intravenous Q12H   Continuous: . sodium chloride     [id number] chloride, acetaminophen, ondansetron (ZOFRAN) IV, sodium chloride flush, zolpidem  Assesment: She was admitted with acute on chronic diastolic heart failure.  She is better but not at baseline  She has chronic atrial fib and that is stable  She has dementia unchanged Active Problems:   Dementia (HCC)   Atrial fibrillation (HCC)   Acute on chronic diastolic congestive heart failure (HCC)   Acute on chronic diastolic CHF (congestive heart failure) ([place])    Plan: Continue current treatments.  Discussed with daughter by phone    LOS: 2 days   Alonza Bogus 05/04/2019, 8:43 AM

## 2019-05-05 DIAGNOSIS — Z7189 Other specified counseling: Secondary | ICD-10-CM

## 2019-05-05 DIAGNOSIS — Z515 Encounter for palliative care: Secondary | ICD-10-CM

## 2019-05-05 LAB — BASIC METABOLIC PANEL
Anion gap: 10 (ref 5–15)
BUN: 24 mg/dL — ABNORMAL HIGH (ref 8–23)
CO2: 31 mmol/L (ref 22–32)
Calcium: 9.5 mg/dL (ref 8.9–10.3)
Chloride: 99 mmol/L (ref 98–111)
Creatinine, Ser: 0.89 mg/dL (ref 0.44–1.00)
GFR calc Af Amer: >60 mL/min (ref >60)
GFR calc non Af Amer: 60 mL/min — ABNORMAL LOW (ref >60)
Glucose, Bld: 126 mg/dL — ABNORMAL HIGH (ref 70–99)
Potassium: 4.1 mmol/L (ref 3.5–5.1)
Sodium: 140 mmol/L (ref 135–145)

## 2019-05-05 MED ORDER — ZOLPIDEM TARTRATE 5 MG PO TABS
5.0000 mg | ORAL_TABLET | Freq: Every evening | ORAL | Status: DC | PRN
  Administered 2019-05-06 – 2019-05-09 (×2): 5 mg via ORAL
  Filled 2019-05-05 (×2): qty 1

## 2019-05-05 NOTE — Care Management Important Message (Signed)
Important Message  Patient Details  Name: Kristy Mckinney MRN: [id number] Date of Birth: May 25, 1932   Medicare Important Message Given:  Yes     Tommy Medal 05/05/2019, 1:25 PM

## 2019-05-05 NOTE — Consult Note (Signed)
Consultation Note Date: 05/05/2019   Patient Name: Kristy Mckinney  DOB: [birth year]  MRN: [id number]  Age / Sex: 85 y.o., female  PCP: [name], MD Referring Physician: [name], MD  Reason for Consultation: Establishing goals of care  HPI/Patient Profile: 85 y.o. female  with past medical history of a fib, chronic back pain, GERD, hip fracture s/p surgical repair,  admitted on 05/02/2019 with CHF exacerbation. Per her daughter, Kristy Mckinney, she was at [facility] SNF for rehab and was not given her lasix, and developed fluid overload. Palliative medicine consulted for [place].    Clinical Assessment and Goals of Care: Patient and daughter med with Ihor Dow, NP on 04/17/19 during last admission- per that visit- MOST form was completed and is on patient's chart indicating Full Code status - "very brief- try a time or two", Full Scope of medical treatment, Antibiotics if life prolonging, IV fluids for defined trial period, and No feeding tube.   I met with patient- she was sitting up in her chair and in good spirits. Not visibly SOB. Enjoying the nursing students who were assisting her. We discussed her health status and ongoing [place]. She tells me she is aware that she has heart failure and "she doesn't like it". She states she isn't ready to die- she wants to enjoy her grandchildren and great grand children. We discussed the trajectory of her illness, and she states that as long as she can visit with her family- that is what she considers quality of life. I reviewed her MOST form that is on file with her and she maintained all of previously chosen option.   I also called her daughter Kristy Mckinney. [name] reviewed the last several weeks- "that had been a Aeronautical engineer". Kristy Mckinney feels that her mother would not have returned back into the hospital if her lasix had been maintained and if she had not been allowed to gain  so much fluid at the SNF. Kristy Mckinney agrees with [employer] stated [place].   I did discuss with Kristy Mckinney future anticipated care decisions- Kristy Mckinney is hopeful for patient to come home with home health and PT. We discussed if patient were to decline again- would they want to place patient in hospital or readmit to SNF if needed? We discussed [name]'s concerns re:  implications of patient's declining in SNF's due to being restricted from family visits vs her Mom potentially needing 24 hr care in the future. We discussed alternative options of comfort care, Hospice.   Our discussion was cut short as Kristy Mckinney received another phone call she needed to take.   Primary Decision Maker PATIENT    SUMMARY OF RECOMMENDATIONS -Continue full scope care -Will f/u with [facility] tomorrow re: recommend outpatient Palliative f/u at home    Code Status/Advance Care Planning:  Full code  Prognosis:    Unable to determine  Discharge Planning: Home with Home Health  Primary Diagnoses: Present on Admission: . Acute on chronic diastolic congestive heart failure ([place]) . Acute on chronic diastolic CHF (congestive heart failure) ([place]) .  Dementia ([place]) . Atrial fibrillation ([place])   I have reviewed the medical record, interviewed the patient and family, and examined the patient. The following aspects are pertinent.  Past Medical History:  Diagnosis Date  . Atrial fibrillation ([place])   . Chronic back pain   . Diarrhea   . Dysphagia 05/03/2016  . GERD (gastroesophageal reflux disease)    Social History   Socioeconomic History  . Marital status: Widowed    Spouse name: Not on file  . Number of children: Not on file  . Years of education: Not on file  . Highest education level: Not on file  Occupational History  . Not on file  Social Needs  . Financial resource strain: Not hard at all  . Food insecurity    Worry: Never true    Inability: Never true  . Transportation needs    Medical: No    Non-medical: No   Tobacco Use  . Smoking status: Never Smoker  . Smokeless tobacco: Never Used  Substance and Sexual Activity  . Alcohol use: No  . Drug use: No  . Sexual activity: Yes    Birth control/protection: Surgical  Lifestyle  . Physical activity    Days per week: 5 days    Minutes per session: 30 min  . Stress: Not at all  Relationships  . Social connections    Talks on phone: More than three times a week    Gets together: More than three times a week    Attends religious service: More than 4 times per year    Active member of club or organization: Yes    Attends meetings of clubs or organizations: More than 4 times per year    Relationship status: Widowed  Other Topics Concern  . Not on file  Social History Narrative   Pt lives in an apartment complex, reports that she still drives   Family History  Problem Relation Age of Onset  . Hypertension Child    Scheduled Meds: . apixaban  5 mg Oral BID  . calcium-vitamin D  1 tablet Oral BID  . diltiazem  360 mg Oral Daily  . feeding supplement (ENSURE ENLIVE)  237 mL Oral BID BM  . furosemide  40 mg Intravenous BID  . memantine  5 mg Oral BID  . metoprolol tartrate  25 mg Oral BID  . mirtazapine  7.5 mg Oral QHS  . multivitamin with minerals  1 tablet Oral Daily  . pantoprazole  40 mg Oral Daily  . potassium chloride  40 mEq Oral BID  . QUEtiapine  25 mg Oral QHS  . sertraline  50 mg Oral q morning - 10a  . sodium chloride flush  3 mL Intravenous Q12H   Continuous Infusions: . sodium chloride     PRN Meds:.sodium chloride, acetaminophen, ondansetron (ZOFRAN) IV, sodium chloride flush Medications Prior to Admission:  Prior to Admission medications   Medication Sig Start Date End Date Taking? Authorizing Provider  acetaminophen (TYLENOL) 325 MG tablet Take 650 mg by mouth every 6 (six) hours as needed. 12/05/18  Yes [provider]  alendronate (FOSAMAX) 70 MG tablet Take 1 tablet (70 mg total) by mouth once a week. Take  with a full glass of water on an empty stomach. 12/23/18  Yes Gerlene Fee, NP  apixaban (ELIQUIS) 5 MG TABS tablet Take 1 tablet (5 mg total) by mouth 2 (two) times daily. 04/07/19  Yes [name], MD  calcium-vitamin D (OS-CAL 500 +  D) 500-200 MG-UNIT tablet Take 1 tablet by mouth 2 (two) times daily. 12/05/18  Yes Oretha Milch D, MD  diltiazem (CARDIZEM CD) 360 MG 24 hr capsule Take 1 capsule (360 mg total) by mouth daily. 04/07/19  Yes [name], MD  furosemide (LASIX) 40 MG tablet Take 1 tablet (40 mg total) by mouth daily. 05/01/19 07/30/19 Yes Reece, Fransisco Hertz, PA-C  magnesium oxide (MAG-OX) 400 MG tablet Take 1 tablet (400 mg total) by mouth 3 (three) times daily. 12/23/18  Yes Gerlene Fee, NP  memantine (NAMENDA) 5 MG tablet Take 1 tablet (5 mg total) by mouth 2 (two) times daily. 12/23/18  Yes Gerlene Fee, NP  metoprolol tartrate (LOPRESSOR) 25 MG tablet Take 1 tablet (25 mg total) by mouth 2 (two) times daily. 04/08/19  Yes [name], MD  pantoprazole (PROTONIX) 40 MG tablet Take 1 tablet (40 mg total) by mouth daily. 04/19/19  Yes Roxan Hockey, MD  Polyethyl Glycol-Propyl Glycol (SYSTANE OP) Place 1 drop into both eyes daily as needed. Dry Eyes    Yes [provider]  QUEtiapine (SEROQUEL) 25 MG tablet Take 1 tablet (25 mg total) by mouth at bedtime. 12/23/18  Yes Gerlene Fee, NP  sertraline (ZOLOFT) 50 MG tablet Take 1 tablet (50 mg total) by mouth every morning. 12/23/18  Yes Gerlene Fee, NP  mirtazapine (REMERON) 7.5 MG tablet Take 1 tablet (7.5 mg total) by mouth at bedtime. Patient not taking: Reported on 05/02/2019 04/19/19 04/18/20  Roxan Hockey, MD  NON FORMULARY Diet Type: NAS 12/05/18   [provider]  Ostomy Supplies (SKIN PREP WIPES) MISC Apply to bilateral heels every shift for prevention 12/06/18   [provider]   Allergies  Allergen Reactions  . Penicillins Anaphylaxis  . Emetrol Nausea Only     swelling  . Feldene [Piroxicam] Nausea And Vomiting  . Sulfa Antibiotics     swelling   Physical Exam Vitals signs and nursing note reviewed.  Constitutional:      Appearance: She is well-developed.  HENT:     Head: Normocephalic and atraumatic.  Pulmonary:     Effort: Pulmonary effort is normal.  Skin:    General: Skin is warm and dry.  Neurological:     Mental Status: She is alert and oriented to person, place, and time.     Vital Signs: BP 125/76 (BP Location: Right Arm)   Pulse 86   Temp 98.2 F (36.8 C) (Oral)   Resp 19   Ht 5' 4.5" (1.638 m)   Wt 68.9 kg   SpO2 96%   BMI 25.69 kg/m  Pain Scale: 0-10   Pain Score: 5    SpO2: SpO2: 96 % O2 Device:SpO2: 96 % O2 Flow Rate: .O2 Flow Rate (L/min): 2 L/min  IO: Intake/output summary:   Intake/Output Summary (Last 24 hours) at 05/05/2019 1246 Last data filed at 05/05/2019 0500 Gross per 24 hour  Intake 240 ml  Output 700 ml  Net -460 ml    LBM: Last BM Date: 05/05/19 Baseline Weight: Weight: 73 kg Most recent weight: Weight: 68.9 kg     Palliative Assessment/Data: PPS: 40%     Thank you for this consult. Palliative medicine will continue to follow and assist as needed.   Time In: 1130 Time Out: 1245 Time Total: 75 minutes Greater than 50%  of this time was spent counseling and coordinating care related to the above assessment and plan.  Signed by: Mariana Kaufman, AGNP-C Palliative  Medicine    Please contact Palliative Medicine Team phone at [phone] for questions and concerns.  For individual provider: See Shea Evans

## 2019-05-05 NOTE — Plan of Care (Signed)
  Problem: Acute Rehab PT Goals(only PT should resolve) Goal: Pt Will Go Supine/Side To Sit Flowsheets (Taken 05/05/2019 1445) Pt will go Supine/Side to Sit: Independently Goal: Patient Will Transfer Sit To/From Stand Flowsheets (Taken 05/05/2019 1445) Patient will transfer sit to/from stand: with modified independence Goal: Pt Will Transfer Bed To Chair/Chair To Bed Flowsheets (Taken 05/05/2019 1445) Pt will Transfer Bed to Chair/Chair to Bed: with modified independence Goal: Pt Will Ambulate Flowsheets (Taken 05/05/2019 1445) Pt will Ambulate:  75 feet  with modified independence  with rolling walker   2:46 PM, 05/05/19 Lonell Grandchild, MPT Physical Therapist with [facility] [phone] office [phone] mobile phone

## 2019-05-05 NOTE — Progress Notes (Signed)
Subjective: She had another episode of shortness of breath last night.  She is better this morning.  No other new complaints.  No chest pain.  Objective: Vital signs in last 24 hours: Temp:  [98.3 F (36.8 C)-99.2 F (37.3 C)] 98.3 F (36.8 C) (10/26 0543) Pulse Rate:  [65-103] 103 (10/26 0543) Resp:  [17-19] 19 (10/26 0543) BP: (105-149)/(59-86) 149/86 (10/26 0543) SpO2:  [93 %-98 %] 94 % (10/26 0543) Weight change:  Last BM Date: 05/03/19  Intake/Output from previous day: 10/25 0701 - 10/26 0700 In: 240 [P.O.:240] Out: 1000 [Urine:1000]  PHYSICAL EXAM General appearance: alert, cooperative and no distress Resp: She still has bilateral rales but it is better Cardio: irregularly irregular rhythm GI: soft, non-tender; bowel sounds normal; no masses,  no organomegaly Extremities: Still edema of both legs but it is better  Lab Results:  Results for orders placed or performed during the hospital encounter of 05/02/19 (from the past 48 hour(s))  Basic metabolic panel     Status: Abnormal   Collection Time: 05/04/19  6:36 AM  Result Value Ref Range   Sodium 140 135 - 145 mmol/L   Potassium 3.6 3.5 - 5.1 mmol/L   Chloride 98 98 - 111 mmol/L   CO2 32 22 - 32 mmol/L   Glucose, Bld 119 (H) 70 - 99 mg/dL   BUN 25 (H) 8 - 23 mg/dL   Creatinine, Ser 0.92 0.44 - 1.00 mg/dL   Calcium 9.3 8.9 - 10.3 mg/dL   GFR calc non Af Amer 57 (L) >60 mL/min   GFR calc Af Amer >60 >60 mL/min   Anion gap 10 5 - 15    Comment: Performed at [facility], [address], [place], [place] [id number]  Basic metabolic panel     Status: Abnormal   Collection Time: 05/05/19  6:56 AM  Result Value Ref Range   Sodium 140 135 - 145 mmol/L   Potassium 4.1 3.5 - 5.1 mmol/L   Chloride 99 98 - 111 mmol/L   CO2 31 22 - 32 mmol/L   Glucose, Bld 126 (H) 70 - 99 mg/dL   BUN 24 (H) 8 - 23 mg/dL   Creatinine, Ser 0.89 0.44 - 1.00 mg/dL   Calcium 9.5 8.9 - 10.3 mg/dL   GFR calc non Af Amer 60 (L) >60 mL/min   GFR calc Af Amer >60 >60 mL/min   Anion gap 10 5 - 15    Comment: Performed at [facility], [address] [place], [place] [address]    ABGS No results for input(s): PHART, PO2ART, TCO2, HCO3 in the last 72 hours.  Invalid input(s): PCO2 CULTURES Recent Results (from the past 240 hour(s))  SARS CORONAVIRUS 2 (TAT 6-24 HRS) Nasopharyngeal Nasopharyngeal Swab     Status: None   Collection Time: 05/02/19  5:26 AM   Specimen: Nasopharyngeal Swab  Result Value Ref Range Status   SARS Coronavirus 2 NEGATIVE NEGATIVE Final    Comment: (NOTE) SARS-CoV-2 target nucleic acids are NOT DETECTED. The SARS-CoV-2 RNA is generally detectable in upper and lower respiratory specimens during the acute phase of infection. Negative results do not preclude SARS-CoV-2 infection, do not rule out co-infections with other pathogens, and should not be used as the sole basis for treatment or other patient management decisions. Negative results must be combined with clinical observations, patient history, and epidemiological information. The expected result is Negative. Fact Sheet for Patients: SugarRoll.be Fact Sheet for Healthcare Providers: https://www.woods-mathews.com/ This test is not yet  approved or cleared by the [place] and  has been authorized for detection and/or diagnosis of SARS-CoV-2 by FDA under an Emergency Use Authorization (EUA). This EUA will remain  in effect (meaning this test can be used) for the duration of the COVID-19 declaration under Section 56 4(b)(1) of the Act, 21 U.S.C. section 360bbb-3(b)(1), unless the authorization is terminated or revoked sooner. Performed at [facility], [place] [address] [place], [place] [address]    Studies/Results: No results found.  Medications:  Prior to Admission:  Medications Prior to Admission  Medication Sig Dispense Refill Last Dose  . acetaminophen (TYLENOL) 325 MG tablet Take  650 mg by mouth every 6 (six) hours as needed.   Past Month at Unknown time  . alendronate (FOSAMAX) 70 MG tablet Take 1 tablet (70 mg total) by mouth once a week. Take with a full glass of water on an empty stomach. 4 tablet 0 04/28/2019  . apixaban (ELIQUIS) 5 MG TABS tablet Take 1 tablet (5 mg total) by mouth 2 (two) times daily. 60 tablet 5 05/01/2019 at 0900pm  . calcium-vitamin D (OS-CAL 500 + D) 500-200 MG-UNIT tablet Take 1 tablet by mouth 2 (two) times daily.   05/01/2019 at Unknown time  . diltiazem (CARDIZEM CD) 360 MG 24 hr capsule Take 1 capsule (360 mg total) by mouth daily. 30 capsule 5 05/01/2019 at Unknown time  . furosemide (LASIX) 40 MG tablet Take 1 tablet (40 mg total) by mouth daily. 90 tablet 3 05/01/2019 at Unknown time  . magnesium oxide (MAG-OX) 400 MG tablet Take 1 tablet (400 mg total) by mouth 3 (three) times daily. 90 tablet 0 05/01/2019 at Unknown time  . memantine (NAMENDA) 5 MG tablet Take 1 tablet (5 mg total) by mouth 2 (two) times daily. 60 tablet 0 05/01/2019 at Unknown time  . metoprolol tartrate (LOPRESSOR) 25 MG tablet Take 1 tablet (25 mg total) by mouth 2 (two) times daily. 60 tablet 5 05/01/2019 at 0900pm  . pantoprazole (PROTONIX) 40 MG tablet Take 1 tablet (40 mg total) by mouth daily. 30 tablet 3 05/01/2019 at Unknown time  . Polyethyl Glycol-Propyl Glycol (SYSTANE OP) Place 1 drop into both eyes daily as needed. Dry Eyes    unknown  . QUEtiapine (SEROQUEL) 25 MG tablet Take 1 tablet (25 mg total) by mouth at bedtime. 30 tablet 0 05/01/2019 at Unknown time  . sertraline (ZOLOFT) 50 MG tablet Take 1 tablet (50 mg total) by mouth every morning. 30 tablet 0 05/01/2019 at Unknown time  . mirtazapine (REMERON) 7.5 MG tablet Take 1 tablet (7.5 mg total) by mouth at bedtime. (Patient not taking: Reported on 05/02/2019) 30 tablet 3 Not Taking at Unknown time  . NON FORMULARY Diet Type: NAS     . Ostomy Supplies (SKIN PREP WIPES) MISC Apply to bilateral heels every  shift for prevention      Scheduled: . apixaban  5 mg Oral BID  . calcium-vitamin D  1 tablet Oral BID  . diltiazem  360 mg Oral Daily  . feeding supplement (ENSURE ENLIVE)  237 mL Oral BID BM  . furosemide  40 mg Intravenous BID  . memantine  5 mg Oral BID  . metoprolol tartrate  25 mg Oral BID  . mirtazapine  7.5 mg Oral QHS  . multivitamin with minerals  1 tablet Oral Daily  . pantoprazole  40 mg Oral Daily  . potassium chloride  40 mEq Oral BID  . QUEtiapine  25 mg Oral QHS  . sertraline  50 mg Oral q morning - 10a  . sodium chloride flush  3 mL Intravenous Q12H   Continuous: . sodium chloride     [id number] chloride, acetaminophen, ondansetron (ZOFRAN) IV, sodium chloride flush  Assesment: She was admitted with acute on chronic diastolic heart failure.  She is better but still not ready for discharge or even to switch her to oral diuresis.  She has no new complaints.  She has chronic atrial fib and she is chronically anticoagulated  She has dementia which is doing okay Active Problems:   Dementia (HCC)   Atrial fibrillation (HCC)   Acute on chronic diastolic congestive heart failure (HCC)   Acute on chronic diastolic CHF (congestive heart failure) ([place])    Plan: Continue current treatments.  Palliative care consult has been requested and she is interested in exploring that.    LOS: 3 days   Alonza Bogus 05/05/2019, 8:45 AM

## 2019-05-05 NOTE — TOC Progression Note (Signed)
Transition of Care [facility]) - Progression Note    Patient Details  Name: Kristy Mckinney MRN: [id number] Date of Birth: 1932-04-27  Transition of Care [facility]) CM/SW Contact  Shade Flood, LCSW Phone Number: 05/05/2019, 11:38 AM  Clinical Narrative:     TOC following. Reviewed pt's record this AM. Pt still requiring IV lasix. DC timeframe not yet known. PT recommending HH PT at dc. Pt had been previously referred to [place] for therapies and RN. Update given to [place] at [place] today.   Pt may need O2 at dc. Palliative consulted. Spoke with pt's daughter by phone today to update. Daughter states that they will have someone with pt 24/7 at dc. TOC will follow and assist as needed.   Expected Discharge Plan: [place] Barriers to Discharge: Continued Medical Work up  Expected Discharge Plan and Services Expected Discharge Plan: [place] In-house Referral: Clinical Social Work     Living arrangements for the past 2 months: [place]: [place] ([place]) Date [place]: 05/05/19 Time [place]: 1138 Representative spoke with at [place]: Leda Quail   Social Determinants of Health ([place]) Interventions    Readmission Risk Interventions Readmission Risk Prevention Plan 05/05/2019 05/02/2019 11/27/2018  Post Dischage Appt - - Not Complete  Appt Comments - - cardiologist will call patient for appt.   Medication Screening - - Complete  Transportation Screening - Complete Complete  Medication Review Press photographer) - Complete -  [place] or Home Care Consult Complete - -  SW Recovery Care/Counseling Consult Complete - -  Palliative Care Screening Complete - -  [place] Not Applicable - -  Some recent data might be hidden

## 2019-05-05 NOTE — Evaluation (Signed)
Physical Therapy Evaluation Patient Details Name: Kristy Mckinney MRN: [id number] DOB: 1932-01-26 Today's Date: 05/05/2019   History of Present Illness  Kristy Mckinney is a 85 year old female with a history of permanent atrial fibrillation, anxiety, diastolic CHF, GERD presenting with 1 week history of shortness of breath.  Patient was recently admitted to the hospital from 04/10/2019 through 04/19/2019 for acute kidney injury secondary to gastroenteritis.  She was discharged from to [facility] where she was not initially restarted on her furosemide.  Her daughter contacted her cardiologist, and the patient received 3 days of furosemide at the facility after which she did not receive any further furosemide.  In the interim, the patient had noted increasing lower extremity edema, orthopnea, and dyspnea on exertion.  The patient had a visit with her cardiology office on 05/01/2019.  Admission was offered, but the patient did not want to be admitted at that time.  Unfortunately, she developed worsening shortness of breath resulting in her ED visit.  She denies any fevers, chills, chest pain, headache, coughing, hemoptysis, nausea, vomiting, diarrhea, abdominal pain.  Notably, the patient also had an admission earlier this calendar year from 11/28/2018 through 12/05/2018 during which she sustained a nondisplaced right intertrochanteric femur fracture requiring repair.In the ED, the patient was afebrile hemodynamically stable with oxygen saturation of 95% room air.  She was started on IV furosemide.  BMP and CBC were essentially unremarkable.  BNP was 473.  Chest x-ray showed bilateral pleural effusions and vascular congestion.    Clinical Impression  Patient functioning near baseline for functional mobility and gait, has to lean on nearby objects for support when not using AD, demonstrates good return for transferring to commode in bathroom and washing hands while standing in front of sink, mostly limited for  ambulation due to c/o SOB with SpO2 dropping from 91% to 84% while on room air and put back on 2 LPM after sitting in chair with SpO2 increasing to 92%.  Patient will benefit from continued physical therapy in hospital and recommended venue below to increase strength, balance, endurance for safe ADLs and gait.     Follow Up Recommendations Home health PT;Supervision - Intermittent    Equipment Recommendations  None recommended by PT    Recommendations for Other Services       Precautions / Restrictions Precautions Precautions: Fall Restrictions Weight Bearing Restrictions: No      Mobility  Bed Mobility Overal bed mobility: Modified Independent             General bed mobility comments: increased time, slightly labored movement  Transfers Overall transfer level: Needs assistance Equipment used: Rolling walker (2 wheeled);1 person hand held assist Transfers: Sit to/from [facility] Sit to Stand: Supervision Stand pivot transfers: Supervision;Min guard       General transfer comment: slightly labored movement having to lean on near by objects for support when not using AD  Ambulation/Gait Ambulation/Gait assistance: Supervision;Min guard Gait Distance (Feet): 45 Feet Assistive device: Rolling walker (2 wheeled) Gait Pattern/deviations: Decreased step length - right;Decreased step length - left;Decreased stride length Gait velocity: decreased   General Gait Details: slightly labored cadence with tendency to lean no nearby objects when not using AD, safer using RW without loss of balance, limited mostly due to c/o SOB with SpO2 dropping from 91% to 84% while on room air  Stairs            Wheelchair Mobility    Modified Rankin (Stroke Patients Only)  Balance Overall balance assessment: Needs assistance Sitting-balance support: Feet supported;No upper extremity supported Sitting balance-Leahy Scale: Good Sitting balance - Comments:  seated at EOB   Standing balance support: During functional activity;No upper extremity supported Standing balance-Leahy Scale: Fair Standing balance comment: fair/good using RW                             Pertinent Vitals/Pain Pain Assessment: No/denies pain    Home Living Family/patient expects to be discharged to:: Private residence Living Arrangements: Alone Available Help at Discharge: Family;Available PRN/intermittently Type of Home: Apartment Home Access: Level entry     Home Layout: One level Home Equipment: Cane - single point;Shower seat;Bedside commode;Walker - 2 wheels      Prior Function Level of Independence: Independent with assistive device(s)         Comments: household and short distanced community ambulator with RW/SPC PRN     Hand Dominance   Dominant Hand: Right    Extremity/Trunk Assessment   Upper Extremity Assessment Upper Extremity Assessment: Overall WFL for tasks assessed    Lower Extremity Assessment Lower Extremity Assessment: Generalized weakness    Cervical / Trunk Assessment Cervical / Trunk Assessment: Normal  Communication   Communication: No difficulties  Cognition Arousal/Alertness: Awake/alert Behavior During Therapy: WFL for tasks assessed/performed Overall Cognitive Status: Within Functional Limits for tasks assessed                                        General Comments      Exercises     Assessment/Plan    PT Assessment Patient needs continued PT services  PT Problem List Decreased strength;Decreased activity tolerance;Decreased balance;Decreased mobility       PT Treatment Interventions Balance training;Gait training;Stair training;Functional mobility training;Therapeutic activities;Therapeutic exercise;Patient/family education    PT Goals (Current goals can be found in the Care Plan section)  Acute Rehab PT Goals Patient Stated Goal: return home PT Goal Formulation: With  patient/family Time For Goal Achievement: 05/08/19 Potential to Achieve Goals: Good    Frequency Min 3X/week   Barriers to discharge        Co-evaluation               AM-PAC PT "6 Clicks" Mobility  Outcome Measure Help needed turning from your back to your side while in a flat bed without using bedrails?: None Help needed moving from lying on your back to sitting on the side of a flat bed without using bedrails?: None Help needed moving to and from a bed to a chair (including a wheelchair)?: A Little Help needed standing up from a chair using your arms (e.g., wheelchair or bedside chair)?: None Help needed to walk in hospital room?: A Little Help needed climbing 3-5 steps with a railing? : A Little 6 Click Score: 21    End of Session   Activity Tolerance: Patient tolerated treatment well;Patient limited by fatigue Patient left: in chair;with call bell/phone within reach Nurse Communication: Mobility status PT Visit Diagnosis: Unsteadiness on feet (R26.81);Other abnormalities of gait and mobility (R26.89);Muscle weakness (generalized) (M62.81)    Time: [id number] PT Time Calculation (min) (ACUTE ONLY): 21 min   Charges:   PT Evaluation $PT Eval Moderate Complexity: 1 Mod PT Treatments $Therapeutic Activity: 8-22 mins        2:42 PM, 05/05/19 Lonell Grandchild, MPT Physical Therapist with  [facility] 336 [phone] office [phone] mobile phone

## 2019-05-06 ENCOUNTER — Telehealth: Payer: PPO | Admitting: Student

## 2019-05-06 LAB — BASIC METABOLIC PANEL
Anion gap: 9 (ref 5–15)
BUN: 29 mg/dL — ABNORMAL HIGH (ref 8–23)
CO2: 31 mmol/L (ref 22–32)
Calcium: 9.4 mg/dL (ref 8.9–10.3)
Chloride: 98 mmol/L (ref 98–111)
Creatinine, Ser: 0.96 mg/dL (ref 0.44–1.00)
GFR calc Af Amer: >60 mL/min (ref >60)
GFR calc non Af Amer: 54 mL/min — ABNORMAL LOW (ref >60)
Glucose, Bld: 119 mg/dL — ABNORMAL HIGH (ref 70–99)
Potassium: 4.5 mmol/L (ref 3.5–5.1)
Sodium: 138 mmol/L (ref 135–145)

## 2019-05-06 MED ORDER — TORSEMIDE 20 MG PO TABS: 20.0000 mg | ORAL_TABLET | Freq: Two times a day (BID) | ORAL | Status: DC

## 2019-05-06 MED ORDER — FUROSEMIDE 10 MG/ML IJ SOLN
60.0000 mg | Freq: Two times a day (BID) | INTRAMUSCULAR | Status: DC
  Administered 2019-05-06 – 2019-05-08 (×6): 60 mg via INTRAVENOUS
  Filled 2019-05-06 (×5): qty 6

## 2019-05-06 MED ORDER — TORSEMIDE 20 MG PO TABS: 20.0000 mg | ORAL_TABLET | Freq: Every day | ORAL | Status: DC

## 2019-05-06 MED ORDER — POTASSIUM CHLORIDE CRYS ER 20 MEQ PO TBCR
20.0000 meq | EXTENDED_RELEASE_TABLET | Freq: Two times a day (BID) | ORAL | Status: DC
  Administered 2019-05-06 – 2019-05-07 (×4): 20 meq via ORAL
  Filled 2019-05-06 (×3): qty 1

## 2019-05-06 NOTE — Plan of Care (Signed)

## 2019-05-06 NOTE — Progress Notes (Signed)
Subjective: She is a little confused this morning.  She is down 2 kg but still above her baseline weight.  She has no new complaints.  Objective: Vital signs in last 24 hours: Temp:  [98.1 F (36.7 C)-98.4 F (36.9 C)] 98.1 F (36.7 C) (10/26 2112) Pulse Rate:  [72-103] 90 (10/27 0532) Resp:  [15-19] 18 (10/27 0532) BP: (100-128)/(60-84) 128/75 (10/27 0532) SpO2:  [95 %-100 %] 95 % (10/27 0532) Weight:  [66.9 kg] 66.9 kg (10/27 0500) Weight change:  Last BM Date: 05/05/19  Intake/Output from previous day: 10/26 0701 - 10/27 0700 In: 3 [I.V.:3] Out: 1350 [Urine:1350]  PHYSICAL EXAM General appearance: alert, mild distress and Mildly confused Resp: rales bilaterally Cardio: irregularly irregular rhythm GI: soft, non-tender; bowel sounds normal; no masses,  no organomegaly Extremities: She still has pitting edema of both legs but it is better  Lab Results:  Results for orders placed or performed during the hospital encounter of 05/02/19 (from the past 48 hour(s))  Basic metabolic panel     Status: Abnormal   Collection Time: 05/05/19  6:56 AM  Result Value Ref Range   Sodium 140 135 - 145 mmol/L   Potassium 4.1 3.5 - 5.1 mmol/L   Chloride 99 98 - 111 mmol/L   CO2 31 22 - 32 mmol/L   Glucose, Bld 126 (H) 70 - 99 mg/dL   BUN 24 (H) 8 - 23 mg/dL   Creatinine, Ser 0.89 0.44 - 1.00 mg/dL   Calcium 9.5 8.9 - 10.3 mg/dL   GFR calc non Af Amer 60 (L) >60 mL/min   GFR calc Af Amer >60 >60 mL/min   Anion gap 10 5 - 15    Comment: Performed at [facility], [address] [place], [place] [id number]  Basic metabolic panel     Status: Abnormal   Collection Time: 05/06/19  6:28 AM  Result Value Ref Range   Sodium 138 135 - 145 mmol/L   Potassium 4.5 3.5 - 5.1 mmol/L   Chloride 98 98 - 111 mmol/L   CO2 31 22 - 32 mmol/L   Glucose, Bld 119 (H) 70 - 99 mg/dL   BUN 29 (H) 8 - 23 mg/dL   Creatinine, Ser 0.96 0.44 - 1.00 mg/dL   Calcium 9.4 8.9 - 10.3 mg/dL   GFR calc non Af Amer  54 (L) >60 mL/min   GFR calc Af Amer >60 >60 mL/min   Anion gap 9 5 - 15    Comment: Performed at [facility], [address] [place], [place] [address]    ABGS No results for input(s): PHART, PO2ART, TCO2, HCO3 in the last 72 hours.  Invalid input(s): PCO2 CULTURES Recent Results (from the past 240 hour(s))  SARS CORONAVIRUS 2 (TAT 6-24 HRS) Nasopharyngeal Nasopharyngeal Swab     Status: None   Collection Time: 05/02/19  5:26 AM   Specimen: Nasopharyngeal Swab  Result Value Ref Range Status   SARS Coronavirus 2 NEGATIVE NEGATIVE Final    Comment: (NOTE) SARS-CoV-2 target nucleic acids are NOT DETECTED. The SARS-CoV-2 RNA is generally detectable in upper and lower respiratory specimens during the acute phase of infection. Negative results do not preclude SARS-CoV-2 infection, do not rule out co-infections with other pathogens, and should not be used as the sole basis for treatment or other patient management decisions. Negative results must be combined with clinical observations, patient history, and epidemiological information. The expected result is Negative. Fact Sheet for Patients: SugarRoll.be Fact Sheet for Healthcare Providers:  https://www.woods-mathews.com/ This test is not yet approved or cleared by the [place] and  has been authorized for detection and/or diagnosis of SARS-CoV-2 by FDA under an Emergency Use Authorization (EUA). This EUA will remain  in effect (meaning this test can be used) for the duration of the COVID-19 declaration under Section 56 4(b)(1) of the Act, 21 U.S.C. section 360bbb-3(b)(1), unless the authorization is terminated or revoked sooner. Performed at [facility], [place] [address] [place], [place] [address]    Studies/Results: No results found.  Medications:  Prior to Admission:  Medications Prior to Admission  Medication Sig Dispense Refill Last Dose  . acetaminophen (TYLENOL)  325 MG tablet Take 650 mg by mouth every 6 (six) hours as needed.   Past Month at Unknown time  . alendronate (FOSAMAX) 70 MG tablet Take 1 tablet (70 mg total) by mouth once a week. Take with a full glass of water on an empty stomach. 4 tablet 0 04/28/2019  . apixaban (ELIQUIS) 5 MG TABS tablet Take 1 tablet (5 mg total) by mouth 2 (two) times daily. 60 tablet 5 05/01/2019 at 0900pm  . calcium-vitamin D (OS-CAL 500 + D) 500-200 MG-UNIT tablet Take 1 tablet by mouth 2 (two) times daily.   05/01/2019 at Unknown time  . diltiazem (CARDIZEM CD) 360 MG 24 hr capsule Take 1 capsule (360 mg total) by mouth daily. 30 capsule 5 05/01/2019 at Unknown time  . furosemide (LASIX) 40 MG tablet Take 1 tablet (40 mg total) by mouth daily. 90 tablet 3 05/01/2019 at Unknown time  . magnesium oxide (MAG-OX) 400 MG tablet Take 1 tablet (400 mg total) by mouth 3 (three) times daily. 90 tablet 0 05/01/2019 at Unknown time  . memantine (NAMENDA) 5 MG tablet Take 1 tablet (5 mg total) by mouth 2 (two) times daily. 60 tablet 0 05/01/2019 at Unknown time  . metoprolol tartrate (LOPRESSOR) 25 MG tablet Take 1 tablet (25 mg total) by mouth 2 (two) times daily. 60 tablet 5 05/01/2019 at 0900pm  . pantoprazole (PROTONIX) 40 MG tablet Take 1 tablet (40 mg total) by mouth daily. 30 tablet 3 05/01/2019 at Unknown time  . Polyethyl Glycol-Propyl Glycol (SYSTANE OP) Place 1 drop into both eyes daily as needed. Dry Eyes    unknown  . QUEtiapine (SEROQUEL) 25 MG tablet Take 1 tablet (25 mg total) by mouth at bedtime. 30 tablet 0 05/01/2019 at Unknown time  . sertraline (ZOLOFT) 50 MG tablet Take 1 tablet (50 mg total) by mouth every morning. 30 tablet 0 05/01/2019 at Unknown time  . mirtazapine (REMERON) 7.5 MG tablet Take 1 tablet (7.5 mg total) by mouth at bedtime. (Patient not taking: Reported on 05/02/2019) 30 tablet 3 Not Taking at Unknown time  . NON FORMULARY Diet Type: NAS     . Ostomy Supplies (SKIN PREP WIPES) MISC Apply to  bilateral heels every shift for prevention      Scheduled: . apixaban  5 mg Oral BID  . calcium-vitamin D  1 tablet Oral BID  . diltiazem  360 mg Oral Daily  . feeding supplement (ENSURE ENLIVE)  237 mL Oral BID BM  . memantine  5 mg Oral BID  . metoprolol tartrate  25 mg Oral BID  . mirtazapine  7.5 mg Oral QHS  . multivitamin with minerals  1 tablet Oral Daily  . pantoprazole  40 mg Oral Daily  . potassium chloride  20 mEq Oral BID  . QUEtiapine  25 mg  Oral QHS  . sertraline  50 mg Oral q morning - 10a  . sodium chloride flush  3 mL Intravenous Q12H  . torsemide  20 mg Oral BID   Continuous: . sodium chloride     [id number] chloride, acetaminophen, ondansetron (ZOFRAN) IV, sodium chloride flush, zolpidem  Assesment: She was admitted with acute on chronic diastolic heart failure.  She is diuresing but fairly slowly.  I had anticipated putting her on oral meds today but I do not think we should do that since she is not really close to her baseline weight yet  She has dementia and is a little confused this morning  She has chronic atrial fib which is stable Active Problems:   Dementia (HCC)   Atrial fibrillation (HCC)   Acute on chronic diastolic congestive heart failure (HCC)   Acute on chronic diastolic CHF (congestive heart failure) ([place])   Advanced care planning/counseling discussion    Plan: Increase Lasix    LOS: 4 days   Kristy Mckinney 05/06/2019, 8:43 AM

## 2019-05-07 LAB — BASIC METABOLIC PANEL
Anion gap: 11 (ref 5–15)
BUN: 28 mg/dL — ABNORMAL HIGH (ref 8–23)
CO2: 33 mmol/L — ABNORMAL HIGH (ref 22–32)
Calcium: 9.4 mg/dL (ref 8.9–10.3)
Chloride: 95 mmol/L — ABNORMAL LOW (ref 98–111)
Creatinine, Ser: 0.98 mg/dL (ref 0.44–1.00)
GFR calc Af Amer: >60 mL/min (ref >60)
GFR calc non Af Amer: 53 mL/min — ABNORMAL LOW (ref >60)
Glucose, Bld: 114 mg/dL — ABNORMAL HIGH (ref 70–99)
Potassium: 3.7 mmol/L (ref 3.5–5.1)
Sodium: 139 mmol/L (ref 135–145)

## 2019-05-07 NOTE — Progress Notes (Signed)
Subjective: She says she feels okay.  She is minimally confused this morning.  No new complaints otherwise.  She is -5.5 L by intake and output but her weight does not reflect that so I am going to have her weight on one of the stand-up scales.  She has less edema.  Objective: Vital signs in last 24 hours: Temp:  [98.9 F (37.2 C)-99.6 F (37.6 C)] 98.9 F (37.2 C) (10/28 0540) Pulse Rate:  [77-92] 92 (10/28 0540) Resp:  [16] 16 (10/28 0540) BP: (102-117)/(51-68) 115/51 (10/28 0540) SpO2:  [96 %-97 %] 96 % (10/28 0540) Weight:  [66.5 kg] 66.5 kg (10/28 0500) Weight change: -0.4 kg Last BM Date: 05/06/19  Intake/Output from previous day: 10/27 0701 - 10/28 0700 In: 243 [P.O.:240; I.V.:3] Out: 2450 [Urine:2450]  PHYSICAL EXAM General appearance: alert, cooperative, no distress and Mildly confused Resp: She still has minimal rales but much clearer than before Cardio: irregularly irregular rhythm GI: soft, non-tender; bowel sounds normal; no masses,  no organomegaly Extremities: Trace edema  Lab Results:  Results for orders placed or performed during the hospital encounter of 05/02/19 (from the past 48 hour(s))  Basic metabolic panel     Status: Abnormal   Collection Time: 05/06/19  6:28 AM  Result Value Ref Range   Sodium 138 135 - 145 mmol/L   Potassium 4.5 3.5 - 5.1 mmol/L   Chloride 98 98 - 111 mmol/L   CO2 31 22 - 32 mmol/L   Glucose, Bld 119 (H) 70 - 99 mg/dL   BUN 29 (H) 8 - 23 mg/dL   Creatinine, Ser 0.96 0.44 - 1.00 mg/dL   Calcium 9.4 8.9 - 10.3 mg/dL   GFR calc non Af Amer 54 (L) >60 mL/min   GFR calc Af Amer >60 >60 mL/min   Anion gap 9 5 - 15    Comment: Performed at [facility], [address] [place], [place] [id number]  Basic metabolic panel     Status: Abnormal   Collection Time: 05/07/19  6:51 AM  Result Value Ref Range   Sodium 139 135 - 145 mmol/L   Potassium 3.7 3.5 - 5.1 mmol/L    Comment: DELTA CHECK NOTED   Chloride 95 (L) 98 - 111 mmol/L    CO2 33 (H) 22 - 32 mmol/L   Glucose, Bld 114 (H) 70 - 99 mg/dL   BUN 28 (H) 8 - 23 mg/dL   Creatinine, Ser 0.98 0.44 - 1.00 mg/dL   Calcium 9.4 8.9 - 10.3 mg/dL   GFR calc non Af Amer 53 (L) >60 mL/min   GFR calc Af Amer >60 >60 mL/min   Anion gap 11 5 - 15    Comment: Performed at [facility], [address] [place], [place] [address]    ABGS No results for input(s): PHART, PO2ART, TCO2, HCO3 in the last 72 hours.  Invalid input(s): PCO2 CULTURES Recent Results (from the past 240 hour(s))  SARS CORONAVIRUS 2 (TAT 6-24 HRS) Nasopharyngeal Nasopharyngeal Swab     Status: None   Collection Time: 05/02/19  5:26 AM   Specimen: Nasopharyngeal Swab  Result Value Ref Range Status   SARS Coronavirus 2 NEGATIVE NEGATIVE Final    Comment: (NOTE) SARS-CoV-2 target nucleic acids are NOT DETECTED. The SARS-CoV-2 RNA is generally detectable in upper and lower respiratory specimens during the acute phase of infection. Negative results do not preclude SARS-CoV-2 infection, do not rule out co-infections with other pathogens, and should not be used as the  sole basis for treatment or other patient management decisions. Negative results must be combined with clinical observations, patient history, and epidemiological information. The expected result is Negative. Fact Sheet for Patients: SugarRoll.be Fact Sheet for Healthcare Providers: https://www.woods-mathews.com/ This test is not yet approved or cleared by the [place] FDA and  has been authorized for detection and/or diagnosis of SARS-CoV-2 by FDA under an Emergency Use Authorization (EUA). This EUA will remain  in effect (meaning this test can be used) for the duration of the COVID-19 declaration under Section 56 4(b)(1) of the Act, 21 U.S.C. section 360bbb-3(b)(1), unless the authorization is terminated or revoked sooner. Performed at [facility], [place] [address], [place],  [place] [address]    Studies/Results: No results found.  Medications:  Prior to Admission:  Medications Prior to Admission  Medication Sig Dispense Refill Last Dose  . acetaminophen (TYLENOL) 325 MG tablet Take 650 mg by mouth every 6 (six) hours as needed.   Past Month at Unknown time  . alendronate (FOSAMAX) 70 MG tablet Take 1 tablet (70 mg total) by mouth once a week. Take with a full glass of water on an empty stomach. 4 tablet 0 04/28/2019  . apixaban (ELIQUIS) 5 MG TABS tablet Take 1 tablet (5 mg total) by mouth 2 (two) times daily. 60 tablet 5 05/01/2019 at 0900pm  . calcium-vitamin D (OS-CAL 500 + D) 500-200 MG-UNIT tablet Take 1 tablet by mouth 2 (two) times daily.   05/01/2019 at Unknown time  . diltiazem (CARDIZEM CD) 360 MG 24 hr capsule Take 1 capsule (360 mg total) by mouth daily. 30 capsule 5 05/01/2019 at Unknown time  . furosemide (LASIX) 40 MG tablet Take 1 tablet (40 mg total) by mouth daily. 90 tablet 3 05/01/2019 at Unknown time  . magnesium oxide (MAG-OX) 400 MG tablet Take 1 tablet (400 mg total) by mouth 3 (three) times daily. 90 tablet 0 05/01/2019 at Unknown time  . memantine (NAMENDA) 5 MG tablet Take 1 tablet (5 mg total) by mouth 2 (two) times daily. 60 tablet 0 05/01/2019 at Unknown time  . metoprolol tartrate (LOPRESSOR) 25 MG tablet Take 1 tablet (25 mg total) by mouth 2 (two) times daily. 60 tablet 5 05/01/2019 at 0900pm  . pantoprazole (PROTONIX) 40 MG tablet Take 1 tablet (40 mg total) by mouth daily. 30 tablet 3 05/01/2019 at Unknown time  . Polyethyl Glycol-Propyl Glycol (SYSTANE OP) Place 1 drop into both eyes daily as needed. Dry Eyes    unknown  . QUEtiapine (SEROQUEL) 25 MG tablet Take 1 tablet (25 mg total) by mouth at bedtime. 30 tablet 0 05/01/2019 at Unknown time  . sertraline (ZOLOFT) 50 MG tablet Take 1 tablet (50 mg total) by mouth every morning. 30 tablet 0 05/01/2019 at Unknown time  . mirtazapine (REMERON) 7.5 MG tablet Take 1 tablet (7.5 mg total)  by mouth at bedtime. (Patient not taking: Reported on 05/02/2019) 30 tablet 3 Not Taking at Unknown time  . NON FORMULARY Diet Type: NAS     . Ostomy Supplies (SKIN PREP WIPES) MISC Apply to bilateral heels every shift for prevention      Scheduled: . apixaban  5 mg Oral BID  . calcium-vitamin D  1 tablet Oral BID  . diltiazem  360 mg Oral Daily  . feeding supplement (ENSURE ENLIVE)  237 mL Oral BID BM  . furosemide  60 mg Intravenous Q12H  . memantine  5 mg Oral BID  . metoprolol tartrate  25 mg Oral BID  . mirtazapine  7.5 mg Oral QHS  . multivitamin with minerals  1 tablet Oral Daily  . pantoprazole  40 mg Oral Daily  . potassium chloride  20 mEq Oral BID  . QUEtiapine  25 mg Oral QHS  . sertraline  50 mg Oral q morning - 10a  . sodium chloride flush  3 mL Intravenous Q12H   Continuous: . sodium chloride     [id number] chloride, acetaminophen, ondansetron (ZOFRAN) IV, sodium chloride flush, zolpidem  Assesment: She was admitted with acute on chronic diastolic heart failure.  She is improving.  She is diuresed well but her weight does not reflect that so that is going to be rechecked.  She does look better clinically.  She had acute renal failure last admission so we have to be careful with diuresis.  She has chronic atrial fib and she is anticoagulated  She has dementia and she is somewhat confused this morning Active Problems:   Dementia (HCC)   Atrial fibrillation (HCC)   Acute on chronic diastolic congestive heart failure (HCC)   Acute on chronic diastolic CHF (congestive heart failure) ([place])   Advanced care planning/counseling discussion    Plan: Continue treatments.  Weigh on a stand-up scale today.  If that looks like she is substantially down from admission I will probably switch her to oral diuresis.    LOS: 5 days   Alonza Bogus 05/07/2019, 8:16 AM

## 2019-05-07 NOTE — Progress Notes (Signed)
Physical Therapy Treatment Patient Details Name: Kristy Mckinney MRN: [id number] DOB: 16-Feb-1932 Today's Date: 05/07/2019    History of Present Illness Kristy Mckinney is a 85 year old female with a history of permanent atrial fibrillation, anxiety, diastolic CHF, GERD presenting with 1 week history of shortness of breath.  Patient was recently admitted to the hospital from 04/10/2019 through 04/19/2019 for acute kidney injury secondary to gastroenteritis.  She was discharged from to [facility] where she was not initially restarted on her furosemide.  Her daughter contacted her cardiologist, and the patient received 3 days of furosemide at the facility after which she did not receive any further furosemide.  In the interim, the patient had noted increasing lower extremity edema, orthopnea, and dyspnea on exertion.  The patient had a visit with her cardiology office on 05/01/2019.  Admission was offered, but the patient did not want to be admitted at that time.  Unfortunately, she developed worsening shortness of breath resulting in her ED visit.  She denies any fevers, chills, chest pain, headache, coughing, hemoptysis, nausea, vomiting, diarrhea, abdominal pain.  Notably, the patient also had an admission earlier this calendar year from 11/28/2018 through 12/05/2018 during which she sustained a nondisplaced right intertrochanteric femur fracture requiring repair.In the ED, the patient was afebrile hemodynamically stable with oxygen saturation of 95% room air.  She was started on IV furosemide.  BMP and CBC were essentially unremarkable.  BNP was 473.  Chest x-ray showed bilateral pleural effusions and vascular congestion.    PT Comments    Pt standing in front of sink brushing teeth and washing face with no AD, pt slower cadence but very aware of surroundings and safe mechanics.  Used RW with gait training for improved gait mechanics safely, no LOB episodes and able to ambulate longer distance.  EOS pt  limited by fatigue.  No SOB noted and O2 saturation range from 92-98% on 2L O2.  Monitored heart rate as well that increased to 135 immediately following gait.  RN aware of status.  EOS pt left in chair with call bell within reach and chair alarm set.  Next session begin therapy without O2 A, monitor vitals.   Follow Up Recommendations  Home health PT;Supervision - Intermittent     Equipment Recommendations  None recommended by PT    Recommendations for Other Services       Precautions / Restrictions Precautions Precautions: Fall Restrictions Weight Bearing Restrictions: No    Mobility  Bed Mobility               General bed mobility comments: Pt standing brushing teeth upon therapist entrance  Transfers Overall transfer level: Modified independent   Transfers: Sit to/from Stand Sit to Stand: Supervision         General transfer comment: Pt able to demonstrate safe mechanics with STS, increased time and labored movement when not using AD  Ambulation/Gait Ambulation/Gait assistance: Supervision;Min guard Gait Distance (Feet): 96 Feet Assistive device: Rolling walker (2 wheeled) Gait Pattern/deviations: Decreased step length - right;Decreased step length - left;Decreased stride length     General Gait Details: Increased cadence and distance with RW no LOB episodes.  O2 Saturation range from 92-98%.  HR range from 74 to 135 bpm.   Stairs             Wheelchair Mobility    Modified Rankin (Stroke Patients Only)       Balance  Cognition Arousal/Alertness: Awake/alert Behavior During Therapy: WFL for tasks assessed/performed Overall Cognitive Status: Within Functional Limits for tasks assessed                                        Exercises      General Comments        Pertinent Vitals/Pain Pain Assessment: 0-10    Home Living                       Prior Function            PT Goals (current goals can now be found in the care plan section)      Frequency    Min 3X/week      PT Plan Current plan remains appropriate    Co-evaluation              AM-PAC PT "6 Clicks" Mobility   Outcome Measure  Help needed turning from your back to your side while in a flat bed without using bedrails?: None Help needed moving from lying on your back to sitting on the side of a flat bed without using bedrails?: None Help needed moving to and from a bed to a chair (including a wheelchair)?: A Little Help needed standing up from a chair using your arms (e.g., wheelchair or bedside chair)?: None Help needed to walk in hospital room?: A Little Help needed climbing 3-5 steps with a railing? : A Little 6 Click Score: 21    End of Session Equipment Utilized During Treatment: Oxygen;Gait belt Activity Tolerance: Patient tolerated treatment well;Patient limited by fatigue Patient left: in chair;with call bell/phone within reach Nurse Communication: Mobility status PT Visit Diagnosis: Unsteadiness on feet (R26.81);Other abnormalities of gait and mobility (R26.89);Muscle weakness (generalized) (M62.81)     Time: 1010-1030 PT Time Calculation (min) (ACUTE ONLY): 20 min  Charges:  $Therapeutic Activity: 8-22 mins                     [address], LPTA; CBIS [phone]  Aldona Lento 05/07/2019, 12:14 PM

## 2019-05-07 NOTE — Care Management Important Message (Signed)
Important Message  Patient Details  Name: Kristy Mckinney MRN: [id number] Date of Birth: 17-Dec-1931   Medicare Important Message Given:  Yes     Tommy Medal 05/07/2019, 10:59 AM

## 2019-05-07 NOTE — Progress Notes (Signed)
Notified by CCMD patient HR sustaining RVR in 130-140s with a max HR in the 150s. Dr. Luan Pulling made aware, patient ambulating in room with PT. Patient stated "I feel fine." Will continue to monitor.

## 2019-05-08 DIAGNOSIS — I5031 Acute diastolic (congestive) heart failure: Secondary | ICD-10-CM | POA: Diagnosis not present

## 2019-05-08 DIAGNOSIS — I4821 Permanent atrial fibrillation: Secondary | ICD-10-CM

## 2019-05-08 DIAGNOSIS — I350 Nonrheumatic aortic (valve) stenosis: Secondary | ICD-10-CM | POA: Diagnosis not present

## 2019-05-08 LAB — BASIC METABOLIC PANEL
Anion gap: 11 (ref 5–15)
BUN: 35 mg/dL — ABNORMAL HIGH (ref 8–23)
CO2: 34 mmol/L — ABNORMAL HIGH (ref 22–32)
Calcium: 9.2 mg/dL (ref 8.9–10.3)
Chloride: 94 mmol/L — ABNORMAL LOW (ref 98–111)
Creatinine, Ser: 1.06 mg/dL — ABNORMAL HIGH (ref 0.44–1.00)
GFR calc Af Amer: 56 mL/min — ABNORMAL LOW (ref >60)
GFR calc non Af Amer: 48 mL/min — ABNORMAL LOW (ref >60)
Glucose, Bld: 109 mg/dL — ABNORMAL HIGH (ref 70–99)
Potassium: 3.7 mmol/L (ref 3.5–5.1)
Sodium: 139 mmol/L (ref 135–145)

## 2019-05-08 MED ORDER — POTASSIUM CHLORIDE CRYS ER 20 MEQ PO TBCR
40.0000 meq | EXTENDED_RELEASE_TABLET | Freq: Two times a day (BID) | ORAL | Status: DC
  Administered 2019-05-08 – 2019-05-10 (×5): 40 meq via ORAL
  Filled 2019-05-08 (×6): qty 2

## 2019-05-08 NOTE — Consult Note (Addendum)
Cardiology Consult    Patient ID: Kristy Mckinney; [id number]; 06/29/1932   Admit date: 05/02/2019 Date of Consult: 05/08/2019  Primary Care Provider: [name], MD Primary Cardiologist: Rozann Lesches, MD    Patient Profile    AUBRA Mckinney is a 85 y.o. female with past medical history of permament atrial fibrillation, mild AS, chronic back pain, and recent issues with acute diastolic CHF following administration of IVF during admission for AKI who is being seen today for the evaluation of CHF at the request of Dr. Luan Pulling.   History of Present Illness    [name] was recently admitted on 04/10/2019 for evaluation of nausea and vomiting and found to have hyperkalemia and AKI. Nephrology followed the patient during admission and her AKI was felt to be secondary to hypovolemia and ATN. She received a significant amount of IVF during admission and weight increased to 168 lbs at the time of discharge (previous baseline in the 130's - at 137 lbs in 03/2019).  She had a telehealth visit with myself on 04/22/2019 while at SNF and was started on 40 mg daily for 3 days with follow-up BMET. It was unclear if this was administered and her family actually signed her out of rehab on 04/30/2019 due to concerns about the care she was receiving. She had an office visit with myself the following day and reported having lost 5 pounds since taking 40mg  of Lasix upon returning home. She was started on Lasix 40 mg daily and it was reviewed with the patient and her daughter that she was at high risk for readmission given her volume overload and recent hospitalizations.   She presented back to [facility] ED on 05/02/2019 for worsening dyspnea and a dry cough. Labs showed WBC 6.3, Hgb 11.1, platelets 231, 137, K+ 3.5 and creatinine 1.05. (had been at 1.24 on 04/19/2019). Initial and repeat HS Troponin values flat at 8. CXR showed bilateral pleural effusions with edema and vascular congestion. EKG  shows rate-controlled atrial fibrillation, HR 92 with no acute ST abnormalities.   She was started on IV Lasix 40mg  BID at the time of admission and this was titrated to 60mg  BID on 05/06/2019. She has a recorded net output of -5.5L thus far. Recorded weights have been variable as this was 161 lbs on admission. A standing weight was obtained yesterday and showed this had declined to 142 lbs but her bed weight this morning was 150 lbs. Creatinine was 1.05 at the time of admission and initially improved (0.93 --> 0.92 --> 0.89 --> 0.96 --> 0.98) but increased slightly to 1.06 today.   In talking with the patient today, she reports improvement in her dyspnea but says that this has not returned to baseline.  She reports still having pain along her lower extremities but says this has improved as her fluid level has declined.  Denies any specific chest pain or palpitations.  Was unaware of her elevated rates while walking with PT yesterday.   Past Medical History:  Diagnosis Date   Atrial fibrillation ([place])    Chronic back pain    Diarrhea    Dysphagia 05/03/2016   GERD (gastroesophageal reflux disease)     Past Surgical History:  Procedure Laterality Date   ABDOMINAL HYSTERECTOMY     BACK SURGERY     CHOLECYSTECTOMY N/A 01/29/2014   Procedure: LAPAROSCOPIC CHOLECYSTECTOMY;  Surgeon: Scherry Ran, MD;  Location: [facility];  Service: General;  Laterality: N/A;   COLONOSCOPY N/A 10/27/2015  Procedure: COLONOSCOPY;  Surgeon: Rogene Houston, MD;  Location: [facility];  Service: Endoscopy;  Laterality: N/A;  215   INTRAMEDULLARY (IM) NAIL INTERTROCHANTERIC Right 11/29/2018   Procedure: INTRAMEDULLARY (IM) NAIL INTERTROCHANTRIC FRACTURE;  Surgeon: Renette Butters, MD;  Location: [place];  Service: Orthopedics;  Laterality: Right;   TONSILLECTOMY     YAG LASER APPLICATION Left [id number]   Procedure: YAG LASER APPLICATION;  Surgeon: Elta Guadeloupe T. Gershon Crane, MD;  Location: [facility];  Service:  Ophthalmology;  Laterality: Left;  left     Home Medications:  Prior to Admission medications   Medication Sig Start Date End Date Taking? Authorizing Provider  acetaminophen (TYLENOL) 325 MG tablet Take 650 mg by mouth every 6 (six) hours as needed. 12/05/18  Yes [provider]  alendronate (FOSAMAX) 70 MG tablet Take 1 tablet (70 mg total) by mouth once a week. Take with a full glass of water on an empty stomach. 12/23/18  Yes Gerlene Fee, NP  apixaban (ELIQUIS) 5 MG TABS tablet Take 1 tablet (5 mg total) by mouth 2 (two) times daily. 04/07/19  Yes [name], MD  calcium-vitamin D (OS-CAL 500 + D) 500-200 MG-UNIT tablet Take 1 tablet by mouth 2 (two) times daily. 12/05/18  Yes Oretha Milch D, MD  diltiazem (CARDIZEM CD) 360 MG 24 hr capsule Take 1 capsule (360 mg total) by mouth daily. 04/07/19  Yes [name], MD  furosemide (LASIX) 40 MG tablet Take 1 tablet (40 mg total) by mouth daily. 05/01/19 07/30/19 Yes Fultz, Fransisco Hertz, PA-C  magnesium oxide (MAG-OX) 400 MG tablet Take 1 tablet (400 mg total) by mouth 3 (three) times daily. 12/23/18  Yes Gerlene Fee, NP  memantine (NAMENDA) 5 MG tablet Take 1 tablet (5 mg total) by mouth 2 (two) times daily. 12/23/18  Yes Gerlene Fee, NP  metoprolol tartrate (LOPRESSOR) 25 MG tablet Take 1 tablet (25 mg total) by mouth 2 (two) times daily. 04/08/19  Yes [name], MD  pantoprazole (PROTONIX) 40 MG tablet Take 1 tablet (40 mg total) by mouth daily. 04/19/19  Yes Roxan Hockey, MD  Polyethyl Glycol-Propyl Glycol (SYSTANE OP) Place 1 drop into both eyes daily as needed. Dry Eyes    Yes [provider]  QUEtiapine (SEROQUEL) 25 MG tablet Take 1 tablet (25 mg total) by mouth at bedtime. 12/23/18  Yes Gerlene Fee, NP  sertraline (ZOLOFT) 50 MG tablet Take 1 tablet (50 mg total) by mouth every morning. 12/23/18  Yes Gerlene Fee, NP  mirtazapine (REMERON) 7.5 MG tablet Take 1 tablet (7.5 mg total) by  mouth at bedtime. Patient not taking: Reported on 05/02/2019 04/19/19 04/18/20  Roxan Hockey, MD  NON FORMULARY Diet Type: NAS 12/05/18   [provider]  Ostomy Supplies (SKIN PREP WIPES) MISC Apply to bilateral heels every shift for prevention 12/06/18   [provider]    Inpatient Medications: Scheduled Meds:  apixaban  5 mg Oral BID   calcium-vitamin D  1 tablet Oral BID   diltiazem  360 mg Oral Daily   feeding supplement (ENSURE ENLIVE)  237 mL Oral BID BM   furosemide  60 mg Intravenous Q12H   memantine  5 mg Oral BID   metoprolol tartrate  25 mg Oral BID   mirtazapine  7.5 mg Oral QHS   multivitamin with minerals  1 tablet Oral Daily   pantoprazole  40 mg Oral Daily   potassium chloride  40 mEq Oral BID  QUEtiapine  25 mg Oral QHS   sertraline  50 mg Oral q morning - 10a   sodium chloride flush  3 mL Intravenous Q12H   Continuous Infusions:  sodium chloride     PRN Meds: sodium chloride, acetaminophen, ondansetron (ZOFRAN) IV, sodium chloride flush, zolpidem  Allergies:    Allergies  Allergen Reactions   Penicillins Anaphylaxis   Emetrol Nausea Only    swelling   Feldene [Piroxicam] Nausea And Vomiting   Sulfa Antibiotics     swelling    Social History:   Social History   Socioeconomic History   Marital status: Widowed    Spouse name: Not on file   Number of children: Not on file   Years of education: Not on file   Highest education level: Not on file  Occupational History   Not on file  Social Needs   Financial resource strain: Not hard at all   Food insecurity    Worry: Never true    Inability: Never true   Transportation needs    Medical: No    Non-medical: No  Tobacco Use   Smoking status: Never Smoker   Smokeless tobacco: Never Used  Substance and Sexual Activity   Alcohol use: No   Drug use: No   Sexual activity: Yes    Birth control/protection: Surgical  Lifestyle   Physical  activity    Days per week: 5 days    Minutes per session: 30 min   Stress: Not at all  Relationships   Social connections    Talks on phone: More than three times a week    Gets together: More than three times a week    Attends religious service: More than 4 times per year    Active member of club or organization: Yes    Attends meetings of clubs or organizations: More than 4 times per year    Relationship status: Widowed   Intimate partner violence    Fear of current or ex partner: No    Emotionally abused: No    Physically abused: No    Forced sexual activity: No  Other Topics Concern   Not on file  Social History Narrative   Pt lives in an apartment complex, reports that she still drives     Family History:    Family History  Problem Relation Age of Onset   Hypertension Child       Review of Systems    General:  No chills, fever, night sweats or weight changes.  Cardiovascular:  No chest pain, orthopnea, palpitations, paroxysmal nocturnal dyspnea. Positive for dyspnea and edema.  Dermatological: No rash, lesions/masses Respiratory: Positive for dry cough and dyspnea. Urologic: No hematuria, dysuria Abdominal:   No nausea, vomiting, diarrhea, bright red blood per rectum, melena, or hematemesis Neurologic:  No visual changes, wkns, changes in mental status. All other systems reviewed and are otherwise negative except as noted above.  Physical Exam/Data    Vitals:   05/07/19 2125 05/07/19 2200 05/08/19 0430 05/08/19 0554  BP: (!) 124/108 (!) 104/55  137/60  Pulse: 88 65  86  Resp:    16  Temp:    98.3 F (36.8 C)  TempSrc:    Oral  SpO2: 100%   96%  Weight:   68.1 kg   Height:        Intake/Output Summary (Last 24 hours) at 05/08/2019 0929 Last data filed at 05/08/2019 0900 Gross per 24 hour  Intake 600 ml  Output  600 ml  Net 0 ml   Filed Weights   05/07/19 0500 05/07/19 0900 05/08/19 0430  Weight: 66.5 kg 64.5 kg 68.1 kg   Body mass index is  25.37 kg/m.   General: Pleasant elderly female appearing in NAD Psych: Normal affect. Neuro: Alert and oriented X 3. Moves all extremities spontaneously. HEENT: Normal  Neck: Supple without bruits or JVD. Lungs:  Resp regular and unlabored, mild rales along bases bilaterally. Heart: Irregularly irregular. No s3, s4, or murmurs. Abdomen: Soft, non-tender, non-distended, BS + x 4.  Extremities: No clubbing cyanosis. 1+ pitting edema, most notable along LLE. DP/PT/Radials 2+ and equal bilaterally.   EKG:  The EKG was personally reviewed and demonstrates: Rate-controlled atrial fibrillation, HR 92 with no acute ST abnormalities.   Telemetry:  Telemetry was personally reviewed and demonstrates: Atrial fibrillation, HR in 70's to 90's.    Labs/Studies     Relevant CV Studies:  Echocardiogram: 03/2019  IMPRESSIONS    1. Left ventricular ejection fraction, by visual estimation, is 70 to 75%. The left ventricle has hyperdynamic function. Decreased left ventricular size. There is moderately increased left ventricular hypertrophy.  2. Left ventricular diastolic Doppler parameters are indeterminate pattern of LV diastolic filling.  3. Right ventricular volume and pressure overload.  4. Global right ventricle has normal systolic function.The right ventricular size is moderately enlarged. Mildly increased right ventricular wall thickness.  5. Left atrial size was severely dilated.  6. Right atrial size was normal.  7. Presence of pericardial fat pad.  8. Moderate aortic valve annular calcification.  9. Moderate to severe mitral annular calcification. 10. Severe calcification of the mitral valve leaflet(s). Valve appears stenotic, not completely evaluated. Suggest follow-up images with VTI measurements or planimetry if possible. 11. The mitral valve is degenerative. Mild mitral valve regurgitation. 12. The tricuspid valve is grossly normal. Tricuspid valve regurgitation is mild. 13.  Aortic valve area, by VTI measures 1.83 cm. 14. Aortic valve mean gradient measures 8.8 mmHg. 15. The aortic valve is tricuspid Aortic valve regurgitation was not visualized by color flow Doppler. Mild aortic valve stenosis. 16. Moderately elevated pulmonary artery systolic pressure. 17. The tricuspid regurgitant velocity is 3.67 m/s, and with an assumed right atrial pressure of 8 mmHg, the estimated right ventricular systolic pressure is moderately elevated at 61.9 mmHg. 18. The inferior vena cava is normal in size with <50% respiratory variability, suggesting right atrial pressure of 8 mmHg. 19. The pulmonic valve was grossly normal. Pulmonic valve regurgitation is trivial by color flow Doppler.  Laboratory Data:  Chemistry Recent Labs  Lab 05/06/19 [phone] 05/07/19 0651 05/08/19 0625  NA 138 139 139  K 4.5 3.7 3.7  CL 98 95* 94*  CO2 31 33* 34*  GLUCOSE 119* 114* 109*  BUN 29* 28* 35*  CREATININE 0.96 0.98 1.06*  CALCIUM 9.4 9.4 9.2  GFRNONAA 54* 53* 48*  GFRAA >60 >60 56*  ANIONGAP 9 11 11     No results for input(s): PROT, ALBUMIN, AST, ALT, ALKPHOS, BILITOT in the last 168 hours. Hematology Recent Labs  Lab 05/02/19 0505  WBC 6.3  RBC 4.34  HGB 11.1*  HCT 37.6  MCV 86.6  MCH 25.6*  MCHC 29.5*  RDW 16.0*  PLT 231   Cardiac EnzymesNo results for input(s): TROPONINI in the last 168 hours. No results for input(s): TROPIPOC in the last 168 hours.  BNP Recent Labs  Lab 05/02/19 0505  BNP 473.0*    DDimer No results for input(s): DDIMER in the  last 168 hours.  Radiology/Studies:  No results found.   Assessment & Plan    1. Acute Diastolic CHF Exacerbation - Her issues with fluid retention occurred following an admission for an AKI during which her weight increased by 30+ pounds. Unfortunately, it was unclear if she was receiving Lasix while at SNF as prescribed. - Presented with worsening dyspnea and BNP found to be elevated to 473 with CXR showing bilateral  effusions and vascular congestion. She has overall responded well to IV Lasix and is negative -5.5L thus far. Weights have been variable but this was 161 lbs on admission and was 142 lbs yesterday by standing weight which is close to her baseline of 136-137 lbs. Recorded as 150 lbs this AM but doubt accuracy of this and I have asked for a standing weight to be obtained. - Her creatinine has slightly increased to 1.06 but this was actually 1.05 at the time of admission and remains improved when compared to her prior values over the past several weeks.  Would continue with IV Lasix at current dosing for now.  Recheck BMET and BNP tomorrow. Will ask for compression stockings to be applied.   2. Permanent Atrial Fibrillation - Rates have overall been well controlled in the 70's to 80's within the past 24 hours. By review of notes, rates did increase into the 150's while working with PT yesterday. She remains on Cardizem CD 360 mg daily and Lopressor 25 mg twice daily. Could further titrate Lopressor pending heart rate response but would need to be cautious given issues with bradycardia during prior admissions. - She denies any evidence of active bleeding. Continue Eliquis 5 mg twice daily for anticoagulation.  3. Aortic Stenosis - Mild by echocardiogram in 03/2019. Continue to follow as an outpatient.   For questions or updates, please contact [place] Please consult [email or web address] for contact info under Cardiology/STEMI.  Signed, Erma Heritage, PA-C 05/08/2019, 9:29 AM Pager: [phone]  The patient was seen and examined, and I agree with the history, physical exam, assessment and plan as documented above, with modifications as noted below. I have also personally reviewed all relevant documentation, old records, labs, and both radiographic and cardiovascular studies. I have also independently interpreted old and new ECG's.  Briefly, this is an 85 year old woman with permanent atrial  fibrillation and mild aortic stenosis who presented to the hospital on 05/02/2019 and acute diastolic heart failure.  She was hospitalized earlier this month with presumable viral gastroenteritis and had acute kidney injury and hyperkalemia and developed bradycardia with metoprolol.  Dr. Maryjean Ka started her on IV Lasix 40 mg twice daily which was increased to 60 mg twice daily.  She has had 5.5 L output thus far but still has some residual leg edema, left greater than right.  She told me that her leg swelling has improved but they still are somewhat tight.  She complains of back pain and bilateral upper shoulder pain and buttock pain this morning.  Weights have fluctuated and I doubt their accuracy.  She had some elevated heart rates and walking with physical therapy but was asymptomatic during that time.  I would recommend continuing IV Lasix 60 mg twice daily with close monitoring of renal function (creatinine 1.06 today, 0.98 yesterday, BUN 35 today and was 28 yesterday) but I suspect she will need to be transitioned to oral Lasix on 05/09/2019 given upward trend of BUN and creatinine.  She will also need compression stockings for venous insufficiency.  We will plan  to check a BNP tomorrow (473 on 05/02/2019).  Overall her heart rates have been well controlled on Cardizem CD 360 mg daily and Lopressor 25 mg twice daily.  I would be cautious about increasing the dose of Lopressor as she developed bradycardia during an earlier hospitalization but this was in the setting of acute kidney injury and hyperkalemia.   Kate Sable, MD, [facility]  05/08/2019 10:20 AM

## 2019-05-08 NOTE — Progress Notes (Signed)
Subjective: She says she feels okay.  Her weights are up and down so were going to get another weight on the stand-up scale.  She still has swelling of her left leg greater than right and it appears to be more than yesterday she says her breathing is okay  Objective: Vital signs in last 24 hours: Temp:  [98.3 F (36.8 C)-100 F (37.8 C)] 98.3 F (36.8 C) (10/29 0554) Pulse Rate:  [65-88] 86 (10/29 0554) Resp:  [16] 16 (10/29 0554) BP: (104-137)/(51-108) 137/60 (10/29 0554) SpO2:  [96 %-100 %] 96 % (10/29 0554) Weight:  [64.5 kg-68.1 kg] 68.1 kg (10/29 0430) Weight change: -1.998 kg Last BM Date: 05/07/19  Intake/Output from previous day: 10/28 0701 - 10/29 0700 In: 600 [P.O.:600] Out: 600 [Urine:600]  PHYSICAL EXAM Constitutional: She is awake and alert.  She is mildly confused.  Cardiovascular: Her heart is shows atrial fib and I do not hear a gallop.  Respiratory: Respiratory effort is normal.  She is wearing oxygen.  She has fine rales in both bases.  Gastrointestinal: Her abdomen is soft with no masses no tenderness.  Extremities: She still has edema of the left greater than right leg and the edema in the left leg looks a little bit more than yesterday.  Neurological: She is still mildly confused Lab Results:  Results for orders placed or performed during the hospital encounter of 05/02/19 (from the past 48 hour(s))  Basic metabolic panel     Status: Abnormal   Collection Time: 05/07/19  6:51 AM  Result Value Ref Range   Sodium 139 135 - 145 mmol/L   Potassium 3.7 3.5 - 5.1 mmol/L    Comment: DELTA CHECK NOTED   Chloride 95 (L) 98 - 111 mmol/L   CO2 33 (H) 22 - 32 mmol/L   Glucose, Bld 114 (H) 70 - 99 mg/dL   BUN 28 (H) 8 - 23 mg/dL   Creatinine, Ser 0.98 0.44 - 1.00 mg/dL   Calcium 9.4 8.9 - 10.3 mg/dL   GFR calc non Af Amer 53 (L) >60 mL/min   GFR calc Af Amer >60 >60 mL/min   Anion gap 11 5 - 15    Comment: Performed at [facility], [address] [place],  [place] [id number]  Basic metabolic panel     Status: Abnormal   Collection Time: 05/08/19  6:25 AM  Result Value Ref Range   Sodium 139 135 - 145 mmol/L   Potassium 3.7 3.5 - 5.1 mmol/L   Chloride 94 (L) 98 - 111 mmol/L   CO2 34 (H) 22 - 32 mmol/L   Glucose, Bld 109 (H) 70 - 99 mg/dL   BUN 35 (H) 8 - 23 mg/dL   Creatinine, Ser 1.06 (H) 0.44 - 1.00 mg/dL   Calcium 9.2 8.9 - 10.3 mg/dL   GFR calc non Af Amer 48 (L) >60 mL/min   GFR calc Af Amer 56 (L) >60 mL/min   Anion gap 11 5 - 15    Comment: Performed at [facility], [address], [place], [place] [address]    ABGS No results for input(s): PHART, PO2ART, TCO2, HCO3 in the last 72 hours.  Invalid input(s): PCO2 CULTURES Recent Results (from the past 240 hour(s))  SARS CORONAVIRUS 2 (TAT 6-24 HRS) Nasopharyngeal Nasopharyngeal Swab     Status: None   Collection Time: 05/02/19  5:26 AM   Specimen: Nasopharyngeal Swab  Result Value Ref Range Status   SARS Coronavirus 2 NEGATIVE NEGATIVE Final  Comment: (NOTE) SARS-CoV-2 target nucleic acids are NOT DETECTED. The SARS-CoV-2 RNA is generally detectable in upper and lower respiratory specimens during the acute phase of infection. Negative results do not preclude SARS-CoV-2 infection, do not rule out co-infections with other pathogens, and should not be used as the sole basis for treatment or other patient management decisions. Negative results must be combined with clinical observations, patient history, and epidemiological information. The expected result is Negative. Fact Sheet for Patients: SugarRoll.be Fact Sheet for Healthcare Providers: https://www.woods-mathews.com/ This test is not yet approved or cleared by the [place] FDA and  has been authorized for detection and/or diagnosis of SARS-CoV-2 by FDA under an Emergency Use Authorization (EUA). This EUA will remain  in effect (meaning this test can be used) for the duration of  the COVID-19 declaration under Section 56 4(b)(1) of the Act, 21 U.S.C. section 360bbb-3(b)(1), unless the authorization is terminated or revoked sooner. Performed at [facility], [place] [address] [place], [place] [address]    Studies/Results: No results found.  Medications:  Prior to Admission:  Medications Prior to Admission  Medication Sig Dispense Refill Last Dose  . acetaminophen (TYLENOL) 325 MG tablet Take 650 mg by mouth every 6 (six) hours as needed.   Past Month at Unknown time  . alendronate (FOSAMAX) 70 MG tablet Take 1 tablet (70 mg total) by mouth once a week. Take with a full glass of water on an empty stomach. 4 tablet 0 04/28/2019  . apixaban (ELIQUIS) 5 MG TABS tablet Take 1 tablet (5 mg total) by mouth 2 (two) times daily. 60 tablet 5 05/01/2019 at 0900pm  . calcium-vitamin D (OS-CAL 500 + D) 500-200 MG-UNIT tablet Take 1 tablet by mouth 2 (two) times daily.   05/01/2019 at Unknown time  . diltiazem (CARDIZEM CD) 360 MG 24 hr capsule Take 1 capsule (360 mg total) by mouth daily. 30 capsule 5 05/01/2019 at Unknown time  . furosemide (LASIX) 40 MG tablet Take 1 tablet (40 mg total) by mouth daily. 90 tablet 3 05/01/2019 at Unknown time  . magnesium oxide (MAG-OX) 400 MG tablet Take 1 tablet (400 mg total) by mouth 3 (three) times daily. 90 tablet 0 05/01/2019 at Unknown time  . memantine (NAMENDA) 5 MG tablet Take 1 tablet (5 mg total) by mouth 2 (two) times daily. 60 tablet 0 05/01/2019 at Unknown time  . metoprolol tartrate (LOPRESSOR) 25 MG tablet Take 1 tablet (25 mg total) by mouth 2 (two) times daily. 60 tablet 5 05/01/2019 at 0900pm  . pantoprazole (PROTONIX) 40 MG tablet Take 1 tablet (40 mg total) by mouth daily. 30 tablet 3 05/01/2019 at Unknown time  . Polyethyl Glycol-Propyl Glycol (SYSTANE OP) Place 1 drop into both eyes daily as needed. Dry Eyes    unknown  . QUEtiapine (SEROQUEL) 25 MG tablet Take 1 tablet (25 mg total) by mouth at bedtime. 30 tablet 0  05/01/2019 at Unknown time  . sertraline (ZOLOFT) 50 MG tablet Take 1 tablet (50 mg total) by mouth every morning. 30 tablet 0 05/01/2019 at Unknown time  . mirtazapine (REMERON) 7.5 MG tablet Take 1 tablet (7.5 mg total) by mouth at bedtime. (Patient not taking: Reported on 05/02/2019) 30 tablet 3 Not Taking at Unknown time  . NON FORMULARY Diet Type: NAS     . Ostomy Supplies (SKIN PREP WIPES) MISC Apply to bilateral heels every shift for prevention      Scheduled: . apixaban  5 mg Oral BID  .  calcium-vitamin D  1 tablet Oral BID  . diltiazem  360 mg Oral Daily  . feeding supplement (ENSURE ENLIVE)  237 mL Oral BID BM  . furosemide  60 mg Intravenous Q12H  . memantine  5 mg Oral BID  . metoprolol tartrate  25 mg Oral BID  . mirtazapine  7.5 mg Oral QHS  . multivitamin with minerals  1 tablet Oral Daily  . pantoprazole  40 mg Oral Daily  . potassium chloride  40 mEq Oral BID  . QUEtiapine  25 mg Oral QHS  . sertraline  50 mg Oral q morning - 10a  . sodium chloride flush  3 mL Intravenous Q12H   Continuous: . sodium chloride     [id number] chloride, acetaminophen, ondansetron (ZOFRAN) IV, sodium chloride flush, zolpidem  Assesment: She was admitted with acute on chronic diastolic heart failure.  She has diuresed well now down about 5.5 L.  She has chronic atrial fib and she had an episode of RVR yesterday but ventricular response is good today  She has dementia which is unchanged  Plan: Cardiology consultation is underway.  Continue other treatments.  Have her weight on a stand-up scale and see where her weight is    LOS: 6 days   Kristy Mckinney 05/08/2019, 8:28 AM

## 2019-05-08 NOTE — Progress Notes (Signed)
Daily Progress Note   Patient Name: Kristy Mckinney       Date: 05/08/2019 DOB: Jan 21, 1932  Age: 85 y.o. MRN#: [id number] Attending Physician: [name], MD Primary Care Physician: [name], MD Admit Date: 05/02/2019  Reason for Consultation/Follow-up: Establishing goals of care  Subjective: Kristy Mckinney is sitting up in the chair. Does not feel SOB, but c/o pain in her neck and back that she notes is chronic.  Spoke with her daughterTye Maryland- [place] continue to be same. [name] is in agreement with Palliative f/u at discharge at home for continued support.   ROS  Length of Stay: 6  Current Medications: Scheduled Meds:  . apixaban  5 mg Oral BID  . calcium-vitamin D  1 tablet Oral BID  . diltiazem  360 mg Oral Daily  . feeding supplement (ENSURE ENLIVE)  237 mL Oral BID BM  . furosemide  60 mg Intravenous Q12H  . memantine  5 mg Oral BID  . metoprolol tartrate  25 mg Oral BID  . mirtazapine  7.5 mg Oral QHS  . multivitamin with minerals  1 tablet Oral Daily  . pantoprazole  40 mg Oral Daily  . potassium chloride  40 mEq Oral BID  . QUEtiapine  25 mg Oral QHS  . sertraline  50 mg Oral q morning - 10a  . sodium chloride flush  3 mL Intravenous Q12H    Continuous Infusions: . sodium chloride      PRN Meds: sodium chloride, acetaminophen, ondansetron (ZOFRAN) IV, sodium chloride flush, zolpidem  Physical Exam          Vital Signs: BP (!) 91/43 (BP Location: Right Arm)   Pulse 62   Temp 98.9 F (37.2 C) (Oral)   Resp 17   Ht 5' 4.5" (1.638 m)   Wt 64.7 kg   SpO2 99%   BMI 24.12 kg/m  SpO2: SpO2: 99 % O2 Device: O2 Device: Nasal Cannula O2 Flow Rate: O2 Flow Rate (L/min): 2 L/min  Intake/output summary:   Intake/Output Summary (Last 24 hours) at  05/08/2019 1450 Last data filed at 05/08/2019 0900 Gross per 24 hour  Intake 360 ml  Output 600 ml  Net -240 ml   LBM: Last BM Date: 05/07/19 Baseline Weight: Weight: 73 kg Most recent weight: Weight: 64.7 kg  Palliative Assessment/Data: PPS: 50%      Patient Active Problem List   Diagnosis Date Noted  . Advanced care planning/counseling discussion   . Acute on chronic diastolic congestive heart failure ([place]) 05/02/2019  . Acute on chronic diastolic CHF (congestive heart failure) ([place]) 05/02/2019  . Palliative care by specialist   . Goals of care, counseling/discussion   . Generalized weakness   . Pleural effusion 04/13/2019  . Pancreatitis, acute 04/13/2019  . Acute respiratory failure with hypoxia ([place]) 04/13/2019  . Bradycardia 04/10/2019  . Hypotension 04/10/2019  . Hyperkalemia 04/10/2019  . ARF (acute renal failure) ([place]) 04/10/2019  . Atrial fibrillation ([place]) 04/06/2019  . A-fib ([place]) 04/04/2019  . Dementia ([place]) 12/12/2018  . GERD without esophagitis 12/06/2018  . Post-menopausal osteoporosis 12/06/2018  . Acute delirium 12/06/2018  . Hypomagnesemia 12/06/2018  . Acute blood loss anemia 12/06/2018  . Closed fracture of right femur, unspecified fracture morphology, initial encounter ([place]) 11/28/2018  . Acute diastolic CHF (congestive heart failure) ([place]) 11/28/2018  . Fracture, intertrochanteric, right femur ([place]) 11/28/2018  . Atrial fibrillation with rapid ventricular response ([place]) 11/25/2018  . Atrial fibrillation with RVR ([place]) 11/24/2018  . Chronic back pain 11/24/2018  . Anxiety and depression 11/24/2018  . Chronic constipation 05/03/2016  . Dysphagia 05/03/2016  . Cholecystitis with cholelithiasis 01/29/2014    Palliative Care Assessment & Plan   Patient Profile: 85 y.o. female  with past medical history of a fib, chronic back pain, GERD, hip fracture s/p surgical repair,  admitted on 05/02/2019 with CHF exacerbation. Per her daughter,  [name], she was at [facility] for rehab and was not given her lasix, and developed fluid overload. Palliative medicine consulted for [place].    Assessment/Recommendations/Plan   Continue current scope  Patient and family hopeful for d/c home- agree to Palliative f/u at home.   Goals of Care and Additional Recommendations:  Limitations on Scope of Treatment: Full Scope Treatment  Code Status:  Full code  Prognosis:   Unable to determine  Discharge Planning:  Home with Palliative Services  Care plan was discussed with patient and daughter.   Thank you for allowing the Palliative Medicine Team to assist in the care of this patient.   Time In: 1430 Time Out: 1505 Total Time 35 mins Prolonged Time Billed no      Greater than 50%  of this time was spent counseling and coordinating care related to the above assessment and plan.  Mariana Kaufman, AGNP-C Palliative Medicine   Please contact Palliative Medicine Team phone at [phone] for questions and concerns.

## 2019-05-08 NOTE — Progress Notes (Signed)
Patient has order for [facility] hose. Patient attempted [facility] for 30 minutes then stated they made her legs hurt and she "wanted it off" Ted hose has been removed. Patient was reeducated on the use of [facility]. Patient continues to refuse.

## 2019-05-09 LAB — BASIC METABOLIC PANEL
Anion gap: 11 (ref 5–15)
BUN: 33 mg/dL — ABNORMAL HIGH (ref 8–23)
CO2: 33 mmol/L — ABNORMAL HIGH (ref 22–32)
Calcium: 9.6 mg/dL (ref 8.9–10.3)
Chloride: 94 mmol/L — ABNORMAL LOW (ref 98–111)
Creatinine, Ser: 1.06 mg/dL — ABNORMAL HIGH (ref 0.44–1.00)
GFR calc Af Amer: 56 mL/min — ABNORMAL LOW (ref >60)
GFR calc non Af Amer: 48 mL/min — ABNORMAL LOW (ref >60)
Glucose, Bld: 122 mg/dL — ABNORMAL HIGH (ref 70–99)
Potassium: 4.1 mmol/L (ref 3.5–5.1)
Sodium: 138 mmol/L (ref 135–145)

## 2019-05-09 LAB — BRAIN NATRIURETIC PEPTIDE: B Natriuretic Peptide: 445 pg/mL — ABNORMAL HIGH (ref 0.0–100.0)

## 2019-05-09 MED ORDER — TORSEMIDE 20 MG PO TABS
20.0000 mg | ORAL_TABLET | Freq: Two times a day (BID) | ORAL | Status: DC
  Administered 2019-05-09 – 2019-05-10 (×3): 20 mg via ORAL
  Filled 2019-05-09 (×3): qty 1

## 2019-05-09 NOTE — Care Management Important Message (Signed)
Important Message  Patient Details  Name: Kristy Mckinney MRN: [id number] Date of Birth: 1932/07/06   Medicare Important Message Given:  Yes     Tommy Medal 05/09/2019, 11:50 AM

## 2019-05-09 NOTE — Progress Notes (Signed)
Subjective: She says she feels better.  No new complaints.  Her breathing is pretty good.  She has less swelling.  Objective: Vital signs in last 24 hours: Temp:  [98.2 F (36.8 C)-98.9 F (37.2 C)] 98.5 F (36.9 C) (10/30 0622) Pulse Rate:  [62-126] 103 (10/30 0636) Resp:  [17] 17 (10/30 0622) BP: (89-124)/(43-72) 116/72 (10/30 0636) SpO2:  [91 %-99 %] 98 % (10/30 0636) Weight:  [63.1 kg-64.7 kg] 63.1 kg (10/30 0622) Weight change: 0.227 kg Last BM Date: 05/08/19  Intake/Output from previous day: 10/29 0701 - 10/30 0700 In: 720 [P.O.:720] Out: 1250 [Urine:1250]  PHYSICAL EXAM General appearance: alert, cooperative and no distress Resp: clear to auscultation bilaterally Cardio: irregularly irregular rhythm GI: soft, non-tender; bowel sounds normal; no masses,  no organomegaly Extremities: She still has edema but it has decreased  Lab Results:  Results for orders placed or performed during the hospital encounter of 05/02/19 (from the past 48 hour(s))  Basic metabolic panel     Status: Abnormal   Collection Time: 05/08/19  6:25 AM  Result Value Ref Range   Sodium 139 135 - 145 mmol/L   Potassium 3.7 3.5 - 5.1 mmol/L   Chloride 94 (L) 98 - 111 mmol/L   CO2 34 (H) 22 - 32 mmol/L   Glucose, Bld 109 (H) 70 - 99 mg/dL   BUN 35 (H) 8 - 23 mg/dL   Creatinine, Ser 1.06 (H) 0.44 - 1.00 mg/dL   Calcium 9.2 8.9 - 10.3 mg/dL   GFR calc non Af Amer 48 (L) >60 mL/min   GFR calc Af Amer 56 (L) >60 mL/min   Anion gap 11 5 - 15    Comment: Performed at [facility], [address] [place], [place] [id number]  Basic metabolic panel     Status: Abnormal   Collection Time: 05/09/19  5:42 AM  Result Value Ref Range   Sodium 138 135 - 145 mmol/L   Potassium 4.1 3.5 - 5.1 mmol/L   Chloride 94 (L) 98 - 111 mmol/L   CO2 33 (H) 22 - 32 mmol/L   Glucose, Bld 122 (H) 70 - 99 mg/dL   BUN 33 (H) 8 - 23 mg/dL   Creatinine, Ser 1.06 (H) 0.44 - 1.00 mg/dL   Calcium 9.6 8.9 - 10.3 mg/dL   GFR  calc non Af Amer 48 (L) >60 mL/min   GFR calc Af Amer 56 (L) >60 mL/min   Anion gap 11 5 - 15    Comment: Performed at [facility], [address] [place], [place] [address]  Brain natriuretic peptide     Status: Abnormal   Collection Time: 05/09/19  5:42 AM  Result Value Ref Range   B Natriuretic Peptide 445.0 (H) 0.0 - 100.0 pg/mL    Comment: Performed at [facility], [address], [place], [place] [address]    ABGS No results for input(s): PHART, PO2ART, TCO2, HCO3 in the last 72 hours.  Invalid input(s): PCO2 CULTURES Recent Results (from the past 240 hour(s))  SARS CORONAVIRUS 2 (TAT 6-24 HRS) Nasopharyngeal Nasopharyngeal Swab     Status: None   Collection Time: 05/02/19  5:26 AM   Specimen: Nasopharyngeal Swab  Result Value Ref Range Status   SARS Coronavirus 2 NEGATIVE NEGATIVE Final    Comment: (NOTE) SARS-CoV-2 target nucleic acids are NOT DETECTED. The SARS-CoV-2 RNA is generally detectable in upper and lower respiratory specimens during the acute phase of infection. Negative results do not preclude SARS-CoV-2 infection, do  not rule out co-infections with other pathogens, and should not be used as the sole basis for treatment or other patient management decisions. Negative results must be combined with clinical observations, patient history, and epidemiological information. The expected result is Negative. Fact Sheet for Patients: SugarRoll.be Fact Sheet for Healthcare Providers: https://www.woods-mathews.com/ This test is not yet approved or cleared by the [place] FDA and  has been authorized for detection and/or diagnosis of SARS-CoV-2 by FDA under an Emergency Use Authorization (EUA). This EUA will remain  in effect (meaning this test can be used) for the duration of the COVID-19 declaration under Section 56 4(b)(1) of the Act, 21 U.S.C. section 360bbb-3(b)(1), unless the authorization is terminated or revoked  sooner. Performed at [facility], [place] [address], [place], [place] [address]    Studies/Results: No results found.  Medications:  Prior to Admission:  Medications Prior to Admission  Medication Sig Dispense Refill Last Dose  . acetaminophen (TYLENOL) 325 MG tablet Take 650 mg by mouth every 6 (six) hours as needed.   Past Month at Unknown time  . alendronate (FOSAMAX) 70 MG tablet Take 1 tablet (70 mg total) by mouth once a week. Take with a full glass of water on an empty stomach. 4 tablet 0 04/28/2019  . apixaban (ELIQUIS) 5 MG TABS tablet Take 1 tablet (5 mg total) by mouth 2 (two) times daily. 60 tablet 5 05/01/2019 at 0900pm  . calcium-vitamin D (OS-CAL 500 + D) 500-200 MG-UNIT tablet Take 1 tablet by mouth 2 (two) times daily.   05/01/2019 at Unknown time  . diltiazem (CARDIZEM CD) 360 MG 24 hr capsule Take 1 capsule (360 mg total) by mouth daily. 30 capsule 5 05/01/2019 at Unknown time  . furosemide (LASIX) 40 MG tablet Take 1 tablet (40 mg total) by mouth daily. 90 tablet 3 05/01/2019 at Unknown time  . magnesium oxide (MAG-OX) 400 MG tablet Take 1 tablet (400 mg total) by mouth 3 (three) times daily. 90 tablet 0 05/01/2019 at Unknown time  . memantine (NAMENDA) 5 MG tablet Take 1 tablet (5 mg total) by mouth 2 (two) times daily. 60 tablet 0 05/01/2019 at Unknown time  . metoprolol tartrate (LOPRESSOR) 25 MG tablet Take 1 tablet (25 mg total) by mouth 2 (two) times daily. 60 tablet 5 05/01/2019 at 0900pm  . pantoprazole (PROTONIX) 40 MG tablet Take 1 tablet (40 mg total) by mouth daily. 30 tablet 3 05/01/2019 at Unknown time  . Polyethyl Glycol-Propyl Glycol (SYSTANE OP) Place 1 drop into both eyes daily as needed. Dry Eyes    unknown  . QUEtiapine (SEROQUEL) 25 MG tablet Take 1 tablet (25 mg total) by mouth at bedtime. 30 tablet 0 05/01/2019 at Unknown time  . sertraline (ZOLOFT) 50 MG tablet Take 1 tablet (50 mg total) by mouth every morning. 30 tablet 0 05/01/2019 at Unknown  time  . mirtazapine (REMERON) 7.5 MG tablet Take 1 tablet (7.5 mg total) by mouth at bedtime. (Patient not taking: Reported on 05/02/2019) 30 tablet 3 Not Taking at Unknown time  . NON FORMULARY Diet Type: NAS     . Ostomy Supplies (SKIN PREP WIPES) MISC Apply to bilateral heels every shift for prevention      Scheduled: . apixaban  5 mg Oral BID  . calcium-vitamin D  1 tablet Oral BID  . diltiazem  360 mg Oral Daily  . feeding supplement (ENSURE ENLIVE)  237 mL Oral BID BM  . furosemide  60 mg Intravenous  Q12H  . memantine  5 mg Oral BID  . metoprolol tartrate  25 mg Oral BID  . mirtazapine  7.5 mg Oral QHS  . multivitamin with minerals  1 tablet Oral Daily  . pantoprazole  40 mg Oral Daily  . potassium chloride  40 mEq Oral BID  . QUEtiapine  25 mg Oral QHS  . sertraline  50 mg Oral q morning - 10a  . sodium chloride flush  3 mL Intravenous Q12H   Continuous: . sodium chloride     [id number] chloride, acetaminophen, ondansetron (ZOFRAN) IV, sodium chloride flush, zolpidem  Assesment: She was admitted with acute on chronic diastolic heart failure.  She is diuresing much better now.  Her weight is down to about 138 which is very close to baseline.  She has chronic atrial fib and she has had some episodes of mildly increased heart rate associated with exertion but she is basically asymptomatic  She has dementia which is stable Active Problems:   Dementia (HCC)   Atrial fibrillation (HCC)   Acute on chronic diastolic congestive heart failure (HCC)   Acute on chronic diastolic CHF (congestive heart failure) ([place])   Advanced care planning/counseling discussion    Plan: Discontinue IV diuresis.  Switch to p.o. and if she does well home tomorrow    LOS: 7 days   Kristy Mckinney 05/09/2019, 7:43 AM

## 2019-05-09 NOTE — TOC Progression Note (Signed)
Transition of Care [facility]) - Progression Note    Patient Details  Name: Kristy Mckinney MRN: [id number] Date of Birth: 02/19/1932  [facility]) CM/SW Contact  Boneta Lucks, RN Phone Number: 05/09/2019, 10:43 AM  Clinical Narrative:   Planning for weekend discharge, [place] with [place] health updated and called Marcene Brawn to refer to [facility] for out patient palliative care.     Expected Discharge Plan: [place] Barriers to Discharge: Continued Medical Work up  Expected Discharge Plan and Services Expected Discharge Plan: [place] In-house Referral: Clinical Social Work     Living arrangements for the past 2 months: [place]: [place] ([place]) Date [place]: 05/05/19 Time [place]: 1138 Representative spoke with at [place]: Vaughan Basta L   Readmission Risk Interventions Readmission Risk Prevention Plan 05/05/2019 05/02/2019 11/27/2018  Post Dischage Appt - - Not Complete  Appt Comments - - cardiologist will call patient for appt.   Medication Screening - - Complete  Transportation Screening - Complete Complete  Medication Review Press photographer) - Complete -  [place] or Home Care Consult Complete - -  SW Recovery Care/Counseling Consult Complete - -  Palliative Care Screening Complete - -  [place] Not Applicable - -  Some recent data might be hidden

## 2019-05-09 NOTE — Progress Notes (Addendum)
Physical Therapy Treatment Patient Details Name: Kristy Mckinney MRN: [id number] DOB: 1932-03-28 Today's Date: 05/09/2019    History of Present Illness Kristy Mckinney is a 85 year old female with a history of permanent atrial fibrillation, anxiety, diastolic CHF, GERD presenting with 1 week history of shortness of breath.  Patient was recently admitted to the hospital from 04/10/2019 through 04/19/2019 for acute kidney injury secondary to gastroenteritis.  She was discharged from to [facility] where she was not initially restarted on her furosemide.  Her daughter contacted her cardiologist, and the patient received 3 days of furosemide at the facility after which she did not receive any further furosemide.  In the interim, the patient had noted increasing lower extremity edema, orthopnea, and dyspnea on exertion.  The patient had a visit with her cardiology office on 05/01/2019.  Admission was offered, but the patient did not want to be admitted at that time.  Unfortunately, she developed worsening shortness of breath resulting in her ED visit.  She denies any fevers, chills, chest pain, headache, coughing, hemoptysis, nausea, vomiting, diarrhea, abdominal pain.  Notably, the patient also had an admission earlier this calendar year from 11/28/2018 through 12/05/2018 during which she sustained a nondisplaced right intertrochanteric femur fracture requiring repair.In the ED, the patient was afebrile hemodynamically stable with oxygen saturation of 95% room air.  She was started on IV furosemide.  BMP and CBC were essentially unremarkable.  BNP was 473.  Chest x-ray showed bilateral pleural effusions and vascular congestion.    PT Comments    Pt able to perform all functional mobility without unsteadiness or assistance needed. Pt is functioning at modified independent with SPC, has progressed to meet goals, no longer requires verbal cues or assistance and is able to recall exercises without cues. Pt on  room air and O2 sat 96-97% with all functional mobility, no SOB noted. Pt is appropriate to d/c from skilled PT interventions at this time to care of nursing for ambulation daily as tolerated for length of stay.   Follow Up Recommendations  Home health PT;Supervision - Intermittent     Equipment Recommendations  None recommended by PT    Recommendations for Other Services       Precautions / Restrictions Precautions Precautions: None Restrictions Weight Bearing Restrictions: No    Mobility  Bed Mobility               General bed mobility comments: Pt seated at EOB upon arrival, just returned from restroom  Transfers Overall transfer level: Modified independent Equipment used: Straight cane Transfers: Sit to/from Stand Sit to Stand: Modified independent (Device/Increase time) Stand pivot transfers: Modified independent (Device/Increase time)       General transfer comment: no balance deficits, pt safe with SPC  Ambulation/Gait Ambulation/Gait assistance: Modified independent (Device/Increase time) Gait Distance (Feet): 100 Feet Assistive device: Straight cane Gait Pattern/deviations: Decreased step length - right;Decreased step length - left;Decreased stride length Gait velocity: slightly decreased   General Gait Details: pt with slightly decreased cadence, no LOS or unsteadiness noted with gait using SPC. Pt on room air and O2 sat 97-96% with amb   Stairs             Wheelchair Mobility    Modified Rankin (Stroke Patients Only)       Balance Overall balance assessment: Modified Independent  Cognition Arousal/Alertness: Awake/alert Behavior During Therapy: WFL for tasks assessed/performed Overall Cognitive Status: Within Functional Limits for tasks assessed                                        Exercises General Exercises - Lower Extremity Hip Flexion/Marching:  Standing;Both;10 reps(at sink countertop) Heel Raises: Standing;Both;10 reps(at sink countertop) Mini-Sqauts: Standing;10 reps(at sink countertop)    General Comments        Pertinent Vitals/Pain Pain Assessment: No/denies pain    Home Living                      Prior Function            PT Goals (current goals can now be found in the care plan section) Acute Rehab PT Goals Patient Stated Goal: return home PT Goal Formulation: With patient/family Time For Goal Achievement: 05/08/19 Potential to Achieve Goals: Good Progress towards PT goals: Goals met/education completed, patient discharged from PT    Frequency           PT Plan Other (comment)(Pt appropriate to d/c from skilled PT interventions at this time)    Co-evaluation              AM-PAC PT "6 Clicks" Mobility   Outcome Measure  Help needed turning from your back to your side while in a flat bed without using bedrails?: None Help needed moving from lying on your back to sitting on the side of a flat bed without using bedrails?: None Help needed moving to and from a bed to a chair (including a wheelchair)?: None Help needed standing up from a chair using your arms (e.g., wheelchair or bedside chair)?: None Help needed to walk in hospital room?: None Help needed climbing 3-5 steps with a railing? : None 6 Click Score: 24    End of Session   Activity Tolerance: Patient tolerated treatment well Patient left: in chair;with call bell/phone within reach;with nursing/sitter in room Nurse Communication: Mobility status PT Visit Diagnosis: Unsteadiness on feet (R26.81);Other abnormalities of gait and mobility (R26.89);Muscle weakness (generalized) (M62.81)     Time: 1510-1530 PT Time Calculation (min) (ACUTE ONLY): 20 min  Charges:  $Therapeutic Exercise: 8-22 mins                      Tori Yasmine Kilbourne PT, DPT 05/09/19, 3:40 PM [phone]

## 2019-05-10 LAB — BASIC METABOLIC PANEL
Anion gap: 11 (ref 5–15)
BUN: 30 mg/dL — ABNORMAL HIGH (ref 8–23)
CO2: 33 mmol/L — ABNORMAL HIGH (ref 22–32)
Calcium: 9.6 mg/dL (ref 8.9–10.3)
Chloride: 97 mmol/L — ABNORMAL LOW (ref 98–111)
Creatinine, Ser: 1.09 mg/dL — ABNORMAL HIGH (ref 0.44–1.00)
GFR calc Af Amer: 54 mL/min — ABNORMAL LOW (ref >60)
GFR calc non Af Amer: 47 mL/min — ABNORMAL LOW (ref >60)
Glucose, Bld: 119 mg/dL — ABNORMAL HIGH (ref 70–99)
Potassium: 4.1 mmol/L (ref 3.5–5.1)
Sodium: 141 mmol/L (ref 135–145)

## 2019-05-10 MED ORDER — TORSEMIDE 20 MG PO TABS: ORAL_TABLET | ORAL | 5 refills | Status: DC

## 2019-05-10 NOTE — TOC Transition Note (Addendum)
Transition of Care [facility]) - CM/SW Discharge Note   Patient Details  Name: Kristy Mckinney MRN: [id number] Date of Birth: 07/06/1932  Transition of Care [facility] CM/SW Contact:  Latanya Maudlin, RN Phone Number: 05/10/2019, 10:46 AM   Clinical Narrative:  Patient to be discharged per MD order. Orders in place for home health services. Patient was previously set up with home health through [place] care. Notified [place] of discharge. Outpatient palliative referral was given to [facility]. No further RNCM needs, family to transport.      Final next level of care: Home w Home Health Services Barriers to Discharge: No Barriers Identified   Patient Goals and CMS Choice   CMS Medicare.gov Compare Post Acute Care list provided to:: Patient Choice offered to / list presented to : Patient  Discharge Placement                       Discharge Plan and Services In-house Referral: Clinical Social Work                        HH Arranged: Therapist, sports, PT, Nurse's Aide [place] Agency: [place] ([facility]) Date [place]: 05/10/19 Time [place]: 77 Representative spoke with at [place]: [place] ([place]) Interventions     Readmission Risk Interventions Readmission Risk Prevention Plan 05/10/2019 05/05/2019 05/02/2019  Post Dischage Appt - - -  Appt Comments - - -  Medication Screening - - -  Transportation Screening Complete - Complete  Medication Review Press photographer) Complete - Complete  PCP or Specialist appointment within 3-5 days of discharge Complete - -  [place] or Home Care Consult Complete Complete -  SW Recovery Care/Counseling Consult Complete Complete -  Palliative Care Screening Complete Complete -  [place] Not Applicable Not Applicable -  Some recent data might be hidden

## 2019-05-10 NOTE — Progress Notes (Signed)
Subjective: She says she feels well.  She was able to get up and move around with physical therapy without any significant difficulty.  Weight is pending this morning.  She is eating her breakfast now.  Objective: Vital signs in last 24 hours: Temp:  [98.1 F (36.7 C)-99.3 F (37.4 C)] 98.1 F (36.7 C) (10/31 0556) Pulse Rate:  [72-107] 72 (10/31 0556) Resp:  [16-20] 18 (10/31 0556) BP: (102-115)/(57-70) 102/70 (10/31 0556) SpO2:  [91 %-98 %] 98 % (10/31 0831) Weight change:  Last BM Date: 05/09/19  Intake/Output from previous day: 10/30 0701 - 10/31 0700 In: 720 [P.O.:720] Out: 1900 [Urine:1900]  PHYSICAL EXAM General appearance: alert, cooperative and no distress Resp: clear to auscultation bilaterally Cardio: irregularly irregular rhythm GI: soft, non-tender; bowel sounds normal; no masses,  no organomegaly Extremities: extremities normal, atraumatic, no cyanosis or edema  Lab Results:  Results for orders placed or performed during the hospital encounter of 05/02/19 (from the past 48 hour(s))  Basic metabolic panel     Status: Abnormal   Collection Time: 05/09/19  5:42 AM  Result Value Ref Range   Sodium 138 135 - 145 mmol/L   Potassium 4.1 3.5 - 5.1 mmol/L   Chloride 94 (L) 98 - 111 mmol/L   CO2 33 (H) 22 - 32 mmol/L   Glucose, Bld 122 (H) 70 - 99 mg/dL   BUN 33 (H) 8 - 23 mg/dL   Creatinine, Ser 1.06 (H) 0.44 - 1.00 mg/dL   Calcium 9.6 8.9 - 10.3 mg/dL   GFR calc non Af Amer 48 (L) >60 mL/min   GFR calc Af Amer 56 (L) >60 mL/min   Anion gap 11 5 - 15    Comment: Performed at [facility], [address] [place], [place] [address]  Brain natriuretic peptide     Status: Abnormal   Collection Time: 05/09/19  5:42 AM  Result Value Ref Range   B Natriuretic Peptide 445.0 (H) 0.0 - 100.0 pg/mL    Comment: Performed at [facility], [address] [place], [place] [id number]  Basic metabolic panel     Status: Abnormal   Collection Time: 05/10/19  6:38 AM  Result  Value Ref Range   Sodium 141 135 - 145 mmol/L   Potassium 4.1 3.5 - 5.1 mmol/L   Chloride 97 (L) 98 - 111 mmol/L   CO2 33 (H) 22 - 32 mmol/L   Glucose, Bld 119 (H) 70 - 99 mg/dL   BUN 30 (H) 8 - 23 mg/dL   Creatinine, Ser 1.09 (H) 0.44 - 1.00 mg/dL   Calcium 9.6 8.9 - 10.3 mg/dL   GFR calc non Af Amer 47 (L) >60 mL/min   GFR calc Af Amer 54 (L) >60 mL/min   Anion gap 11 5 - 15    Comment: Performed at [facility], [address] [place], [place] [address]    ABGS No results for input(s): PHART, PO2ART, TCO2, HCO3 in the last 72 hours.  Invalid input(s): PCO2 CULTURES Recent Results (from the past 240 hour(s))  SARS CORONAVIRUS 2 (TAT 6-24 HRS) Nasopharyngeal Nasopharyngeal Swab     Status: None   Collection Time: 05/02/19  5:26 AM   Specimen: Nasopharyngeal Swab  Result Value Ref Range Status   SARS Coronavirus 2 NEGATIVE NEGATIVE Final    Comment: (NOTE) SARS-CoV-2 target nucleic acids are NOT DETECTED. The SARS-CoV-2 RNA is generally detectable in upper and lower respiratory specimens during the acute phase of infection. Negative results do not  preclude SARS-CoV-2 infection, do not rule out co-infections with other pathogens, and should not be used as the sole basis for treatment or other patient management decisions. Negative results must be combined with clinical observations, patient history, and epidemiological information. The expected result is Negative. Fact Sheet for Patients: SugarRoll.be Fact Sheet for Healthcare Providers: https://www.woods-mathews.com/ This test is not yet approved or cleared by the [place] FDA and  has been authorized for detection and/or diagnosis of SARS-CoV-2 by FDA under an Emergency Use Authorization (EUA). This EUA will remain  in effect (meaning this test can be used) for the duration of the COVID-19 declaration under Section 56 4(b)(1) of the Act, 21 U.S.C. section 360bbb-3(b)(1),  unless the authorization is terminated or revoked sooner. Performed at [facility], [place] [address], [place], [place] [address]    Studies/Results: No results found.  Medications:  Prior to Admission:  Medications Prior to Admission  Medication Sig Dispense Refill Last Dose  . acetaminophen (TYLENOL) 325 MG tablet Take 650 mg by mouth every 6 (six) hours as needed.   Past Month at Unknown time  . alendronate (FOSAMAX) 70 MG tablet Take 1 tablet (70 mg total) by mouth once a week. Take with a full glass of water on an empty stomach. 4 tablet 0 04/28/2019  . apixaban (ELIQUIS) 5 MG TABS tablet Take 1 tablet (5 mg total) by mouth 2 (two) times daily. 60 tablet 5 05/01/2019 at 0900pm  . calcium-vitamin D (OS-CAL 500 + D) 500-200 MG-UNIT tablet Take 1 tablet by mouth 2 (two) times daily.   05/01/2019 at Unknown time  . diltiazem (CARDIZEM CD) 360 MG 24 hr capsule Take 1 capsule (360 mg total) by mouth daily. 30 capsule 5 05/01/2019 at Unknown time  . furosemide (LASIX) 40 MG tablet Take 1 tablet (40 mg total) by mouth daily. 90 tablet 3 05/01/2019 at Unknown time  . magnesium oxide (MAG-OX) 400 MG tablet Take 1 tablet (400 mg total) by mouth 3 (three) times daily. 90 tablet 0 05/01/2019 at Unknown time  . memantine (NAMENDA) 5 MG tablet Take 1 tablet (5 mg total) by mouth 2 (two) times daily. 60 tablet 0 05/01/2019 at Unknown time  . metoprolol tartrate (LOPRESSOR) 25 MG tablet Take 1 tablet (25 mg total) by mouth 2 (two) times daily. 60 tablet 5 05/01/2019 at 0900pm  . pantoprazole (PROTONIX) 40 MG tablet Take 1 tablet (40 mg total) by mouth daily. 30 tablet 3 05/01/2019 at Unknown time  . Polyethyl Glycol-Propyl Glycol (SYSTANE OP) Place 1 drop into both eyes daily as needed. Dry Eyes    unknown  . QUEtiapine (SEROQUEL) 25 MG tablet Take 1 tablet (25 mg total) by mouth at bedtime. 30 tablet 0 05/01/2019 at Unknown time  . sertraline (ZOLOFT) 50 MG tablet Take 1 tablet (50 mg total) by  mouth every morning. 30 tablet 0 05/01/2019 at Unknown time  . mirtazapine (REMERON) 7.5 MG tablet Take 1 tablet (7.5 mg total) by mouth at bedtime. (Patient not taking: Reported on 05/02/2019) 30 tablet 3 Not Taking at Unknown time  . NON FORMULARY Diet Type: NAS     . Ostomy Supplies (SKIN PREP WIPES) MISC Apply to bilateral heels every shift for prevention      Scheduled: . apixaban  5 mg Oral BID  . calcium-vitamin D  1 tablet Oral BID  . diltiazem  360 mg Oral Daily  . feeding supplement (ENSURE ENLIVE)  237 mL Oral BID BM  . memantine  5 mg Oral BID  . metoprolol tartrate  25 mg Oral BID  . mirtazapine  7.5 mg Oral QHS  . multivitamin with minerals  1 tablet Oral Daily  . pantoprazole  40 mg Oral Daily  . potassium chloride  40 mEq Oral BID  . QUEtiapine  25 mg Oral QHS  . sertraline  50 mg Oral q morning - 10a  . sodium chloride flush  3 mL Intravenous Q12H  . torsemide  20 mg Oral BID   Continuous: . sodium chloride     [id number] chloride, acetaminophen, ondansetron (ZOFRAN) IV, sodium chloride flush, zolpidem  Assesment: She was admitted with acute on chronic diastolic heart failure.  She is down about 7 L since admission.  Weight is pending but it is come down as well.  She was 72 kg on admission.  She has chronic atrial fib and she is anticoagulated  She had an episode of renal failure and that has improved  She has dementia at baseline which is stable Active Problems:   Dementia (HCC)   Atrial fibrillation (HCC)   Acute on chronic diastolic congestive heart failure (HCC)   Acute on chronic diastolic CHF (congestive heart failure) ([place])   Advanced care planning/counseling discussion    Plan: Likely discharge today.  I had like to see her weight on oral meds.  That will be obtained when she finishes her breakfast    LOS: 8 days   Kristy Mckinney 05/10/2019, 9:05 AM

## 2019-05-10 NOTE — Discharge Summary (Signed)
Physician Discharge Summary  Patient ID: Kristy Mckinney MRN: [id number] DOB/AGE: 1931/12/20 85 y.o. Primary Care Physician:Nijah Orlich, Percell Miller, MD Admit date: 05/02/2019 Discharge date: 05/10/2019    Discharge Diagnoses:   Active Problems:   Dementia [facility])   Atrial fibrillation (HCC)   Acute on chronic diastolic congestive heart failure (HCC)   Acute on chronic diastolic CHF (congestive heart failure) ([place])   Advanced care planning/counseling discussion   Allergies as of 05/10/2019      Reactions   Penicillins Anaphylaxis   Emetrol Nausea Only   swelling   Feldene [piroxicam] Nausea And Vomiting   Sulfa Antibiotics    swelling      Medication List    STOP taking these medications   furosemide 40 MG tablet Commonly known as: LASIX     TAKE these medications   acetaminophen 325 MG tablet Commonly known as: TYLENOL Take 650 mg by mouth every 6 (six) hours as needed.   alendronate 70 MG tablet Commonly known as: FOSAMAX Take 1 tablet (70 mg total) by mouth once a week. Take with a full glass of water on an empty stomach.   apixaban 5 MG Tabs tablet Commonly known as: ELIQUIS Take 1 tablet (5 mg total) by mouth 2 (two) times daily.   calcium-vitamin D 500-200 MG-UNIT tablet Commonly known as: Os-Cal 500 + D Take 1 tablet by mouth 2 (two) times daily.   diltiazem 360 MG 24 hr capsule Commonly known as: CARDIZEM CD Take 1 capsule (360 mg total) by mouth daily.   magnesium oxide 400 MG tablet Commonly known as: MAG-OX Take 1 tablet (400 mg total) by mouth 3 (three) times daily.   memantine 5 MG tablet Commonly known as: NAMENDA Take 1 tablet (5 mg total) by mouth 2 (two) times daily.   metoprolol tartrate 25 MG tablet Commonly known as: LOPRESSOR Take 1 tablet (25 mg total) by mouth 2 (two) times daily.   mirtazapine 7.5 MG tablet Commonly known as: Remeron Take 1 tablet (7.5 mg total) by mouth at bedtime.   NON FORMULARY Diet Type: NAS    pantoprazole 40 MG tablet Commonly known as: PROTONIX Take 1 tablet (40 mg total) by mouth daily.   QUEtiapine 25 MG tablet Commonly known as: SEROQUEL Take 1 tablet (25 mg total) by mouth at bedtime.   sertraline 50 MG tablet Commonly known as: ZOLOFT Take 1 tablet (50 mg total) by mouth every morning.   Skin Prep Wipes Misc Apply to bilateral heels every shift for prevention   SYSTANE OP Place 1 drop into both eyes daily as needed. Dry Eyes   torsemide 20 MG tablet Commonly known as: DEMADEX Take 1 tablet by mouth daily.  If your weight goes up as much as 3 pounds take 2 daily       Discharged Condition: Improved    Consults: Palliative care/cardiology  Significant Diagnostic Studies: Ct Abdomen Pelvis Wo Contrast  Result Date: 04/13/2019 CLINICAL DATA:  85 year old female with history of abdominal distension and, nausea and vomiting. EXAM: CT ABDOMEN AND PELVIS WITHOUT CONTRAST TECHNIQUE: Multidetector CT imaging of the abdomen and pelvis was performed following the standard protocol without IV contrast. COMPARISON:  No priors. FINDINGS: Lower chest: Large right and moderate left pleural effusions lying dependently with extensive areas of passive atelectasis in the lung bases bilaterally. Mild cardiomegaly. Atherosclerotic calcifications in the descending thoracic aorta as well as the left main, left anterior descending, left circumflex and right coronary arteries. Severe calcifications of the aortic valve  and mitral annulus. Moderate to large hiatal hernia Hepatobiliary: No definite suspicious cystic or solid hepatic lesions are confidently identified on today's noncontrast CT examination. Status post cholecystectomy. Pancreas: No definite pancreatic mass. Mild peripancreatic stranding. Spleen: Trace amount of perisplenic fluid, otherwise unremarkable. Adrenals/Urinary Tract: 2 mm nonobstructive calculus in the upper pole collecting system of the right kidney and 3 mm  nonobstructive calculus in the lower pole collecting system of the left kidney. Unenhanced appearance of the kidneys and bilateral adrenal glands is otherwise unremarkable. No hydroureteronephrosis. Urinary bladder is nearly decompressed, but otherwise unremarkable in appearance. Stomach/Bowel: Normal appearance of the stomach. Mural thickening in the duodenum, most evident in the second portion of the duodenum, with surrounding inflammatory changes. No pathologic dilatation of small bowel or colon. Numerous colonic diverticulae are noted, particularly in the sigmoid colon. Appendix is not confidently identified, likely surgically absent. Vascular/Lymphatic: Aortic atherosclerosis. No lymphadenopathy noted in the abdomen or pelvis. Reproductive: Status post hysterectomy. Ovaries are not confidently identified and may be surgically absent or atrophic. Other: Small volume of ascites.  No pneumoperitoneum. Musculoskeletal: Chronic appearing compression fracture of L4 with 70% loss of central vertebral body height. There are no aggressive appearing lytic or blastic lesions noted in the visualized portions of the skeleton. Postoperative changes of ORIF in the proximal right femur. IMPRESSION: 1. Inflammatory changes centered around the pancreas and duodenum, concerning for acute pancreatitis and/or acute duodenitis. 2. Small volume of ascites. 3. Large right and moderate left pleural effusions lying dependently with some associated passive atelectasis in the lung bases bilaterally. 4. Tiny 2-3 mm nonobstructive calculi in the collecting systems of both kidneys. No ureteral stones or findings of urinary tract obstruction are noted at this time. 5. Mild cardiomegaly. 6. Aortic atherosclerosis, in addition to left main and 3 vessel coronary artery disease. 7. There are calcifications of the aortic valve and mitral annulus. Echocardiographic correlation for evaluation of potential valvular dysfunction may be warranted if  clinically indicated. 8. Moderate to large hiatal hernia. 9. Additional incidental findings, as above. Electronically Signed   By: Vinnie Langton M.D.   On: 04/13/2019 16:20   Dg Chest Port 1 View  Result Date: 05/02/2019 CLINICAL DATA:  Shortness of breath and difficulty urinating EXAM: PORTABLE CHEST 1 VIEW COMPARISON:  04/13/2019 FINDINGS: Layering pleural effusions that are slightly greater on the right, with interstitial and hazy opacity at the bases. Cardiomegaly. Cephalized blood flow. No pneumothorax IMPRESSION: Bilateral pleural effusion and edema/vascular congestion. No significant change since study earlier this month. Electronically Signed   By: Monte Fantasia M.D.   On: 05/02/2019 05:56   Dg Chest Port 1 View  Result Date: 04/13/2019 CLINICAL DATA:  Shortness of breath, abdominal pain EXAM: PORTABLE CHEST 1 VIEW COMPARISON:  04/04/2019 FINDINGS: Slight interval increase in layering right pleural effusion and associated atelectasis or consolidation. There may be an additional small left pleural effusion. Mild diffuse interstitial pulmonary opacity, likely edema, similar prior examination. No focal airspace opacity. Cardiomegaly. IMPRESSION: 1. Slight interval increase in layering right pleural effusion and associated atelectasis or consolidation. There may be an additional small left pleural effusion. 2. Mild diffuse interstitial pulmonary opacity, likely edema, similar to prior examination. No focal airspace opacity. 3.  Cardiomegaly. Electronically Signed   By: Eddie Candle M.D.   On: 04/13/2019 14:48    Lab Results: Basic Metabolic Panel: Recent Labs    05/09/19 0542 05/10/19 0638  NA 138 141  K 4.1 4.1  CL 94* 97*  CO2 33*  33*  GLUCOSE 122* 119*  BUN 33* 30*  CREATININE 1.06* 1.09*  CALCIUM 9.6 9.6   Liver Function Tests: No results for input(s): AST, ALT, ALKPHOS, BILITOT, PROT, ALBUMIN in the last 72 hours.   CBC: No results for input(s): WBC, NEUTROABS, HGB, HCT,  MCV, PLT in the last 72 hours.  Recent Results (from the past 240 hour(s))  SARS CORONAVIRUS 2 (TAT 6-24 HRS) Nasopharyngeal Nasopharyngeal Swab     Status: None   Collection Time: 05/02/19  5:26 AM   Specimen: Nasopharyngeal Swab  Result Value Ref Range Status   SARS Coronavirus 2 NEGATIVE NEGATIVE Final    Comment: (NOTE) SARS-CoV-2 target nucleic acids are NOT DETECTED. The SARS-CoV-2 RNA is generally detectable in upper and lower respiratory specimens during the acute phase of infection. Negative results do not preclude SARS-CoV-2 infection, do not rule out co-infections with other pathogens, and should not be used as the sole basis for treatment or other patient management decisions. Negative results must be combined with clinical observations, patient history, and epidemiological information. The expected result is Negative. Fact Sheet for Patients: SugarRoll.be Fact Sheet for Healthcare Providers: https://www.woods-mathews.com/ This test is not yet approved or cleared by the [place] FDA and  has been authorized for detection and/or diagnosis of SARS-CoV-2 by FDA under an Emergency Use Authorization (EUA). This EUA will remain  in effect (meaning this test can be used) for the duration of the COVID-19 declaration under Section 56 4(b)(1) of the Act, 21 U.S.C. section 360bbb-3(b)(1), unless the authorization is terminated or revoked sooner. Performed at [facility], [place] [address], [place],  [address]      Hospital Course: This is an 85 year old recent multiple hospitalizations in the last year.  She had been in the hospital and was felt to need rehab went to the rehab facility but developed increasing swelling of her legs while she was there.  She contacted my office and I contacted her skilled care facility where I do not have privileges and asked that she be evaluated.  I am not sure if she got started on any  diuretics there or not.  She eventually was discharged and had a visit with cardiology and her medications were adjusted.  However she continued to have trouble and came to the emergency department.  She has diuresed nicely and is now lost about 10 kg.  Her weight is 61.23 on stand-up scale was on the day of discharge she feels better.  Her renal function has not deteriorated significantly  Discharge Exam: Blood pressure 102/70, pulse 72, temperature 98.1 F (36.7 C), temperature source Oral, resp. rate 18, height 5' 4.5" (1.638 m), weight 61.2 kg, SpO2 98 %. She is awake and alert.  She is in atrial fib.  She has swelling of both legs but much less than on admission and now only trace edema  Disposition: Home with home health services  Discharge Instructions    Face-to-face encounter (required for Medicare/Medicaid patients)   Complete by: As directed    I Alonza Bogus certify that this patient is under my care and that I, or a nurse practitioner or physician's assistant working with me, had a face-to-face encounter that meets the physician face-to-face encounter requirements with this patient on 05/10/2019. The encounter with the patient was in whole, or in part for the following medical condition(s) which is the primary reason for home health care (List medical condition): Acute on chronic diastolic heart failure   The encounter  with the patient was in whole, or in part, for the following medical condition, which is the primary reason for home health care: Acute on chronic diastolic heart failure   I certify that, based on my findings, the following services are medically necessary home health services:  Nursing Physical therapy     Reason for Medically Necessary Home Health Services: Skilled Nursing- Change/Decline in Patient Status   My clinical findings support the need for the above services: Shortness of breath with activity   Further, I certify that my clinical findings support that  this patient is homebound due to: Shortness of Breath with activity   Home Health   Complete by: As directed    To provide the following care/treatments:  RN PT        Follow-up [place], [place] Follow up.   Specialty: Home Health Services Why:  Home Health for RN/PT/OT/ST       [facility] Follow up.   Why: Palliative care at home. Contact information: [place] [phone]          Signed: Alonza Bogus   05/10/2019, 9:57 AM

## 2019-05-11 DIAGNOSIS — K5909 Other constipation: Secondary | ICD-10-CM | POA: Diagnosis not present

## 2019-05-11 DIAGNOSIS — Z8781 Personal history of (healed) traumatic fracture: Secondary | ICD-10-CM | POA: Diagnosis not present

## 2019-05-11 DIAGNOSIS — I5033 Acute on chronic diastolic (congestive) heart failure: Secondary | ICD-10-CM | POA: Diagnosis not present

## 2019-05-11 DIAGNOSIS — Z9181 History of falling: Secondary | ICD-10-CM | POA: Diagnosis not present

## 2019-05-11 DIAGNOSIS — G8929 Other chronic pain: Secondary | ICD-10-CM | POA: Diagnosis not present

## 2019-05-11 DIAGNOSIS — F0391 Unspecified dementia with behavioral disturbance: Secondary | ICD-10-CM | POA: Diagnosis not present

## 2019-05-11 DIAGNOSIS — M810 Age-related osteoporosis without current pathological fracture: Secondary | ICD-10-CM | POA: Diagnosis not present

## 2019-05-11 DIAGNOSIS — M549 Dorsalgia, unspecified: Secondary | ICD-10-CM | POA: Diagnosis not present

## 2019-05-11 DIAGNOSIS — Z7901 Long term (current) use of anticoagulants: Secondary | ICD-10-CM | POA: Diagnosis not present

## 2019-05-11 DIAGNOSIS — I350 Nonrheumatic aortic (valve) stenosis: Secondary | ICD-10-CM | POA: Diagnosis not present

## 2019-05-11 DIAGNOSIS — I4821 Permanent atrial fibrillation: Secondary | ICD-10-CM | POA: Diagnosis not present

## 2019-05-11 DIAGNOSIS — F418 Other specified anxiety disorders: Secondary | ICD-10-CM | POA: Diagnosis not present

## 2019-05-11 DIAGNOSIS — K219 Gastro-esophageal reflux disease without esophagitis: Secondary | ICD-10-CM | POA: Diagnosis not present

## 2019-05-13 ENCOUNTER — Telehealth: Payer: Self-pay

## 2019-05-13 NOTE — Telephone Encounter (Signed)
Telephone call to patients daughter [name] to schedule palliative care visit.  Daughter in agreement with palliative care team making visit 05-19-19 a 12:00 PM.

## 2019-05-19 ENCOUNTER — Other Ambulatory Visit: Payer: PPO

## 2019-05-19 ENCOUNTER — Other Ambulatory Visit: Payer: Self-pay

## 2019-05-19 DIAGNOSIS — Z515 Encounter for palliative care: Secondary | ICD-10-CM

## 2019-05-19 NOTE — Progress Notes (Signed)
COMMUNITY PALLIATIVE CARE SW NOTE  PATIENT NAME: Kristy Mckinney DOB: 1931-09-25 MRN: [id number]  PRIMARY CARE PROVIDER: [name], MD  RESPONSIBLE PARTY:  Acct ID - Guarantor Home Phone Work Phone Relationship Acct Type  [id number] Webb [phone]  Self P/F     [place], [place], [place] [address]     PLAN OF CARE and INTERVENTIONS:             1. GOALS OF CARE/ ADVANCE CARE PLANNING:  Patient is a FULL CODE. Patient has a MOST form complete. HCPOA is daughter, Kristy Mckinney. Goal is to maintain her independence and "get back to how she was". 2. SOCIAL/EMOTIONAL/SPIRITUAL ASSESSMENT/ INTERVENTIONS:  SW and RN met with patient in the apartment. Patient reports doing "pretty good". Discussed recent hospitalization and brief medical history. Patient is sleeping well and noted good appetite. Patient said she is watching her diet and limiting salt intake. Patient enjoys crocheting and doing crosswords. Patient is a widow. 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 Patient has three adult children, and seven great grandchildren. Patient has a few neighbors that she has good relationships with and they support one another.  3. PATIENT/CAREGIVER EDUCATION/ COPING:  Patient is alert, engaged. Patient was pleasant. Patient has strong family and friend support. No concerns.  4. PERSONAL EMERGENCY PLAN:  Family will call 9-1-1 for emergencies. Patient wears an alert necklace that she can push for staff at the apartments for emergencies. 5. COMMUNITY RESOURCES COORDINATION/ HEALTH CARE NAVIGATION:  Patient has an appointment scheduled with cardiology on 11/12. Patient is going to contact PCP to schedule hospital follow-up. Patient is receiving home health nursing with [facility].  6. FINANCIAL/LEGAL CONCERNS/INTERVENTIONS:  None.     SOCIAL HX:   Social History   Tobacco Use  . Smoking status: Never Smoker  . Smokeless tobacco: Never Used  Substance Use Topics  . Alcohol use: No    CODE STATUS:   Code Status: Prior (FULL CODE) ADVANCED DIRECTIVES: Y MOST FORM COMPLETE:  Yes. HOSPICE EDUCATION PROVIDED: None.  PPS: Patient is independent of ADLs.   I spent 45 minutes with patient/family, from 12:00-12:45p providing education, support and consultation.    Margaretmary Lombard, LCSW

## 2019-05-19 NOTE — Progress Notes (Signed)
PATIENT NAME: Kristy Mckinney DOB: 1931/12/20 MRN: [id number]  PRIMARY CARE PROVIDER: [name], MD  RESPONSIBLE PARTY:  Acct ID - Guarantor Home Phone Work Phone Relationship Acct Type  [id number] Webb [phone]  Self P/F     [address], [place], [place] [address]    PLAN OF CARE and INTERVENTIONS:               1.  GOALS OF CARE/ ADVANCE CARE PLANNING:  Remain in her senior living apartment and be able to spend time with her family.               2.  PATIENT/CAREGIVER EDUCATION:  Education on palliative care services, education on foods low in sodium, reviewed meds, support                3.  DISEASE STATUS: SW and RN made  joint home care visit. Palliative care services explained patient in agreement with palliative care services.  . Patient has had multiple hospitalizations this year. Patient has CHF with Afib and has fluid overload with increasing shortness of breath which causes hospitalizations. Patient reports weighing 161 lbs at hospitalization due to fluid overload and weight today is 133.6 lbs. Patient alert and oriented and informs palliative care team she has 3 children who live close by. Patients goal is to be able to function at her previous level and spend time with her family especially her grandchildren. Patient denies pain at the present time. Patient currently receiving nursing services through [facility]. Patient reports she has received PT in the past and has been doing her exercises 3 times per day. Patient reports she has been taking her medications as directed and has been watching her salt intake. Nurse provides education to patient on need to monitor foods to ensure foods are low in sodium.  Patient denies suffering any recent falls. Patient reports if she goes out she uses her cane.  Patient's family is very supportive and patient has friends who live at  apartments that check on patient frequently. Patient's breath sounds are clear and patient  denies having any cough. Patient reports her appetite is good. Patient is able to perform her own ADL's.  Patient is not currently on home oxygen. Patient has trace edema in her lower extremities. Nurse reviews patient's medications with patient. Nurse provide education on fall precautions. Patient has a MOST form in the home and wants limited CPR resuscitation attempts. Palliative care contact information given to patient. Patient verbalizes appreciation for visit. Patient encouraged to contact palliative care with questions or concerns. :   HISTORY OF PRESENT ILLNESS:  Patient is a 85 year old female with heart disease.  Patient resides in senior living apartment.  Patient will be seen monthly and prn by PMPM team.  CODE STATUS: Full Code  ADVANCED DIRECTIVES: Y MOST FORM: Yes PPS: 60%   PHYSICAL EXAM:   VITALS: Today's Vitals   05/19/19 1222  BP: 98/62  Pulse: 82  Resp: 18  Temp: (!) 97 F (36.1 C)  TempSrc: Temporal  SpO2: 98%  Weight: 133 lb (60.3 kg)  Height: 5\' 5"  (1.651 m)  PainSc: 0-No pain    LUNGS: clear to auscultation  CARDIAC: Cor RRR  EXTREMITIES: Trace edema SKIN: Skin color, texture, turgor normal. No rashes or lesions  NEURO: positive for memory problems       Kristy Simmer, RN

## 2019-05-22 ENCOUNTER — Other Ambulatory Visit: Payer: Self-pay

## 2019-05-22 ENCOUNTER — Encounter: Payer: Self-pay | Admitting: Student

## 2019-05-22 ENCOUNTER — Ambulatory Visit (INDEPENDENT_AMBULATORY_CARE_PROVIDER_SITE_OTHER): Payer: PPO | Admitting: Student

## 2019-05-22 VITALS — BP 103/54 | HR 68 | Temp 97.1°F | Ht 64.5 in | Wt 135.4 lb

## 2019-05-22 DIAGNOSIS — I5033 Acute on chronic diastolic (congestive) heart failure: Secondary | ICD-10-CM

## 2019-05-22 DIAGNOSIS — I4821 Permanent atrial fibrillation: Secondary | ICD-10-CM | POA: Diagnosis not present

## 2019-05-22 DIAGNOSIS — Z79899 Other long term (current) drug therapy: Secondary | ICD-10-CM | POA: Diagnosis not present

## 2019-05-22 NOTE — Patient Instructions (Signed)
Medication Instructions:  Your physician recommends that you continue on your current medications as directed. Please refer to the Current Medication list given to you today.  *If you need a refill on your cardiac medications before your next appointment, please call your pharmacy*  Lab Work: BMET now If you have labs (blood work) drawn today and your tests are completely normal, you will receive your results only by: Marland Kitchen MyChart Message (if you have MyChart) OR . A paper copy in the mail If you have any lab test that is abnormal or we need to change your treatment, we will call you to review the results.  Testing/Procedures: None today  Follow-Up: At [facility], you and your health needs are our priority.  As part of our continuing mission to provide you with exceptional heart care, we have created designated Provider Care Teams.  These Care Teams include your primary Cardiologist (physician) and Advanced Practice Providers (APPs -  Physician Assistants and Nurse Practitioners) who all work together to provide you with the care you need, when you need it.  Your next appointment:    2-3 months follow up  The format for your next appointment:   In Person  Provider:   Bernerd Pho, PA-C  Other Instructions None     Thank you for choosing [facility] !

## 2019-05-22 NOTE — Progress Notes (Signed)
Cardiology Office Note    Date:  05/22/2019   ID:  Kristy Mckinney, DOB 1931-09-15, MRN [id number]  PCP:  [name], MD  Cardiologist: Rozann Lesches, MD    Chief Complaint   Acute on chronic diastolic heart failure   Atrial Fibrillation    History of Present Illness:    Kristy Mckinney is a 85 y.o. female with past medical history of permament atrial fibrillation, mild AS, chronic back pain, and recent issues with acute diastolic CHF following administration of IVF during admission for AKI  who presents for a hospital follow-up telehealth visit.   She was last examined by myself on 05/01/2019 and was still significantly volume overloaded and it was unclear if she had received Lasix while at SNF.  She was started on Lasix 40 mg daily but presented to the ED the following day for worsening dyspnea and a dry cough.  She was started on IV Lasix 60 mg twice daily and responded well with an overall output of -5.5 L.  Weight has been elevated to 161 LBS on admission but declined to 142 lbs prior to discharge. She was switched from Lasix to Torsemide 20 mg daily at the time of discharge.  Labs at the time of discharge showed a stable creatinine of 1.09 with K+ of 4.1.  Patient states she is doing well since being discharged from the hospital.  States her weight is ranging around 133 to 135 pound range. She weighs herself daily.  Denies any dyspnea on exertion or lower extremity edema.  States her edema has improved significantly.  Continues to take torsemide 20 mg daily  Denies any sensation of palpitations or rapid heart rates since discharge.  Heart rate is 68 today.   Past Medical History:  Diagnosis Date   Acute on chronic diastolic heart failure (HCC)    Atrial fibrillation (HCC)    Chronic back pain    Diarrhea    Dysphagia 05/03/2016   GERD (gastroesophageal reflux disease)     Past Surgical History:  Procedure Laterality Date   ABDOMINAL HYSTERECTOMY       BACK SURGERY     CHOLECYSTECTOMY N/A 01/29/2014   Procedure: LAPAROSCOPIC CHOLECYSTECTOMY;  Surgeon: Scherry Ran, MD;  Location: [facility];  Service: General;  Laterality: N/A;   COLONOSCOPY N/A 10/27/2015   Procedure: COLONOSCOPY;  Surgeon: Rogene Houston, MD;  Location: [facility];  Service: Endoscopy;  Laterality: N/A;  215   INTRAMEDULLARY (IM) NAIL INTERTROCHANTERIC Right 11/29/2018   Procedure: INTRAMEDULLARY (IM) NAIL INTERTROCHANTRIC FRACTURE;  Surgeon: Renette Butters, MD;  Location: [facility];  Service: Orthopedics;  Laterality: Right;   TONSILLECTOMY     YAG LASER APPLICATION Left [id number]   Procedure: YAG LASER APPLICATION;  Surgeon: Elta Guadeloupe T. Gershon Crane, MD;  Location: [facility];  Service: Ophthalmology;  Laterality: Left;  left    Current Medications: Outpatient Medications Prior to Visit  Medication Sig Dispense Refill   acetaminophen (TYLENOL) 325 MG tablet Take 650 mg by mouth every 6 (six) hours as needed.     alendronate (FOSAMAX) 70 MG tablet Take 1 tablet (70 mg total) by mouth once a week. Take with a full glass of water on an empty stomach. 4 tablet 0   apixaban (ELIQUIS) 5 MG TABS tablet Take 1 tablet (5 mg total) by mouth 2 (two) times daily. 60 tablet 5   calcium-vitamin D (OS-CAL 500 + D) 500-200 MG-UNIT tablet Take 1 tablet by mouth 2 (two) times daily.  diltiazem (CARDIZEM CD) 360 MG 24 hr capsule Take 1 capsule (360 mg total) by mouth daily. 30 capsule 5   magnesium oxide (MAG-OX) 400 MG tablet Take 1 tablet (400 mg total) by mouth 3 (three) times daily. 90 tablet 0   memantine (NAMENDA) 5 MG tablet Take 1 tablet (5 mg total) by mouth 2 (two) times daily. 60 tablet 0   metoprolol tartrate (LOPRESSOR) 25 MG tablet Take 1 tablet (25 mg total) by mouth 2 (two) times daily. 60 tablet 5   NON FORMULARY Diet Type: NAS     Ostomy Supplies (SKIN PREP WIPES) MISC Apply to bilateral heels every shift for prevention     pantoprazole (PROTONIX) 40 MG  tablet Take 1 tablet (40 mg total) by mouth daily. 30 tablet 3   Polyethyl Glycol-Propyl Glycol (SYSTANE OP) Place 1 drop into both eyes daily as needed. Dry Eyes      QUEtiapine (SEROQUEL) 25 MG tablet Take 1 tablet (25 mg total) by mouth at bedtime. 30 tablet 0   sertraline (ZOLOFT) 50 MG tablet Take 1 tablet (50 mg total) by mouth every morning. 30 tablet 0   torsemide (DEMADEX) 20 MG tablet Take 1 tablet by mouth daily.  If your weight goes up as much as 3 pounds take 2 daily 60 tablet 5   mirtazapine (REMERON) 7.5 MG tablet Take 1 tablet (7.5 mg total) by mouth at bedtime. (Patient not taking: Reported on 05/02/2019) 30 tablet 3   No facility-administered medications prior to visit.      Allergies:   Penicillins, Emetrol, Feldene [piroxicam], and Sulfa antibiotics   Social History   Socioeconomic History   Marital status: Widowed    Spouse name: Not on file   Number of children: Not on file   Years of education: Not on file   Highest education level: Not on file  Occupational History   Not on file  Social Needs   Financial resource strain: Not hard at all   Food insecurity    Worry: Never true    Inability: Never true   Transportation needs    Medical: No    Non-medical: No  Tobacco Use   Smoking status: Never Smoker   Smokeless tobacco: Never Used  Substance and Sexual Activity   Alcohol use: No   Drug use: No   Sexual activity: Yes    Birth control/protection: Surgical  Lifestyle   Physical activity    Days per week: 5 days    Minutes per session: 30 min   Stress: Not at all  Relationships   Social connections    Talks on phone: More than three times a week    Gets together: More than three times a week    Attends religious service: More than 4 times per year    Active member of club or organization: Yes    Attends meetings of clubs or organizations: More than 4 times per year    Relationship status: Widowed  Other Topics Concern   Not  on file  Social History Narrative   Pt lives in an apartment complex, reports that she still drives     Family History:  The patient's family history includes Hypertension in her child.   Review of Systems:   Please see the history of present illness.     General:  No chills, fever, night sweats or weight changes.  Cardiovascular:  No chest pain, no dyspnea on exertion, no orthopnea, no paroxysmal nocturnal dyspnea.  No  lower extremity edema Dermatological: No rash, lesions/masses Respiratory: No cough, dyspnea Urologic: No hematuria, dysuria Abdominal:   No nausea, vomiting, diarrhea, bright red blood per rectum, melena, or hematemesis Neurologic:  No visual changes, wkns, changes in mental status. All other systems reviewed and are otherwise negative except as noted above.   Physical Exam:    VS:  BP (!) 103/54    Pulse 68    Temp (!) 97.1 F (36.2 C) (Temporal)    Ht 5' 4.5" (1.638 m)    Wt 135 lb 6.4 oz (61.4 kg)    BMI 22.88 kg/m    General: Well developed, well nourished,female appearing in no acute distress. Head: Normocephalic, atraumatic, sclera non-icteric, no xanthomas, nares are without discharge.  Neck: No carotid bruits. JVD not elevated.  Lungs: Respirations regular and unlabored, without wheezes or rales.  Heart: irregularly irregular. No S3 or S4.  No murmur, no rubs, or gallops appreciated. Abdomen: Soft, non-tender, non-distended with normoactive bowel sounds. No hepatomegaly. No rebound/guarding. No obvious abdominal masses. Msk:  Strength and tone appear normal for age. No joint deformities or effusions. Extremities: No clubbing or cyanosis. No edema.  Distal pedal pulses are 2+ bilaterally. Neuro: Alert and oriented X 3. Moves all extremities spontaneously. No focal deficits noted. Psych:  Responds to questions appropriately with a normal affect. Skin: No rashes or lesions noted  Wt Readings from Last 3 Encounters:  05/22/19 135 lb 6.4 oz (61.4 kg)    05/19/19 133 lb (60.3 kg)  05/10/19 135 lb (61.2 kg)        Studies/Labs Reviewed:   EKG:  No ECG obtained today  Recent Labs: 04/04/2019: TSH 2.532 04/12/2019: Magnesium 2.1 04/14/2019: ALT 69 05/02/2019: Hemoglobin 11.1; Platelets 231 05/09/2019: B Natriuretic Peptide 445.0 05/10/2019: BUN 30; Creatinine, Ser 1.09; Potassium 4.1; Sodium 141   Lipid Panel No results found for: CHOL, TRIG, HDL, CHOLHDL, VLDL, LDLCALC, LDLDIRECT  Additional studies/ records that were reviewed today include:   Echocardiogram 04/04/2019 1. Left ventricular ejection fraction, by visual estimation, is 70 to 75%. The left ventricle has hyperdynamic function. Decreased left ventricular size. There is moderately increased left ventricular hypertrophy.  2. Left ventricular diastolic Doppler parameters are indeterminate pattern of LV diastolic filling.  3. Right ventricular volume and pressure overload.  4. Global right ventricle has normal systolic function.The right ventricular size is moderately enlarged. Mildly increased right ventricular wall thickness.  5. Left atrial size was severely dilated.  6. Right atrial size was normal.  7. Presence of pericardial fat pad.  8. Moderate aortic valve annular calcification.  9. Moderate to severe mitral annular calcification. 10. Severe calcification of the mitral valve leaflet(s). Valve appears stenotic, not completely evaluated. Suggest follow-up images with VTI measurements or planimetry if possible. 11. The mitral valve is degenerative. Mild mitral valve regurgitation. 12. The tricuspid valve is grossly normal. Tricuspid valve regurgitation is mild. 13. Aortic valve area, by VTI measures 1.83 cm. 14. Aortic valve mean gradient measures 8.8 mmHg. 15. The aortic valve is tricuspid Aortic valve regurgitation was not visualized by color flow Doppler. Mild aortic valve stenosis. 16. Moderately elevated pulmonary artery systolic pressure. 17. The tricuspid  regurgitant velocity is 3.67 m/s, and with an assumed right atrial pressure of 8 mmHg, the estimated right ventricular systolic pressure is moderately elevated at 61.9 mmHg. 18. The inferior vena cava is normal in size with <50% respiratory variability, suggesting right atrial pressure of 8 mmHg. 19. The pulmonic valve was grossly normal. Pulmonic valve  regurgitation is trivial by color flow Doppler. In comparison to the previous echocardiogram(s): Unable to compare with prior study. FINDINGS  Left Ventricle: Left ventricular ejection fraction, by visual estimation, is 70 to 75%. The left ventricle has hyperdynamic function. There is moderately increased left ventricular hypertrophy. Decreased left ventricular size. The interventricular  septum is flattened in systole and diastole, consistent with right ventricular pressure and volume overload. Spectral Doppler shows Left ventricular diastolic Doppler parameters are indeterminate pattern of LV diastolic filling.  Assessment:    1. Acute on chronic diastolic congestive heart failure ([facility])   2. Medication management   3. Permanent atrial fibrillation ([facility])      Plan:   In order of problems listed above:  1.Acute on chronic diastolic congestive HF. Recent hospitalization for acute on chronic diastolic heart failure secondary to IVF secondary AKI. Patient was diuresed in the hospital and lost significant amount of weight approximately 30 pounds.  Patient is weighing herself daily. Today's weight is 135 lbs.  Get BMP today.  Advised patient to continue weighing every day.  Call if weight gain from 3 to 5 pounds in 1 to 2 days.  Advised watching salt intake and excess fluid intake especially if she is gaining weight.  2. Permanent atrial fibrillation.  Heart rate irregularly irregular with a rate of 68 today.  Advised patient to monitor her heart rate and if she feels the sudden increase in heart rates to call the clinic.Continue Cardizem 360 mg  daily along with metoprolol 25 mg p.o. twice daily for rate control.  Continue Eliquis.  Denies any active bleeding.   Medication Adjustments/Labs and Tests Ordered: Current medicines are reviewed at length with the patient today.  Concerns regarding medicines are outlined above.  Medication changes, Labs and Tests ordered today are listed in the Patient Instructions below. Patient Instructions  Medication Instructions:  Your physician recommends that you continue on your current medications as directed. Please refer to the Current Medication list given to you today.  *If you need a refill on your cardiac medications before your next appointment, please call your pharmacy*  Lab Work: BMET now If you have labs (blood work) drawn today and your tests are completely normal, you will receive your results only by:  [facility] (if you have MyChart) OR  A paper copy in the mail If you have any lab test that is abnormal or we need to change your treatment, we will call you to review the results.  Testing/Procedures: None today  Follow-Up: At [facility], you and your health needs are our priority.  As part of our continuing mission to provide you with exceptional heart care, we have created designated Provider Care Teams.  These Care Teams include your primary Cardiologist (physician) and Advanced Practice Providers (APPs -  Physician Assistants and Nurse Practitioners) who all work together to provide you with the care you need, when you need it.  Your next appointment:    2-3 months follow up  The format for your next appointment:   In Person  Provider:   Bernerd Pho, PA-C  Other Instructions None     Thank you for choosing [facility] !            Signed, Verta Ellen, NP  05/22/2019 5:01 PM    [facility]. [address] [place], [place] [address] Phone: [phone] Fax: [phone]

## 2019-05-23 ENCOUNTER — Other Ambulatory Visit (HOSPITAL_COMMUNITY)
Admission: RE | Admit: 2019-05-23 | Discharge: 2019-05-23 | Disposition: A | Discharge status: Home / Self Care | Payer: PPO | Source: Ambulatory Visit | Attending: Family Medicine | Admitting: Family Medicine

## 2019-05-23 DIAGNOSIS — Z79899 Other long term (current) drug therapy: Secondary | ICD-10-CM | POA: Diagnosis not present

## 2019-05-23 DIAGNOSIS — I5033 Acute on chronic diastolic (congestive) heart failure: Secondary | ICD-10-CM | POA: Diagnosis not present

## 2019-05-23 LAB — BASIC METABOLIC PANEL
Anion gap: 12 (ref 5–15)
BUN: 35 mg/dL — ABNORMAL HIGH (ref 8–23)
CO2: 30 mmol/L (ref 22–32)
Calcium: 9.8 mg/dL (ref 8.9–10.3)
Chloride: 98 mmol/L (ref 98–111)
Creatinine, Ser: 1.37 mg/dL — ABNORMAL HIGH (ref 0.44–1.00)
GFR calc Af Amer: 41 mL/min — ABNORMAL LOW (ref >60)
GFR calc non Af Amer: 35 mL/min — ABNORMAL LOW (ref >60)
Glucose, Bld: 77 mg/dL (ref 70–99)
Potassium: 3.9 mmol/L (ref 3.5–5.1)
Sodium: 140 mmol/L (ref 135–145)

## 2019-05-27 ENCOUNTER — Telehealth: Payer: Self-pay

## 2019-05-27 DIAGNOSIS — Z79899 Other long term (current) drug therapy: Secondary | ICD-10-CM

## 2019-05-27 MED ORDER — TORSEMIDE 20 MG PO TABS: ORAL_TABLET | ORAL | 3 refills | Status: DC

## 2019-05-27 NOTE — Telephone Encounter (Signed)
-----   Message from Verta Ellen., NP sent at 05/27/2019  8:17 AM EST ----- Decrease torsemide to 10 mg daily.  If patient starts gaining 3 to 5 pounds in a day or 2 tell her to go back to the 20 mg dose.

## 2019-05-27 NOTE — Telephone Encounter (Signed)
Pt will decrease torsemide to 10 mg qd, mailed lab slip

## 2019-05-29 DIAGNOSIS — I482 Chronic atrial fibrillation, unspecified: Secondary | ICD-10-CM | POA: Diagnosis not present

## 2019-05-29 DIAGNOSIS — I10 Essential (primary) hypertension: Secondary | ICD-10-CM | POA: Diagnosis not present

## 2019-05-29 DIAGNOSIS — F0390 Unspecified dementia without behavioral disturbance: Secondary | ICD-10-CM | POA: Diagnosis not present

## 2019-05-29 DIAGNOSIS — I5032 Chronic diastolic (congestive) heart failure: Secondary | ICD-10-CM | POA: Diagnosis not present

## 2019-06-09 ENCOUNTER — Other Ambulatory Visit (HOSPITAL_COMMUNITY)
Admission: RE | Admit: 2019-06-09 | Discharge: 2019-06-09 | Disposition: A | Discharge status: Home / Self Care | Payer: PPO | Source: Ambulatory Visit | Attending: Student | Admitting: Student

## 2019-06-09 DIAGNOSIS — Z79899 Other long term (current) drug therapy: Secondary | ICD-10-CM | POA: Diagnosis not present

## 2019-06-09 LAB — BASIC METABOLIC PANEL
Anion gap: 12 (ref 5–15)
BUN: 20 mg/dL (ref 8–23)
CO2: 25 mmol/L (ref 22–32)
Calcium: 9.9 mg/dL (ref 8.9–10.3)
Chloride: 105 mmol/L (ref 98–111)
Creatinine, Ser: 1.02 mg/dL — ABNORMAL HIGH (ref 0.44–1.00)
GFR calc Af Amer: 58 mL/min — ABNORMAL LOW (ref >60)
GFR calc non Af Amer: 50 mL/min — ABNORMAL LOW (ref >60)
Glucose, Bld: 125 mg/dL — ABNORMAL HIGH (ref 70–99)
Potassium: 4.6 mmol/L (ref 3.5–5.1)
Sodium: 142 mmol/L (ref 135–145)

## 2019-06-10 ENCOUNTER — Telehealth: Payer: Self-pay | Admitting: Cardiology

## 2019-06-10 NOTE — Telephone Encounter (Signed)
Will forward to Myrla Halsted., as an Juluis Rainier.

## 2019-06-10 NOTE — Telephone Encounter (Signed)
Pt made aware of recent labs. She will take extra fluid pill as needed.

## 2019-06-10 NOTE — Telephone Encounter (Signed)
    Thank you for the update. Fine to do as recent labs (just resulted BMET) showed kidney function had improved.   Signed, Erma Heritage, PA-C 06/10/2019, 11:12 AM Pager: [phone]

## 2019-06-10 NOTE — Telephone Encounter (Signed)
Pt had a 3lb weight gain and was a little short of breath yesterday and took an extra pill and got rid of 2 lbs.  Wanted someone to be aware.

## 2019-06-12 ENCOUNTER — Other Ambulatory Visit: Payer: Self-pay

## 2019-06-12 ENCOUNTER — Other Ambulatory Visit: Payer: PPO

## 2019-06-12 DIAGNOSIS — Z515 Encounter for palliative care: Secondary | ICD-10-CM

## 2019-06-12 NOTE — Progress Notes (Signed)
PATIENT NAME: Kristy Mckinney DOB: Dec 04, 1931 MRN: [id number]  PRIMARY CARE PROVIDER: [name], MD  RESPONSIBLE PARTY:  Acct ID - Guarantor Home Phone Work Phone Relationship Acct Type  [id number] Webb [phone]  Self P/F     [place], [place], [place] [address]    PLAN OF CARE and INTERVENTIONS:               1.  GOALS OF CARE/ ADVANCE CARE PLANNING:  Remain in her apartment and function at optimal level.               2.  PATIENT/CAREGIVER EDUCATION:  Education on fall precautions, education on s/s of infection, reviewed meds, support               3.  DISEASE STATUS: RN made home visit to see patienttoday . Patient sitting on sofa watching TV and crocheting baby blanket for new great-grandchild.   Patient reports she had a 3-pound weight gain this week and called MD. MD instructed patient to take extra half tablet of Torsemide.  Patient denies pain at the present time. Patient reports she does have shortness of breath once in awhile. Patient states when this happens she sits down for a few minutes to rest. Patient not on home oxygen. Patient reports her appetite is good however patient states she does not like to be on heart healthy diet. Patient's vital signs are stable however heart rate is irregular. Patient states when she goes out she takes her rolling walker with her. Patient recently got license renewed and does drive short distances. Patient states she has been sleeping well. Patient has trace edema in lower extremities. Nurse reviewed patient's medications and patient has had no recent changes in meds.  . Patients MD Dr Luan Pulling is retiring. Patient states Benny Lennert MD will be following her. Nurse will update palliative care administration of patient changing providers.  Patient remains in agreement with palliative care services. Patient encouraged to contact palliative care team with questions or concerns.     HISTORY OF PRESENT ILLNESS:  Patient is a 85 year  old female who resides at home alone.  Patient is independent with personal care.  Patient seen monthly and PRN by PMP[facility] team.     CODE STATUS: Full Code   ADVANCED DIRECTIVES: Y MOST FORM: Y PPS: 60%   PHYSICAL EXAM:   VITALS: Today's Vitals   06/12/19 1200  BP: (!) 102/58  Pulse: 72  Resp: 18  Temp: 97.7 F (36.5 C)  TempSrc: Temporal  SpO2: 96%  Weight: 135 lb 6.4 oz (61.4 kg)  PainSc: 0-No pain    LUNGS: clear to auscultation  CARDIAC: Cor irreg, irreg RRR  EXTREMITIES: Trace edema SKIN: Skin color, texture, turgor normal. No rashes or lesions  NEURO: Alert and oriented x 3       Nilda Simmer, RN

## 2019-06-13 DIAGNOSIS — F0390 Unspecified dementia without behavioral disturbance: Secondary | ICD-10-CM | POA: Insufficient documentation

## 2019-06-13 DIAGNOSIS — M26602 Left temporomandibular joint disorder, unspecified: Secondary | ICD-10-CM | POA: Insufficient documentation

## 2019-06-13 DIAGNOSIS — E559 Vitamin D deficiency, unspecified: Secondary | ICD-10-CM

## 2019-06-13 DIAGNOSIS — S6290XA Unspecified fracture of unspecified wrist and hand, initial encounter for closed fracture: Secondary | ICD-10-CM | POA: Insufficient documentation

## 2019-06-13 DIAGNOSIS — R140 Abdominal distension (gaseous): Secondary | ICD-10-CM

## 2019-06-13 DIAGNOSIS — D649 Anemia, unspecified: Secondary | ICD-10-CM | POA: Insufficient documentation

## 2019-06-13 DIAGNOSIS — I10 Essential (primary) hypertension: Secondary | ICD-10-CM

## 2019-06-13 DIAGNOSIS — F419 Anxiety disorder, unspecified: Secondary | ICD-10-CM

## 2019-06-13 DIAGNOSIS — M810 Age-related osteoporosis without current pathological fracture: Secondary | ICD-10-CM | POA: Insufficient documentation

## 2019-06-13 DIAGNOSIS — K580 Irritable bowel syndrome with diarrhea: Secondary | ICD-10-CM

## 2019-06-13 DIAGNOSIS — L989 Disorder of the skin and subcutaneous tissue, unspecified: Secondary | ICD-10-CM

## 2019-06-13 DIAGNOSIS — D509 Iron deficiency anemia, unspecified: Secondary | ICD-10-CM | POA: Insufficient documentation

## 2019-06-13 DIAGNOSIS — K2100 Gastro-esophageal reflux disease with esophagitis, without bleeding: Secondary | ICD-10-CM

## 2019-07-10 ENCOUNTER — Telehealth: Payer: Self-pay

## 2019-07-10 NOTE — Telephone Encounter (Signed)
Telephone call to patients home to schedule palliative care visit.  Patient did not answer phone.  RN left message requesting call back to schedule visit time.

## 2019-07-15 ENCOUNTER — Telehealth: Payer: Self-pay

## 2019-07-15 NOTE — Telephone Encounter (Signed)
Telephone call to patients home to schedule palliative care visit.  Patient did not answer phone.  RN left message requesting call back to schedule visit.

## 2019-07-17 ENCOUNTER — Telehealth: Payer: Self-pay

## 2019-07-17 NOTE — Telephone Encounter (Signed)
RN made telephone call to patient to schedule palliative care visit. Patient did not answer phone, RN left message requesting call back to schedule palliative care visit.

## 2019-07-22 DIAGNOSIS — H5213 Myopia, bilateral: Secondary | ICD-10-CM | POA: Diagnosis not present

## 2019-07-22 DIAGNOSIS — Z961 Presence of intraocular lens: Secondary | ICD-10-CM | POA: Diagnosis not present

## 2019-07-22 DIAGNOSIS — H02401 Unspecified ptosis of right eyelid: Secondary | ICD-10-CM | POA: Diagnosis not present

## 2019-07-22 DIAGNOSIS — H524 Presbyopia: Secondary | ICD-10-CM | POA: Diagnosis not present

## 2019-07-22 DIAGNOSIS — H04123 Dry eye syndrome of bilateral lacrimal glands: Secondary | ICD-10-CM | POA: Diagnosis not present

## 2019-07-23 ENCOUNTER — Telehealth: Payer: Self-pay

## 2019-07-23 NOTE — Telephone Encounter (Signed)
Telephone call to patient to schedule palliative care visit with patient. Patient/family in agreement with home visit on 08-01-19 at 11:00AM.

## 2019-07-24 ENCOUNTER — Encounter: Payer: Self-pay | Admitting: Student

## 2019-07-24 ENCOUNTER — Other Ambulatory Visit: Payer: Self-pay

## 2019-07-24 ENCOUNTER — Ambulatory Visit (INDEPENDENT_AMBULATORY_CARE_PROVIDER_SITE_OTHER): Payer: PPO | Admitting: Student

## 2019-07-24 VITALS — BP 110/64 | HR 58 | Temp 98.6°F | Ht 64.5 in | Wt 136.4 lb

## 2019-07-24 DIAGNOSIS — I5032 Chronic diastolic (congestive) heart failure: Secondary | ICD-10-CM | POA: Diagnosis not present

## 2019-07-24 DIAGNOSIS — I4821 Permanent atrial fibrillation: Secondary | ICD-10-CM | POA: Diagnosis not present

## 2019-07-24 DIAGNOSIS — I350 Nonrheumatic aortic (valve) stenosis: Secondary | ICD-10-CM

## 2019-07-24 NOTE — Progress Notes (Signed)
Cardiology Office Note    Date:  07/25/2019   ID:  Kristy Mckinney, DOB 05-Aug-1931, MRN [id number]  PCP:  Patient, No Pcp Per  Cardiologist: Rozann Lesches, MD    Chief Complaint  Patient presents with  . Follow-up    2 month visit    History of Present Illness:    Kristy Mckinney is a 86 y.o. female past medical history of chronic diastolic CHF, permanent atrial fibrillation, mild AS, chronic back pain, and prior AKI who presents to the office today for 71-month follow-up.  She was last examined by Katina Dung, NP in 05/2019 following a recent admission for an acute CHF exacerbation during which she responded well with IV Lasix. Weight had been stable around 133 -135 lbs on her home scales and she was taking Torsemide 20mg  daily. Follow-up lab work showed her creatinine had trended upwards to 1.37, therefore she was advised to decrease Torsemide to 10 mg daily. Repeat BMET on 11/30 showed a return to baseline renal function with creatinine at 1.02.  In talking with the patient today, she reports overall doing well since her last visit. She has baseline dyspnea on exertion when performing household chores which has been occurring for several months now. Denies any associated chest pain or palpitations. She does mention pain along her shoulders bilaterally that can occur intermittently, sometimes when picking up heavier objects. She denies any specific orthopnea, PND or edema.   Weight has been stable on her home scales. She has only had to utilize an extra Torsemide tablet once or twice within the past month.    Past Medical History:  Diagnosis Date  . Abdominal distension (gaseous)   . Acute on chronic diastolic heart failure ([place])   . Age-related osteoporosis without current pathological fracture   . Anemia, unspecified   . Anxiety disorder, unspecified   . Atrial fibrillation ([place])   . Bronchitis, not specified as acute or chronic   . Chronic back pain   . Diarrhea   .  Disorder of the skin and subcutaneous tissue, unspecified   . Dysphagia 05/03/2016  . Essential (primary) hypertension   . Gastro-esophageal reflux disease with esophagitis   . GERD (gastroesophageal reflux disease)   . Iron deficiency anemia, unspecified   . Irritable bowel syndrome with diarrhea   . Left temporomandibular joint disorder, unspecified   . Lumbago   . Pneumonia   . Shortness of breath   . Unspecified dementia without behavioral disturbance ([place])   . Unspecified fracture of unspecified wrist and hand, initial encounter for closed fracture   . Vitamin D deficiency, unspecified     Past Surgical History:  Procedure Laterality Date  . ABDOMINAL HYSTERECTOMY    . BACK SURGERY    . CHOLECYSTECTOMY N/A 01/29/2014   Procedure: LAPAROSCOPIC CHOLECYSTECTOMY;  Surgeon: Scherry Ran, MD;  Location: [facility];  Service: General;  Laterality: N/A;  . COLONOSCOPY N/A 10/27/2015   Procedure: COLONOSCOPY;  Surgeon: Rogene Houston, MD;  Location: [facility];  Service: Endoscopy;  Laterality: N/A;  215  . INTRAMEDULLARY (IM) NAIL INTERTROCHANTERIC Right 11/29/2018   Procedure: INTRAMEDULLARY (IM) NAIL INTERTROCHANTRIC FRACTURE;  Surgeon: Renette Butters, MD;  Location: [facility];  Service: Orthopedics;  Laterality: Right;  . TONSILLECTOMY    . YAG LASER APPLICATION Left [id number]   Procedure: YAG LASER APPLICATION;  Surgeon: Elta Guadeloupe T. Gershon Crane, MD;  Location: [facility];  Service: Ophthalmology;  Laterality: Left;  left    Current Medications: Outpatient  Medications Prior to Visit  Medication Sig Dispense Refill  . acetaminophen (TYLENOL) 325 MG tablet Take 650 mg by mouth every 6 (six) hours as needed.    Marland Kitchen alendronate (FOSAMAX) 70 MG tablet Take 1 tablet (70 mg total) by mouth once a week. Take with a full glass of water on an empty stomach. 4 tablet 0  . apixaban (ELIQUIS) 5 MG TABS tablet Take 1 tablet (5 mg total) by mouth 2 (two) times daily. 60 tablet 5  . calcium-vitamin D  (OS-CAL 500 + D) 500-200 MG-UNIT tablet Take 1 tablet by mouth 2 (two) times daily.    Marland Kitchen diltiazem (CARDIZEM CD) 360 MG 24 hr capsule Take 1 capsule (360 mg total) by mouth daily. 30 capsule 5  . magnesium oxide (MAG-OX) 400 MG tablet Take 1 tablet (400 mg total) by mouth 3 (three) times daily. 90 tablet 0  . memantine (NAMENDA) 5 MG tablet Take 1 tablet (5 mg total) by mouth 2 (two) times daily. 60 tablet 0  . metoprolol tartrate (LOPRESSOR) 25 MG tablet Take 1 tablet (25 mg total) by mouth 2 (two) times daily. 60 tablet 5  . NON FORMULARY Diet Type: NAS    . Ostomy Supplies (SKIN PREP WIPES) MISC Apply to bilateral heels every shift for prevention    . pantoprazole (PROTONIX) 40 MG tablet Take 1 tablet (40 mg total) by mouth daily. 30 tablet 3  . Polyethyl Glycol-Propyl Glycol (SYSTANE OP) Place 1 drop into both eyes daily as needed. Dry Eyes     . QUEtiapine (SEROQUEL) 25 MG tablet Take 1 tablet (25 mg total) by mouth at bedtime. 30 tablet 0  . sertraline (ZOLOFT) 50 MG tablet Take 1 tablet (50 mg total) by mouth every morning. 30 tablet 0  . torsemide (DEMADEX) 20 MG tablet Take 1/2   tablet by mouth daily.  If your weight goes up as much as 3 pounds take 2 daily 90 tablet 3   No facility-administered medications prior to visit.     Allergies:   Penicillins, Biaxin [clarithromycin], Ciprofloxacin, Emetrol, Feldene [piroxicam], and Sulfa antibiotics   Social History   Socioeconomic History  . Marital status: Widowed    Spouse name: Not on file  . Number of children: Not on file  . Years of education: Not on file  . Highest education level: Not on file  Occupational History  . Not on file  Tobacco Use  . Smoking status: Never Smoker  . Smokeless tobacco: Never Used  Substance and Sexual Activity  . Alcohol use: No  . Drug use: No  . Sexual activity: Yes    Birth control/protection: Surgical  Other Topics Concern  . Not on file  Social History Narrative   Pt lives in an  apartment complex, reports that she still drives   Social Determinants of Health   Financial Resource Strain: Low Risk   . Difficulty of Paying Living Expenses: Not hard at all  Food Insecurity: No Food Insecurity  . Worried About Charity fundraiser in the Last Year: Never true  . Ran Out of Food in the Last Year: Never true  Transportation Needs: No Transportation Needs  . Lack of Transportation (Medical): No  . Lack of Transportation (Non-Medical): No  Physical Activity: Sufficiently Active  . Days of Exercise per Week: 5 days  . Minutes of Exercise per Session: 30 min  Stress: No Stress Concern Present  . Feeling of Stress : Not at all  Social Connections:  Slightly Isolated  . Frequency of Communication with Friends and Family: More than three times a week  . Frequency of Social Gatherings with Friends and Family: More than three times a week  . Attends Religious Services: More than 4 times per year  . Active Member of Clubs or Organizations: Yes  . Attends Archivist Meetings: More than 4 times per year  . Marital Status: Widowed     Family History:  The patient's family history includes Hypertension in her child.   Review of Systems:   Please see the history of present illness.     General:  No chills, fever, night sweats or weight changes.  Cardiovascular:  No chest pain, edema, orthopnea, palpitations, paroxysmal nocturnal dyspnea. Positive for dyspnea on exertion.  Dermatological: No rash, lesions/masses Respiratory: No cough, dyspnea Urologic: No hematuria, dysuria Abdominal:   No nausea, vomiting, diarrhea, bright red blood per rectum, melena, or hematemesis Neurologic:  No visual changes, wkns, changes in mental status. All other systems reviewed and are otherwise negative except as noted above.   Physical Exam:    VS:  BP 110/64   Pulse (!) 58   Temp 98.6 F (37 C)   Ht 5' 4.5" (1.638 m)   Wt 136 lb 6.4 oz (61.9 kg)   SpO2 98%   BMI 23.05 kg/m     General: Well developed, well nourished,female appearing in no acute distress. Head: Normocephalic, atraumatic, sclera non-icteric, no xanthomas, nares are without discharge.  Neck: No carotid bruits. JVD not elevated.  Lungs: Respirations regular and unlabored, without wheezes or rales.  Heart: Irregularly irregular. No S3 or S4.  No murmur, no rubs, or gallops appreciated. Abdomen: Soft, non-tender, non-distended with normoactive bowel sounds. No hepatomegaly. No rebound/guarding. No obvious abdominal masses. Msk:  Strength and tone appear normal for age. No joint deformities or effusions. Extremities: No clubbing or cyanosis. No lower extremity edema.  Distal pedal pulses are 2+ bilaterally. Neuro: Alert and oriented X 3. Moves all extremities spontaneously. No focal deficits noted. Psych:  Responds to questions appropriately with a normal affect. Skin: No rashes or lesions noted  Wt Readings from Last 3 Encounters:  07/24/19 136 lb 6.4 oz (61.9 kg)  06/12/19 135 lb 6.4 oz (61.4 kg)  05/22/19 135 lb 6.4 oz (61.4 kg)     Studies/Labs Reviewed:   EKG:  EKG is not ordered today.   Recent Labs: 04/04/2019: TSH 2.532 04/12/2019: Magnesium 2.1 04/14/2019: ALT 69 05/02/2019: Hemoglobin 11.1; Platelets 231 05/09/2019: B Natriuretic Peptide 445.0 06/09/2019: BUN 20; Creatinine, Ser 1.02; Potassium 4.6; Sodium 142   Lipid Panel    Component Value Date/Time   CHOL 186 03/08/2018 0000   TRIG 83 03/08/2018 0000   HDL 63 03/08/2018 0000   LDLCALC 106 03/08/2018 0000    Additional studies/ records that were reviewed today include:   Echocardiogram: 03/2019 IMPRESSIONS    1. Left ventricular ejection fraction, by visual estimation, is 70 to 75%. The left ventricle has hyperdynamic function. Decreased left ventricular size. There is moderately increased left ventricular hypertrophy.  2. Left ventricular diastolic Doppler parameters are indeterminate pattern of LV diastolic  filling.  3. Right ventricular volume and pressure overload.  4. Global right ventricle has normal systolic function.The right ventricular size is moderately enlarged. Mildly increased right ventricular wall thickness.  5. Left atrial size was severely dilated.  6. Right atrial size was normal.  7. Presence of pericardial fat pad.  8. Moderate aortic valve annular calcification.  9. Moderate to severe mitral annular calcification. 10. Severe calcification of the mitral valve leaflet(s). Valve appears stenotic, not completely evaluated. Suggest follow-up images with VTI measurements or planimetry if possible. 11. The mitral valve is degenerative. Mild mitral valve regurgitation. 12. The tricuspid valve is grossly normal. Tricuspid valve regurgitation is mild. 13. Aortic valve area, by VTI measures 1.83 cm. 14. Aortic valve mean gradient measures 8.8 mmHg. 15. The aortic valve is tricuspid Aortic valve regurgitation was not visualized by color flow Doppler. Mild aortic valve stenosis. 16. Moderately elevated pulmonary artery systolic pressure. 17. The tricuspid regurgitant velocity is 3.67 m/s, and with an assumed right atrial pressure of 8 mmHg, the estimated right ventricular systolic pressure is moderately elevated at 61.9 mmHg. 18. The inferior vena cava is normal in size with <50% respiratory variability, suggesting right atrial pressure of 8 mmHg. 19. The pulmonic valve was grossly normal. Pulmonic valve regurgitation is trivial by color flow Doppler.  Assessment:    1. Permanent atrial fibrillation ([place])   2. Chronic diastolic congestive heart failure ([place])   3. Aortic valve stenosis, etiology of cardiac valve disease unspecified      Plan:   In order of problems listed above:  1. Permanent Atrial Fibrillation - she denies any recent palpitations and HR has been in the 50's to 70's when checked at home. She denies any dizziness or presyncope. Continue current regimen with  Cardizem CD 360mg  daily and Lopressor 25mg  BID.  - she denies any evidence of active bleeding. Remains on Eliquis 5mg  BID for anticoagulation.   2. Chronic Diastolic CHF - she has chronic dyspnea on exertion but denies any orthopnea, PND or edema. Weight has been stable on her home scales. Continue Torsemide 10mg  daily with instructions to take an extra tablet if needed for weight gain greater than 2 lbs overnight, 5 lbs in one week or worsening edema.  - her dyspnea on exertion has been stable and she does mention bilateral shoulder pain as outlined above which seems more MSK but cannot rule out a cardiac etiology. We reviewed possible ischemic testing but she wishes to hold off on this for now unless symptoms progress which seems reasonable. I informed her to notify our office of any change in her symptoms.   3. Aortic Stenosis - mild by echo in 03/2019. Continue to follow.    Medication Adjustments/Labs and Tests Ordered: Current medicines are reviewed at length with the patient today.  Concerns regarding medicines are outlined above.  Medication changes, Labs and Tests ordered today are listed in the Patient Instructions below. Patient Instructions  Medication Instructions:  Your physician recommends that you continue on your current medications as directed. Please refer to the Current Medication list given to you today.  *If you need a refill on your cardiac medications before your next appointment, please call your pharmacy*  Lab Work: NONE   If you have labs (blood work) drawn today and your tests are completely normal, you will receive your results only by: Marland Kitchen MyChart Message (if you have MyChart) OR . A paper copy in the mail If you have any lab test that is abnormal or we need to change your treatment, we will call you to review the results.  Testing/Procedures: NONE   Follow-Up: At [facility], you and your health needs are our priority.  As part of our continuing mission  to provide you with exceptional heart care, we have created designated Provider Care Teams.  These Care Teams include your  primary Cardiologist (physician) and Advanced Practice Providers (APPs -  Physician Assistants and Nurse Practitioners) who all work together to provide you with the care you need, when you need it.  Your next appointment:   6 month(s)  The format for your next appointment:   In Person  Provider:   Rozann Lesches, MD  Other Instructions Thank you for choosing [facility]!       Signed, Kristy DEBRUYNE, PA-C  07/25/2019 9:26 AM    [facility] [address] [place], [place] [address] Phone: [phone] Fax: [phone]

## 2019-07-24 NOTE — Patient Instructions (Signed)
Medication Instructions:  Your physician recommends that you continue on your current medications as directed. Please refer to the Current Medication list given to you today.  *If you need a refill on your cardiac medications before your next appointment, please call your pharmacy*  Lab Work: NONE  If you have labs (blood work) drawn today and your tests are completely normal, you will receive your results only by: . MyChart Message (if you have MyChart) OR . A paper copy in the mail If you have any lab test that is abnormal or we need to change your treatment, we will call you to review the results.  Testing/Procedures: NONE   Follow-Up: At [facility], you and your health needs are our priority.  As part of our continuing mission to provide you with exceptional heart care, we have created designated Provider Care Teams.  These Care Teams include your primary Cardiologist (physician) and Advanced Practice Providers (APPs -  Physician Assistants and Nurse Practitioners) who all work together to provide you with the care you need, when you need it.  Your next appointment:   6 month(s)  The format for your next appointment:   In Person  Provider:   Samuel McDowell, MD  Other Instructions Thank you for choosing [facility]!    

## 2019-07-25 ENCOUNTER — Encounter: Payer: Self-pay | Admitting: Student

## 2019-08-01 ENCOUNTER — Other Ambulatory Visit: Payer: PPO

## 2019-08-01 ENCOUNTER — Other Ambulatory Visit: Payer: Self-pay

## 2019-08-01 DIAGNOSIS — Z515 Encounter for palliative care: Secondary | ICD-10-CM

## 2019-08-01 NOTE — Progress Notes (Addendum)
COMMUNITY PALLIATIVE CARE SW NOTE  PATIENT NAME: Kristy Mckinney DOB: 1931/08/28 MRN: [id number]  PRIMARY CARE PROVIDER: Patient, No Pcp Per  RESPONSIBLE PARTY:  Acct ID - Guarantor Home Phone Work Phone Relationship Acct Type  [id number] Webb [id number] [phone]  Self P/F     [address], [place], [place] [address]     PLAN OF CARE and INTERVENTIONS:             1. GOALS OF CARE/ ADVANCE CARE PLANNING:  Patient is a FULL CODE. Patient has a MOST form complete. HCPOA is daughter, Kristy Mckinney. Goal is to continue to maintain her independence. 2. SOCIAL/EMOTIONAL/SPIRITUAL ASSESSMENT/ INTERVENTIONS:  SW met with patient in the home. Patient denies pain at this time, but reports pain in her back when she walks. Patient is taking tylenol for pain. Patient said she is doing "pretty good". Patient's appetite is good, and she likes to eat small meals and is focused on limiting her salt intake. Patient said she is sleeping well and taking 50m of melatonin each night. Patient said she enjoys watching television. Patient said she has been working on puzzles and showed SW. Patient shared all about her love for music and playing the piano. SW provided emotional support, discussed goals and used active and reflective listening.  3. PATIENT/CAREGIVER EDUCATION/ COPING:  Patient is alert, engaged in conversation. Patient was calm, pleasant. Patient has strong family and friend support. Patient said she has a companion that she spends time with her and takes her out to dinner. 4. PERSONAL EMERGENCY PLAN:  Family will call 9-1-1 for emergencies. Patient wears an alert necklace that she can push for staff at the apartments for emergencies. 5. COMMUNITY RESOURCES COORDINATION/ HEALTH CARE NAVIGATION:  Patient manages her care. Patient saw BMauritaniaPA-C with [facility] 1/14 and received a good report. Patient's PCP retired last month so patient is in the process of switching PCPs.   6. FINANCIAL/LEGAL CONCERNS/INTERVENTIONS:  None.     SOCIAL HX:  Social History   Tobacco Use  . Smoking status: Never Smoker  . Smokeless tobacco: Never Used  Substance Use Topics  . Alcohol use: No    CODE STATUS:   Code Status: Prior (FULL CODE) ADVANCED DIRECTIVES: Y MOST FORM COMPLETE:  Yes. HOSPICE EDUCATION PROVIDED: None.  PPS: Patient is independent of her ADLs.  I spent461mutes with patient/family, [id number], support and consultation.  WhMargaretmary LombardLCSW

## 2019-08-14 ENCOUNTER — Telehealth: Payer: Self-pay

## 2019-08-14 NOTE — Telephone Encounter (Signed)
Telephone call to patient to schedule palliative care visit.  Patient did not answer phone.  RN left message requesting call back to schedule palliative care visit.

## 2019-08-18 ENCOUNTER — Telehealth: Payer: Self-pay

## 2019-08-18 NOTE — Telephone Encounter (Signed)
Telephone call to patient x 2 today 10:38 AM and 5:28 PM to schedule palliative care visit.  RN left message requesting call back at AM call.  Patient didn't answer phone when RN called in the PM.  Patient has not returned call.

## 2019-08-20 ENCOUNTER — Telehealth: Payer: Self-pay

## 2019-08-20 ENCOUNTER — Other Ambulatory Visit: Payer: Self-pay

## 2019-08-20 ENCOUNTER — Encounter: Payer: Self-pay | Admitting: Family Medicine

## 2019-08-20 ENCOUNTER — Ambulatory Visit (INDEPENDENT_AMBULATORY_CARE_PROVIDER_SITE_OTHER): Payer: PPO | Admitting: Family Medicine

## 2019-08-20 VITALS — BP 120/58 | HR 85 | Temp 97.5°F | Ht 64.5 in | Wt 134.6 lb

## 2019-08-20 DIAGNOSIS — M25552 Pain in left hip: Secondary | ICD-10-CM | POA: Diagnosis not present

## 2019-08-20 DIAGNOSIS — F419 Anxiety disorder, unspecified: Secondary | ICD-10-CM

## 2019-08-20 DIAGNOSIS — E559 Vitamin D deficiency, unspecified: Secondary | ICD-10-CM

## 2019-08-20 DIAGNOSIS — M810 Age-related osteoporosis without current pathological fracture: Secondary | ICD-10-CM | POA: Diagnosis not present

## 2019-08-20 DIAGNOSIS — M5136 Other intervertebral disc degeneration, lumbar region: Secondary | ICD-10-CM | POA: Diagnosis not present

## 2019-08-20 DIAGNOSIS — E875 Hyperkalemia: Secondary | ICD-10-CM

## 2019-08-20 DIAGNOSIS — I4891 Unspecified atrial fibrillation: Secondary | ICD-10-CM | POA: Diagnosis not present

## 2019-08-20 DIAGNOSIS — R7309 Other abnormal glucose: Secondary | ICD-10-CM | POA: Diagnosis not present

## 2019-08-20 DIAGNOSIS — K219 Gastro-esophageal reflux disease without esophagitis: Secondary | ICD-10-CM

## 2019-08-20 DIAGNOSIS — I10 Essential (primary) hypertension: Secondary | ICD-10-CM | POA: Diagnosis not present

## 2019-08-20 NOTE — Telephone Encounter (Signed)
SW attempted to contact patient to schedule palliative care visit. No answer.

## 2019-08-20 NOTE — Patient Instructions (Addendum)
Consider stopping namenda by taking once a day then none Consider stopping seroquel  Consider stopping the zoloft -breaking in half for 2 weeks then stop  Calcium 600mg  twice a day Vit D 2000IU daily  Back -xrays Left hip and pelvis xrays

## 2019-08-20 NOTE — Progress Notes (Signed)
New Patient Office Visit  Subjective:  Patient ID: Kristy Mckinney, female    DOB: Dec 27, 1931  Age: 86 y.o. MRN: [id number]  CC:  Chief Complaint  Patient presents with  . Establish Care  . Hip Pain    left side/right side has had surgery    HPI [name] presents for hip pain, back pain Right hip fracture 5/17-rehab Afib/CHF Ptosis-cataracts Spinal stenosis  Past Medical History:  Diagnosis Date  . Abdominal distension (gaseous)   . Acute on chronic diastolic heart failure ([place])   . Age-related osteoporosis without current pathological fracture   . Anemia, unspecified   . Anxiety disorder, unspecified   . Atrial fibrillation ([place])   . Bronchitis, not specified as acute or chronic   . Chronic back pain   . Diarrhea   . Disorder of the skin and subcutaneous tissue, unspecified   . Dysphagia 05/03/2016  . Essential (primary) hypertension   . Gastro-esophageal reflux disease with esophagitis   . GERD (gastroesophageal reflux disease)   . Iron deficiency anemia, unspecified   . Irritable bowel syndrome with diarrhea   . Left temporomandibular joint disorder, unspecified   . Lumbago   . Pneumonia   . Shortness of breath   . Unspecified dementia without behavioral disturbance ([place])   . Unspecified fracture of unspecified wrist and hand, initial encounter for closed fracture   . Vitamin D deficiency, unspecified     Past Surgical History:  Procedure Laterality Date  . ABDOMINAL HYSTERECTOMY    . BACK SURGERY    . CHOLECYSTECTOMY N/A 01/29/2014   Procedure: LAPAROSCOPIC CHOLECYSTECTOMY;  Surgeon: Scherry Ran, MD;  Location: [facility];  Service: General;  Laterality: N/A;  . COLONOSCOPY N/A 10/27/2015   Procedure: COLONOSCOPY;  Surgeon: Rogene Houston, MD;  Location: [facility];  Service: Endoscopy;  Laterality: N/A;  215  . INTRAMEDULLARY (IM) NAIL INTERTROCHANTERIC Right 11/29/2018   Procedure: INTRAMEDULLARY (IM) NAIL INTERTROCHANTRIC FRACTURE;   Surgeon: Renette Butters, MD;  Location: [facility];  Service: Orthopedics;  Laterality: Right;  . TONSILLECTOMY    . YAG LASER APPLICATION Left [id number]   Procedure: YAG LASER APPLICATION;  Surgeon: Elta Guadeloupe T. Gershon Crane, MD;  Location: [facility];  Service: Ophthalmology;  Laterality: Left;  left    Family History  Problem Relation Age of Onset  . Hypertension Child     Social History   Socioeconomic History  . Marital status: Widowed    Spouse name: Not on file  . Number of children: Not on file  . Years of education: Not on file  . Highest education level: Not on file  Occupational History  . Occupation: retired  Tobacco Use  . Smoking status: Never Smoker  . Smokeless tobacco: Never Used  Substance and Sexual Activity  . Alcohol use: No  . Drug use: No  . Sexual activity: Yes    Birth control/protection: Surgical  Other Topics Concern  . Not on file  Social History Narrative   Pt lives in an apartment complex, reports that she still drives   Social Determinants of Health   Financial Resource Strain: Low Risk   . Difficulty of Paying Living Expenses: Not hard at all  Food Insecurity: No Food Insecurity  . Worried About Charity fundraiser in the Last Year: Never true  . Ran Out of Food in the Last Year: Never true  Transportation Needs: No Transportation Needs  . Lack of Transportation (Medical): No  . Lack of  Transportation (Non-Medical): No  Physical Activity: Sufficiently Active  . Days of Exercise per Week: 5 days  . Minutes of Exercise per Session: 30 min  Stress: No Stress Concern Present  . Feeling of Stress : Not at all  Social Connections: Slightly Isolated  . Frequency of Communication with Friends and Family: More than three times a week  . Frequency of Social Gatherings with Friends and Family: More than three times a week  . Attends Religious Services: More than 4 times per year  . Active Member of Clubs or Organizations: Yes  . Attends Archivist  Meetings: More than 4 times per year  . Marital Status: Widowed  Intimate Partner Violence: Not At Risk  . Fear of Current or Ex-Partner: No  . Emotionally Abused: No  . Physically Abused: No  . Sexually Abused: No    ROS Review of Systems  Constitutional: Positive for appetite change.  HENT: Positive for hearing loss and rhinorrhea.   Respiratory: Positive for cough and shortness of breath.   Endocrine: Positive for polyuria.  Musculoskeletal: Positive for arthralgias, back pain, myalgias and neck pain.       Osteoporosis  Neurological: Positive for weakness.  Psychiatric/Behavioral: Positive for sleep disturbance.       Dementia    Objective:   Today's Vitals: BP (!) 120/58 (BP Location: Right Arm, Patient Position: Sitting, Cuff Size: Normal)   Pulse 85   Temp (!) 97.5 F (36.4 C) (Oral)   Ht 5' 4.5" (1.638 m)   Wt 134 lb 9.6 oz (61.1 kg)   SpO2 97%   BMI 22.75 kg/m   Physical Exam Constitutional:      Appearance: Normal appearance.  Cardiovascular:     Rate and Rhythm: Normal rate and regular rhythm.     Pulses: Normal pulses.     Heart sounds: Normal heart sounds.  Musculoskeletal:     Cervical back: Normal range of motion and neck supple.  Neurological:     Mental Status: She is alert.  Psychiatric:        Mood and Affect: Mood normal.        Behavior: Behavior normal.     Assessment & Plan:  1. Atrial fibrillation with RVR (HCC) Cardio following-10/20 admission-weight checks by pt for CHF monitoring 2. Essential (primary) hypertension Stable - TSH - COMPLETE METABOLIC PANEL WITH GFR - Lipid panel  3. GERD without esophagitis - CBC w/Diff/Platelet  4. Hypomagnesemia - Magnesium No recent levels 5. Vitamin D deficiency, unspecified 2000IU daily - VITAMIN D 25 Hydroxy (Vit-D Deficiency, Fractures) Calcium BID  6. Hyperkalemia - COMPLETE METABOLIC PANEL WITH GFR  7. Osteoporosis, unspecified osteoporosis type, unspecified pathological  fracture presence - VITAMIN D 25 Hydroxy (Vit-D Deficiency, Fractures)  8. Elevated glucose - Hemoglobin A1c  9. Degenerative lumbar disc - DG Lumbar Spine Complete; Future  10. Left hip pain - DG Lumbar Spine Complete; Future - DG HIP UNILAT W OR W/O PELVIS 2-3 VIEWS LEFT; Future  Pt became anxious during admission and Seroquel and zoloft started-pt and daughter want to consider discontinuing medication-pt stablized. Suggested stepwise wean of medication-seroquel/zoloft and namenda-MMS 30 Outpatient Encounter Medications as of 08/20/2019  Medication Sig  . acetaminophen (TYLENOL) 325 MG tablet Take 650 mg by mouth every 6 (six) hours as needed.  Marland Kitchen alendronate (FOSAMAX) 70 MG tablet Take 1 tablet (70 mg total) by mouth once a week. Take with a full glass of water on an empty stomach.  Marland Kitchen apixaban (ELIQUIS) 5  MG TABS tablet Take 1 tablet (5 mg total) by mouth 2 (two) times daily.  . calcium-vitamin D (OS-CAL 500 + D) 500-200 MG-UNIT tablet Take 1 tablet by mouth 2 (two) times daily.  Marland Kitchen diltiazem (CARDIZEM CD) 360 MG 24 hr capsule Take 1 capsule (360 mg total) by mouth daily.  . magnesium oxide (MAG-OX) 400 MG tablet Take 1 tablet (400 mg total) by mouth 3 (three) times daily.  . memantine (NAMENDA) 5 MG tablet Take 1 tablet (5 mg total) by mouth 2 (two) times daily.  . metoprolol tartrate (LOPRESSOR) 25 MG tablet Take 1 tablet (25 mg total) by mouth 2 (two) times daily.  . NON FORMULARY Diet Type: NAS  . Ostomy Supplies (SKIN PREP WIPES) MISC Apply to bilateral heels every shift for prevention  . pantoprazole (PROTONIX) 40 MG tablet Take 1 tablet (40 mg total) by mouth daily.  Vladimir Faster Glycol-Propyl Glycol (SYSTANE OP) Place 1 drop into both eyes daily as needed. Dry Eyes   . QUEtiapine (SEROQUEL) 25 MG tablet Take 1 tablet (25 mg total) by mouth at bedtime.  . sertraline (ZOLOFT) 50 MG tablet Take 1 tablet (50 mg total) by mouth every morning.  . torsemide (DEMADEX) 20 MG tablet Take  1/2   tablet by mouth daily.  If your weight goes up as much as 3 pounds take 2 daily   No facility-administered encounter medications on file as of 08/20/2019.   1 hour spent with pt and daughter discussing medical problems, providers other than primary caring for patient, notes reviewed from old records, admission reviewed, MMS exam completed, exam, assessment and plan Follow-up:2 months-after meds weaned  Jeanetta Alonzo Hannah Beat, MD

## 2019-08-21 ENCOUNTER — Telehealth: Payer: Self-pay

## 2019-08-21 ENCOUNTER — Ambulatory Visit (HOSPITAL_COMMUNITY)
Admission: RE | Admit: 2019-08-21 | Discharge: 2019-08-21 | Disposition: A | Discharge status: Home / Self Care | Payer: PPO | Source: Ambulatory Visit | Attending: Family Medicine | Admitting: Family Medicine

## 2019-08-21 DIAGNOSIS — M5136 Other intervertebral disc degeneration, lumbar region: Secondary | ICD-10-CM | POA: Insufficient documentation

## 2019-08-21 DIAGNOSIS — S79912A Unspecified injury of left hip, initial encounter: Secondary | ICD-10-CM | POA: Diagnosis not present

## 2019-08-21 DIAGNOSIS — M545 Low back pain: Secondary | ICD-10-CM | POA: Diagnosis not present

## 2019-08-21 DIAGNOSIS — K219 Gastro-esophageal reflux disease without esophagitis: Secondary | ICD-10-CM | POA: Diagnosis not present

## 2019-08-21 DIAGNOSIS — E559 Vitamin D deficiency, unspecified: Secondary | ICD-10-CM | POA: Diagnosis not present

## 2019-08-21 DIAGNOSIS — I10 Essential (primary) hypertension: Secondary | ICD-10-CM | POA: Diagnosis not present

## 2019-08-21 DIAGNOSIS — M25552 Pain in left hip: Secondary | ICD-10-CM | POA: Insufficient documentation

## 2019-08-21 DIAGNOSIS — M810 Age-related osteoporosis without current pathological fracture: Secondary | ICD-10-CM | POA: Diagnosis not present

## 2019-08-21 DIAGNOSIS — R7309 Other abnormal glucose: Secondary | ICD-10-CM | POA: Diagnosis not present

## 2019-08-21 DIAGNOSIS — E875 Hyperkalemia: Secondary | ICD-10-CM | POA: Diagnosis not present

## 2019-08-21 NOTE — Telephone Encounter (Signed)
Telephone call to schedule palliative care visit.  Patient reports she is driving to hospital to have labs and X-Rays done this AM.  Patient reports she will call RN back later today or in the AM to schedule visit.

## 2019-08-22 LAB — HEMOGLOBIN A1C
Hgb A1c MFr Bld: 5.6 % of total Hgb (ref <5.7)
Mean Plasma Glucose: 114 (calc)
eAG (mmol/L): 6.3 (calc)

## 2019-08-22 LAB — CBC WITH DIFFERENTIAL/PLATELET
Absolute Monocytes: 542 cells/uL (ref 200–950)
Basophils Absolute: 32 cells/uL (ref 0–200)
Basophils Relative: 0.5 %
Eosinophils Absolute: 82 cells/uL (ref 15–500)
Eosinophils Relative: 1.3 %
HCT: 33.7 % — ABNORMAL LOW (ref 35.0–45.0)
Hemoglobin: 10.5 g/dL — ABNORMAL LOW (ref 11.7–15.5)
Lymphs Abs: 857 cells/uL (ref 850–3900)
MCH: 25.2 pg — ABNORMAL LOW (ref 27.0–33.0)
MCHC: 31.2 g/dL — ABNORMAL LOW (ref 32.0–36.0)
MCV: 81 fL (ref 80.0–100.0)
MPV: 9.5 fL (ref 7.5–12.5)
Monocytes Relative: 8.6 %
Neutro Abs: 4788 cells/uL (ref 1500–7800)
Neutrophils Relative %: 76 %
Platelets: 259 10*3/uL (ref 140–400)
RBC: 4.16 10*6/uL (ref 3.80–5.10)
RDW: 15.1 % — ABNORMAL HIGH (ref 11.0–15.0)
Total Lymphocyte: 13.6 %
WBC: 6.3 10*3/uL (ref 3.8–10.8)

## 2019-08-22 LAB — COMPLETE METABOLIC PANEL WITH GFR
AG Ratio: 1.5 (calc) (ref 1.0–2.5)
ALT: 14 U/L (ref 6–29)
AST: 18 U/L (ref 10–35)
Albumin: 4 g/dL (ref 3.6–5.1)
Alkaline phosphatase (APISO): 71 U/L (ref 37–153)
BUN/Creatinine Ratio: 21 (calc) (ref 6–22)
BUN: 23 mg/dL (ref 7–25)
CO2: 31 mmol/L (ref 20–32)
Calcium: 9.7 mg/dL (ref 8.6–10.4)
Chloride: 102 mmol/L (ref 98–110)
Creat: 1.09 mg/dL — ABNORMAL HIGH (ref 0.60–0.88)
GFR, Est African American: 54 mL/min/{1.73_m2} — ABNORMAL LOW (ref >=60)
GFR, Est Non African American: 46 mL/min/{1.73_m2} — ABNORMAL LOW (ref >=60)
Globulin: 2.6 g/dL (calc) (ref 1.9–3.7)
Glucose, Bld: 89 mg/dL (ref 65–99)
Potassium: 4.4 mmol/L (ref 3.5–5.3)
Sodium: 140 mmol/L (ref 135–146)
Total Bilirubin: 0.7 mg/dL (ref 0.2–1.2)
Total Protein: 6.6 g/dL (ref 6.1–8.1)

## 2019-08-22 LAB — VITAMIN D 25 HYDROXY (VIT D DEFICIENCY, FRACTURES): Vit D, 25-Hydroxy: 26 ng/mL — ABNORMAL LOW (ref 30–100)

## 2019-08-22 LAB — TSH: TSH: 2.28 mIU/L (ref 0.40–4.50)

## 2019-08-22 LAB — MAGNESIUM: Magnesium: 2.2 mg/dL (ref 1.5–2.5)

## 2019-08-25 ENCOUNTER — Telehealth: Payer: Self-pay

## 2019-08-25 NOTE — Telephone Encounter (Signed)
Telephone call to patient to schedule palliative care visit.  Patient in agreement with RN making home visit 09/02/19 at 10:00 AM.

## 2019-08-27 NOTE — Progress Notes (Signed)
Spoke with patient, advised of instructions, pt. Will talk with daughter and discuss with Dr. Holly Bodily at next visit.

## 2019-09-02 ENCOUNTER — Other Ambulatory Visit: Payer: PPO

## 2019-09-02 ENCOUNTER — Other Ambulatory Visit: Payer: Self-pay

## 2019-09-02 DIAGNOSIS — Z515 Encounter for palliative care: Secondary | ICD-10-CM

## 2019-09-02 NOTE — Progress Notes (Signed)
PATIENT NAME: Kristy Mckinney DOB: December 14, 1931 MRN: [id number]  PRIMARY CARE PROVIDER: [name], MD  RESPONSIBLE PARTY:  Acct ID - Guarantor Home Phone Work Phone Relationship Acct Type  [id number] Webb [phone]  Self P/F     [address], [place], [place] [address]    PLAN OF CARE and INTERVENTIONS:               1.  GOALS OF CARE/ ADVANCE CARE PLANNING:  Remain at home and remain independent.               2.  PATIENT/CAREGIVER EDUCATION:   Education on fall precautions, education on s/s of infection, reviewed meds, support               3.  DISEASE STATUS:SW and RN made joint palliative care home visit. Patient alert and oriented and reports she has been doing well.  Patient denies having pain but reports she has some discomfort in her back she feels is arthritis.   Patient reports she has a cough and runny nose she feels this is allergy related. Patient report she does have shortness of breath on exertion. Patient had recent MD appointment with Dr [name] as Dr. Luan Pulling retired who was her primary care provider. Patient reports MD wants her to taper down on her Namenda and Zoloft then stop medication. Patient states MD wanted her to discontinue Seroquel however patient states she needs this medication as it helps her sleep at night.   Patient also takes melatonin at bedtime for sleep. Patient reports she sleeps well but does not sleep soundly. Patient reports her appetite is good and denies having any nausea or vomiting. Patient reports she is scheduled to receive her Covid-19 vaccine on Thursday. Patients breath sounds are clear. Patient  remains independent with ambulation and is able to do her own ADL's.  Patient continues to drive. Patient vital signs are stable. Nurse reviewed patient's medications with patient. Patient has MOST form and wants limited CPR interventions. Patient remains in agreement with palliative care services. Patient encouraged to contact palliative care  with questions or concerns.      HISTORY OF PRESENT ILLNESS:  Patient is a 86 year old patient who is followed by palliative care and is seen monthly and PRN.  CODE STATUS: Full  Code  ADVANCED DIRECTIVES:Yes MOST FORM: Yes PPS: 60%   PHYSICAL EXAM:   VITALS: Today's Vitals   09/02/19 1011  BP: 118/64  Pulse: 72  Resp: 16  Temp: (!) 96 F (35.6 C)  TempSrc: Temporal  SpO2: 95%  Weight: 134 lb (60.8 kg)  PainSc: 0-No pain    LUNGS: clear to auscultation  CARDIAC: Cor RRR  EXTREMITIES: 1+ edema SKIN: Skin color, texture, turgor normal. No rashes or lesions  NEURO: Independent with ambulation and able to do ADL's       Nilda Simmer, RN

## 2019-09-02 NOTE — Progress Notes (Addendum)
COMMUNITY PALLIATIVE CARE SW NOTE  PATIENT NAME: Kristy Mckinney DOB: Apr 27, 1932 MRN: [id number]  PRIMARY CARE PROVIDER: [name], MD  RESPONSIBLE PARTY:  Acct ID - Guarantor Home Phone Work Phone Relationship Acct Type  [id number] Webb [phone]  Self P/F     [address], [place], [place] [address]     PLAN OF CARE and INTERVENTIONS:             1. GOALS OF CARE/ ADVANCE CARE PLANNING:  Patient is a FULL CODE.Patient has a MOST form completed.HCPOA is daughter, [name]. Goal is to continue to avoid hospitalizations. 2. SOCIAL/EMOTIONAL/SPIRITUAL ASSESSMENT/ INTERVENTIONS:  SW and RN met with patient in the home. Patient denies pain, noted ongoing pain in her back due to arthritis. Patient takes tylenol as needed. Patient said she has a cough with a runny nose. RN discussed. No falls reported. Patient is sleeping fair, continues to take melatonin. Patient said her appetite is good, checks weight daily. Patient likes to watch television and do puzzles. SW assessed mood, provided emotional support, and used active and reflective listening.  3. PATIENT/CAREGIVER EDUCATION/ COPING:  Patient was alert, engaged with team. Patient was pleasant. Patient said she enjoyed the conversation. Patient said she has felt "down" the past few weeks due to some family stress but that things have improved. Patient continues to see her long-time friend. Patient's family and friends are supportive.  4. PERSONAL EMERGENCY PLAN:  Family will call 9-1-1 for emergencies. Patient wears an alert necklace that she can push for staff at the apartments for emergencies. 5. COMMUNITY RESOURCES COORDINATION/ HEALTH CARE NAVIGATION:  Patient manages her own care. Patient has follow-up with PCP on 4/12. Palliative care team scheduled next visit for 3/25 at 10:00AM. 6. FINANCIAL/LEGAL CONCERNS/INTERVENTIONS:  None.     SOCIAL HX:  Social History   Tobacco Use  . Smoking status: Never Smoker  . Smokeless  tobacco: Never Used  Substance Use Topics  . Alcohol use: No    CODE STATUS:   Code Status: Prior (FULL) ADVANCED DIRECTIVES: Y MOST FORM COMPLETE:  Yes. HOSPICE EDUCATION PROVIDED: None.  PPS: Patient is independent of all her ADLs.  I spent21mnutes with patient/family, [id number], support and consultation.   WMargaretmary Lombard LCSW

## 2019-09-10 ENCOUNTER — Encounter (HOSPITAL_COMMUNITY): Payer: Self-pay | Admitting: *Deleted

## 2019-09-10 ENCOUNTER — Emergency Department (HOSPITAL_COMMUNITY): Payer: PPO

## 2019-09-10 ENCOUNTER — Telehealth: Payer: Self-pay | Admitting: Family Medicine

## 2019-09-10 ENCOUNTER — Inpatient Hospital Stay (HOSPITAL_COMMUNITY)
Admission: EM | Admit: 2019-09-10 | Discharge: 2019-09-13 | DRG: 291 | Disposition: A | Discharge status: Home Health | Payer: PPO | Attending: Internal Medicine | Admitting: Internal Medicine

## 2019-09-10 ENCOUNTER — Other Ambulatory Visit: Payer: Self-pay

## 2019-09-10 ENCOUNTER — Observation Stay (HOSPITAL_COMMUNITY): Payer: PPO

## 2019-09-10 DIAGNOSIS — I110 Hypertensive heart disease with heart failure: Secondary | ICD-10-CM | POA: Diagnosis present

## 2019-09-10 DIAGNOSIS — I4821 Permanent atrial fibrillation: Secondary | ICD-10-CM | POA: Diagnosis present

## 2019-09-10 DIAGNOSIS — Z7901 Long term (current) use of anticoagulants: Secondary | ICD-10-CM | POA: Diagnosis not present

## 2019-09-10 DIAGNOSIS — F419 Anxiety disorder, unspecified: Secondary | ICD-10-CM | POA: Diagnosis present

## 2019-09-10 DIAGNOSIS — K5790 Diverticulosis of intestine, part unspecified, without perforation or abscess without bleeding: Secondary | ICD-10-CM | POA: Diagnosis present

## 2019-09-10 DIAGNOSIS — F0391 Unspecified dementia with behavioral disturbance: Secondary | ICD-10-CM | POA: Diagnosis present

## 2019-09-10 DIAGNOSIS — R531 Weakness: Secondary | ICD-10-CM

## 2019-09-10 DIAGNOSIS — I5033 Acute on chronic diastolic (congestive) heart failure: Secondary | ICD-10-CM | POA: Diagnosis present

## 2019-09-10 DIAGNOSIS — M810 Age-related osteoporosis without current pathological fracture: Secondary | ICD-10-CM | POA: Diagnosis present

## 2019-09-10 DIAGNOSIS — G8929 Other chronic pain: Secondary | ICD-10-CM | POA: Diagnosis present

## 2019-09-10 DIAGNOSIS — R0602 Shortness of breath: Secondary | ICD-10-CM | POA: Diagnosis present

## 2019-09-10 DIAGNOSIS — F329 Major depressive disorder, single episode, unspecified: Secondary | ICD-10-CM

## 2019-09-10 DIAGNOSIS — Z9049 Acquired absence of other specified parts of digestive tract: Secondary | ICD-10-CM

## 2019-09-10 DIAGNOSIS — N200 Calculus of kidney: Secondary | ICD-10-CM | POA: Diagnosis present

## 2019-09-10 DIAGNOSIS — Z20822 Contact with and (suspected) exposure to covid-19: Secondary | ICD-10-CM | POA: Diagnosis present

## 2019-09-10 DIAGNOSIS — J918 Pleural effusion in other conditions classified elsewhere: Secondary | ICD-10-CM | POA: Diagnosis present

## 2019-09-10 DIAGNOSIS — J9601 Acute respiratory failure with hypoxia: Secondary | ICD-10-CM

## 2019-09-10 DIAGNOSIS — R4189 Other symptoms and signs involving cognitive functions and awareness: Secondary | ICD-10-CM | POA: Diagnosis present

## 2019-09-10 DIAGNOSIS — I4891 Unspecified atrial fibrillation: Secondary | ICD-10-CM | POA: Diagnosis present

## 2019-09-10 DIAGNOSIS — J948 Other specified pleural conditions: Secondary | ICD-10-CM | POA: Diagnosis not present

## 2019-09-10 DIAGNOSIS — J90 Pleural effusion, not elsewhere classified: Secondary | ICD-10-CM | POA: Diagnosis not present

## 2019-09-10 DIAGNOSIS — F0390 Unspecified dementia without behavioral disturbance: Secondary | ICD-10-CM | POA: Diagnosis present

## 2019-09-10 DIAGNOSIS — Z90710 Acquired absence of both cervix and uterus: Secondary | ICD-10-CM

## 2019-09-10 DIAGNOSIS — I10 Essential (primary) hypertension: Secondary | ICD-10-CM | POA: Diagnosis not present

## 2019-09-10 DIAGNOSIS — Z79899 Other long term (current) drug therapy: Secondary | ICD-10-CM | POA: Diagnosis not present

## 2019-09-10 DIAGNOSIS — K219 Gastro-esophageal reflux disease without esophagitis: Secondary | ICD-10-CM | POA: Diagnosis present

## 2019-09-10 DIAGNOSIS — F32A Depression, unspecified: Secondary | ICD-10-CM | POA: Diagnosis present

## 2019-09-10 LAB — COMPREHENSIVE METABOLIC PANEL
ALT: 25 U/L (ref 0–44)
AST: 26 U/L (ref 15–41)
Albumin: 3.9 g/dL (ref 3.5–5.0)
Alkaline Phosphatase: 74 U/L (ref 38–126)
Anion gap: 10 (ref 5–15)
BUN: 21 mg/dL (ref 8–23)
CO2: 25 mmol/L (ref 22–32)
Calcium: 9.7 mg/dL (ref 8.9–10.3)
Chloride: 105 mmol/L (ref 98–111)
Creatinine, Ser: 0.82 mg/dL (ref 0.44–1.00)
GFR calc Af Amer: >60 mL/min (ref >60)
GFR calc non Af Amer: >60 mL/min (ref >60)
Glucose, Bld: 118 mg/dL — ABNORMAL HIGH (ref 70–99)
Potassium: 4 mmol/L (ref 3.5–5.1)
Sodium: 140 mmol/L (ref 135–145)
Total Bilirubin: 1.5 mg/dL — ABNORMAL HIGH (ref 0.3–1.2)
Total Protein: 7.5 g/dL (ref 6.5–8.1)

## 2019-09-10 LAB — URINALYSIS, ROUTINE W REFLEX MICROSCOPIC
Bacteria, UA: NONE SEEN
Bilirubin Urine: NEGATIVE
Glucose, UA: NEGATIVE mg/dL
Hgb urine dipstick: NEGATIVE
Ketones, ur: NEGATIVE mg/dL
Leukocytes,Ua: NEGATIVE
Nitrite: NEGATIVE
Protein, ur: 100 mg/dL — AB
Specific Gravity, Urine: 1.018 (ref 1.005–1.030)
pH: 6 (ref 5.0–8.0)

## 2019-09-10 LAB — CBC
HCT: 34.6 % — ABNORMAL LOW (ref 36.0–46.0)
Hemoglobin: 10.5 g/dL — ABNORMAL LOW (ref 12.0–15.0)
MCH: 25.6 pg — ABNORMAL LOW (ref 26.0–34.0)
MCHC: 30.3 g/dL (ref 30.0–36.0)
MCV: 84.4 fL (ref 80.0–100.0)
Platelets: 274 10*3/uL (ref 150–400)
RBC: 4.1 MIL/uL (ref 3.87–5.11)
RDW: 16.7 % — ABNORMAL HIGH (ref 11.5–15.5)
WBC: 9.2 10*3/uL (ref 4.0–10.5)
nRBC: 0 % (ref 0.0–0.2)

## 2019-09-10 LAB — PROTEIN, PLEURAL OR PERITONEAL FLUID: Total protein, fluid: <3 g/dL

## 2019-09-10 LAB — BRAIN NATRIURETIC PEPTIDE: B Natriuretic Peptide: 1555 pg/mL — ABNORMAL HIGH (ref 0.0–100.0)

## 2019-09-10 LAB — POC SARS CORONAVIRUS 2 AG -  ED: SARS Coronavirus 2 Ag: NEGATIVE

## 2019-09-10 LAB — BODY FLUID CELL COUNT WITH DIFFERENTIAL
Eos, Fluid: 1 %
Lymphs, Fluid: 59 %
Monocyte-Macrophage-Serous Fluid: 26 % — ABNORMAL LOW (ref 50–90)
Neutrophil Count, Fluid: 14 % (ref 0–25)
Other Cells, Fluid: 1 %
Total Nucleated Cell Count, Fluid: 262 cu mm (ref 0–1000)

## 2019-09-10 LAB — GLUCOSE, PLEURAL OR PERITONEAL FLUID: Glucose, Fluid: 119 mg/dL

## 2019-09-10 LAB — PHOSPHORUS: Phosphorus: 3.1 mg/dL (ref 2.5–4.6)

## 2019-09-10 LAB — VITAMIN B12: Vitamin B-12: 350 pg/mL (ref 180–914)

## 2019-09-10 LAB — MRSA PCR SCREENING: MRSA by PCR: NEGATIVE

## 2019-09-10 LAB — LACTATE DEHYDROGENASE, PLEURAL OR PERITONEAL FLUID: LD, Fluid: 44 U/L — ABNORMAL HIGH (ref 3–23)

## 2019-09-10 LAB — MAGNESIUM: Magnesium: 2.1 mg/dL (ref 1.7–2.4)

## 2019-09-10 LAB — LACTIC ACID, PLASMA: Lactic Acid, Venous: 1.6 mmol/L (ref 0.5–1.9)

## 2019-09-10 LAB — TSH: TSH: 1.877 u[IU]/mL (ref 0.350–4.500)

## 2019-09-10 MED ORDER — MEMANTINE HCL 10 MG PO TABS
5.0000 mg | ORAL_TABLET | Freq: Two times a day (BID) | ORAL | Status: DC
  Administered 2019-09-10 – 2019-09-13 (×6): 5 mg via ORAL
  Filled 2019-09-10 (×10): qty 1

## 2019-09-10 MED ORDER — APIXABAN 5 MG PO TABS
5.0000 mg | ORAL_TABLET | Freq: Two times a day (BID) | ORAL | Status: DC
  Administered 2019-09-10 – 2019-09-13 (×7): 5 mg via ORAL
  Filled 2019-09-10 (×7): qty 1

## 2019-09-10 MED ORDER — POLYVINYL ALCOHOL 1.4 % OP SOLN: 1.0000 [drp] | Freq: Every day | OPHTHALMIC | Status: DC | PRN

## 2019-09-10 MED ORDER — PANTOPRAZOLE SODIUM 40 MG PO TBEC
40.0000 mg | DELAYED_RELEASE_TABLET | Freq: Every day | ORAL | Status: DC
  Administered 2019-09-10 – 2019-09-13 (×4): 40 mg via ORAL
  Filled 2019-09-10 (×4): qty 1

## 2019-09-10 MED ORDER — DEXTROMETHORPHAN POLISTIREX ER 30 MG/5ML PO SUER
30.0000 mg | Freq: Two times a day (BID) | ORAL | Status: DC | PRN
  Filled 2019-09-10: qty 5

## 2019-09-10 MED ORDER — SODIUM CHLORIDE 0.9% FLUSH
3.0000 mL | Freq: Two times a day (BID) | INTRAVENOUS | Status: DC
  Administered 2019-09-11 – 2019-09-13 (×4): 3 mL via INTRAVENOUS

## 2019-09-10 MED ORDER — DILTIAZEM HCL 30 MG PO TABS
90.0000 mg | ORAL_TABLET | Freq: Three times a day (TID) | ORAL | Status: DC
  Administered 2019-09-10 – 2019-09-13 (×9): 90 mg via ORAL
  Filled 2019-09-10 (×9): qty 3

## 2019-09-10 MED ORDER — SODIUM CHLORIDE 0.9 % IV BOLUS
1000.0000 mL | Freq: Once | INTRAVENOUS | Status: AC
  Administered 2019-09-10: 1000 mL via INTRAVENOUS

## 2019-09-10 MED ORDER — ACETAMINOPHEN 325 MG PO TABS: 650.0000 mg | ORAL_TABLET | Freq: Four times a day (QID) | ORAL | Status: DC | PRN

## 2019-09-10 MED ORDER — HYDROCODONE-ACETAMINOPHEN 5-325 MG PO TABS
1.0000 | ORAL_TABLET | Freq: Once | ORAL | Status: AC
  Administered 2019-09-10: 1 via ORAL
  Filled 2019-09-10: qty 1

## 2019-09-10 MED ORDER — DILTIAZEM HCL 25 MG/5ML IV SOLN
10.0000 mg | Freq: Once | INTRAVENOUS | Status: AC
  Administered 2019-09-10: 10 mg via INTRAVENOUS

## 2019-09-10 MED ORDER — SODIUM CHLORIDE 0.9 % IV SOLN
500.0000 mg | INTRAVENOUS | Status: DC
  Administered 2019-09-10: 500 mg via INTRAVENOUS
  Filled 2019-09-10: qty 500

## 2019-09-10 MED ORDER — QUETIAPINE FUMARATE 25 MG PO TABS
25.0000 mg | ORAL_TABLET | Freq: Every day | ORAL | Status: DC
  Administered 2019-09-10 – 2019-09-12 (×3): 25 mg via ORAL
  Filled 2019-09-10 (×3): qty 1

## 2019-09-10 MED ORDER — METOPROLOL TARTRATE 25 MG PO TABS
25.0000 mg | ORAL_TABLET | Freq: Two times a day (BID) | ORAL | Status: DC
  Administered 2019-09-10 – 2019-09-13 (×6): 25 mg via ORAL
  Filled 2019-09-10 (×6): qty 1

## 2019-09-10 MED ORDER — AMIODARONE IV BOLUS ONLY 150 MG/100ML
150.0000 mg | Freq: Once | INTRAVENOUS | Status: AC
  Administered 2019-09-10: 150 mg via INTRAVENOUS
  Filled 2019-09-10: qty 100

## 2019-09-10 MED ORDER — SODIUM CHLORIDE 0.9 % IV SOLN: 250.0000 mL | INTRAVENOUS | Status: DC | PRN

## 2019-09-10 MED ORDER — SODIUM CHLORIDE 0.9% FLUSH: 3.0000 mL | INTRAVENOUS | Status: DC | PRN

## 2019-09-10 MED ORDER — MAGNESIUM OXIDE 400 (241.3 MG) MG PO TABS
400.0000 mg | ORAL_TABLET | Freq: Three times a day (TID) | ORAL | Status: DC
  Administered 2019-09-10 – 2019-09-13 (×8): 400 mg via ORAL
  Filled 2019-09-10 (×13): qty 1

## 2019-09-10 MED ORDER — SODIUM CHLORIDE 0.9 % IV SOLN
2.0000 g | Freq: Once | INTRAVENOUS | Status: AC
  Administered 2019-09-10: 2 g via INTRAVENOUS
  Filled 2019-09-10: qty 20

## 2019-09-10 MED ORDER — DILTIAZEM HCL 25 MG/5ML IV SOLN
5.0000 mg | Freq: Once | INTRAVENOUS | Status: AC
  Administered 2019-09-10: 5 mg via INTRAVENOUS
  Filled 2019-09-10: qty 5

## 2019-09-10 NOTE — Procedures (Signed)
  US guided Rt thoracentesis  1.2L golden fluid Sent for labs per MD  Tolerated well  EBL: 0

## 2019-09-10 NOTE — H&P (Signed)
History and Physical    Kristy Mckinney [id number] DOB: 06-06-1932 DOA: 09/10/2019  Referring MD/NP/PA: Dr. Sedonia Small PCP: [name], MD  Patient coming from: Home  Chief Complaint: Generalized weakness, shortness of breath.  HPI: Kristy Mckinney is a 86 y.o. female with past medical history significant for chronic diastolic heart failure, atrial fibrillation on chronic anticoagulation (Eliquis), essential hypertension, gastroesophageal flux disease, unspecified dementia with behavioral disturbances and vitamin D deficiency/osteoporosis; who presented to the hospital secondary to generalized weakness and increased shortness of breath.  Patient reports symptoms have been present for the last 3-4 days and worsening.  She reports no nausea, no vomiting, no chest pain, no abdominal pain, no fever, no rhinorrhea, no productive cough, no dysuria or significant changes in her weight.  Patient reports an ongoing pain in her back (which is chronic) and expressed having some difficulty sleeping and laying down flat for the last 2 days prior to admission.  In the ED work-up demonstrated loculated right pleural effusion and vascular congestion versus infiltrates; A. fib with RVR and acute respiratory failure with hypoxia requiring oxygen supplementation.  Blood work was otherwise unremarkable.  BNP/troponin ordered and pending at time of evaluation.  Patient initially received IV fluids in the ED and was empirically started antibiotics with concern for potential community-acquired pneumonia.  TR[facility] contacted to me patient for further evaluation and management of acute respiratory failure with hypoxia, loculated pleural effusion and A. fib with RVR.   Past Medical/Surgical History: Past Medical History:  Diagnosis Date  . Abdominal distension (gaseous)   . Acute on chronic diastolic heart failure ([place])   . Age-related osteoporosis without current pathological fracture   . Anemia, unspecified   . Anxiety  disorder, unspecified   . Atrial fibrillation ([place])   . Bronchitis, not specified as acute or chronic   . Chronic back pain   . Diarrhea   . Disorder of the skin and subcutaneous tissue, unspecified   . Dysphagia 05/03/2016  . Essential (primary) hypertension   . Gastro-esophageal reflux disease with esophagitis   . GERD (gastroesophageal reflux disease)   . Iron deficiency anemia, unspecified   . Irritable bowel syndrome with diarrhea   . Left temporomandibular joint disorder, unspecified   . Lumbago   . Pneumonia   . Shortness of breath   . Unspecified dementia without behavioral disturbance ([place])   . Unspecified fracture of unspecified wrist and hand, initial encounter for closed fracture   . Vitamin D deficiency, unspecified     Past Surgical History:  Procedure Laterality Date  . ABDOMINAL HYSTERECTOMY    . BACK SURGERY    . CHOLECYSTECTOMY N/A 01/29/2014   Procedure: LAPAROSCOPIC CHOLECYSTECTOMY;  Surgeon: Scherry Ran, MD;  Location: [facility];  Service: General;  Laterality: N/A;  . COLONOSCOPY N/A 10/27/2015   Procedure: COLONOSCOPY;  Surgeon: Rogene Houston, MD;  Location: [facility];  Service: Endoscopy;  Laterality: N/A;  215  . INTRAMEDULLARY (IM) NAIL INTERTROCHANTERIC Right 11/29/2018   Procedure: INTRAMEDULLARY (IM) NAIL INTERTROCHANTRIC FRACTURE;  Surgeon: Renette Butters, MD;  Location: [place];  Service: Orthopedics;  Laterality: Right;  . TONSILLECTOMY    . YAG LASER APPLICATION Left [id number]   Procedure: YAG LASER APPLICATION;  Surgeon: Elta Guadeloupe T. Gershon Crane, MD;  Location: [facility];  Service: Ophthalmology;  Laterality: Left;  left    Social History:  reports that she has never smoked. She has never used smokeless tobacco. She reports that she does not drink alcohol or  use drugs.  Allergies: Allergies  Allergen Reactions  . Penicillins Anaphylaxis    Did it involve swelling of the face/tongue/throat, SOB, or low BP? Unknown Did it involve sudden or  severe rash/hives, skin peeling, or any reaction on the inside of your mouth or nose? Unknown Did you need to seek medical attention at a hospital or doctor's office? Unknown When did it last happen? If all above answers are "NO", may proceed with cephalosporin use.   . Biaxin [Clarithromycin]   . Ciprofloxacin Nausea And Vomiting  . Emetrol Nausea Only    swelling  . Feldene [Piroxicam] Nausea And Vomiting  . Sulfa Antibiotics     swelling    Family History:  Family History  Problem Relation Age of Onset  . Hypertension Child     Prior to Admission medications   Medication Sig Start Date End Date Taking? Authorizing Provider  acetaminophen (TYLENOL) 325 MG tablet Take 650 mg by mouth every 6 (six) hours as needed. 12/05/18  Yes [provider]  alendronate (FOSAMAX) 70 MG tablet Take 1 tablet (70 mg total) by mouth once a week. Take with a full glass of water on an empty stomach. 12/23/18  Yes Gerlene Fee, NP  apixaban (ELIQUIS) 5 MG TABS tablet Take 1 tablet (5 mg total) by mouth 2 (two) times daily. 04/07/19  Yes [name], MD  calcium-vitamin D (OS-CAL 500 + D) 500-200 MG-UNIT tablet Take 1 tablet by mouth 2 (two) times daily. 12/05/18  Yes Oretha Milch D, MD  diltiazem (CARDIZEM CD) 360 MG 24 hr capsule Take 1 capsule (360 mg total) by mouth daily. 04/07/19  Yes [name], MD  magnesium oxide (MAG-OX) 400 MG tablet Take 1 tablet (400 mg total) by mouth 3 (three) times daily. 12/23/18  Yes Gerlene Fee, NP  memantine (NAMENDA) 5 MG tablet Take 1 tablet (5 mg total) by mouth 2 (two) times daily. 12/23/18  Yes Gerlene Fee, NP  metoprolol tartrate (LOPRESSOR) 25 MG tablet Take 1 tablet (25 mg total) by mouth 2 (two) times daily. 04/08/19  Yes [name], MD  omeprazole (PRILOSEC) 20 MG capsule Take 20 mg by mouth daily. 08/27/19  Yes [provider]  Polyethyl Glycol-Propyl Glycol (SYSTANE OP) Place 1 drop into both eyes daily as needed.  Dry Eyes    Yes [provider]  QUEtiapine (SEROQUEL) 25 MG tablet Take 1 tablet (25 mg total) by mouth at bedtime. 12/23/18  Yes Gerlene Fee, NP  sertraline (ZOLOFT) 50 MG tablet Take 1 tablet (50 mg total) by mouth every morning. 12/23/18  Yes Gerlene Fee, NP  torsemide (DEMADEX) 20 MG tablet Take 1/2   tablet by mouth daily.  If your weight goes up as much as 3 pounds take 2 daily 05/27/19  Yes Luffman, [place] M, PA-C  pantoprazole (PROTONIX) 40 MG tablet Take 1 tablet (40 mg total) by mouth daily. Patient not taking: Reported on 09/10/2019 04/19/19   Roxan Hockey, MD    Review of Systems:  Negative except as otherwise mentioned in HPI.    Physical Exam: Vitals:   09/10/19 1711 09/10/19 1715 09/10/19 1800 09/10/19 1810  BP: 103/89  (!) 141/122 (!) 138/98  Pulse:  75    Resp:  20 18   Temp:      SpO2:  97%    Weight:      Height:        Constitutional: Calm and in no major distress; still reporting some  heart racing activity.  No chest pain, no nausea, no vomiting, no abdominal pain.  Using 3 L nasal cannula supplementation, With good oxygen saturation Eyes: PERRL, lids and conjunctivae normal, no icterus, no nystagmus. ENMT: Mucous membranes are moist. Posterior pharynx clear of any exudate or lesions. Neck: normal, supple, no masses, no thyromegaly, no JVD on exam Respiratory: Decreased breath sounds at the bases, no wheezing, positive rhonchi.  No using accessory muscles.  Tachypnea appreciated on examination. Cardiovascular: Irregular irregular; positive systolic ejection murmur, no rubs, no gallops.  Trace edema bilaterally appreciated on exam. Abdomen: no tenderness, no masses palpated. No hepatosplenomegaly. Bowel sounds positive.  Musculoskeletal: no clubbing / cyanosis. No joint deformity upper and lower extremities. Good ROM, no contractures. Normal muscle tone.  Skin: no rashes, lesions, ulcers. No induration Neurologic: CN 2-12 grossly intact.  Sensation intact, DTR normal. Strength 5/5 in all 4.  Psychiatric: Normal judgment and insight. Alert and oriented to person, place and situation. Normal mood.    Labs on Admission: I have personally reviewed the following labs and imaging studies  CBC: Recent Labs  Lab 09/10/19 1253  WBC 9.2  HGB 10.5*  HCT 34.6*  MCV 84.4  PLT 123456   Basic Metabolic Panel: Recent Labs  Lab 09/10/19 1253  NA 140  K 4.0  CL 105  CO2 25  GLUCOSE 118*  BUN 21  CREATININE 0.82  CALCIUM 9.7   GFR: Estimated Creatinine Clearance: 44.3 mL/min (by C-G formula based on SCr of 0.82 mg/dL).   Liver Function Tests: Recent Labs  Lab 09/10/19 1253  AST 26  ALT 25  ALKPHOS 74  BILITOT 1.5*  PROT 7.5  ALBUMIN 3.9   Urine analysis:    Component Value Date/Time   COLORURINE YELLOW 09/10/2019 1248   APPEARANCEUR CLEAR 09/10/2019 1248   LABSPEC 1.018 09/10/2019 1248   PHURINE 6.0 09/10/2019 1248   GLUCOSEU NEGATIVE 09/10/2019 1248   HGBUR NEGATIVE 09/10/2019 [place] 09/10/2019 1248   KETONESUR NEGATIVE 09/10/2019 1248   PROTEINUR 100 (A) 09/10/2019 1248   NITRITE NEGATIVE 09/10/2019 1248   LEUKOCYTESUR NEGATIVE 09/10/2019 1248    Recent Results (from the past 240 hour(s))  Culture, blood (single) w Reflex to ID Panel     Status: None (Preliminary result)   Collection Time: 09/10/19 12:53 PM   Specimen: BLOOD RIGHT FOREARM  Result Value Ref Range Status   Specimen Description BLOOD RIGHT FOREARM  Final   Special Requests   Final    BOTTLES DRAWN AEROBIC AND ANAEROBIC Blood Culture adequate volume Performed at [facility], [address], [place], [place] [address]    Culture PENDING  Incomplete   Report Status PENDING  Incomplete  Gram stain     Status: None   Collection Time: 09/10/19  3:06 PM   Specimen: PATH Cytology Pleural fluid  Result Value Ref Range Status   Specimen Description PLEURAL  Final   Special Requests NONE  Final   Gram Stain   Final     NO ORGANISMS SEEN WBC PRESENT,BOTH PMN AND MONONUCLEAR CYTOSPIN SMEAR Performed at [facility], [address] [place], [place] [address]    Report Status 09/10/2019 FINAL  Final     Radiological Exams on Admission: DG Chest Port 1 View  Result Date: 09/10/2019 CLINICAL DATA:  Post RIGHT thoracentesis EXAM: PORTABLE CHEST 1 VIEW COMPARISON:  Portable exam 1555 hours compared to 09/10/2019 FINDINGS: Decrease in RIGHT pleural effusion and basilar atelectasis post thoracentesis. No pneumothorax. Enlargement  of cardiac silhouette with pulmonary vascular congestion. Perihilar infiltrates question pulmonary edema. Persistent LEFT pleural effusion and basilar atelectasis. Mitral annular calcification noted. Atherosclerotic calcification aorta. Bones demineralized. IMPRESSION: No pneumothorax following RIGHT thoracentesis. Electronically Signed   By: Lavonia Dana M.D.   On: 09/10/2019 16:15   DG Chest Port 1 View  Result Date: 09/10/2019 CLINICAL DATA:  Shortness of breath EXAM: PORTABLE CHEST 1 VIEW COMPARISON:  05/02/2019 FINDINGS: Heart size is mildly enlarged, partially obscured. Calcific aortic knob. Moderate loculated appearing right-sided pleural effusion. Small left pleural effusion. Streaky bibasilar opacities. No pneumothorax. IMPRESSION: 1. Moderate loculated appearing right-sided pleural effusion. 2. Small left pleural effusion. 3. Bibasilar opacities may reflect atelectasis and/or pneumonia. Electronically Signed   By: Davina Poke D.O.   On: 09/10/2019 13:11   CT RENAL STONE STUDY  Result Date: 09/10/2019 CLINICAL DATA:  Pain with urination, back pain EXAM: CT ABDOMEN AND PELVIS WITHOUT CONTRAST TECHNIQUE: Multidetector CT imaging of the abdomen and pelvis was performed following the standard protocol without IV contrast. COMPARISON:  04/13/2019 FINDINGS: Lower chest: Moderate right-sided pleural effusion with loculated component laterally. Small to moderate left-sided pleural effusion.  Patchy bibasilar opacities. Thoracic aortic and coronary artery atherosclerotic calcifications. Mitral valve annular calcification. Hepatobiliary: No focal liver abnormality is seen. Status post cholecystectomy. No biliary dilatation. Pancreas: Unremarkable. No pancreatic ductal dilatation or surrounding inflammatory changes. Spleen: Normal in size without focal abnormality. Adrenals/Urinary Tract: Unremarkable adrenal glands. A single tiny punctate calculus within the superior pole of the right kidney as well as the inferior pole of left kidney. No hydronephrosis. Ureters are nondilated. No ureteral calculi identified. Urinary bladder appears unremarkable. Stomach/Bowel: Moderate to large hiatal hernia. No dilated loops of bowel. Intramural fatty deposition within the cecum and right colon as well as the terminal ileum likely reflecting sequela of chronic inflammatory bowel disease. Appendix not visualized and may be surgically absent. Extensive diverticulosis of the sigmoid and descending colon. No focal colonic thickening or pericolonic inflammatory changes. Vascular/Lymphatic: Aortic atherosclerosis. No enlarged abdominal or pelvic lymph nodes. Reproductive: Status post hysterectomy. No adnexal masses. Other: No abdominal wall hernia or abnormality. No abdominopelvic ascites. Musculoskeletal: Chronic superior endplate compression deformity of L4. Prior right hip ORIF. No acute osseous findings. IMPRESSION: 1. Nonobstructing tiny bilateral renal calculi.  No hydronephrosis. 2. Moderate right-sided pleural effusion with loculated component. Small to moderate left-sided pleural effusion. Patchy bibasilar opacities may represent atelectasis or infiltrate. 3. Moderate to large hiatal hernia. 4. Extensive diverticulosis of the sigmoid and descending colon without evidence of acute diverticulitis. 5. Intramural fatty deposition within the cecum and right colon as well as the terminal ileum likely reflecting sequela of  chronic inflammatory bowel disease. No CT evidence of acute bowel inflammation. Aortic Atherosclerosis (ICD10-I70.0). Electronically Signed   By: Davina Poke D.O.   On: 09/10/2019 14:18   US THORACENTESIS ASP PLEURAL SPACE W/IMG GUIDE  Result Date: 09/10/2019 INDICATION: SOB Covid neg Rt Pleural effusion EXAM: ULTRASOUND GUIDED RIGHT THORACENTESIS MEDICATIONS: 10 cc 1% lidocaine. COMPLICATIONS: None immediate PROCEDURE: An ultrasound guided thoracentesis was thoroughly discussed with the patient and questions answered. The benefits, risks, alternatives and complications were also discussed. The patient understands and wishes to proceed with the procedure. Written consent was obtained. Ultrasound was performed to localize and mark an adequate pocket of fluid in the right chest. The area was then prepped and draped in the normal sterile fashion. 1% Lidocaine was used for local anesthesia. Under ultrasound guidance a 19 G Yueh catheter was introduced. Thoracentesis  was performed. The catheter was removed and a dressing applied. FINDINGS: A total of approximately 1.2 liters of golden fluid was removed. Samples were sent to the laboratory as requested by the clinical team. IMPRESSION: Successful ultrasound guided right thoracentesis yielding 1.2 L of pleural fluid. Read by Lavonia Drafts [facility] Electronically Signed   By: Lavonia Dana M.D.   On: 09/10/2019 15:47    EKG: Independently reviewed.  A. fib with RVR.  Assessment/Plan 1-Acute respiratory failure with hypoxia: In the setting of pleural effusion and A. fib with RVR -Patient will be admitted to a stepdown bed -Continuous telemetry monitoring -Will check BNP and TSH -Daily weights, low-sodium diet and strict I's and O's -Rate control attempted with IV Cardizem and X 1 dose amiodarone; subsequently will resume home beta-blocker dose and oral cardizem, last one at adjusted dose to prevent hypotension. -Consulting cardiology service for further  recommendation and future treatment. -Status post thoracentesis by radiology, 1.2 L removed, fluid analysis pending. -Normal WBCs, no fever, holding antibiotics at this time. -Follow electrolytes and replete them as needed.  2-Atrial fibrillation with RVR ([place]) -Treatment as mentioned above -Continue Eliquis for secondary prevention.  3-mild cognitive impairment with anxiety and depression -Was oriented x3 and very cooperative -A stable mood currently -Resume the use of Seroquel, sertraline and Namenda -Continue supportive care.  4-GERD without esophagitis -Continue PPI.  5-Hypomagnesemia -Check electrolytes and continue daily supplementation  6- Generalized weakness -In the setting of A. fib with RVR -Physical therapy evaluation requested -Start relies problem #1 as mentioned above.  7-Essential (primary) hypertension -Holding diuretics overnight -Blood pressure soft -Will attempt to continue metoprolol and Cardizem.  8-chronic diastolic heart failure -Based on low blood pressure we will initially hold diuretics -Follow daily weights, strict I's and O's and low-sodium diet -Resume diuretics management once vital signs stabilized.  DVT prophylaxis: chronically on eliquis.  Code Status: Full code Family Communication: Daughter Tandy Gaw contacted over the phone. Disposition Plan: To be determined; but hopefully discharge home once rate is controlled and breathing status is stabilized. Consults called: Cardiology; radiology service for thoracentesis. Admission status: Observation for now; stepdown unit in case we required the need of antiarrhythmic drips to control her atrial fibrillation; length of stay hopefully less than 2 midnights.   Time Spent: 70 minutes; patient presented with acute respiratory failure and hypoxia in the setting of A. fib with RVR and loculated pleural effusion; for now we will attempt thoracentesis and rate control management of her atrial  fibrillation with potential anticipated discharge home in the next 24 hours; if the patient failed to improve with/stabilizing this.  Time will ended requiring to be transition to inpatient status.  Barton Dubois MD [facility] Pager [phone]  09/10/2019, 6:30 PM

## 2019-09-10 NOTE — ED Provider Notes (Signed)
[facility] Emergency Department Provider Note MRN:  [id number]  Arrival date & time: 09/10/19     Chief Complaint   Shortness of Breath   History of Present Illness   Kristy Mckinney is a 85 y.o. year-old female with a history of A. fib presenting to the ED with chief complaint of shortness of breath.  Shortness of breath for the past week, thinks that her heart rate has been going fast for about 2 weeks.  Feeling generally unwell today.  Endorsing back pain, burning with urination.  Denies chest pain, no fever, mild cough, no abdominal pain.  Review of Systems  A complete 10 system review of systems was obtained and all systems are negative except as noted in the HPI and PMH.   Patient's Health History    Past Medical History:  Diagnosis Date  . Abdominal distension (gaseous)   . Acute on chronic diastolic heart failure ([place])   . Age-related osteoporosis without current pathological fracture   . Anemia, unspecified   . Anxiety disorder, unspecified   . Atrial fibrillation ([place])   . Bronchitis, not specified as acute or chronic   . Chronic back pain   . Diarrhea   . Disorder of the skin and subcutaneous tissue, unspecified   . Dysphagia 05/03/2016  . Essential (primary) hypertension   . Gastro-esophageal reflux disease with esophagitis   . GERD (gastroesophageal reflux disease)   . Iron deficiency anemia, unspecified   . Irritable bowel syndrome with diarrhea   . Left temporomandibular joint disorder, unspecified   . Lumbago   . Pneumonia   . Shortness of breath   . Unspecified dementia without behavioral disturbance ([place])   . Unspecified fracture of unspecified wrist and hand, initial encounter for closed fracture   . Vitamin D deficiency, unspecified     Past Surgical History:  Procedure Laterality Date  . ABDOMINAL HYSTERECTOMY    . BACK SURGERY    . CHOLECYSTECTOMY N/A 01/29/2014   Procedure: LAPAROSCOPIC CHOLECYSTECTOMY;  Surgeon:  Scherry Ran, MD;  Location: [facility];  Service: General;  Laterality: N/A;  . COLONOSCOPY N/A 10/27/2015   Procedure: COLONOSCOPY;  Surgeon: Rogene Houston, MD;  Location: [facility];  Service: Endoscopy;  Laterality: N/A;  215  . INTRAMEDULLARY (IM) NAIL INTERTROCHANTERIC Right 11/29/2018   Procedure: INTRAMEDULLARY (IM) NAIL INTERTROCHANTRIC FRACTURE;  Surgeon: Renette Butters, MD;  Location: [facility];  Service: Orthopedics;  Laterality: Right;  . TONSILLECTOMY    . YAG LASER APPLICATION Left [id number]   Procedure: YAG LASER APPLICATION;  Surgeon: Elta Guadeloupe T. Gershon Crane, MD;  Location: [facility];  Service: Ophthalmology;  Laterality: Left;  left    Family History  Problem Relation Age of Onset  . Hypertension Child     Social History   Socioeconomic History  . Marital status: Widowed    Spouse name: Not on file  . Number of children: Not on file  . Years of education: Not on file  . Highest education level: Not on file  Occupational History  . Occupation: retired  Tobacco Use  . Smoking status: Never Smoker  . Smokeless tobacco: Never Used  Substance and Sexual Activity  . Alcohol use: No  . Drug use: No  . Sexual activity: Yes    Birth control/protection: Surgical  Other Topics Concern  . Not on file  Social History Narrative   Pt lives in an apartment complex, reports that she still drives   Social Determinants of Health  Financial Resource Strain: Low Risk   . Difficulty of Paying Living Expenses: Not hard at all  Food Insecurity: No Food Insecurity  . Worried About Charity fundraiser in the Last Year: Never true  . Ran Out of Food in the Last Year: Never true  Transportation Needs: No Transportation Needs  . Lack of Transportation (Medical): No  . Lack of Transportation (Non-Medical): No  Physical Activity: Sufficiently Active  . Days of Exercise per Week: 5 days  . Minutes of Exercise per Session: 30 min  Stress: No Stress Concern Present  . Feeling of Stress :  Not at all  Social Connections: Slightly Isolated  . Frequency of Communication with Friends and Family: More than three times a week  . Frequency of Social Gatherings with Friends and Family: More than three times a week  . Attends Religious Services: More than 4 times per year  . Active Member of Clubs or Organizations: Yes  . Attends Archivist Meetings: More than 4 times per year  . Marital Status: Widowed  Intimate Partner Violence: Not At Risk  . Fear of Current or Ex-Partner: No  . Emotionally Abused: No  . Physically Abused: No  . Sexually Abused: No     Physical Exam   Vitals:   09/10/19 1230 09/10/19 1232  BP: 118/66   Pulse: (!) 134   Resp: (!) 29   Temp:    SpO2: 100% 97%    CONSTITUTIONAL: Well-appearing, NAD NEURO:  Alert and oriented x 3, no focal deficits EYES:  eyes equal and reactive ENT/NECK:  no LAD, no JVD CARDIO: Tachycardic rate, well-perfused, normal S1 and S2 PULM:  CTAB no wheezing or rhonchi GI/GU:  normal bowel sounds, non-distended, non-tender MSK/SPINE:  No gross deformities, no edema SKIN:  no rash, atraumatic PSYCH:  Appropriate speech and behavior  *Additional and/or pertinent findings included in MDM below  Diagnostic and Interventional Summary    EKG Interpretation  Date/Time:  Wednesday September 10 2019 12:17:33 EST Ventricular Rate:  140 PR Interval:    QRS Duration: 70 QT Interval:  264 QTC Calculation: 403 R Axis:   -39 Text Interpretation: Atrial fibrillation with rapid ventricular response Left axis deviation Low voltage QRS Inferior infarct , age undetermined Cannot rule out Anterior infarct , age undetermined Abnormal ECG Confirmed by Gerlene Fee [phone]) on 09/10/2019 12:30:11 PM      Cardiac Monitoring Interpretation:  Labs Reviewed  URINALYSIS, ROUTINE W REFLEX MICROSCOPIC - Abnormal; Notable for the following components:      Result Value   Protein, ur 100 (*)    All other components within normal limits    CBC - Abnormal; Notable for the following components:   Hemoglobin 10.5 (*)    HCT 34.6 (*)    MCH 25.6 (*)    RDW 16.7 (*)    All other components within normal limits  COMPREHENSIVE METABOLIC PANEL - Abnormal; Notable for the following components:   Glucose, Bld 118 (*)    Total Bilirubin 1.5 (*)    All other components within normal limits  CULTURE, BLOOD (SINGLE)  LACTIC ACID, PLASMA  POC SARS CORONAVIRUS 2 AG -  ED    CT RENAL STONE STUDY  Final Result    DG Chest Port 1 View  Final Result      Medications  cefTRIAXone (ROCEPHIN) 2 g in sodium chloride 0.9 % 100 mL IVPB (2 g Intravenous New Bag/Given 09/10/19 1416)  azithromycin (ZITHROMAX) 500 mg in sodium  chloride 0.9 % 250 mL IVPB (500 mg Intravenous New Bag/Given 09/10/19 1417)  sodium chloride 0.9 % bolus 1,000 mL (0 mLs Intravenous Stopped 09/10/19 1417)  diltiazem (CARDIZEM) injection 5 mg (5 mg Intravenous Given 09/10/19 1414)     Procedures  /  Critical Care .Critical Care Performed by: Maudie Flakes, MD Authorized by: Maudie Flakes, MD   Critical care provider statement:    Critical care time (minutes):  35   Critical care was necessary to treat or prevent imminent or life-threatening deterioration of the following conditions: A. fib with RVR.   Critical care was time spent personally by me on the following activities:  Discussions with consultants, evaluation of patient's response to treatment, examination of patient, ordering and performing treatments and interventions, ordering and review of laboratory studies, ordering and review of radiographic studies, pulse oximetry, re-evaluation of patient's condition, obtaining history from patient or surrogate and review of old charts    ED Course and Medical Decision Making  I have reviewed the triage vital signs, the nursing notes, and pertinent available records from the EMR.  Pertinent labs & imaging results that were available during my care of the patient were  reviewed by me and considered in my medical decision making (see below for details).     A. fib with RVR, considering UTI or pyelonephritis as possible underlying cause.  Will trial fluids, reassess.  Rates in the 140s, normotensive.  2:20 PM update: Minimal improvement with IV fluids, rates in the 130s.  Patient continues to look and feel well, normotensive.  Will trial a small dose of diltiazem.  Patient's chest x-ray is concerning for loculated pleural effusion, covered with antibiotics.  Patient continues to require 2 to 3 L nasal cannula.  Will admit to hospital service for rate control and further management.  Barth Kirks. [place] Small, MD [place] [email or web address]  Final Clinical Impressions(s) / ED Diagnoses     ICD-10-CM   1. Atrial fibrillation with RVR (HCC)  I48.91   2. Acute respiratory failure with hypoxia (HCC)  J96.01   3. Pleural effusion  J90     ED Discharge Orders    None       Discharge Instructions Discussed with and Provided to Patient:   Discharge Instructions   None       Maudie Flakes, MD 09/10/19 1423

## 2019-09-10 NOTE — ED Notes (Signed)
Pt  Hr will fluctuate between 109-155 during triage and is irregular

## 2019-09-10 NOTE — Plan of Care (Signed)

## 2019-09-10 NOTE — ED Triage Notes (Signed)
Pt c/o sob that started a few days along with strong odor with urination, back pain, pain with urination as well. Pt states " I just don't feel well"

## 2019-09-10 NOTE — Telephone Encounter (Signed)
Patient son called and said that patient fell last night and is now feeling nauseated and talking out of her head, spoke with the nurse Beverlee Nims and instructed him that she would need to go to the ER and be evaluated.

## 2019-09-11 DIAGNOSIS — Z79899 Other long term (current) drug therapy: Secondary | ICD-10-CM | POA: Diagnosis not present

## 2019-09-11 DIAGNOSIS — F419 Anxiety disorder, unspecified: Secondary | ICD-10-CM | POA: Diagnosis present

## 2019-09-11 DIAGNOSIS — K5790 Diverticulosis of intestine, part unspecified, without perforation or abscess without bleeding: Secondary | ICD-10-CM | POA: Diagnosis present

## 2019-09-11 DIAGNOSIS — Z20822 Contact with and (suspected) exposure to covid-19: Secondary | ICD-10-CM | POA: Diagnosis present

## 2019-09-11 DIAGNOSIS — I4821 Permanent atrial fibrillation: Secondary | ICD-10-CM | POA: Diagnosis present

## 2019-09-11 DIAGNOSIS — I4891 Unspecified atrial fibrillation: Secondary | ICD-10-CM | POA: Diagnosis not present

## 2019-09-11 DIAGNOSIS — F0391 Unspecified dementia with behavioral disturbance: Secondary | ICD-10-CM | POA: Diagnosis present

## 2019-09-11 DIAGNOSIS — J9601 Acute respiratory failure with hypoxia: Secondary | ICD-10-CM | POA: Diagnosis present

## 2019-09-11 DIAGNOSIS — M810 Age-related osteoporosis without current pathological fracture: Secondary | ICD-10-CM | POA: Diagnosis present

## 2019-09-11 DIAGNOSIS — N200 Calculus of kidney: Secondary | ICD-10-CM | POA: Diagnosis present

## 2019-09-11 DIAGNOSIS — Z9049 Acquired absence of other specified parts of digestive tract: Secondary | ICD-10-CM | POA: Diagnosis not present

## 2019-09-11 DIAGNOSIS — G8929 Other chronic pain: Secondary | ICD-10-CM | POA: Diagnosis present

## 2019-09-11 DIAGNOSIS — I110 Hypertensive heart disease with heart failure: Secondary | ICD-10-CM | POA: Diagnosis present

## 2019-09-11 DIAGNOSIS — Z7901 Long term (current) use of anticoagulants: Secondary | ICD-10-CM | POA: Diagnosis not present

## 2019-09-11 DIAGNOSIS — R4189 Other symptoms and signs involving cognitive functions and awareness: Secondary | ICD-10-CM | POA: Diagnosis present

## 2019-09-11 DIAGNOSIS — R0602 Shortness of breath: Secondary | ICD-10-CM | POA: Diagnosis present

## 2019-09-11 DIAGNOSIS — K219 Gastro-esophageal reflux disease without esophagitis: Secondary | ICD-10-CM | POA: Diagnosis present

## 2019-09-11 DIAGNOSIS — J918 Pleural effusion in other conditions classified elsewhere: Secondary | ICD-10-CM | POA: Diagnosis present

## 2019-09-11 DIAGNOSIS — F329 Major depressive disorder, single episode, unspecified: Secondary | ICD-10-CM | POA: Diagnosis present

## 2019-09-11 DIAGNOSIS — I5033 Acute on chronic diastolic (congestive) heart failure: Secondary | ICD-10-CM | POA: Diagnosis present

## 2019-09-11 DIAGNOSIS — I10 Essential (primary) hypertension: Secondary | ICD-10-CM | POA: Diagnosis not present

## 2019-09-11 DIAGNOSIS — Z90710 Acquired absence of both cervix and uterus: Secondary | ICD-10-CM | POA: Diagnosis not present

## 2019-09-11 LAB — BASIC METABOLIC PANEL
Anion gap: 8 (ref 5–15)
BUN: 21 mg/dL (ref 8–23)
CO2: 27 mmol/L (ref 22–32)
Calcium: 9.1 mg/dL (ref 8.9–10.3)
Chloride: 107 mmol/L (ref 98–111)
Creatinine, Ser: 0.92 mg/dL (ref 0.44–1.00)
GFR calc Af Amer: >60 mL/min (ref >60)
GFR calc non Af Amer: 57 mL/min — ABNORMAL LOW (ref >60)
Glucose, Bld: 104 mg/dL — ABNORMAL HIGH (ref 70–99)
Potassium: 4 mmol/L (ref 3.5–5.1)
Sodium: 142 mmol/L (ref 135–145)

## 2019-09-11 LAB — GRAM STAIN

## 2019-09-11 LAB — VITAMIN D 25 HYDROXY (VIT D DEFICIENCY, FRACTURES): Vit D, 25-Hydroxy: 30.76 ng/mL (ref 30–100)

## 2019-09-11 LAB — TRIGLYCERIDES, BODY FLUIDS: Triglycerides, Fluid: 14 mg/dL

## 2019-09-11 MED ORDER — FUROSEMIDE 10 MG/ML IJ SOLN
20.0000 mg | Freq: Two times a day (BID) | INTRAMUSCULAR | Status: DC
  Administered 2019-09-11 – 2019-09-12 (×3): 20 mg via INTRAVENOUS
  Filled 2019-09-11 (×4): qty 2

## 2019-09-11 MED ORDER — DIPHENHYDRAMINE HCL 12.5 MG/5ML PO ELIX
6.2500 mg | ORAL_SOLUTION | Freq: Once | ORAL | Status: AC
  Administered 2019-09-11: 6.25 mg via ORAL
  Filled 2019-09-11: qty 5

## 2019-09-11 MED ORDER — CHLORHEXIDINE GLUCONATE CLOTH 2 % EX PADS
6.0000 | MEDICATED_PAD | Freq: Every day | CUTANEOUS | Status: DC
  Administered 2019-09-11 – 2019-09-13 (×3): 6 via TOPICAL

## 2019-09-11 NOTE — Evaluation (Signed)
Physical Therapy Evaluation Patient Details Name: Kristy Mckinney MRN: [id number] DOB: 1932-07-10 Today's Date: 09/11/2019   History of Present Illness  Kristy Mckinney is a 86 y.o. female with past medical history significant for chronic diastolic heart failure, atrial fibrillation on chronic anticoagulation (Eliquis), essential hypertension, gastroesophageal flux disease, unspecified dementia with behavioral disturbances and vitamin D deficiency/osteoporosis; who presented to the hospital secondary to generalized weakness and increased shortness of breath.  Patient reports symptoms have been present for the last 3-4 days and worsening.  She reports no nausea, no vomiting, no chest pain, no abdominal pain, no fever, no rhinorrhea, no productive cough, no dysuria or significant changes in her weight.  Patient reports an ongoing pain in her back (which is chronic) and expressed having some difficulty sleeping and laying down flat for the last 2 days prior to admission. In the ED work-up demonstrated loculated right pleural effusion and vascular congestion versus infiltrates; A. fib with RVR and acute respiratory failure with hypoxia requiring oxygen supplementation.  Blood work was otherwise unremarkable.  BNP/troponin ordered and pending at time of evaluation.  Patient initially received IV fluids in the ED and was empirically started antibiotics with concern for potential community-acquired pneumonia.  [facility] contacted to me patient for further evaluation and management of acute respiratory failure with hypoxia, loculated pleural effusion and A. fib with RVR.    Clinical Impression  Patient demonstrates good return for ambulating in room and transferring to commode, standing in front of sink to wash hands all without loss of balance.  Patient limited to ambulating in room due to c/o fatigue and SpO2 dropping from 95% to 86% with activity.  Patient tolerated sitting up in chair after therapy - RN aware.   Patient will benefit from continued physical therapy in hospital and recommended venue below to increase strength, balance, endurance for safe ADLs and gait.     Follow Up Recommendations Home health PT;Supervision - Intermittent    Equipment Recommendations  None recommended by PT    Recommendations for Other Services       Precautions / Restrictions Precautions Precautions: Fall Restrictions Weight Bearing Restrictions: No      Mobility  Bed Mobility Overal bed mobility: Needs Assistance Bed Mobility: Supine to Sit     Supine to sit: Modified independent (Device/Increase time)     General bed mobility comments: slightly increased time  Transfers Overall transfer level: Needs assistance Equipment used: 1 person hand held assist;None Transfers: Sit to/from [facility] Sit to Stand: Min guard;Supervision Stand pivot transfers: Min guard       General transfer comment: slightly labored movement, increased time  Ambulation/Gait Ambulation/Gait assistance: Min guard;Supervision Gait Distance (Feet): 12 Feet Assistive device: 1 person hand held assist;None Gait Pattern/deviations: Decreased step length - right;Decreased step length - left;Decreased stride length Gait velocity: decreased   General Gait Details: limited to ambulating to bathroom in room due to c/o fatigue with SpO2 dropping from 95% to 85% with exertion, demonstrates slightly labored cadence without loss of balance  Stairs            Wheelchair Mobility    Modified Rankin (Stroke Patients Only)       Balance Overall balance assessment: Needs assistance Sitting-balance support: Feet supported;No upper extremity supported Sitting balance-Leahy Scale: Good Sitting balance - Comments: seated at EOB   Standing balance support: During functional activity;No upper extremity supported Standing balance-Leahy Scale: Fair Standing balance comment: fair/good with hand held assist  Pertinent Vitals/Pain Pain Assessment: No/denies pain    Home Living Family/patient expects to be discharged to:: Private residence Living Arrangements: Alone Available Help at Discharge: Family;Available PRN/intermittently Type of Home: Apartment Home Access: Level entry     Home Layout: One level Home Equipment: Cane - single point;Shower seat;Bedside commode;Walker - 2 wheels      Prior Function Level of Independence: Independent with assistive device(s)         Comments: household and short distanced community ambulator with RW/SPC PRN     Hand Dominance   Dominant Hand: Right    Extremity/Trunk Assessment   Upper Extremity Assessment Upper Extremity Assessment: Generalized weakness    Lower Extremity Assessment Lower Extremity Assessment: Generalized weakness    Cervical / Trunk Assessment Cervical / Trunk Assessment: Normal  Communication   Communication: No difficulties  Cognition Arousal/Alertness: Awake/alert Behavior During Therapy: WFL for tasks assessed/performed Overall Cognitive Status: Within Functional Limits for tasks assessed                                        General Comments      Exercises     Assessment/Plan    PT Assessment Patient needs continued PT services  PT Problem List Decreased strength;Decreased activity tolerance;Decreased balance;Decreased mobility;Cardiopulmonary status limiting activity       PT Treatment Interventions Balance training;Gait training;Stair training;Functional mobility training;Therapeutic activities;Therapeutic exercise;Patient/family education    PT Goals (Current goals can be found in the Care Plan section)  Acute Rehab PT Goals Patient Stated Goal: return home with family to assist PT Goal Formulation: With patient Time For Goal Achievement: 09/18/19 Potential to Achieve Goals: Good    Frequency Min 3X/week   Barriers to discharge         Co-evaluation               AM-PAC PT "6 Clicks" Mobility  Outcome Measure Help needed turning from your back to your side while in a flat bed without using bedrails?: None Help needed moving from lying on your back to sitting on the side of a flat bed without using bedrails?: None Help needed moving to and from a bed to a chair (including a wheelchair)?: A Little Help needed standing up from a chair using your arms (e.g., wheelchair or bedside chair)?: A Little Help needed to walk in hospital room?: A Little Help needed climbing 3-5 steps with a railing? : A Little 6 Click Score: 20    End of Session Equipment Utilized During Treatment: Oxygen Activity Tolerance: Patient tolerated treatment well;Patient limited by fatigue Patient left: in chair;with call bell/phone within reach Nurse Communication: Mobility status PT Visit Diagnosis: Unsteadiness on feet (R26.81);Other abnormalities of gait and mobility (R26.89);Muscle weakness (generalized) (M62.81)    Time: [id number] PT Time Calculation (min) (ACUTE ONLY): 23 min   Charges:   PT Evaluation $PT Eval Moderate Complexity: 1 Mod PT Treatments $Therapeutic Activity: 23-37 mins        12:30 PM, 09/11/19 Lonell Grandchild, MPT Physical Therapist with [facility] [phone] office [phone] mobile phone

## 2019-09-11 NOTE — Plan of Care (Signed)
  Problem: Acute Rehab PT Goals(only PT should resolve) Goal: Pt Will Go Supine/Side To Sit Outcome: Progressing Flowsheets (Taken 09/11/2019 1231) Pt will go Supine/Side to Sit: Independently Goal: Patient Will Transfer Sit To/From Stand Outcome: Progressing Flowsheets (Taken 09/11/2019 1231) Patient will transfer sit to/from stand: with modified independence Goal: Pt Will Transfer Bed To Chair/Chair To Bed Outcome: Progressing Flowsheets (Taken 09/11/2019 1231) Pt will Transfer Bed to Chair/Chair to Bed: with modified independence Goal: Pt Will Ambulate Outcome: Progressing Flowsheets (Taken 09/11/2019 1231) Pt will Ambulate:  75 feet  with supervision  with cane   12:32 PM, 09/11/19 Lonell Grandchild, MPT Physical Therapist with [facility] [phone] office [phone] mobile phone

## 2019-09-11 NOTE — Progress Notes (Signed)
PROGRESS NOTE    Kristy Mckinney  [id number] DOB: [birth year] DOA: 09/10/2019 PCP: [name], MD     Brief Narrative:  86 y.o. female with past medical history significant for chronic diastolic heart failure, atrial fibrillation on chronic anticoagulation (Eliquis), essential hypertension, gastroesophageal flux disease, unspecified dementia with behavioral disturbances and vitamin D deficiency/osteoporosis; who presented to the hospital secondary to generalized weakness and increased shortness of breath.  Patient reports symptoms have been present for the last 3-4 days and worsening.  She reports no nausea, no vomiting, no chest pain, no abdominal pain, no fever, no rhinorrhea, no productive cough, no dysuria or significant changes in her weight.  Patient reports an ongoing pain in her back (which is chronic) and expressed having some difficulty sleeping and laying down flat for the last 2 days prior to admission.  In the ED work-up demonstrated loculated right pleural effusion and vascular congestion versus infiltrates; A. fib with RVR and acute respiratory failure with hypoxia requiring oxygen supplementation.  Blood work was otherwise unremarkable.  BNP/troponin ordered and pending at time of evaluation.  Patient initially received IV fluids in the ED and was empirically started antibiotics with concern for potential community-acquired pneumonia.  [facility] contacted to me patient for further evaluation and management of acute respiratory failure with hypoxia, loculated pleural effusion and A. fib with RVR.    Assessment & Plan: 1-acute respiratory failure with hypoxia: In the setting of A. fib with RVR, flash pulmonary edema, vascular congestion and mild acute on chronic diastolic heart failure. -Right improved currently after receiving IV Cardizem and a one-time dose of amiodarone IV on admission -Continue Cardizem and current dose of metoprolol -Continue apixaban for secondary  prevention -For now diastolic acute on chronic exacerbation will provide IV Lasix 20 mg IV every 12 hours x2 doses and reassess volume status closely. -Continue low-sodium diet, daily weight and strict I's and O's. -Pleural effusion tapped and 1.2 L removed; fluid analysis suggesting CHF as origin for fluid buildup.  No signs of infection. -continue holding antibiotics -Continue to wean oxygen supplementation as tolerated.  2-hypomagnesemia -Continue to follow electrolytes and continue daily supplementation.  3-cognitive impairment/dementia with behavioral disorder: Including anxiety and depression -Moderate stable and well-controlled: -Continue the use of home psychotropic medications (including: Seroquel, sertraline and Namenda). -Continue supportive care.  4-essential hypertension -Blood pressure soft but overall stable -Continue to watch closely to prevent hypotension.  5-generalized weakness -Patient has been seen by physical therapy who has recommended home health PT at time of discharge.  6-gastroesophageal flux disease -Continue PPI.  DVT prophylaxis: Apixaban Code Status: Full code Family Communication: Daughter updated on patient's condition at time of admission; no family member at bedside during this visit. Disposition Plan: Remains in a stepdown unit; continue diltiazem and metoprolol at current dose in an effort to assist controlling patient atrial fibrillation rate; continue apixaban for secondary prevention. Will start gentle IV diuresis to stabilize volume status and hopefully discontinue oxygen supplementation that so far she is still requiring.  Follow clinical response and continue supportive care.  Continue monitoring on telemetry and follow electrolytes and renal function closely.  Patient is currently still not ready for discharge.  Physical therapy has seen patient and she will go home with home health PT at time of discharge.  Consultants:   Cardiology  service  Procedures:   See below for x-ray reports.  Antimicrobials:  Anti-infectives (From admission, onward)   Start     Dose/Rate Route Frequency Ordered Stop  09/10/19 1415  azithromycin (ZITHROMAX) 500 mg in sodium chloride 0.9 % 250 mL IVPB  Status:  Discontinued     500 mg 250 mL/hr over 60 Minutes Intravenous Every 24 hours 09/10/19 1336 09/10/19 1821   09/10/19 1345  cefTRIAXone (ROCEPHIN) 2 g in sodium chloride 0.9 % 100 mL IVPB     2 g 200 mL/hr over 30 Minutes Intravenous  Once 09/10/19 1336 09/10/19 1711      Subjective: Afebrile, no chest pain, no nausea, no vomiting.  Reports breathing slightly better this morning but is still requiring oxygen supplementation, tachypnea appreciated on minimal exertion and is still also with uncontrolled heart rate.  Objective: Vitals:   09/11/19 1200 09/11/19 1300 09/11/19 1400 09/11/19 1447  BP: 124/88 108/70 (!) 95/56   Pulse: 95  (!) 110 93  Resp: (!) 22 (!) 27 (!) 27 (!) 25  Temp:      TempSrc:      SpO2: 94%  91% 92%  Weight:      Height:        Intake/Output Summary (Last 24 hours) at 09/11/2019 1523 Last data filed at 09/11/2019 1416 Gross per 24 hour  Intake 720 ml  Output 700 ml  Net 20 ml   Filed Weights   09/10/19 1214 09/10/19 1858 09/11/19 0500  Weight: 60.8 kg 61.7 kg 62 kg    Examination: General exam: Alert, awake, oriented x 3, still feeling short of breath and with mild heart racing sensation; no chest pain, no nausea, no vomiting, no abdominal pain.  Using 3 L nasal cannula supplementation to maintain O2 sats above 92%. Respiratory system: Fine crackles at the bases; positive tachypnea on exertion, no wheezing, no using accessory muscles. Cardiovascular system: Irregular irregular, positive systolic ejection murmur, no rubs, no gallops, no JVD on exam.  Gastrointestinal system: Abdomen is nondistended, soft and nontender. No organomegaly or masses felt. Normal bowel sounds heard. Central nervous  system: Alert and oriented. No focal neurological deficits. Extremities: No cyanosis or clubbing. Skin: No rashes, lesions or ulcers Psychiatry: Judgement and insight appear normal. Mood & affect appropriate.     Data Reviewed: I have personally reviewed following labs and imaging studies  CBC: Recent Labs  Lab 09/10/19 1253  WBC 9.2  HGB 10.5*  HCT 34.6*  MCV 84.4  PLT 123456   Basic Metabolic Panel: Recent Labs  Lab 09/10/19 1253 09/11/19 0446  NA 140 142  K 4.0 4.0  CL 105 107  CO2 25 27  GLUCOSE 118* 104*  BUN 21 21  CREATININE 0.82 0.92  CALCIUM 9.7 9.1  MG 2.1  --   PHOS 3.1  --    GFR: Estimated Creatinine Clearance: 38.6 mL/min (by C-G formula based on SCr of 0.92 mg/dL). Liver Function Tests: Recent Labs  Lab 09/10/19 1253  AST 26  ALT 25  ALKPHOS 74  BILITOT 1.5*  PROT 7.5  ALBUMIN 3.9   Thyroid Function Tests: Recent Labs    09/10/19 1253  TSH 1.877   Anemia Panel: Recent Labs    09/10/19 2047  VITAMINB12 350   Urine analysis:    Component Value Date/Time   COLORURINE YELLOW 09/10/2019 1248   APPEARANCEUR CLEAR 09/10/2019 1248   LABSPEC 1.018 09/10/2019 1248   PHURINE 6.0 09/10/2019 1248   GLUCOSEU NEGATIVE 09/10/2019 1248   HGBUR NEGATIVE 09/10/2019 1248   BILIRUBINUR NEGATIVE 09/10/2019 1248   KETONESUR NEGATIVE 09/10/2019 1248   PROTEINUR 100 (A) 09/10/2019 1248   NITRITE NEGATIVE 09/10/2019 1248  LEUKOCYTESUR NEGATIVE 09/10/2019 1248    Recent Results (from the past 240 hour(s))  Culture, blood (single) w Reflex to ID Panel     Status: None (Preliminary result)   Collection Time: 09/10/19 12:53 PM   Specimen: BLOOD RIGHT FOREARM  Result Value Ref Range Status   Specimen Description BLOOD RIGHT FOREARM  Final   Special Requests   Final    BOTTLES DRAWN AEROBIC AND ANAEROBIC Blood Culture adequate volume   Culture   Final    NO GROWTH < 24 HOURS Performed at [facility], [address], [place], [place] [address]     Report Status PENDING  Incomplete  Gram stain     Status: None   Collection Time: 09/10/19  3:06 PM   Specimen: PATH Cytology Pleural fluid  Result Value Ref Range Status   Specimen Description PLEURAL  Final   Special Requests BOTTLES DRAWN AEROBIC AND ANAEROBIC  Final   Gram Stain   Final    NO ORGANISMS SEEN WBC PRESENT,BOTH PMN AND MONONUCLEAR CYTOSPIN SMEAR Performed at [facility], [address] [place], [place] [address]    Report Status 09/10/2019 FINAL  Final  Culture, body fluid-bottle     Status: None (Preliminary result)   Collection Time: 09/10/19  3:06 PM   Specimen: Pleura  Result Value Ref Range Status   Specimen Description PLEURAL  Final   Special Requests   Final    BOTTLES DRAWN AEROBIC AND ANAEROBIC Blood Culture adequate volume   Culture   Final    NO GROWTH < 24 HOURS Performed at [facility], [address] [place], [place] [address]    Report Status PENDING  Incomplete  MRSA PCR Screening     Status: None   Collection Time: 09/10/19  6:58 PM   Specimen: Nasopharyngeal  Result Value Ref Range Status   MRSA by PCR NEGATIVE NEGATIVE Final    Comment:        The GeneXpert MRSA Assay (FDA approved for NASAL specimens only), is one component of a comprehensive MRSA colonization surveillance program. It is not intended to diagnose MRSA infection nor to guide or monitor treatment for MRSA infections. Performed at [facility], [address] [place], [place] [address]      Radiology Studies: [facility] Chest [facility] 1 View  Result Date: 09/10/2019 CLINICAL DATA:  Post RIGHT thoracentesis EXAM: PORTABLE CHEST 1 VIEW COMPARISON:  Portable exam 1555 hours compared to 09/10/2019 FINDINGS: Decrease in RIGHT pleural effusion and basilar atelectasis post thoracentesis. No pneumothorax. Enlargement of cardiac silhouette with pulmonary vascular congestion. Perihilar infiltrates question pulmonary edema. Persistent LEFT pleural effusion and basilar atelectasis. Mitral  annular calcification noted. Atherosclerotic calcification aorta. Bones demineralized. IMPRESSION: No pneumothorax following RIGHT thoracentesis. Electronically Signed   By: Lavonia Dana M.D.   On: 09/10/2019 16:15   DG Chest Port 1 View  Result Date: 09/10/2019 CLINICAL DATA:  Shortness of breath EXAM: PORTABLE CHEST 1 VIEW COMPARISON:  05/02/2019 FINDINGS: Heart size is mildly enlarged, partially obscured. Calcific aortic knob. Moderate loculated appearing right-sided pleural effusion. Small left pleural effusion. Streaky bibasilar opacities. No pneumothorax. IMPRESSION: 1. Moderate loculated appearing right-sided pleural effusion. 2. Small left pleural effusion. 3. Bibasilar opacities may reflect atelectasis and/or pneumonia. Electronically Signed   By: Davina Poke D.O.   On: 09/10/2019 13:11   CT RENAL STONE STUDY  Result Date: 09/10/2019 CLINICAL DATA:  Pain with urination, back pain EXAM: CT ABDOMEN AND PELVIS WITHOUT CONTRAST TECHNIQUE: Multidetector CT imaging of the  abdomen and pelvis was performed following the standard protocol without IV contrast. COMPARISON:  04/13/2019 FINDINGS: Lower chest: Moderate right-sided pleural effusion with loculated component laterally. Small to moderate left-sided pleural effusion. Patchy bibasilar opacities. Thoracic aortic and coronary artery atherosclerotic calcifications. Mitral valve annular calcification. Hepatobiliary: No focal liver abnormality is seen. Status post cholecystectomy. No biliary dilatation. Pancreas: Unremarkable. No pancreatic ductal dilatation or surrounding inflammatory changes. Spleen: Normal in size without focal abnormality. Adrenals/Urinary Tract: Unremarkable adrenal glands. A single tiny punctate calculus within the superior pole of the right kidney as well as the inferior pole of left kidney. No hydronephrosis. Ureters are nondilated. No ureteral calculi identified. Urinary bladder appears unremarkable. Stomach/Bowel: Moderate to  large hiatal hernia. No dilated loops of bowel. Intramural fatty deposition within the cecum and right colon as well as the terminal ileum likely reflecting sequela of chronic inflammatory bowel disease. Appendix not visualized and may be surgically absent. Extensive diverticulosis of the sigmoid and descending colon. No focal colonic thickening or pericolonic inflammatory changes. Vascular/Lymphatic: Aortic atherosclerosis. No enlarged abdominal or pelvic lymph nodes. Reproductive: Status post hysterectomy. No adnexal masses. Other: No abdominal wall hernia or abnormality. No abdominopelvic ascites. Musculoskeletal: Chronic superior endplate compression deformity of L4. Prior right hip ORIF. No acute osseous findings. IMPRESSION: 1. Nonobstructing tiny bilateral renal calculi.  No hydronephrosis. 2. Moderate right-sided pleural effusion with loculated component. Small to moderate left-sided pleural effusion. Patchy bibasilar opacities may represent atelectasis or infiltrate. 3. Moderate to large hiatal hernia. 4. Extensive diverticulosis of the sigmoid and descending colon without evidence of acute diverticulitis. 5. Intramural fatty deposition within the cecum and right colon as well as the terminal ileum likely reflecting sequela of chronic inflammatory bowel disease. No CT evidence of acute bowel inflammation. Aortic Atherosclerosis (ICD10-I70.0). Electronically Signed   By: Davina Poke D.O.   On: 09/10/2019 14:18   US THORACENTESIS ASP PLEURAL SPACE W/IMG GUIDE  Result Date: 09/10/2019 INDICATION: SOB Covid neg Rt Pleural effusion EXAM: ULTRASOUND GUIDED RIGHT THORACENTESIS MEDICATIONS: 10 cc 1% lidocaine. COMPLICATIONS: None immediate PROCEDURE: An ultrasound guided thoracentesis was thoroughly discussed with the patient and questions answered. The benefits, risks, alternatives and complications were also discussed. The patient understands and wishes to proceed with the procedure. Written consent was  obtained. Ultrasound was performed to localize and mark an adequate pocket of fluid in the right chest. The area was then prepped and draped in the normal sterile fashion. 1% Lidocaine was used for local anesthesia. Under ultrasound guidance a 19 G Yueh catheter was introduced. Thoracentesis was performed. The catheter was removed and a dressing applied. FINDINGS: A total of approximately 1.2 liters of golden fluid was removed. Samples were sent to the laboratory as requested by the clinical team. IMPRESSION: Successful ultrasound guided right thoracentesis yielding 1.2 L of pleural fluid. Read by Lavonia Drafts [facility] Electronically Signed   By: Lavonia Dana M.D.   On: 09/10/2019 15:47    Scheduled Meds: . apixaban  5 mg Oral BID  . Chlorhexidine Gluconate Cloth  6 each Topical Daily  . diltiazem  90 mg Oral Q8H  . furosemide  20 mg Intravenous Q12H  . magnesium oxide  400 mg Oral TID  . memantine  5 mg Oral BID  . metoprolol tartrate  25 mg Oral BID  . pantoprazole  40 mg Oral Daily  . QUEtiapine  25 mg Oral QHS  . sodium chloride flush  3 mL Intravenous Q12H   Continuous Infusions: . sodium chloride  LOS: 0 days    Time spent: 35 minutes. Greater than 50% of this time was spent in direct contact with the patient, coordinating care and discussing relevant ongoing clinical issues, including acute respiratory failure with hypoxia, atrial fibrillation with rapid ventricular response, vascular congestion, pleural effusion and acute flash pulmonary edema.  Case was discussed with cardiology service and will follow further recommendations.  At this moment we will continue treatment with medications for rate control and also use IV Lasix for mild exacerbation of diastolic heart failure.  Physical therapy has seen patient and has recommended physical therapy at home at time of discharge to assist with transition of care and conditioning.  At this moment she continued to be in atrial fibrillation  and her rate is better controlled today; crackles have been appreciated on her physical exam and will require IV Lasix for diuresis to stabilize volume status and improve her breathing improving.  Still requiring oxygen supplementation 3L.    Barton Dubois, MD [facility] Pager [phone]   09/11/2019, 3:23 PM

## 2019-09-11 NOTE — Consult Note (Addendum)
Cardiology Consult    Patient ID: Kristy Mckinney; [id number]; 1932/04/27   Admit date: 09/10/2019 Date of Consult: 09/11/2019  Primary Care Provider: [name], MD Primary Cardiologist: Kristy Lesches, MD   Patient Profile    Kristy Mckinney is a 86 y.o. female with past medical history of permanent atrial fibrillation, chronic diastolic CHF, mild AS, mild MS and chronic back pain who is being seen today for the evaluation of atrial fibrillation with RVR at the request of Dr. Dyann Mckinney.   History of Present Illness    Kristy Mckinney was last examined by myself in 07/2019 and had baseline dyspnea on exertion but denied any chest pain or palpitations. Weight was stable at 136 lbs and she was continued on Torsemide 10mg  daily with instructions to take an extra tablet as needed for edema or weight gain. Was continued on Eliquis 5mg  BID along with Cardizem CD 360mg  daily and Lopressor 25mg  BID.   She presented to [facility] ED on 09/10/2019 after suffering a fall the night before and developing nausea and AMS per family reports. The patient also reported painful and malodorous urination. In talking with the patient today, she reports she had been doing well until two days prior to admission. During that time frame, she developed acute worsening dyspnea on exertion and weakness. Reports orthopnea and had difficulty sleeping at night but she thinks this is due to Seroquel being discontinued by her PCP. She has experienced a dry cough. She checks weights daily and this has been at 134 - 136 lbs. She has not had to take an extra Torsemide in several weeks. Denies any chest pain or palpitations. Reports compliance with her medications and denies missing any doses.   Initial labs showed WBC 9.2, Hgb 10.5, platelets 274, Na+ 140, K+ 4.0 and creatinine 0.82. Lactic Acid 1.6. UA negative for UTI. BNP 1555. TSH 1.877. Mg 2.1. Negative for COVID-19. CXR showed a moderate loculated right-sided pleural effusion  and she underwent thoracentesis with 1.2L of fluid removed. CT Renal study showed nonobstructing renal calculi with no hydronephrosis. Effusion was present along with a moderate to large hiatal hernia and diverticulosis without evidence of diverticulitis.   She was in atrial fibrillation with RVR upon arrival and initial EKG showed atrial fibrillation with RVR, HR 140 with LAD.   She received IV Cardizem 5mg  followed by 10mg  in the ED. Was later transitioned to short-acting Cardizem 90mg  TID along with being continued on Lopressor 25mg  BID. Received a one-time dose of IV Amiodarone 150mg  yesterday evening. By review of telemetry, rates have improved in the 80's to 110's since admission.   Was also started on IV Lasix 20mg  BID with initial dose scheduled for this AM.    Past Medical History:  Diagnosis Date  . Abdominal distension (gaseous)   . Acute on chronic diastolic heart failure ([place])   . Age-related osteoporosis without current pathological fracture   . Anemia, unspecified   . Anxiety disorder, unspecified   . Atrial fibrillation ([place])   . Bronchitis, not specified as acute or chronic   . Chronic back pain   . Diarrhea   . Disorder of the skin and subcutaneous tissue, unspecified   . Dysphagia 05/03/2016  . Essential (primary) hypertension   . Gastro-esophageal reflux disease with esophagitis   . GERD (gastroesophageal reflux disease)   . Iron deficiency anemia, unspecified   . Irritable bowel syndrome with diarrhea   . Left temporomandibular joint disorder, unspecified   .  Lumbago   . Pneumonia   . Shortness of breath   . Unspecified dementia without behavioral disturbance ([place])   . Unspecified fracture of unspecified wrist and hand, initial encounter for closed fracture   . Vitamin D deficiency, unspecified     Past Surgical History:  Procedure Laterality Date  . ABDOMINAL HYSTERECTOMY    . BACK SURGERY    . CHOLECYSTECTOMY N/A 01/29/2014   Procedure: LAPAROSCOPIC  CHOLECYSTECTOMY;  Surgeon: Scherry Ran, MD;  Location: [facility];  Service: General;  Laterality: N/A;  . COLONOSCOPY N/A 10/27/2015   Procedure: COLONOSCOPY;  Surgeon: Rogene Houston, MD;  Location: [facility] SUITE;  Service: Endoscopy;  Laterality: N/A;  215  . INTRAMEDULLARY (IM) NAIL INTERTROCHANTERIC Right 11/29/2018   Procedure: INTRAMEDULLARY (IM) NAIL INTERTROCHANTRIC FRACTURE;  Surgeon: Renette Butters, MD;  Location: [facility];  Service: Orthopedics;  Laterality: Right;  . TONSILLECTOMY    . YAG LASER APPLICATION Left [id number]   Procedure: YAG LASER APPLICATION;  Surgeon: Elta Guadeloupe T. Gershon Crane, MD;  Location: [facility];  Service: Ophthalmology;  Laterality: Left;  left     Home Medications:  Prior to Admission medications   Medication Sig Start Date End Date Taking? Authorizing Provider  acetaminophen (TYLENOL) 325 MG tablet Take 650 mg by mouth every 6 (six) hours as needed. 12/05/18  Yes [provider]  alendronate (FOSAMAX) 70 MG tablet Take 1 tablet (70 mg total) by mouth once a week. Take with a full glass of water on an empty stomach. 12/23/18  Yes Gerlene Fee, NP  apixaban (ELIQUIS) 5 MG TABS tablet Take 1 tablet (5 mg total) by mouth 2 (two) times daily. 04/07/19  Yes [name], MD  calcium-vitamin D (OS-CAL 500 + D) 500-200 MG-UNIT tablet Take 1 tablet by mouth 2 (two) times daily. 12/05/18  Yes Oretha Milch D, MD  diltiazem (CARDIZEM CD) 360 MG 24 hr capsule Take 1 capsule (360 mg total) by mouth daily. 04/07/19  Yes [name], MD  magnesium oxide (MAG-OX) 400 MG tablet Take 1 tablet (400 mg total) by mouth 3 (three) times daily. 12/23/18  Yes Gerlene Fee, NP  memantine (NAMENDA) 5 MG tablet Take 1 tablet (5 mg total) by mouth 2 (two) times daily. 12/23/18  Yes Gerlene Fee, NP  metoprolol tartrate (LOPRESSOR) 25 MG tablet Take 1 tablet (25 mg total) by mouth 2 (two) times daily. 04/08/19  Yes [name], MD  omeprazole (PRILOSEC) 20 MG capsule Take  20 mg by mouth daily. 08/27/19  Yes [provider]  Polyethyl Glycol-Propyl Glycol (SYSTANE OP) Place 1 drop into both eyes daily as needed. Dry Eyes    Yes [provider]  QUEtiapine (SEROQUEL) 25 MG tablet Take 1 tablet (25 mg total) by mouth at bedtime. 12/23/18  Yes Gerlene Fee, NP  sertraline (ZOLOFT) 50 MG tablet Take 1 tablet (50 mg total) by mouth every morning. 12/23/18  Yes Gerlene Fee, NP  torsemide (DEMADEX) 20 MG tablet Take 1/2   tablet by mouth daily.  If your weight goes up as much as 3 pounds take 2 daily 05/27/19  Yes Sytsma, [place] M, PA-C  pantoprazole (PROTONIX) 40 MG tablet Take 1 tablet (40 mg total) by mouth daily. Patient not taking: Reported on 09/10/2019 04/19/19   Roxan Hockey, MD    Inpatient Medications: Scheduled Meds: . apixaban  5 mg Oral BID  . Chlorhexidine Gluconate Cloth  6 each Topical Daily  . diltiazem  90 mg  Oral Q8H  . furosemide  20 mg Intravenous Q12H  . magnesium oxide  400 mg Oral TID  . memantine  5 mg Oral BID  . metoprolol tartrate  25 mg Oral BID  . pantoprazole  40 mg Oral Daily  . QUEtiapine  25 mg Oral QHS  . sodium chloride flush  3 mL Intravenous Q12H   Continuous Infusions: . sodium chloride     PRN Meds: sodium chloride, acetaminophen, dextromethorphan, polyvinyl alcohol, sodium chloride flush  Allergies:    Allergies  Allergen Reactions  . Penicillins Anaphylaxis    Did it involve swelling of the face/tongue/throat, SOB, or low BP? Unknown Did it involve sudden or severe rash/hives, skin peeling, or any reaction on the inside of your mouth or nose? Unknown Did you need to seek medical attention at a hospital or doctor's office? Unknown When did it last happen? If all above answers are "NO", may proceed with cephalosporin use.   . Biaxin [Clarithromycin]   . Ciprofloxacin Nausea And Vomiting  . Emetrol Nausea Only    swelling  . Feldene [Piroxicam] Nausea And Vomiting  . Sulfa  Antibiotics     swelling    Social History:   Social History   Socioeconomic History  . Marital status: Widowed    Spouse name: Not on file  . Number of children: Not on file  . Years of education: Not on file  . Highest education level: Not on file  Occupational History  . Occupation: retired  Tobacco Use  . Smoking status: Never Smoker  . Smokeless tobacco: Never Used  Substance and Sexual Activity  . Alcohol use: No  . Drug use: No  . Sexual activity: Yes    Birth control/protection: Surgical  Other Topics Concern  . Not on file  Social History Narrative   Pt lives in an apartment complex, reports that she still drives   Social Determinants of Health   Financial Resource Strain: Low Risk   . Difficulty of Paying Living Expenses: Not hard at all  Food Insecurity: No Food Insecurity  . Worried About Charity fundraiser in the Last Year: Never true  . Ran Out of Food in the Last Year: Never true  Transportation Needs: No Transportation Needs  . Lack of Transportation (Medical): No  . Lack of Transportation (Non-Medical): No  Physical Activity: Sufficiently Active  . Days of Exercise per Week: 5 days  . Minutes of Exercise per Session: 30 min  Stress: No Stress Concern Present  . Feeling of Stress : Not at all  Social Connections: Slightly Isolated  . Frequency of Communication with Friends and Family: More than three times a week  . Frequency of Social Gatherings with Friends and Family: More than three times a week  . Attends Religious Services: More than 4 times per year  . Active Member of Clubs or Organizations: Yes  . Attends Archivist Meetings: More than 4 times per year  . Marital Status: Widowed  Intimate Partner Violence: Not At Risk  . Fear of Current or Ex-Partner: No  . Emotionally Abused: No  . Physically Abused: No  . Sexually Abused: No     Family History:    Family History  Problem Relation Age of Onset  . Hypertension Child         Review of Systems    General:  No chills, fever, night sweats or weight changes.  Cardiovascular:  No chest pain, edema, palpitations, paroxysmal nocturnal dyspnea.  Positive for dyspnea on exertion and orthopnea.  Dermatological: No rash, lesions/masses Respiratory: No cough, dyspnea Urologic: No hematuria, dysuria Abdominal:   No nausea, vomiting, diarrhea, bright red blood per rectum, melena, or hematemesis Neurologic:  No visual changes, wkns, changes in mental status. All other systems reviewed and are otherwise negative except as noted above.  Physical Exam/Data    Vitals:   09/11/19 0700 09/11/19 0740 09/11/19 0800 09/11/19 0858  BP: 101/62  (!) 113/51   Pulse: 82 82 66 68  Resp: (!) 25 (!) 26 20 (!) 23  Temp:  (!) 97.4 F (36.3 C)    TempSrc:  Oral    SpO2: 94% 100% 98% 99%  Weight:      Height:        Intake/Output Summary (Last 24 hours) at 09/11/2019 0936 Last data filed at 09/11/2019 0500 Gross per 24 hour  Intake 720 ml  Output --  Net 720 ml   Filed Weights   09/10/19 1214 09/10/19 1858 09/11/19 0500  Weight: 60.8 kg 61.7 kg 62 kg   Body mass index is 23.46 kg/m.   General: Pleasant, female appearing in NAD Psych: Normal affect. Neuro: Alert and oriented X 3. Moves all extremities spontaneously. HEENT: Normal  Neck: Supple without bruits or JVD. Lungs:  Resp regular and unlabored, rales along bases bilaterally. Heart: Irregularly irregular. no s3, s4, 2/6 SEM along RUSB. Abdomen: Soft, non-tender, non-distended, BS + x 4.  Extremities: No clubbing, cyanosis or edema. DP/PT/Radials 2+ and equal bilaterally.   EKG:  The EKG was personally reviewed and demonstrates: Atrial fibrillation with RVR, HR 140 with LAD.   Telemetry:  Telemetry was personally reviewed and demonstrates: As above.    Labs/Studies     Relevant CV Studies:  Echocardiogram: 03/2019 IMPRESSIONS    1. Left ventricular ejection fraction, by visual estimation, is 70 to   75%. The left ventricle has hyperdynamic function. Decreased left  ventricular size. There is moderately increased left ventricular  hypertrophy.  2. Left ventricular diastolic Doppler parameters are indeterminate  pattern of LV diastolic filling.  3. Right ventricular volume and pressure overload.  4. Global right ventricle has normal systolic function.The right  ventricular size is moderately enlarged. Mildly increased right  ventricular wall thickness.  5. Left atrial size was severely dilated.  6. Right atrial size was normal.  7. Presence of pericardial fat pad.  8. Moderate aortic valve annular calcification.  9. Moderate to severe mitral annular calcification.  10. Severe calcification of the mitral valve leaflet(s). Valve appears  stenotic, not completely evaluated. Suggest follow-up images with VTI  measurements or planimetry if possible.  11. The mitral valve is degenerative. Mild mitral valve regurgitation.  12. The tricuspid valve is grossly normal. Tricuspid valve regurgitation  is mild.  13. Aortic valve area, by VTI measures 1.83 cm.  14. Aortic valve mean gradient measures 8.8 mmHg.  15. The aortic valve is tricuspid Aortic valve regurgitation was not  visualized by color flow Doppler. Mild aortic valve stenosis.  16. Moderately elevated pulmonary artery systolic pressure.  17. The tricuspid regurgitant velocity is 3.67 m/s, and with an assumed  right atrial pressure of 8 mmHg, the estimated right ventricular systolic  pressure is moderately elevated at 61.9 mmHg.  18. The inferior vena cava is normal in size with <50% respiratory  variability, suggesting right atrial pressure of 8 mmHg.  19. The pulmonic valve was grossly normal. Pulmonic valve regurgitation is  trivial by color flow Doppler.  Laboratory Data:  Chemistry Recent Labs  Lab 09/10/19 1253 09/11/19 0446  NA 140 142  K 4.0 4.0  CL 105 107  CO2 25 27  GLUCOSE 118* 104*  BUN 21 21   CREATININE 0.82 0.92  CALCIUM 9.7 9.1  GFRNONAA >60 57*  GFRAA >60 >60  ANIONGAP 10 8    Recent Labs  Lab 09/10/19 1253  PROT 7.5  ALBUMIN 3.9  AST 26  ALT 25  ALKPHOS 74  BILITOT 1.5*   Hematology Recent Labs  Lab 09/10/19 1253  WBC 9.2  RBC 4.10  HGB 10.5*  HCT 34.6*  MCV 84.4  MCH 25.6*  MCHC 30.3  RDW 16.7*  PLT 274   Cardiac EnzymesNo results for input(s): TROPONINI in the last 168 hours. No results for input(s): TROPIPOC in the last 168 hours.  BNP Recent Labs  Lab 09/10/19 1253  BNP 1,555.0*    DDimer No results for input(s): DDIMER in the last 168 hours.  Radiology/Studies:  DG Chest Port 1 View  Result Date: 09/10/2019 CLINICAL DATA:  Post RIGHT thoracentesis EXAM: PORTABLE CHEST 1 VIEW COMPARISON:  Portable exam 1555 hours compared to 09/10/2019 FINDINGS: Decrease in RIGHT pleural effusion and basilar atelectasis post thoracentesis. No pneumothorax. Enlargement of cardiac silhouette with pulmonary vascular congestion. Perihilar infiltrates question pulmonary edema. Persistent LEFT pleural effusion and basilar atelectasis. Mitral annular calcification noted. Atherosclerotic calcification aorta. Bones demineralized. IMPRESSION: No pneumothorax following RIGHT thoracentesis. Electronically Signed   By: Lavonia Dana M.D.   On: 09/10/2019 16:15   DG Chest Port 1 View  Result Date: 09/10/2019 CLINICAL DATA:  Shortness of breath EXAM: PORTABLE CHEST 1 VIEW COMPARISON:  05/02/2019 FINDINGS: Heart size is mildly enlarged, partially obscured. Calcific aortic knob. Moderate loculated appearing right-sided pleural effusion. Small left pleural effusion. Streaky bibasilar opacities. No pneumothorax. IMPRESSION: 1. Moderate loculated appearing right-sided pleural effusion. 2. Small left pleural effusion. 3. Bibasilar opacities may reflect atelectasis and/or pneumonia. Electronically Signed   By: Davina Poke D.O.   On: 09/10/2019 13:11   CT RENAL STONE STUDY  Result  Date: 09/10/2019 CLINICAL DATA:  Pain with urination, back pain EXAM: CT ABDOMEN AND PELVIS WITHOUT CONTRAST TECHNIQUE: Multidetector CT imaging of the abdomen and pelvis was performed following the standard protocol without IV contrast. COMPARISON:  04/13/2019 FINDINGS: Lower chest: Moderate right-sided pleural effusion with loculated component laterally. Small to moderate left-sided pleural effusion. Patchy bibasilar opacities. Thoracic aortic and coronary artery atherosclerotic calcifications. Mitral valve annular calcification. Hepatobiliary: No focal liver abnormality is seen. Status post cholecystectomy. No biliary dilatation. Pancreas: Unremarkable. No pancreatic ductal dilatation or surrounding inflammatory changes. Spleen: Normal in size without focal abnormality. Adrenals/Urinary Tract: Unremarkable adrenal glands. A single tiny punctate calculus within the superior pole of the right kidney as well as the inferior pole of left kidney. No hydronephrosis. Ureters are nondilated. No ureteral calculi identified. Urinary bladder appears unremarkable. Stomach/Bowel: Moderate to large hiatal hernia. No dilated loops of bowel. Intramural fatty deposition within the cecum and right colon as well as the terminal ileum likely reflecting sequela of chronic inflammatory bowel disease. Appendix not visualized and may be surgically absent. Extensive diverticulosis of the sigmoid and descending colon. No focal colonic thickening or pericolonic inflammatory changes. Vascular/Lymphatic: Aortic atherosclerosis. No enlarged abdominal or pelvic lymph nodes. Reproductive: Status post hysterectomy. No adnexal masses. Other: No abdominal wall hernia or abnormality. No abdominopelvic ascites. Musculoskeletal: Chronic superior endplate compression deformity of L4. Prior right hip ORIF. No acute osseous findings. IMPRESSION: 1. Nonobstructing  tiny bilateral renal calculi.  No hydronephrosis. 2. Moderate right-sided pleural effusion  with loculated component. Small to moderate left-sided pleural effusion. Patchy bibasilar opacities may represent atelectasis or infiltrate. 3. Moderate to large hiatal hernia. 4. Extensive diverticulosis of the sigmoid and descending colon without evidence of acute diverticulitis. 5. Intramural fatty deposition within the cecum and right colon as well as the terminal ileum likely reflecting sequela of chronic inflammatory bowel disease. No CT evidence of acute bowel inflammation. Aortic Atherosclerosis (ICD10-I70.0). Electronically Signed   By: Davina Poke D.O.   On: 09/10/2019 14:18   US THORACENTESIS ASP PLEURAL SPACE W/IMG GUIDE  Result Date: 09/10/2019 INDICATION: SOB Covid neg Rt Pleural effusion EXAM: ULTRASOUND GUIDED RIGHT THORACENTESIS MEDICATIONS: 10 cc 1% lidocaine. COMPLICATIONS: None immediate PROCEDURE: An ultrasound guided thoracentesis was thoroughly discussed with the patient and questions answered. The benefits, risks, alternatives and complications were also discussed. The patient understands and wishes to proceed with the procedure. Written consent was obtained. Ultrasound was performed to localize and mark an adequate pocket of fluid in the right chest. The area was then prepped and draped in the normal sterile fashion. 1% Lidocaine was used for local anesthesia. Under ultrasound guidance a 19 G Yueh catheter was introduced. Thoracentesis was performed. The catheter was removed and a dressing applied. FINDINGS: A total of approximately 1.2 liters of golden fluid was removed. Samples were sent to the laboratory as requested by the clinical team. IMPRESSION: Successful ultrasound guided right thoracentesis yielding 1.2 L of pleural fluid. Read by Lavonia Drafts [facility] Electronically Signed   By: Lavonia Dana M.D.   On: 09/10/2019 15:47     Assessment & Plan    1. Atrial Fibrillation with RVR - she has persistent atrial fibrillation and rates had been well-controlled at home per her  report but was elevated into the 140's upon admission. Received IV Cardizem at 5mg  and 10mg  but has been transitioned back to her PO regimen. Given improvement in rates with resumption of her PO regimen, I am concerned about compliance with her medications in the outpatient setting.  - she was on Cardizem CD 360mg  daily and Lopressor 25mg  BID prior to admission. Agree with short-acting Cardizem today given soft BP (SBP recorded as in the 60's when I entered the room, rechecked and in the 90's). Continue Lopressor at current dosing.  - continue Eliquis 5mg  BID for anticoagulation. Hgb 10.5 on admission which is close to her baseline.    2. Acute on Chronic Diastolic CHF Exacerbation - In talking with the patient, her symptoms developed rapidly over the past two days. Reports dyspnea on exertion and orthopnea but weight has remained stable. Was at 136 lbs at the time of her office visit in 07/2019, at 136 lbs on admission. - BNP 1555 and CXR showed a moderate loculated right-sided pleural effusion and she underwent thoracentesis with 1.2L of fluid removed. Of note, she did receive a 1L bolus while in the ED due to concern for an infectious process. Agree with IV Lasix today. Anticipate switching back to PO Torsemide 10mg  daily tomorrow. Follow renal function closely as she developed an AKI during prior admissions with diuresis.   3. Valvular Heart Disease - She has mild AS and mild MS by prior echocardiogram imaging. Will continue to follow as an outpatient.    For questions or updates, please contact [facility] Please consult [email or web address] for contact info under Cardiology/STEMI.  Signed, Erma Heritage, PA-C 09/11/2019, 9:36 AM Pager: [phone]  Patient seen and discussed with PA Ahmed Prima, I agree with her documentation above. 86 yo female history of permanent afib, chronic diastolic HF, mild AS admitted with SOB and tachycardia. In ER found to be in afib with RVR. Currently on dilt  90mg  short acting every 8 hrs, lopressor 25mg  bid. Some signs of fluid overload, low dose lasix 20mg  IV bid scheduled for today.    From further review of her afib was diagnosed 11/25/18 during admission. Rate controlled and discharged home. Multiple admits with afib with RVR, prior admit with bradycardia in setting of hyperkalemia and multiple metablic issues. She has been rate controlled for persistent afib, there has not been an attempt to restore SR b/c rate controlled has worked to some degree and some concern given her LAE that would not maintain SR.    Lactic acid 1.6 WBC 9.2 Hgb 10.5 Plt 274 K 4 Cr 0.82 BNP 1555 TSH 1.9 Mg 2.1  03/2019 echo LVE 70-75%, RV pressure and volume overload, mild AS, possible MS but incomplete evaluation,   Continue rate control for persistent afib. If low bp's and high HRs remain an issue would consider a trial of amiodarone as she has not had prior attempts. Would avoid digoxin to avoid triple av nodal agents in elderly woman with prior admit with bradycardia.   Pleural effusion s/p thoracentesis 1.2 L removed. Agree with gentle diuresis today as well.   Carlyle Dolly MD

## 2019-09-12 LAB — PH, BODY FLUID: pH, Body Fluid: 7.6

## 2019-09-12 LAB — BASIC METABOLIC PANEL
Anion gap: 10 (ref 5–15)
BUN: 25 mg/dL — ABNORMAL HIGH (ref 8–23)
CO2: 26 mmol/L (ref 22–32)
Calcium: 8.7 mg/dL — ABNORMAL LOW (ref 8.9–10.3)
Chloride: 102 mmol/L (ref 98–111)
Creatinine, Ser: 0.96 mg/dL (ref 0.44–1.00)
GFR calc Af Amer: >60 mL/min (ref >60)
GFR calc non Af Amer: 54 mL/min — ABNORMAL LOW (ref >60)
Glucose, Bld: 104 mg/dL — ABNORMAL HIGH (ref 70–99)
Potassium: 3.4 mmol/L — ABNORMAL LOW (ref 3.5–5.1)
Sodium: 138 mmol/L (ref 135–145)

## 2019-09-12 MED ORDER — AMIODARONE HCL IN DEXTROSE 360-4.14 MG/200ML-% IV SOLN
60.0000 mg/h | INTRAVENOUS | Status: AC
  Administered 2019-09-12 (×2): 60 mg/h via INTRAVENOUS
  Filled 2019-09-12 (×2): qty 200

## 2019-09-12 MED ORDER — AMIODARONE LOAD VIA INFUSION
150.0000 mg | Freq: Once | INTRAVENOUS | Status: AC
  Administered 2019-09-12: 150 mg via INTRAVENOUS
  Filled 2019-09-12: qty 83.34

## 2019-09-12 MED ORDER — DIPHENHYDRAMINE HCL 12.5 MG/5ML PO ELIX
6.2500 mg | ORAL_SOLUTION | Freq: Once | ORAL | Status: AC
  Administered 2019-09-12: 6.25 mg via ORAL
  Filled 2019-09-12: qty 5

## 2019-09-12 MED ORDER — POTASSIUM CHLORIDE CRYS ER 20 MEQ PO TBCR
40.0000 meq | EXTENDED_RELEASE_TABLET | Freq: Once | ORAL | Status: AC
  Administered 2019-09-12: 40 meq via ORAL
  Filled 2019-09-12: qty 2

## 2019-09-12 MED ORDER — AMIODARONE HCL IN DEXTROSE 360-4.14 MG/200ML-% IV SOLN
30.0000 mg/h | INTRAVENOUS | Status: DC
  Administered 2019-09-12 – 2019-09-13 (×3): 30 mg/h via INTRAVENOUS
  Filled 2019-09-12 (×2): qty 200

## 2019-09-12 NOTE — Progress Notes (Signed)
Physical Therapy Treatment Patient Details Name: Kristy Mckinney MRN: [id number] DOB: 11/03/1931 Today's Date: 09/12/2019    History of Present Illness Kristy Mckinney is a 86 y.o. female with past medical history significant for chronic diastolic heart failure, atrial fibrillation on chronic anticoagulation (Eliquis), essential hypertension, gastroesophageal flux disease, unspecified dementia with behavioral disturbances and vitamin D deficiency/osteoporosis; who presented to the hospital secondary to generalized weakness and increased shortness of breath.  Patient reports symptoms have been present for the last 3-4 days and worsening.  She reports no nausea, no vomiting, no chest pain, no abdominal pain, no fever, no rhinorrhea, no productive cough, no dysuria or significant changes in her weight.  Patient reports an ongoing pain in her back (which is chronic) and expressed having some difficulty sleeping and laying down flat for the last 2 days prior to admission. In the ED work-up demonstrated loculated right pleural effusion and vascular congestion versus infiltrates; A. fib with RVR and acute respiratory failure with hypoxia requiring oxygen supplementation.  Blood work was otherwise unremarkable.  BNP/troponin ordered and pending at time of evaluation.  Patient initially received IV fluids in the ED and was empirically started antibiotics with concern for potential community-acquired pneumonia.  TRH contacted to me patient for further evaluation and management of acute respiratory failure with hypoxia, loculated pleural effusion and A. fib with RVR.    PT Comments    Patient demonstrates good return for completing BLE ROM/strengthening exercises and completing single knee chest stretches while seated in chair, good tolerance increased endurance for ambulation without loss of balance pushing IV pole, on room air with SpO2 between 95-97% - RN notified.  Patient tolerated sitting up in chair after  therapy.  Patient will benefit from continued physical therapy in hospital and recommended venue below to increase strength, balance, endurance for safe ADLs and gait.   Follow Up Recommendations  Home health PT;Supervision - Intermittent     Equipment Recommendations  None recommended by PT    Recommendations for Other Services       Precautions / Restrictions Precautions Precautions: Fall Restrictions Weight Bearing Restrictions: No    Mobility  Bed Mobility               General bed mobility comments: Patient present seated in chair  Transfers Overall transfer level: Needs assistance Equipment used: None;1 person hand held assist Transfers: Sit to/from Stand;Stand Pivot Transfers Sit to Stand: Supervision Stand pivot transfers: Supervision       General transfer comment: slightly labored movement  Ambulation/Gait Ambulation/Gait assistance: Supervision Gait Distance (Feet): 85 Feet Assistive device: IV Pole Gait Pattern/deviations: Decreased step length - right;Decreased step length - left;Decreased stride length Gait velocity: decreased   General Gait Details: increased endurance/distance for ambulation with slightly labored cadence pushing IV pole without loss of balance, on room air with SpO2 at 95-97%   Stairs             Wheelchair Mobility    Modified Rankin (Stroke Patients Only)       Balance Overall balance assessment: Needs assistance Sitting-balance support: Feet supported;No upper extremity supported Sitting balance-Leahy Scale: Good Sitting balance - Comments: seated in chair   Standing balance support: During functional activity;Single extremity supported Standing balance-Leahy Scale: Fair Standing balance comment: fair/good pushing IV pole                            Cognition Arousal/Alertness: Awake/alert Behavior During Therapy:  WFL for tasks assessed/performed Overall Cognitive Status: Within Functional  Limits for tasks assessed                                        Exercises General Exercises - Lower Extremity Long Arc Quad: Seated;AROM;Strengthening;Both;10 reps Hip Flexion/Marching: Seated;AROM;Strengthening;Both;10 reps Toe Raises: Seated;AROM;Strengthening;Both;10 reps Heel Raises: Seated;AROM;Strengthening;Both;10 reps    General Comments        Pertinent Vitals/Pain Pain Assessment: 0-10 Pain Score: 7  Pain Location: 5/10 low back, 7/10 bilateral hips Pain Descriptors / Indicators: Sore;Discomfort;Aching Pain Intervention(s): Limited activity within patient's tolerance;Monitored during session;Repositioned    Home Living                      Prior Function            PT Goals (current goals can now be found in the care plan section) Acute Rehab PT Goals Patient Stated Goal: return home with family to assist PT Goal Formulation: With patient Time For Goal Achievement: 09/18/19 Potential to Achieve Goals: Good Progress towards PT goals: Progressing toward goals    Frequency    Min 3X/week      PT Plan      Co-evaluation              AM-PAC PT "6 Clicks" Mobility   Outcome Measure  Help needed turning from your back to your side while in a flat bed without using bedrails?: None Help needed moving from lying on your back to sitting on the side of a flat bed without using bedrails?: None Help needed moving to and from a bed to a chair (including a wheelchair)?: A Little Help needed standing up from a chair using your arms (e.g., wheelchair or bedside chair)?: None Help needed to walk in hospital room?: A Little Help needed climbing 3-5 steps with a railing? : A Little 6 Click Score: 21    End of Session   Activity Tolerance: Patient tolerated treatment well;Patient limited by fatigue Patient left: in chair;with call bell/phone within reach;with family/visitor present Nurse Communication: Mobility status PT Visit  Diagnosis: Unsteadiness on feet (R26.81);Other abnormalities of gait and mobility (R26.89);Muscle weakness (generalized) (M62.81)     Time: [id number] PT Time Calculation (min) (ACUTE ONLY): 24 min  Charges:  $Gait Training: 8-22 mins $Therapeutic Exercise: 8-22 mins                     3:53 PM, 09/12/19 Lonell Grandchild, MPT Physical Therapist with [facility] [phone] office [phone] mobile phone

## 2019-09-12 NOTE — Progress Notes (Signed)
Patient is very restless and trying to get out of bed without assistance

## 2019-09-12 NOTE — Care Management Important Message (Signed)
Important Message  Patient Details  Name: Kristy Mckinney MRN: [id number] Date of Birth: 31-Jul-1931   Medicare Important Message Given:  Yes     Tommy Medal 09/12/2019, 3:40 PM

## 2019-09-12 NOTE — Progress Notes (Signed)
PROGRESS NOTE    Kristy Mckinney  [id number] DOB: 18-Mar-1932 DOA: 09/10/2019 PCP: [name], MD    Brief Narrative:  86 y.o. female with past medical history significant for chronic diastolic heart failure, atrial fibrillation on chronic anticoagulation (Eliquis), essential hypertension, gastroesophageal flux disease, unspecified dementia with behavioral disturbances and vitamin D deficiency/osteoporosis; who presented to the hospital secondary to generalized weakness and increased shortness of breath.  Patient reports symptoms have been present for the last 3-4 days and worsening.  She reports no nausea, no vomiting, no chest pain, no abdominal pain, no fever, no rhinorrhea, no productive cough, no dysuria or significant changes in her weight.  Patient reports an ongoing pain in her back (which is chronic) and expressed having some difficulty sleeping and laying down flat for the last 2 days prior to admission.  In the ED work-up demonstrated loculated right pleural effusion and vascular congestion versus infiltrates; A. fib with RVR and acute respiratory failure with hypoxia requiring oxygen supplementation.  Blood work was otherwise unremarkable.  BNP/troponin ordered and pending at time of evaluation.  Patient initially received IV fluids in the ED and was empirically started antibiotics with concern for potential community-acquired pneumonia.  TRH contacted to me patient for further evaluation and management of acute respiratory failure with hypoxia, loculated pleural effusion and A. fib with RVR.    Assessment & Plan: 1-acute respiratory failure with hypoxia: In the setting of A. fib with RVR, flash pulmonary edema, vascular congestion and mild acute on chronic diastolic heart failure. -Improved but is still uncontrolled; continue Cardizem and current dose of metoprolol; per cardiology recommendations patient has been started on IV amiodarone.  Follow response. -Continue apixaban for  secondary prevention -Patient appears to be euvolemic currently there is a mild bump in her creatinine from diuresis.  Will follow renal function and electrolytes.  Stop Lasix for the rest of the day and resume home oral regimen in a.m. as recommended by cardiologist service. -Continue low-sodium diet, daily weight and strict I's and O's. -Pleural effusion tapped and 1.2 L removed; fluid analysis suggesting CHF as origin for fluid buildup.  No signs of infection. -continue holding antibiotics and follow clinical response -Oxygen supplementation completely titrated off and good O2 sat appreciated.  2-hypomagnesemia -Continue to follow electrolytes and continue daily supplementation.  3-cognitive impairment/dementia with behavioral disorder: Including anxiety and depression -Mild cognitive impairment (appears to be at baseline); stable and well-controlled: -Continue the use of home psychotropic medications (including: Seroquel, sertraline and Namenda). -Continue supportive care.  4-essential hypertension -Blood pressure soft but overall stable -Continue to watch closely to prevent hypotension.  5-generalized weakness -Patient has been seen by physical therapy who has recommended home health PT at time of discharge. -Still feeling generally weak, but is slightly stronger today.  6-gastroesophageal flux disease -Continue PPI.  DVT prophylaxis: Apixaban Code Status: Full code Family Communication: Daughter updated on patient's condition at time of admission; no family member at bedside during this visit. Disposition Plan: Remains in the stepdown unit; continue oral metoprolol and Cardizem; per cardiology recommendations will start IV amiodarone and follow response.  Closely monitor patient electrolytes.  Consultants:   Cardiology service  Procedures:   See below for x-ray reports.  Antimicrobials:  Anti-infectives (From admission, onward)   Start     Dose/Rate Route Frequency  Ordered Stop   09/10/19 1415  azithromycin (ZITHROMAX) 500 mg in sodium chloride 0.9 % 250 mL IVPB  Status:  Discontinued     500 mg  250 mL/hr over 60 Minutes Intravenous Every 24 hours 09/10/19 1336 09/10/19 1821   09/10/19 1345  cefTRIAXone (ROCEPHIN) 2 g in sodium chloride 0.9 % 100 mL IVPB     2 g 200 mL/hr over 30 Minutes Intravenous  Once 09/10/19 1336 09/10/19 1711      Subjective: Afebrile, no chest pain, no nausea, no vomiting.  Reports significant improvement in her breathing.  Still experiencing uncontrolled atrial fibrillation with easy fluctuation in her heart rate.  Patient on room air currently with good O2 sat.  Objective: Vitals:   09/12/19 1200 09/12/19 1427 09/12/19 1500 09/12/19 1600  BP:  (!) 109/58 113/76   Pulse:   94   Resp:      Temp: 97.8 F (36.6 C)   98.5 F (36.9 C)  TempSrc: Oral   Oral  SpO2:   92%   Weight:      Height:        Intake/Output Summary (Last 24 hours) at 09/12/2019 1733 Last data filed at 09/12/2019 1500 Gross per 24 hour  Intake 410.48 ml  Output --  Net 410.48 ml   Filed Weights   09/10/19 1858 09/11/19 0500 09/12/19 0500  Weight: 61.7 kg 62 kg 62.1 kg    Examination: General exam: Alert, awake, oriented x 3; feeling much better and reporting significant improvement in her breathing.  Currently with good oxygen saturation on room air; denying chest pain.  Still demonstrating intermittent episode of uncontrolled heart rate and atrial fibrillation. Respiratory system: Clear to auscultation bilaterally; no using accessory muscle.  Normal respiratory effort.  Good O2 sat on room air. Cardiovascular system: Irregular irregular; no rubs, no gallops, positive systolic murmur; no JVD on exam.  Gastrointestinal system: Abdomen is nondistended, soft and nontender. No organomegaly or masses felt. Normal bowel sounds heard. Central nervous system: Alert and oriented. No focal neurological deficits. Extremities: No cyanosis or  clubbing. Skin: No rashes, no petechiae. Psychiatry: Judgement and insight appear normal. Mood & affect appropriate.    Data Reviewed: I have personally reviewed following labs and imaging studies  CBC: Recent Labs  Lab 09/10/19 1253  WBC 9.2  HGB 10.5*  HCT 34.6*  MCV 84.4  PLT [id number]   Basic Metabolic Panel: Recent Labs  Lab 09/10/19 1253 09/11/19 0446 09/12/19 0501  NA 140 142 138  K 4.0 4.0 3.4*  CL 105 107 102  CO2 25 27 26   GLUCOSE 118* 104* 104*  BUN 21 21 25*  CREATININE 0.82 0.92 0.96  CALCIUM 9.7 9.1 8.7*  MG 2.1  --   --   PHOS 3.1  --   --    GFR: Estimated Creatinine Clearance: 37 mL/min (by C-G formula based on SCr of 0.96 mg/dL).   Liver Function Tests: Recent Labs  Lab 09/10/19 1253  AST 26  ALT 25  ALKPHOS 74  BILITOT 1.5*  PROT 7.5  ALBUMIN 3.9   Thyroid Function Tests: Recent Labs    09/10/19 1253  TSH 1.877   Anemia Panel: Recent Labs    09/10/19 2047  VITAMINB12 350   Urine analysis:    Component Value Date/Time   COLORURINE YELLOW 09/10/2019 1248   APPEARANCEUR CLEAR 09/10/2019 1248   LABSPEC 1.018 09/10/2019 1248   PHURINE 6.0 09/10/2019 1248   GLUCOSEU NEGATIVE 09/10/2019 1248   HGBUR NEGATIVE 09/10/2019 1248   BILIRUBINUR NEGATIVE 09/10/2019 1248   KETONESUR NEGATIVE 09/10/2019 1248   PROTEINUR 100 (A) 09/10/2019 1248   NITRITE NEGATIVE 09/10/2019 1248  LEUKOCYTESUR NEGATIVE 09/10/2019 1248    Recent Results (from the past 240 hour(s))  Culture, blood (single) w Reflex to ID Panel     Status: None (Preliminary result)   Collection Time: 09/10/19 12:53 PM   Specimen: BLOOD RIGHT FOREARM  Result Value Ref Range Status   Specimen Description BLOOD RIGHT FOREARM  Final   Special Requests   Final    BOTTLES DRAWN AEROBIC AND ANAEROBIC Blood Culture adequate volume   Culture   Final    NO GROWTH 2 DAYS Performed at [facility], [address] [place], [place] [address]    Report Status PENDING  Incomplete  Gram  stain     Status: None   Collection Time: 09/10/19  3:06 PM   Specimen: PATH Cytology Pleural fluid  Result Value Ref Range Status   Specimen Description PLEURAL  Final   Special Requests BOTTLES DRAWN AEROBIC AND ANAEROBIC  Final   Gram Stain   Final    NO ORGANISMS SEEN WBC PRESENT,BOTH PMN AND MONONUCLEAR CYTOSPIN SMEAR Performed at [facility], [address] [place], [place] [address]    Report Status 09/10/2019 FINAL  Final  Culture, body fluid-bottle     Status: None (Preliminary result)   Collection Time: 09/10/19  3:06 PM   Specimen: Pleura  Result Value Ref Range Status   Specimen Description PLEURAL  Final   Special Requests   Final    BOTTLES DRAWN AEROBIC AND ANAEROBIC Blood Culture adequate volume   Culture   Final    NO GROWTH 2 DAYS Performed at [facility], [address], [place], [place] [address]    Report Status PENDING  Incomplete  MRSA PCR Screening     Status: None   Collection Time: 09/10/19  6:58 PM   Specimen: Nasopharyngeal  Result Value Ref Range Status   MRSA by PCR NEGATIVE NEGATIVE Final    Comment:        The GeneXpert MRSA Assay (FDA approved for NASAL specimens only), is one component of a comprehensive MRSA colonization surveillance program. It is not intended to diagnose MRSA infection nor to guide or monitor treatment for MRSA infections. Performed at [facility], [address], [place], [place] [address]      Radiology Studies: No results found.  Scheduled Meds: . apixaban  5 mg Oral BID  . Chlorhexidine Gluconate Cloth  6 each Topical Daily  . diltiazem  90 mg Oral Q8H  . magnesium oxide  400 mg Oral TID  . memantine  5 mg Oral BID  . metoprolol tartrate  25 mg Oral BID  . pantoprazole  40 mg Oral Daily  . QUEtiapine  25 mg Oral QHS  . sodium chloride flush  3 mL Intravenous Q12H   Continuous Infusions: . sodium chloride    . amiodarone 30 mg/hr (09/12/19 1605)     LOS: 1 day    Time spent: 35  minutes.    Barton Dubois, MD [facility] Pager [phone]   09/12/2019, 5:33 PM

## 2019-09-12 NOTE — Progress Notes (Signed)
Progress Note  Patient Name: Kristy Mckinney Date of Encounter: 09/12/2019  Primary Cardiologist: Rozann Lesches, MD   Subjective   SOB improved   Inpatient Medications    Scheduled Meds: . apixaban  5 mg Oral BID  . Chlorhexidine Gluconate Cloth  6 each Topical Daily  . diltiazem  90 mg Oral Q8H  . furosemide  20 mg Intravenous Q12H  . magnesium oxide  400 mg Oral TID  . memantine  5 mg Oral BID  . metoprolol tartrate  25 mg Oral BID  . pantoprazole  40 mg Oral Daily  . QUEtiapine  25 mg Oral QHS  . sodium chloride flush  3 mL Intravenous Q12H   Continuous Infusions: . sodium chloride     PRN Meds: sodium chloride, acetaminophen, dextromethorphan, polyvinyl alcohol, sodium chloride flush   Vital Signs    Vitals:   09/12/19 0500 09/12/19 0700 09/12/19 0800 09/12/19 0835  BP: 95/67 (!) 104/53 92/79 121/65  Pulse: 99 (!) 59 93 (!) 111  Resp: (!) 26 (!) 21 (!) 21   Temp:   98 F (36.7 C)   TempSrc:   Oral   SpO2: 92% 92% 93%   Weight: 62.1 kg     Height:        Intake/Output Summary (Last 24 hours) at 09/12/2019 0926 Last data filed at 09/11/2019 1800 Gross per 24 hour  Intake 480 ml  Output 1350 ml  Net -870 ml   Last 3 Weights 09/12/2019 09/11/2019 09/10/2019  Weight (lbs) 136 lb 14.5 oz 136 lb 11 oz 136 lb 0.4 oz  Weight (kg) 62.1 kg 62 kg 61.7 kg      Telemetry    Afib, variable rates - Personally Reviewed  ECG    n/a - Personally Reviewed  Physical Exam   GEN: No acute distress.   Neck: mildly elevated JVD Cardiac: irreg Respiratory: Clear to auscultation bilaterally. GI: Soft, nontender, non-distended  MS: No edema; No deformity. Neuro:  Nonfocal  Psych: Normal affect   Labs    High Sensitivity Troponin:  No results for input(s): TROPONINIHS in the last 720 hours.    Chemistry Recent Labs  Lab 09/10/19 1253 09/11/19 0446 09/12/19 0501  NA 140 142 138  K 4.0 4.0 3.4*  CL 105 107 102  CO2 25 27 26   GLUCOSE 118* 104* 104*  BUN 21  21 25*  CREATININE 0.82 0.92 0.96  CALCIUM 9.7 9.1 8.7*  PROT 7.5  --   --   ALBUMIN 3.9  --   --   AST 26  --   --   ALT 25  --   --   ALKPHOS 74  --   --   BILITOT 1.5*  --   --   GFRNONAA >60 57* 54*  GFRAA >60 >60 >60  ANIONGAP 10 8 10      Hematology Recent Labs  Lab 09/10/19 1253  WBC 9.2  RBC 4.10  HGB 10.5*  HCT 34.6*  MCV 84.4  MCH 25.6*  MCHC 30.3  RDW 16.7*  PLT 274    BNP Recent Labs  Lab 09/10/19 1253  BNP 1,555.0*     DDimer No results for input(s): DDIMER in the last 168 hours.   Radiology    DG Chest Port 1 View  Result Date: 09/10/2019 CLINICAL DATA:  Post RIGHT thoracentesis EXAM: PORTABLE CHEST 1 VIEW COMPARISON:  Portable exam 1555 hours compared to 09/10/2019 FINDINGS: Decrease in RIGHT pleural effusion and basilar atelectasis  post thoracentesis. No pneumothorax. Enlargement of cardiac silhouette with pulmonary vascular congestion. Perihilar infiltrates question pulmonary edema. Persistent LEFT pleural effusion and basilar atelectasis. Mitral annular calcification noted. Atherosclerotic calcification aorta. Bones demineralized. IMPRESSION: No pneumothorax following RIGHT thoracentesis. Electronically Signed   By: Lavonia Dana M.D.   On: 09/10/2019 16:15   DG Chest Port 1 View  Result Date: 09/10/2019 CLINICAL DATA:  Shortness of breath EXAM: PORTABLE CHEST 1 VIEW COMPARISON:  05/02/2019 FINDINGS: Heart size is mildly enlarged, partially obscured. Calcific aortic knob. Moderate loculated appearing right-sided pleural effusion. Small left pleural effusion. Streaky bibasilar opacities. No pneumothorax. IMPRESSION: 1. Moderate loculated appearing right-sided pleural effusion. 2. Small left pleural effusion. 3. Bibasilar opacities may reflect atelectasis and/or pneumonia. Electronically Signed   By: Davina Poke D.O.   On: 09/10/2019 13:11   CT RENAL STONE STUDY  Result Date: 09/10/2019 CLINICAL DATA:  Pain with urination, back pain EXAM: CT ABDOMEN  AND PELVIS WITHOUT CONTRAST TECHNIQUE: Multidetector CT imaging of the abdomen and pelvis was performed following the standard protocol without IV contrast. COMPARISON:  04/13/2019 FINDINGS: Lower chest: Moderate right-sided pleural effusion with loculated component laterally. Small to moderate left-sided pleural effusion. Patchy bibasilar opacities. Thoracic aortic and coronary artery atherosclerotic calcifications. Mitral valve annular calcification. Hepatobiliary: No focal liver abnormality is seen. Status post cholecystectomy. No biliary dilatation. Pancreas: Unremarkable. No pancreatic ductal dilatation or surrounding inflammatory changes. Spleen: Normal in size without focal abnormality. Adrenals/Urinary Tract: Unremarkable adrenal glands. A single tiny punctate calculus within the superior pole of the right kidney as well as the inferior pole of left kidney. No hydronephrosis. Ureters are nondilated. No ureteral calculi identified. Urinary bladder appears unremarkable. Stomach/Bowel: Moderate to large hiatal hernia. No dilated loops of bowel. Intramural fatty deposition within the cecum and right colon as well as the terminal ileum likely reflecting sequela of chronic inflammatory bowel disease. Appendix not visualized and may be surgically absent. Extensive diverticulosis of the sigmoid and descending colon. No focal colonic thickening or pericolonic inflammatory changes. Vascular/Lymphatic: Aortic atherosclerosis. No enlarged abdominal or pelvic lymph nodes. Reproductive: Status post hysterectomy. No adnexal masses. Other: No abdominal wall hernia or abnormality. No abdominopelvic ascites. Musculoskeletal: Chronic superior endplate compression deformity of L4. Prior right hip ORIF. No acute osseous findings. IMPRESSION: 1. Nonobstructing tiny bilateral renal calculi.  No hydronephrosis. 2. Moderate right-sided pleural effusion with loculated component. Small to moderate left-sided pleural effusion. Patchy  bibasilar opacities may represent atelectasis or infiltrate. 3. Moderate to large hiatal hernia. 4. Extensive diverticulosis of the sigmoid and descending colon without evidence of acute diverticulitis. 5. Intramural fatty deposition within the cecum and right colon as well as the terminal ileum likely reflecting sequela of chronic inflammatory bowel disease. No CT evidence of acute bowel inflammation. Aortic Atherosclerosis (ICD10-I70.0). Electronically Signed   By: Davina Poke D.O.   On: 09/10/2019 14:18   US THORACENTESIS ASP PLEURAL SPACE W/IMG GUIDE  Result Date: 09/10/2019 INDICATION: SOB Covid neg Rt Pleural effusion EXAM: ULTRASOUND GUIDED RIGHT THORACENTESIS MEDICATIONS: 10 cc 1% lidocaine. COMPLICATIONS: None immediate PROCEDURE: An ultrasound guided thoracentesis was thoroughly discussed with the patient and questions answered. The benefits, risks, alternatives and complications were also discussed. The patient understands and wishes to proceed with the procedure. Written consent was obtained. Ultrasound was performed to localize and mark an adequate pocket of fluid in the right chest. The area was then prepped and draped in the normal sterile fashion. 1% Lidocaine was used for local anesthesia. Under ultrasound guidance a 19 G  Yueh catheter was introduced. Thoracentesis was performed. The catheter was removed and a dressing applied. FINDINGS: A total of approximately 1.2 liters of golden fluid was removed. Samples were sent to the laboratory as requested by the clinical team. IMPRESSION: Successful ultrasound guided right thoracentesis yielding 1.2 L of pleural fluid. Read by Lavonia Drafts [facility] Electronically Signed   By: Lavonia Dana M.D.   On: 09/10/2019 15:47    Cardiac Studies    Patient Profile     Kristy Mckinney is a 86 y.o. female with past medical history of permanent atrial fibrillation, chronic diastolic CHF, mild AS, mild MS and chronic back pain who is being seen today for  the evaluation of atrial fibrillation with RVR at the request of Dr. Dyann Kief.   Assessment & Plan    1. Persistent afib - has been under rate control at home. No prior attempts to restore SR based on prior notes due to prior response to rate control and also LAE (LAVI 53) and thought she may not maintain SR - currently on dilt 90mg  every 8 hrs, metoprolol 25mg  bid. Soft bp's at times.  - she is on eliquis for stroke prevention  - afib diagnosed 11/2018. Third admisson since that time with afib with RVR. Admit 04/2019 with bradycardia in setting of hyperkalemia and AKI. REcurrent issues with diastolic HF may be related to her afib.  - I think over the last few months rate contorl has failed in managing her afib and also uncontrolled afib has led to recurrent exacerbations of her diastolic HF with admissions. WIll try IV amiodarone today to see if alternate strategy can break this recurring cycle.    2. Acute on chronic diastolic HF - BNP 99991111 and CXR showed a moderate loculated right-sided pleural effusion and she underwent thoracentesis with 1.2L of fluid removed - I/Os are incomplete. WEight 136 and stable on admit. Mild uptrend in Cr with diuresis  - symptoms improved. Mildly elevated JVD, d/c IV lasix after AM dose, would restart her home regimen tomorrow.     For questions or updates, please contact [place] Please consult [email or web address] for contact info under        Signed, Carlyle Dolly, MD  09/12/2019, 9:26 AM

## 2019-09-12 NOTE — TOC Transition Note (Signed)
Transition of Care [facility]) - CM/SW Discharge Note   Patient Details  Name: Kristy Mckinney MRN: [id number] Date of Birth: 1932-01-12  Transition of Care [facility]) CM/SW Contact:  Shade Flood, LCSW Phone Number: 09/12/2019, 11:46 AM   Clinical Narrative:     Pt stable for dc today. PT recommending [facility]. Met with pt to discuss Transition of Care needs. Pt agreeable to HHPT. She states she has used [facility] in the past and would like them again. Pt reports that she has all DME she needs and that she has supportive daughters who assist as needed.  Referral given to [facility] at [place]. There are no other TOC needs for dc.  Expected Discharge Plan: [place] Barriers to Discharge: Barriers Resolved   Patient Goals and CMS Choice Patient states their goals for this hospitalization and ongoing recovery are:: return home CMS Medicare.gov Compare Post Acute Care list provided to:: Patient Choice offered to / list presented to : Patient  Expected Discharge Plan and Services Expected Discharge Plan: [place] In-house Referral: Clinical Social Work   Post Acute Care Choice: [place] arrangements for the past 2 months: [place]: PT [place]: [place] ([place]) Date [place]: 09/12/19   Representative spoke with at [place]: Vaughan Basta  Prior Living Arrangements/Services Living arrangements for the past 2 months: [place] Lives with:: Self Patient language and need for interpreter reviewed:: Yes Do you feel safe going back to the place where you live?: Yes      Need for Family Participation in Patient Care: No (Comment) Care giver support system in place?: Yes (comment) Current home services: DME Criminal Activity/Legal Involvement Pertinent to Current Situation/Hospitalization: No - Comment as needed  Activities of Daily Living Home Assistive  Devices/Equipment: [place] Rile (specify type) ADL Screening (condition at time of admission) Patient's cognitive ability adequate to safely complete daily activities?: Yes Is the patient deaf or have difficulty hearing?: No Does the patient have difficulty seeing, even when wearing glasses/contacts?: No Does the patient have difficulty concentrating, remembering, or making decisions?: No Patient able to express need for assistance with ADLs?: Yes Does the patient have difficulty dressing or bathing?: No Independently performs ADLs?: Yes (appropriate for developmental age) Does the patient have difficulty walking or climbing stairs?: No Weakness of Legs: Both Weakness of Arms/Hands: None  Permission Sought/Granted Permission sought to share information with : Chartered certified accountant granted to share information with : Yes, Verbal Permission Granted     Permission granted to share info w AGENCY: [facility]        Emotional Assessment Appearance:: Appears younger than stated age Attitude/Demeanor/Rapport: Engaged Affect (typically observed): Pleasant Orientation: : Oriented to Self, Oriented to Place, Oriented to  Time, Oriented to Situation Alcohol / Substance Use: Not Applicable Psych Involvement: No (comment)  Admission diagnosis:  SOB (shortness of breath) [R06.02] Pleural effusion [J90] Acute respiratory failure with hypoxia (HCC) [J96.01] Atrial fibrillation with RVR ([place]) [I48.91] Patient Active Problem List   Diagnosis Date Noted  . Elevated glucose 08/20/2019  . Degenerative lumbar disc 08/20/2019  . Left hip pain 08/20/2019  . Osteoporosis   . Anemia, unspecified   . Anxiety disorder, unspecified   . Vitamin D deficiency, unspecified   . Essential (primary) hypertension   .  Abdominal distension (gaseous)   . Disorder of the skin and subcutaneous tissue, unspecified   . Gastro-esophageal reflux disease with esophagitis   . Iron deficiency anemia, unspecified    . Irritable bowel syndrome with diarrhea   . Left temporomandibular joint disorder, unspecified   . Unspecified dementia without behavioral disturbance ([place])   . Unspecified fracture of unspecified wrist and hand, initial encounter for closed fracture   . Advanced care planning/counseling discussion   . Acute on chronic diastolic congestive heart failure ([place]) 05/02/2019  . Acute on chronic diastolic CHF (congestive heart failure) ([place]) 05/02/2019  . Palliative care by specialist   . Goals of care, counseling/discussion   . Generalized weakness   . Pleural effusion 04/13/2019  . Pancreatitis, acute 04/13/2019  . Acute respiratory failure with hypoxia ([place]) 04/13/2019  . Bradycardia 04/10/2019  . Hypotension 04/10/2019  . Hyperkalemia 04/10/2019  . ARF (acute renal failure) ([place]) 04/10/2019  . Atrial fibrillation ([place]) 04/06/2019  . A-fib ([place]) 04/04/2019  . Dementia ([place]) 12/12/2018  . GERD without esophagitis 12/06/2018  . Post-menopausal osteoporosis 12/06/2018  . Acute delirium 12/06/2018  . Hypomagnesemia 12/06/2018  . Acute blood loss anemia 12/06/2018  . Closed fracture of right femur, unspecified fracture morphology, initial encounter ([place]) 11/28/2018  . Acute diastolic CHF (congestive heart failure) ([place]) 11/28/2018  . Fracture, intertrochanteric, right femur ([place]) 11/28/2018  . Atrial fibrillation with rapid ventricular response ([place]) 11/25/2018  . Atrial fibrillation with RVR ([place]) 11/24/2018  . Chronic back pain 11/24/2018  . Anxiety and depression 11/24/2018  . Chronic constipation 05/03/2016  . Dysphagia 05/03/2016  . Cholecystitis with cholelithiasis 01/29/2014   PCP:  [name], MD Pharmacy:   [facility] [place] [address] [place] [address] Phone: [phone] Fax: [phone]  [facility] - [address] [place], [place] [address] Phone:  [phone] Fax: [phone]     Social Determinants of Health (SDOH) Interventions    Readmission Risk Interventions Readmission Risk Prevention Plan 09/12/2019 05/10/2019 05/05/2019  Post Dischage Appt - - -  Appt Comments - - -  Medication Screening - - -  Transportation Screening Complete Complete -  Medication Review (RN Care Manager) Complete Complete -  PCP or Specialist appointment within 3-5 days of discharge - Complete -  [place] or Home Care Consult Complete Complete Complete  SW Recovery Care/Counseling Consult Complete Complete Complete  Palliative Care Screening Not Applicable Complete Complete  [place] Not Applicable Not Applicable Not Applicable  Some recent data might be hidden    Final next level of care: Home w Home Health Services Barriers to Discharge: Barriers Resolved   Patient Goals and CMS Choice Patient states their goals for this hospitalization and ongoing recovery are:: return home CMS Medicare.gov Compare Post Acute Care list provided to:: Patient Choice offered to / list presented to : Patient  Discharge Placement                       Discharge Plan and Services In-house Referral: Clinical Social Work   Post Acute Care Choice: [place]: PT [facility] Agency: [place] ([facility]) Date [place]: 09/12/19   Representative spoke with at [place]: [place] ([place]) Interventions     Readmission  Risk Interventions Readmission Risk Prevention Plan 09/12/2019 05/10/2019 05/05/2019  Post Dischage Appt - - -  Appt Comments - - -  Medication Screening - - -  Transportation Screening Complete Complete -  Medication Review Press photographer) Complete Complete -  PCP or Specialist appointment within 3-5 days of discharge - Complete -  [place] or [place] Complete Complete Complete  SW Recovery Care/Counseling Consult Complete Complete  Complete  Palliative Care Screening Not Applicable Complete Complete  [place] Not Applicable Not Applicable Not Applicable  Some recent data might be hidden

## 2019-09-13 MED ORDER — TORSEMIDE 20 MG PO TABS
10.0000 mg | ORAL_TABLET | Freq: Every day | ORAL | Status: DC
  Administered 2019-09-13: 10 mg via ORAL
  Filled 2019-09-13: qty 1

## 2019-09-13 MED ORDER — AMIODARONE HCL 200 MG PO TABS: 200.0000 mg | ORAL_TABLET | Freq: Two times a day (BID) | ORAL | 1 refills | Status: DC

## 2019-09-13 MED ORDER — BENZONATATE 100 MG PO CAPS: 200.0000 mg | ORAL_CAPSULE | Freq: Three times a day (TID) | ORAL | 0 refills | Status: DC | PRN

## 2019-09-13 MED ORDER — DILTIAZEM HCL ER COATED BEADS 180 MG PO CP24: 180.0000 mg | ORAL_CAPSULE | Freq: Every day | ORAL | 2 refills | Status: DC

## 2019-09-13 NOTE — Discharge Summary (Signed)
Physician Discharge Summary  Kristy Mckinney [id number] DOB: 1931/07/20 DOA: 09/10/2019  PCP: [name], MD  Admit date: 09/10/2019 Discharge date: 09/13/2019  Time spent: 35 minutes  Recommendations for Outpatient Follow-up:  1. Repeat basic metabolic panel to evaluate lites and renal function 2. Close monitoring of patient's blood pressure with adjustment of anti-hypertensive regimen as needed. 3. Make sure patient follow-up with cardiology service as instructed and take her medications as prescribed.   Discharge Diagnoses:  Principal Problem:   Atrial fibrillation with RVR (HCC) Active Problems:   Anxiety and depression   GERD without esophagitis   Hypomagnesemia   Dementia (HCC)   Acute respiratory failure with hypoxia (HCC)   Generalized weakness   Essential (primary) hypertension   Discharge Condition: Stable and improved. Patient discharged home with instruction to follow-up with cardiology service and with PCP as an outpatient.  CODE STATUS: Full code.  Diet recommendation: Heart healthy/low-sodium diet.  Filed Weights   09/11/19 0500 09/12/19 0500 09/13/19 0403  Weight: 62 kg 62.1 kg 63.3 kg    History of present illness:  86 y.o.femalewith past medical history significant for chronic diastolic heart failure, atrial fibrillation on chronic anticoagulation (Eliquis), essential hypertension, gastroesophageal flux disease, unspecified dementia with behavioral disturbancesandvitamin D deficiency/osteoporosis; who presented to the hospital secondary to generalized weakness and increased shortness of breath. Patient reports symptoms have been present for the last 3-4 days and worsening. She reports no nausea, no vomiting, no chest pain, no abdominal pain, no fever, no rhinorrhea, no productive cough, no dysuria or significant changes in her weight. Patient reports an ongoing pain in her back (which is chronic) and expressed having some difficulty sleeping and  laying down flat for the last 2 days prior to admission.  In the ED work-up demonstrated loculated right pleural effusion and vascular congestion versus infiltrates; A. fib with RVR and acute respiratory failure with hypoxia requiring oxygen supplementation. Blood work was otherwise unremarkable. BNP/troponinordered and pending at time of evaluation. Patient initially received IV fluids in the ED and was empirically started antibiotics with concern for potential community-acquired pneumonia. TRH contacted to me patient for further evaluation and management of acute respiratory failure with hypoxia, loculated pleural effusion and A. fib with RVR.   Hospital Course:  1-acute respiratory failure with hypoxia: In the setting of A. fib with RVR (chronic and typical), flash pulmonary edema, vascular congestion and mild acute on chronic diastolic heart failure. -rate improved/controlled, but still on A. Fib. Great response to amiodarone; per cardiology rec's will discharge on 190mg  extended release, metoprolol 25 mg BID and the use of amiodarone 200 mg twice a day for 4 weeks and subsequently 200 mg daily. Cardiology service will follow her up closely as an outpatient.  -Continue apixaban for secondary prevention -Patient appears to be euvolemic currently, will resume daily use of Demadex as prior to admission prescribed. -Continue low-sodium diet, daily weight and strict I's and O's. -Pleural effusion tapped and 1.2 L removed; fluid analysis suggesting CHF as origin for fluid buildup.  No signs of infection. -continue holding antibiotics and follow clinical response -Oxygen supplementation completely titrated off and good O2 sat appreciated.  2-hypomagnesemia -Continue to follow electrolytes and continue daily supplementation.  3-cognitive impairment/dementia with behavioral disorder: Including anxiety and depression -Mild cognitive impairment (appears to be at baseline); stable and  well-controlled: -Continue the use of home psychotropic medications (including: Seroquel, sertraline and Namenda). -Continue supportive care.  4-essential hypertension -Blood pressure soft but overall stable -Continue to watch  closely to prevent hypotension. -adjusted dose of cardizem at discharge will help with this.   5-generalized weakness -Patient has been seen by physical therapy who has recommended home health PT at time of discharge. -Still feeling generally weak, but is slightly stronger and feeling better today. She wants to go home.  6-gastroesophageal flux disease -Continue PPI.   Procedures:  See below for x-ray reports.   Consultations:  Cardiology service.  (Dr. Harl Bowie).  Discharge Exam: Vitals:   09/13/19 1030 09/13/19 1130  BP: (!) 112/54 97/85  Pulse: 99 88  Resp: 15 (!) 25  Temp:    SpO2:  97%    General: Alert, awake and oriented x3; reports feeling significantly better with her breathing back to baseline.  Still weak, but wants to go home.  No chest pain, no nausea, no vomiting, no fever. Cardiovascular: Rate controlled, irregular rhythm; positive systolic ejection murmur, no rubs, no gallops, no JVD. Respiratory: Improved air movement bilaterally; no using accessory muscles, normal respiratory effort.  Good oxygen saturation on room air. Abdomen: Soft, nontender, nondistended, positive bowel sounds. Extremities: No cyanosis or clubbing; positive trace edema bilaterally appreciated on exam.  Discharge Instructions   Discharge Instructions    (HEART FAILURE PATIENTS) Call MD:  Anytime you have any of the following symptoms: 1) 3 pound weight gain in 24 hours or 5 pounds in 1 week 2) shortness of breath, with or without a dry hacking cough 3) swelling in the hands, feet or stomach 4) if you have to sleep on extra pillows at night in order to breathe.   Complete by: As directed    Diet - low sodium heart healthy   Complete by: As directed     Increase activity slowly   Complete by: As directed      Allergies as of 09/13/2019      Reactions   Penicillins Anaphylaxis   Did it involve swelling of the face/tongue/throat, SOB, or low BP? Unknown Did it involve sudden or severe rash/hives, skin peeling, or any reaction on the inside of your mouth or nose? Unknown Did you need to seek medical attention at a hospital or doctor's office? Unknown When did it last happen? If all above answers are "NO", may proceed with cephalosporin use.   Biaxin [clarithromycin]    Ciprofloxacin Nausea And Vomiting   Emetrol Nausea Only   swelling   Feldene [piroxicam] Nausea And Vomiting   Sulfa Antibiotics    swelling      Medication List    STOP taking these medications   omeprazole 20 MG capsule Commonly known as: PRILOSEC     TAKE these medications   acetaminophen 325 MG tablet Commonly known as: TYLENOL Take 650 mg by mouth every 6 (six) hours as needed.   alendronate 70 MG tablet Commonly known as: FOSAMAX Take 1 tablet (70 mg total) by mouth once a week. Take with a full glass of water on an empty stomach.   amiodarone 200 MG tablet Commonly known as: Pacerone Take 1 tablet (200 mg total) by mouth 2 (two) times daily. Planning for 200mg  BID for 1 month and then 200 mg daily, as per cardiology recommendations.   apixaban 5 MG Tabs tablet Commonly known as: ELIQUIS Take 1 tablet (5 mg total) by mouth 2 (two) times daily.   benzonatate 100 MG capsule Commonly known as: Tessalon Perles Take 2 capsules (200 mg total) by mouth every 8 (eight) hours as needed (severe coughing spells).   calcium-vitamin D 500-200  MG-UNIT tablet Commonly known as: Os-Cal 500 + D Take 1 tablet by mouth 2 (two) times daily.   diltiazem 180 MG 24 hr capsule Commonly known as: Cardizem CD Take 1 capsule (180 mg total) by mouth daily. What changed:   medication strength  how much to take   magnesium oxide 400 MG tablet Commonly known as:  MAG-OX Take 1 tablet (400 mg total) by mouth 3 (three) times daily.   memantine 5 MG tablet Commonly known as: NAMENDA Take 1 tablet (5 mg total) by mouth 2 (two) times daily.   metoprolol tartrate 25 MG tablet Commonly known as: LOPRESSOR Take 1 tablet (25 mg total) by mouth 2 (two) times daily.   pantoprazole 40 MG tablet Commonly known as: PROTONIX Take 1 tablet (40 mg total) by mouth daily.   QUEtiapine 25 MG tablet Commonly known as: SEROQUEL Take 1 tablet (25 mg total) by mouth at bedtime.   sertraline 50 MG tablet Commonly known as: ZOLOFT Take 1 tablet (50 mg total) by mouth every morning.   SYSTANE OP Place 1 drop into both eyes daily as needed. Dry Eyes   torsemide 20 MG tablet Commonly known as: DEMADEX Take 1/2   tablet by mouth daily.  If your weight goes up as much as 3 pounds take 2 daily      Allergies  Allergen Reactions  . Penicillins Anaphylaxis    Did it involve swelling of the face/tongue/throat, SOB, or low BP? Unknown Did it involve sudden or severe rash/hives, skin peeling, or any reaction on the inside of your mouth or nose? Unknown Did you need to seek medical attention at a hospital or doctor's office? Unknown When did it last happen? If all above answers are "NO", may proceed with cephalosporin use.   . Biaxin [Clarithromycin]   . Ciprofloxacin Nausea And Vomiting  . Emetrol Nausea Only    swelling  . Feldene [Piroxicam] Nausea And Vomiting  . Sulfa Antibiotics     swelling   Follow-up Information    Corum, Rex Kras, MD. Schedule an appointment as soon as possible for a visit in 10 day(s).   Specialty: Family Medicine Contact information: [place] [address] [phone]        Satira Sark, MD .   Specialty: Cardiology Contact information: [place] [address] [phone]           The results of significant diagnostics from this hospitalization (including imaging,  microbiology, ancillary and laboratory) are listed below for reference.    Significant Diagnostic Studies: DG Lumbar Spine Complete  Result Date: 08/21/2019 CLINICAL DATA:  Low back pain, no known injury, initial encounter EXAM: LUMBAR SPINE - COMPLETE 4+ VIEW COMPARISON:  12/16/2013 FINDINGS: Five lumbar type vertebral bodies are well visualized. Vertebral body height is well maintained with the exception of chronic appearing compression deformity at L4 stable from the prior study. Mild osteophytic changes are seen. Facet hypertrophic changes are noted throughout the lumbar spine. Aortic calcifications are seen without aneurysmal dilatation. Mild scoliosis concave to the right is noted centered at L3-4 stable from the prior study. IMPRESSION: Degenerative changes and scoliosis stable from the previous study. Chronic L4 compression deformity. Electronically Signed   By: Inez Catalina M.D.   On: 08/21/2019 10:03   DG Chest Port 1 View  Result Date: 09/10/2019 CLINICAL DATA:  Post RIGHT thoracentesis EXAM: PORTABLE CHEST 1 VIEW COMPARISON:  Portable exam 1555 hours compared to 09/10/2019 FINDINGS: Decrease  in RIGHT pleural effusion and basilar atelectasis post thoracentesis. No pneumothorax. Enlargement of cardiac silhouette with pulmonary vascular congestion. Perihilar infiltrates question pulmonary edema. Persistent LEFT pleural effusion and basilar atelectasis. Mitral annular calcification noted. Atherosclerotic calcification aorta. Bones demineralized. IMPRESSION: No pneumothorax following RIGHT thoracentesis. Electronically Signed   By: Lavonia Dana M.D.   On: 09/10/2019 16:15   DG Chest Port 1 View  Result Date: 09/10/2019 CLINICAL DATA:  Shortness of breath EXAM: PORTABLE CHEST 1 VIEW COMPARISON:  05/02/2019 FINDINGS: Heart size is mildly enlarged, partially obscured. Calcific aortic knob. Moderate loculated appearing right-sided pleural effusion. Small left pleural effusion. Streaky bibasilar  opacities. No pneumothorax. IMPRESSION: 1. Moderate loculated appearing right-sided pleural effusion. 2. Small left pleural effusion. 3. Bibasilar opacities may reflect atelectasis and/or pneumonia. Electronically Signed   By: Davina Poke D.O.   On: 09/10/2019 13:11   CT RENAL STONE STUDY  Result Date: 09/10/2019 CLINICAL DATA:  Pain with urination, back pain EXAM: CT ABDOMEN AND PELVIS WITHOUT CONTRAST TECHNIQUE: Multidetector CT imaging of the abdomen and pelvis was performed following the standard protocol without IV contrast. COMPARISON:  04/13/2019 FINDINGS: Lower chest: Moderate right-sided pleural effusion with loculated component laterally. Small to moderate left-sided pleural effusion. Patchy bibasilar opacities. Thoracic aortic and coronary artery atherosclerotic calcifications. Mitral valve annular calcification. Hepatobiliary: No focal liver abnormality is seen. Status post cholecystectomy. No biliary dilatation. Pancreas: Unremarkable. No pancreatic ductal dilatation or surrounding inflammatory changes. Spleen: Normal in size without focal abnormality. Adrenals/Urinary Tract: Unremarkable adrenal glands. A single tiny punctate calculus within the superior pole of the right kidney as well as the inferior pole of left kidney. No hydronephrosis. Ureters are nondilated. No ureteral calculi identified. Urinary bladder appears unremarkable. Stomach/Bowel: Moderate to large hiatal hernia. No dilated loops of bowel. Intramural fatty deposition within the cecum and right colon as well as the terminal ileum likely reflecting sequela of chronic inflammatory bowel disease. Appendix not visualized and may be surgically absent. Extensive diverticulosis of the sigmoid and descending colon. No focal colonic thickening or pericolonic inflammatory changes. Vascular/Lymphatic: Aortic atherosclerosis. No enlarged abdominal or pelvic lymph nodes. Reproductive: Status post hysterectomy. No adnexal masses. Other: No  abdominal wall hernia or abnormality. No abdominopelvic ascites. Musculoskeletal: Chronic superior endplate compression deformity of L4. Prior right hip ORIF. No acute osseous findings. IMPRESSION: 1. Nonobstructing tiny bilateral renal calculi.  No hydronephrosis. 2. Moderate right-sided pleural effusion with loculated component. Small to moderate left-sided pleural effusion. Patchy bibasilar opacities may represent atelectasis or infiltrate. 3. Moderate to large hiatal hernia. 4. Extensive diverticulosis of the sigmoid and descending colon without evidence of acute diverticulitis. 5. Intramural fatty deposition within the cecum and right colon as well as the terminal ileum likely reflecting sequela of chronic inflammatory bowel disease. No CT evidence of acute bowel inflammation. Aortic Atherosclerosis (ICD10-I70.0). Electronically Signed   By: Davina Poke D.O.   On: 09/10/2019 14:18   DG HIP UNILAT W OR W/O PELVIS 2-3 VIEWS LEFT  Result Date: 08/21/2019 CLINICAL DATA:  Recent fall with left hip pain, initial encounter EXAM: DG HIP (WITH OR WITHOUT PELVIS) 2-3V LEFT COMPARISON:  11/14/2018 FINDINGS: Postsurgical changes are now seen in the proximal right femur. The pelvic ring is intact. Degenerative changes of lumbar spine are noted. Degenerative left hip changes are noted as well. No acute fracture or dislocation is noted. No soft tissue abnormality is seen. IMPRESSION: Postsurgical changes on the right. Degenerative change of the left hip without acute fracture. Electronically Signed   By: Elta Guadeloupe  Lukens M.D.   On: 08/21/2019 10:01   US THORACENTESIS ASP PLEURAL SPACE W/IMG GUIDE  Result Date: 09/10/2019 INDICATION: SOB Covid neg Rt Pleural effusion EXAM: ULTRASOUND GUIDED RIGHT THORACENTESIS MEDICATIONS: 10 cc 1% lidocaine. COMPLICATIONS: None immediate PROCEDURE: An ultrasound guided thoracentesis was thoroughly discussed with the patient and questions answered. The benefits, risks, alternatives and  complications were also discussed. The patient understands and wishes to proceed with the procedure. Written consent was obtained. Ultrasound was performed to localize and mark an adequate pocket of fluid in the right chest. The area was then prepped and draped in the normal sterile fashion. 1% Lidocaine was used for local anesthesia. Under ultrasound guidance a 19 G Yueh catheter was introduced. Thoracentesis was performed. The catheter was removed and a dressing applied. FINDINGS: A total of approximately 1.2 liters of golden fluid was removed. Samples were sent to the laboratory as requested by the clinical team. IMPRESSION: Successful ultrasound guided right thoracentesis yielding 1.2 L of pleural fluid. Read by Lavonia Drafts [facility] Electronically Signed   By: Lavonia Dana M.D.   On: 09/10/2019 15:47    Microbiology: Recent Results (from the past 240 hour(s))  Culture, blood (single) w Reflex to ID Panel     Status: None (Preliminary result)   Collection Time: 09/10/19 12:53 PM   Specimen: BLOOD RIGHT FOREARM  Result Value Ref Range Status   Specimen Description BLOOD RIGHT FOREARM  Final   Special Requests   Final    BOTTLES DRAWN AEROBIC AND ANAEROBIC Blood Culture adequate volume   Culture   Final    NO GROWTH 3 DAYS Performed at [facility], [address], [place], [place] [address]    Report Status PENDING  Incomplete  Gram stain     Status: None   Collection Time: 09/10/19  3:06 PM   Specimen: PATH Cytology Pleural fluid  Result Value Ref Range Status   Specimen Description PLEURAL  Final   Special Requests BOTTLES DRAWN AEROBIC AND ANAEROBIC  Final   Gram Stain   Final    NO ORGANISMS SEEN WBC PRESENT,BOTH PMN AND MONONUCLEAR CYTOSPIN SMEAR Performed at [facility], [address], [place], [place] [address]    Report Status 09/10/2019 FINAL  Final  Culture, body fluid-bottle     Status: None (Preliminary result)   Collection Time: 09/10/19  3:06 PM   Specimen: Pleura   Result Value Ref Range Status   Specimen Description PLEURAL  Final   Special Requests   Final    BOTTLES DRAWN AEROBIC AND ANAEROBIC Blood Culture adequate volume   Culture   Final    NO GROWTH 3 DAYS Performed at [facility], [address], [place], [place] [address]    Report Status PENDING  Incomplete  MRSA PCR Screening     Status: None   Collection Time: 09/10/19  6:58 PM   Specimen: Nasopharyngeal  Result Value Ref Range Status   MRSA by PCR NEGATIVE NEGATIVE Final    Comment:        The GeneXpert MRSA Assay (FDA approved for NASAL specimens only), is one component of a comprehensive MRSA colonization surveillance program. It is not intended to diagnose MRSA infection nor to guide or monitor treatment for MRSA infections. Performed at [facility], [address], [place], [place] [address]      Labs: Basic Metabolic Panel: Recent Labs  Lab 09/10/19 1253 09/11/19 0446 09/12/19 0501  NA 140 142 138  K 4.0 4.0 3.4*  CL  105 107 102  CO2 25 27 26   GLUCOSE 118* 104* 104*  BUN 21 21 25*  CREATININE 0.82 0.92 0.96  CALCIUM 9.7 9.1 8.7*  MG 2.1  --   --   PHOS 3.1  --   --    Liver Function Tests: Recent Labs  Lab 09/10/19 1253  AST 26  ALT 25  ALKPHOS 74  BILITOT 1.5*  PROT 7.5  ALBUMIN 3.9   CBC: Recent Labs  Lab 09/10/19 1253  WBC 9.2  HGB 10.5*  HCT 34.6*  MCV 84.4  PLT 274    BNP (last 3 results) Recent Labs    05/02/19 0505 05/09/19 0542 09/10/19 1253  BNP 473.0* 445.0* 1,555.0*    Signed:  Barton Dubois MD.  [facility] 09/13/2019, 12:19 PM

## 2019-09-13 NOTE — Progress Notes (Signed)
Patient is to be discharged home and in stable condition. Patient given discharge instructions and verbalized understanding. IV and telemetry removed, WNL. Patient has called daughter and is currently awaiting her arrival. Patient to be escorted out by staff via wheelchair upon daughter's arrival.  Celestia Khat, RN

## 2019-09-13 NOTE — TOC Transition Note (Signed)
Transition of Care [facility]) - CM/SW Discharge Note   Patient Details  Name: Kristy Mckinney MRN: [id number] Date of Birth: 1931-11-06  Transition of Care [facility]) CM/SW Contact:  Ihor Gully, LCSW Phone Number: 09/13/2019, 12:52 PM   Clinical Narrative:    Floydene Flock with [facility] notified of discharge. HH RN/PT orders are in.  TOC signing off.    Final next level of care: [place] Barriers to Discharge: Barriers Resolved   Patient Goals and CMS Choice Patient states their goals for this hospitalization and ongoing recovery are:: return home CMS Medicare.gov Compare Post Acute Care list provided to:: Patient Choice offered to / list presented to : Patient  Discharge Placement                       Discharge Plan and Services In-house Referral: Clinical Social Work   Post Acute Care Choice: Home Health                    HH Arranged: PT [facility] Agency: [place] ([facility]) Date [place]: 09/12/19   Representative spoke with at [place]: [place] ([address]) Interventions     Readmission Risk Interventions Readmission Risk Prevention Plan 09/12/2019 05/10/2019 05/05/2019  Post Dischage Appt - - -  Appt Comments - - -  Medication Screening - - -  Transportation Screening Complete Complete -  Medication Review Press photographer) Complete Complete -  PCP or Specialist appointment within 3-5 days of discharge - Complete -  [place] or [place] Complete Complete Complete  SW Recovery Care/Counseling Consult Complete Complete Complete  Palliative Care Screening Not Applicable Complete Complete  [place] Not Applicable Not Applicable Not Applicable  Some recent data might be hidden

## 2019-09-15 ENCOUNTER — Telehealth: Payer: Self-pay | Admitting: Family Medicine

## 2019-09-15 DIAGNOSIS — K219 Gastro-esophageal reflux disease without esophagitis: Secondary | ICD-10-CM | POA: Diagnosis not present

## 2019-09-15 DIAGNOSIS — G8929 Other chronic pain: Secondary | ICD-10-CM | POA: Diagnosis not present

## 2019-09-15 DIAGNOSIS — M1990 Unspecified osteoarthritis, unspecified site: Secondary | ICD-10-CM | POA: Diagnosis not present

## 2019-09-15 DIAGNOSIS — M545 Low back pain: Secondary | ICD-10-CM | POA: Diagnosis not present

## 2019-09-15 DIAGNOSIS — M810 Age-related osteoporosis without current pathological fracture: Secondary | ICD-10-CM | POA: Diagnosis not present

## 2019-09-15 DIAGNOSIS — J40 Bronchitis, not specified as acute or chronic: Secondary | ICD-10-CM | POA: Diagnosis not present

## 2019-09-15 DIAGNOSIS — M26602 Left temporomandibular joint disorder, unspecified: Secondary | ICD-10-CM | POA: Diagnosis not present

## 2019-09-15 DIAGNOSIS — R1310 Dysphagia, unspecified: Secondary | ICD-10-CM | POA: Diagnosis not present

## 2019-09-15 DIAGNOSIS — I110 Hypertensive heart disease with heart failure: Secondary | ICD-10-CM | POA: Diagnosis not present

## 2019-09-15 DIAGNOSIS — I4819 Other persistent atrial fibrillation: Secondary | ICD-10-CM | POA: Diagnosis not present

## 2019-09-15 DIAGNOSIS — E559 Vitamin D deficiency, unspecified: Secondary | ICD-10-CM | POA: Diagnosis not present

## 2019-09-15 DIAGNOSIS — J90 Pleural effusion, not elsewhere classified: Secondary | ICD-10-CM | POA: Diagnosis not present

## 2019-09-15 DIAGNOSIS — I5033 Acute on chronic diastolic (congestive) heart failure: Secondary | ICD-10-CM | POA: Diagnosis not present

## 2019-09-15 LAB — CYTOLOGY - NON PAP

## 2019-09-15 LAB — CULTURE, BODY FLUID W GRAM STAIN -BOTTLE
Culture: NO GROWTH
Special Requests: ADEQUATE

## 2019-09-15 LAB — CULTURE, BLOOD (SINGLE)
Culture: NO GROWTH
Special Requests: ADEQUATE

## 2019-09-15 NOTE — Telephone Encounter (Signed)
Orders given.  

## 2019-09-15 NOTE — Telephone Encounter (Signed)
Amy is calling from [place] and is requesting verbal orders for physical therapy for this patient.   2x a week for 2 weeks then 1x a week for 6 weeks.   CB# [phone]

## 2019-09-22 ENCOUNTER — Ambulatory Visit (INDEPENDENT_AMBULATORY_CARE_PROVIDER_SITE_OTHER): Payer: PPO | Admitting: Family Medicine

## 2019-09-22 ENCOUNTER — Encounter: Payer: Self-pay | Admitting: Family Medicine

## 2019-09-22 ENCOUNTER — Other Ambulatory Visit: Payer: Self-pay

## 2019-09-22 VITALS — BP 108/67 | HR 70 | Temp 97.6°F | Ht 64.0 in | Wt 135.2 lb

## 2019-09-22 DIAGNOSIS — I10 Essential (primary) hypertension: Secondary | ICD-10-CM | POA: Diagnosis not present

## 2019-09-22 NOTE — Patient Instructions (Addendum)
(HEART FAILURE PATIENTS) Call [phone]  Anytime you have any of the following symptoms: 1) 3 pound weight gain in 24 hours or 5 pounds in 1 week 2) shortness of breath, with or without a dry hacking cough 3) swelling in the hands, feet or stomach 4) if you have to sleep on extra pillows at night in order to breathe.        Diet - low sodium heart healthy   Complete by: As directed    Increase activity slowly   Complete by: As directed    Complete labwork as recommended at hospital discharge at [facility]  Potassium Content of Foods  Potassium is a mineral found in many foods and drinks. It affects how the heart works, and helps keep fluids and minerals balanced in the body. The amount of potassium you need each day depends on your age and any medical conditions you may have. Talk to your health care provider or dietitian about how much potassium you need. The following lists of foods provide the general serving size for foods and the approximate amount of potassium in each serving, listed in milligrams (mg). Actual values may vary depending on the product and how it is processed. High in potassium The following foods and beverages have 200 mg or more of potassium per serving:  Apricots (raw) - 2 have 200 mg of potassium.  Apricots (dry) - 5 have 200 mg of potassium.  Artichoke - 1 medium has 345 mg of potassium.  Avocado -  fruit has 245 mg of potassium.  Banana - 1 medium fruit has 425 mg of potassium.  [place] or baked beans (canned) -  cup has 280 mg of potassium.  White beans (canned) -  cup has 595 mg potassium.  Beef roast - 3 oz has 320 mg of potassium.  Ground beef - 3 oz has 270 mg of potassium.  Beets (raw or cooked) -  cup has 260 mg of potassium.  Bran muffin - 2 oz has 300 mg of potassium.  Broccoli (cooked) -  cup has 230 mg of potassium.  Brussels sprouts -  cup has 250 mg of potassium.  Cantaloupe -  cup has 215 mg of potassium.  Cereal, 100%  bran -  cup has 200-400 mg of potassium.  Cheeseburger -1 single fast food burger has 225-400 mg of potassium.  Chicken - 3 oz has 220 mg of potassium.  Clams (canned) - 3 oz has 535 mg of potassium.  Crab - 3 oz has 225 mg of potassium.  Dates - 5 have 270 mg of potassium.  Dried beans and peas -  cup has 300-475 mg of potassium.  Figs (dried) - 2 have 260 mg of potassium.  Fish (halibut, tuna, cod, snapper) - 3 oz has 480 mg of potassium.  Fish (salmon, haddock, swordfish, perch) - 3 oz has 300 mg of potassium.  Fish (tuna, canned) - 3 oz has 200 mg of potassium.  [place] fries (fast food) - 3 oz has 470 mg of potassium.  Granola with fruit and nuts -  cup has 200 mg of potassium.  Grapefruit juice -  cup has 200 mg of potassium.  Honeydew melon -  cup has 200 mg of potassium.  Kale (raw) - 1 cup has 300 mg of potassium.  Kiwi - 1 medium fruit has 240 mg of potassium.  Kohlrabi, rutabaga, parsnips -  cup has 280 mg of potassium.  Lentils -  cup has 365 mg of potassium.  Mango - 1 each has 325 mg of potassium.  Milk (nonfat, low-fat, whole, buttermilk) - 1 cup has 350-380 mg of potassium.  Milk (chocolate) - 1 cup has 420 mg of potassium  Molasses - 1 Tbsp has 295 mg of potassium.  Mushrooms -  cup has 280 mg of potassium.  Nectarine - 1 each has 275 mg of potassium.  Nuts (almonds, peanuts, hazelnuts, [place], cashew, mixed) - 1 oz has 200 mg of potassium.  Nuts (pistachios) - 1 oz has 295 mg of potassium.  Orange - 1 fruit has 240 mg of potassium.  Orange juice -  cup has 235 mg of potassium.  Papaya -  medium fruit has 390 mg of potassium.  Peanut butter (chunky) - 2 Tbsp has 240 mg of potassium.  Peanut butter (smooth) - 2 Tbsp has 210 mg of potassium.  Pear - 1 medium (200 mg) of potassium.  Pomegranate - 1 whole fruit has 400 mg of potassium.  Pomegranate juice -  cup has 215 mg of potassium.  Pork - 3 oz has 350 mg of  potassium.  Potato chips (salted) - 1 oz has 465 mg of potassium.  Potato (baked with skin) - 1 medium has 925 mg of potassium.  Potato (boiled) -  cup has 255 mg of potassium.  Potato (Mashed) -  cup has 330 mg of potassium.  Prune juice -  cup has 370 mg of potassium.  Prunes - 5 have 305 mg of potassium.  Pudding (chocolate) -  cup has 230 mg of potassium.  Pumpkin (canned) -  cup has 250 mg of potassium.  Raisins (seedless) -  cup has 270 mg of potassium.  Seeds (sunflower or pumpkin) - 1 oz has 240 mg of potassium.  Soy milk - 1 cup has 300 mg of potassium.  Spinach (cooked) - 1/2 cup has 420 mg of potassium.  Spinach (canned) -  cup has 370 mg of potassium.  Sweet potato (baked with skin) - 1 medium has 450 mg of potassium.  Swiss chard -  cup has 480 mg of potassium.  Tomato or vegetable juice -  cup has 275 mg of potassium.  Tomato (sauce or puree) -  cup has 400-550 mg of potassium.  Tomato (raw) - 1 medium has 290 mg of potassium.  Tomato (canned) -  cup has 200-300 mg of potassium.  [place] - 3 oz has 250 mg of potassium.  Wheat germ - 1 oz has 250 mg of potassium.  Winter squash -  cup has 250 mg of potassium.  Yogurt (plain or fruited) - 6 oz has 260-435 mg of potassium.  Zucchini -  cup has 220 mg of potassium. Moderate in potassium The following foods and beverages have 50-200 mg of potassium per serving:  Apple - 1 fruit has 150 mg of potassium  Apple juice -  cup has 150 mg of potassium  Applesauce -  cup has 90 mg of potassium  Apricot nectar -  cup has 140 mg of potassium  Asparagus (small spears) -  cup has 155 mg of potassium  Asparagus (large spears) - 6 have 155 mg of potassium  Bagel (cinnamon raisin) - 1 four-inch bagel has 130 mg of potassium  Bagel (egg or plain) - 1 four- inch bagel has 70 mg of potassium  Beans (green) -  cup has 90 mg of potassium  Beans (yellow) -  cup has 190 mg of  potassium  Beer, regular - 12 oz  has 100 mg of potassium  Beets (canned) -  cup has 125 mg of potassium  Blackberries -  cup has 115 mg of potassium  Blueberries -  cup has 60 mg of potassium  Bread (whole wheat) - 1 slice has 70 mg of potassium  Broccoli (raw) -  cup has 145 mg of potassium  Cabbage -  cup has 150 mg of potassium  Carrots (cooked or raw) -  cup has 180 mg of potassium  Cauliflower (raw) -  cup has 150 mg of potassium  Celery (raw) -  cup has 155 mg of potassium  Cereal, bran flakes -  cup has 120-150 mg of potassium  Cheese (cottage) -  cup has 110 mg of potassium  Cherries - 10 have 150 mg of potassium  Chocolate - 1 oz bar has 165 mg of potassium  Coffee (brewed) - 6 oz has 90 mg of potassium  Corn -  cup or 1 ear has 195 mg of potassium  Cucumbers -  cup has 80 mg of potassium  Egg - 1 large egg has 60 mg of potassium  Eggplant -  cup has 60 mg of potassium  Endive (raw) -  cup has 80 mg of potassium  English muffin - 1 has 65 mg of potassium  Fish (ocean perch) - 3 oz has 192 mg of potassium  Frankfurter, beef or pork - 1 has 75 mg of potassium  Fruit cocktail -  cup has 115 mg of potassium  Grape juice -  cup has 170 mg of potassium  Grapefruit -  fruit has 175 mg of potassium  Grapes -  cup has 155 mg of potassium  Greens: kale, turnip, collard -  cup has 110-150 mg of potassium  Ice cream or frozen yogurt (chocolate) -  cup has 175 mg of potassium  Ice cream or frozen yogurt (vanilla) -  cup has 120-150 mg of potassium  Lemons, limes - 1 each has 80 mg of potassium  Lettuce - 1 cup has 100 mg of potassium  Mixed vegetables -  cup has 150 mg of potassium  Mushrooms, raw -  cup has 110 mg of potassium  Nuts (walnuts, pecans, or macadamia) - 1 oz has 125 mg of potassium  Oatmeal -  cup has 80 mg of potassium  Okra -  cup has 110 mg of potassium  Onions -  cup has 120 mg of potassium  Peach -  1 has 185 mg of potassium  Peaches (canned) -  cup has 120 mg of potassium  Pears (canned) -  cup has 120 mg of potassium  Peas, green (frozen) -  cup has 90 mg of potassium  Peppers (Green) -  cup has 130 mg of potassium  Peppers (Red) -  cup has 160 mg of potassium  Pineapple juice -  cup has 165 mg of potassium  Pineapple (fresh or canned) -  cup has 100 mg of potassium  Plums - 1 has 105 mg of potassium  Pudding, vanilla -  cup has 150 mg of potassium  Raspberries -  cup has 90 mg of potassium  Rhubarb -  cup has 115 mg of potassium  Rice, wild -  cup has 80 mg of potassium  Shrimp - 3 oz has 155 mg of potassium  Spinach (raw) - 1 cup has 170 mg of potassium  Strawberries -  cup has 125 mg of potassium  Summer squash -  cup has 175-200 mg  of potassium  Swiss chard (raw) - 1 cup has 135 mg of potassium  Tangerines - 1 fruit has 140 mg of potassium  Tea, brewed - 6 oz has 65 mg of potassium  Turnips -  cup has 140 mg of potassium  Watermelon -  cup has 85 mg of potassium  Wine (Red, table) - 5 oz has 180 mg of potassium  Wine (White, table) - 5 oz 100 mg of potassium Low in potassium The following foods and beverages have less than 50 mg of potassium per serving.  Bread (white) - 1 slice has 30 mg of potassium  Carbonated beverages - 12 oz has less than 5 mg of potassium  Cheese - 1 oz has 20-30 mg of potassium  Cranberries -  cup has 45 mg of potassium  Cranberry juice cocktail -  cup has 20 mg of potassium  Fats and oils - 1 Tbsp has less than 5 mg of potassium  Hummus - 1 Tbsp has 32 mg of potassium  Nectar (papaya, mango, or pear) -  cup has 35 mg of potassium  Rice (white or brown) -  cup has 50 mg of potassium  Spaghetti or macaroni (cooked) -  cup has 30 mg of potassium  Tortilla, flour or corn - 1 has 50 mg of potassium  Waffle - 1 four-inch waffle has 50 mg of potassium  Water chestnuts -  cup has 40 mg of  potassium Summary  Potassium is a mineral found in many foods and drinks. It affects how the heart works, and helps keep fluids and minerals balanced in the body.  The amount of potassium you need each day depends on your age and any existing medical conditions you may have. Your health care provider or dietitian may recommend an amount of potassium that you should have each day. This information is not intended to replace advice given to you by your health care provider. Make sure you discuss any questions you have with your health care provider. Document Revised: 06/08/2017 Document Reviewed: 09/20/2016 Elsevier Patient Education  [place].

## 2019-09-22 NOTE — Progress Notes (Signed)
Established Patient Office Visit  Subjective:  Patient ID: Kristy Mckinney, female    DOB: 06-08-1932  Age: 86 y.o. MRN: [id number]  CC:  Chief Complaint  Patient presents with  . Hospitalization Follow-up    was dicharged from the hospital on 09/13/19. Feel a little weak but other than that feel ok    HPI Kristy Mckinney presents for daughter helping pt with blood pressure reading.  Pt states she is taking medication as per her daughters assistance-daughter places in pill boxes every Sunday. Pt states weak but overall feeling better. No SOB, no cough. Pt is taking no medication for cough.  Pt states PT coming 2x weekly.   Past Medical History:  Diagnosis Date  . Abdominal distension (gaseous)   . Acute on chronic diastolic heart failure ([place])   . Age-related osteoporosis without current pathological fracture   . Anemia, unspecified   . Anxiety disorder, unspecified   . Atrial fibrillation ([place])   . Bronchitis, not specified as acute or chronic   . Chronic back pain   . Diarrhea   . Disorder of the skin and subcutaneous tissue, unspecified   . Dysphagia 05/03/2016  . Essential (primary) hypertension   . Gastro-esophageal reflux disease with esophagitis   . GERD (gastroesophageal reflux disease)   . Iron deficiency anemia, unspecified   . Irritable bowel syndrome with diarrhea   . Left temporomandibular joint disorder, unspecified   . Lumbago   . Pneumonia   . Shortness of breath   . Unspecified dementia without behavioral disturbance ([place])   . Unspecified fracture of unspecified wrist and hand, initial encounter for closed fracture   . Vitamin D deficiency, unspecified     Past Surgical History:  Procedure Laterality Date  . ABDOMINAL HYSTERECTOMY    . BACK SURGERY    . CHOLECYSTECTOMY N/A 01/29/2014   Procedure: LAPAROSCOPIC CHOLECYSTECTOMY;  Surgeon: Scherry Ran, MD;  Location: [facility];  Service: General;  Laterality: N/A;  . COLONOSCOPY N/A 10/27/2015   Procedure: COLONOSCOPY;  Surgeon: Rogene Houston, MD;  Location: [facility];  Service: Endoscopy;  Laterality: N/A;  215  . INTRAMEDULLARY (IM) NAIL INTERTROCHANTERIC Right 11/29/2018   Procedure: INTRAMEDULLARY (IM) NAIL INTERTROCHANTRIC FRACTURE;  Surgeon: Renette Butters, MD;  Location: [facility];  Service: Orthopedics;  Laterality: Right;  . TONSILLECTOMY    . YAG LASER APPLICATION Left [id number]   Procedure: YAG LASER APPLICATION;  Surgeon: Elta Guadeloupe T. Gershon Crane, MD;  Location: [facility];  Service: Ophthalmology;  Laterality: Left;  left    Family History  Problem Relation Age of Onset  . Hypertension Child     Social History   Socioeconomic History  . Marital status: Widowed    Spouse name: Not on file  . Number of children: Not on file  . Years of education: Not on file  . Highest education level: Not on file  Occupational History  . Occupation: retired  Tobacco Use  . Smoking status: Never Smoker  . Smokeless tobacco: Never Used  Substance and Sexual Activity  . Alcohol use: No  . Drug use: No  . Sexual activity: Yes    Birth control/protection: Surgical  Other Topics Concern  . Not on file  Social History Narrative   Pt lives in an apartment complex, reports that she still drives   Social Determinants of Health   Financial Resource Strain: Low Risk   . Difficulty of Paying Living Expenses: Not hard at all  Food Insecurity: No  Food Insecurity  . Worried About Charity fundraiser in the Last Year: Never true  . Ran Out of Food in the Last Year: Never true  Transportation Needs: No Transportation Needs  . Lack of Transportation (Medical): No  . Lack of Transportation (Non-Medical): No  Physical Activity: Sufficiently Active  . Days of Exercise per Week: 5 days  . Minutes of Exercise per Session: 30 min  Stress: No Stress Concern Present  . Feeling of Stress : Not at all  Social Connections: Slightly Isolated  . Frequency of Communication with Friends and Family:  More than three times a week  . Frequency of Social Gatherings with Friends and Family: More than three times a week  . Attends Religious Services: More than 4 times per year  . Active Member of Clubs or Organizations: Yes  . Attends Archivist Meetings: More than 4 times per year  . Marital Status: Widowed  Intimate Partner Violence: Not At Risk  . Fear of Current or Ex-Partner: No  . Emotionally Abused: No  . Physically Abused: No  . Sexually Abused: No    Outpatient Medications Prior to Visit  Medication Sig Dispense Refill  . acetaminophen (TYLENOL) 325 MG tablet Take 650 mg by mouth every 6 (six) hours as needed.    Marland Kitchen alendronate (FOSAMAX) 70 MG tablet Take 1 tablet (70 mg total) by mouth once a week. Take with a full glass of water on an empty stomach. 4 tablet 0  . amiodarone (PACERONE) 200 MG tablet Take 1 tablet (200 mg total) by mouth 2 (two) times daily. Planning for 200mg  BID for 1 month and then 200 mg daily, as per cardiology recommendations. 60 tablet 1  . apixaban (ELIQUIS) 5 MG TABS tablet Take 1 tablet (5 mg total) by mouth 2 (two) times daily. 60 tablet 5  . benzonatate (TESSALON PERLES) 100 MG capsule Take 2 capsules (200 mg total) by mouth every 8 (eight) hours as needed (severe coughing spells). 30 capsule 0  . calcium-vitamin D (OS-CAL 500 + D) 500-200 MG-UNIT tablet Take 1 tablet by mouth 2 (two) times daily.    Marland Kitchen diltiazem (CARDIZEM CD) 180 MG 24 hr capsule Take 1 capsule (180 mg total) by mouth daily. 30 capsule 2  . magnesium oxide (MAG-OX) 400 MG tablet Take 1 tablet (400 mg total) by mouth 3 (three) times daily. 90 tablet 0  . memantine (NAMENDA) 5 MG tablet Take 1 tablet (5 mg total) by mouth 2 (two) times daily. 60 tablet 0  . metoprolol tartrate (LOPRESSOR) 25 MG tablet Take 1 tablet (25 mg total) by mouth 2 (two) times daily. 60 tablet 5  . pantoprazole (PROTONIX) 40 MG tablet Take 1 tablet (40 mg total) by mouth daily. 30 tablet 3  . Polyethyl  Glycol-Propyl Glycol (SYSTANE OP) Place 1 drop into both eyes daily as needed. Dry Eyes     . QUEtiapine (SEROQUEL) 25 MG tablet Take 1 tablet (25 mg total) by mouth at bedtime. 30 tablet 0  . sertraline (ZOLOFT) 50 MG tablet Take 1 tablet (50 mg total) by mouth every morning. 30 tablet 0  . torsemide (DEMADEX) 20 MG tablet Take 1/2   tablet by mouth daily.  If your weight goes up as much as 3 pounds take 2 daily 90 tablet 3   No facility-administered medications prior to visit.    Allergies  Allergen Reactions  . Penicillins Anaphylaxis    Did it involve swelling of the face/tongue/throat, SOB,  or low BP? Unknown Did it involve sudden or severe rash/hives, skin peeling, or any reaction on the inside of your mouth or nose? Unknown Did you need to seek medical attention at a hospital or doctor's office? Unknown When did it last happen? If all above answers are "NO", may proceed with cephalosporin use.   . Biaxin [Clarithromycin]   . Ciprofloxacin Nausea And Vomiting  . Emetrol Nausea Only    swelling  . Feldene [Piroxicam] Nausea And Vomiting  . Sulfa Antibiotics     swelling    ROS Review of Systems  Eyes:       Glasses  Respiratory: Positive for shortness of breath.        SOB has improved  Cardiovascular: Positive for leg swelling. Negative for chest pain and palpitations.       CHF  Psychiatric/Behavioral:       Dementia      Objective:    Physical Exam  Constitutional: She is oriented to person, place, and time. She appears well-developed and well-nourished.  Eyes: Conjunctivae are normal.  Cardiovascular: Normal rate, regular rhythm and normal heart sounds.  Pulmonary/Chest: Effort normal and breath sounds normal.  Neurological: She is alert and oriented to person, place, and time.  Psychiatric: She has a normal mood and affect. Her behavior is normal.    BP 108/67 (BP Location: Left Arm, Patient Position: Sitting, Cuff Size: Normal)   Pulse 70   Temp  97.6 F (36.4 C) (Temporal)   Ht 5\' 4"  (1.626 m)   Wt 135 lb 3.2 oz (61.3 kg)   SpO2 96%   BMI 23.21 kg/m  Wt Readings from Last 3 Encounters:  09/22/19 135 lb 3.2 oz (61.3 kg)  09/13/19 139 lb 8.8 oz (63.3 kg)  09/02/19 134 lb (60.8 kg)    Lab Results  Component Value Date   TSH 1.877 09/10/2019   Lab Results  Component Value Date   WBC 9.2 09/10/2019   HGB 10.5 (L) 09/10/2019   HCT 34.6 (L) 09/10/2019   MCV 84.4 09/10/2019   PLT 274 09/10/2019   Lab Results  Component Value Date   NA 138 09/12/2019   K 3.4 (L) 09/12/2019   CO2 26 09/12/2019   GLUCOSE 104 (H) 09/12/2019   BUN 25 (H) 09/12/2019   CREATININE 0.96 09/12/2019   BILITOT 1.5 (H) 09/10/2019   ALKPHOS 74 09/10/2019   AST 26 09/10/2019   ALT 25 09/10/2019   PROT 7.5 09/10/2019   ALBUMIN 3.9 09/10/2019   CALCIUM 8.7 (L) 09/12/2019   ANIONGAP 10 09/12/2019   Lab Results  Component Value Date   CHOL 186 03/08/2018   Lab Results  Component Value Date   HDL 63 03/08/2018   Lab Results  Component Value Date   LDLCALC 106 03/08/2018   Lab Results  Component Value Date   TRIG 83 03/08/2018   No results found for: [facility] Lab Results  Component Value Date   HGBA1C 5.6 08/21/2019      Assessment & Plan:  1. Essential (primary) hypertension/afib/CHF-admission 3/3-3/6-reviewed chart Pt being followed by cardiology-appt on Friday for follow up-BMP at discharge recommended. Pt agrees to go today.  Continue outpatient medications as per cardiology recommendations.   - Basic metabolic panel-today Follow-up: cardiology on Friday  Ari Bernabei Hannah Beat, MD

## 2019-09-23 ENCOUNTER — Telehealth: Payer: Self-pay | Admitting: Emergency Medicine

## 2019-09-23 LAB — BASIC METABOLIC PANEL
BUN/Creatinine Ratio: 21 (calc) (ref 6–22)
BUN: 24 mg/dL (ref 7–25)
CO2: 32 mmol/L (ref 20–32)
Calcium: 9.7 mg/dL (ref 8.6–10.4)
Chloride: 100 mmol/L (ref 98–110)
Creat: 1.15 mg/dL — ABNORMAL HIGH (ref 0.60–0.88)
Glucose, Bld: 93 mg/dL (ref 65–99)
Potassium: 4.2 mmol/L (ref 3.5–5.3)
Sodium: 140 mmol/L (ref 135–146)

## 2019-09-23 NOTE — Telephone Encounter (Signed)
-----   Message from [name], MD sent at 09/23/2019  8:19 AM EDT ----- Kristy Mckinney looks great!

## 2019-09-26 ENCOUNTER — Encounter: Payer: Self-pay | Admitting: Student

## 2019-09-26 ENCOUNTER — Other Ambulatory Visit: Payer: Self-pay

## 2019-09-26 ENCOUNTER — Ambulatory Visit (INDEPENDENT_AMBULATORY_CARE_PROVIDER_SITE_OTHER): Payer: PPO | Admitting: Student

## 2019-09-26 VITALS — BP 118/64 | HR 62 | Temp 97.2°F | Ht 64.5 in | Wt 134.0 lb

## 2019-09-26 DIAGNOSIS — I38 Endocarditis, valve unspecified: Secondary | ICD-10-CM

## 2019-09-26 DIAGNOSIS — I5032 Chronic diastolic (congestive) heart failure: Secondary | ICD-10-CM | POA: Diagnosis not present

## 2019-09-26 DIAGNOSIS — I4819 Other persistent atrial fibrillation: Secondary | ICD-10-CM

## 2019-09-26 MED ORDER — AMIODARONE HCL 200 MG PO TABS: 200.0000 mg | ORAL_TABLET | Freq: Two times a day (BID) | ORAL | 1 refills | Status: DC

## 2019-09-26 NOTE — Addendum Note (Signed)
Addended by: Levonne Hubert on: 09/26/2019 04:38 PM   Modules accepted: Orders

## 2019-09-26 NOTE — Patient Instructions (Signed)
Medication Instructions:  Your physician has recommended you make the following change in your medication:  Decrease Amiodarone to 200 mg Daily on 10-13-19  *If you need a refill on your cardiac medications before your next appointment, please call your pharmacy*   Lab Work: NONE   If you have labs (blood work) drawn today and your tests are completely normal, you will receive your results only by: Marland Kitchen MyChart Message (if you have MyChart) OR . A paper copy in the mail If you have any lab test that is abnormal or we need to change your treatment, we will call you to review the results.   Testing/Procedures: NONE    Follow-Up: At [facility], you and your health needs are our priority.  As part of our continuing mission to provide you with exceptional heart care, we have created designated Provider Care Teams.  These Care Teams include your primary Cardiologist (physician) and Advanced Practice Providers (APPs -  Physician Assistants and Nurse Practitioners) who all work together to provide you with the care you need, when you need it.  We recommend signing up for the patient portal called "MyChart".  Sign up information is provided on this After Visit Summary.  MyChart is used to connect with patients for Virtual Visits (Telemedicine).  Patients are able to view lab/test results, encounter notes, upcoming appointments, etc.  Non-urgent messages can be sent to your provider as well.   To learn more about what you can do with MyChart, go to [email or web address].    Your next appointment:   2-3 month(s)  The format for your next appointment:   In Person  Provider:   Dr. Domenic Polite    Other Instructions Thank you for choosing [facility]!

## 2019-09-26 NOTE — Progress Notes (Signed)
Cardiology Office Note    Date:  09/26/2019   ID:  Kristy Mckinney, DOB Jan 15, 1932, MRN [id number]  PCP:  [name], MD  Cardiologist: Rozann Lesches, MD    Chief Complaint  Patient presents with  . Hospitalization Follow-up    History of Present Illness:    Kristy Mckinney is a 86 y.o. female with past medical history of persistent atrial fibrillation (diagnosed in 11/2018), chronic diastolic CHF, mild AS, mild MS and chronic back pain who presents to the office today for hospital follow-up.   She most recently presented to [facility] ED on 09/10/2019 after having a fall and developing nausea and AMS. Reported worsening dyspnea on exertion and weakness as well with home weights being stable at 134 - 136 lbs. BNP was elevated to 1555 and CXR showed a right-sided pleural effusion, therefore she underwent thoracentesis with 1.2L of fluid removed. She was in atrial fibrillation with RVR upon admission and received IV Cardizem. She was transitioned to short-acting Cardizem 90mg  Q8H and Lopressor 25mg  BID. Given that it was her 3rd admission since initial diagnosis, it was felt rate-control was not working and had led to her recurrent CHF exacerbations. She was therefore started on IV Amiodarone at an attempt to convert back to NSR. She was transitioned to Amiodarone 200mg  BID for 4 weeks followed by 200mg  daily at discharge along with Cardizem CD 180mg  daily and Lopressor 25mg  BID while remaining on Eliquis for anticoagulation.   In talking with the patient today, she reports overall doing well since her recent hospitalization. She has felt weak but says this continues to improve on a daily basis. She is sleeping better at night since Seroquel was restarted during her admission.  She reports having baseline dyspnea on exertion but no acute changes in this.  No reported orthopnea, PND, lower extremity edema, chest pain or palpitations.  She reports good compliance with Torsemide 10 mg  daily and has only had to take an extra half tablet once in the past few weeks.   Past Medical History:  Diagnosis Date  . Abdominal distension (gaseous)   . Acute on chronic diastolic heart failure ([place])   . Age-related osteoporosis without current pathological fracture   . Anemia, unspecified   . Anxiety disorder, unspecified   . Atrial fibrillation ([place])   . Bronchitis, not specified as acute or chronic   . Chronic back pain   . Diarrhea   . Disorder of the skin and subcutaneous tissue, unspecified   . Dysphagia 05/03/2016  . Essential (primary) hypertension   . Gastro-esophageal reflux disease with esophagitis   . GERD (gastroesophageal reflux disease)   . Iron deficiency anemia, unspecified   . Irritable bowel syndrome with diarrhea   . Left temporomandibular joint disorder, unspecified   . Lumbago   . Pneumonia   . Shortness of breath   . Unspecified dementia without behavioral disturbance ([place])   . Unspecified fracture of unspecified wrist and hand, initial encounter for closed fracture   . Vitamin D deficiency, unspecified     Past Surgical History:  Procedure Laterality Date  . ABDOMINAL HYSTERECTOMY    . BACK SURGERY    . CHOLECYSTECTOMY N/A 01/29/2014   Procedure: LAPAROSCOPIC CHOLECYSTECTOMY;  Surgeon: Scherry Ran, MD;  Location: [facility];  Service: General;  Laterality: N/A;  . COLONOSCOPY N/A 10/27/2015   Procedure: COLONOSCOPY;  Surgeon: Rogene Houston, MD;  Location: [facility];  Service: Endoscopy;  Laterality: N/A;  215  .  INTRAMEDULLARY (IM) NAIL INTERTROCHANTERIC Right 11/29/2018   Procedure: INTRAMEDULLARY (IM) NAIL INTERTROCHANTRIC FRACTURE;  Surgeon: Renette Butters, MD;  Location: [place];  Service: Orthopedics;  Laterality: Right;  . TONSILLECTOMY    . YAG LASER APPLICATION Left [id number]   Procedure: YAG LASER APPLICATION;  Surgeon: Elta Guadeloupe T. Gershon Crane, MD;  Location: [facility];  Service: Ophthalmology;  Laterality: Left;  left    Current  Medications: Outpatient Medications Prior to Visit  Medication Sig Dispense Refill  . acetaminophen (TYLENOL) 325 MG tablet Take 650 mg by mouth every 6 (six) hours as needed.    Marland Kitchen alendronate (FOSAMAX) 70 MG tablet Take 1 tablet (70 mg total) by mouth once a week. Take with a full glass of water on an empty stomach. 4 tablet 0  . apixaban (ELIQUIS) 5 MG TABS tablet Take 1 tablet (5 mg total) by mouth 2 (two) times daily. 60 tablet 5  . benzonatate (TESSALON PERLES) 100 MG capsule Take 2 capsules (200 mg total) by mouth every 8 (eight) hours as needed (severe coughing spells). 30 capsule 0  . calcium-vitamin D (OS-CAL 500 + D) 500-200 MG-UNIT tablet Take 1 tablet by mouth 2 (two) times daily.    Marland Kitchen diltiazem (CARDIZEM CD) 180 MG 24 hr capsule Take 1 capsule (180 mg total) by mouth daily. 30 capsule 2  . magnesium oxide (MAG-OX) 400 MG tablet Take 1 tablet (400 mg total) by mouth 3 (three) times daily. 90 tablet 0  . memantine (NAMENDA) 5 MG tablet Take 1 tablet (5 mg total) by mouth 2 (two) times daily. 60 tablet 0  . metoprolol tartrate (LOPRESSOR) 25 MG tablet Take 1 tablet (25 mg total) by mouth 2 (two) times daily. 60 tablet 5  . pantoprazole (PROTONIX) 40 MG tablet Take 1 tablet (40 mg total) by mouth daily. 30 tablet 3  . Polyethyl Glycol-Propyl Glycol (SYSTANE OP) Place 1 drop into both eyes daily as needed. Dry Eyes     . QUEtiapine (SEROQUEL) 25 MG tablet Take 1 tablet (25 mg total) by mouth at bedtime. 30 tablet 0  . sertraline (ZOLOFT) 50 MG tablet Take 1 tablet (50 mg total) by mouth every morning. 30 tablet 0  . torsemide (DEMADEX) 20 MG tablet Take 1/2   tablet by mouth daily.  If your weight goes up as much as 3 pounds take 2 daily 90 tablet 3  . amiodarone (PACERONE) 200 MG tablet Take 1 tablet (200 mg total) by mouth 2 (two) times daily. Planning for 200mg  BID for 1 month and then 200 mg daily, as per cardiology recommendations. 60 tablet 1   No facility-administered medications  prior to visit.     Allergies:   Penicillins, Biaxin [clarithromycin], Ciprofloxacin, Emetrol, Feldene [piroxicam], and Sulfa antibiotics   Social History   Socioeconomic History  . Marital status: Widowed    Spouse name: Not on file  . Number of children: Not on file  . Years of education: Not on file  . Highest education level: Not on file  Occupational History  . Occupation: retired  Tobacco Use  . Smoking status: Never Smoker  . Smokeless tobacco: Never Used  Substance and Sexual Activity  . Alcohol use: No  . Drug use: No  . Sexual activity: Yes    Birth control/protection: Surgical  Other Topics Concern  . Not on file  Social History Narrative   Pt lives in an apartment complex, reports that she still drives   Social Determinants of Health  Financial Resource Strain: Low Risk   . Difficulty of Paying Living Expenses: Not hard at all  Food Insecurity: No Food Insecurity  . Worried About Charity fundraiser in the Last Year: Never true  . Ran Out of Food in the Last Year: Never true  Transportation Needs: No Transportation Needs  . Lack of Transportation (Medical): No  . Lack of Transportation (Non-Medical): No  Physical Activity: Sufficiently Active  . Days of Exercise per Week: 5 days  . Minutes of Exercise per Session: 30 min  Stress: No Stress Concern Present  . Feeling of Stress : Not at all  Social Connections: Slightly Isolated  . Frequency of Communication with Friends and Family: More than three times a week  . Frequency of Social Gatherings with Friends and Family: More than three times a week  . Attends Religious Services: More than 4 times per year  . Active Member of Clubs or Organizations: Yes  . Attends Archivist Meetings: More than 4 times per year  . Marital Status: Widowed     Family History:  The patient's family history includes Hypertension in her child.   Review of Systems:   Please see the history of present illness.      General:  No chills, fever, night sweats or weight changes. Positive for fatigue.  Cardiovascular:  No chest pain, dyspnea on exertion, edema, orthopnea, palpitations, paroxysmal nocturnal dyspnea. Dermatological: No rash, lesions/masses Respiratory: No cough, dyspnea Urologic: No hematuria, dysuria Abdominal:   No nausea, vomiting, diarrhea, bright red blood per rectum, melena, or hematemesis Neurologic:  No visual changes, wkns, changes in mental status. All other systems reviewed and are otherwise negative except as noted above.   Physical Exam:    VS:  BP 118/64   Pulse 62   Temp (!) 97.2 F (36.2 C)   Ht 5' 4.5" (1.638 m)   Wt 134 lb (60.8 kg)   SpO2 95%   BMI 22.65 kg/m    General: Well developed, well nourished,female appearing in no acute distress. Head: Normocephalic, atraumatic, sclera non-icteric.  Neck: No carotid bruits. JVD not elevated.  Lungs: Respirations regular and unlabored, without wheezes or rales.  Heart: Irregularly irregular. No S3 or S4.  No murmur, no rubs, or gallops appreciated. Abdomen: Soft, non-tender, non-distended. No obvious abdominal masses. Msk:  Strength and tone appear normal for age. No obvious joint deformities or effusions. Extremities: No clubbing or cyanosis. No lower extremity edema.  Distal pedal pulses are 2+ bilaterally. Neuro: Alert and oriented X 3. Moves all extremities spontaneously. No focal deficits noted. Psych:  Responds to questions appropriately with a normal affect. Skin: No rashes or lesions noted  Wt Readings from Last 3 Encounters:  09/26/19 134 lb (60.8 kg)  09/22/19 135 lb 3.2 oz (61.3 kg)  09/13/19 139 lb 8.8 oz (63.3 kg)     Studies/Labs Reviewed:   EKG:  EKG is ordered today. The ekg ordered today demonstrates rate-controlled atrial fibrillation, HR 66 with PVC's.   Recent Labs: 09/10/2019: ALT 25; B Natriuretic Peptide 1,555.0; Hemoglobin 10.5; Magnesium 2.1; Platelets 274; TSH 1.877 09/22/2019: BUN 24;  Creat 1.15; Potassium 4.2; Sodium 140   Lipid Panel    Component Value Date/Time   CHOL 186 03/08/2018 0000   TRIG 83 03/08/2018 0000   HDL 63 03/08/2018 0000   LDLCALC 106 03/08/2018 0000    Additional studies/ records that were reviewed today include:   Echocardiogram: 03/2019 IMPRESSIONS    1. Left ventricular  ejection fraction, by visual estimation, is 70 to  75%. The left ventricle has hyperdynamic function. Decreased left  ventricular size. There is moderately increased left ventricular  hypertrophy.  2. Left ventricular diastolic Doppler parameters are indeterminate  pattern of LV diastolic filling.  3. Right ventricular volume and pressure overload.  4. Global right ventricle has normal systolic function.The right  ventricular size is moderately enlarged. Mildly increased right  ventricular wall thickness.  5. Left atrial size was severely dilated.  6. Right atrial size was normal.  7. Presence of pericardial fat pad.  8. Moderate aortic valve annular calcification.  9. Moderate to severe mitral annular calcification.  10. Severe calcification of the mitral valve leaflet(s). Valve appears  stenotic, not completely evaluated. Suggest follow-up images with VTI  measurements or planimetry if possible.  11. The mitral valve is degenerative. Mild mitral valve regurgitation.  12. The tricuspid valve is grossly normal. Tricuspid valve regurgitation  is mild.  13. Aortic valve area, by VTI measures 1.83 cm.  14. Aortic valve mean gradient measures 8.8 mmHg.  15. The aortic valve is tricuspid Aortic valve regurgitation was not  visualized by color flow Doppler. Mild aortic valve stenosis.  16. Moderately elevated pulmonary artery systolic pressure.  17. The tricuspid regurgitant velocity is 3.67 m/s, and with an assumed  right atrial pressure of 8 mmHg, the estimated right ventricular systolic  pressure is moderately elevated at 61.9 mmHg.  18. The inferior vena  cava is normal in size with <50% respiratory  variability, suggesting right atrial pressure of 8 mmHg.  19. The pulmonic valve was grossly normal. Pulmonic valve regurgitation is  trivial by color flow Doppler.   Assessment:    1. Persistent atrial fibrillation ([place])   2. Chronic diastolic congestive heart failure (H[facility])   3. Valvular heart disease      Plan:   In order of problems listed above:  1. Persistent Atrial Fibrillation - A rate control strategy had initially been pursued given her overall asymptomatic state and severe left atrial dilation by most recent echocardiogram. Given her recurrent hospitalizations for CHF exacerbations, she was started on Amiodarone during her last admission. TSH and LFT's WNL during her hospitalization.  - She is still in atrial fibrillation by examination and EKG today but heart rate has improved into the 60's. She is currently on Amiodarone 200 mg twice daily. Will plan to reduce to 200 mg daily on 10/13/2019 as outlined during her hospitalization. - Continue Cardizem CD 180 mg daily and Lopressor 25 mg twice daily. I did encourage her to monitor her heart rate at home in case she does convert to NSR. Even if she remains in normal sinus rhythm, Amiodarone is helping with her heart rate and titration of AV nodal blocking agents was previously limited given her soft BP. - Continue Eliquis 5 mg twice daily for anticoagulation.  2.  Chronic Diastolic CHF - She denies any recent orthopnea, PND or lower extremity edema. Weight has been stable when checked at home. She remains on Torsemide 10 mg daily with instructions to take an extra half tablet as needed for weight gain or edema. Creatinine was stable at 1.15 by most recent labs with K+ at 4.2.  3.  Valvular Heart Disease - Most recent echocardiogram imaging showed mild mitral stenosis and mild aortic stenosis. Continue to follow.   Medication Adjustments/Labs and Tests Ordered: Current medicines are  reviewed at length with the patient today.  Concerns regarding medicines are outlined above.  Medication changes, Labs and Tests ordered today are listed in the Patient Instructions below. Patient Instructions  Medication Instructions:  Your physician has recommended you make the following change in your medication:  Decrease Amiodarone to 200 mg Daily on 10-13-19  *If you need a refill on your cardiac medications before your next appointment, please call your pharmacy*   Lab Work: NONE   If you have labs (blood work) drawn today and your tests are completely normal, you will receive your results only by: Marland Kitchen MyChart Message (if you have MyChart) OR . A paper copy in the mail If you have any lab test that is abnormal or we need to change your treatment, we will call you to review the results.   Testing/Procedures: NONE    Follow-Up: At [facility], you and your health needs are our priority.  As part of our continuing mission to provide you with exceptional heart care, we have created designated Provider Care Teams.  These Care Teams include your primary Cardiologist (physician) and Advanced Practice Providers (APPs -  Physician Assistants and Nurse Practitioners) who all work together to provide you with the care you need, when you need it.  We recommend signing up for the patient portal called "MyChart".  Sign up information is provided on this After Visit Summary.  MyChart is used to connect with patients for Virtual Visits (Telemedicine).  Patients are able to view lab/test results, encounter notes, upcoming appointments, etc.  Non-urgent messages can be sent to your provider as well.   To learn more about what you can do with MyChart, go to [email or web address].    Your next appointment:   2-3 month(s)  The format for your next appointment:   In Person  Provider:   Dr. Domenic Polite    Other Instructions Thank you for choosing [facility]!    Signed, TRINESHA HAGHIGHI, PA-C  09/26/2019 3:33 PM    [facility] [address] [place], [place] [address] Phone: [phone] Fax: [phone]

## 2019-10-02 ENCOUNTER — Other Ambulatory Visit: Payer: Self-pay

## 2019-10-02 ENCOUNTER — Telehealth: Payer: Self-pay

## 2019-10-02 ENCOUNTER — Other Ambulatory Visit: Payer: PPO

## 2019-10-02 NOTE — Telephone Encounter (Signed)
SW contacted patient to confirm visit for today, patient was not home and said she was going to be out for the day. Patient requested to reschedule visit for Monday, 3/29 at [facility].

## 2019-10-06 ENCOUNTER — Other Ambulatory Visit: Payer: PPO

## 2019-10-06 ENCOUNTER — Other Ambulatory Visit: Payer: Self-pay

## 2019-10-06 DIAGNOSIS — Z515 Encounter for palliative care: Secondary | ICD-10-CM

## 2019-10-06 NOTE — Progress Notes (Signed)
COMMUNITY PALLIATIVE CARE SW NOTE  PATIENT NAME: Kristy Mckinney DOB: 09-06-1931 MRN: [id number]  PRIMARY CARE PROVIDER: [name], MD  RESPONSIBLE PARTY:  Acct ID - Guarantor Home Phone Work Phone Relationship Acct Type  [id number] Webb [phone]  Self P/F     [address], [place], [place] [address]     PLAN OF CARE and INTERVENTIONS:             1. GOALS OF CARE/ ADVANCE CARE PLANNING:  Patient is a FULL CODE.Patient has a MOST form completed.HCPOA is daughter, [name]. Goal is towork with doctors to continue to adjust medications andavoid hospitalizations. 2. SOCIAL/EMOTIONAL/SPIRITUAL ASSESSMENT/ INTERVENTIONS:  SW completed TELEHEALTH visit with patient. Discussed hospitalization earlier this month. Patient said she was experiencing increased swelling and weakness, and SOB. Patient said they did pleural effusion removed 1.2 L. Patient reports that she is frustrated with having to go to the hospital. Patient said she is weaker, and feels that her feet are swollen again. Patient said she weighed 136.8lbs this morning, but weighed 135.6lbs last night. Patient noted she is trying to focus on maintaining a good appetite but struggles with this. Discussed medications and patient's concerns. Patient denies any falls. Patient noted ongoing back pain, this is chronic. Patient said she is sleeping well but getting up 3-4 times to go to the bathroom. Patient is worried with the swelling, but does have her feet propped up. SW encouraged patient to call cardiologist to discuss. SW provided emotional support, validated concerns, and used active and reflective listening.SW to follow-up again tomorrow at patient's request. 3. PATIENT/CAREGIVER EDUCATION/ COPING:  Patient was alert, engaged. Patient was pleasant.  Patient enjoyed time with family this weekend, celebrating grandchildren birthdays. Patient continues to see her long-time friend weekly and appreciates his company. Patient's  family and friends are very supportive.  4. PERSONAL EMERGENCY PLAN:  Family will call 9-1-1 for emergencies. Patient wears an alert necklace that she can push for staff at the apartments for emergencies. 5. COMMUNITY RESOURCES COORDINATION/ HEALTH CARE NAVIGATION:  Patient is managing her care. Patient is going to contact cardiologist. Patient is scheduled to see PCP on 4/12. 6. FINANCIAL/LEGAL CONCERNS/INTERVENTIONS:  None.     SOCIAL HX:  Social History   Tobacco Use  . Smoking status: Never Smoker  . Smokeless tobacco: Never Used  Substance Use Topics  . Alcohol use: No    CODE STATUS:   Code Status: Prior (FULL CODE) ADVANCED DIRECTIVES: Y MOST FORM COMPLETE:  Yes. HOSPICE EDUCATION PROVIDED: None.  PPS: Patient is independent ofall mostADLs.  Due to the COVID-19 , this visit was done via telephone from my office and it was initiated and consent by this patient and/or family. This was a scheduled visit.  I spent51minutes with patient/family, [id number] education, support and consultation.  Margaretmary Lombard, LCSW

## 2019-10-08 ENCOUNTER — Other Ambulatory Visit: Payer: Self-pay | Admitting: Family Medicine

## 2019-10-08 ENCOUNTER — Telehealth: Payer: Self-pay

## 2019-10-08 NOTE — Telephone Encounter (Signed)
SW contacted patient to check-in. Patient reports that she is "much better" today. Patient said she was having a bad day on Monday and was not feeling good. Patient said that the PT came by yesterday and helped educate patient on the proper position for her to reduce swelling in legs and that it is almost completely gone. Patient denies any concerns at this time. Patient appreciates phone call. Patient in agreement with home visit on 10-15-19 at 10:00AM.

## 2019-10-09 DIAGNOSIS — M810 Age-related osteoporosis without current pathological fracture: Secondary | ICD-10-CM | POA: Diagnosis not present

## 2019-10-09 DIAGNOSIS — J90 Pleural effusion, not elsewhere classified: Secondary | ICD-10-CM | POA: Diagnosis not present

## 2019-10-09 DIAGNOSIS — G8929 Other chronic pain: Secondary | ICD-10-CM | POA: Diagnosis not present

## 2019-10-09 DIAGNOSIS — J40 Bronchitis, not specified as acute or chronic: Secondary | ICD-10-CM | POA: Diagnosis not present

## 2019-10-09 DIAGNOSIS — M1990 Unspecified osteoarthritis, unspecified site: Secondary | ICD-10-CM | POA: Diagnosis not present

## 2019-10-09 DIAGNOSIS — I4819 Other persistent atrial fibrillation: Secondary | ICD-10-CM | POA: Diagnosis not present

## 2019-10-09 DIAGNOSIS — R1310 Dysphagia, unspecified: Secondary | ICD-10-CM | POA: Diagnosis not present

## 2019-10-09 DIAGNOSIS — I5033 Acute on chronic diastolic (congestive) heart failure: Secondary | ICD-10-CM | POA: Diagnosis not present

## 2019-10-09 DIAGNOSIS — I110 Hypertensive heart disease with heart failure: Secondary | ICD-10-CM | POA: Diagnosis not present

## 2019-10-09 DIAGNOSIS — E559 Vitamin D deficiency, unspecified: Secondary | ICD-10-CM | POA: Diagnosis not present

## 2019-10-09 DIAGNOSIS — M26602 Left temporomandibular joint disorder, unspecified: Secondary | ICD-10-CM | POA: Diagnosis not present

## 2019-10-09 DIAGNOSIS — M545 Low back pain: Secondary | ICD-10-CM | POA: Diagnosis not present

## 2019-10-09 DIAGNOSIS — K219 Gastro-esophageal reflux disease without esophagitis: Secondary | ICD-10-CM | POA: Diagnosis not present

## 2019-10-12 ENCOUNTER — Emergency Department (HOSPITAL_COMMUNITY): Payer: PPO

## 2019-10-12 ENCOUNTER — Other Ambulatory Visit: Payer: Self-pay

## 2019-10-12 ENCOUNTER — Inpatient Hospital Stay (HOSPITAL_COMMUNITY)
Admission: EM | Admit: 2019-10-12 | Discharge: 2019-10-14 | DRG: 186 | Disposition: A | Discharge status: Home Health | Payer: PPO | Attending: Family Medicine | Admitting: Family Medicine

## 2019-10-12 DIAGNOSIS — Z20822 Contact with and (suspected) exposure to covid-19: Secondary | ICD-10-CM | POA: Diagnosis present

## 2019-10-12 DIAGNOSIS — Z888 Allergy status to other drugs, medicaments and biological substances status: Secondary | ICD-10-CM | POA: Diagnosis not present

## 2019-10-12 DIAGNOSIS — J90 Pleural effusion, not elsewhere classified: Secondary | ICD-10-CM | POA: Diagnosis not present

## 2019-10-12 DIAGNOSIS — I110 Hypertensive heart disease with heart failure: Secondary | ICD-10-CM | POA: Diagnosis not present

## 2019-10-12 DIAGNOSIS — J9601 Acute respiratory failure with hypoxia: Secondary | ICD-10-CM | POA: Diagnosis not present

## 2019-10-12 DIAGNOSIS — I5033 Acute on chronic diastolic (congestive) heart failure: Secondary | ICD-10-CM | POA: Diagnosis not present

## 2019-10-12 DIAGNOSIS — R0902 Hypoxemia: Secondary | ICD-10-CM

## 2019-10-12 DIAGNOSIS — I4821 Permanent atrial fibrillation: Secondary | ICD-10-CM | POA: Diagnosis not present

## 2019-10-12 DIAGNOSIS — I4891 Unspecified atrial fibrillation: Secondary | ICD-10-CM

## 2019-10-12 DIAGNOSIS — Z90710 Acquired absence of both cervix and uterus: Secondary | ICD-10-CM | POA: Diagnosis not present

## 2019-10-12 DIAGNOSIS — Z880 Allergy status to penicillin: Secondary | ICD-10-CM | POA: Diagnosis not present

## 2019-10-12 DIAGNOSIS — Z881 Allergy status to other antibiotic agents status: Secondary | ICD-10-CM

## 2019-10-12 DIAGNOSIS — Z79899 Other long term (current) drug therapy: Secondary | ICD-10-CM

## 2019-10-12 DIAGNOSIS — Z9889 Other specified postprocedural states: Secondary | ICD-10-CM

## 2019-10-12 DIAGNOSIS — I5031 Acute diastolic (congestive) heart failure: Secondary | ICD-10-CM | POA: Diagnosis not present

## 2019-10-12 DIAGNOSIS — R0602 Shortness of breath: Secondary | ICD-10-CM | POA: Diagnosis not present

## 2019-10-12 DIAGNOSIS — R05 Cough: Secondary | ICD-10-CM | POA: Diagnosis not present

## 2019-10-12 DIAGNOSIS — J9600 Acute respiratory failure, unspecified whether with hypoxia or hypercapnia: Secondary | ICD-10-CM | POA: Diagnosis present

## 2019-10-12 DIAGNOSIS — M545 Low back pain: Secondary | ICD-10-CM | POA: Diagnosis not present

## 2019-10-12 DIAGNOSIS — E559 Vitamin D deficiency, unspecified: Secondary | ICD-10-CM | POA: Diagnosis present

## 2019-10-12 DIAGNOSIS — K2100 Gastro-esophageal reflux disease with esophagitis, without bleeding: Secondary | ICD-10-CM | POA: Diagnosis not present

## 2019-10-12 DIAGNOSIS — Z886 Allergy status to analgesic agent status: Secondary | ICD-10-CM | POA: Diagnosis not present

## 2019-10-12 DIAGNOSIS — M810 Age-related osteoporosis without current pathological fracture: Secondary | ICD-10-CM | POA: Diagnosis not present

## 2019-10-12 DIAGNOSIS — E876 Hypokalemia: Secondary | ICD-10-CM | POA: Diagnosis not present

## 2019-10-12 DIAGNOSIS — R079 Chest pain, unspecified: Secondary | ICD-10-CM | POA: Diagnosis not present

## 2019-10-12 DIAGNOSIS — I482 Chronic atrial fibrillation, unspecified: Secondary | ICD-10-CM | POA: Diagnosis not present

## 2019-10-12 DIAGNOSIS — R0781 Pleurodynia: Secondary | ICD-10-CM | POA: Diagnosis present

## 2019-10-12 DIAGNOSIS — F329 Major depressive disorder, single episode, unspecified: Secondary | ICD-10-CM | POA: Diagnosis not present

## 2019-10-12 DIAGNOSIS — G8929 Other chronic pain: Secondary | ICD-10-CM | POA: Diagnosis present

## 2019-10-12 DIAGNOSIS — F419 Anxiety disorder, unspecified: Secondary | ICD-10-CM | POA: Diagnosis not present

## 2019-10-12 DIAGNOSIS — F0390 Unspecified dementia without behavioral disturbance: Secondary | ICD-10-CM | POA: Diagnosis present

## 2019-10-12 DIAGNOSIS — Z882 Allergy status to sulfonamides status: Secondary | ICD-10-CM

## 2019-10-12 DIAGNOSIS — I509 Heart failure, unspecified: Secondary | ICD-10-CM

## 2019-10-12 DIAGNOSIS — Z7901 Long term (current) use of anticoagulants: Secondary | ICD-10-CM

## 2019-10-12 LAB — BASIC METABOLIC PANEL
Anion gap: 11 (ref 5–15)
BUN: 28 mg/dL — ABNORMAL HIGH (ref 8–23)
CO2: 31 mmol/L (ref 22–32)
Calcium: 9.8 mg/dL (ref 8.9–10.3)
Chloride: 97 mmol/L — ABNORMAL LOW (ref 98–111)
Creatinine, Ser: 1.08 mg/dL — ABNORMAL HIGH (ref 0.44–1.00)
GFR calc Af Amer: 54 mL/min — ABNORMAL LOW (ref >60)
GFR calc non Af Amer: 47 mL/min — ABNORMAL LOW (ref >60)
Glucose, Bld: 112 mg/dL — ABNORMAL HIGH (ref 70–99)
Potassium: 3.2 mmol/L — ABNORMAL LOW (ref 3.5–5.1)
Sodium: 139 mmol/L (ref 135–145)

## 2019-10-12 LAB — CBC
HCT: 35.3 % — ABNORMAL LOW (ref 36.0–46.0)
Hemoglobin: 10.2 g/dL — ABNORMAL LOW (ref 12.0–15.0)
MCH: 23.9 pg — ABNORMAL LOW (ref 26.0–34.0)
MCHC: 28.9 g/dL — ABNORMAL LOW (ref 30.0–36.0)
MCV: 82.7 fL (ref 80.0–100.0)
Platelets: 288 10*3/uL (ref 150–400)
RBC: 4.27 MIL/uL (ref 3.87–5.11)
RDW: 16.7 % — ABNORMAL HIGH (ref 11.5–15.5)
WBC: 8.8 10*3/uL (ref 4.0–10.5)
nRBC: 0 % (ref 0.0–0.2)

## 2019-10-12 LAB — PROTIME-INR
INR: 1.5 — ABNORMAL HIGH (ref 0.8–1.2)
Prothrombin Time: 17.8 seconds — ABNORMAL HIGH (ref 11.4–15.2)

## 2019-10-12 LAB — RESPIRATORY PANEL BY RT PCR (FLU A&B, COVID)
Influenza A by PCR: NEGATIVE
Influenza B by PCR: NEGATIVE
SARS Coronavirus 2 by RT PCR: NEGATIVE

## 2019-10-12 LAB — BRAIN NATRIURETIC PEPTIDE: B Natriuretic Peptide: 915 pg/mL — ABNORMAL HIGH (ref 0.0–100.0)

## 2019-10-12 LAB — MAGNESIUM: Magnesium: 2.2 mg/dL (ref 1.7–2.4)

## 2019-10-12 LAB — TROPONIN I (HIGH SENSITIVITY)
Troponin I (High Sensitivity): 9 ng/L (ref <18)
Troponin I (High Sensitivity): 10 ng/L (ref <18)

## 2019-10-12 MED ORDER — POTASSIUM CHLORIDE CRYS ER 20 MEQ PO TBCR
20.0000 meq | EXTENDED_RELEASE_TABLET | Freq: Once | ORAL | Status: DC
  Filled 2019-10-12 (×2): qty 1

## 2019-10-12 MED ORDER — POTASSIUM CHLORIDE CRYS ER 20 MEQ PO TBCR
40.0000 meq | EXTENDED_RELEASE_TABLET | Freq: Once | ORAL | Status: AC
  Administered 2019-10-13: 40 meq via ORAL
  Filled 2019-10-12: qty 2

## 2019-10-12 MED ORDER — FUROSEMIDE 10 MG/ML IJ SOLN
40.0000 mg | Freq: Once | INTRAMUSCULAR | Status: AC
  Administered 2019-10-12: 40 mg via INTRAVENOUS
  Filled 2019-10-12: qty 4

## 2019-10-12 MED ORDER — POLYETHYLENE GLYCOL 3350 17 G PO PACK
17.0000 g | PACK | Freq: Every day | ORAL | Status: DC | PRN
  Administered 2019-10-14: 17 g via ORAL
  Filled 2019-10-12: qty 1

## 2019-10-12 MED ORDER — FUROSEMIDE 10 MG/ML IJ SOLN
40.0000 mg | Freq: Once | INTRAMUSCULAR | Status: DC
  Filled 2019-10-12: qty 4

## 2019-10-12 MED ORDER — ONDANSETRON HCL 4 MG PO TABS: 4.0000 mg | ORAL_TABLET | Freq: Four times a day (QID) | ORAL | Status: DC | PRN

## 2019-10-12 MED ORDER — POTASSIUM CHLORIDE 20 MEQ PO PACK: 40.0000 meq | PACK | Freq: Once | ORAL | Status: DC

## 2019-10-12 MED ORDER — METOPROLOL TARTRATE 5 MG/5ML IV SOLN
5.0000 mg | Freq: Once | INTRAVENOUS | Status: AC
  Administered 2019-10-12: 5 mg via INTRAVENOUS
  Filled 2019-10-12: qty 5

## 2019-10-12 MED ORDER — MEMANTINE HCL 10 MG PO TABS
5.0000 mg | ORAL_TABLET | Freq: Two times a day (BID) | ORAL | Status: DC
  Administered 2019-10-13 – 2019-10-14 (×4): 5 mg via ORAL
  Filled 2019-10-12 (×4): qty 1

## 2019-10-12 MED ORDER — METOPROLOL TARTRATE 25 MG PO TABS
25.0000 mg | ORAL_TABLET | Freq: Two times a day (BID) | ORAL | Status: DC
  Administered 2019-10-12 – 2019-10-13 (×2): 25 mg via ORAL
  Filled 2019-10-12 (×4): qty 1

## 2019-10-12 MED ORDER — ACETAMINOPHEN 650 MG RE SUPP: 650.0000 mg | Freq: Four times a day (QID) | RECTAL | Status: DC | PRN

## 2019-10-12 MED ORDER — METOPROLOL TARTRATE 5 MG/5ML IV SOLN: 5.0000 mg | INTRAVENOUS | Status: DC | PRN

## 2019-10-12 MED ORDER — QUETIAPINE FUMARATE 25 MG PO TABS
25.0000 mg | ORAL_TABLET | Freq: Every day | ORAL | Status: DC
  Administered 2019-10-13 (×2): 25 mg via ORAL
  Filled 2019-10-12 (×2): qty 1

## 2019-10-12 MED ORDER — ACETAMINOPHEN 325 MG PO TABS: 650.0000 mg | ORAL_TABLET | Freq: Four times a day (QID) | ORAL | Status: DC | PRN

## 2019-10-12 MED ORDER — DILTIAZEM HCL ER COATED BEADS 180 MG PO CP24
180.0000 mg | ORAL_CAPSULE | Freq: Every day | ORAL | Status: DC
  Administered 2019-10-13: 180 mg via ORAL
  Filled 2019-10-12 (×2): qty 1

## 2019-10-12 MED ORDER — TORSEMIDE 20 MG PO TABS
10.0000 mg | ORAL_TABLET | Freq: Every day | ORAL | Status: DC
  Administered 2019-10-13: 10 mg via ORAL
  Filled 2019-10-12: qty 1

## 2019-10-12 MED ORDER — AMIODARONE HCL 200 MG PO TABS
200.0000 mg | ORAL_TABLET | Freq: Every day | ORAL | Status: DC
  Administered 2019-10-13 – 2019-10-14 (×2): 200 mg via ORAL
  Filled 2019-10-12 (×2): qty 1

## 2019-10-12 MED ORDER — PANTOPRAZOLE SODIUM 40 MG PO TBEC
40.0000 mg | DELAYED_RELEASE_TABLET | Freq: Every day | ORAL | Status: DC
  Administered 2019-10-13 – 2019-10-14 (×2): 40 mg via ORAL
  Filled 2019-10-12 (×2): qty 1

## 2019-10-12 MED ORDER — ONDANSETRON HCL 4 MG/2ML IJ SOLN: 4.0000 mg | Freq: Four times a day (QID) | INTRAMUSCULAR | Status: DC | PRN

## 2019-10-12 NOTE — ED Triage Notes (Addendum)
Patient states shortness of breath and is complaining of chest pain upon inhalation. Patient's oxygen is low at 88 percent on room air. Patient's heart rate is fluctuating from 90-150. Patient has a hx of atrial-fib.

## 2019-10-12 NOTE — H&P (Addendum)
History and Physical    Kristy Mckinney [id number] DOB: 1932/03/29 DOA: 10/12/2019  PCP: [name], MD   Patient coming from: Home  I have personally briefly reviewed patient's old medical records in [place]  Chief Complaint: Chest pain, difficulty breathing  HPI: Kristy Mckinney is a 86 y.o. female with medical history significant for atrial fibrillation with RVR, diastolic CHF, hypertension. Patient presented to the ED with complaints of difficulty breathing, and chest pain started yesterday.  Chest pain is right-sided and present only when she takes a deep breath in.  She reports some mild chronic cough productive of clear sputum-improved compared to prior.  She reports slight fluctuation in weight, but less than 3 pounds, since discharge from the hospital about 2 months ago.  She denies increasing lower extremity swelling.  She reports compliance with her medications torsemide, Eliquis, amiodarone, metoprolol and Cardizem. Patient is not on home O2.  Recent hospitalization on 3/3-3/6 for acute respiratory failure in the setting of atrial fibrillation with RVR flash pulmonary edema, mild acute on chronic CHF.  She required thoracentesis with 1.2 L of fluid removed.  Discharged home on amiodarone 200 twice a day for 1 month.    She is to start 200 mg of amiodarone once a day today.  ED Course: Heart rate fluctuating from upper 90s to 120s.  O2 sats 88% on room air, placed on nasal cannula 4 L.  Blood pressure 130s to 140s.  BNP elevated 915, troponin- 9.  EKG shows atrial fibrillation rate 99.  Does recommendation of moderate-sized pleural effusion.  WBC 8.8. Hospitalist to admit for further evaluation and management.  Review of Systems: As per HPI all other systems reviewed and negative.  Past Medical History:  Diagnosis Date  . Abdominal distension (gaseous)   . Acute on chronic diastolic heart failure ([place])   . Age-related osteoporosis without current  pathological fracture   . Anemia, unspecified   . Anxiety disorder, unspecified   . Atrial fibrillation ([place])   . Bronchitis, not specified as acute or chronic   . Chronic back pain   . Diarrhea   . Disorder of the skin and subcutaneous tissue, unspecified   . Dysphagia 05/03/2016  . Essential (primary) hypertension   . Gastro-esophageal reflux disease with esophagitis   . GERD (gastroesophageal reflux disease)   . Iron deficiency anemia, unspecified   . Irritable bowel syndrome with diarrhea   . Left temporomandibular joint disorder, unspecified   . Lumbago   . Pneumonia   . Shortness of breath   . Unspecified dementia without behavioral disturbance ([place])   . Unspecified fracture of unspecified wrist and hand, initial encounter for closed fracture   . Vitamin D deficiency, unspecified     Past Surgical History:  Procedure Laterality Date  . ABDOMINAL HYSTERECTOMY    . BACK SURGERY    . CHOLECYSTECTOMY N/A 01/29/2014   Procedure: LAPAROSCOPIC CHOLECYSTECTOMY;  Surgeon: Scherry Ran, MD;  Location: [facility];  Service: General;  Laterality: N/A;  . COLONOSCOPY N/A 10/27/2015   Procedure: COLONOSCOPY;  Surgeon: Rogene Houston, MD;  Location: [facility];  Service: Endoscopy;  Laterality: N/A;  215  . INTRAMEDULLARY (IM) NAIL INTERTROCHANTERIC Right 11/29/2018   Procedure: INTRAMEDULLARY (IM) NAIL INTERTROCHANTRIC FRACTURE;  Surgeon: Renette Butters, MD;  Location: [facility];  Service: Orthopedics;  Laterality: Right;  . TONSILLECTOMY    . YAG LASER APPLICATION Left [id number]   Procedure: YAG LASER APPLICATION;  Surgeon: Elta Guadeloupe T.  Gershon Crane, MD;  Location: [facility];  Service: Ophthalmology;  Laterality: Left;  left     reports that she has never smoked. She has never used smokeless tobacco. She reports that she does not drink alcohol or use drugs.  Allergies  Allergen Reactions  . Penicillins Anaphylaxis    Did it involve swelling of the face/tongue/throat, SOB, or low BP?  Unknown Did it involve sudden or severe rash/hives, skin peeling, or any reaction on the inside of your mouth or nose? Unknown Did you need to seek medical attention at a hospital or doctor's office? Unknown When did it last happen? If all above answers are "NO", may proceed with cephalosporin use.   . Biaxin [Clarithromycin]   . Ciprofloxacin Nausea And Vomiting  . Emetrol Nausea Only    swelling  . Feldene [Piroxicam] Nausea And Vomiting  . Sulfa Antibiotics     swelling    Family History  Problem Relation Age of Onset  . Hypertension Child     Prior to Admission medications   Medication Sig Start Date End Date Taking? Authorizing Provider  acetaminophen (TYLENOL) 325 MG tablet Take 650 mg by mouth every 6 (six) hours as needed. 12/05/18  Yes [provider]  alendronate (FOSAMAX) 70 MG tablet Take 1 tablet (70 mg total) by mouth once a week. Take with a full glass of water on an empty stomach. 12/23/18  Yes Gerlene Fee, NP  amiodarone (PACERONE) 200 MG tablet Take 1 tablet (200 mg total) by mouth 2 (two) times daily. Planning for 200mg  BID for 1 month and then 200 mg daily, as per cardiology recommendations. 09/26/19  Yes Pesola, [place] M, PA-C  apixaban (ELIQUIS) 5 MG TABS tablet Take 1 tablet (5 mg total) by mouth 2 (two) times daily. 04/07/19  Yes [name], MD  benzonatate (TESSALON PERLES) 100 MG capsule Take 2 capsules (200 mg total) by mouth every 8 (eight) hours as needed (severe coughing spells). 09/13/19 09/12/20 Yes Barton Dubois, MD  calcium-vitamin D (OS-CAL 500 + D) 500-200 MG-UNIT tablet Take 1 tablet by mouth 2 (two) times daily. 12/05/18  Yes Oretha Milch D, MD  diltiazem (CARDIZEM CD) 180 MG 24 hr capsule Take 1 capsule (180 mg total) by mouth daily. 09/13/19 09/12/20 Yes Barton Dubois, MD  magnesium oxide (MAG-OX) 400 MG tablet Take 1 tablet (400 mg total) by mouth 3 (three) times daily. 12/23/18  Yes Gerlene Fee, NP  memantine (NAMENDA) 5  MG tablet Take 1 tablet (5 mg total) by mouth 2 (two) times daily. 12/23/18  Yes Gerlene Fee, NP  metoprolol tartrate (LOPRESSOR) 25 MG tablet Take 1 tablet (25 mg total) by mouth 2 (two) times daily. 04/08/19  Yes [name], MD  pantoprazole (PROTONIX) 40 MG tablet TAKE (1) TABLET BY MOUTH DAILY. 10/08/19  Yes Corum, Rex Kras, MD  Polyethyl Glycol-Propyl Glycol (SYSTANE OP) Place 1 drop into both eyes daily as needed. Dry Eyes    Yes [provider]  QUEtiapine (SEROQUEL) 25 MG tablet Take 1 tablet (25 mg total) by mouth at bedtime. 12/23/18  Yes Gerlene Fee, NP  torsemide (DEMADEX) 20 MG tablet Take 1/2   tablet by mouth daily.  If your weight goes up as much as 3 pounds take 2 daily 05/27/19  Yes Tomberlin, [place] M, PA-C  sertraline (ZOLOFT) 50 MG tablet Take 1 tablet (50 mg total) by mouth every morning. 12/23/18   Gerlene Fee, NP    Physical Exam: Vitals:  10/12/19 1925 10/12/19 1926 10/12/19 2000 10/12/19 2030  BP: (!) 142/93  139/74 (!) 149/91  Pulse: (!) 120   95  Resp: 20  (!) 27   Temp: 98.1 F (36.7 C)     TempSrc: Oral     SpO2: (!) 88%   95%  Weight:  60.3 kg    Height:  5' 4.5" (1.638 m)      Constitutional: NAD, calm, comfortable Vitals:   10/12/19 1925 10/12/19 1926 10/12/19 2000 10/12/19 2030  BP: (!) 142/93  139/74 (!) 149/91  Pulse: (!) 120   95  Resp: 20  (!) 27   Temp: 98.1 F (36.7 C)     TempSrc: Oral     SpO2: (!) 88%   95%  Weight:  60.3 kg    Height:  5' 4.5" (1.638 m)     Eyes: PERRL, lids and conjunctivae normal ENMT: Mucous membranes are moist.  Neck: normal, supple, no masses, no thyromegaly Respiratory: clear to auscultation bilaterally, no wheezing,. Normal respiratory effort. No accessory muscle use.  Cardiovascular: Irregular rate and rhythm, tachycardic, no murmurs / rubs / gallops.  Trace bilateral extremity edema. 2+ pedal pulses.  Abdomen: no tenderness, no masses palpated. No hepatosplenomegaly. Bowel sounds  positive.  Musculoskeletal: no clubbing / cyanosis. No joint deformity upper and lower extremities. Good ROM, no contractures. Normal muscle tone.  Skin: no rashes, lesions, ulcers. No induration Neurologic: No apparent cranial abnormality, moving all extremities spontaneously. Psychiatric: Normal judgment and insight. Alert and oriented x 3. Normal mood.   Labs on Admission: I have personally reviewed following labs and imaging studies  CBC: Recent Labs  Lab 10/12/19 1942  WBC 8.8  HGB 10.2*  HCT 35.3*  MCV 82.7  PLT 123XX123   Basic Metabolic Panel: Recent Labs  Lab 10/12/19 1942  NA 139  K 3.2*  CL 97*  CO2 31  GLUCOSE 112*  BUN 28*  CREATININE 1.08*  CALCIUM 9.8   Coagulation Profile: Recent Labs  Lab 10/12/19 1942  INR 1.5*   Urine analysis:    Component Value Date/Time   COLORURINE YELLOW 09/10/2019 1248   APPEARANCEUR CLEAR 09/10/2019 1248   LABSPEC 1.018 09/10/2019 1248   PHURINE 6.0 09/10/2019 1248   GLUCOSEU NEGATIVE 09/10/2019 1248   HGBUR NEGATIVE 09/10/2019 [place] 09/10/2019 [place] 09/10/2019 1248   PROTEINUR 100 (A) 09/10/2019 1248   NITRITE NEGATIVE 09/10/2019 1248   LEUKOCYTESUR NEGATIVE 09/10/2019 1248    Radiological Exams on Admission: DG Chest Portable 1 View  Result Date: 10/12/2019 CLINICAL DATA:  Chest pain, shortness of breath. EXAM: PORTABLE CHEST 1 VIEW COMPARISON:  Chest x-ray dated 09/10/2019. FINDINGS: Increased opacity at the RIGHT lung base, presumably reaccumulation of pleural effusion. Patchy bilateral perihilar and LEFT basilar opacities are not significantly changed, persistent edema versus chronic interstitial lung disease/fibrosis. No pneumothorax is seen. Heart size and mediastinal contours appear stable. IMPRESSION: 1. Increased opacity at the RIGHT lung base, presumably reaccumulation of a moderate-sized pleural effusion. 2. Stable bilateral perihilar and LEFT basilar opacities,  persistent interstitial edema versus chronic interstitial lung disease/fibrosis. Electronically Signed   By: Franki Cabot M.D.   On: 10/12/2019 20:36    EKG: Independently reviewed.  Atrial fibrillation rate 99, LAFB.  No significant ST or T wave changes compared to prior..  QTC 498.  Assessment/Plan Principal Problem:   Acute respiratory failure (HCC) Active Problems:   Acute diastolic CHF (congestive heart failure) (HCC)   Atrial  fibrillation ([place])   Acute hypoxic respiratory failure- O2 sats 88% on room air currently on 4 L nasal cannula.  Likely secondary to reaccumulation of moderate pleural effusion. -Supplemental O2 -Thoracentesis in a.m.  Atrial fibrillation- rates fluctuating from 90-120.  Reports compliance with Cardizem, amiodarone (started 200 mg once daily dosing today), metoprolol and Eliquis.  Tachycardia likely secondary to reaccumulated moderate pleural effusion. -Resume home Eliquis -IV metoprolol 5 mg as needed -Resume metoprolol 25 mg twice daily, Cardizem, amiodarone 200 mg daily -Cardiology consulted.  Mild decompensated diastolic CHF- BNP elevated at 915, chest x-ray suggesting moderate pleural effusion.  Peripherally appears euvolemic.  Reports compliance with salt restriction, and torsemide.  last echo 03/2019 EF 70 to 75%, LV hypertrophy, indeterminate LV diastolic Doppler parameters.  Recent thoracentesis revealed at 1.2 L of fluid.  Fluid analysis suggested CHF.  Cytology was negative for malignant cells. -Ultrasound-guided thoracentesis in a.m - IV lasix 40 mg x 1 -Resume home torsemide 10 mg daily in a.m. - Strict input output -Daily weights -Daily BMP - Fluid restriction  Chest pain-right-sided, likely secondary to pleural effusion.  EKG is unchanged. Troponin- 9.  -Trend troponin  Prolonged QTC-498.  Potassium 3.2.  Also on SSRI- sertraline. -Check magnesium -EKG a.m.  Hypertension-stable. -Resume home Cardizem, metoprolol,  torsemide  Depression -Resume home sertraline  DVT prophylaxis: SCDs for now, pending thoracentesis. Code Status: Full code, confirmed with patient and daughter at bedside. Family Communication: Daughter at bedside. Disposition Plan: ~ 2 days, pending improvement in heart rate, respiratory failure. Consults called: Cardiology Admission status: Obs, telemetry I certify that at the point of admission it is my clinical judgment that the patient will require inpatient hospital care spanning beyond 2 midnights from the point of admission due to high intensity of service, high risk for further deterioration and high frequency of surveillance required.    Bethena Roys MD [facility]  10/12/2019, 10:12 PM

## 2019-10-12 NOTE — ED Provider Notes (Signed)
[facility] EMERGENCY DEPARTMENT Provider Note   CSN: [id number] Arrival date & time: 10/12/19  1905     History Chief Complaint  Patient presents with  . Shortness of Breath    Kristy Mckinney is a 86 y.o. female.  She has a history of A. fib on anticoagulation, congestive heart failure.  She is complaining of shortness of breath that began yesterday.  She also has pain with deep inspiration.  Denies any fevers chills cough abdominal pain vomiting diarrhea or urinary symptoms.  The history is provided by the patient.  Shortness of Breath Severity:  Moderate Onset quality:  Gradual Duration:  2 days Timing:  Intermittent Progression:  Worsening Chronicity:  Recurrent Relieved by:  Nothing Worsened by:  Activity Ineffective treatments:  Diuretics Associated symptoms: chest pain   Associated symptoms: no abdominal pain, no cough, no diaphoresis, no fever, no headaches, no hemoptysis, no neck pain, no rash, no sore throat, no sputum production, no syncope and no vomiting   Chest pain:    Quality: sharp     Severity:  Mild   Onset quality:  Gradual   Duration:  2 days   Timing:  Intermittent   Progression:  Unchanged   Chronicity:  Recurrent      Past Medical History:  Diagnosis Date  . Abdominal distension (gaseous)   . Acute on chronic diastolic heart failure ([place])   . Age-related osteoporosis without current pathological fracture   . Anemia, unspecified   . Anxiety disorder, unspecified   . Atrial fibrillation ([place])   . Bronchitis, not specified as acute or chronic   . Chronic back pain   . Diarrhea   . Disorder of the skin and subcutaneous tissue, unspecified   . Dysphagia 05/03/2016  . Essential (primary) hypertension   . Gastro-esophageal reflux disease with esophagitis   . GERD (gastroesophageal reflux disease)   . Iron deficiency anemia, unspecified   . Irritable bowel syndrome with diarrhea   . Left temporomandibular joint disorder, unspecified   .  Lumbago   . Pneumonia   . Shortness of breath   . Unspecified dementia without behavioral disturbance ([place])   . Unspecified fracture of unspecified wrist and hand, initial encounter for closed fracture   . Vitamin D deficiency, unspecified     Patient Active Problem List   Diagnosis Date Noted  . Elevated glucose 08/20/2019  . Degenerative lumbar disc 08/20/2019  . Left hip pain 08/20/2019  . Osteoporosis   . Anemia, unspecified   . Anxiety disorder, unspecified   . Vitamin D deficiency, unspecified   . Essential (primary) hypertension   . Abdominal distension (gaseous)   . Disorder of the skin and subcutaneous tissue, unspecified   . Gastro-esophageal reflux disease with esophagitis   . Iron deficiency anemia, unspecified   . Irritable bowel syndrome with diarrhea   . Left temporomandibular joint disorder, unspecified   . Unspecified dementia without behavioral disturbance ([place])   . Unspecified fracture of unspecified wrist and hand, initial encounter for closed fracture   . Advanced care planning/counseling discussion   . Acute on chronic diastolic congestive heart failure ([place]) 05/02/2019  . Acute on chronic diastolic CHF (congestive heart failure) ([place]) 05/02/2019  . Palliative care by specialist   . Goals of care, counseling/discussion   . Generalized weakness   . Pleural effusion 04/13/2019  . Pancreatitis, acute 04/13/2019  . Acute respiratory failure with hypoxia ([place]) 04/13/2019  . Bradycardia 04/10/2019  . Hypotension 04/10/2019  . Hyperkalemia 04/10/2019  .  ARF (acute renal failure) ([place]) 04/10/2019  . Atrial fibrillation ([place]) 04/06/2019  . A-fib ([place]) 04/04/2019  . Dementia ([place]) 12/12/2018  . GERD without esophagitis 12/06/2018  . Post-menopausal osteoporosis 12/06/2018  . Acute delirium 12/06/2018  . Hypomagnesemia 12/06/2018  . Acute blood loss anemia 12/06/2018  . Closed fracture of right femur, unspecified fracture morphology, initial encounter ([place])  11/28/2018  . Acute diastolic CHF (congestive heart failure) ([place]) 11/28/2018  . Fracture, intertrochanteric, right femur ([place]) 11/28/2018  . Atrial fibrillation with rapid ventricular response ([place]) 11/25/2018  . Atrial fibrillation with RVR ([place]) 11/24/2018  . Chronic back pain 11/24/2018  . Anxiety and depression 11/24/2018  . Chronic constipation 05/03/2016  . Dysphagia 05/03/2016  . Cholecystitis with cholelithiasis 01/29/2014    Past Surgical History:  Procedure Laterality Date  . ABDOMINAL HYSTERECTOMY    . BACK SURGERY    . CHOLECYSTECTOMY N/A 01/29/2014   Procedure: LAPAROSCOPIC CHOLECYSTECTOMY;  Surgeon: Scherry Ran, MD;  Location: [facility];  Service: General;  Laterality: N/A;  . COLONOSCOPY N/A 10/27/2015   Procedure: COLONOSCOPY;  Surgeon: Rogene Houston, MD;  Location: [facility];  Service: Endoscopy;  Laterality: N/A;  215  . INTRAMEDULLARY (IM) NAIL INTERTROCHANTERIC Right 11/29/2018   Procedure: INTRAMEDULLARY (IM) NAIL INTERTROCHANTRIC FRACTURE;  Surgeon: Renette Butters, MD;  Location: [facility];  Service: Orthopedics;  Laterality: Right;  . TONSILLECTOMY    . YAG LASER APPLICATION Left [id number]   Procedure: YAG LASER APPLICATION;  Surgeon: Elta Guadeloupe T. Gershon Crane, MD;  Location: [facility];  Service: Ophthalmology;  Laterality: Left;  left     OB History   No obstetric history on file.     Family History  Problem Relation Age of Onset  . Hypertension Child     Social History   Tobacco Use  . Smoking status: Never Smoker  . Smokeless tobacco: Never Used  Substance Use Topics  . Alcohol use: No  . Drug use: No    Home Medications Prior to Admission medications   Medication Sig Start Date End Date Taking? Authorizing Provider  acetaminophen (TYLENOL) 325 MG tablet Take 650 mg by mouth every 6 (six) hours as needed. 12/05/18   [provider]  alendronate (FOSAMAX) 70 MG tablet Take 1 tablet (70 mg total) by mouth once a week. Take with a full  glass of water on an empty stomach. 12/23/18   Gerlene Fee, NP  amiodarone (PACERONE) 200 MG tablet Take 1 tablet (200 mg total) by mouth 2 (two) times daily. Planning for 200mg  BID for 1 month and then 200 mg daily, as per cardiology recommendations. 09/26/19   Vandeven, Fransisco Hertz, PA-C  apixaban (ELIQUIS) 5 MG TABS tablet Take 1 tablet (5 mg total) by mouth 2 (two) times daily. 04/07/19   [name], MD  benzonatate (TESSALON PERLES) 100 MG capsule Take 2 capsules (200 mg total) by mouth every 8 (eight) hours as needed (severe coughing spells). 09/13/19 09/12/20  Barton Dubois, MD  calcium-vitamin D (OS-CAL 500 + D) 500-200 MG-UNIT tablet Take 1 tablet by mouth 2 (two) times daily. 12/05/18   Desiree Hane, MD  diltiazem (CARDIZEM CD) 180 MG 24 hr capsule Take 1 capsule (180 mg total) by mouth daily. 09/13/19 09/12/20  Barton Dubois, MD  magnesium oxide (MAG-OX) 400 MG tablet Take 1 tablet (400 mg total) by mouth 3 (three) times daily. 12/23/18   Gerlene Fee, NP  memantine (NAMENDA) 5 MG tablet Take 1 tablet (5 mg total) by  mouth 2 (two) times daily. 12/23/18   Gerlene Fee, NP  metoprolol tartrate (LOPRESSOR) 25 MG tablet Take 1 tablet (25 mg total) by mouth 2 (two) times daily. 04/08/19   [name], MD  pantoprazole (PROTONIX) 40 MG tablet TAKE (1) TABLET BY MOUTH DAILY. 10/08/19   Corum, Rex Kras, MD  Polyethyl Glycol-Propyl Glycol (SYSTANE OP) Place 1 drop into both eyes daily as needed. Dry Eyes     [provider]  QUEtiapine (SEROQUEL) 25 MG tablet Take 1 tablet (25 mg total) by mouth at bedtime. 12/23/18   Gerlene Fee, NP  sertraline (ZOLOFT) 50 MG tablet Take 1 tablet (50 mg total) by mouth every morning. 12/23/18   Gerlene Fee, NP  torsemide (DEMADEX) 20 MG tablet Take 1/2   tablet by mouth daily.  If your weight goes up as much as 3 pounds take 2 daily 05/27/19   Ahmed Prima, [place] M, PA-C    Allergies    Penicillins, Biaxin [clarithromycin], Ciprofloxacin,  Emetrol, Feldene [piroxicam], and Sulfa antibiotics  Review of Systems   Review of Systems  Constitutional: Negative for diaphoresis and fever.  HENT: Negative for sore throat.   Eyes: Negative for pain.  Respiratory: Positive for shortness of breath. Negative for cough, hemoptysis and sputum production.   Cardiovascular: Positive for chest pain and leg swelling. Negative for syncope.  Gastrointestinal: Negative for abdominal pain and vomiting.  Genitourinary: Negative for dysuria.  Musculoskeletal: Negative for neck pain.  Skin: Negative for rash.  Neurological: Negative for headaches.    Physical Exam Updated Vital Signs BP (!) 142/93 (BP Location: Left Arm)   Pulse (!) 120   Temp 98.1 F (36.7 C) (Oral)   Resp 20   Ht 5' 4.5" (1.638 m)   Wt 60.3 kg   SpO2 (!) 88%   BMI 22.48 kg/m   Physical Exam Vitals and nursing note reviewed.  Constitutional:      General: She is not in acute distress.    Appearance: She is well-developed and underweight.  HENT:     Head: Normocephalic and atraumatic.  Eyes:     Conjunctiva/sclera: Conjunctivae normal.  Cardiovascular:     Rate and Rhythm: Tachycardia present. Rhythm irregular.     Heart sounds: No murmur.  Pulmonary:     Effort: Tachypnea and accessory muscle usage present. No respiratory distress.     Breath sounds: Normal breath sounds.  Abdominal:     Palpations: Abdomen is soft.     Tenderness: There is no abdominal tenderness.  Musculoskeletal:        General: No tenderness or deformity. Normal range of motion.     Cervical back: Neck supple.     Right lower leg: Edema (trace edema bilateral) present.     Left lower leg: Edema present.  Skin:    General: Skin is warm and dry.     Capillary Refill: Capillary refill takes less than 2 seconds.  Neurological:     General: No focal deficit present.     Mental Status: She is alert.     Sensory: No sensory deficit.     Motor: No weakness.     ED Results / Procedures  / Treatments   Labs (all labs ordered are listed, but only abnormal results are displayed) Labs Reviewed  CBC - Abnormal; Notable for the following components:      Result Value   Hemoglobin 10.2 (*)    HCT 35.3 (*)    MCH 23.9 (*)  MCHC 28.9 (*)    RDW 16.7 (*)    All other components within normal limits  BASIC METABOLIC PANEL - Abnormal; Notable for the following components:   Potassium 3.2 (*)    Chloride 97 (*)    Glucose, Bld 112 (*)    BUN 28 (*)    Creatinine, Ser 1.08 (*)    GFR calc non Af Amer 47 (*)    GFR calc Af Amer 54 (*)    All other components within normal limits  BRAIN NATRIURETIC PEPTIDE - Abnormal; Notable for the following components:   B Natriuretic Peptide 915.0 (*)    All other components within normal limits  PROTIME-INR - Abnormal; Notable for the following components:   Prothrombin Time 17.8 (*)    INR 1.5 (*)    All other components within normal limits  RESPIRATORY PANEL BY RT PCR (FLU A&B, COVID)  URINALYSIS, ROUTINE W REFLEX MICROSCOPIC  MAGNESIUM  TROPONIN I (HIGH SENSITIVITY)  TROPONIN I (HIGH SENSITIVITY)    EKG EKG Interpretation  Date/Time:  Sunday October 12 2019 19:39:25 EDT Ventricular Rate:  99 PR Interval:    QRS Duration: 93 QT Interval:  388 QTC Calculation: 498 R Axis:   -62 Text Interpretation: Atrial fibrillation Left anterior fascicular block Nonspecific T abnormalities, lateral leads Borderline prolonged QT interval rate slower than prior 3/21 Confirmed by Aletta Edouard [phone]) on 10/12/2019 7:46:05 PM   Radiology DG Chest Portable 1 View  Result Date: 10/12/2019 CLINICAL DATA:  Chest pain, shortness of breath. EXAM: PORTABLE CHEST 1 VIEW COMPARISON:  Chest x-ray dated 09/10/2019. FINDINGS: Increased opacity at the RIGHT lung base, presumably reaccumulation of pleural effusion. Patchy bilateral perihilar and LEFT basilar opacities are not significantly changed, persistent edema versus chronic interstitial lung  disease/fibrosis. No pneumothorax is seen. Heart size and mediastinal contours appear stable. IMPRESSION: 1. Increased opacity at the RIGHT lung base, presumably reaccumulation of a moderate-sized pleural effusion. 2. Stable bilateral perihilar and LEFT basilar opacities, persistent interstitial edema versus chronic interstitial lung disease/fibrosis. Electronically Signed   By: Franki Cabot M.D.   On: 10/12/2019 20:36    Procedures Procedures (including critical care time)  Medications Ordered in ED Medications - No data to display  ED Course  I have reviewed the triage vital signs and the nursing notes.  Pertinent labs & imaging results that were available during my care of the patient were reviewed by me and considered in my medical decision making (see chart for details).  Clinical Course as of Oct 12 2150  Nancy Fetter Oct 12, 2019  1958 Hemoglobin low at 10.2, but  baseline for patient   [MB]  2117 Discussed with [facility] hospitalist Dr. Denton Brick who will see the patient for admission   [MB]    Clinical Course User Index [MB] Hayden Rasmussen, MD   MDM Rules/Calculators/A&P                     This patient complains of shortness of breath and chest pain; this involves an extensive number of treatment Options and is a complaint that carries with it a high risk of complications and Morbidity. The differential includes CHF, ACS, vascular, anemia, metabolic derangement, pneumonia, Covid  I ordered, reviewed and interpreted labs, which included mildly low potassium 3.2.  Hemoglobin low at 10.2 better than baseline.  Elevated BNP of 915. I ordered medication Lasix and potassium I ordered imaging studies which included chest x-ray and I independently    visualized and  interpreted imaging which showed recurrence right pleural effusion and increased interstitial markings Additional history obtained from daughter Previous records obtained and reviewed prior admission last month for similar  presentation and found to have pleural effusion that ended up needing to be drained. I consulted [facility] hospitalist Dr. Denton Brick and discussed lab and imaging findings  Critical Interventions: Identifying hypoxia and placing the patient on oxygen and diuresing  After the interventions stated above, I reevaluated the patient and found remains hypoxic and will need admission for further work-up and management CHA2DS2/VAS Stroke Risk Points  Current as of a minute ago     5 >= 2 Points: High Risk  1 - 1.99 Points: Medium Risk  0 Points: Low Risk    The previous score was 4 on 11/28/2018.: Last Change:     Details    This score determines the patient's risk of having a stroke if the  patient has atrial fibrillation.       Points Metrics  1 Has Congestive Heart Failure:  Yes    Current as of a minute ago  0 Has Vascular Disease:  No    Current as of a minute ago  1 Has Hypertension:  Yes    Current as of a minute ago  2 Age:  46    Current as of a minute ago  0 Has Diabetes:  No    Current as of a minute ago  0 Had Stroke:  No  Had TIA:  No  Had thromboembolism:  No    Current as of a minute ago  1 Female:  Yes    Current as of a minute ago           Final Clinical Impression(s) / ED Diagnoses Final diagnoses:  Hypoxia  Acute congestive heart failure, unspecified heart failure type (HCC)  Atrial fibrillation with RVR (HCC)  Pleural effusion on right  Hypokalemia    Rx / DC Orders ED Discharge Orders    None       Hayden Rasmussen, MD 10/13/19 [phone]

## 2019-10-12 NOTE — ED Notes (Signed)
Family in room  

## 2019-10-13 ENCOUNTER — Inpatient Hospital Stay (HOSPITAL_COMMUNITY): Payer: PPO

## 2019-10-13 ENCOUNTER — Encounter (HOSPITAL_COMMUNITY): Payer: Self-pay | Admitting: Internal Medicine

## 2019-10-13 DIAGNOSIS — I5031 Acute diastolic (congestive) heart failure: Secondary | ICD-10-CM

## 2019-10-13 DIAGNOSIS — I4821 Permanent atrial fibrillation: Secondary | ICD-10-CM

## 2019-10-13 LAB — BASIC METABOLIC PANEL
Anion gap: 10 (ref 5–15)
BUN: 24 mg/dL — ABNORMAL HIGH (ref 8–23)
CO2: 33 mmol/L — ABNORMAL HIGH (ref 22–32)
Calcium: 9.4 mg/dL (ref 8.9–10.3)
Chloride: 98 mmol/L (ref 98–111)
Creatinine, Ser: 0.99 mg/dL (ref 0.44–1.00)
GFR calc Af Amer: >60 mL/min (ref >60)
GFR calc non Af Amer: 52 mL/min — ABNORMAL LOW (ref >60)
Glucose, Bld: 106 mg/dL — ABNORMAL HIGH (ref 70–99)
Potassium: 3.6 mmol/L (ref 3.5–5.1)
Sodium: 141 mmol/L (ref 135–145)

## 2019-10-13 MED ORDER — APIXABAN 5 MG PO TABS
5.0000 mg | ORAL_TABLET | Freq: Two times a day (BID) | ORAL | Status: DC
  Administered 2019-10-13 – 2019-10-14 (×3): 5 mg via ORAL
  Filled 2019-10-13 (×3): qty 1

## 2019-10-13 MED ORDER — HYDRALAZINE HCL 25 MG PO TABS
25.0000 mg | ORAL_TABLET | Freq: Four times a day (QID) | ORAL | Status: DC | PRN
  Administered 2019-10-13: 25 mg via ORAL
  Filled 2019-10-13: qty 1

## 2019-10-13 NOTE — Progress Notes (Addendum)
Thoracentesis complete no signs of distress.  

## 2019-10-13 NOTE — Progress Notes (Signed)
[facility]GR[facility]   Kristy Mckinney  [id number]  DOB: Jan 03, 1932  DOA: 10/12/2019 PCP: [name], MD   Brief Admission Hx: 86 year old female with chronic atrial fibrillation, diastolic CHF, hypertension, recently discharged after having A. fib RVR and a pleural effusion presented with shortness of breath and difficulty breathing and reaccumulation of the pleural effusion.  MDM/Assessment & Plan:   1. Acute respiratory failure with hypoxia-suspect symptoms secondary to reaccumulation of moderate sized pleural effusion.  She has been sent for a thoracentesis.  Supplemental oxygen ordered for symptoms.  Follow clinical response.  Hopefully she will improve after thoracentesis. 2. Chronic atrial fibrillation-she is on amiodarone 200 mg daily Cardizem CD 180 mg metoprolol and apixaban for full anticoagulation.  Will monitor telemetry.  Heart rates better controlled now.  Follow. 3. Diastolic CHF exacerbation-patient was given IV Lasix x1 dose.  She is feeling better.  She has diuresed some.  She has been resumed on her home torsemide.  Follow daily weights intake and output. 4. Atypical chest pain-likely secondary to pleural effusion treating with thoracentesis as ordered. 5. Essential hypertension-stable well-controlled on home meds which have been resumed. 6. Depression-stable resume home sertraline. 7. Right pleural effusion-moderately sized-patient has been ordered to receive radiologist performed thoracentesis.  DVT prophylaxis: SCDs Code Status: full  Family Communication: telephone call  Disposition Plan: patient requires thoracentesis, PT eval    Procedures:  Thoracentesis   Antimicrobials:  N/a    Subjective: Pt reports that her SOB is slightly better this morning, no chest pain, no palpitations  Objective: Vitals:   10/13/19 0500 10/13/19 0620 10/13/19 1100 10/13/19 1124  BP:  128/76 131/64 113/82  Pulse:  (!) 104 86 86  Resp:  18 18 20    Temp:  98.4 F (36.9 C)    TempSrc:  Oral    SpO2:  95% 100% 100%  Weight: 60.3 kg     Height:        Intake/Output Summary (Last 24 hours) at 10/13/2019 1215 Last data filed at 10/13/2019 [id number] Gross per 24 hour  Intake 240 ml  Output 500 ml  Net -260 ml   Filed Weights   10/12/19 1926 10/12/19 2315 10/13/19 0500  Weight: 60.3 kg 60.4 kg 60.3 kg   REVIEW OF SYSTEMS  As per history otherwise all reviewed and reported negative  Exam:  General exam: Elderly female lying in the bed she is awake and alert in no apparent distress.  She is eating breakfast. Respiratory system: Diminished breath sounds right lower lobe. No increased work of breathing. Cardiovascular system: S1 & S2 heard. No JVD, murmurs, gallops, clicks or pedal edema. Gastrointestinal system: Abdomen is nondistended, soft and nontender. Normal bowel sounds heard. Central nervous system: Alert and oriented. No focal neurological deficits. Extremities: no cyanosis or clubbing.  Data Reviewed: Basic Metabolic Panel: Recent Labs  Lab 10/12/19 1942 10/12/19 2156 10/13/19 0421  NA 139  --  141  K 3.2*  --  3.6  CL 97*  --  98  CO2 31  --  33*  GLUCOSE 112*  --  106*  BUN 28*  --  24*  CREATININE 1.08*  --  0.99  CALCIUM 9.8  --  9.4  MG  --  2.2  --    Liver Function Tests: No results for input(s): AST, ALT, ALKPHOS, BILITOT, PROT, ALBUMIN in the last 168 hours. No results for input(s): LIPASE, AMYLASE in the last 168 hours. No results for input(s): AMMONIA in  the last 168 hours. CBC: Recent Labs  Lab 10/12/19 1942  WBC 8.8  HGB 10.2*  HCT 35.3*  MCV 82.7  PLT 288   Cardiac Enzymes: No results for input(s): CKTOTAL, CKMB, CKMBINDEX, TROPONINI in the last 168 hours. CBG (last 3)  No results for input(s): GLUCAP in the last 72 hours. Recent Results (from the past 240 hour(s))  Respiratory Panel by RT PCR (Flu A&B, Covid) - Nasopharyngeal Swab     Status: None   Collection Time: 10/12/19  8:49 PM    Specimen: Nasopharyngeal Swab  Result Value Ref Range Status   SARS Coronavirus 2 by RT PCR NEGATIVE NEGATIVE Final    Comment: (NOTE) SARS-CoV-2 target nucleic acids are NOT DETECTED. The SARS-CoV-2 RNA is generally detectable in upper respiratoy specimens during the acute phase of infection. The lowest concentration of SARS-CoV-2 viral copies this assay can detect is 131 copies/mL. A negative result does not preclude SARS-Cov-2 infection and should not be used as the sole basis for treatment or other patient management decisions. A negative result may occur with  improper specimen collection/handling, submission of specimen other than nasopharyngeal swab, presence of viral mutation(s) within the areas targeted by this assay, and inadequate number of viral copies (<131 copies/mL). A negative result must be combined with clinical observations, patient history, and epidemiological information. The expected result is Negative. Fact Sheet for Patients:  PinkCheek.be Fact Sheet for Healthcare Providers:  GravelBags.it This test is not yet ap proved or cleared by the [place] FDA and  has been authorized for detection and/or diagnosis of SARS-CoV-2 by FDA under an Emergency Use Authorization (EUA). This EUA will remain  in effect (meaning this test can be used) for the duration of the COVID-19 declaration under Section 564(b)(1) of the Act, 21 U.S.C. section 360bbb-3(b)(1), unless the authorization is terminated or revoked sooner.    Influenza A by PCR NEGATIVE NEGATIVE Final   Influenza B by PCR NEGATIVE NEGATIVE Final    Comment: (NOTE) The Xpert Xpress SARS-CoV-2/FLU/RSV assay is intended as an aid in  the diagnosis of influenza from Nasopharyngeal swab specimens and  should not be used as a sole basis for treatment. Nasal washings and  aspirates are unacceptable for Xpert Xpress SARS-CoV-2/FLU/RSV  testing. Fact  Sheet for Patients: PinkCheek.be Fact Sheet for Healthcare Providers: GravelBags.it This test is not yet approved or cleared by the [place] FDA and  has been authorized for detection and/or diagnosis of SARS-CoV-2 by  FDA under an Emergency Use Authorization (EUA). This EUA will remain  in effect (meaning this test can be used) for the duration of the  Covid-19 declaration under Section 564(b)(1) of the Act, 21  U.S.C. section 360bbb-3(b)(1), unless the authorization is  terminated or revoked. Performed at [facility], [address], [place], [place] [address]      Studies: DG Chest 1 View  Result Date: 10/13/2019 CLINICAL DATA:  Status post right thoracentesis. EXAM: CHEST  1 VIEW COMPARISON:  October 12, 2019. FINDINGS: Stable cardiomediastinal silhouette. Atherosclerosis of thoracic aorta is noted. Right pleural effusion is significantly smaller status post thoracentesis. No pneumothorax is noted. IMPRESSION: Right pleural effusion is significantly smaller status post thoracentesis. No pneumothorax is noted. Aortic Atherosclerosis (ICD10-I70.0). Electronically Signed   By: Marijo Conception M.D.   On: 10/13/2019 11:37   DG Chest Portable 1 View  Result Date: 10/12/2019 CLINICAL DATA:  Chest pain, shortness of breath. EXAM: PORTABLE CHEST 1 VIEW COMPARISON:  Chest x-ray dated 09/10/2019. FINDINGS:  Increased opacity at the RIGHT lung base, presumably reaccumulation of pleural effusion. Patchy bilateral perihilar and LEFT basilar opacities are not significantly changed, persistent edema versus chronic interstitial lung disease/fibrosis. No pneumothorax is seen. Heart size and mediastinal contours appear stable. IMPRESSION: 1. Increased opacity at the RIGHT lung base, presumably reaccumulation of a moderate-sized pleural effusion. 2. Stable bilateral perihilar and LEFT basilar opacities, persistent interstitial edema versus chronic  interstitial lung disease/fibrosis. Electronically Signed   By: Franki Cabot M.D.   On: 10/12/2019 20:36   US THORACENTESIS ASP PLEURAL SPACE W/IMG GUIDE  Result Date: 10/13/2019 INDICATION: Right pleural effusion. EXAM: ULTRASOUND GUIDED right THORACENTESIS MEDICATIONS: None. COMPLICATIONS: None immediate. PROCEDURE: An ultrasound guided thoracentesis was thoroughly discussed with the patient and questions answered. The benefits, risks, alternatives and complications were also discussed. The patient understands and wishes to proceed with the procedure. Written consent was obtained. Ultrasound was performed to localize and mark an adequate pocket of fluid in the right chest. The area was then prepped and draped in the normal sterile fashion. 1% Lidocaine was used for local anesthesia. Under ultrasound guidance a thoracentesis catheter was introduced. Thoracentesis was performed. The catheter was removed and a dressing applied. FINDINGS: A total of approximately 1.3 L of serous fluid was removed. IMPRESSION: Successful ultrasound guided right thoracentesis yielding 1.3 L of pleural fluid. Electronically Signed   By: Marijo Conception M.D.   On: 10/13/2019 11:42     Scheduled Meds: . amiodarone  200 mg Oral Daily  . diltiazem  180 mg Oral Daily  . furosemide  40 mg Intravenous Once  . memantine  5 mg Oral BID  . metoprolol tartrate  25 mg Oral BID  . pantoprazole  40 mg Oral Daily  . potassium chloride SA  20 mEq Oral Once  . QUEtiapine  25 mg Oral QHS  . torsemide  10 mg Oral Daily   Continuous Infusions:  Principal Problem:   Acute respiratory failure (HCC) Active Problems:   Acute diastolic CHF (congestive heart failure) ([place])   Atrial fibrillation ([place])   Time spent:   Irwin Brakeman, MD [facility] 10/13/2019, 12:15 PM    LOS: 1 day  How to contact the [facility] Attending or Consulting provider [place] or covering provider during after hours [place], for this patient?  1. Check  the care team in [facility] and look for a) attending/consulting TRH provider listed and b) the [facility] team listed 2. Log into [email or web address] and use [place] universal password to access. If you do not have the password, please contact the hospital operator. 3. Locate the [facility] provider you are looking for under [facility] and page to a number that you can be directly reached. 4. If you still have difficulty reaching the provider, please page the [facility] (Director on Call) for the Hospitalists listed on amion for assistance.

## 2019-10-14 LAB — CBC WITH DIFFERENTIAL/PLATELET
Abs Immature Granulocytes: 0.03 10*3/uL (ref 0.00–0.07)
Basophils Absolute: 0 10*3/uL (ref 0.0–0.1)
Basophils Relative: 0 %
Eosinophils Absolute: 0.2 10*3/uL (ref 0.0–0.5)
Eosinophils Relative: 3 %
HCT: 32 % — ABNORMAL LOW (ref 36.0–46.0)
Hemoglobin: 9.3 g/dL — ABNORMAL LOW (ref 12.0–15.0)
Immature Granulocytes: 0 %
Lymphocytes Relative: 21 %
Lymphs Abs: 1.5 10*3/uL (ref 0.7–4.0)
MCH: 24.2 pg — ABNORMAL LOW (ref 26.0–34.0)
MCHC: 29.1 g/dL — ABNORMAL LOW (ref 30.0–36.0)
MCV: 83.1 fL (ref 80.0–100.0)
Monocytes Absolute: 0.6 10*3/uL (ref 0.1–1.0)
Monocytes Relative: 8 %
Neutro Abs: 4.6 10*3/uL (ref 1.7–7.7)
Neutrophils Relative %: 68 %
Platelets: 220 10*3/uL (ref 150–400)
RBC: 3.85 MIL/uL — ABNORMAL LOW (ref 3.87–5.11)
RDW: 16.4 % — ABNORMAL HIGH (ref 11.5–15.5)
WBC: 6.9 10*3/uL (ref 4.0–10.5)
nRBC: 0 % (ref 0.0–0.2)

## 2019-10-14 LAB — BASIC METABOLIC PANEL
Anion gap: 9 (ref 5–15)
BUN: 26 mg/dL — ABNORMAL HIGH (ref 8–23)
CO2: 34 mmol/L — ABNORMAL HIGH (ref 22–32)
Calcium: 9 mg/dL (ref 8.9–10.3)
Chloride: 97 mmol/L — ABNORMAL LOW (ref 98–111)
Creatinine, Ser: 1.28 mg/dL — ABNORMAL HIGH (ref 0.44–1.00)
GFR calc Af Amer: 44 mL/min — ABNORMAL LOW (ref >60)
GFR calc non Af Amer: 38 mL/min — ABNORMAL LOW (ref >60)
Glucose, Bld: 97 mg/dL (ref 70–99)
Potassium: 3.8 mmol/L (ref 3.5–5.1)
Sodium: 140 mmol/L (ref 135–145)

## 2019-10-14 LAB — MAGNESIUM: Magnesium: 1.9 mg/dL (ref 1.7–2.4)

## 2019-10-14 MED ORDER — AMIODARONE HCL 200 MG PO TABS: 200.0000 mg | ORAL_TABLET | Freq: Every day | ORAL | 1 refills | Status: DC

## 2019-10-14 MED ORDER — TORSEMIDE 20 MG PO TABS: 10.0000 mg | ORAL_TABLET | Freq: Every day | ORAL | Status: DC

## 2019-10-14 NOTE — Plan of Care (Signed)
  Problem: Acute Rehab PT Goals(only PT should resolve) Goal: Patient Will Transfer Sit To/From Stand Outcome: Progressing Flowsheets (Taken 10/14/2019 [phone]) Patient will transfer sit to/from stand: with modified independence Goal: Pt Will Transfer Bed To Chair/Chair To Bed Outcome: Progressing Flowsheets (Taken 10/14/2019 0928) Pt will Transfer Bed to Chair/Chair to Bed: with modified independence Goal: Pt Will Ambulate Outcome: Progressing Flowsheets (Taken 10/14/2019 0928) Pt will Ambulate:  50 feet  with supervision Goal: Pt/caregiver will Perform Home Exercise Program Outcome: Progressing Flowsheets (Taken 10/14/2019 0928) Pt/caregiver will Perform Home Exercise Program:  For increased strengthening  For improved balance  Independently  9:28 AM, 10/14/19 Mearl Latin PT, DPT Physical Therapist at [facility]

## 2019-10-14 NOTE — Discharge Instructions (Signed)
IMPORTANT INFORMATION: PAY CLOSE ATTENTION   PHYSICIAN DISCHARGE INSTRUCTIONS  Follow with Primary care provider  Corum, Rex Kras, MD  and other consultants as instructed by your Hospitalist Physician  [place] IF SYMPTOMS COME BACK, WORSEN OR NEW PROBLEM DEVELOPS   Please note: You were cared for by a hospitalist during your hospital stay. Every effort will be made to forward records to your primary care provider.  You can request that your primary care provider send for your hospital records if they have not received them.  Once you are discharged, your primary care physician will handle any further medical issues. Please note that NO REFILLS for any discharge medications will be authorized once you are discharged, as it is imperative that you return to your primary care physician (or establish a relationship with a primary care physician if you do not have one) for your post hospital discharge needs so that they can reassess your need for medications and monitor your lab values.  Please get a complete blood count and chemistry panel checked by your Primary MD at your next visit, and again as instructed by your Primary MD.  Get Medicines reviewed and adjusted: Please take all your medications with you for your next visit with your Primary MD  Laboratory/radiological data: Please request your Primary MD to go over all hospital tests and procedure/radiological results at the follow up, please ask your primary care provider to get all Hospital records sent to his/her office.  In some cases, they will be blood work, cultures and biopsy results pending at the time of your discharge. Please request that your primary care provider follow up on these results.  If you are diabetic, please bring your blood sugar readings with you to your follow up appointment with primary care.    Please call and make your follow up appointments as soon as possible.    Also Note  the following: If you experience worsening of your admission symptoms, develop shortness of breath, life threatening emergency, suicidal or homicidal thoughts you must seek medical attention immediately by calling 911 or calling your MD immediately  if symptoms less severe.  You must read complete instructions/literature along with all the possible adverse reactions/side effects for all the Medicines you take and that have been prescribed to you. Take any new Medicines after you have completely understood and accpet all the possible adverse reactions/side effects.   Do not drive when taking Pain medications or sleeping medications (Benzodiazepines)  Do not take more than prescribed Pain, Sleep and Anxiety Medications. It is not advisable to combine anxiety,sleep and pain medications without talking with your primary care practitioner  Special Instructions: If you have smoked or chewed Tobacco  in the last 2 yrs please stop smoking, stop any regular Alcohol  and or any Recreational drug use.  Wear Seat belts while driving.  Do not drive if taking any narcotic, mind altering or controlled substances or recreational drugs or alcohol.

## 2019-10-14 NOTE — Evaluation (Signed)
Physical Therapy Evaluation Patient Details Name: Kristy Mckinney MRN: [id number] DOB: 1932/06/28 Today's Date: 10/14/2019   History of Present Illness  Kristy Mckinney is a 86 y.o. female with medical history significant for atrial fibrillation with RVR, diastolic CHF, hypertension.Patient presented to the ED with complaints of difficulty breathing, and chest pain started yesterday.  Chest pain is right-sided and present only when she takes a deep breath in.  She reports some mild chronic cough productive of clear sputum-improved compared to prior.  She reports slight fluctuation in weight, but less than 3 pounds, since discharge from the hospital about 2 months ago.  She denies increasing lower extremity swelling.  She reports compliance with her medications torsemide, Eliquis, amiodarone, metoprolol and Cardizem.Patient is not on home O2.    Clinical Impression  Patient limited for functional mobility as stated below secondary to BLE weakness, fatigue and impaired standing balance. Patient able to complete bed mobility independently with slightly increased time to complete. She requires guard for safety and balance with transfers and gait but does not have loss of balance during session. Patient ambulates with slow, shuffled steps in room on supplemental O2 without SOB. Patient educated on using RW upon discharge home due to decreased foot clearance and to decrease the risk of falls at home.  Patient will benefit from continued physical therapy in hospital and recommended venue below to increase strength, balance, endurance for safe ADLs and gait.     Follow Up Recommendations Home health PT    Equipment Recommendations  None recommended by PT    Recommendations for Other Services       Precautions / Restrictions Precautions Precautions: Fall Restrictions Weight Bearing Restrictions: No      Mobility  Bed Mobility Overal bed mobility: Modified Independent              General bed mobility comments: minmally increased time to transition to seated EOB  Transfers Overall transfer level: Needs assistance Equipment used: None Transfers: Sit to/from [employer] Transfers Sit to Stand: Min guard Stand pivot transfers: Min guard       General transfer comment: without AD, guard for safety and balance  Ambulation/Gait Ambulation/Gait assistance: Min guard Gait Distance (Feet): 20 Feet Assistive device: None Gait Pattern/deviations: Trunk flexed;Decreased step length - right;Decreased step length - left;Shuffle Gait velocity: decreased   General Gait Details: Patient ambulates without AD; slow, labored cadence without SOB on supplemental O2, shuffled gait with poor foot clearance  Stairs            Wheelchair Mobility    Modified Rankin (Stroke Patients Only)       Balance Overall balance assessment: Needs assistance Sitting-balance support: No upper extremity supported;Feet supported Sitting balance-Leahy Scale: Normal Sitting balance - Comments: seated EOB   Standing balance support: During functional activity;No upper extremity supported Standing balance-Leahy Scale: Good Standing balance comment: good/fair without AD                             Pertinent Vitals/Pain Pain Assessment: No/denies pain    Home Living Family/patient expects to be discharged to:: Private residence Living Arrangements: Alone Available Help at Discharge: Family;Available PRN/intermittently Type of Home: Apartment Home Access: Level entry     Home Layout: One level Home Equipment: Cane - single point;Shower seat;Bedside commode;Walker - 2 wheels      Prior Function Level of Independence: Independent with assistive device(s)  Comments: household 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     Hand Dominance   Dominant Hand: Right    Extremity/Trunk Assessment   Upper Extremity Assessment Upper  Extremity Assessment: Overall WFL for tasks assessed    Lower Extremity Assessment Lower Extremity Assessment: Overall WFL for tasks assessed    Cervical / Trunk Assessment Cervical / Trunk Assessment: Kyphotic  Communication   Communication: No difficulties  Cognition Arousal/Alertness: Awake/alert Behavior During Therapy: WFL for tasks assessed/performed Overall Cognitive Status: Within Functional Limits for tasks assessed                                        General Comments      Exercises     Assessment/Plan    PT Assessment Patient needs continued PT services  PT Problem List Decreased strength;Decreased mobility;Decreased activity tolerance;Decreased balance;Cardiopulmonary status limiting activity       PT Treatment Interventions DME instruction;Therapeutic exercise;Gait training;Balance training;Stair training;Neuromuscular re-education;Functional mobility training;Therapeutic activities;Patient/family education    PT Goals (Current goals can be found in the Care Plan section)  Acute Rehab PT Goals Patient Stated Goal: Return home to her apartment PT Goal Formulation: With patient Time For Goal Achievement: 10/28/19 Potential to Achieve Goals: Good    Frequency Min 3X/week   Barriers to discharge        Co-evaluation               AM-PAC PT "6 Clicks" Mobility  Outcome Measure Help needed turning from your back to your side while in a flat bed without using bedrails?: None Help needed moving from lying on your back to sitting on the side of a flat bed without using bedrails?: None Help needed moving to and from a bed to a chair (including a wheelchair)?: A Little Help needed standing up from a chair using your arms (e.g., wheelchair or bedside chair)?: None Help needed to walk in hospital room?: A Little Help needed climbing 3-5 steps with a railing? : A Lot 6 Click Score: 20    End of Session Equipment Utilized During  Treatment: Oxygen Activity Tolerance: Patient tolerated treatment well Patient left: in chair;with call bell/phone within reach Nurse Communication: Mobility status PT Visit Diagnosis: Unsteadiness on feet (R26.81);Other abnormalities of gait and mobility (R26.89);Muscle weakness (generalized) (M62.81)    Time: [id number] PT Time Calculation (min) (ACUTE ONLY): 16 min   Charges:   PT Evaluation $PT Eval Moderate Complexity: 1 Mod PT Treatments $Therapeutic Activity: 8-22 mins       9:26 AM, 10/14/19 Mearl Latin PT, DPT Physical Therapist at [facility]

## 2019-10-14 NOTE — TOC Transition Note (Addendum)
Transition of Care [facility]) - CM/SW Discharge Note   Patient Details  Name: Kristy Mckinney MRN: [id number] Date of Birth: 1931-11-11  Transition [facility] CM/SW Contact:  Boneta Lucks, RN Phone Number: 10/14/2019, 3:01 PM   Clinical Narrative:   Patient admitted with acute respiratory failure. Patient lives alone. PT is recommending HHPT.  Patient has used [facility] in the past. TOC called they could not accepted due to staffing.  TOC called multiple companies, due to HTA received many denials.  Cassie with [facility] accepted the referral, requesting to pair as soon as possible.   Patient is also requiring oxygen, orders have been placed. Choices given, Juliann Pulse with [facility] accepted the referral and delivering oxygen to the room.   Updated [place] [phone] - benefit provided by Insurance to follow up with patient.     Final next level of care: [place] Barriers to Discharge: Barriers Resolved   Patient Goals and CMS Choice Patient states their goals for this hospitalization and ongoing recovery are:: to go home. CMS Medicare.gov Compare Post Acute Care list provided to:: Patient Choice offered to / list presented to : Patient  Discharge Placement     Discharge Plan and Services                DME Arranged: Oxygen DME Agency: [facility] Date DME Agency Contacted: 10/14/19 Time DME Agency Contacted: [id number]     [place] Date [place]: 10/14/19 Time HH Agency Contacted: 1500 Representative spoke with at [place]: Cassie    Readmission Risk Interventions Readmission Risk Prevention Plan 09/12/2019 05/10/2019 05/05/2019  Post Dischage Appt - - -  Appt Comments - - -  Medication Screening - - -  Transportation Screening Complete Complete -  Medication Review Press photographer) Complete Complete -  PCP or Specialist appointment within 3-5 days of discharge - Complete -  [place] or [place]  Complete Complete Complete  SW Recovery Care/Counseling Consult Complete Complete Complete  Palliative Care Screening Not Applicable Complete Complete  [place] Not Applicable Not Applicable Not Applicable  Some recent data might be hidden

## 2019-10-14 NOTE — Progress Notes (Signed)
SATURATION QUALIFICATIONS: (This note is used to comply with regulatory documentation for home oxygen)  Patient Saturations on Room Air at Rest = 94%  Patient Saturations on Room Air while Ambulating = 88%  Patient Saturations on 2 Liters of oxygen while Ambulating = 95%  Please briefly explain why patient needs home oxygen: Pt desaturates with ambulation on RA.

## 2019-10-14 NOTE — Discharge Summary (Signed)
Physician Discharge Summary  Kristy Mckinney [id number] DOB: 1932-03-10 DOA: 10/12/2019  PCP: [name], MD  Admit date: 10/12/2019 Discharge date: 10/14/2019  Admitted From:  Home  Disposition: Home with [facility]   Recommendations for Outpatient Follow-up:  1. Follow up with PCP in 1 weeks 2. Follow up with cardiology in 2-3 weeks  Home Health: Physical therapy  Discharge Condition: STABLE   CODE STATUS: FULL    Brief Hospitalization Summary: Please see all hospital notes, images, labs for full details of the hospitalization. ADMISSION HPI: Kristy Mckinney is a 86 y.o. female with medical history significant for atrial fibrillation with RVR, diastolic CHF, hypertension. Patient presented to the ED with complaints of difficulty breathing, and chest pain started yesterday.  Chest pain is right-sided and present only when she takes a deep breath in.  She reports some mild chronic cough productive of clear sputum-improved compared to prior.  She reports slight fluctuation in weight, but less than 3 pounds, since discharge from the hospital about 2 months ago.  She denies increasing lower extremity swelling.  She reports compliance with her medications torsemide, Eliquis, amiodarone, metoprolol and Cardizem. Patient is not on home O2.  Recent hospitalization on 3/3-3/6 for acute respiratory failure in the setting of atrial fibrillation with RVR flash pulmonary edema, mild acute on chronic CHF.  She required thoracentesis with 1.2 L of fluid removed.  Discharged home on amiodarone 200 twice a day for 1 month.    She is to start 200 mg of amiodarone once a day.   ED Course: Heart rate fluctuating from upper 90s to 120s.  O2 sats 88% on room air, placed on nasal cannula 4 L.  Blood pressure 130s to 140s.  BNP elevated 915, troponin- 9.  EKG shows atrial fibrillation rate 99.  Does recommendation of moderate-sized pleural effusion.  WBC 8.8.  Hospitalist to admit for further evaluation and  management.  Brief Admission Hx: 86 year old female with chronic atrial fibrillation, diastolic CHF, hypertension, recently discharged after having A. fib RVR and a pleural effusion presented with shortness of breath and difficulty breathing and reaccumulation of the pleural effusion.  MDM/Assessment & Plan:   1. Acute respiratory failure with hypoxia-suspect symptoms secondary to reaccumulation of moderate sized pleural effusion.  She had a successful thoracentesis on 10/13/19 where 1.3L of fluid was removed. She feels much better after thoracentesis.  Home oxygen eval will be done prior to discharge home.  2. Chronic atrial fibrillation-she is on amiodarone 200 mg daily Cardizem CD 180 mg metoprolol and apixaban for full anticoagulation.  Stable telemetry.  Heart rates better controlled now.  Follow up with cardiology.  3. Diastolic CHF exacerbation-patient was given IV Lasix x1 dose.  She is feeling better.  She has diuresed well.   She has been resumed on her home torsemide.  Follow daily weights intake and output. 4. Atypical chest pain-likely secondary to pleural effusion treated with thoracentesis as ordered. 5. Essential hypertension-stable well-controlled on home meds which have been resumed. 6. Depression-stable resume home sertraline. 7. Right pleural effusion-moderately sized-resolved s/p thoracentesis 1.3L was removed.   DVT prophylaxis: SCDs Code Status: full  Disposition Plan: HOME    Procedures:  Thoracentesis 10/13/19 FINDINGS: A total of approximately 1.3 L of serous fluid was removed.  IMPRESSION: Successful ultrasound guided right thoracentesis yielding 1.3 L of pleural fluid.  Electronically Signed   By: Marijo Conception M.D.   On: 10/13/2019 11:42  Antimicrobials:  N/a    Discharge Diagnoses:  Principal Problem:   Acute respiratory failure (HCC) Active Problems:   Acute diastolic CHF (congestive heart failure) (HCC)   Atrial fibrillation  [facility])   Discharge Instructions:  Allergies as of 10/14/2019      Reactions   Penicillins Anaphylaxis   Did it involve swelling of the face/tongue/throat, SOB, or low BP? Unknown Did it involve sudden or severe rash/hives, skin peeling, or any reaction on the inside of your mouth or nose? Unknown Did you need to seek medical attention at a hospital or doctor's office? Unknown When did it last happen? If all above answers are "NO", may proceed with cephalosporin use.   Biaxin [clarithromycin]    Ciprofloxacin Nausea And Vomiting   Emetrol Nausea Only   swelling   Feldene [piroxicam] Nausea And Vomiting   Sulfa Antibiotics    swelling      Medication List    TAKE these medications   acetaminophen 325 MG tablet Commonly known as: TYLENOL Take 650 mg by mouth every 6 (six) hours as needed.   alendronate 70 MG tablet Commonly known as: FOSAMAX Take 1 tablet (70 mg total) by mouth once a week. Take with a full glass of water on an empty stomach.   amiodarone 200 MG tablet Commonly known as: Pacerone Take 1 tablet (200 mg total) by mouth daily. Planning for 200mg  BID for 1 month and then 200 mg daily, as per cardiology recommendations. What changed: when to take this   apixaban 5 MG Tabs tablet Commonly known as: ELIQUIS Take 1 tablet (5 mg total) by mouth 2 (two) times daily.   benzonatate 100 MG capsule Commonly known as: Tessalon Perles Take 2 capsules (200 mg total) by mouth every 8 (eight) hours as needed (severe coughing spells).   calcium-vitamin D 500-200 MG-UNIT tablet Commonly known as: Os-Cal 500 + D Take 1 tablet by mouth 2 (two) times daily.   diltiazem 180 MG 24 hr capsule Commonly known as: Cardizem CD Take 1 capsule (180 mg total) by mouth daily.   magnesium oxide 400 MG tablet Commonly known as: MAG-OX Take 1 tablet (400 mg total) by mouth 3 (three) times daily.   memantine 5 MG tablet Commonly known as: NAMENDA Take 1 tablet (5 mg total) by  mouth 2 (two) times daily.   metoprolol tartrate 25 MG tablet Commonly known as: LOPRESSOR Take 1 tablet (25 mg total) by mouth 2 (two) times daily.   pantoprazole 40 MG tablet Commonly known as: PROTONIX TAKE (1) TABLET BY MOUTH DAILY.   QUEtiapine 25 MG tablet Commonly known as: SEROQUEL Take 1 tablet (25 mg total) by mouth at bedtime.   sertraline 50 MG tablet Commonly known as: ZOLOFT Take 1 tablet (50 mg total) by mouth every morning.   SYSTANE OP Place 1 drop into both eyes daily as needed. Dry Eyes   torsemide 20 MG tablet Commonly known as: DEMADEX Take 1/2   tablet by mouth daily.  If your weight goes up as much as 3 pounds take 2 daily      Follow-up Information    Corum, Rex Kras, MD. Schedule an appointment as soon as possible for a visit in 1 week(s).   Specialty: Family Medicine Why: Hospital Follow Up  Contact information: [place] [address] [phone]        Satira Sark, MD .   Specialty: Cardiology Contact information: [place] [address] [phone]  Allergies  Allergen Reactions  . Penicillins Anaphylaxis    Did it involve swelling of the face/tongue/throat, SOB, or low BP? Unknown Did it involve sudden or severe rash/hives, skin peeling, or any reaction on the inside of your mouth or nose? Unknown Did you need to seek medical attention at a hospital or doctor's office? Unknown When did it last happen? If all above answers are "NO", may proceed with cephalosporin use.   . Biaxin [Clarithromycin]   . Ciprofloxacin Nausea And Vomiting  . Emetrol Nausea Only    swelling  . Feldene [Piroxicam] Nausea And Vomiting  . Sulfa Antibiotics     swelling   Allergies as of 10/14/2019      Reactions   Penicillins Anaphylaxis   Did it involve swelling of the face/tongue/throat, SOB, or low BP? Unknown Did it involve sudden or severe rash/hives, skin peeling, or any reaction on the inside  of your mouth or nose? Unknown Did you need to seek medical attention at a hospital or doctor's office? Unknown When did it last happen? If all above answers are "NO", may proceed with cephalosporin use.   Biaxin [clarithromycin]    Ciprofloxacin Nausea And Vomiting   Emetrol Nausea Only   swelling   Feldene [piroxicam] Nausea And Vomiting   Sulfa Antibiotics    swelling      Medication List    TAKE these medications   acetaminophen 325 MG tablet Commonly known as: TYLENOL Take 650 mg by mouth every 6 (six) hours as needed.   alendronate 70 MG tablet Commonly known as: FOSAMAX Take 1 tablet (70 mg total) by mouth once a week. Take with a full glass of water on an empty stomach.   amiodarone 200 MG tablet Commonly known as: Pacerone Take 1 tablet (200 mg total) by mouth daily. Planning for 200mg  BID for 1 month and then 200 mg daily, as per cardiology recommendations. What changed: when to take this   apixaban 5 MG Tabs tablet Commonly known as: ELIQUIS Take 1 tablet (5 mg total) by mouth 2 (two) times daily.   benzonatate 100 MG capsule Commonly known as: Tessalon Perles Take 2 capsules (200 mg total) by mouth every 8 (eight) hours as needed (severe coughing spells).   calcium-vitamin D 500-200 MG-UNIT tablet Commonly known as: Os-Cal 500 + D Take 1 tablet by mouth 2 (two) times daily.   diltiazem 180 MG 24 hr capsule Commonly known as: Cardizem CD Take 1 capsule (180 mg total) by mouth daily.   magnesium oxide 400 MG tablet Commonly known as: MAG-OX Take 1 tablet (400 mg total) by mouth 3 (three) times daily.   memantine 5 MG tablet Commonly known as: NAMENDA Take 1 tablet (5 mg total) by mouth 2 (two) times daily.   metoprolol tartrate 25 MG tablet Commonly known as: LOPRESSOR Take 1 tablet (25 mg total) by mouth 2 (two) times daily.   pantoprazole 40 MG tablet Commonly known as: PROTONIX TAKE (1) TABLET BY MOUTH DAILY.   QUEtiapine 25 MG  tablet Commonly known as: SEROQUEL Take 1 tablet (25 mg total) by mouth at bedtime.   sertraline 50 MG tablet Commonly known as: ZOLOFT Take 1 tablet (50 mg total) by mouth every morning.   SYSTANE OP Place 1 drop into both eyes daily as needed. Dry Eyes   torsemide 20 MG tablet Commonly known as: DEMADEX Take 1/2   tablet by mouth daily.  If your weight goes up as much as 3 pounds take  2 daily       Procedures/Studies: DG Chest 1 View  Result Date: 10/13/2019 CLINICAL DATA:  Status post right thoracentesis. EXAM: CHEST  1 VIEW COMPARISON:  October 12, 2019. FINDINGS: Stable cardiomediastinal silhouette. Atherosclerosis of thoracic aorta is noted. Right pleural effusion is significantly smaller status post thoracentesis. No pneumothorax is noted. IMPRESSION: Right pleural effusion is significantly smaller status post thoracentesis. No pneumothorax is noted. Aortic Atherosclerosis (ICD10-I70.0). Electronically Signed   By: Marijo Conception M.D.   On: 10/13/2019 11:37   DG Chest Portable 1 View  Result Date: 10/12/2019 CLINICAL DATA:  Chest pain, shortness of breath. EXAM: PORTABLE CHEST 1 VIEW COMPARISON:  Chest x-ray dated 09/10/2019. FINDINGS: Increased opacity at the RIGHT lung base, presumably reaccumulation of pleural effusion. Patchy bilateral perihilar and LEFT basilar opacities are not significantly changed, persistent edema versus chronic interstitial lung disease/fibrosis. No pneumothorax is seen. Heart size and mediastinal contours appear stable. IMPRESSION: 1. Increased opacity at the RIGHT lung base, presumably reaccumulation of a moderate-sized pleural effusion. 2. Stable bilateral perihilar and LEFT basilar opacities, persistent interstitial edema versus chronic interstitial lung disease/fibrosis. Electronically Signed   By: Franki Cabot M.D.   On: 10/12/2019 20:36   US THORACENTESIS ASP PLEURAL SPACE W/IMG GUIDE  Result Date: 10/13/2019 INDICATION: Right pleural effusion. EXAM:  ULTRASOUND GUIDED right THORACENTESIS MEDICATIONS: None. COMPLICATIONS: None immediate. PROCEDURE: An ultrasound guided thoracentesis was thoroughly discussed with the patient and questions answered. The benefits, risks, alternatives and complications were also discussed. The patient understands and wishes to proceed with the procedure. Written consent was obtained. Ultrasound was performed to localize and mark an adequate pocket of fluid in the right chest. The area was then prepped and draped in the normal sterile fashion. 1% Lidocaine was used for local anesthesia. Under ultrasound guidance a thoracentesis catheter was introduced. Thoracentesis was performed. The catheter was removed and a dressing applied. FINDINGS: A total of approximately 1.3 L of serous fluid was removed. IMPRESSION: Successful ultrasound guided right thoracentesis yielding 1.3 L of pleural fluid. Electronically Signed   By: Marijo Conception M.D.   On: 10/13/2019 11:42      Subjective: Patient says she feels much better after having thoracentesis.  Shortness of breath is resolved.  She has been ambulating in the room.  Denies chest pain.  Discharge Exam: Vitals:   10/13/19 2140 10/14/19 0558  BP: (!) 108/54 119/64  Pulse: 78 75  Resp:  20  Temp:  97.6 F (36.4 C)  SpO2:  99%   Vitals:   10/13/19 2114 10/13/19 2140 10/14/19 0500 10/14/19 0558  BP: (!) 96/49 (!) 108/54  119/64  Pulse: 97 78  75  Resp: 18   20  Temp: 98.7 F (37.1 C)   97.6 F (36.4 C)  TempSrc: Oral   Oral  SpO2: 97%   99%  Weight:   60.3 kg   Height:       General: Pt is alert, awake, not in acute distress Cardiovascular: RRR, S1/S2 +, no rubs, no gallops Respiratory: CTA bilaterally, no wheezing, no rhonchi Abdominal: Soft, NT, ND, bowel sounds + Extremities: no edema, no cyanosis   The results of significant diagnostics from this hospitalization (including imaging, microbiology, ancillary and laboratory) are listed below for reference.      Microbiology: Recent Results (from the past 240 hour(s))  Respiratory Panel by RT PCR (Flu A&B, Covid) - Nasopharyngeal Swab     Status: None   Collection Time: 10/12/19  8:49 PM  Specimen: Nasopharyngeal Swab  Result Value Ref Range Status   SARS Coronavirus 2 by RT PCR NEGATIVE NEGATIVE Final    Comment: (NOTE) SARS-CoV-2 target nucleic acids are NOT DETECTED. The SARS-CoV-2 RNA is generally detectable in upper respiratoy specimens during the acute phase of infection. The lowest concentration of SARS-CoV-2 viral copies this assay can detect is 131 copies/mL. A negative result does not preclude SARS-Cov-2 infection and should not be used as the sole basis for treatment or other patient management decisions. A negative result may occur with  improper specimen collection/handling, submission of specimen other than nasopharyngeal swab, presence of viral mutation(s) within the areas targeted by this assay, and inadequate number of viral copies (<131 copies/mL). A negative result must be combined with clinical observations, patient history, and epidemiological information. The expected result is Negative. Fact Sheet for Patients:  PinkCheek.be Fact Sheet for Healthcare Providers:  GravelBags.it This test is not yet ap proved or cleared by the [place] FDA and  has been authorized for detection and/or diagnosis of SARS-CoV-2 by FDA under an Emergency Use Authorization (EUA). This EUA will remain  in effect (meaning this test can be used) for the duration of the COVID-19 declaration under Section 564(b)(1) of the Act, 21 U.S.C. section 360bbb-3(b)(1), unless the authorization is terminated or revoked sooner.    Influenza A by PCR NEGATIVE NEGATIVE Final   Influenza B by PCR NEGATIVE NEGATIVE Final    Comment: (NOTE) The Xpert Xpress SARS-CoV-2/FLU/RSV assay is intended as an aid in  the diagnosis of influenza from  Nasopharyngeal swab specimens and  should not be used as a sole basis for treatment. Nasal washings and  aspirates are unacceptable for Xpert Xpress SARS-CoV-2/FLU/RSV  testing. Fact Sheet for Patients: PinkCheek.be Fact Sheet for Healthcare Providers: GravelBags.it This test is not yet approved or cleared by the [place] FDA and  has been authorized for detection and/or diagnosis of SARS-CoV-2 by  FDA under an Emergency Use Authorization (EUA). This EUA will remain  in effect (meaning this test can be used) for the duration of the  Covid-19 declaration under Section 564(b)(1) of the Act, 21  U.S.C. section 360bbb-3(b)(1), unless the authorization is  terminated or revoked. Performed at [facility], [address] [place], [place] [address]      Labs: BNP (last 3 results) Recent Labs    05/09/19 0542 09/10/19 1253 10/12/19 1942  BNP 445.0* 1,555.0* [id number]*   Basic Metabolic Panel: Recent Labs  Lab 10/12/19 1942 10/12/19 2156 10/13/19 0421 10/14/19 0422  NA 139  --  141 140  K 3.2*  --  3.6 3.8  CL 97*  --  98 97*  CO2 31  --  33* 34*  GLUCOSE 112*  --  106* 97  BUN 28*  --  24* 26*  CREATININE 1.08*  --  0.99 1.28*  CALCIUM 9.8  --  9.4 9.0  MG  --  2.2  --  1.9   Liver Function Tests: No results for input(s): AST, ALT, ALKPHOS, BILITOT, PROT, ALBUMIN in the last 168 hours. No results for input(s): LIPASE, AMYLASE in the last 168 hours. No results for input(s): AMMONIA in the last 168 hours. CBC: Recent Labs  Lab 10/12/19 1942 10/14/19 0422  WBC 8.8 6.9  NEUTROABS  --  4.6  HGB 10.2* 9.3*  HCT 35.3* 32.0*  MCV 82.7 83.1  PLT 288 220   Cardiac Enzymes: No results for input(s): CKTOTAL, CKMB, CKMBINDEX, TROPONINI in the last 168 hours.  BNP: Invalid input(s): POCBNP CBG: No results for input(s): GLUCAP in the last 168 hours. D-Dimer No results for input(s): DDIMER in the last 72 hours. Hgb  A1c No results for input(s): HGBA1C in the last 72 hours. Lipid Profile No results for input(s): CHOL, HDL, LDLCALC, TRIG, CHOLHDL, LDLDIRECT in the last 72 hours. Thyroid function studies No results for input(s): TSH, T4TOTAL, T3FREE, THYROIDAB in the last 72 hours.  Invalid input(s): FREET3 Anemia work up No results for input(s): VITAMINB12, FOLATE, FERRITIN, TIBC, IRON, RETICCTPCT in the last 72 hours. Urinalysis    Component Value Date/Time   COLORURINE YELLOW 09/10/2019 1248   APPEARANCEUR CLEAR 09/10/2019 1248   LABSPEC 1.018 09/10/2019 1248   PHURINE 6.0 09/10/2019 1248   GLUCOSEU NEGATIVE 09/10/2019 1248   HGBUR NEGATIVE 09/10/2019 [place] 09/10/2019 1248   [place]  09/10/2019 1248   PROTEINUR 100 (A) 09/10/2019 1248   NITRITE NEGATIVE 09/10/2019 1248   LEUKOCYTESUR NEGATIVE 09/10/2019 1248   Sepsis Labs Invalid input(s): PROCALCITONIN,  WBC,  LACTICIDVEN Microbiology Recent Results (from the past 240 hour(s))  Respiratory Panel by RT PCR (Flu A&B, Covid) - Nasopharyngeal Swab     Status: None   Collection Time: 10/12/19  8:49 PM   Specimen: Nasopharyngeal Swab  Result Value Ref Range Status   SARS Coronavirus 2 by RT PCR NEGATIVE NEGATIVE Final    Comment: (NOTE) SARS-CoV-2 target nucleic acids are NOT DETECTED. The SARS-CoV-2 RNA is generally detectable in upper respiratoy specimens during the acute phase of infection. The lowest concentration of SARS-CoV-2 viral copies this assay can detect is 131 copies/mL. A negative result does not preclude SARS-Cov-2 infection and should not be used as the sole basis for treatment or other patient management decisions. A negative result may occur with  improper specimen collection/handling, submission of specimen other than nasopharyngeal swab, presence of viral mutation(s) within the areas targeted by this assay, and inadequate number of viral copies (<131 copies/mL). A negative result must be  combined with clinical observations, patient history, and epidemiological information. The expected result is Negative. Fact Sheet for Patients:  PinkCheek.be Fact Sheet for Healthcare Providers:  GravelBags.it This test is not yet ap proved or cleared by the [place] FDA and  has been authorized for detection and/or diagnosis of SARS-CoV-2 by FDA under an Emergency Use Authorization (EUA). This EUA will remain  in effect (meaning this test can be used) for the duration of the COVID-19 declaration under Section 564(b)(1) of the Act, 21 U.S.C. section 360bbb-3(b)(1), unless the authorization is terminated or revoked sooner.    Influenza A by PCR NEGATIVE NEGATIVE Final   Influenza B by PCR NEGATIVE NEGATIVE Final    Comment: (NOTE) The Xpert Xpress SARS-CoV-2/FLU/RSV assay is intended as an aid in  the diagnosis of influenza from Nasopharyngeal swab specimens and  should not be used as a sole basis for treatment. Nasal washings and  aspirates are unacceptable for Xpert Xpress SARS-CoV-2/FLU/RSV  testing. Fact Sheet for Patients: PinkCheek.be Fact Sheet for Healthcare Providers: GravelBags.it This test is not yet approved or cleared by the [place] FDA and  has been authorized for detection and/or diagnosis of SARS-CoV-2 by  FDA under an Emergency Use Authorization (EUA). This EUA will remain  in effect (meaning this test can be used) for the duration of the  Covid-19 declaration under Section 564(b)(1) of the Act, 21  U.S.C. section 360bbb-3(b)(1), unless the authorization is  terminated or revoked. Performed at [facility], [phone]  [address], [place], [place] [address]    Time coordinating discharge: 32 minutes   SIGNED:  Irwin Brakeman, MD  [facility] 10/14/2019, 9:55 AM How to contact the [facility] Attending or Consulting provider [place] or covering  provider during after hours [place], for this patient?  1. Check the care team in [facility] and look for a) attending/consulting [facility] provider listed and b) the [facility] team listed 2. Log into [email or web address] and use [place]'s universal password to access. If you do not have the password, please contact the hospital operator. 3. Locate the [facility] provider you are looking for under [facility] and page to a number that you can be directly reached. 4. If you still have difficulty reaching the provider, please page the [facility] (Director on Call) for the Hospitalists listed on amion for assistance.

## 2019-10-15 ENCOUNTER — Other Ambulatory Visit: Payer: PPO

## 2019-10-15 ENCOUNTER — Other Ambulatory Visit: Payer: Self-pay

## 2019-10-15 ENCOUNTER — Telehealth: Payer: Self-pay

## 2019-10-15 DIAGNOSIS — Z515 Encounter for palliative care: Secondary | ICD-10-CM

## 2019-10-15 NOTE — Progress Notes (Signed)
PATIENT NAME: Kristy Mckinney DOB: [birth year] MRN: [id number]  PRIMARY CARE PROVIDER: [name], MD  RESPONSIBLE PARTY:  Acct ID - Guarantor Home Phone Work Phone Relationship Acct Type  [id number] Webb [phone]  Self P/F     [address], [place], [place] [address]    PLAN OF CARE and INTERVENTIONS:               1.  GOALS OF CARE/ ADVANCE CARE PLANNING:  Patient wants to remain at home and be able to live indpendently.               2.  PATIENT/CAREGIVER EDUCATION:  Education on fall precautions, education on need to weigh self daily, review meds, support               3.  DISEASE STATUS:  SW and RN made scheduled palliative care home visit. Patient greeted palliative care team at door and states she returned home from the hospital yesterday afternoon around 4 pm. Patient was hospitalized 10-12-19 due to hypoxia and was discharged yesterday. Patient now on home oxygen at 2 liters per minute. Patient states she was informed to wear oxygen for a few days then attempt to taper off. Patient has had 2 hospitalizations in the past month.  Patient denies having any pain at the present time. Patient appears more forgetful and talks about son in laws death being recent then states he died 47 years ago.  Patient reports she a bowl of cereal for breakfast. Social worker talked with patient about Meals on Wheels referral and patient in agreement with SW making referral.   Patient to have [place] visits with [facility] resuming.  . Patient had been receiving PT through home health. Patient denies having any shortness of breath or cough this morning. Patient reports her weight this morning was 130.2 pounds. Patients edema has improved since hospitalization and patient continues to take demadex for edema.  Patient also started on Amiodarone 200 mg daily.  Patient reports she slept well last night. Patient's vital signs are stable. Nurse reviewed patient's discharge instructions with patient as  well as medications. Patient remains a full code. Patient's family remains supportive. Patient remains in agreement with palliative care services. Patient encouraged to contact palliative care with questions or concerns.    HISTORY OF PRESENT ILLNESS:  Patient is a 86 year old patient who resides in senior living apartment.  Patient is followed by palliative care and is seen monthly and PRN.   CODE STATUS: Full Code  ADVANCED DIRECTIVES: Yes MOST FORM: Yes PPS: 50%   PHYSICAL EXAM:   VITALS: Today's Vitals   10/15/19 1016  BP: (!) 102/56  Pulse: 76  Resp: 18  Temp: 98.7 F (37.1 C)  TempSrc: Temporal  SpO2: 98%  Weight: 130 lb 3.2 oz (59.1 kg)  PainSc: 0-No pain    LUNGS: clear to auscultation  CARDIAC: Cor irreg, irreg RRR  EXTREMITIES: Trace edema SKIN: no visible open areas of skin breakdown  NEURO: positive for weakness       Nilda Simmer, RN

## 2019-10-15 NOTE — Progress Notes (Signed)
COMMUNITY PALLIATIVE CARE SW NOTE  PATIENT NAME: Kristy Mckinney DOB: 02-21-1932 MRN: [id number]  PRIMARY CARE PROVIDER: [name], MD  RESPONSIBLE PARTY:  Acct ID - Guarantor Home Phone Work Phone Relationship Acct Type  [id number] Webb [phone]  Self P/F     [address], [place], [place] [address]     PLAN OF CARE and INTERVENTIONS:             1. GOALS OF CARE/ ADVANCE CARE PLANNING:  Patient is a FULL CODE.Patient has a MOST form completed.HCPOA is daughter, [name]. Goal is to live by herself andavoid hospitalizations. 2. SOCIAL/EMOTIONAL/SPIRITUAL ASSESSMENT/ INTERVENTIONS:  SW and RN met with patient in the home. Patient returned from the hospital yesterday. Patient was admitted for acute respiratory failure. Patient is on 2L of O2. Patient has chronic pain in her back. Patient is sleeping well, happy to be back in her own home and sleeping in her bed. Patient's appetite is good, patient discussed her focus on avoiding salt. Patient discussed getting back out into the community again, she is hopeful to return to church. RN reviewed medications. SWprovided supportive counseling, validated patient's concerns, and used active and reflective listening. 3. PATIENT/CAREGIVER EDUCATION/ COPING:  Patient was alert, engaged in discussion. Patient is pleasant. Patient openly expresses her feelings. Patient enjoys time with her family. Patient continues to see her long-time friend weekly and they enjoy company and do puzzles together. Patient relies on her faith for coping. Patient's family and friends are very supportive. 4. PERSONAL EMERGENCY PLAN:  Family will call 9-1-1 for emergencies. Patient wears an alert necklace that she can push for staff at the apartments for emergencies. 5. COMMUNITY RESOURCES COORDINATION/ HEALTH CARE NAVIGATION:  Patient is managing her own care. [facility] will start with home health PT this week. Patient is scheduled to see PCP on 4/12.  Cardiology follow-up is scheduled for 5/21.  6. FINANCIAL/LEGAL CONCERNS/INTERVENTIONS:  Patient is having more difficulty with meals, requested referral to Meals on Wheels.     SOCIAL HX:  Social History   Tobacco Use  . Smoking status: Never Smoker  . Smokeless tobacco: Never Used  Substance Use Topics  . Alcohol use: No    CODE STATUS: FULL CODE ADVANCED DIRECTIVES: Y MOST FORM COMPLETE: Yes. HOSPICE EDUCATION PROVIDED: None.  PPS: Patient is independent of mostADLs. Patient is using her walker right now, as she feels weaker following hospitalization.  I spent44mnutes with patient/family, [id number], support and consultation.   WMargaretmary Lombard LCSW

## 2019-10-15 NOTE — Telephone Encounter (Signed)
SW attempted to contact Juliann Pulse to make Meals on Wheels referral. Left VM with contact information.

## 2019-10-16 ENCOUNTER — Telehealth: Payer: Self-pay | Admitting: Family Medicine

## 2019-10-16 NOTE — Telephone Encounter (Signed)
Tressia Danas is calling and is a physical therapist with [facility] and states the patient was recently in the hospital for acute respiratory failure and is now home. Suezanne Jacquet is requesting the okay to resume physical therapy. Would like to do 2x a week for 3 weeks.   CB# [phone]

## 2019-10-16 NOTE — Telephone Encounter (Signed)
Please advise 

## 2019-10-16 NOTE — Telephone Encounter (Signed)
Yes ok to resume

## 2019-10-20 ENCOUNTER — Encounter: Payer: Self-pay | Admitting: Family Medicine

## 2019-10-20 ENCOUNTER — Ambulatory Visit (INDEPENDENT_AMBULATORY_CARE_PROVIDER_SITE_OTHER): Payer: PPO | Admitting: Family Medicine

## 2019-10-20 ENCOUNTER — Other Ambulatory Visit: Payer: Self-pay

## 2019-10-20 VITALS — BP 104/62 | HR 66 | Temp 98.0°F | Ht 64.5 in | Wt 127.6 lb

## 2019-10-20 DIAGNOSIS — I4891 Unspecified atrial fibrillation: Secondary | ICD-10-CM | POA: Diagnosis not present

## 2019-10-20 DIAGNOSIS — M810 Age-related osteoporosis without current pathological fracture: Secondary | ICD-10-CM | POA: Diagnosis not present

## 2019-10-20 DIAGNOSIS — F419 Anxiety disorder, unspecified: Secondary | ICD-10-CM

## 2019-10-20 DIAGNOSIS — D649 Anemia, unspecified: Secondary | ICD-10-CM

## 2019-10-20 DIAGNOSIS — K219 Gastro-esophageal reflux disease without esophagitis: Secondary | ICD-10-CM

## 2019-10-20 DIAGNOSIS — F329 Major depressive disorder, single episode, unspecified: Secondary | ICD-10-CM

## 2019-10-20 DIAGNOSIS — I5033 Acute on chronic diastolic (congestive) heart failure: Secondary | ICD-10-CM

## 2019-10-20 DIAGNOSIS — F32A Depression, unspecified: Secondary | ICD-10-CM

## 2019-10-20 NOTE — Progress Notes (Signed)
Established Patient Office Visit  Subjective:  Patient ID: Kristy Mckinney, female    DOB: 09-26-1931  Age: 86 y.o. MRN: [id number]  CC:  Chief Complaint  Patient presents with  . Follow-up    2 month f/u on hypertension  Brief Admission Hx: Admission 53/74-60/7 86 year old female with chronic atrial fibrillation, diastolic CHF, hypertension, recently discharged after having A. fib RVR and a pleural effusion presented with shortness of breath and difficulty breathing and reaccumulation of the pleural effusion.  MDM/Assessment & Plan:  1. Acute respiratory failure with hypoxia-suspect symptoms secondary to reaccumulation of moderate sized pleural effusion. She had a successful thoracentesis on 10/13/19 where 1.3L of fluid was removed. She feels much better after thoracentesis.  Home oxygen eval will be done prior to discharge home.  2. Chronic atrial fibrillation-she is on amiodarone 200 mg daily Cardizem CD 180 mg metoprolol and apixaban for full anticoagulation. Stable telemetry. Heart rates better controlled now. Follow up with cardiology.  3. Diastolic CHF exacerbation-patient was given IV Lasix x1 dose. She is feeling better. She has diuresed well.  She has been resumed on her home torsemide. Follow daily weights intake and output. 4. Atypical chest pain-likely secondary to pleural effusion treated with thoracentesis as ordered. 5. Essential hypertension-stable well-controlled on home meds which have been resumed. 6. Depression-stable resume home sertraline. Right pleural effusion-moderately sized-resolved s/p thoracentesis 1.3L was removed.   HPI ASCHLEY Mckinney presents for HTN-pt states systolic 0000000 weights 125-127, PT coming 2x week for strength. Pt lives on her own and children assisting with ADL. Pt went to grocery to shop with children yesterday. Pt showering alone.  No stairs in home.  Pt has 1 step to get into home.  Pt now on Palliative care. Recent  admission for hypoxia-feeling better after thoracentesis-amiodarone increased.pt to see cardio next week DEXA-9/19  Past Medical History:  Diagnosis Date  . Abdominal distension (gaseous)   . Acute on chronic diastolic heart failure ([place])   . Age-related osteoporosis without current pathological fracture   . Anemia, unspecified   . Anxiety disorder, unspecified   . Atrial fibrillation ([place])   . Bronchitis, not specified as acute or chronic   . Chronic back pain   . Diarrhea   . Disorder of the skin and subcutaneous tissue, unspecified   . Dysphagia 05/03/2016  . Essential (primary) hypertension   . Gastro-esophageal reflux disease with esophagitis   . GERD (gastroesophageal reflux disease)   . Iron deficiency anemia, unspecified   . Irritable bowel syndrome with diarrhea   . Left temporomandibular joint disorder, unspecified   . Lumbago   . Pneumonia   . Shortness of breath   . Unspecified dementia without behavioral disturbance ([place])   . Unspecified fracture of unspecified wrist and hand, initial encounter for closed fracture   . Vitamin D deficiency, unspecified     Past Surgical History:  Procedure Laterality Date  . ABDOMINAL HYSTERECTOMY    . BACK SURGERY    . CHOLECYSTECTOMY N/A 01/29/2014   Procedure: LAPAROSCOPIC CHOLECYSTECTOMY;  Surgeon: Scherry Ran, MD;  Location: [facility];  Service: General;  Laterality: N/A;  . COLONOSCOPY N/A 10/27/2015   Procedure: COLONOSCOPY;  Surgeon: Rogene Houston, MD;  Location[facility];  Service: Endoscopy;  Laterality: N/A;  215  . INTRAMEDULLARY (IM) NAIL INTERTROCHANTERIC Right 11/29/2018   Procedure: INTRAMEDULLARY (IM) NAIL INTERTROCHANTRIC FRACTURE;  Surgeon: Renette Butters, MD;  Location: [facility];  Service: Orthopedics;  Laterality: Right;  . TONSILLECTOMY    .  YAG LASER APPLICATION Left [id number]   Procedure: YAG LASER APPLICATION;  Surgeon: Elta Guadeloupe T. Gershon Crane, MD;  Location: [facility];  Service: Ophthalmology;  Laterality:  Left;  left    Family History  Problem Relation Age of Onset  . Hypertension Child     Social History   Socioeconomic History  . Marital status: Widowed    Spouse name: Not on file  . Number of children: Not on file  . Years of education: Not on file  . Highest education level: Not on file  Occupational History  . Occupation: retired  Tobacco Use  . Smoking status: Never Smoker  . Smokeless tobacco: Never Used  Substance and Sexual Activity  . Alcohol use: No  . Drug use: No  . Sexual activity: Yes    Birth control/protection: Surgical  Other Topics Concern  . Not on file  Social History Narrative   Pt lives in an apartment complex, reports that she still drives   Social Determinants of Health   Financial Resource Strain: Low Risk   . Difficulty of Paying Living Expenses: Not hard at all  Food Insecurity: No Food Insecurity  . Worried About Charity fundraiser in the Last Year: Never true  . Ran Out of Food in the Last Year: Never true  Transportation Needs: No Transportation Needs  . Lack of Transportation (Medical): No  . Lack of Transportation (Non-Medical): No  Physical Activity: Sufficiently Active  . Days of Exercise per Week: 5 days  . Minutes of Exercise per Session: 30 min  Stress: No Stress Concern Present  . Feeling of Stress : Not at all  Social Connections: Slightly Isolated  . Frequency of Communication with Friends and Family: More than three times a week  . Frequency of Social Gatherings with Friends and Family: More than three times a week  . Attends Religious Services: More than 4 times per year  . Active Member of Clubs or Organizations: Yes  . Attends Archivist Meetings: More than 4 times per year  . Marital Status: Widowed  Intimate Partner Violence: Not At Risk  . Fear of Current or Ex-Partner: No  . Emotionally Abused: No  . Physically Abused: No  . Sexually Abused: No    Outpatient Medications Prior to Visit  Medication  Sig Dispense Refill  . acetaminophen (TYLENOL) 325 MG tablet Take 650 mg by mouth every 6 (six) hours as needed.    Marland Kitchen alendronate (FOSAMAX) 70 MG tablet Take 1 tablet (70 mg total) by mouth once a week. Take with a full glass of water on an empty stomach. 4 tablet 0  . amiodarone (PACERONE) 200 MG tablet Take 1 tablet (200 mg total) by mouth daily. Planning for 200mg  BID for 1 month and then 200 mg daily, as per cardiology recommendations. 60 tablet 1  . apixaban (ELIQUIS) 5 MG TABS tablet Take 1 tablet (5 mg total) by mouth 2 (two) times daily. 60 tablet 5  . benzonatate (TESSALON PERLES) 100 MG capsule Take 2 capsules (200 mg total) by mouth every 8 (eight) hours as needed (severe coughing spells). 30 capsule 0  . calcium-vitamin D (OS-CAL 500 + D) 500-200 MG-UNIT tablet Take 1 tablet by mouth 2 (two) times daily.    Marland Kitchen diltiazem (CARDIZEM CD) 180 MG 24 hr capsule Take 1 capsule (180 mg total) by mouth daily. 30 capsule 2  . magnesium oxide (MAG-OX) 400 MG tablet Take 1 tablet (400 mg total) by mouth 3 (three) times  daily. 90 tablet 0  . memantine (NAMENDA) 5 MG tablet Take 1 tablet (5 mg total) by mouth 2 (two) times daily. 60 tablet 0  . metoprolol tartrate (LOPRESSOR) 25 MG tablet Take 1 tablet (25 mg total) by mouth 2 (two) times daily. 60 tablet 5  . pantoprazole (PROTONIX) 40 MG tablet TAKE (1) TABLET BY MOUTH DAILY. 30 tablet 0  . Polyethyl Glycol-Propyl Glycol (SYSTANE OP) Place 1 drop into both eyes daily as needed. Dry Eyes     . QUEtiapine (SEROQUEL) 25 MG tablet Take 1 tablet (25 mg total) by mouth at bedtime. 30 tablet 0  . sertraline (ZOLOFT) 50 MG tablet Take 1 tablet (50 mg total) by mouth every morning. 30 tablet 0  . torsemide (DEMADEX) 20 MG tablet Take 1/2   tablet by mouth daily.  If your weight goes up as much as 3 pounds take 2 daily 90 tablet 3   No facility-administered medications prior to visit.    Allergies  Allergen Reactions  . Penicillins Anaphylaxis    Did it  involve swelling of the face/tongue/throat, SOB, or low BP? Unknown Did it involve sudden or severe rash/hives, skin peeling, or any reaction on the inside of your mouth or nose? Unknown Did you need to seek medical attention at a hospital or doctor's office? Unknown When did it last happen? If all above answers are "NO", may proceed with cephalosporin use.   . Biaxin [Clarithromycin]   . Ciprofloxacin Nausea And Vomiting  . Emetrol Nausea Only    swelling  . Feldene [Piroxicam] Nausea And Vomiting  . Sulfa Antibiotics     swelling    ROS Review of Systems  Cardiovascular:       Pacerone-200mg  BID-200mg  daily-sees cardiology next week-pt feeling better after fluid removed /eliquis/cardizem Lopressor demadex prn  Gastrointestinal:       GERD-stable-protonix  Musculoskeletal:       Fosamax   Psychiatric/Behavioral:       Dementia-namenda Insomnia-seroquel Depression-zoloft      Objective:    Physical Exam  Constitutional: She is oriented to person, place, and time. She appears well-developed and well-nourished.  HENT:  Head: Normocephalic and atraumatic.  Eyes: Conjunctivae are normal.  Cardiovascular: Normal rate, regular rhythm, normal heart sounds and intact distal pulses.  Pulmonary/Chest: Effort normal.  Musculoskeletal:        General: Edema present. Normal range of motion.  Neurological: She is oriented to person, place, and time.  Psychiatric: She has a normal mood and affect. Her behavior is normal.    BP 104/62 (BP Location: Left Arm, Patient Position: Sitting, Cuff Size: Normal)   Pulse 66   Temp 98 F (36.7 C) (Temporal)   Ht 5' 4.5" (1.638 m)   Wt 127 lb 9.6 oz (57.9 kg)   SpO2 96%   BMI 21.56 kg/m  Wt Readings from Last 3 Encounters:  10/20/19 127 lb 9.6 oz (57.9 kg)  10/15/19 130 lb 3.2 oz (59.1 kg)  10/14/19 132 lb 15 oz (60.3 kg)     Lab Results  Component Value Date   TSH 1.877 09/10/2019   Lab Results  Component Value Date    WBC 6.9 10/14/2019   HGB 9.3 (L) 10/14/2019   HCT 32.0 (L) 10/14/2019   MCV 83.1 10/14/2019   PLT 220 10/14/2019   Lab Results  Component Value Date   NA 140 10/14/2019   K 3.8 10/14/2019   CO2 34 (H) 10/14/2019   GLUCOSE 97 10/14/2019  BUN 26 (H) 10/14/2019   CREATININE 1.28 (H) 10/14/2019   BILITOT 1.5 (H) 09/10/2019   ALKPHOS 74 09/10/2019   AST 26 09/10/2019   ALT 25 09/10/2019   PROT 7.5 09/10/2019   ALBUMIN 3.9 09/10/2019   CALCIUM 9.0 10/14/2019   ANIONGAP 9 10/14/2019   Lab Results  Component Value Date   CHOL 186 03/08/2018   Lab Results  Component Value Date   HDL 63 03/08/2018   Lab Results  Component Value Date   LDLCALC 106 03/08/2018   Lab Results  Component Value Date   TRIG 83 03/08/2018   No results found for: [facility] Lab Results  Component Value Date   HGBA1C 5.6 08/21/2019      Assessment & Plan:  1. Atrial fibrillation with RVR (HCC) Amiodarone-recent increase in medication to BID  2. Acute on chronic diastolic congestive heart failure (H[facility]) thoracentesis 1.3L was removed.  - Basic metabolic panel Continue PT for strength-recent admission  3. GERD without esophagitis protonix 4. Osteoporosis, unspecified osteoporosis type, unspecified pathological fracture presence Fosamax-DEXA in Sept  5. Anxiety and depression seroquel/zoloft  6. Anemia, unspecified type Repeat-slightly less while hospitalized, pt eating well - CBC w/Diff/Platelet  Follow-up: bmp/cbc   Ameerah Huffstetler Hannah Beat, MD

## 2019-10-20 NOTE — Telephone Encounter (Signed)
Kristy Mckinney has been informed ok to resume

## 2019-10-20 NOTE — Patient Instructions (Addendum)
labwork in 2 weeks DEXA in Sept    If you have lab work done today you will be contacted with your lab results within the next 2 weeks.  If you have not heard from [place] then please contact us. The fastest way to get your results is to register for My Chart.   IF you received an x-ray today, you will receive an invoice from [facility]. Please contact [facility] at [phone] with questions or concerns regarding your invoice.   IF you received labwork today, you will receive an invoice from [facility]. Please contact [facility] at [phone] with questions or concerns regarding your invoice.   Our billing staff will not be able to assist you with questions regarding bills from these companies.  You will be contacted with the lab results as soon as they are available. The fastest way to get your results is to activate your My Chart account. Instructions are located on the last page of this paperwork. If you have not heard from [place] regarding the results in 2 weeks, please contact this office.

## 2019-10-28 DIAGNOSIS — I5033 Acute on chronic diastolic (congestive) heart failure: Secondary | ICD-10-CM | POA: Diagnosis not present

## 2019-10-28 DIAGNOSIS — D649 Anemia, unspecified: Secondary | ICD-10-CM | POA: Diagnosis not present

## 2019-10-29 ENCOUNTER — Other Ambulatory Visit: Payer: Self-pay | Admitting: Family Medicine

## 2019-10-29 DIAGNOSIS — D649 Anemia, unspecified: Secondary | ICD-10-CM

## 2019-10-29 NOTE — Progress Notes (Signed)
Anemia noted on labwork-add iron,ferritin, tibc

## 2019-10-30 ENCOUNTER — Telehealth: Payer: Self-pay

## 2019-10-30 LAB — CBC WITH DIFFERENTIAL/PLATELET
Absolute Monocytes: 603 cells/uL (ref 200–950)
Basophils Absolute: 41 cells/uL (ref 0–200)
Basophils Relative: 0.7 %
Eosinophils Absolute: 151 cells/uL (ref 15–500)
Eosinophils Relative: 2.6 %
HCT: 32.4 % — ABNORMAL LOW (ref 35.0–45.0)
Hemoglobin: 9.8 g/dL — ABNORMAL LOW (ref 11.7–15.5)
Lymphs Abs: 893 cells/uL (ref 850–3900)
MCH: 24 pg — ABNORMAL LOW (ref 27.0–33.0)
MCHC: 30.2 g/dL — ABNORMAL LOW (ref 32.0–36.0)
MCV: 79.2 fL — ABNORMAL LOW (ref 80.0–100.0)
MPV: 10.4 fL (ref 7.5–12.5)
Monocytes Relative: 10.4 %
Neutro Abs: 4112 cells/uL (ref 1500–7800)
Neutrophils Relative %: 70.9 %
Platelets: 271 10*3/uL (ref 140–400)
RBC: 4.09 10*6/uL (ref 3.80–5.10)
RDW: 15.5 % — ABNORMAL HIGH (ref 11.0–15.0)
Total Lymphocyte: 15.4 %
WBC: 5.8 10*3/uL (ref 3.8–10.8)

## 2019-10-30 LAB — BASIC METABOLIC PANEL WITH GFR
BUN/Creatinine Ratio: 25 (calc) — ABNORMAL HIGH (ref 6–22)
BUN: 34 mg/dL — ABNORMAL HIGH (ref 7–25)
CO2: 32 mmol/L (ref 20–32)
Calcium: 9.6 mg/dL (ref 8.6–10.4)
Chloride: 102 mmol/L (ref 98–110)
Creat: 1.34 mg/dL — ABNORMAL HIGH (ref 0.60–0.88)
Glucose, Bld: 103 mg/dL (ref 65–139)
Potassium: 5 mmol/L (ref 3.5–5.3)
Sodium: 141 mmol/L (ref 135–146)

## 2019-10-30 LAB — IRON,TIBC AND FERRITIN PANEL
%SAT: 5 % — ABNORMAL LOW (ref 16–45)
Ferritin: 60 ng/mL (ref 16–288)
Iron: 22 ug/dL — ABNORMAL LOW (ref 45–160)
TIBC: 412 ug/dL (ref 250–450)

## 2019-10-30 LAB — TEST AUTHORIZATION

## 2019-10-30 NOTE — Telephone Encounter (Signed)
RN returned telephone call to NP Bradd Canary at [facility].  Arbie Cookey asking for update regarding patient. SW and RN provided update on patient. Arbie Cookey requested palliative care team visit patient twice monthly if patient will allow. RN informed NP palliative care team will be glad to visit patient if patient is in agreement. Patient has had 2 recent hospitalizations and had to have thoracentesis complete it at 1 hospitalization. Patient has heart disease. Arbie Cookey informed patient is a full code and will likely continue to go to the ER for symptoms. Arbie Cookey informed palliative care team will request patient reach out to palliative care team prior to going to hospital. Palliative care team will also attempt to come up with a protocol for patient and leave in the home for patient to try prior to going to the ED. Arbie Cookey appreciative of call back and information on patient. Arbie Cookey informed to call with questions or concerns regarding patient.

## 2019-11-03 ENCOUNTER — Other Ambulatory Visit: Payer: Self-pay | Admitting: Family Medicine

## 2019-11-03 ENCOUNTER — Telehealth: Payer: Self-pay

## 2019-11-03 ENCOUNTER — Encounter (INDEPENDENT_AMBULATORY_CARE_PROVIDER_SITE_OTHER): Payer: Self-pay | Admitting: Gastroenterology

## 2019-11-03 DIAGNOSIS — D649 Anemia, unspecified: Secondary | ICD-10-CM

## 2019-11-03 NOTE — Progress Notes (Signed)
Take iron daily. Can use over the counter medication-MV with iron . Iron can cause constipation. Pt needs to see GI as slowly worsening anemia-microcytic low iron on labwork

## 2019-11-03 NOTE — Telephone Encounter (Signed)
Telephone call to patient to schedule palliative care visit.  Patient did not answer phone, RN left message requesting call back to schedule palliative care visit. 

## 2019-11-04 ENCOUNTER — Telehealth: Payer: Self-pay

## 2019-11-04 NOTE — Telephone Encounter (Signed)
Telephone call to schedule palliative care visit.  Patient states she is leaving for the beach tomorrow and will not be back until 11/10/19.  Patient requests RN call back the first week in May to schedule palliative care visit.

## 2019-11-05 ENCOUNTER — Telehealth: Payer: Self-pay

## 2019-11-05 NOTE — Telephone Encounter (Signed)
Telephone call to NP Bradd Canary with [facility].  RN updated Arbie Cookey that palliative care team attempted to schedule visit with patient for this week.  Arbie Cookey informed patient informed RN she was going to [employer] and would out of town for over a week.  Carol verbalizes appreciation for update.

## 2019-11-07 DIAGNOSIS — I4819 Other persistent atrial fibrillation: Secondary | ICD-10-CM | POA: Diagnosis not present

## 2019-11-10 ENCOUNTER — Other Ambulatory Visit: Payer: Self-pay | Admitting: Family Medicine

## 2019-11-12 ENCOUNTER — Other Ambulatory Visit: Payer: PPO

## 2019-11-12 ENCOUNTER — Other Ambulatory Visit: Payer: Self-pay | Admitting: Family Medicine

## 2019-11-12 ENCOUNTER — Other Ambulatory Visit: Payer: Self-pay

## 2019-11-12 DIAGNOSIS — Z515 Encounter for palliative care: Secondary | ICD-10-CM

## 2019-11-12 NOTE — Progress Notes (Signed)
PATIENT NAME: MAGDALEN SPILLER DOB: 09-26-1931 MRN: [id number]  PRIMARY CARE PROVIDER: [name], MD  RESPONSIBLE PARTY:  Acct ID - Guarantor Home Phone Work Phone Relationship Acct Type  [id number] Webb [phone]  Self P/F     [address], [place], [place] [address]    PLAN OF CARE and INTERVENTIONS:               1.  GOALS OF CARE/ ADVANCE CARE PLANNING:  Remain in home and remain independent for as long as possible.               2.  PATIENT/CAREGIVER EDUCATION:  Education on s/s of CHF exacerbation, education on s/s of infection, review meds, support               3.  DISEASE STATUS:  RN made scheduled palliative care home visit. Patient was sitting at table in living room working on a puzzle when RN arrived. Patient reports she returned home from the beach around 8 PM last evening. Patient reports she did not rest well last night and feels tired this morning. Patient complaining of pain in her back and currently rates pain at 3 on pain scale. Patient reports she takes acetaminophen as needed for discomfort and does get relief.  Patient reports she has suffered with chronic back pain for a long time.  Patient is no longer using oxygen however patient does have shortness of breath on exertion.  Patient states she is going to call and have O2 picked up.  Patient continues to go out and drive and has a hair appointment later today. Patient's current weight is 134.8 lbs. Patient reports MD has instructed her to take whole tablet of torsemide if her weight goes up to more than 3 pounds. Patient states she has had to do take extra torsemide once since MD instructed her to do this.  Patient denies having any cough. Patient reports her appetite is good. Patient has 1 + edema in her lower extremities. Education to patient to keep lower extremities elevated. Patient reports she has MD appointment 11/28/19. Patient states Meals on Wheels has not begin bringing meals. Nurse will update social  worker to follow up on Meals on Wheels request. Patient's vital signs are stable. Patient's breath sounds are clear throughout. Patients O2 SATs on room are currently 95%. Patient continues to live alone and children call and check on her frequently. Patient reports she will see her daughter's over the weekend. RN scheduled home visit with patient 12/03/19.  Patient requests RN call and confirm appointment to make sure she will be home.  Patient remains in agreement with palliative care services. RN encouraged patient to reach out to palliative care if she has increased shortness of breath, weight gain, cough or any other unmanaged symptoms.  Patient verbalizes understanding.   HISTORY OF PRESENT ILLNESS: Patient is a 86 year old female who resides in her home, family is supportive.  Patient is followed by palliative care and is seen monthly and PRN.      CODE STATUS: Full Code  ADVANCED DIRECTIVES: Y MOST FORM: Yes PPS: 60%   PHYSICAL EXAM:   VITALS: Today's Vitals   11/12/19 1005  PainSc: 3   PainLoc: Back    LUNGS: clear to auscultation  CARDIAC: Cor RRR  EXTREMITIES: 1+ edema SKIN: no visible open areas of skin breakdown  NEURO: positive for memory problems       Nilda Simmer, RN

## 2019-11-20 ENCOUNTER — Other Ambulatory Visit: Payer: Self-pay | Admitting: Family Medicine

## 2019-11-26 ENCOUNTER — Other Ambulatory Visit: Payer: Self-pay

## 2019-11-26 ENCOUNTER — Telehealth: Payer: Self-pay | Admitting: Family Medicine

## 2019-11-26 DIAGNOSIS — M810 Age-related osteoporosis without current pathological fracture: Secondary | ICD-10-CM

## 2019-11-26 DIAGNOSIS — I10 Essential (primary) hypertension: Secondary | ICD-10-CM

## 2019-11-26 DIAGNOSIS — F0280 Dementia in other diseases classified elsewhere without behavioral disturbance: Secondary | ICD-10-CM

## 2019-11-26 DIAGNOSIS — K219 Gastro-esophageal reflux disease without esophagitis: Secondary | ICD-10-CM

## 2019-11-26 DIAGNOSIS — I4891 Unspecified atrial fibrillation: Secondary | ICD-10-CM

## 2019-11-26 MED ORDER — DILTIAZEM HCL ER COATED BEADS 180 MG PO CP24: 180.0000 mg | ORAL_CAPSULE | Freq: Every day | ORAL | 0 refills | Status: AC

## 2019-11-26 MED ORDER — METOPROLOL TARTRATE 25 MG PO TABS: ORAL_TABLET | ORAL | 0 refills | Status: DC

## 2019-11-26 MED ORDER — MEMANTINE HCL 5 MG PO TABS: 5.0000 mg | ORAL_TABLET | Freq: Two times a day (BID) | ORAL | 0 refills | Status: AC

## 2019-11-26 MED ORDER — PANTOPRAZOLE SODIUM 40 MG PO TBEC: DELAYED_RELEASE_TABLET | ORAL | 0 refills | Status: AC

## 2019-11-26 MED ORDER — APIXABAN 5 MG PO TABS: 5.0000 mg | ORAL_TABLET | Freq: Two times a day (BID) | ORAL | 0 refills | Status: DC

## 2019-11-26 MED ORDER — ALENDRONATE SODIUM 70 MG PO TABS: 70.0000 mg | ORAL_TABLET | ORAL | 0 refills | Status: DC

## 2019-11-26 NOTE — Telephone Encounter (Signed)
Patient daughter Hoyle Sauer is calling and requesting refills on the following prescriptions through July as she can not get in to see her new primary doctor right now and she will be out of town so she is trying to make sure she will have her medications.    alendronate (FOSAMAX) 70 MG tablet   apixaban (ELIQUIS) 5 MG TABS tablet   metoprolol tartrate (LOPRESSOR) 25 MG tablet   diltiazem (CARDIZEM CD) 180 MG 24 hr capsule  memantine (NAMENDA) 5 MG tablet  pantoprazole (PROTONIX) 40 MG tablet   [facility] - [place], [place] - [place] Phone:  [phone]  Fax:  [phone]

## 2019-11-26 NOTE — Telephone Encounter (Signed)
Sent in

## 2019-11-28 ENCOUNTER — Encounter: Payer: Self-pay | Admitting: Cardiology

## 2019-11-28 ENCOUNTER — Ambulatory Visit: Payer: PPO | Admitting: Cardiology

## 2019-11-28 NOTE — Progress Notes (Deleted)
Cardiology Office Note  Date: 11/28/2019   ID: Kristy Mckinney, DOB 1931-12-05, MRN [id number]  PCP:  [name], MD  Cardiologist:  Rozann Lesches, MD Electrophysiologist:  None   No chief complaint on file.   History of Present Illness: Kristy Mckinney is an 86 y.o. female last seen in March by Ms. Grigoryan PA-C.  Past Medical History:  Diagnosis Date  . Abdominal distension (gaseous)   . Acute on chronic diastolic heart failure ([place])   . Anxiety   . Atrial fibrillation ([place])   . Chronic back pain   . Dementia ([place])   . Disorder of left temporomandibular joint   . Dysphagia 05/03/2016  . Essential hypertension   . GERD (gastroesophageal reflux disease)   . History of bronchitis   . Iron deficiency anemia   . Irritable bowel syndrome with diarrhea   . Osteoporosis   . Pneumonia     Past Surgical History:  Procedure Laterality Date  . ABDOMINAL HYSTERECTOMY    . BACK SURGERY    . CHOLECYSTECTOMY N/A 01/29/2014   Procedure: LAPAROSCOPIC CHOLECYSTECTOMY;  Surgeon: Scherry Ran, MD;  Location: [facility];  Service: General;  Laterality: N/A;  . COLONOSCOPY N/A 10/27/2015   Procedure: COLONOSCOPY;  Surgeon: Rogene Houston, MD;  [facility];  Service: Endoscopy;  Laterality: N/A;  215  . INTRAMEDULLARY (IM) NAIL INTERTROCHANTERIC Right 11/29/2018   Procedure: INTRAMEDULLARY (IM) NAIL INTERTROCHANTRIC FRACTURE;  Surgeon: Renette Butters, MD;  Location: [facility];  Service: Orthopedics;  Laterality: Right;  . TONSILLECTOMY    . YAG LASER APPLICATION Left [id number]   Procedure: YAG LASER APPLICATION;  Surgeon: Elta Guadeloupe T. Gershon Crane, MD;  Location: [facility];  Service: Ophthalmology;  Laterality: Left;  left    Current Outpatient Medications  Medication Sig Dispense Refill  . acetaminophen (TYLENOL) 325 MG tablet Take 650 mg by mouth every 6 (six) hours as needed.    Marland Kitchen alendronate (FOSAMAX) 70 MG tablet Take 1 tablet (70 mg total) by mouth once a week. Take  with a full glass of water on an empty stomach. 4 tablet 0  . amiodarone (PACERONE) 200 MG tablet Take 1 tablet (200 mg total) by mouth daily. Planning for 200mg  BID for 1 month and then 200 mg daily, as per cardiology recommendations. 60 tablet 1  . apixaban (ELIQUIS) 5 MG TABS tablet Take 1 tablet (5 mg total) by mouth 2 (two) times daily. 180 tablet 0  . benzonatate (TESSALON PERLES) 100 MG capsule Take 2 capsules (200 mg total) by mouth every 8 (eight) hours as needed (severe coughing spells). 30 capsule 0  . calcium-vitamin D (OS-CAL 500 + D) 500-200 MG-UNIT tablet Take 1 tablet by mouth 2 (two) times daily.    Marland Kitchen diltiazem (CARDIZEM CD) 180 MG 24 hr capsule Take 1 capsule (180 mg total) by mouth daily. 90 capsule 0  . magnesium oxide (MAG-OX) 400 MG tablet Take 1 tablet (400 mg total) by mouth 3 (three) times daily. 90 tablet 0  . memantine (NAMENDA) 5 MG tablet Take 1 tablet (5 mg total) by mouth 2 (two) times daily. 180 tablet 0  . metoprolol tartrate (LOPRESSOR) 25 MG tablet TAKE (1) TABLET BY MOUTH TWICE DAILY. 180 tablet 0  . pantoprazole (PROTONIX) 40 MG tablet TAKE (1) TABLET BY MOUTH DAILY. 90 tablet 0  . Polyethyl Glycol-Propyl Glycol (SYSTANE OP) Place 1 drop into both eyes daily as needed. Dry Eyes     . QUEtiapine (  SEROQUEL) 25 MG tablet Take 1 tablet (25 mg total) by mouth at bedtime. 30 tablet 0  . sertraline (ZOLOFT) 50 MG tablet Take 1 tablet (50 mg total) by mouth every morning. 30 tablet 0  . torsemide (DEMADEX) 20 MG tablet Take 1/2   tablet by mouth daily.  If your weight goes up as much as 3 pounds take 2 daily 90 tablet 3   No current facility-administered medications for this visit.   Allergies:  Penicillins, Biaxin [clarithromycin], Ciprofloxacin, Emetrol, Feldene [piroxicam], and Sulfa antibiotics   ROS:  Please see the history of present illness. Otherwise, complete review of systems is positive for {NONE DEFAULTED:18576::"none"}.  All other systems are reviewed and  negative.   Physical Exam: VS:  There were no vitals taken for this visit., BMI There is no height or weight on file to calculate BMI.  Wt Readings from Last 3 Encounters:  10/20/19 127 lb 9.6 oz (57.9 kg)  10/15/19 130 lb 3.2 oz (59.1 kg)  10/14/19 132 lb 15 oz (60.3 kg)    General: Patient appears comfortable at rest. HEENT: Conjunctiva and lids normal, oropharynx clear with moist mucosa. Neck: Supple, no elevated JVP or carotid bruits, no thyromegaly. Lungs: Clear to auscultation, nonlabored breathing at rest. Cardiac: Regular rate and rhythm, no S3 or significant systolic murmur, no pericardial rub. Abdomen: Soft, nontender, no hepatomegaly, bowel sounds present, no guarding or rebound. Extremities: No pitting edema, distal pulses 2+. Skin: Warm and dry. Musculoskeletal: No kyphosis. Neuropsychiatric: Alert and oriented x3, affect grossly appropriate.  ECG:  An ECG dated 10/12/2019 was personally reviewed today and demonstrated:  Rate controlled atrial fibrillation with left anterior fascicular block and nonspecific T wave changes.  Recent Labwork: 09/10/2019: ALT 25; AST 26; TSH 1.877 10/12/2019: B Natriuretic Peptide 915.0 10/14/2019: Magnesium 1.9 10/28/2019: BUN 34; Creat 1.34; Hemoglobin 9.8; Platelets 271; Potassium 5.0; Sodium 141     Component Value Date/Time   CHOL 186 03/08/2018 0000   TRIG 83 03/08/2018 0000   HDL 63 03/08/2018 0000   LDLCALC 106 03/08/2018 0000    Other Studies Reviewed Today:  Echocardiogram 04/04/2019: 1. Left ventricular ejection fraction, by visual estimation, is 70 to  75%. The left ventricle has hyperdynamic function. Decreased left  ventricular size. There is moderately increased left ventricular  hypertrophy.  2. Left ventricular diastolic Doppler parameters are indeterminate  pattern of LV diastolic filling.  3. Right ventricular volume and pressure overload.  4. Global right ventricle has normal systolic function.The right   ventricular size is moderately enlarged. Mildly increased right  ventricular wall thickness.  5. Left atrial size was severely dilated.  6. Right atrial size was normal.  7. Presence of pericardial fat pad.  8. Moderate aortic valve annular calcification.  9. Moderate to severe mitral annular calcification.  10. Severe calcification of the mitral valve leaflet(s). Valve appears  stenotic, not completely evaluated. Suggest follow-up images with VTI  measurements or planimetry if possible.  11. The mitral valve is degenerative. Mild mitral valve regurgitation.  12. The tricuspid valve is grossly normal. Tricuspid valve regurgitation  is mild.  13. Aortic valve area, by VTI measures 1.83 cm.  14. Aortic valve mean gradient measures 8.8 mmHg.  15. The aortic valve is tricuspid Aortic valve regurgitation was not  visualized by color flow Doppler. Mild aortic valve stenosis.  16. Moderately elevated pulmonary artery systolic pressure.  17. The tricuspid regurgitant velocity is 3.67 m/s, and with an assumed  right atrial pressure of 8  mmHg, the estimated right ventricular systolic  pressure is moderately elevated at 61.9 mmHg.  18. The inferior vena cava is normal in size with <50% respiratory  variability, suggesting right atrial pressure of 8 mmHg.  19. The pulmonic valve was grossly normal. Pulmonic valve regurgitation is  trivial by color flow Doppler.   Assessment and Plan:   Medication Adjustments/Labs and Tests Ordered: Current medicines are reviewed at length with the patient today.  Concerns regarding medicines are outlined above.   Tests Ordered: No orders of the defined types were placed in this encounter.   Medication Changes: No orders of the defined types were placed in this encounter.   Disposition:  Follow up {follow up:15908}  Signed, Satira Sark, MD, [facility] 11/28/2019 8:27 AM    [facility] at [place]. [address],  [place], [place] [address] Phone: [phone]; Fax: [phone]

## 2019-12-01 ENCOUNTER — Telehealth: Payer: Self-pay

## 2019-12-01 NOTE — Telephone Encounter (Signed)
Per [facility]. Pt has had an 26 lb increase in weight.  Please call [facility] [phone]  Thanks renee

## 2019-12-01 NOTE — Telephone Encounter (Signed)
LM to call back.

## 2019-12-02 ENCOUNTER — Telehealth: Payer: Self-pay

## 2019-12-02 NOTE — Telephone Encounter (Signed)
Telephone call to schedule palliative care visit.  RN offered visit in the AM however patient refused stating she has MD appointment in [place] at 1:45 PM.

## 2019-12-03 ENCOUNTER — Other Ambulatory Visit: Payer: Self-pay

## 2019-12-03 ENCOUNTER — Encounter (INDEPENDENT_AMBULATORY_CARE_PROVIDER_SITE_OTHER): Payer: Self-pay | Admitting: Gastroenterology

## 2019-12-03 ENCOUNTER — Ambulatory Visit (INDEPENDENT_AMBULATORY_CARE_PROVIDER_SITE_OTHER): Payer: PPO | Admitting: Gastroenterology

## 2019-12-03 VITALS — BP 116/65 | HR 91 | Temp 96.9°F | Ht 64.5 in | Wt 148.3 lb

## 2019-12-03 DIAGNOSIS — D509 Iron deficiency anemia, unspecified: Secondary | ICD-10-CM | POA: Diagnosis not present

## 2019-12-03 MED ORDER — FERROUS SULFATE 325 (65 FE) MG PO TBEC: 325.0000 mg | DELAYED_RELEASE_TABLET | Freq: Every day | ORAL | 3 refills | Status: DC

## 2019-12-03 NOTE — Patient Instructions (Signed)
Start iron supplement as discussed.  I will discuss care with Dr. Laural Golden and contact you with recommendations

## 2019-12-03 NOTE — Progress Notes (Signed)
Patient profile: Kristy Mckinney is a 86 y.o. female seen for evaluation of anemia. Last seen in clinic 2017.   History of Present Illness: Kristy Mckinney is seen today for anemia. She reports not seeing any blood in her stools or dark stools. Stools are typically light brown color. She has 2-3 bowel movements a day. She has no abdominal pain.  She feels her appetite is good. She has very occasional nausea but denies any GERD symptoms as long as she takes her Protonix. She has no dysphagia.  Her only complaint today is feeling fatigued and weak over the past few months.  Reports when she was in her 79s she had significant dizziness and ultimately diagnosed with anemia but did not require blood transfusion.  Unsure of source of anemia at that time  Recent past medical issues include acute respiratory failure in April 2021 with thoracentesis removing 1.3 mL fluid, PMHx of CHF, atrial fib.   Wt Readings from Last 3 Encounters:  12/03/19 148 lb 4.8 oz (67.3 kg)  10/20/19 127 lb 9.6 oz (57.9 kg)  10/15/19 130 lb 3.2 oz (59.1 kg)    Past Medical History:  Past Medical History:  Diagnosis Date  . Abdominal distension (gaseous)   . Acute on chronic diastolic heart failure ([place])   . Anxiety   . Atrial fibrillation ([place])   . Chronic back pain   . Dementia ([place])   . Disorder of left temporomandibular joint   . Dysphagia 05/03/2016  . Essential hypertension   . GERD (gastroesophageal reflux disease)   . History of bronchitis   . Iron deficiency anemia   . Irritable bowel syndrome with diarrhea   . Osteoporosis   . Pneumonia     Problem List: Patient Active Problem List   Diagnosis Date Noted  . Acute respiratory failure ([place]) 10/12/2019  . Elevated glucose 08/20/2019  . Degenerative lumbar disc 08/20/2019  . Left hip pain 08/20/2019  . Osteoporosis   . Anemia, unspecified   . Anxiety disorder, unspecified   . Vitamin D deficiency, unspecified   . Essential (primary)  hypertension   . Abdominal distension (gaseous)   . Disorder of the skin and subcutaneous tissue, unspecified   . Gastro-esophageal reflux disease with esophagitis   . Iron deficiency anemia, unspecified   . Irritable bowel syndrome with diarrhea   . Left temporomandibular joint disorder, unspecified   . Unspecified dementia without behavioral disturbance ([place])   . Unspecified fracture of unspecified wrist and hand, initial encounter for closed fracture   . Advanced care planning/counseling discussion   . Acute on chronic diastolic congestive heart failure ([place]) 05/02/2019  . Acute on chronic diastolic CHF (congestive heart failure) ([place]) 05/02/2019  . Palliative care by specialist   . Goals of care, counseling/discussion   . Generalized weakness   . Pleural effusion 04/13/2019  . Pancreatitis, acute 04/13/2019  . Acute respiratory failure with hypoxia ([place]) 04/13/2019  . Bradycardia 04/10/2019  . Hypotension 04/10/2019  . Hyperkalemia 04/10/2019  . ARF (acute renal failure) ([place]) 04/10/2019  . Atrial fibrillation ([place]) 04/06/2019  . A-fib ([place]) 04/04/2019  . Dementia ([place]) 12/12/2018  . GERD without esophagitis 12/06/2018  . Post-menopausal osteoporosis 12/06/2018  . Acute delirium 12/06/2018  . Hypomagnesemia 12/06/2018  . Acute blood loss anemia 12/06/2018  . Closed fracture of right femur, unspecified fracture morphology, initial encounter ([place]) 11/28/2018  . Acute diastolic CHF (congestive heart failure) ([place]) 11/28/2018  . Fracture, intertrochanteric, right femur ([place]) 11/28/2018  .  Atrial fibrillation with rapid ventricular response ([place]) 11/25/2018  . Atrial fibrillation with RVR ([place]) 11/24/2018  . Chronic back pain 11/24/2018  . Anxiety and depression 11/24/2018  . Chronic constipation 05/03/2016  . Dysphagia 05/03/2016  . Cholecystitis with cholelithiasis 01/29/2014    Past Surgical History: Past Surgical History:  Procedure Laterality Date  . ABDOMINAL  HYSTERECTOMY    . BACK SURGERY    . CHOLECYSTECTOMY N/A 01/29/2014   Procedure: LAPAROSCOPIC CHOLECYSTECTOMY;  Surgeon: Scherry Ran, MD;  Location: [facility];  Service: General;  Laterality: N/A;  . COLONOSCOPY N/A 10/27/2015   Procedure: COLONOSCOPY;  Surgeon: Rogene Houston, MD;  [facility];  Service: Endoscopy;  Laterality: N/A;  215  . INTRAMEDULLARY (IM) NAIL INTERTROCHANTERIC Right 11/29/2018   Procedure: INTRAMEDULLARY (IM) NAIL INTERTROCHANTRIC FRACTURE;  Surgeon: Renette Butters, MD;  Location: [facility];  Service: Orthopedics;  Laterality: Right;  . TONSILLECTOMY    . YAG LASER APPLICATION Left [id number]   Procedure: YAG LASER APPLICATION;  Surgeon: Elta Guadeloupe T. Gershon Crane, MD;  Location: [facility];  Service: Ophthalmology;  Laterality: Left;  left    Allergies: Allergies  Allergen Reactions  . Penicillins Anaphylaxis    Did it involve swelling of the face/tongue/throat, SOB, or low BP? Unknown Did it involve sudden or severe rash/hives, skin peeling, or any reaction on the inside of your mouth or nose? Unknown Did you need to seek medical attention at a hospital or doctor's office? Unknown When did it last happen? If all above answers are "NO", may proceed with cephalosporin use.   . Biaxin [Clarithromycin]   . Ciprofloxacin Nausea And Vomiting  . Emetrol Nausea Only    swelling  . Feldene [Piroxicam] Nausea And Vomiting  . Sulfa Antibiotics     swelling      Home Medications:  Current Outpatient Medications:  .  acetaminophen (TYLENOL) 325 MG tablet, Take 650 mg by mouth every 6 (six) hours as needed., Disp: , Rfl:  .  alendronate (FOSAMAX) 70 MG tablet, Take 1 tablet (70 mg total) by mouth once a week. Take with a full glass of water on an empty stomach., Disp: 4 tablet, Rfl: 0 .  amiodarone (PACERONE) 200 MG tablet, Take 1 tablet (200 mg total) by mouth daily. Planning for 200mg  BID for 1 month and then 200 mg daily, as per cardiology recommendations.,  Disp: 60 tablet, Rfl: 1 .  apixaban (ELIQUIS) 5 MG TABS tablet, Take 1 tablet (5 mg total) by mouth 2 (two) times daily., Disp: 180 tablet, Rfl: 0 .  benzonatate (TESSALON PERLES) 100 MG capsule, Take 2 capsules (200 mg total) by mouth every 8 (eight) hours as needed (severe coughing spells)., Disp: 30 capsule, Rfl: 0 .  calcium-vitamin D (OS-CAL 500 + D) 500-200 MG-UNIT tablet, Take 1 tablet by mouth 2 (two) times daily., Disp: , Rfl:  .  diltiazem (CARDIZEM CD) 180 MG 24 hr capsule, Take 1 capsule (180 mg total) by mouth daily., Disp: 90 capsule, Rfl: 0 .  magnesium oxide (MAG-OX) 400 MG tablet, Take 1 tablet (400 mg total) by mouth 3 (three) times daily., Disp: 90 tablet, Rfl: 0 .  memantine (NAMENDA) 5 MG tablet, Take 1 tablet (5 mg total) by mouth 2 (two) times daily., Disp: 180 tablet, Rfl: 0 .  metoprolol tartrate (LOPRESSOR) 25 MG tablet, TAKE (1) TABLET BY MOUTH TWICE DAILY., Disp: 180 tablet, Rfl: 0 .  pantoprazole (PROTONIX) 40 MG tablet, TAKE (1) TABLET BY MOUTH DAILY., Disp: 90 tablet,  Rfl: 0 .  Polyethyl Glycol-Propyl Glycol (SYSTANE OP), Place 1 drop into both eyes daily as needed. Dry Eyes , Disp: , Rfl:  .  QUEtiapine (SEROQUEL) 25 MG tablet, Take 1 tablet (25 mg total) by mouth at bedtime., Disp: 30 tablet, Rfl: 0 .  sertraline (ZOLOFT) 50 MG tablet, Take 1 tablet (50 mg total) by mouth every morning., Disp: 30 tablet, Rfl: 0 .  torsemide (DEMADEX) 20 MG tablet, Take 1/2   tablet by mouth daily.  If your weight goes up as much as 3 pounds take 2 daily, Disp: 90 tablet, Rfl: 3 .  ferrous sulfate 325 (65 FE) MG EC tablet, Take 1 tablet (325 mg total) by mouth daily with breakfast., Disp: 30 tablet, Rfl: 3   Family History: family history includes Hypertension in her child.    Social History:   reports that she has never smoked. She has never used smokeless tobacco. She reports that she does not drink alcohol or use drugs.   Review of Systems: Constitutional: Denies weight  loss/weight gain  Eyes: No changes in vision. ENT: No oral lesions, sore throat.  GI: see HPI.  Heme/Lymph: No easy bruising.  CV: No chest pain.  GU: No hematuria.  Integumentary: No rashes.  Neuro: No headaches.  Psych: No depression/anxiety.  Endocrine: No heat/cold intolerance.  Allergic/Immunologic: No urticaria.  Resp: No cough, SOB.  Musculoskeletal: No joint swelling.    Physical Examination: BP 116/65 (BP Location: Right Arm, Patient Position: Sitting, Cuff Size: Normal)   Pulse 91   Temp (!) 96.9 F (36.1 C) (Temporal)   Ht 5' 4.5" (1.638 m)   Wt 148 lb 4.8 oz (67.3 kg)   BMI 25.06 kg/m  Gen: NAD, alert and oriented x 4 HEENT: PEERLA, EOMI, Neck: supple, no JVD Chest: CTA bilaterally, no wheezes, crackles, or other adventitious sounds CV: RRR, no m/g/c/r Abd: soft, NT, ND, +BS in all four quadrants; no HSM, guarding, ridigity, or rebound tenderness RECTAL exam--light brown stool in rectal vault, negative hemoccult.  Ext: no edema, well perfused with 2+ pulses, Skin: no rash or lesions noted on observed skin Lymph: no noted LAD  Data Reviewed:  10/2019-Iron 22, ferritin 60, %sat 5, TIBC 412, CBC w/ hgb 9.8, MCV 79, platelet normal, BMP w/ Cr 1.34, BUN 34. INR 1.5    09/2019--Hgb 10.5, MCV normal.   05/2020--Hgb 11.1, MCV 86  11/2018-Hgb 10.9  Colonoscopy 10/2015-- Impression: - The examined portion of the ileum was normal.  - Diverticulosis in the sigmoid colon.  - External hemorrhoids.  - No specimens collected   IMPRESSION: CT 04/2019 1. Inflammatory changes centered around the pancreas and duodenum, concerning for acute pancreatitis and/or acute duodenitis. 2. Small volume of ascites. 3. Large right and moderate left pleural effusions lying dependently with some associated passive atelectasis in the lung bases bilaterally. 4. Tiny 2-3 mm nonobstructive calculi in the collecting systems of both kidneys. No ureteral stones or findings of urinary tract  obstruction are noted at this time. 5. Mild cardiomegaly. 6. Aortic atherosclerosis, in addition to left main and 3 vessel coronary artery disease. 7. There are calcifications of the aortic valve and mitral annulus. Echocardiographic correlation for evaluation of potential valvular dysfunction may be warranted if clinically indicated. 8. Moderate to large hiatal hernia. 9. Additional incidental findings, as above.    Assessment/Plan: Ms. Ottum is a 86 y.o. female seen for anemia.  1.  Anemia-chronic anemia with baseline hemoglobin around 11 one year ago and most recent  hemoglobin 9.8.  Mildly microcytic with low iron at 22.  She has not started iron supplement and we will send this to her pharmacy today.  We discussed endoscopy/colonoscopy for evaluation, she is hesitant to drink colonoscopy prep, feels she is too weak.  She would also need to stop her Eliquis prior to procedure (started 04/2019 for atrial fib).  She is Hemoccult negative on exam with no evidence of GI bleeding.   We will discuss case with Dr. Laural Golden further     Kristy Mckinney was seen today for new patient (initial visit).  Diagnoses and all orders for this visit:  Iron deficiency anemia, unspecified iron deficiency anemia type  Other orders -     ferrous sulfate 325 (65 FE) MG EC tablet; Take 1 tablet (325 mg total) by mouth daily with breakfast.       I personally performed the service, non-incident to. (WP)  Laurine Blazer, [facility]

## 2019-12-03 NOTE — Progress Notes (Signed)
Cardiology Office Note  Date: 12/04/2019   ID: Kristy Mckinney, DOB 11/19/1931, MRN [id number]  PCP:  [name], MD  Cardiologist:  Rozann Lesches, MD Electrophysiologist:  None   Chief Complaint: Shortness of breath, weight gain, lower extremity edema.  History of Present Illness: Kristy Mckinney is a 86 y.o. female with a history of persistent atrial fibrillation, chronic diastolic HF, Mild AS, Mild MS.  Last encounter with Bernerd Pho PA 09/26/2019 for hospital f/u visit after presentation to [facility] ED after fall, nausea, AMS, worsening dyspnea, weakness. Weights at home were stable around 134-136 lbs. BNP 1555. CXR R pleural effusion with subsequent thoracentesis with 1.2 L fluid removed. Atrial fib w/ RVR on admission. She was started on Amiodarone while inpatient and dc'd on 200 mg po bid x 1 month then 200 mg daily thereafter.   Recent phone call from [place] to [place] office on 12/01/2019, stating patient had gained  26 lbs.  She presents today with complaints of shortness of breath and lower extremity edema.  Weight on arrival was 147.6 pounds which is approximately 10 to 11 pounds over her baseline weight of 134 to 136 pounds.  She was previously taking torsemide 20 mg 1/2 pill as needed for weight gain of 3 pounds in 24 hours.  If weight increased as much as 3 pounds she was to take 2 half pills.  She does have some dyspnea on exertion complaining of lower extremity edema.  Her atrial fibrillation rate is controlled at 70 today on EKG.  She states she was  seen yesterday by gastroenterology for iron deficiency anemia.  States he ordered an iron pill which she has not started taking yet.  She states her hemoglobin was around 9.8 at his office.  Her baseline is usually around 11.  Past Medical History:  Diagnosis Date  . Abdominal distension (gaseous)   . Acute on chronic diastolic heart failure ([place])   . Anxiety   . Atrial fibrillation ([place])   . Chronic  back pain   . Dementia ([place])   . Disorder of left temporomandibular joint   . Dysphagia 05/03/2016  . Essential hypertension   . GERD (gastroesophageal reflux disease)   . History of bronchitis   . Iron deficiency anemia   . Irritable bowel syndrome with diarrhea   . Osteoporosis   . Pneumonia     Past Surgical History:  Procedure Laterality Date  . ABDOMINAL HYSTERECTOMY    . BACK SURGERY    . CHOLECYSTECTOMY N/A 01/29/2014   Procedure: LAPAROSCOPIC CHOLECYSTECTOMY;  Surgeon: Scherry Ran, MD;  Location: [facility];  Service: General;  Laterality: N/A;  . COLONOSCOPY N/A 10/27/2015   Procedure: COLONOSCOPY;  Surgeon: Rogene Houston, MD;  Location: [facility];  Service: Endoscopy;  Laterality: N/A;  215  . INTRAMEDULLARY (IM) NAIL INTERTROCHANTERIC Right 11/29/2018   Procedure: INTRAMEDULLARY (IM) NAIL INTERTROCHANTRIC FRACTURE;  Surgeon: Renette Butters, MD;  Location: [facility];  Service: Orthopedics;  Laterality: Right;  . TONSILLECTOMY    . YAG LASER APPLICATION Left [id number]   Procedure: YAG LASER APPLICATION;  Surgeon: Elta Guadeloupe T. Gershon Crane, MD;  Location: [facility];  Service: Ophthalmology;  Laterality: Left;  left    Current Outpatient Medications  Medication Sig Dispense Refill  . acetaminophen (TYLENOL) 325 MG tablet Take 650 mg by mouth every 6 (six) hours as needed.    Marland Kitchen alendronate (FOSAMAX) 70 MG tablet Take 1 tablet (70 mg total) by mouth once  a week. Take with a full glass of water on an empty stomach. 4 tablet 0  . amiodarone (PACERONE) 200 MG tablet Take 1 tablet (200 mg total) by mouth daily. Planning for 200mg  BID for 1 month and then 200 mg daily, as per cardiology recommendations. 60 tablet 1  . apixaban (ELIQUIS) 5 MG TABS tablet Take 1 tablet (5 mg total) by mouth 2 (two) times daily. 180 tablet 0  . benzonatate (TESSALON PERLES) 100 MG capsule Take 2 capsules (200 mg total) by mouth every 8 (eight) hours as needed (severe coughing spells). 30 capsule 0  .  calcium-vitamin D (OS-CAL 500 + D) 500-200 MG-UNIT tablet Take 1 tablet by mouth 2 (two) times daily.    Marland Kitchen diltiazem (CARDIZEM CD) 180 MG 24 hr capsule Take 1 capsule (180 mg total) by mouth daily. 90 capsule 0  . ferrous sulfate 325 (65 FE) MG EC tablet Take 1 tablet (325 mg total) by mouth daily with breakfast. 30 tablet 3  . magnesium oxide (MAG-OX) 400 MG tablet Take 1 tablet (400 mg total) by mouth 3 (three) times daily. 90 tablet 0  . memantine (NAMENDA) 5 MG tablet Take 1 tablet (5 mg total) by mouth 2 (two) times daily. 180 tablet 0  . metoprolol tartrate (LOPRESSOR) 25 MG tablet TAKE (1) TABLET BY MOUTH TWICE DAILY. 180 tablet 0  . pantoprazole (PROTONIX) 40 MG tablet TAKE (1) TABLET BY MOUTH DAILY. 90 tablet 0  . Polyethyl Glycol-Propyl Glycol (SYSTANE OP) Place 1 drop into both eyes daily as needed. Dry Eyes     . QUEtiapine (SEROQUEL) 25 MG tablet Take 1 tablet (25 mg total) by mouth at bedtime. 30 tablet 0  . sertraline (ZOLOFT) 50 MG tablet Take 1 tablet (50 mg total) by mouth every morning. 30 tablet 0  . torsemide (DEMADEX) 20 MG tablet Take 1/2   tablet by mouth daily.  If your weight goes up as much as 3 pounds take 2 daily 90 tablet 3   No current facility-administered medications for this visit.   Allergies:  Penicillins, Biaxin [clarithromycin], Ciprofloxacin, Emetrol, Feldene [piroxicam], and Sulfa antibiotics   Social History: The patient  reports that she has never smoked. She has never used smokeless tobacco. She reports that she does not drink alcohol or use drugs.   Family History: The patient's family history includes Hypertension in her child.   ROS:  Please see the history of present illness. Otherwise, complete review of systems is positive for none.  All other systems are reviewed and negative.   Physical Exam: VS:  BP (!) 106/50   Pulse 70   Ht 5' 4.5" (1.638 m)   Wt 147 lb 9.6 oz (67 kg)   SpO2 98%   BMI 24.94 kg/m , BMI Body mass index is 24.94  kg/m.  Wt Readings from Last 3 Encounters:  12/04/19 147 lb 9.6 oz (67 kg)  12/03/19 148 lb 4.8 oz (67.3 kg)  10/20/19 127 lb 9.6 oz (57.9 kg)    General: Patient appears comfortable at rest. Neck: Supple,  elevated JVP when sitting erect, no carotid bruits, no thyromegaly. Lungs: Crackles throughout, nonlabored breathing at rest. Cardiac: Irregularly irregular rate and rhythm, no S3 or significant systolic murmur, no pericardial rub. Extremities: 1+ bilateral pitting edema, distal pulses 2+. Skin: Warm and dry. Musculoskeletal: No kyphosis. Neuropsychiatric: Alert and oriented x3, affect grossly appropriate.  ECG:  An ECG dated 12/04/2019 was personally reviewed today and demonstrated:  Atrial fibrillation with a  rate of 70, left axis deviation, septal infarct age undetermined.  Recent Labwork: 09/10/2019: ALT 25; AST 26; TSH 1.877 10/12/2019: B Natriuretic Peptide 915.0 10/14/2019: Magnesium 1.9 10/28/2019: BUN 34; Creat 1.34; Hemoglobin 9.8; Platelets 271; Potassium 5.0; Sodium 141     Component Value Date/Time   CHOL 186 03/08/2018 0000   TRIG 83 03/08/2018 0000   HDL 63 03/08/2018 0000   LDLCALC 106 03/08/2018 0000    Other Studies Reviewed Today:  Echocardiogram: 03/2019 IMPRESSIONS  1. Left ventricular ejection fraction, by visual estimation, is 70 to  75%. The left ventricle has hyperdynamic function. Decreased left  ventricular size. There is moderately increased left ventricular  hypertrophy.  2. Left ventricular diastolic Doppler parameters are indeterminate  pattern of LV diastolic filling.  3. Right ventricular volume and pressure overload.  4. Global right ventricle has normal systolic function.The right  ventricular size is moderately enlarged. Mildly increased right  ventricular wall thickness.  5. Left atrial size was severely dilated.  6. Right atrial size was normal.  7. Presence of pericardial fat pad.  8. Moderate aortic valve annular  calcification.  9. Moderate to severe mitral annular calcification.  10. Severe calcification of the mitral valve leaflet(s). Valve appears  stenotic, not completely evaluated. Suggest follow-up images with VTI  measurements or planimetry if possible.  11. The mitral valve is degenerative. Mild mitral valve regurgitation.  12. The tricuspid valve is grossly normal. Tricuspid valve regurgitation  is mild.  13. Aortic valve area, by VTI measures 1.83 cm.  14. Aortic valve mean gradient measures 8.8 mmHg.  15. The aortic valve is tricuspid Aortic valve regurgitation was not  visualized by color flow Doppler. Mild aortic valve stenosis.  16. Moderately elevated pulmonary artery systolic pressure.  17. The tricuspid regurgitant velocity is 3.67 m/s, and with an assumed  right atrial pressure of 8 mmHg, the estimated right ventricular systolic  pressure is moderately elevated at 61.9 mmHg.  18. The inferior vena cava is normal in size with <50% respiratory  variability, suggesting right atrial pressure of 8 mmHg.  19. The pulmonic valve was grossly normal. Pulmonic valve regurgitation is  trivial by color flow Doppler.   Assessment and Plan:  1. Persistent atrial fibrillation ([place])   2. Chronic diastolic congestive heart failure (HCC)   3. Valvular heart disease    1. Persistent atrial fibrillation ([place]) She continues in atrial fibrillation with rate control.  Currently taking amiodarone 200 mg daily.  Continue amiodarone 200 mg daily, continue Eliquis 5 mg p.o. twice daily.  Continue Cardizem 180 mg daily.  2. Chronic diastolic congestive heart failure ([place]) Patient has had a recent weight gain of approximately 11.5 pounds from baseline of 134 to 136 pounds up to a weight of 147.6 today.  She has elevated JVD when sitting erect in the chair.  She has 2+ lower extremity edema.  Increase torsemide to 20 mg p.o. twice daily for the next 7 days.  Add potassium 10 mEq daily during increased  dosages of torsemide. Return in 1 week for follow-up.  We will need to get a BMP and magnesium on return for follow-up.  3. Valvular heart disease  Evidence on most recent echo of mild mitral regurgitation, mild tricuspid regurgitation, mild aortic valve stenosis, right ventricular systolic pressure moderately elevated at 61.9.  Trivial pulmonic valve regurgitation.   Medication Adjustments/Labs and Tests Ordered: Current medicines are reviewed at length with the patient today.  Concerns regarding medicines are outlined above.  Disposition: Follow-up with Dr. Domenic Polite or APP 1 week   Signed, Levell July, NP 12/04/2019 2:26 PM    [facility] [place], [place], [place] [address] Phone: [phone]; Fax: [phone]

## 2019-12-04 ENCOUNTER — Ambulatory Visit (INDEPENDENT_AMBULATORY_CARE_PROVIDER_SITE_OTHER): Payer: PPO | Admitting: Family Medicine

## 2019-12-04 ENCOUNTER — Other Ambulatory Visit (INDEPENDENT_AMBULATORY_CARE_PROVIDER_SITE_OTHER): Payer: Self-pay | Admitting: *Deleted

## 2019-12-04 ENCOUNTER — Encounter: Payer: Self-pay | Admitting: Family Medicine

## 2019-12-04 VITALS — BP 106/50 | HR 70 | Ht 64.5 in | Wt 147.6 lb

## 2019-12-04 DIAGNOSIS — D509 Iron deficiency anemia, unspecified: Secondary | ICD-10-CM

## 2019-12-04 DIAGNOSIS — I38 Endocarditis, valve unspecified: Secondary | ICD-10-CM

## 2019-12-04 DIAGNOSIS — I4819 Other persistent atrial fibrillation: Secondary | ICD-10-CM | POA: Diagnosis not present

## 2019-12-04 DIAGNOSIS — I5032 Chronic diastolic (congestive) heart failure: Secondary | ICD-10-CM

## 2019-12-04 MED ORDER — POTASSIUM CHLORIDE ER 10 MEQ PO TBCR
10.0000 meq | EXTENDED_RELEASE_TABLET | Freq: Every day | ORAL | 0 refills | Status: DC
Start: 2019-12-04 — End: 2019-12-05

## 2019-12-04 MED ORDER — TORSEMIDE 20 MG PO TABS: 20.0000 mg | ORAL_TABLET | Freq: Two times a day (BID) | ORAL | 0 refills | Status: DC

## 2019-12-04 NOTE — Patient Instructions (Signed)
Medication Instructions:   Your physician has recommended you make the following change in your medication:   Take torsemide 20 mg by mouth twice daily for 7 days-further instructions given at follow up visit  Start potassium 10 meq by mouth daily for 7 days-further instructions given at follow up visit  Continue other medications the same  Labwork:  NONE  Testing/Procedures:  NONE  Follow-Up:  Your physician recommends that you schedule a follow-up appointment in: 1 week (office)  Any Other Special Instructions Will Be Listed Below (If Applicable). Your physician recommends that you weigh, daily, at the same time every day, and in the same amount of clothing. Please record your daily weights and bring it to your next appointment.   If you need a refill on your cardiac medications before your next appointment, please call your pharmacy.

## 2019-12-05 ENCOUNTER — Telehealth: Payer: Self-pay | Admitting: Cardiology

## 2019-12-05 MED ORDER — POTASSIUM CHLORIDE ER 10 MEQ PO TBCR: 10.0000 meq | EXTENDED_RELEASE_TABLET | Freq: Every day | ORAL | 0 refills | Status: DC

## 2019-12-05 NOTE — Telephone Encounter (Signed)
Almyra Free -granddaughter -called stating that she needs to know if patient needs to increase her lasix or go the ER.   Please call [phone]  Patient is very fatigue and swollen.

## 2019-12-05 NOTE — Telephone Encounter (Signed)
Pt's daughter called stating the pt was seen yesterday and has a weight gain of 15lbs over her normal weight and she's wanting to know if pt needs to be admitted to have IV's to help get the fluid off  Please call [facility] @ [phone]

## 2019-12-05 NOTE — Telephone Encounter (Signed)
Home weight today 149 lbs Home weight yesterday 149 lbs Reports symptoms are not worse Reports taking torsemide 20 mg last night and this morning and has been voiding a lot today.   Medication instructions reviewed with grand-daughter (Take torsemide 20 mg by mouth twice daily for 7 days-further instructions given at follow up visit Start potassium 10 meq by mouth daily for 7 days-further instructions given at follow up visit)  Advised if symptoms get worse, to go to the ED. Verbalized understanding.

## 2019-12-07 DIAGNOSIS — I4819 Other persistent atrial fibrillation: Secondary | ICD-10-CM | POA: Diagnosis not present

## 2019-12-08 NOTE — Telephone Encounter (Signed)
Continue as per instructions and go to ER if symptoms become worse as you mentioned. Thanks

## 2019-12-09 NOTE — Telephone Encounter (Signed)
Pt seen at the [place] office on 12/04/19 by Katina Dung, NP

## 2019-12-10 ENCOUNTER — Ambulatory Visit: Payer: PPO | Admitting: Family Medicine

## 2019-12-11 NOTE — Progress Notes (Signed)
Cardiology Office Note  Date: 12/12/2019   ID: Kristy Mckinney, DOB Nov 21, 1931, MRN [id number]  PCP:  Celene Squibb, MD  Cardiologist:  Rozann Lesches, MD Electrophysiologist:  None   Chief Complaint: Shortness of breath, weight gain, lower extremity edema.  History of Present Illness: Kristy Mckinney is a 86 y.o. female with a history of persistent atrial fibrillation, chronic diastolic HF, Mild AS, Mild MS.  Last encounter with Kristy Pho PA 09/26/2019 for hospital f/u visit after presentation to [facility] ED after fall, nausea, AMS, worsening dyspnea, weakness. Weights at home were stable around 134-136 lbs. BNP 1555. CXR R pleural effusion with subsequent thoracentesis with 1.2 L fluid removed. Atrial fib w/ RVR on admission. She was started on Amiodarone while inpatient and dc'd on 200 mg po bid x 1 month then 200 mg daily thereafter.   Recent phone call from Kristy Mckinney to [place] office on 12/01/2019, stating patient had gained  26 lbs.  She is back today for follow-up after her torsemide was increased to 20 mg p.o. twice daily due to increasing weight gain and lower extremity edema.  She has lost approximately 5 pounds since last visit.  She states according to her scales at home she weighed 138 pounds this morning.  She still has some lower extremity edema.  She denies any dyspnea.  She is resting comfortably in a wheelchair.  Blood pressure is on the soft side today 102/68.  Past Medical History:  Diagnosis Date  . Abdominal distension (gaseous)   . Acute on chronic diastolic heart failure ([place])   . Anxiety   . Atrial fibrillation (Kristy Mckinney)   . Chronic back pain   . Dementia ([place])   . Disorder of left temporomandibular joint   . Dysphagia 05/03/2016  . Essential hypertension   . GERD (gastroesophageal reflux disease)   . History of bronchitis   . Iron deficiency anemia   . Irritable bowel syndrome with diarrhea   . Osteoporosis   . Pneumonia     Past Surgical  History:  Procedure Laterality Date  . ABDOMINAL HYSTERECTOMY    . BACK SURGERY    . CHOLECYSTECTOMY N/A 01/29/2014   Procedure: LAPAROSCOPIC CHOLECYSTECTOMY;  Surgeon: Scherry Ran, MD;  Location: [facility];  Service: General;  Laterality: N/A;  . COLONOSCOPY N/A 10/27/2015   Procedure: COLONOSCOPY;  Surgeon: Rogene Houston, MD;  [facility];  Service: Endoscopy;  Laterality: N/A;  215  . INTRAMEDULLARY (IM) NAIL INTERTROCHANTERIC Right 11/29/2018   Procedure: INTRAMEDULLARY (IM) NAIL INTERTROCHANTRIC FRACTURE;  Surgeon: Renette Butters, MD;  Location: [facility];  Service: Orthopedics;  Laterality: Right;  . TONSILLECTOMY    . YAG LASER APPLICATION Left [id number]   Procedure: YAG LASER APPLICATION;  Surgeon: Elta Guadeloupe T. Gershon Crane, MD;  Location: [facility];  Service: Ophthalmology;  Laterality: Left;  left    Current Outpatient Medications  Medication Sig Dispense Refill  . acetaminophen (TYLENOL) 325 MG tablet Take 650 mg by mouth every 6 (six) hours as needed.    Marland Kitchen alendronate (FOSAMAX) 70 MG tablet Take 1 tablet (70 mg total) by mouth once a week. Take with a full glass of water on an empty stomach. 4 tablet 0  . amiodarone (PACERONE) 200 MG tablet Take 200 mg by mouth daily.    Marland Kitchen apixaban (ELIQUIS) 5 MG TABS tablet Take 1 tablet (5 mg total) by mouth 2 (two) times daily. 180 tablet 3  . calcium-vitamin D (OS-CAL 500 + D)  500-200 MG-UNIT tablet Take 1 tablet by mouth 2 (two) times daily.    Marland Kitchen diltiazem (CARDIZEM CD) 180 MG 24 hr capsule Take 1 capsule (180 mg total) by mouth daily. 90 capsule 0  . ferrous sulfate 325 (65 FE) MG EC tablet Take 1 tablet (325 mg total) by mouth daily with breakfast. 30 tablet 3  . magnesium oxide (MAG-OX) 400 MG tablet Take 1 tablet (400 mg total) by mouth 3 (three) times daily. 90 tablet 0  . memantine (NAMENDA) 5 MG tablet Take 1 tablet (5 mg total) by mouth 2 (two) times daily. 180 tablet 0  . metoprolol tartrate (LOPRESSOR) 25 MG tablet Take 0.5  tablets (12.5 mg total) by mouth 2 (two) times daily.    . pantoprazole (PROTONIX) 40 MG tablet TAKE (1) TABLET BY MOUTH DAILY. 90 tablet 0  . Polyethyl Glycol-Propyl Glycol (SYSTANE OP) Place 1 drop into both eyes daily as needed. Dry Eyes     . potassium chloride (KLOR-CON) 10 MEQ tablet Take 1 tablet (10 mEq total) by mouth daily. For 7 days-further instructions given at follow up visit 7 tablet 0  . QUEtiapine (SEROQUEL) 25 MG tablet Take 1 tablet (25 mg total) by mouth at bedtime. 30 tablet 0  . sertraline (ZOLOFT) 50 MG tablet Take 1 tablet (50 mg total) by mouth every morning. 30 tablet 0  . torsemide (DEMADEX) 20 MG tablet Take 1 tablet (20 mg total) by mouth 2 (two) times daily. For 7 days-further instructions to be given at follow up appointment next week 14 tablet 0   No current facility-administered medications for this visit.   Allergies:  Penicillins, Biaxin [clarithromycin], Ciprofloxacin, Emetrol, Feldene [piroxicam], and Sulfa antibiotics   Social History: The patient  reports that she has never smoked. She has never used smokeless tobacco. She reports that she does not drink alcohol or use drugs.   Family History: The patient's family history includes Hypertension in her child.   ROS:  Please see the history of present illness. Otherwise, complete review of systems is positive for none.  All other systems are reviewed and negative.   Physical Exam: VS:  BP (!) 102/58   Pulse 80   Ht 5' 4.5" (1.638 m)   Wt 142 lb (64.4 kg)   SpO2 93%   BMI 24.00 kg/m , BMI Body mass index is 24 kg/m.  Wt Readings from Last 3 Encounters:  12/12/19 142 lb (64.4 kg)  12/04/19 147 lb 9.6 oz (67 kg)  12/03/19 148 lb 4.8 oz (67.3 kg)    General: Patient appears comfortable at rest. Neck: Supple,  elevated JVP when sitting erect, no carotid bruits, no thyromegaly. Lungs: Clear to auscultation throughout, nonlabored breathing at rest. Cardiac: Irregularly irregular rate and rhythm, no S3  or significant systolic murmur, no pericardial rub. Extremities: Mild bilateral pitting edema, distal pulses 2+. Skin: Warm and dry. Musculoskeletal: No kyphosis. Neuropsychiatric: Alert and oriented x3, affect grossly appropriate.  ECG:  An ECG dated 12/04/2019 was personally reviewed today and demonstrated:  Atrial fibrillation with a rate of 70, left axis deviation, septal infarct age undetermined.  Recent Labwork: 09/10/2019: ALT 25; AST 26; TSH 1.877 10/12/2019: B Natriuretic Peptide 915.0 10/14/2019: Magnesium 1.9 10/28/2019: BUN 34; Creat 1.34; Hemoglobin 9.8; Platelets 271; Potassium 5.0; Sodium 141     Component Value Date/Time   CHOL 186 03/08/2018 0000   TRIG 83 03/08/2018 0000   HDL 63 03/08/2018 0000   LDLCALC 106 03/08/2018 0000    Other  Studies Reviewed Today:  Echocardiogram: 03/2019 IMPRESSIONS  1. Left ventricular ejection fraction, by visual estimation, is 70 to  75%. The left ventricle has hyperdynamic function. Decreased left  ventricular size. There is moderately increased left ventricular  hypertrophy.  2. Left ventricular diastolic Doppler parameters are indeterminate  pattern of LV diastolic filling.  3. Right ventricular volume and pressure overload.  4. Global right ventricle has normal systolic function.The right  ventricular size is moderately enlarged. Mildly increased right  ventricular wall thickness.  5. Left atrial size was severely dilated.  6. Right atrial size was normal.  7. Presence of pericardial fat pad.  8. Moderate aortic valve annular calcification.  9. Moderate to severe mitral annular calcification.  10. Severe calcification of the mitral valve leaflet(s). Valve appears  stenotic, not completely evaluated. Suggest follow-up images with VTI  measurements or planimetry if possible.  11. The mitral valve is degenerative. Mild mitral valve regurgitation.  12. The tricuspid valve is grossly normal. Tricuspid valve regurgitation    is mild.  13. Aortic valve area, by VTI measures 1.83 cm.  14. Aortic valve mean gradient measures 8.8 mmHg.  15. The aortic valve is tricuspid Aortic valve regurgitation was not  visualized by color flow Doppler. Mild aortic valve stenosis.  16. Moderately elevated pulmonary artery systolic pressure.  17. The tricuspid regurgitant velocity is 3.67 m/s, and with an assumed  right atrial pressure of 8 mmHg, the estimated right ventricular systolic  pressure is moderately elevated at 61.9 mmHg.  18. The inferior vena cava is normal in size with <50% respiratory  variability, suggesting right atrial pressure of 8 mmHg.  19. The pulmonic valve was grossly normal. Pulmonic valve regurgitation is  trivial by color flow Doppler.   Assessment and Plan:   1. Persistent atrial fibrillation ([place]) She continues in atrial fibrillation with rate control.  Currently taking amiodarone 200 mg daily.  Continue amiodarone 200 mg daily, continue Eliquis 5 mg p.o. twice daily.  Continue Cardizem 180 mg daily.  Decrease metoprolol to 12.5 mg twice daily due to low blood pressures today.    2. Chronic diastolic congestive heart failure ([place])  She has lost some weight since starting the increased dose of torsemide 20 mg p.o. twice daily.  Her blood pressure is in the low range of 102/58.  Her weight today is 142.  She states her home weight on her scales this morning was 138.  Her previous weight at last visit on our scales was 147.  Her lungs are clear to auscultation.  She continues with mild lower extremity pitting edema.  She may have some element of venous insufficiency contributing to edema.  Please get basic metabolic panel and magnesium.  We will adjust torsemide based on results  3. Valvular heart disease  Evidence on most recent echo of mild mitral regurgitation, "valve appears stenotic"(Suggest follow-up images with VTI measurements or planimetry if possible), mild tricuspid regurgitation, mild aortic  valve stenosis, right ventricular systolic pressure moderately elevated at 61.9.  Trivial pulmonic valve regurgitation. RV Volume and pressure overload.    Medication Adjustments/Labs and Tests Ordered: Current medicines are reviewed at length with the patient today.  Concerns regarding medicines are outlined above.   Disposition: Follow-up with Dr. Domenic Polite or APP 1 month   Signed, Levell July, NP 12/12/2019 1:29 PM    [facility] [place], [place], [place] [address] Phone: [phone]; Fax: [phone]

## 2019-12-12 ENCOUNTER — Other Ambulatory Visit: Payer: Self-pay

## 2019-12-12 ENCOUNTER — Ambulatory Visit (INDEPENDENT_AMBULATORY_CARE_PROVIDER_SITE_OTHER): Payer: PPO | Admitting: Family Medicine

## 2019-12-12 ENCOUNTER — Encounter: Payer: Self-pay | Admitting: Family Medicine

## 2019-12-12 VITALS — BP 102/58 | HR 80 | Ht 64.5 in | Wt 142.0 lb

## 2019-12-12 DIAGNOSIS — I10 Essential (primary) hypertension: Secondary | ICD-10-CM | POA: Diagnosis not present

## 2019-12-12 DIAGNOSIS — I4891 Unspecified atrial fibrillation: Secondary | ICD-10-CM

## 2019-12-12 DIAGNOSIS — I5031 Acute diastolic (congestive) heart failure: Secondary | ICD-10-CM

## 2019-12-12 MED ORDER — METOPROLOL TARTRATE 25 MG PO TABS: 12.5000 mg | ORAL_TABLET | Freq: Two times a day (BID) | ORAL | Status: DC

## 2019-12-12 MED ORDER — APIXABAN 5 MG PO TABS: 5.0000 mg | ORAL_TABLET | Freq: Two times a day (BID) | ORAL | 3 refills | Status: DC

## 2019-12-12 NOTE — Patient Instructions (Signed)
Medication Instructions:   Decrease Lopressor to 12.5mg  twice a day.  Continue all other medications.    Labwork:  BMET, Magnesium - orders given today.   Office will contact with results via phone or letter.    Testing/Procedures: none  Follow-Up: 1 month   Any Other Special Instructions Will Be Listed Below (If Applicable).  If you need a refill on your cardiac medications before your next appointment, please call your pharmacy.

## 2019-12-13 ENCOUNTER — Emergency Department (HOSPITAL_COMMUNITY): Payer: PPO

## 2019-12-13 ENCOUNTER — Encounter (HOSPITAL_COMMUNITY): Payer: Self-pay | Admitting: Emergency Medicine

## 2019-12-13 ENCOUNTER — Inpatient Hospital Stay (HOSPITAL_COMMUNITY)
Admission: EM | Admit: 2019-12-13 | Discharge: 2019-12-23 | DRG: 291 | Disposition: A | Discharge status: Hospice | Payer: PPO | Attending: Family Medicine | Admitting: Family Medicine

## 2019-12-13 ENCOUNTER — Other Ambulatory Visit: Payer: Self-pay

## 2019-12-13 DIAGNOSIS — S3993XA Unspecified injury of pelvis, initial encounter: Secondary | ICD-10-CM | POA: Diagnosis not present

## 2019-12-13 DIAGNOSIS — I472 Ventricular tachycardia: Secondary | ICD-10-CM | POA: Diagnosis not present

## 2019-12-13 DIAGNOSIS — I130 Hypertensive heart and chronic kidney disease with heart failure and stage 1 through stage 4 chronic kidney disease, or unspecified chronic kidney disease: Secondary | ICD-10-CM | POA: Diagnosis present

## 2019-12-13 DIAGNOSIS — Z881 Allergy status to other antibiotic agents status: Secondary | ICD-10-CM

## 2019-12-13 DIAGNOSIS — Z880 Allergy status to penicillin: Secondary | ICD-10-CM

## 2019-12-13 DIAGNOSIS — I110 Hypertensive heart disease with heart failure: Secondary | ICD-10-CM | POA: Diagnosis not present

## 2019-12-13 DIAGNOSIS — J918 Pleural effusion in other conditions classified elsewhere: Secondary | ICD-10-CM | POA: Diagnosis present

## 2019-12-13 DIAGNOSIS — R079 Chest pain, unspecified: Secondary | ICD-10-CM | POA: Diagnosis not present

## 2019-12-13 DIAGNOSIS — I5043 Acute on chronic combined systolic (congestive) and diastolic (congestive) heart failure: Secondary | ICD-10-CM | POA: Diagnosis present

## 2019-12-13 DIAGNOSIS — F0390 Unspecified dementia without behavioral disturbance: Secondary | ICD-10-CM | POA: Diagnosis present

## 2019-12-13 DIAGNOSIS — I4821 Permanent atrial fibrillation: Secondary | ICD-10-CM | POA: Diagnosis present

## 2019-12-13 DIAGNOSIS — R4189 Other symptoms and signs involving cognitive functions and awareness: Secondary | ICD-10-CM | POA: Diagnosis present

## 2019-12-13 DIAGNOSIS — M549 Dorsalgia, unspecified: Secondary | ICD-10-CM | POA: Diagnosis present

## 2019-12-13 DIAGNOSIS — Z8249 Family history of ischemic heart disease and other diseases of the circulatory system: Secondary | ICD-10-CM

## 2019-12-13 DIAGNOSIS — Z7983 Long term (current) use of bisphosphonates: Secondary | ICD-10-CM

## 2019-12-13 DIAGNOSIS — F419 Anxiety disorder, unspecified: Secondary | ICD-10-CM | POA: Diagnosis present

## 2019-12-13 DIAGNOSIS — I5020 Unspecified systolic (congestive) heart failure: Secondary | ICD-10-CM

## 2019-12-13 DIAGNOSIS — I444 Left anterior fascicular block: Secondary | ICD-10-CM | POA: Diagnosis present

## 2019-12-13 DIAGNOSIS — S299XXA Unspecified injury of thorax, initial encounter: Secondary | ICD-10-CM | POA: Diagnosis not present

## 2019-12-13 DIAGNOSIS — K219 Gastro-esophageal reflux disease without esophagitis: Secondary | ICD-10-CM | POA: Diagnosis present

## 2019-12-13 DIAGNOSIS — I5082 Biventricular heart failure: Secondary | ICD-10-CM | POA: Diagnosis present

## 2019-12-13 DIAGNOSIS — Z7189 Other specified counseling: Secondary | ICD-10-CM | POA: Diagnosis not present

## 2019-12-13 DIAGNOSIS — R55 Syncope and collapse: Secondary | ICD-10-CM | POA: Diagnosis present

## 2019-12-13 DIAGNOSIS — R1310 Dysphagia, unspecified: Secondary | ICD-10-CM | POA: Diagnosis present

## 2019-12-13 DIAGNOSIS — M810 Age-related osteoporosis without current pathological fracture: Secondary | ICD-10-CM | POA: Diagnosis present

## 2019-12-13 DIAGNOSIS — Z7901 Long term (current) use of anticoagulants: Secondary | ICD-10-CM

## 2019-12-13 DIAGNOSIS — Z888 Allergy status to other drugs, medicaments and biological substances status: Secondary | ICD-10-CM

## 2019-12-13 DIAGNOSIS — E785 Hyperlipidemia, unspecified: Secondary | ICD-10-CM | POA: Diagnosis present

## 2019-12-13 DIAGNOSIS — J9811 Atelectasis: Secondary | ICD-10-CM | POA: Diagnosis not present

## 2019-12-13 DIAGNOSIS — I5031 Acute diastolic (congestive) heart failure: Secondary | ICD-10-CM | POA: Diagnosis not present

## 2019-12-13 DIAGNOSIS — R52 Pain, unspecified: Secondary | ICD-10-CM | POA: Diagnosis not present

## 2019-12-13 DIAGNOSIS — F329 Major depressive disorder, single episode, unspecified: Secondary | ICD-10-CM | POA: Diagnosis present

## 2019-12-13 DIAGNOSIS — I482 Chronic atrial fibrillation, unspecified: Secondary | ICD-10-CM | POA: Diagnosis not present

## 2019-12-13 DIAGNOSIS — G8929 Other chronic pain: Secondary | ICD-10-CM | POA: Diagnosis present

## 2019-12-13 DIAGNOSIS — I509 Heart failure, unspecified: Secondary | ICD-10-CM | POA: Diagnosis not present

## 2019-12-13 DIAGNOSIS — E876 Hypokalemia: Secondary | ICD-10-CM | POA: Diagnosis present

## 2019-12-13 DIAGNOSIS — I5033 Acute on chronic diastolic (congestive) heart failure: Secondary | ICD-10-CM | POA: Diagnosis present

## 2019-12-13 DIAGNOSIS — Z66 Do not resuscitate: Secondary | ICD-10-CM

## 2019-12-13 DIAGNOSIS — J90 Pleural effusion, not elsewhere classified: Secondary | ICD-10-CM | POA: Diagnosis not present

## 2019-12-13 DIAGNOSIS — J948 Other specified pleural conditions: Secondary | ICD-10-CM | POA: Diagnosis not present

## 2019-12-13 DIAGNOSIS — Z20822 Contact with and (suspected) exposure to covid-19: Secondary | ICD-10-CM | POA: Diagnosis present

## 2019-12-13 DIAGNOSIS — J811 Chronic pulmonary edema: Secondary | ICD-10-CM | POA: Diagnosis not present

## 2019-12-13 DIAGNOSIS — Z9889 Other specified postprocedural states: Secondary | ICD-10-CM

## 2019-12-13 DIAGNOSIS — M5489 Other dorsalgia: Secondary | ICD-10-CM | POA: Diagnosis not present

## 2019-12-13 DIAGNOSIS — R0902 Hypoxemia: Secondary | ICD-10-CM | POA: Diagnosis not present

## 2019-12-13 DIAGNOSIS — Z882 Allergy status to sulfonamides status: Secondary | ICD-10-CM

## 2019-12-13 DIAGNOSIS — I4891 Unspecified atrial fibrillation: Secondary | ICD-10-CM | POA: Diagnosis present

## 2019-12-13 DIAGNOSIS — N1831 Chronic kidney disease, stage 3a: Secondary | ICD-10-CM | POA: Diagnosis present

## 2019-12-13 DIAGNOSIS — G9340 Encephalopathy, unspecified: Secondary | ICD-10-CM | POA: Diagnosis present

## 2019-12-13 DIAGNOSIS — K580 Irritable bowel syndrome with diarrhea: Secondary | ICD-10-CM | POA: Diagnosis present

## 2019-12-13 DIAGNOSIS — N179 Acute kidney failure, unspecified: Secondary | ICD-10-CM | POA: Diagnosis not present

## 2019-12-13 DIAGNOSIS — S0990XA Unspecified injury of head, initial encounter: Secondary | ICD-10-CM | POA: Diagnosis not present

## 2019-12-13 DIAGNOSIS — J9601 Acute respiratory failure with hypoxia: Secondary | ICD-10-CM | POA: Diagnosis present

## 2019-12-13 DIAGNOSIS — I083 Combined rheumatic disorders of mitral, aortic and tricuspid valves: Secondary | ICD-10-CM | POA: Diagnosis present

## 2019-12-13 DIAGNOSIS — I2729 Other secondary pulmonary hypertension: Secondary | ICD-10-CM | POA: Diagnosis present

## 2019-12-13 DIAGNOSIS — M542 Cervicalgia: Secondary | ICD-10-CM | POA: Diagnosis not present

## 2019-12-13 DIAGNOSIS — Z79899 Other long term (current) drug therapy: Secondary | ICD-10-CM

## 2019-12-13 DIAGNOSIS — S199XXA Unspecified injury of neck, initial encounter: Secondary | ICD-10-CM | POA: Diagnosis not present

## 2019-12-13 DIAGNOSIS — Z515 Encounter for palliative care: Secondary | ICD-10-CM | POA: Diagnosis not present

## 2019-12-13 HISTORY — DX: Rheumatic mitral stenosis: I05.0

## 2019-12-13 HISTORY — DX: Pulmonary hypertension, unspecified: I27.20

## 2019-12-13 HISTORY — DX: Chronic diastolic (congestive) heart failure: I50.32

## 2019-12-13 LAB — DIFFERENTIAL
Abs Immature Granulocytes: 0.08 10*3/uL — ABNORMAL HIGH (ref 0.00–0.07)
Basophils Absolute: 0 10*3/uL (ref 0.0–0.1)
Basophils Relative: 1 %
Eosinophils Absolute: 0.1 10*3/uL (ref 0.0–0.5)
Eosinophils Relative: 1 %
Immature Granulocytes: 1 %
Lymphocytes Relative: 13 %
Lymphs Abs: 1.1 10*3/uL (ref 0.7–4.0)
Monocytes Absolute: 0.8 10*3/uL (ref 0.1–1.0)
Monocytes Relative: 9 %
Neutro Abs: 6.3 10*3/uL (ref 1.7–7.7)
Neutrophils Relative %: 75 %

## 2019-12-13 LAB — CBC
HCT: 32.5 % — ABNORMAL LOW (ref 36.0–46.0)
Hemoglobin: 9.4 g/dL — ABNORMAL LOW (ref 12.0–15.0)
MCH: 22.8 pg — ABNORMAL LOW (ref 26.0–34.0)
MCHC: 28.9 g/dL — ABNORMAL LOW (ref 30.0–36.0)
MCV: 78.9 fL — ABNORMAL LOW (ref 80.0–100.0)
Platelets: 304 10*3/uL (ref 150–400)
RBC: 4.12 MIL/uL (ref 3.87–5.11)
RDW: 19.6 % — ABNORMAL HIGH (ref 11.5–15.5)
WBC: 8.4 10*3/uL (ref 4.0–10.5)
nRBC: 0 % (ref 0.0–0.2)

## 2019-12-13 LAB — COMPREHENSIVE METABOLIC PANEL
ALT: 32 U/L (ref 0–44)
AST: 37 U/L (ref 15–41)
Albumin: 3.9 g/dL (ref 3.5–5.0)
Alkaline Phosphatase: 127 U/L — ABNORMAL HIGH (ref 38–126)
Anion gap: 14 (ref 5–15)
BUN: 47 mg/dL — ABNORMAL HIGH (ref 8–23)
CO2: 34 mmol/L — ABNORMAL HIGH (ref 22–32)
Calcium: 9.4 mg/dL (ref 8.9–10.3)
Chloride: 93 mmol/L — ABNORMAL LOW (ref 98–111)
Creatinine, Ser: 2.22 mg/dL — ABNORMAL HIGH (ref 0.44–1.00)
GFR calc Af Amer: 23 mL/min — ABNORMAL LOW (ref >60)
GFR calc non Af Amer: 20 mL/min — ABNORMAL LOW (ref >60)
Glucose, Bld: 129 mg/dL — ABNORMAL HIGH (ref 70–99)
Potassium: 3.4 mmol/L — ABNORMAL LOW (ref 3.5–5.1)
Sodium: 141 mmol/L (ref 135–145)
Total Bilirubin: 1.1 mg/dL (ref 0.3–1.2)
Total Protein: 7.1 g/dL (ref 6.5–8.1)

## 2019-12-13 LAB — SARS CORONAVIRUS 2 BY RT PCR (HOSPITAL ORDER, PERFORMED IN ~~LOC~~ HOSPITAL LAB): SARS Coronavirus 2: NEGATIVE

## 2019-12-13 LAB — LACTIC ACID, PLASMA: Lactic Acid, Venous: 1.2 mmol/L (ref 0.5–1.9)

## 2019-12-13 LAB — BRAIN NATRIURETIC PEPTIDE: B Natriuretic Peptide: 902 pg/mL — ABNORMAL HIGH (ref 0.0–100.0)

## 2019-12-13 LAB — TROPONIN I (HIGH SENSITIVITY): Troponin I (High Sensitivity): 15 ng/L (ref <18)

## 2019-12-13 MED ORDER — SODIUM CHLORIDE 0.9 % IV BOLUS: 1000.0000 mL | Freq: Once | INTRAVENOUS | Status: DC

## 2019-12-13 MED ORDER — FUROSEMIDE 10 MG/ML IJ SOLN
40.0000 mg | Freq: Once | INTRAMUSCULAR | Status: AC
  Administered 2019-12-13: 40 mg via INTRAVENOUS
  Filled 2019-12-13: qty 4

## 2019-12-13 MED ORDER — SODIUM CHLORIDE 0.9 % IV SOLN: Freq: Once | INTRAVENOUS | Status: AC

## 2019-12-13 MED ORDER — MORPHINE SULFATE (PF) 2 MG/ML IV SOLN
2.0000 mg | Freq: Once | INTRAVENOUS | Status: AC
  Administered 2019-12-13: 2 mg via INTRAVENOUS
  Filled 2019-12-13: qty 1

## 2019-12-13 MED ORDER — SODIUM CHLORIDE 0.9 % IV BOLUS
500.0000 mL | Freq: Once | INTRAVENOUS | Status: AC
  Administered 2019-12-13: 500 mL via INTRAVENOUS

## 2019-12-13 NOTE — ED Triage Notes (Signed)
Pt reports she woke up in the kitchen floor this afternoon. Pt reports the last thing she remembers was putting a baked potatoe in the microwave this afternoon. Pt C/O back pain and pain all over. Pt O2 78% on RA in triage. Pt does not wear oxygen at home.

## 2019-12-13 NOTE — ED Provider Notes (Addendum)
[facility] EMERGENCY DEPARTMENT Provider Note   CSN: [id number] Arrival date & time: 12/13/19  2144     History Chief Complaint  Patient presents with  . Hosford is a 86 y.o. female.  Patient with a fall.  Patient has a history of heart failure.  She was unable to get up.  Her daughter states that she has been weak recently.  When she arrived her O2 sats were in the 22s  The history is provided by the patient and a relative. No language interpreter was used.  Fall This is a new problem. The current episode started 12 to 24 hours ago. The problem occurs rarely. The problem has been resolved. Pertinent negatives include no chest pain, no abdominal pain and no headaches. Nothing aggravates the symptoms. Nothing relieves the symptoms. She has tried nothing for the symptoms. The treatment provided no relief.       Past Medical History:  Diagnosis Date  . Abdominal distension (gaseous)   . Acute on chronic diastolic heart failure ([place])   . Anxiety   . Atrial fibrillation ([place])   . Chronic back pain   . Dementia ([place])   . Disorder of left temporomandibular joint   . Dysphagia 05/03/2016  . Essential hypertension   . GERD (gastroesophageal reflux disease)   . History of bronchitis   . Iron deficiency anemia   . Irritable bowel syndrome with diarrhea   . Osteoporosis   . Pneumonia     Patient Active Problem List   Diagnosis Date Noted  . Acute respiratory failure ([place]) 10/12/2019  . Elevated glucose 08/20/2019  . Degenerative lumbar disc 08/20/2019  . Left hip pain 08/20/2019  . Osteoporosis   . Anemia, unspecified   . Anxiety disorder, unspecified   . Vitamin D deficiency, unspecified   . Essential (primary) hypertension   . Abdominal distension (gaseous)   . Disorder of the skin and subcutaneous tissue, unspecified   . Gastro-esophageal reflux disease with esophagitis   . Iron deficiency anemia, unspecified   . Irritable bowel syndrome with  diarrhea   . Left temporomandibular joint disorder, unspecified   . Unspecified dementia without behavioral disturbance ([place])   . Unspecified fracture of unspecified wrist and hand, initial encounter for closed fracture   . Advanced care planning/counseling discussion   . Acute on chronic diastolic congestive heart failure ([place]) 05/02/2019  . Acute on chronic diastolic CHF (congestive heart failure) ([place]) 05/02/2019  . Palliative care by specialist   . Goals of care, counseling/discussion   . Generalized weakness   . Pleural effusion 04/13/2019  . Pancreatitis, acute 04/13/2019  . Acute respiratory failure with hypoxia ([place]) 04/13/2019  . Bradycardia 04/10/2019  . Hypotension 04/10/2019  . Hyperkalemia 04/10/2019  . ARF (acute renal failure) ([place]) 04/10/2019  . Atrial fibrillation ([place]) 04/06/2019  . A-fib ([place]) 04/04/2019  . Dementia ([place]) 12/12/2018  . GERD without esophagitis 12/06/2018  . Post-menopausal osteoporosis 12/06/2018  . Acute delirium 12/06/2018  . Hypomagnesemia 12/06/2018  . Acute blood loss anemia 12/06/2018  . Closed fracture of right femur, unspecified fracture morphology, initial encounter ([place]) 11/28/2018  . Acute diastolic CHF (congestive heart failure) ([place]) 11/28/2018  . Fracture, intertrochanteric, right femur ([place]) 11/28/2018  . Atrial fibrillation with rapid ventricular response ([place]) 11/25/2018  . Atrial fibrillation with RVR ([place]) 11/24/2018  . Chronic back pain 11/24/2018  . Anxiety and depression 11/24/2018  . Chronic constipation 05/03/2016  . Dysphagia 05/03/2016  . Cholecystitis with  cholelithiasis 01/29/2014    Past Surgical History:  Procedure Laterality Date  . ABDOMINAL HYSTERECTOMY    . BACK SURGERY    . CHOLECYSTECTOMY N/A 01/29/2014   Procedure: LAPAROSCOPIC CHOLECYSTECTOMY;  Surgeon: Scherry Ran, MD;  Location: [facility];  Service: General;  Laterality: N/A;  . COLONOSCOPY N/A 10/27/2015   Procedure: COLONOSCOPY;  Surgeon:  Rogene Houston, MD;  Location: [facility];  Service: Endoscopy;  Laterality: N/A;  215  . INTRAMEDULLARY (IM) NAIL INTERTROCHANTERIC Right 11/29/2018   Procedure: INTRAMEDULLARY (IM) NAIL INTERTROCHANTRIC FRACTURE;  Surgeon: Renette Butters, MD;  Location: [place];  Service: Orthopedics;  Laterality: Right;  . TONSILLECTOMY    . YAG LASER APPLICATION Left 95/18/8416   Procedure: YAG LASER APPLICATION;  Surgeon: Elta Guadeloupe T. Gershon Crane, MD;  Location: [facility];  Service: Ophthalmology;  Laterality: Left;  left     OB History   No obstetric history on file.     Family History  Problem Relation Age of Onset  . Hypertension Child     Social History   Tobacco Use  . Smoking status: Never Smoker  . Smokeless tobacco: Never Used  Substance Use Topics  . Alcohol use: No  . Drug use: No    Home Medications Prior to Admission medications   Medication Sig Start Date End Date Taking? Authorizing Provider  acetaminophen (TYLENOL) 325 MG tablet Take 650 mg by mouth every 6 (six) hours as needed. 12/05/18   [provider]  alendronate (FOSAMAX) 70 MG tablet Take 1 tablet (70 mg total) by mouth once a week. Take with a full glass of water on an empty stomach. 11/26/19   Corum, Rex Kras, MD  amiodarone (PACERONE) 200 MG tablet Take 200 mg by mouth daily.    [provider]  apixaban (ELIQUIS) 5 MG TABS tablet Take 1 tablet (5 mg total) by mouth 2 (two) times daily. 12/12/19   Verta Ellen., NP  calcium-vitamin D (OS-CAL 500 + D) 500-200 MG-UNIT tablet Take 1 tablet by mouth 2 (two) times daily. 12/05/18   Desiree Hane, MD  diltiazem (CARDIZEM CD) 180 MG 24 hr capsule Take 1 capsule (180 mg total) by mouth daily. 11/26/19 11/25/20  [name], MD  ferrous sulfate 325 (65 FE) MG EC tablet Take 1 tablet (325 mg total) by mouth daily with breakfast. 12/03/19 12/02/20  Laurine Blazer A, PA-C  magnesium oxide (MAG-OX) 400 MG tablet Take 1 tablet (400 mg total) by mouth 3 (three) times  daily. 12/23/18   Gerlene Fee, NP  memantine (NAMENDA) 5 MG tablet Take 1 tablet (5 mg total) by mouth 2 (two) times daily. 11/26/19   Corum, Rex Kras, MD  metoprolol tartrate (LOPRESSOR) 25 MG tablet Take 0.5 tablets (12.5 mg total) by mouth 2 (two) times daily. 12/12/19   Verta Ellen., NP  pantoprazole (PROTONIX) 40 MG tablet TAKE (1) TABLET BY MOUTH DAILY. 11/26/19   Corum, Rex Kras, MD  Polyethyl Glycol-Propyl Glycol (SYSTANE OP) Place 1 drop into both eyes daily as needed. Dry Eyes     [provider]  potassium chloride (KLOR-CON) 10 MEQ tablet Take 1 tablet (10 mEq total) by mouth daily. For 7 days-further instructions given at follow up visit 12/05/19 03/04/20  Verta Ellen., NP  QUEtiapine (SEROQUEL) 25 MG tablet Take 1 tablet (25 mg total) by mouth at bedtime. 12/23/18   Gerlene Fee, NP  sertraline (ZOLOFT) 50 MG tablet Take 1 tablet (  50 mg total) by mouth every morning. 12/23/18   Gerlene Fee, NP  torsemide (DEMADEX) 20 MG tablet Take 1 tablet (20 mg total) by mouth 2 (two) times daily. For 7 days-further instructions to be given at follow up appointment next week 12/04/19   Verta Ellen., NP    Allergies    Penicillins, Biaxin [clarithromycin], Ciprofloxacin, Emetrol, Feldene [piroxicam], and Sulfa antibiotics  Review of Systems   Review of Systems  Constitutional: Negative for appetite change and fatigue.  HENT: Negative for congestion, ear discharge and sinus pressure.   Eyes: Negative for discharge.  Respiratory: Negative for cough.   Cardiovascular: Negative for chest pain.  Gastrointestinal: Negative for abdominal pain and diarrhea.  Genitourinary: Negative for frequency and hematuria.  Musculoskeletal: Negative for back pain.  Skin: Negative for rash.  Neurological: Negative for seizures and headaches.  Psychiatric/Behavioral: Negative for hallucinations.    Physical Exam Updated Vital Signs BP 117/63   Pulse 77   Temp 97.7 F (36.5 C)  (Oral)   Resp (!) 24   SpO2 94%   Physical Exam Vitals and nursing note reviewed.  Constitutional:      Appearance: She is well-developed.  HENT:     Head: Normocephalic.     Mouth/Throat:     Mouth: Mucous membranes are moist.  Eyes:     General: No scleral icterus.    Conjunctiva/sclera: Conjunctivae normal.  Neck:     Thyroid: No thyromegaly.  Cardiovascular:     Rate and Rhythm: Normal rate and regular rhythm.     Heart sounds: No murmur. No friction rub. No gallop.   Pulmonary:     Effort: Respiratory distress present.     Breath sounds: No stridor. No wheezing or rales.  Chest:     Chest wall: No tenderness.  Abdominal:     General: There is no distension.     Tenderness: There is no abdominal tenderness. There is no rebound.  Musculoskeletal:        General: Normal range of motion.     Cervical back: Neck supple.  Lymphadenopathy:     Cervical: No cervical adenopathy.  Skin:    Findings: No erythema or rash.  Neurological:     Mental Status: She is alert and oriented to person, place, and time.     Motor: No abnormal muscle tone.     Coordination: Coordination normal.  Psychiatric:        Behavior: Behavior normal.     ED Results / Procedures / Treatments   Labs (all labs ordered are listed, but only abnormal results are displayed) Labs Reviewed  CBC - Abnormal; Notable for the following components:      Result Value   Hemoglobin 9.4 (*)    HCT 32.5 (*)    MCV 78.9 (*)    MCH 22.8 (*)    MCHC 28.9 (*)    RDW 19.6 (*)    All other components within normal limits  COMPREHENSIVE METABOLIC PANEL - Abnormal; Notable for the following components:   Potassium 3.4 (*)    Chloride 93 (*)    CO2 34 (*)    Glucose, Bld 129 (*)    BUN 47 (*)    Creatinine, Ser 2.22 (*)    Alkaline Phosphatase 127 (*)    GFR calc non Af Amer 20 (*)    GFR calc Af Amer 23 (*)    All other components within normal limits  BRAIN NATRIURETIC PEPTIDE - Abnormal;  Notable for  the following components:   B Natriuretic Peptide 902.0 (*)    All other components within normal limits  DIFFERENTIAL - Abnormal; Notable for the following components:   Abs Immature Granulocytes 0.08 (*)    All other components within normal limits  SARS CORONAVIRUS 2 BY RT PCR (HOSPITAL ORDER, [facility])  LACTIC ACID, PLASMA  TROPONIN I (HIGH SENSITIVITY)  TROPONIN I (HIGH SENSITIVITY)    EKG None  Radiology CT Head Wo Contrast  Result Date: 12/13/2019 CLINICAL DATA:  [place], back pain EXAM: CT HEAD WITHOUT CONTRAST CT CERVICAL SPINE WITHOUT CONTRAST TECHNIQUE: Multidetector CT imaging of the head and cervical spine was performed following the standard protocol without intravenous contrast. Multiplanar CT image reconstructions of the cervical spine were also generated. COMPARISON:  11/28/2018 FINDINGS: CT HEAD FINDINGS Brain: Chronic small vessel ischemic changes are seen within the periventricular white matter. No acute infarct or hemorrhage. Lateral ventricles and midline structures are unremarkable. No acute extra-axial fluid collections. No mass effect. Vascular: No hyperdense vessel or unexpected calcification. Skull: There is a small ex of little scalp hematoma. No underlying fracture. Remainder of the calvarium is unremarkable. Sinuses/Orbits: No acute finding. Other: None. CT CERVICAL SPINE FINDINGS Alignment: Alignment is grossly anatomic. Skull base and vertebrae: No acute displaced fractures. Soft tissues and spinal canal: No prevertebral fluid or swelling. No visible canal hematoma. Disc levels: Disc space narrowing and osteophyte formation is seen from C3-4 through C6-7, most pronounced at the C5-6 level. At C3-4 there is minimal symmetrical neural foraminal encroachment. At C4-5 there is mild central stenosis with bilateral right greater than left neural foraminal encroachment. At C5-6 there is at least moderate to severe central canal stenosis with  significant right greater than left neural foraminal encroachment. At C6-7 there is mild central stenosis with severe right greater than left neural foraminal encroachment. Upper chest: Airway is patent. Large right pleural effusion partially visualized. Left apex is clear. Other: Reconstructed images demonstrate no additional findings. IMPRESSION: 1. No acute intracranial process. 2. No acute cervical spine fracture. 3. Significant multilevel cervical spondylosis, most pronounced at C5-6. Significant but less severe changes are seen at C4-5 and C6-7 as above. 4. Large right pleural effusion. Electronically Signed   By: Randa Ngo M.D.   On: 12/13/2019 23:22   CT Cervical Spine Wo Contrast  Result Date: 12/13/2019 CLINICAL DATA:  [place], back pain EXAM: CT HEAD WITHOUT CONTRAST CT CERVICAL SPINE WITHOUT CONTRAST TECHNIQUE: Multidetector CT imaging of the head and cervical spine was performed following the standard protocol without intravenous contrast. Multiplanar CT image reconstructions of the cervical spine were also generated. COMPARISON:  11/28/2018 FINDINGS: CT HEAD FINDINGS Brain: Chronic small vessel ischemic changes are seen within the periventricular white matter. No acute infarct or hemorrhage. Lateral ventricles and midline structures are unremarkable. No acute extra-axial fluid collections. No mass effect. Vascular: No hyperdense vessel or unexpected calcification. Skull: There is a small ex of little scalp hematoma. No underlying fracture. Remainder of the calvarium is unremarkable. Sinuses/Orbits: No acute finding. Other: None. CT CERVICAL SPINE FINDINGS Alignment: Alignment is grossly anatomic. Skull base and vertebrae: No acute displaced fractures. Soft tissues and spinal canal: No prevertebral fluid or swelling. No visible canal hematoma. Disc levels: Disc space narrowing and osteophyte formation is seen from C3-4 through C6-7, most pronounced at the C5-6 level. At C3-4 there is minimal  symmetrical neural foraminal encroachment. At C4-5 there is mild central stenosis with bilateral right greater than  left neural foraminal encroachment. At C5-6 there is at least moderate to severe central canal stenosis with significant right greater than left neural foraminal encroachment. At C6-7 there is mild central stenosis with severe right greater than left neural foraminal encroachment. Upper chest: Airway is patent. Large right pleural effusion partially visualized. Left apex is clear. Other: Reconstructed images demonstrate no additional findings. IMPRESSION: 1. No acute intracranial process. 2. No acute cervical spine fracture. 3. Significant multilevel cervical spondylosis, most pronounced at C5-6. Significant but less severe changes are seen at C4-5 and C6-7 as above. 4. Large right pleural effusion. Electronically Signed   By: Randa Ngo M.D.   On: 12/13/2019 23:22   DG Pelvis Portable  Result Date: 12/13/2019 CLINICAL DATA:  [place], back pain EXAM: PORTABLE PELVIS 1-2 VIEWS COMPARISON:  None. FINDINGS: Single frontal view of the pelvis demonstrates postsurgical changes right hip. No acute displaced fracture. Left convex scoliosis lumbar spine. Sacroiliac joints are normal. Soft tissues are unremarkable. IMPRESSION: 1. No acute displaced fracture. Electronically Signed   By: Randa Ngo M.D.   On: 12/13/2019 22:46   DG Chest Portable 1 View  Result Date: 12/13/2019 CLINICAL DATA:  [place], pain EXAM: PORTABLE CHEST 1 VIEW COMPARISON:  10/13/2019 FINDINGS: Single frontal view of the chest demonstrates an enlarged right pleural effusion with right basilar atelectasis. Left basilar consolidation unchanged. No pneumothorax. Cardiac silhouette is stable. Atherosclerosis aortic arch. No acute displaced fractures. IMPRESSION: 1. Enlarging right pleural effusion. 2. Persistent bibasilar areas of consolidation likely atelectasis, increased on the right since prior study. Electronically Signed   By:  Randa Ngo M.D.   On: 12/13/2019 22:43    Procedures Procedures (including critical care time)  Medications Ordered in ED Medications  furosemide (LASIX) injection 40 mg (has no administration in time range)  sodium chloride 0.9 % bolus 500 mL (0 mLs Intravenous Stopped 12/13/19 2246)  0.9 %  sodium chloride infusion ( Intravenous New Bag/Given 12/13/19 2248)    ED Course  I have reviewed the triage vital signs and the nursing notes.  Pertinent labs & imaging results that were available during my care of the patient were reviewed by me and considered in my medical decision making (see chart for details). CRITICAL CARE Performed by: Milton Ferguson Total critical care time: 45 minutes Critical care time was exclusive of separately billable procedures and treating other patients. Critical care was necessary to treat or prevent imminent or life-threatening deterioration. Critical care was time spent personally by me on the following activities: development of treatment plan with patient and/or surrogate as well as nursing, discussions with consultants, evaluation of patient's response to treatment, examination of patient, obtaining history from patient or surrogate, ordering and performing treatments and interventions, ordering and review of laboratory studies, ordering and review of radiographic studies, pulse oximetry and re-evaluation of patient's condition.    MDM Rules/Calculators/A&P                      Patient with fall and congestive heart failure she will be admitted to medicine      This patient presents to the ED for concern of fall injury this involves an extensive number of treatment options, and is a complaint that carries with it a high risk of complications and morbidity.  The differential diagnosis includes stroke   Lab Tests:   I Ordered, reviewed, and interpreted labs, which included CBC chemistries which showed kidney disease  Medicines ordered:   I  ordered medication  Lasix for chf  Imaging Studies ordered:   I ordered imaging studies which included chest x-ray and  I independently visualized and interpreted imaging which showed pleural effusion heart failure  Additional history obtained:   Additional history obtained from daughter  Previous records obtained and reviewed   Consultations Obtained:   I consulted hospitalist and discussed lab and imaging findings  Reevaluation:  After the interventions stated above, I reevaluated the patient and found mild improvement  Critical Interventions:  .   Final Clinical Impression(s) / ED Diagnoses Final diagnoses:  Systolic congestive heart failure, unspecified HF chronicity ([place])    Rx / DC Orders ED Discharge Orders    None       Milton Ferguson, MD 12/15/19 1200    Milton Ferguson, MD 12/24/19 1204    Milton Ferguson, MD 12/24/19 1204

## 2019-12-14 ENCOUNTER — Observation Stay (HOSPITAL_COMMUNITY): Payer: PPO

## 2019-12-14 DIAGNOSIS — Z9889 Other specified postprocedural states: Secondary | ICD-10-CM | POA: Diagnosis not present

## 2019-12-14 DIAGNOSIS — I509 Heart failure, unspecified: Secondary | ICD-10-CM | POA: Insufficient documentation

## 2019-12-14 DIAGNOSIS — M549 Dorsalgia, unspecified: Secondary | ICD-10-CM | POA: Diagnosis present

## 2019-12-14 DIAGNOSIS — I4821 Permanent atrial fibrillation: Secondary | ICD-10-CM

## 2019-12-14 DIAGNOSIS — Z515 Encounter for palliative care: Secondary | ICD-10-CM | POA: Diagnosis not present

## 2019-12-14 DIAGNOSIS — R55 Syncope and collapse: Secondary | ICD-10-CM | POA: Diagnosis present

## 2019-12-14 DIAGNOSIS — I482 Chronic atrial fibrillation, unspecified: Secondary | ICD-10-CM | POA: Diagnosis not present

## 2019-12-14 DIAGNOSIS — F419 Anxiety disorder, unspecified: Secondary | ICD-10-CM | POA: Diagnosis present

## 2019-12-14 DIAGNOSIS — I5033 Acute on chronic diastolic (congestive) heart failure: Secondary | ICD-10-CM | POA: Diagnosis not present

## 2019-12-14 DIAGNOSIS — F329 Major depressive disorder, single episode, unspecified: Secondary | ICD-10-CM | POA: Diagnosis present

## 2019-12-14 DIAGNOSIS — R1310 Dysphagia, unspecified: Secondary | ICD-10-CM | POA: Diagnosis present

## 2019-12-14 DIAGNOSIS — J9601 Acute respiratory failure with hypoxia: Secondary | ICD-10-CM

## 2019-12-14 DIAGNOSIS — J90 Pleural effusion, not elsewhere classified: Secondary | ICD-10-CM | POA: Diagnosis not present

## 2019-12-14 DIAGNOSIS — E785 Hyperlipidemia, unspecified: Secondary | ICD-10-CM | POA: Diagnosis present

## 2019-12-14 DIAGNOSIS — E876 Hypokalemia: Secondary | ICD-10-CM | POA: Diagnosis present

## 2019-12-14 DIAGNOSIS — F0390 Unspecified dementia without behavioral disturbance: Secondary | ICD-10-CM | POA: Diagnosis present

## 2019-12-14 DIAGNOSIS — G8929 Other chronic pain: Secondary | ICD-10-CM | POA: Diagnosis present

## 2019-12-14 DIAGNOSIS — I130 Hypertensive heart and chronic kidney disease with heart failure and stage 1 through stage 4 chronic kidney disease, or unspecified chronic kidney disease: Secondary | ICD-10-CM | POA: Diagnosis present

## 2019-12-14 DIAGNOSIS — K580 Irritable bowel syndrome with diarrhea: Secondary | ICD-10-CM | POA: Diagnosis present

## 2019-12-14 DIAGNOSIS — J918 Pleural effusion in other conditions classified elsewhere: Secondary | ICD-10-CM | POA: Diagnosis present

## 2019-12-14 DIAGNOSIS — N179 Acute kidney failure, unspecified: Secondary | ICD-10-CM | POA: Diagnosis not present

## 2019-12-14 DIAGNOSIS — I5082 Biventricular heart failure: Secondary | ICD-10-CM | POA: Diagnosis present

## 2019-12-14 DIAGNOSIS — Z20822 Contact with and (suspected) exposure to covid-19: Secondary | ICD-10-CM | POA: Diagnosis present

## 2019-12-14 DIAGNOSIS — I472 Ventricular tachycardia: Secondary | ICD-10-CM | POA: Diagnosis not present

## 2019-12-14 DIAGNOSIS — M810 Age-related osteoporosis without current pathological fracture: Secondary | ICD-10-CM | POA: Diagnosis present

## 2019-12-14 DIAGNOSIS — Z66 Do not resuscitate: Secondary | ICD-10-CM | POA: Diagnosis present

## 2019-12-14 DIAGNOSIS — K219 Gastro-esophageal reflux disease without esophagitis: Secondary | ICD-10-CM | POA: Diagnosis present

## 2019-12-14 DIAGNOSIS — I4891 Unspecified atrial fibrillation: Secondary | ICD-10-CM | POA: Diagnosis not present

## 2019-12-14 DIAGNOSIS — I5031 Acute diastolic (congestive) heart failure: Secondary | ICD-10-CM | POA: Diagnosis not present

## 2019-12-14 DIAGNOSIS — G9340 Encephalopathy, unspecified: Secondary | ICD-10-CM | POA: Diagnosis present

## 2019-12-14 DIAGNOSIS — Z7189 Other specified counseling: Secondary | ICD-10-CM | POA: Diagnosis not present

## 2019-12-14 DIAGNOSIS — I5043 Acute on chronic combined systolic (congestive) and diastolic (congestive) heart failure: Secondary | ICD-10-CM | POA: Diagnosis present

## 2019-12-14 DIAGNOSIS — R4189 Other symptoms and signs involving cognitive functions and awareness: Secondary | ICD-10-CM | POA: Diagnosis present

## 2019-12-14 LAB — BASIC METABOLIC PANEL
Anion gap: 13 (ref 5–15)
BUN: 43 mg/dL — ABNORMAL HIGH (ref 8–23)
CO2: 32 mmol/L (ref 22–32)
Calcium: 8.9 mg/dL (ref 8.9–10.3)
Chloride: 96 mmol/L — ABNORMAL LOW (ref 98–111)
Creatinine, Ser: 2.09 mg/dL — ABNORMAL HIGH (ref 0.44–1.00)
GFR calc Af Amer: 24 mL/min — ABNORMAL LOW (ref >60)
GFR calc non Af Amer: 21 mL/min — ABNORMAL LOW (ref >60)
Glucose, Bld: 132 mg/dL — ABNORMAL HIGH (ref 70–99)
Potassium: 3.6 mmol/L (ref 3.5–5.1)
Sodium: 141 mmol/L (ref 135–145)

## 2019-12-14 LAB — TROPONIN I (HIGH SENSITIVITY): Troponin I (High Sensitivity): 14 ng/L (ref <18)

## 2019-12-14 LAB — ECHOCARDIOGRAM COMPLETE

## 2019-12-14 MED ORDER — DILTIAZEM HCL ER COATED BEADS 120 MG PO CP24
120.0000 mg | ORAL_CAPSULE | Freq: Every day | ORAL | Status: DC
  Administered 2019-12-14 – 2019-12-17 (×4): 120 mg via ORAL
  Filled 2019-12-14 (×6): qty 1

## 2019-12-14 MED ORDER — APIXABAN 2.5 MG PO TABS
2.5000 mg | ORAL_TABLET | Freq: Two times a day (BID) | ORAL | Status: DC
  Administered 2019-12-14 – 2019-12-17 (×7): 2.5 mg via ORAL
  Filled 2019-12-14 (×9): qty 1

## 2019-12-14 MED ORDER — TORSEMIDE 20 MG PO TABS
20.0000 mg | ORAL_TABLET | Freq: Every day | ORAL | Status: DC
  Filled 2019-12-14 (×2): qty 1

## 2019-12-14 MED ORDER — SODIUM CHLORIDE 0.9% FLUSH
3.0000 mL | Freq: Two times a day (BID) | INTRAVENOUS | Status: DC
  Administered 2019-12-14 – 2019-12-22 (×16): 3 mL via INTRAVENOUS

## 2019-12-14 MED ORDER — ONDANSETRON HCL 4 MG/2ML IJ SOLN: 4.0000 mg | Freq: Four times a day (QID) | INTRAMUSCULAR | Status: DC | PRN

## 2019-12-14 MED ORDER — QUETIAPINE FUMARATE 25 MG PO TABS
25.0000 mg | ORAL_TABLET | Freq: Every day | ORAL | Status: DC
  Administered 2019-12-14 – 2019-12-22 (×9): 25 mg via ORAL
  Filled 2019-12-14 (×9): qty 1

## 2019-12-14 MED ORDER — FUROSEMIDE 10 MG/ML IJ SOLN
40.0000 mg | Freq: Two times a day (BID) | INTRAMUSCULAR | Status: DC
  Administered 2019-12-14 – 2019-12-20 (×14): 40 mg via INTRAVENOUS
  Filled 2019-12-14 (×14): qty 4

## 2019-12-14 MED ORDER — SODIUM CHLORIDE 0.9 % IV SOLN: 250.0000 mL | INTRAVENOUS | Status: DC | PRN

## 2019-12-14 MED ORDER — SODIUM CHLORIDE 0.9% FLUSH: 3.0000 mL | INTRAVENOUS | Status: DC | PRN

## 2019-12-14 MED ORDER — AMIODARONE HCL 200 MG PO TABS
200.0000 mg | ORAL_TABLET | Freq: Every day | ORAL | Status: DC
  Administered 2019-12-14 – 2019-12-17 (×4): 200 mg via ORAL
  Filled 2019-12-14 (×5): qty 1

## 2019-12-14 MED ORDER — ACETAMINOPHEN 325 MG PO TABS
650.0000 mg | ORAL_TABLET | ORAL | Status: DC | PRN
  Administered 2019-12-14 – 2019-12-22 (×16): 650 mg via ORAL
  Filled 2019-12-14 (×16): qty 2

## 2019-12-14 NOTE — Progress Notes (Signed)
PROGRESS NOTE  Kristy Mckinney WPY:[id number] DOB: July 24, 1931 DOA: 12/13/2019 PCP: Celene Squibb, MD  Brief History:  86 year old female with a history of permanent atrial fibrillation, diastolic CHF, GERD, anxiety, dementia, hypertension presenting with a syncopal episode.  The patient states that she was going across her kitchen to put a big potato in the microwave and started feeling dizzy.  The next thing she remembered was waking up on the floor with the potato scattered over the floor.  The patient has denied any recent chest pain, fevers, chills, palpitations, nausea, vomiting, diarrhea, abdominal pain, dysuria.  She recently followed up with her cardiology office on 12/04/2019.  Her torsemide was increased to 20 mg twice daily from 20 mg daily.  She endorses compliance.  However, the patient is not aware of any type of fluid restriction.  She has noted increasing lower extremity edema and just feeling "tired" with ambulation. In the emergency department, the patient was noted to have oxygen saturation of 78% on room air.  She was afebrile hemodynamically stable.  She was placed on supplemental oxygen with oxygen saturation up to 97%.  BMP showed a potassium 3.4 with unremarkable LFTs.  WBC 8.4, hemoglobin 9.4, platelets 304,000.  BNP was 902.  Troponins were unremarkable.  CT of the brain and C-spine were unremarkable.  Chest x-ray showed bilateral pleural effusions, right greater than left.  There is increased interstitial markings.  The patient was started IV furosemide.  Assessment/Plan: Acute respiratory failure with hypoxia -Secondary to pulmonary edema pleural effusion -Presently stable on 3 L nasal cannula -Wean oxygen for saturation greater 92% -Personally reviewed chest x-ray--bilateral pleural effusions, right greater than left.  Increased interstitial markings  Acute on chronic diastolic CHF -Restart IV furosemide 40 mg IV twice daily -The patient remains clinically  fluid overloaded -Accurate I's and Os -Daily weights -Patient states that last weight at home was 150.4 pounds on 12/13/2019  Syncope -Echo -remain on tele -orthostatics -EEG  Permanent atrial fibrillation -Rate controlled -Continue diltiazem CD -We continue amiodarone -Hold metoprolol tartrate for now to allow blood pressure margin for diuresis -Continue apixaban  Cognitive impairment/dementia without behavioral disturbance -Continue Namenda and Seroquel -Continue sertraline  Hypokalemia -Replete -Check magnesium     Status is: Observation  The patient will require care spanning > 2 midnights and should be moved to inpatient because: IV treatments appropriate due to intensity of illness or inability to take PO  Remains fluid overloaded, requires IV lasix  Dispo: The patient is from: Home              Anticipated d/c is to: Home              Anticipated d/c date is: 2 days              Patient currently is not medically stable to d/c.        Family Communication:   No Family at bedside  Consultants:  none  Code Status:  FULL  DVT Prophylaxis: apixaban   Procedures: As Listed in Progress Note Above  Antibiotics: None    Total time spent 35 minutes.  Greater than 50% spent face to face counseling and coordinating care.    Subjective: Patient denies fevers, chills, headache, chest pain, dyspnea, nausea, vomiting, diarrhea, abdominal pain, dysuria, hematuria, hematochezia, and melena.   Objective: Vitals:   12/14/19 0715 12/14/19 0730 12/14/19 0745 12/14/19 0800  BP:  125/65  112/71  Pulse: 87 85 92 78  Resp:      Temp:      TempSrc:      SpO2: 97% 95% 94% 91%   No intake or output data in the 24 hours ending 12/14/19 0844 Weight change:  Exam:   General:  Pt is alert, follows commands appropriately, not in acute distress  HEENT: No icterus, No thrush, No neck mass, [place]/AT  Cardiovascular: IRRR, S1/S2, no rubs, no gallops  +JVD  Respiratory: diminished BS on right. Bibasilar crackles. No wheeze  Abdomen: Soft/+BS, non tender, non distended, no guarding  Extremities: 2+LE edema, No lymphangitis, No petechiae, No rashes, no synovitis   Data Reviewed: I have personally reviewed following labs and imaging studies Basic Metabolic Panel: Recent Labs  Lab 12/13/19 2152  NA 141  K 3.4*  CL 93*  CO2 34*  GLUCOSE 129*  BUN 47*  CREATININE 2.22*  CALCIUM 9.4   Liver Function Tests: Recent Labs  Lab 12/13/19 2152  AST 37  ALT 32  ALKPHOS 127*  BILITOT 1.1  PROT 7.1  ALBUMIN 3.9   No results for input(s): LIPASE, AMYLASE in the last 168 hours. No results for input(s): AMMONIA in the last 168 hours. Coagulation Profile: No results for input(s): INR, PROTIME in the last 168 hours. CBC: Recent Labs  Lab 12/13/19 2152  WBC 8.4  NEUTROABS 6.3  HGB 9.4*  HCT 32.5*  MCV 78.9*  PLT 304   Cardiac Enzymes: No results for input(s): CKTOTAL, CKMB, CKMBINDEX, TROPONINI in the last 168 hours. BNP: Invalid input(s): POCBNP CBG: No results for input(s): GLUCAP in the last 168 hours. HbA1C: No results for input(s): HGBA1C in the last 72 hours. Urine analysis:    Component Value Date/Time   COLORURINE YELLOW 09/10/2019 1248   APPEARANCEUR CLEAR 09/10/2019 1248   LABSPEC 1.018 09/10/2019 1248   PHURINE 6.0 09/10/2019 1248   GLUCOSEU NEGATIVE 09/10/2019 1248   HGBUR NEGATIVE 09/10/2019 1248   BILIRUBINUR NEGATIVE 09/10/2019 1248   [place] 09/10/2019 1248   PROTEINUR 100 (A) 09/10/2019 1248   NITRITE NEGATIVE 09/10/2019 1248   LEUKOCYTESUR NEGATIVE 09/10/2019 1248   Sepsis Labs: @LABRCNTIP (procalcitonin:4,lacticidven:4) ) Recent Results (from the past 240 hour(s))  SARS Coronavirus 2 by RT PCR (hospital order, performed in [place] hospital lab) Nasopharyngeal Nasopharyngeal Swab     Status: None   Collection Time: 12/13/19 10:01 PM   Specimen: Nasopharyngeal Swab  Result  Value Ref Range Status   SARS Coronavirus 2 NEGATIVE NEGATIVE Final    Comment: (NOTE) SARS-CoV-2 target nucleic acids are NOT DETECTED. The SARS-CoV-2 RNA is generally detectable in upper and lower respiratory specimens during the acute phase of infection. The lowest concentration of SARS-CoV-2 viral copies this assay can detect is 250 copies / mL. A negative result does not preclude SARS-CoV-2 infection and should not be used as the sole basis for treatment or other patient management decisions.  A negative result may occur with improper specimen collection / handling, submission of specimen other than nasopharyngeal swab, presence of viral mutation(s) within the areas targeted by this assay, and inadequate number of viral copies (<250 copies / mL). A negative result must be combined with clinical observations, patient history, and epidemiological information. Fact Sheet for Patients:   StrictlyIdeas.no Fact Sheet for Healthcare Providers: BankingDealers.co.za This test is not yet approved or cleared  by the [place] FDA and has been authorized for detection and/or diagnosis of SARS-CoV-2 by FDA under an Emergency Use Authorization (EUA).  This EUA will remain in effect (meaning this test can be used) for the duration of the COVID-19 declaration under Section 564(b)(1) of the Act, 21 U.S.C. section 360bbb-3(b)(1), unless the authorization is terminated or revoked sooner. Performed at [facility], [address] [place], [place] [address]      Scheduled Meds: . apixaban  2.5 mg Oral BID  . furosemide  40 mg Intravenous BID  . sodium chloride flush  3 mL Intravenous Q12H   Continuous Infusions: . sodium chloride      Procedures/Studies: CT Head Wo Contrast  Result Date: 12/13/2019 CLINICAL DATA:  [place], back pain EXAM: CT HEAD WITHOUT CONTRAST CT CERVICAL SPINE WITHOUT CONTRAST TECHNIQUE: Multidetector CT imaging of the head  and cervical spine was performed following the standard protocol without intravenous contrast. Multiplanar CT image reconstructions of the cervical spine were also generated. COMPARISON:  11/28/2018 FINDINGS: CT HEAD FINDINGS Brain: Chronic small vessel ischemic changes are seen within the periventricular white matter. No acute infarct or hemorrhage. Lateral ventricles and midline structures are unremarkable. No acute extra-axial fluid collections. No mass effect. Vascular: No hyperdense vessel or unexpected calcification. Skull: There is a small ex of little scalp hematoma. No underlying fracture. Remainder of the calvarium is unremarkable. Sinuses/Orbits: No acute finding. Other: None. CT CERVICAL SPINE FINDINGS Alignment: Alignment is grossly anatomic. Skull base and vertebrae: No acute displaced fractures. Soft tissues and spinal canal: No prevertebral fluid or swelling. No visible canal hematoma. Disc levels: Disc space narrowing and osteophyte formation is seen from C3-4 through C6-7, most pronounced at the C5-6 level. At C3-4 there is minimal symmetrical neural foraminal encroachment. At C4-5 there is mild central stenosis with bilateral right greater than left neural foraminal encroachment. At C5-6 there is at least moderate to severe central canal stenosis with significant right greater than left neural foraminal encroachment. At C6-7 there is mild central stenosis with severe right greater than left neural foraminal encroachment. Upper chest: Airway is patent. Large right pleural effusion partially visualized. Left apex is clear. Other: Reconstructed images demonstrate no additional findings. IMPRESSION: 1. No acute intracranial process. 2. No acute cervical spine fracture. 3. Significant multilevel cervical spondylosis, most pronounced at C5-6. Significant but less severe changes are seen at C4-5 and C6-7 as above. 4. Large right pleural effusion. Electronically Signed   By: Randa Ngo M.D.   On:  12/13/2019 23:22   CT Cervical Spine Wo Contrast  Result Date: 12/13/2019 CLINICAL DATA:  [place], back pain EXAM: CT HEAD WITHOUT CONTRAST CT CERVICAL SPINE WITHOUT CONTRAST TECHNIQUE: Multidetector CT imaging of the head and cervical spine was performed following the standard protocol without intravenous contrast. Multiplanar CT image reconstructions of the cervical spine were also generated. COMPARISON:  11/28/2018 FINDINGS: CT HEAD FINDINGS Brain: Chronic small vessel ischemic changes are seen within the periventricular white matter. No acute infarct or hemorrhage. Lateral ventricles and midline structures are unremarkable. No acute extra-axial fluid collections. No mass effect. Vascular: No hyperdense vessel or unexpected calcification. Skull: There is a small ex of little scalp hematoma. No underlying fracture. Remainder of the calvarium is unremarkable. Sinuses/Orbits: No acute finding. Other: None. CT CERVICAL SPINE FINDINGS Alignment: Alignment is grossly anatomic. Skull base and vertebrae: No acute displaced fractures. Soft tissues and spinal canal: No prevertebral fluid or swelling. No visible canal hematoma. Disc levels: Disc space narrowing and osteophyte formation is seen from C3-4 through C6-7, most pronounced at the C5-6 level. At C3-4 there is minimal symmetrical neural foraminal encroachment. At C4-5  there is mild central stenosis with bilateral right greater than left neural foraminal encroachment. At C5-6 there is at least moderate to severe central canal stenosis with significant right greater than left neural foraminal encroachment. At C6-7 there is mild central stenosis with severe right greater than left neural foraminal encroachment. Upper chest: Airway is patent. Large right pleural effusion partially visualized. Left apex is clear. Other: Reconstructed images demonstrate no additional findings. IMPRESSION: 1. No acute intracranial process. 2. No acute cervical spine fracture. 3.  Significant multilevel cervical spondylosis, most pronounced at C5-6. Significant but less severe changes are seen at C4-5 and C6-7 as above. 4. Large right pleural effusion. Electronically Signed   By: Randa Ngo M.D.   On: 12/13/2019 23:22   DG Pelvis Portable  Result Date: 12/13/2019 CLINICAL DATA:  [place], back pain EXAM: PORTABLE PELVIS 1-2 VIEWS COMPARISON:  None. FINDINGS: Single frontal view of the pelvis demonstrates postsurgical changes right hip. No acute displaced fracture. Left convex scoliosis lumbar spine. Sacroiliac joints are normal. Soft tissues are unremarkable. IMPRESSION: 1. No acute displaced fracture. Electronically Signed   By: Randa Ngo M.D.   On: 12/13/2019 22:46   DG Chest Portable 1 View  Result Date: 12/13/2019 CLINICAL DATA:  [place], pain EXAM: PORTABLE CHEST 1 VIEW COMPARISON:  10/13/2019 FINDINGS: Single frontal view of the chest demonstrates an enlarged right pleural effusion with right basilar atelectasis. Left basilar consolidation unchanged. No pneumothorax. Cardiac silhouette is stable. Atherosclerosis aortic arch. No acute displaced fractures. IMPRESSION: 1. Enlarging right pleural effusion. 2. Persistent bibasilar areas of consolidation likely atelectasis, increased on the right since prior study. Electronically Signed   By: Randa Ngo M.D.   On: 12/13/2019 22:43    Orson Eva, DO  [facility]  If 7PM-7AM, please contact night-coverage [email or web address] Password [email or web address] 12/14/2019, 8:44 AM   LOS: 0 days

## 2019-12-14 NOTE — H&P (Signed)
History and Physical    MARIANNE GOLIGHTLY WSF:[id number] DOB: 1932/05/23 DOA: 12/13/2019  PCP: Celene Squibb, MD (Confirm with patient/family/NH records and if not entered, this has to be entered at [facility] point of entry) Patient coming from: Home  I have personally briefly reviewed patient's old medical records in [place]  Chief Complaint: Fall, generalized weakness and dyspnea  HPI: Kristy Mckinney is a 86 y.o. female with medical history significant of hypertension, hyperlipidemia, atrial fibrillation on Eliquis, dementia, iron deficiency anemia and diastolic heart failure presented to ED for evaluation of fall, generalized weakness and shortness of breath with hypoxia.  As per ED physician, patient's daughter reported that patient is recently feeling very weak and had a fall today and was unable to get up and when the daughter reached her home her oxygen saturation was in 20s.  During my evaluation patient's daughter is not present in the room.  Patient states that she is feeling very weak and tired and also complaining of dyspnea with exertion and orthopnea with swelling in bilateral lower extremities.  Patient states that she takes her diuretic regularly as prescribed but still she felt that her legs are getting more swollen for the last 3 to 4 days.  Patient otherwise denies fever, chills, chest pain, nausea, vomiting, abdominal pain and urinary symptoms.  (For level 3, the HPI must include 4+ descriptors: Location, Quality, Severity, Duration, Timing, Context, modifying factors, associated signs/symptoms and/or status of 3+ chronic problems.)  (Please avoid self-populating past medical history here) (The initial 2-3 lines should be focused and good to copy and paste in the HPI section of the daily progress note).  ED Course: On arrival to the ED patient had blood pressure of 117/63, heart rate 77, temperature 97.7 and 3 L of oxygen with nasal cannula that improved her oxygen saturation  to 94%.  Blood work showed WBC 8.4, hemoglobin 9.4, sodium 141, potassium 3.4, BUN 47, creatinine 2.2, blood glucose 129.  BNP 902.  Chest x-ray showed enlarged right-sided pleural effusion.  CT head negative for acute intracranial bleed or pathology cervical spine also negative for acute fracture or dislocation.  1 dose of IV Lasix 40 mg in the ED and her dyspnea improved and she also mentioned that bilateral lower extremity edema also improved.  Review of Systems: As per HPI otherwise 10 point review of systems negative.  Unacceptable ROS statements: 10 systems reviewed, Extensive (without elaboration).  Acceptable ROS statements: All others negative, All others reviewed and are negative, and All others unremarkable, with at [place] documented Cant double dip - if using for HPI cant use for ROS  Past Medical History:  Diagnosis Date   Abdominal distension (gaseous)    Acute on chronic diastolic heart failure (HCC)    Anxiety    Atrial fibrillation (HCC)    Chronic back pain    Dementia (HCC)    Disorder of left temporomandibular joint    Dysphagia 05/03/2016   Essential hypertension    GERD (gastroesophageal reflux disease)    History of bronchitis    Iron deficiency anemia    Irritable bowel syndrome with diarrhea    Osteoporosis    Pneumonia     Past Surgical History:  Procedure Laterality Date   ABDOMINAL HYSTERECTOMY     BACK SURGERY     CHOLECYSTECTOMY N/A 01/29/2014   Procedure: LAPAROSCOPIC CHOLECYSTECTOMY;  Surgeon: Scherry Ran, MD;  Location: [facility];  Service: General;  Laterality: N/A;  COLONOSCOPY N/A 10/27/2015   Procedure: COLONOSCOPY;  Surgeon: Rogene Houston, MD;  Location: [facility];  Service: Endoscopy;  Laterality: N/A;  215   INTRAMEDULLARY (IM) NAIL INTERTROCHANTERIC Right 11/29/2018   Procedure: INTRAMEDULLARY (IM) NAIL INTERTROCHANTRIC FRACTURE;  Surgeon: Renette Butters, MD;  Location: [place];  Service:  Orthopedics;  Laterality: Right;   TONSILLECTOMY     YAG LASER APPLICATION Left 46/27/0350   Procedure: YAG LASER APPLICATION;  Surgeon: Elta Guadeloupe T. Gershon Crane, MD;  Location: [facility];  Service: Ophthalmology;  Laterality: Left;  left     reports that she has never smoked. She has never used smokeless tobacco. She reports that she does not drink alcohol or use drugs.  Allergies  Allergen Reactions   Penicillins Anaphylaxis    Did it involve swelling of the face/tongue/throat, SOB, or low BP? Unknown Did it involve sudden or severe rash/hives, skin peeling, or any reaction on the inside of your mouth or nose? Unknown Did you need to seek medical attention at a hospital or doctor's office? Unknown When did it last happen? If all above answers are NO, may proceed with cephalosporin use.    Biaxin [Clarithromycin]    Ciprofloxacin Nausea And Vomiting   Emetrol Nausea Only    swelling   Feldene [Piroxicam] Nausea And Vomiting   Sulfa Antibiotics     swelling    Family History  Problem Relation Age of Onset   Hypertension Child    Family history reviewed and positive for hypertension Unacceptable: Noncontributory, unremarkable, or negative. Acceptable: (example)Family history negative for heart disease  Prior to Admission medications   Medication Sig Start Date End Date Taking? Authorizing Provider  acetaminophen (TYLENOL) 325 MG tablet Take 650 mg by mouth every 6 (six) hours as needed. 12/05/18   [provider]  alendronate (FOSAMAX) 70 MG tablet Take 1 tablet (70 mg total) by mouth once a week. Take with a full glass of water on an empty stomach. 11/26/19   Corum, Rex Kras, MD  amiodarone (PACERONE) 200 MG tablet Take 200 mg by mouth daily.    [provider]  apixaban (ELIQUIS) 5 MG TABS tablet Take 1 tablet (5 mg total) by mouth 2 (two) times daily. 12/12/19   Verta Ellen., NP  calcium-vitamin D (OS-CAL 500 + D) 500-200 MG-UNIT tablet Take 1 tablet  by mouth 2 (two) times daily. 12/05/18   Desiree Hane, MD  diltiazem (CARDIZEM CD) 180 MG 24 hr capsule Take 1 capsule (180 mg total) by mouth daily. 11/26/19 11/25/20  [name], MD  ferrous sulfate 325 (65 FE) MG EC tablet Take 1 tablet (325 mg total) by mouth daily with breakfast. 12/03/19 12/02/20  Laurine Blazer A, PA-C  magnesium oxide (MAG-OX) 400 MG tablet Take 1 tablet (400 mg total) by mouth 3 (three) times daily. 12/23/18   Gerlene Fee, NP  memantine (NAMENDA) 5 MG tablet Take 1 tablet (5 mg total) by mouth 2 (two) times daily. 11/26/19   Corum, Rex Kras, MD  metoprolol tartrate (LOPRESSOR) 25 MG tablet Take 0.5 tablets (12.5 mg total) by mouth 2 (two) times daily. 12/12/19   Verta Ellen., NP  pantoprazole (PROTONIX) 40 MG tablet TAKE (1) TABLET BY MOUTH DAILY. 11/26/19   Corum, Rex Kras, MD  Polyethyl Glycol-Propyl Glycol (SYSTANE OP) Place 1 drop into both eyes daily as needed. Dry Eyes     [provider]  potassium chloride (KLOR-CON) 10 MEQ tablet Take 1 tablet (  10 mEq total) by mouth daily. For 7 days-further instructions given at follow up visit 12/05/19 03/04/20  Verta Ellen., NP  QUEtiapine (SEROQUEL) 25 MG tablet Take 1 tablet (25 mg total) by mouth at bedtime. 12/23/18   Gerlene Fee, NP  sertraline (ZOLOFT) 50 MG tablet Take 1 tablet (50 mg total) by mouth every morning. 12/23/18   Gerlene Fee, NP  torsemide (DEMADEX) 20 MG tablet Take 1 tablet (20 mg total) by mouth 2 (two) times daily. For 7 days-further instructions to be given at follow up appointment next week 12/04/19   Verta Ellen., NP    Physical Exam: Vitals:   12/14/19 0430 12/14/19 0500 12/14/19 0600 12/14/19 0630  BP: 103/62 (!) 118/57 101/64 (!) 108/96  Pulse: 78  77 93  Resp:      Temp:      TempSrc:      SpO2: 93%  92% 93%    Constitutional: NAD, calm, comfortable Vitals:   12/14/19 0430 12/14/19 0500 12/14/19 0600 12/14/19 0630  BP: 103/62 (!) 118/57 101/64 (!)  108/96  Pulse: 78  77 93  Resp:      Temp:      TempSrc:      SpO2: 93%  92% 93%   Eyes: PERRL, lids and conjunctivae normal ENMT: Mucous membranes are moist. Posterior pharynx clear of any exudate or lesions.Normal dentition.  Neck: normal, supple, no masses, no thyromegaly Respiratory: clear to auscultation bilaterally, no wheezing, no crackles. Normal respiratory effort. No accessory muscle use.  Cardiovascular: Regular rate and rhythm, no murmurs / rubs / gallops. No extremity edema. 2+ pedal pulses. No carotid bruits.  Abdomen: no tenderness, no masses palpated. No hepatosplenomegaly. Bowel sounds positive.  Musculoskeletal: no clubbing / cyanosis. No joint deformity upper and lower extremities. Good ROM, no contractures. Normal muscle tone.  Skin: no rashes, lesions, ulcers. No induration Neurologic: CN 2-12 grossly intact. Sensation intact, DTR normal. Strength 5/5 in all 4.  Psychiatric: Normal judgment and insight. Alert and oriented x 3. Normal mood.   (Anything < 9 systems with 2 bullets each down codes to level 1) (If patient refuses exam cant bill higher level) (Make sure to document decubitus ulcers present on admission -- if possible -- and whether patient has chronic indwelling catheter at time of admission)  Labs on Admission: I have personally reviewed following labs and imaging studies  CBC: Recent Labs  Lab 12/13/19 2152  WBC 8.4  NEUTROABS 6.3  HGB 9.4*  HCT 32.5*  MCV 78.9*  PLT 376   Basic Metabolic Panel: Recent Labs  Lab 12/13/19 2152  NA 141  K 3.4*  CL 93*  CO2 34*  GLUCOSE 129*  BUN 47*  CREATININE 2.22*  CALCIUM 9.4   GFR: Estimated Creatinine Clearance: 16.3 mL/min (A) (by C-G formula based on SCr of 2.22 mg/dL (H)). Liver Function Tests: Recent Labs  Lab 12/13/19 2152  AST 37  ALT 32  ALKPHOS 127*  BILITOT 1.1  PROT 7.1  ALBUMIN 3.9   No results for input(s): LIPASE, AMYLASE in the last 168 hours. No results for input(s):  AMMONIA in the last 168 hours. Coagulation Profile: No results for input(s): INR, PROTIME in the last 168 hours. Cardiac Enzymes: No results for input(s): CKTOTAL, CKMB, CKMBINDEX, TROPONINI in the last 168 hours. BNP (last 3 results) No results for input(s): PROBNP in the last 8760 hours. HbA1C: No results for input(s): HGBA1C in the last 72 hours. CBG: No results  for input(s): GLUCAP in the last 168 hours. Lipid Profile: No results for input(s): CHOL, HDL, LDLCALC, TRIG, CHOLHDL, LDLDIRECT in the last 72 hours. Thyroid Function Tests: No results for input(s): TSH, T4TOTAL, FREET4, T3FREE, THYROIDAB in the last 72 hours. Anemia Panel: No results for input(s): VITAMINB12, FOLATE, FERRITIN, TIBC, IRON, RETICCTPCT in the last 72 hours. Urine analysis:    Component Value Date/Time   COLORURINE YELLOW 09/10/2019 1248   APPEARANCEUR CLEAR 09/10/2019 1248   LABSPEC 1.018 09/10/2019 1248   PHURINE 6.0 09/10/2019 1248   GLUCOSEU NEGATIVE 09/10/2019 1248   HGBUR NEGATIVE 09/10/2019 [place] 09/10/2019 [place] 09/10/2019 1248   PROTEINUR 100 (A) 09/10/2019 1248   NITRITE NEGATIVE 09/10/2019 [place] 09/10/2019 1248    Radiological Exams on Admission: CT Head Wo Contrast  Result Date: 12/13/2019 CLINICAL DATA:  [place], back pain EXAM: CT HEAD WITHOUT CONTRAST CT CERVICAL SPINE WITHOUT CONTRAST TECHNIQUE: Multidetector CT imaging of the head and cervical spine was performed following the standard protocol without intravenous contrast. Multiplanar CT image reconstructions of the cervical spine were also generated. COMPARISON:  11/28/2018 FINDINGS: CT HEAD FINDINGS Brain: Chronic small vessel ischemic changes are seen within the periventricular white matter. No acute infarct or hemorrhage. Lateral ventricles and midline structures are unremarkable. No acute extra-axial fluid collections. No mass effect. Vascular: No hyperdense vessel or  unexpected calcification. Skull: There is a small ex of little scalp hematoma. No underlying fracture. Remainder of the calvarium is unremarkable. Sinuses/Orbits: No acute finding. Other: None. CT CERVICAL SPINE FINDINGS Alignment: Alignment is grossly anatomic. Skull base and vertebrae: No acute displaced fractures. Soft tissues and spinal canal: No prevertebral fluid or swelling. No visible canal hematoma. Disc levels: Disc space narrowing and osteophyte formation is seen from C3-4 through C6-7, most pronounced at the C5-6 level. At C3-4 there is minimal symmetrical neural foraminal encroachment. At C4-5 there is mild central stenosis with bilateral right greater than left neural foraminal encroachment. At C5-6 there is at least moderate to severe central canal stenosis with significant right greater than left neural foraminal encroachment. At C6-7 there is mild central stenosis with severe right greater than left neural foraminal encroachment. Upper chest: Airway is patent. Large right pleural effusion partially visualized. Left apex is clear. Other: Reconstructed images demonstrate no additional findings. IMPRESSION: 1. No acute intracranial process. 2. No acute cervical spine fracture. 3. Significant multilevel cervical spondylosis, most pronounced at C5-6. Significant but less severe changes are seen at C4-5 and C6-7 as above. 4. Large right pleural effusion. Electronically Signed   By: Randa Ngo M.D.   On: 12/13/2019 23:22   CT Cervical Spine Wo Contrast  Result Date: 12/13/2019 CLINICAL DATA:  [place], back pain EXAM: CT HEAD WITHOUT CONTRAST CT CERVICAL SPINE WITHOUT CONTRAST TECHNIQUE: Multidetector CT imaging of the head and cervical spine was performed following the standard protocol without intravenous contrast. Multiplanar CT image reconstructions of the cervical spine were also generated. COMPARISON:  11/28/2018 FINDINGS: CT HEAD FINDINGS Brain: Chronic small vessel ischemic changes are seen  within the periventricular white matter. No acute infarct or hemorrhage. Lateral ventricles and midline structures are unremarkable. No acute extra-axial fluid collections. No mass effect. Vascular: No hyperdense vessel or unexpected calcification. Skull: There is a small ex of little scalp hematoma. No underlying fracture. Remainder of the calvarium is unremarkable. Sinuses/Orbits: No acute finding. Other: None. CT CERVICAL SPINE FINDINGS Alignment: Alignment is grossly anatomic. Skull base and vertebrae: No  acute displaced fractures. Soft tissues and spinal canal: No prevertebral fluid or swelling. No visible canal hematoma. Disc levels: Disc space narrowing and osteophyte formation is seen from C3-4 through C6-7, most pronounced at the C5-6 level. At C3-4 there is minimal symmetrical neural foraminal encroachment. At C4-5 there is mild central stenosis with bilateral right greater than left neural foraminal encroachment. At C5-6 there is at least moderate to severe central canal stenosis with significant right greater than left neural foraminal encroachment. At C6-7 there is mild central stenosis with severe right greater than left neural foraminal encroachment. Upper chest: Airway is patent. Large right pleural effusion partially visualized. Left apex is clear. Other: Reconstructed images demonstrate no additional findings. IMPRESSION: 1. No acute intracranial process. 2. No acute cervical spine fracture. 3. Significant multilevel cervical spondylosis, most pronounced at C5-6. Significant but less severe changes are seen at C4-5 and C6-7 as above. 4. Large right pleural effusion. Electronically Signed   By: Randa Ngo M.D.   On: 12/13/2019 23:22   DG Pelvis Portable  Result Date: 12/13/2019 CLINICAL DATA:  [place], back pain EXAM: PORTABLE PELVIS 1-2 VIEWS COMPARISON:  None. FINDINGS: Single frontal view of the pelvis demonstrates postsurgical changes right hip. No acute displaced fracture. Left convex  scoliosis lumbar spine. Sacroiliac joints are normal. Soft tissues are unremarkable. IMPRESSION: 1. No acute displaced fracture. Electronically Signed   By: Randa Ngo M.D.   On: 12/13/2019 22:46   DG Chest Portable 1 View  Result Date: 12/13/2019 CLINICAL DATA:  [place], pain EXAM: PORTABLE CHEST 1 VIEW COMPARISON:  10/13/2019 FINDINGS: Single frontal view of the chest demonstrates an enlarged right pleural effusion with right basilar atelectasis. Left basilar consolidation unchanged. No pneumothorax. Cardiac silhouette is stable. Atherosclerosis aortic arch. No acute displaced fractures. IMPRESSION: 1. Enlarging right pleural effusion. 2. Persistent bibasilar areas of consolidation likely atelectasis, increased on the right since prior study. Electronically Signed   By: Randa Ngo M.D.   On: 12/13/2019 22:43      Assessment/Plan Principal Problem:   Acute exacerbation of CHF (congestive heart failure) ([place]) Patient is having acute exacerbation of diastolic heart failure.  BNP is 902 and x-ray chest is showing right-sided pleural effusion.  Last echocardiogram was done in September 2020 and it showed diastolic heart failure.  Patient was given IV Lasix 40 mg in the ED. Daily weights with intake and output monitoring ordered Low-salt diet ordered Patient encouraged about medication compliance.  Continue home torsemide 20 mg twice daily.  Active Problems:  Acute hypoxic respiratory failure secondary to CHF exacerbation Continue oxygen supplementation with nasal cannula to maintain oxygen saturation above 92%.    Atrial fibrillation with RVR ([place]) Stable Continue home medications for atrial fibrillation after confirmation by the pharmacy.  Continue home Eliquis.  Hypertension Stable Continue home medications after formation by pharmacy  Hyperlipidemia Home medications after confirmation by pharmacy  Anxiety/depression Home medication after confirmation by pharmacy.    DVT  prophylaxis: Eliquis 5 mg twice daily Code Status: Full code Family Communication: No family member at the bedside Disposition Plan: Patient will be discharged to home after resolution of symptoms Consults called: None Admission status: Observation/telemetry   Edmonia Lynch MD [facility] Pager [phone]   If 7PM-7AM, please contact night-coverage [email or web address] Password [facility]  12/14/2019, 6:45 AM

## 2019-12-14 NOTE — ED Notes (Signed)
Patient states she is unable to stand for orthostatics

## 2019-12-14 NOTE — Progress Notes (Signed)
  Echocardiogram 2D Echocardiogram has been performed.  Kristy Mckinney 12/14/2019, 11:09 AM

## 2019-12-15 ENCOUNTER — Inpatient Hospital Stay (HOSPITAL_COMMUNITY): Payer: PPO

## 2019-12-15 ENCOUNTER — Telehealth (INDEPENDENT_AMBULATORY_CARE_PROVIDER_SITE_OTHER): Payer: Self-pay | Admitting: *Deleted

## 2019-12-15 DIAGNOSIS — Z66 Do not resuscitate: Secondary | ICD-10-CM

## 2019-12-15 DIAGNOSIS — Z7189 Other specified counseling: Secondary | ICD-10-CM

## 2019-12-15 DIAGNOSIS — J90 Pleural effusion, not elsewhere classified: Secondary | ICD-10-CM

## 2019-12-15 DIAGNOSIS — I5033 Acute on chronic diastolic (congestive) heart failure: Secondary | ICD-10-CM

## 2019-12-15 DIAGNOSIS — I4891 Unspecified atrial fibrillation: Secondary | ICD-10-CM

## 2019-12-15 DIAGNOSIS — R55 Syncope and collapse: Secondary | ICD-10-CM

## 2019-12-15 DIAGNOSIS — Z515 Encounter for palliative care: Secondary | ICD-10-CM

## 2019-12-15 LAB — BASIC METABOLIC PANEL
Anion gap: 14 (ref 5–15)
BUN: 39 mg/dL — ABNORMAL HIGH (ref 8–23)
CO2: 31 mmol/L (ref 22–32)
Calcium: 9.1 mg/dL (ref 8.9–10.3)
Chloride: 94 mmol/L — ABNORMAL LOW (ref 98–111)
Creatinine, Ser: 1.89 mg/dL — ABNORMAL HIGH (ref 0.44–1.00)
GFR calc Af Amer: 28 mL/min — ABNORMAL LOW (ref >60)
GFR calc non Af Amer: 24 mL/min — ABNORMAL LOW (ref >60)
Glucose, Bld: 116 mg/dL — ABNORMAL HIGH (ref 70–99)
Potassium: 3.3 mmol/L — ABNORMAL LOW (ref 3.5–5.1)
Sodium: 139 mmol/L (ref 135–145)

## 2019-12-15 LAB — BODY FLUID CELL COUNT WITH DIFFERENTIAL
Eos, Fluid: 0 %
Lymphs, Fluid: 42 %
Monocyte-Macrophage-Serous Fluid: 26 % — ABNORMAL LOW (ref 50–90)
Neutrophil Count, Fluid: 32 % — ABNORMAL HIGH (ref 0–25)
Total Nucleated Cell Count, Fluid: 84 cu mm (ref 0–1000)

## 2019-12-15 LAB — MAGNESIUM: Magnesium: 2.3 mg/dL (ref 1.7–2.4)

## 2019-12-15 LAB — GRAM STAIN

## 2019-12-15 LAB — LACTATE DEHYDROGENASE, PLEURAL OR PERITONEAL FLUID: LD, Fluid: 67 U/L — ABNORMAL HIGH (ref 3–23)

## 2019-12-15 MED ORDER — SERTRALINE HCL 50 MG PO TABS
50.0000 mg | ORAL_TABLET | Freq: Every day | ORAL | Status: DC
  Administered 2019-12-15 – 2019-12-22 (×8): 50 mg via ORAL
  Filled 2019-12-15 (×8): qty 1

## 2019-12-15 MED ORDER — MEMANTINE HCL 10 MG PO TABS
5.0000 mg | ORAL_TABLET | Freq: Two times a day (BID) | ORAL | Status: DC
  Administered 2019-12-15 – 2019-12-23 (×17): 5 mg via ORAL
  Filled 2019-12-15 (×17): qty 1

## 2019-12-15 NOTE — TOC Initial Note (Signed)
Transition of Care [facility]) - Initial/Assessment Note    Patient Details  Name: Kristy Mckinney MRN: [id number] Date of Birth: 1932/05/15  Transition of Care [facility]) CM/SW Contact:    Salome Arnt, [place] Phone Number: 12/15/2019, 9:39 AM  Clinical Narrative:  Pt admitted from home with CHF. LCSW spoke with pt to complete assessment. Pt reports she lives alone, but has significant family support. Family provide most meals for pt. She is fairly independent with ADLs. Pt states she occasionally uses a walker and uses oxygen PRN. Per previous notes, pt has had home health through [facility]. Pt states that services stopped last week. PT evaluation pending. Palliative consult also pending. LCSW completed CHF screening. Pt states she takes daily weights and follows heart healthy diet. She takes medications as prescribed and is followed by Dr. Domenic Polite.  Pt plans to return home at d/c and does not feel she has any needs at this time. TOC will continue to follow. LCSW confirmed PCP appointment scheduled for 6/14 at 3:00 and appointment is on AVS.               Expected Discharge Plan: Home/Self Care Barriers to Discharge: Continued Medical Work up   Patient Goals and CMS Choice Patient states their goals for this hospitalization and ongoing recovery are:: return home      Expected Discharge Plan and Services Expected Discharge Plan: Home/Self Care In-house Referral: Clinical Social Work Discharge Planning Services: Follow-up appt scheduled   Living arrangements for the past 2 months: Apartment                                      Prior Living Arrangements/Services Living arrangements for the past 2 months: Apartment Lives with:: Self Patient language and need for interpreter reviewed:: Yes Do you feel safe going back to the place where you live?: Yes      Need for Family Participation in Patient Care: Yes (Comment) Care giver support system in place?: Yes (comment) Current  home services: DME(walker, oxygen) Criminal Activity/Legal Involvement Pertinent to Current Situation/Hospitalization: No - Comment as needed  Activities of Daily Living Home Assistive Devices/Equipment: Cane (specify quad or straight), Walker (specify type), Eyeglasses, Shower chair without back ADL Screening (condition at time of admission) Patient's cognitive ability adequate to safely complete daily activities?: Yes Is the patient deaf or have difficulty hearing?: Yes Does the patient have difficulty seeing, even when wearing glasses/contacts?: No Does the patient have difficulty concentrating, remembering, or making decisions?: Yes Patient able to express need for assistance with ADLs?: Yes Does the patient have difficulty dressing or bathing?: No Independently performs ADLs?: Yes (appropriate for developmental age) Does the patient have difficulty walking or climbing stairs?: Yes Weakness of Legs: Both Weakness of Arms/Hands: None  Permission Sought/Granted                  Emotional Assessment Appearance:: Appears stated age Attitude/Demeanor/Rapport: Engaged Affect (typically observed): Accepting Orientation: : Oriented to Self, Oriented to Place, Oriented to  Time, Oriented to Situation Alcohol / Substance Use: Not Applicable    Admission diagnosis:  Acute exacerbation of CHF (congestive heart failure) (HCC) [I50.9] Acute respiratory failure with hypoxia (HCC) [W09.81] Systolic congestive heart failure, unspecified HF chronicity ([place]) [I50.20] Patient Active Problem List   Diagnosis Date Noted  . Acute exacerbation of CHF (congestive heart failure) ([place]) 12/14/2019  . Syncope and collapse 12/14/2019  .  Acute respiratory failure ([place]) 10/12/2019  . Elevated glucose 08/20/2019  . Degenerative lumbar disc 08/20/2019  . Left hip pain 08/20/2019  . Osteoporosis   . Anemia, unspecified   . Anxiety disorder, unspecified   . Vitamin D deficiency, unspecified   .  Essential (primary) hypertension   . Abdominal distension (gaseous)   . Disorder of the skin and subcutaneous tissue, unspecified   . Gastro-esophageal reflux disease with esophagitis   . Iron deficiency anemia, unspecified   . Irritable bowel syndrome with diarrhea   . Left temporomandibular joint disorder, unspecified   . Unspecified dementia without behavioral disturbance ([place])   . Unspecified fracture of unspecified wrist and hand, initial encounter for closed fracture   . Advanced care planning/counseling discussion   . Acute on chronic diastolic congestive heart failure ([place]) 05/02/2019  . Acute on chronic diastolic CHF (congestive heart failure) ([place]) 05/02/2019  . Palliative care by specialist   . Goals of care, counseling/discussion   . Generalized weakness   . Pleural effusion 04/13/2019  . Pancreatitis, acute 04/13/2019  . Acute respiratory failure with hypoxia ([place]) 04/13/2019  . Bradycardia 04/10/2019  . Hypotension 04/10/2019  . Hyperkalemia 04/10/2019  . ARF (acute renal failure) ([place]) 04/10/2019  . Atrial fibrillation ([place]) 04/06/2019  . A-fib ([place]) 04/04/2019  . Dementia ([place]) 12/12/2018  . GERD without esophagitis 12/06/2018  . Post-menopausal osteoporosis 12/06/2018  . Acute delirium 12/06/2018  . Hypomagnesemia 12/06/2018  . Acute blood loss anemia 12/06/2018  . Closed fracture of right femur, unspecified fracture morphology, initial encounter ([place]) 11/28/2018  . Acute diastolic CHF (congestive heart failure) ([place]) 11/28/2018  . Fracture, intertrochanteric, right femur ([place]) 11/28/2018  . Atrial fibrillation with rapid ventricular response ([place]) 11/25/2018  . Atrial fibrillation with RVR ([place]) 11/24/2018  . Chronic back pain 11/24/2018  . Anxiety and depression 11/24/2018  . Chronic constipation 05/03/2016  . Dysphagia 05/03/2016  . Cholecystitis with cholelithiasis 01/29/2014   PCP:  Celene Squibb, MD Pharmacy:   [facility] [place]  [place] [address] [place] [address] Phone: [phone] Fax: [phone]  [facility] - [address] [place], [place] [address] Phone: [phone] Fax: [phone]     Social Determinants of Health (SDOH) Interventions    Readmission Risk Interventions Readmission Risk Prevention Plan 12/15/2019 09/12/2019 05/10/2019  Post Dischage Appt - - -  Appt Comments - - -  Medication Screening - - -  Transportation Screening Complete Complete Complete  Medication Review ([place]) Complete Complete Complete  PCP or Specialist appointment within 3-5 days of discharge Complete - Complete  HRI or Home Care Consult Complete Complete Complete  SW Recovery Care/Counseling Consult Complete Complete Complete  Palliative Care Screening Complete Not Applicable Complete  [place] Not Applicable Not Applicable Not Applicable  Some recent data might be hidden

## 2019-12-15 NOTE — Procedures (Signed)
Patient Name: Kristy Mckinney  MRN: [id number]  Epilepsy Attending: Lora Havens  Referring Physician/Provider: Dr. Shanon Brow [place] Date: 12/15/2019 Duration: 23.39 minutes  Patient history: 26 old female with syncope.  EEG evaluate for seizures.  Level of alertness: Awake  AEDs during EEG study: None  Technical aspects: This EEG study was done with scalp electrodes positioned according to the 10-20 International system of electrode placement. Electrical activity was acquired at a sampling rate of 500Hz  and reviewed with a high frequency filter of 70Hz  and a low frequency filter of 1Hz . EEG data were recorded continuously and digitally stored.   Description: The posterior dominant rhythm consists of 8 Hz activity of moderate voltage (25-35 uV) seen predominantly in posterior head regions, symmetric and reactive to eye opening and eye closing. EEG showed intermittent generalized 3 to 6 Hz theta-delta slowing. Hyperventilation and photic stimulation were not performed.     ABNORMALITY -Intermittent slow, generalized  IMPRESSION: This study is suggestive of mild diffuse encephalopathy, nonspecific etiology. No seizures or epileptiform discharges were seen throughout the recording.  Brendaly Townsel Barbra Sarks

## 2019-12-15 NOTE — Telephone Encounter (Signed)
Case discussed w/ daughter who agrees w/ holding off on colonoscopy. Cbc stable in patient    Mitzie - please schedule a f/up appt w/ me in about 6 weeks (daughter aware you will be calling to schedule this) Thanks.

## 2019-12-15 NOTE — Progress Notes (Signed)
PROGRESS NOTE  Kristy Mckinney PFX:[id number] DOB: 05/13/1932 DOA: 12/13/2019 PCP: Celene Squibb, MD  Brief History:  86 year old female with a history of permanent atrial fibrillation, diastolic CHF, GERD, anxiety, dementia, hypertension presenting with a syncopal episode.  The patient states that she was going across her kitchen to put a big potato in the microwave and started feeling dizzy.  The next thing she remembered was waking up on the floor with the potato scattered over the floor.  The patient has denied any recent chest pain, fevers, chills, palpitations, nausea, vomiting, diarrhea, abdominal pain, dysuria.  She recently followed up with her cardiology office on 12/04/2019.  Her torsemide was increased to 20 mg twice daily from 20 mg daily.  She endorses compliance.  However, the patient is not aware of any type of fluid restriction.  She has noted increasing lower extremity edema and just feeling "tired" with ambulation. In the emergency department, the patient was noted to have oxygen saturation of 78% on room air.  She was afebrile hemodynamically stable.  She was placed on supplemental oxygen with oxygen saturation up to 97%.  BMP showed a potassium 3.4 with unremarkable LFTs.  WBC 8.4, hemoglobin 9.4, platelets 304,000.  BNP was 902.  Troponins were unremarkable.  CT of the brain and C-spine were unremarkable.  Chest x-ray showed bilateral pleural effusions, right greater than left.  There is increased interstitial markings.  The patient was started IV furosemide.  Assessment/Plan: Acute respiratory failure with hypoxia -Secondary to pulmonary edema pleural effusion -Presently stable on 3 L nasal cannula -Wean oxygen for saturation greater 92% -Personally reviewed chest x-ray--bilateral pleural effusions, right greater than left.  Increased interstitial markings  Acute on chronic diastolic CHF -continue IV furosemide 40 mg IV twice daily -The patient remains clinically  fluid overloaded -Accurate I's and Os -Daily weights -cardiology consult -Patient states that last weight at home was 150.4 pounds on 12/13/2019 -request R-side thoracocentesis for pleural effusion  Syncope -Echo--EF 70-75%, grade 2DD, severe TR, severe elevated PASP -remain on tele -orthostatics -EEG  Permanent atrial fibrillation -Rate controlled -Continue diltiazem CD -continue amiodarone -Hold metoprolol tartrate for now to allow blood pressure margin for diuresis -Continue apixaban  Cognitive impairment/dementia without behavioral disturbance -Continue Namenda and Seroquel -Continue sertraline  Hypokalemia -Replete -Check magnesium--2.3  Goals of care -palliative medicine consult -frequent hospitalizations -full code for now     Status is: Inpatient  The patient will require care spanning > 2 midnights and should be moved to inpatient because: IV treatments appropriate due to intensity of illness or inability to take PO  Remains fluid overloaded, requires IV lasix  Dispo: The patient is from: Home  Anticipated d/c is to: Home  Anticipated d/c date is: 2 days  Patient currently is not medically stable to d/c.        Family Communication:   No Family at bedside  Consultants:  none  Code Status:  FULL  DVT Prophylaxis: apixaban   Procedures: As Listed in Progress Note Above  Antibiotics: None    Subjective: Patient denies fevers, chills, headache, chest pain, dyspnea, nausea, vomiting, diarrhea, abdominal pain, dysuria, hematuria, hematochezia, and melena.   Objective: Vitals:   12/14/19 1430 12/14/19 2103 12/15/19 0100 12/15/19 0522  BP: 123/65 (!) 108/49 111/62 (!) 124/111  Pulse: 94 72 83 83  Resp: 17 20 18 18   Temp:  98.3 F (36.8 C) 98.4 F (36.9 C) 98.5 F (36.9 C)  TempSrc:  Oral Oral Oral  SpO2: 98% 99% 96% 91%    Intake/Output Summary (Last 24 hours) at 12/15/2019  1037 Last data filed at 12/15/2019 5465 Gross per 24 hour  Intake 134.5 ml  Output 400 ml  Net -265.5 ml   Weight change:  Exam:   General:  Pt is alert, follows commands appropriately, not in acute distress  HEENT: No icterus, No thrush, No neck mass, [place]/AT  Cardiovascular: IRRR, S1/S2, no rubs, no gallops  Respiratory: bibasilar rales.  No wheeze  Abdomen: Soft/+BS, non tender, non distended, no guarding  Extremities: 2+LE edema, No lymphangitis, No petechiae, No rashes, no synovitis   Data Reviewed: I have personally reviewed following labs and imaging studies Basic Metabolic Panel: Recent Labs  Lab 12/13/19 2152 12/14/19 0904 12/15/19 0549  NA 141 141 139  K 3.4* 3.6 3.3*  CL 93* 96* 94*  CO2 34* 32 31  GLUCOSE 129* 132* 116*  BUN 47* 43* 39*  CREATININE 2.22* 2.09* 1.89*  CALCIUM 9.4 8.9 9.1  MG  --   --  2.3   Liver Function Tests: Recent Labs  Lab 12/13/19 2152  AST 37  ALT 32  ALKPHOS 127*  BILITOT 1.1  PROT 7.1  ALBUMIN 3.9   No results for input(s): LIPASE, AMYLASE in the last 168 hours. No results for input(s): AMMONIA in the last 168 hours. Coagulation Profile: No results for input(s): INR, PROTIME in the last 168 hours. CBC: Recent Labs  Lab 12/13/19 2152  WBC 8.4  NEUTROABS 6.3  HGB 9.4*  HCT 32.5*  MCV 78.9*  PLT 304   Cardiac Enzymes: No results for input(s): CKTOTAL, CKMB, CKMBINDEX, TROPONINI in the last 168 hours. BNP: Invalid input(s): POCBNP CBG: No results for input(s): GLUCAP in the last 168 hours. HbA1C: No results for input(s): HGBA1C in the last 72 hours. Urine analysis:    Component Value Date/Time   COLORURINE YELLOW 09/10/2019 1248   APPEARANCEUR CLEAR 09/10/2019 1248   LABSPEC 1.018 09/10/2019 1248   PHURINE 6.0 09/10/2019 1248   GLUCOSEU NEGATIVE 09/10/2019 1248   HGBUR NEGATIVE 09/10/2019 1248   BILIRUBINUR NEGATIVE 09/10/2019 1248   [place] 09/10/2019 1248   PROTEINUR 100 (A) 09/10/2019  1248   NITRITE NEGATIVE 09/10/2019 1248   LEUKOCYTESUR NEGATIVE 09/10/2019 1248   Sepsis Labs: @LABRCNTIP (procalcitonin:4,lacticidven:4) ) Recent Results (from the past 240 hour(s))  SARS Coronavirus 2 by RT PCR (hospital order, performed in [place] hospital lab) Nasopharyngeal Nasopharyngeal Swab     Status: None   Collection Time: 12/13/19 10:01 PM   Specimen: Nasopharyngeal Swab  Result Value Ref Range Status   SARS Coronavirus 2 NEGATIVE NEGATIVE Final    Comment: (NOTE) SARS-CoV-2 target nucleic acids are NOT DETECTED. The SARS-CoV-2 RNA is generally detectable in upper and lower respiratory specimens during the acute phase of infection. The lowest concentration of SARS-CoV-2 viral copies this assay can detect is 250 copies / mL. A negative result does not preclude SARS-CoV-2 infection and should not be used as the sole basis for treatment or other patient management decisions.  A negative result may occur with improper specimen collection / handling, submission of specimen other than nasopharyngeal swab, presence of viral mutation(s) within the areas targeted by this assay, and inadequate number of viral copies (<250 copies / mL). A negative result must be combined with clinical observations, patient history, and epidemiological information. Fact Sheet for Patients:   StrictlyIdeas.no Fact Sheet for Healthcare Providers: BankingDealers.co.za This test is not yet  approved or cleared  by the [place] and has been authorized for detection and/or diagnosis of SARS-CoV-2 by FDA under an Emergency Use Authorization (EUA).  This EUA will remain in effect (meaning this test can be used) for the duration of the COVID-19 declaration under Section 564(b)(1) of the Act, 21 U.S.C. section 360bbb-3(b)(1), unless the authorization is terminated or revoked sooner. Performed at [facility], [address] [place],   [address]      Scheduled Meds: . amiodarone  200 mg Oral Daily  . apixaban  2.5 mg Oral BID  . diltiazem  120 mg Oral Daily  . furosemide  40 mg Intravenous BID  . QUEtiapine  25 mg Oral QHS  . sodium chloride flush  3 mL Intravenous Q12H   Continuous Infusions: . sodium chloride      Procedures/Studies: CT Head Wo Contrast  Result Date: 12/13/2019 CLINICAL DATA:  [place], back pain EXAM: CT HEAD WITHOUT CONTRAST CT CERVICAL SPINE WITHOUT CONTRAST TECHNIQUE: Multidetector CT imaging of the head and cervical spine was performed following the standard protocol without intravenous contrast. Multiplanar CT image reconstructions of the cervical spine were also generated. COMPARISON:  11/28/2018 FINDINGS: CT HEAD FINDINGS Brain: Chronic small vessel ischemic changes are seen within the periventricular white matter. No acute infarct or hemorrhage. Lateral ventricles and midline structures are unremarkable. No acute extra-axial fluid collections. No mass effect. Vascular: No hyperdense vessel or unexpected calcification. Skull: There is a small ex of little scalp hematoma. No underlying fracture. Remainder of the calvarium is unremarkable. Sinuses/Orbits: No acute finding. Other: None. CT CERVICAL SPINE FINDINGS Alignment: Alignment is grossly anatomic. Skull base and vertebrae: No acute displaced fractures. Soft tissues and spinal canal: No prevertebral fluid or swelling. No visible canal hematoma. Disc levels: Disc space narrowing and osteophyte formation is seen from C3-4 through C6-7, most pronounced at the C5-6 level. At C3-4 there is minimal symmetrical neural foraminal encroachment. At C4-5 there is mild central stenosis with bilateral right greater than left neural foraminal encroachment. At C5-6 there is at least moderate to severe central canal stenosis with significant right greater than left neural foraminal encroachment. At C6-7 there is mild central stenosis with severe right greater than left  neural foraminal encroachment. Upper chest: Airway is patent. Large right pleural effusion partially visualized. Left apex is clear. Other: Reconstructed images demonstrate no additional findings. IMPRESSION: 1. No acute intracranial process. 2. No acute cervical spine fracture. 3. Significant multilevel cervical spondylosis, most pronounced at C5-6. Significant but less severe changes are seen at C4-5 and C6-7 as above. 4. Large right pleural effusion. Electronically Signed   By: Randa Ngo M.D.   On: 12/13/2019 23:22   CT Cervical Spine Wo Contrast  Result Date: 12/13/2019 CLINICAL DATA:  [place], back pain EXAM: CT HEAD WITHOUT CONTRAST CT CERVICAL SPINE WITHOUT CONTRAST TECHNIQUE: Multidetector CT imaging of the head and cervical spine was performed following the standard protocol without intravenous contrast. Multiplanar CT image reconstructions of the cervical spine were also generated. COMPARISON:  11/28/2018 FINDINGS: CT HEAD FINDINGS Brain: Chronic small vessel ischemic changes are seen within the periventricular white matter. No acute infarct or hemorrhage. Lateral ventricles and midline structures are unremarkable. No acute extra-axial fluid collections. No mass effect. Vascular: No hyperdense vessel or unexpected calcification. Skull: There is a small ex of little scalp hematoma. No underlying fracture. Remainder of the calvarium is unremarkable. Sinuses/Orbits: No acute finding. Other: None. CT CERVICAL SPINE FINDINGS Alignment: Alignment is grossly anatomic. Skull  base and vertebrae: No acute displaced fractures. Soft tissues and spinal canal: No prevertebral fluid or swelling. No visible canal hematoma. Disc levels: Disc space narrowing and osteophyte formation is seen from C3-4 through C6-7, most pronounced at the C5-6 level. At C3-4 there is minimal symmetrical neural foraminal encroachment. At C4-5 there is mild central stenosis with bilateral right greater than left neural foraminal  encroachment. At C5-6 there is at least moderate to severe central canal stenosis with significant right greater than left neural foraminal encroachment. At C6-7 there is mild central stenosis with severe right greater than left neural foraminal encroachment. Upper chest: Airway is patent. Large right pleural effusion partially visualized. Left apex is clear. Other: Reconstructed images demonstrate no additional findings. IMPRESSION: 1. No acute intracranial process. 2. No acute cervical spine fracture. 3. Significant multilevel cervical spondylosis, most pronounced at C5-6. Significant but less severe changes are seen at C4-5 and C6-7 as above. 4. Large right pleural effusion. Electronically Signed   By: Randa Ngo M.D.   On: 12/13/2019 23:22   DG Pelvis Portable  Result Date: 12/13/2019 CLINICAL DATA:  [place], back pain EXAM: PORTABLE PELVIS 1-2 VIEWS COMPARISON:  None. FINDINGS: Single frontal view of the pelvis demonstrates postsurgical changes right hip. No acute displaced fracture. Left convex scoliosis lumbar spine. Sacroiliac joints are normal. Soft tissues are unremarkable. IMPRESSION: 1. No acute displaced fracture. Electronically Signed   By: Randa Ngo M.D.   On: 12/13/2019 22:46   DG Chest Portable 1 View  Result Date: 12/13/2019 CLINICAL DATA:  [place], pain EXAM: PORTABLE CHEST 1 VIEW COMPARISON:  10/13/2019 FINDINGS: Single frontal view of the chest demonstrates an enlarged right pleural effusion with right basilar atelectasis. Left basilar consolidation unchanged. No pneumothorax. Cardiac silhouette is stable. Atherosclerosis aortic arch. No acute displaced fractures. IMPRESSION: 1. Enlarging right pleural effusion. 2. Persistent bibasilar areas of consolidation likely atelectasis, increased on the right since prior study. Electronically Signed   By: Randa Ngo M.D.   On: 12/13/2019 22:43   ECHOCARDIOGRAM COMPLETE  Result Date: 12/14/2019    ECHOCARDIOGRAM REPORT   Patient Name:    ALEANE WESENBERG Date of Exam: 12/14/2019 Medical Rec #:  [id number]         Height:       64.5 in Accession #:    [id number]        Weight:       142.0 lb Date of Birth:  20-Jan-1932         BSA:          1.700 m Patient Age:    75 years          BP:           111/66 mmHg Patient Gender: F                 HR:           101 bpm. Exam Location:  [facility] Procedure: 2D Echo, Cardiac Doppler and Color Doppler Indications:    CHF  History:        Patient has prior history of Echocardiogram examinations, most                 recent 04/04/2019. CHF, Arrythmias:Atrial Fibrillation,                 Signs/Symptoms:Dyspnea; Risk Factors:Hypertension, Dyslipidemia                 and Dementia.  Sonographer:  Dustin Flock RDCS Referring Phys: [phone] Tianne Plott IMPRESSIONS  1. Left ventricular ejection fraction, by estimation, is 70 to 75%. The left ventricle has hyperdynamic function. The left ventricle has no regional wall motion abnormalities. Left ventricular diastolic parameters are consistent with Grade II diastolic dysfunction (pseudonormalization). Elevated left atrial pressure.  2. Right ventricular systolic function is normal. The right ventricular size is normal. There is severely elevated pulmonary artery systolic pressure. The estimated right ventricular systolic pressure is 50.2 mmHg.  3. Left atrial size was moderately dilated.  4. Right atrial size was mildly dilated.  5. The mitral valve is degenerative. Trivial mitral valve regurgitation. Moderate mitral stenosis. The mean mitral valve gradient is 8.0 mmHg.  6. Tricuspid valve regurgitation is severe.  7. The aortic valve is tricuspid. Aortic valve regurgitation is not visualized. Mild to moderate aortic valve sclerosis/calcification is present, without any evidence of aortic stenosis. Aortic valve mean gradient measures 7.0 mmHg. Aortic valve Vmax measures 1.76 m/s.  8. The inferior vena cava is normal in size with <50% respiratory variability, suggesting  right atrial pressure of 8 mmHg. Comparison(s): No significant change from prior study. Prior images reviewed side by side. FINDINGS  Left Ventricle: Left ventricular ejection fraction, by estimation, is 70 to 75%. The left ventricle has hyperdynamic function. The left ventricle has no regional wall motion abnormalities. The left ventricular internal cavity size was normal in size. There is no left ventricular hypertrophy. Left ventricular diastolic parameters are consistent with Grade II diastolic dysfunction (pseudonormalization). Elevated left atrial pressure. Right Ventricle: The right ventricular size is normal. No increase in right ventricular wall thickness. Right ventricular systolic function is normal. There is severely elevated pulmonary artery systolic pressure. The tricuspid regurgitant velocity is 4.21 m/s, and with an assumed right atrial pressure of 3 mmHg, the estimated right ventricular systolic pressure is 77.4 mmHg. Left Atrium: Left atrial size was moderately dilated. Right Atrium: Right atrial size was mildly dilated. Pericardium: There is no evidence of pericardial effusion. Mitral Valve: The mitral valve is degenerative in appearance. There is severe thickening of the mitral valve leaflet(s). There is severe calcification of the mitral valve leaflet(s). Normal mobility of the mitral valve leaflets. Severe mitral annular calcification. Trivial mitral valve regurgitation. Moderate mitral valve stenosis. MV peak gradient, 17.6 mmHg. The mean mitral valve gradient is 8.0 mmHg. Tricuspid Valve: The tricuspid valve is normal in structure. Tricuspid valve regurgitation is severe. No evidence of tricuspid stenosis. Aortic Valve: The aortic valve is tricuspid. Aortic valve regurgitation is not visualized. Mild to moderate aortic valve sclerosis/calcification is present, without any evidence of aortic stenosis. Aortic valve mean gradient measures 7.0 mmHg. Aortic valve peak gradient measures 12.4 mmHg.  Aortic valve area, by VTI measures 2.21 cm. Pulmonic Valve: The pulmonic valve was normal in structure. Pulmonic valve regurgitation is mild. No evidence of pulmonic stenosis. Aorta: The aortic root is normal in size and structure. Venous: The inferior vena cava is normal in size with less than 50% respiratory variability, suggesting right atrial pressure of 8 mmHg. IAS/Shunts: No atrial level shunt detected by color flow Doppler.  LEFT VENTRICLE PLAX 2D LVIDd:         3.29 cm  Diastology LVIDs:         2.34 cm  LV e' lateral:   7.10 cm/s LV PW:         1.06 cm  LV E/e' lateral: 25.5 LV IVS:        0.96 cm  LV e'  medial:    3.42 cm/s LVOT diam:     2.00 cm  LV E/e' medial:  52.9 LV SV:         74 LV SV Index:   44 LVOT Area:     3.14 cm  RIGHT VENTRICLE RV Basal diam:  3.47 cm RV S prime:     5.98 cm/s TAPSE (M-mode): 2.1 cm LEFT ATRIUM             Index       RIGHT ATRIUM           Index LA diam:        5.40 cm 3.18 cm/m  RA Area:     19.10 cm LA Vol (A2C):   77.1 ml 45.34 ml/m RA Volume:   53.00 ml  31.17 ml/m LA Vol (A4C):   79.3 ml 46.64 ml/m LA Biplane Vol: 82.6 ml 48.58 ml/m  AORTIC VALVE AV Area (Vmax):    1.77 cm AV Area (Vmean):   1.79 cm AV Area (VTI):     2.21 cm AV Vmax:           176.00 cm/s AV Vmean:          121.000 cm/s AV VTI:            0.336 m AV Peak Grad:      12.4 mmHg AV Mean Grad:      7.0 mmHg LVOT Vmax:         98.90 cm/s LVOT Vmean:        68.900 cm/s LVOT VTI:          0.236 m LVOT/AV VTI ratio: 0.70  AORTA Ao Root diam: 2.90 cm MITRAL VALVE                TRICUSPID VALVE MV Area (PHT): 3.42 cm     TR Peak grad:   70.9 mmHg MV Peak grad:  17.6 mmHg    TR Vmax:        421.00 cm/s MV Mean grad:  8.0 mmHg MV Vmax:       2.10 m/s     SHUNTS MV Vmean:      128.5 cm/s   Systemic VTI:  0.24 m MV Decel Time: 222 msec     Systemic Diam: 2.00 cm MV E velocity: 181.00 cm/s MV A velocity: 66.30 cm/s MV E/A ratio:  2.73 Candee Furbish MD Electronically signed by Candee Furbish MD Signature  Date/Time: 12/14/2019/12:34:52 PM    Final     Orson Eva, DO  [facility]  If 7PM-7AM, please contact night-coverage [email or web address] Password [email or web address] 12/15/2019, 10:37 AM   LOS: 1 day

## 2019-12-15 NOTE — Plan of Care (Signed)
  Problem: Acute Rehab PT Goals(only PT should resolve) Goal: Pt Will Go Supine/Side To Sit Outcome: Progressing Flowsheets (Taken 12/15/2019 1659) Pt will go Supine/Side to Sit: with min guard assist Goal: Pt Will Go Sit To Supine/Side Outcome: Progressing Flowsheets (Taken 12/15/2019 1659) Pt will go Sit to Supine/Side: with min guard assist Goal: Patient Will Transfer Sit To/From Stand Outcome: Progressing Flowsheets (Taken 12/15/2019 1659) Patient will transfer sit to/from stand: with min guard assist Goal: Pt Will Transfer Bed To Chair/Chair To Bed Outcome: Progressing Flowsheets (Taken 12/15/2019 1659) Pt will Transfer Bed to Chair/Chair to Bed: min guard assist Goal: Pt Will Ambulate Outcome: Progressing Flowsheets (Taken 12/15/2019 1659) Pt will Ambulate:  with min guard assist  50 feet  with least restrictive assistive device   Pamala Hurry D. Hartnett-Rands, MS, PT Per [place] [phone] 12/15/2019

## 2019-12-15 NOTE — Telephone Encounter (Addendum)
Patient's daughter Leda Min called - she wants to discuss colonoscopy patient was supposed to have, please call at [phone] - doesn't look like she had her labs you wanted from her OV -  she is currently in patient

## 2019-12-15 NOTE — Procedures (Signed)
PreOperative Dx: Recurrent right pleural effusion Postoperative Dx: Recurrent right pleural effusion Procedure:   US guided right thoracentesis Radiologist:  Thornton Papas Anesthesia:  10 ml of 1% lidocaine Specimen:  1 L of amber colored fluid EBL:   < 1 ml Complications: None

## 2019-12-15 NOTE — Progress Notes (Signed)
Thoracentesis complete no signs of distress.  

## 2019-12-15 NOTE — Evaluation (Signed)
Physical Therapy Evaluation Patient Details Name: Kristy Mckinney MRN: [id number] DOB: 10-19-1931 Today's Date: 12/15/2019   History of Present Illness  86 year old female with a history of permanent atrial fibrillation, diastolic CHF, GERD, anxiety, dementia, hypertension presenting with a syncopal episode.  The patient states that she was going across her kitchen to put a big potato in the microwave and started feeling dizzy.  The next thing she remembered was waking up on the floor with the potato scattered over the floor.  The patient has denied any recent chest pain, fevers, chills, palpitations, nausea, vomiting, diarrhea, abdominal pain, dysuria.  She recently followed up with her cardiology office on 12/04/2019.  Her torsemide was increased to 20 mg twice daily from 20 mg daily.  She endorses compliance.  However, the patient is not aware of any type of fluid restriction.  She has noted increasing lower extremity edema and just feeling "tired" with ambulation.    Clinical Impression  Pt admitted with above diagnosis. Patient agreeable to participating in PT evaluation today. Daughter present at beginning and end of session. Patient required min assist for bed mobility and min guard for all other mobility. Patient did require verbal cues for sequencing of steps and placement of hands during transfers and ambulation. Patient has a tendency to leave the RW behind in final approach to transfer surface and reach for external support rather than taking RW all the way to transfer surface. Patient in recliner at end of session. Pt currently with functional limitations due to the deficits listed below (see PT Problem List). Pt will benefit from skilled PT to increase their independence and safety with mobility to allow discharge to the venue listed below.       Follow Up Recommendations Home health PT;Supervision for mobility/OOB;Supervision - Intermittent    Equipment Recommendations  3in1 (PT)     Recommendations for Other Services       Precautions / Restrictions Precautions Precautions: Fall Restrictions Weight Bearing Restrictions: No      Mobility  Bed Mobility Overal bed mobility: Needs Assistance Bed Mobility: Supine to Sit     Supine to sit: Min assist;HOB elevated     General bed mobility comments: min A for trunk, use of bedrails  Transfers Overall transfer level: Needs assistance Equipment used: Rolling walker (2 wheeled) Transfers: Sit to/from [facility];Lateral/Scoot Transfers Sit to Stand: Min guard Stand pivot transfers: Min guard      Lateral/Scoot Transfers: Min guard General transfer comment: difficulty with power up for sit to stand transfer; cues for sequencing of steps and placement of hands  Ambulation/Gait Ambulation/Gait assistance: Min guard Gait Distance (Feet): 20 Feet Assistive device: Rolling walker (2 wheeled) Gait Pattern/deviations: Step-through pattern;Decreased step length - right;Decreased step length - left;Decreased stride length;Trunk flexed Gait velocity: decreased   General Gait Details: trunk flexed with sore abdominal; slow, labored gait; limited by fatigue, on room air; cues for sequencing of steps with RW until completely to transfer surface.  Stairs            Wheelchair Mobility    Modified Rankin (Stroke Patients Only)       Balance Overall balance assessment: Needs assistance Sitting-balance support: Bilateral upper extremity supported;Feet supported Sitting balance-Leahy Scale: Good     Standing balance support: Bilateral upper extremity supported;During functional activity Standing balance-Leahy Scale: Fair Standing balance comment: fair with RW  Pertinent Vitals/Pain Pain Assessment: 0-10 Pain Score: 6  Pain Location: stomach from fall Pain Descriptors / Indicators: Sore Pain Intervention(s): Limited activity within patient's  tolerance;Monitored during session    Home Living Family/patient expects to be discharged to:: Private residence Living Arrangements: Alone Available Help at Discharge: Family;Available PRN/intermittently Type of Home: Apartment Home Access: Level entry     Home Layout: One level Home Equipment: Tub bench;Cane - single point;Walker - 2 wheels;Hand held shower head;Grab bars - tub/shower;Grab bars - toilet      Prior Function Level of Independence: Needs assistance   Gait / Transfers Assistance Needed: ambulates household distances with RW; uses cart or scooter at grocery store  ADL's / Homemaking Assistance Needed: reports mostly independent with BADLs; assistance for IADLs        Hand Dominance   Dominant Hand: Right    Extremity/Trunk Assessment   Upper Extremity Assessment Upper Extremity Assessment: Generalized weakness    Lower Extremity Assessment Lower Extremity Assessment: Generalized weakness    Cervical / Trunk Assessment Cervical / Trunk Assessment: Kyphotic  Communication   Communication: No difficulties  Cognition Arousal/Alertness: Awake/alert Behavior During Therapy: WFL for tasks assessed/performed Overall Cognitive Status: Within Functional Limits for tasks assessed                                        General Comments      Exercises     Assessment/Plan    PT Assessment Patient needs continued PT services  PT Problem List Decreased strength;Decreased mobility;Pain;Decreased balance;Decreased knowledge of use of DME;Decreased activity tolerance       PT Treatment Interventions DME instruction;Therapeutic activities;Gait training;Therapeutic exercise;Patient/family education;Balance training    PT Goals (Current goals can be found in the Care Plan section)  Acute Rehab PT Goals Patient Stated Goal: Go home with HHPT. PT Goal Formulation: With patient/family Time For Goal Achievement: 12/29/19 Potential to Achieve  Goals: Good    Frequency Min 3X/week   Barriers to discharge        Co-evaluation               AM-PAC PT "6 Clicks" Mobility  Outcome Measure Help needed turning from your back to your side while in a flat bed without using bedrails?: A Little Help needed moving from lying on your back to sitting on the side of a flat bed without using bedrails?: A Little Help needed moving to and from a bed to a chair (including a wheelchair)?: A Little Help needed standing up from a chair using your arms (e.g., wheelchair or bedside chair)?: A Little Help needed to walk in hospital room?: A Little Help needed climbing 3-5 steps with a railing? : A Lot 6 Click Score: 17    End of Session Equipment Utilized During Treatment: Gait belt Activity Tolerance: Patient limited by fatigue Patient left: in chair;with call bell/phone within reach;with family/visitor present Nurse Communication: Mobility status PT Visit Diagnosis: Unsteadiness on feet (R26.81);Other abnormalities of gait and mobility (R26.89);History of falling (Z91.81);Muscle weakness (generalized) (M62.81);Pain    Time: 3419-3790 PT Time Calculation (min) (ACUTE ONLY): 30 min   Charges:   PT Evaluation $PT Eval Low Complexity: 1 Low PT Treatments $Gait Training: 8-22 mins        Floria Raveling. Hartnett-Rands, MS, PT Per [facility] #[id number] 12/15/2019, 4:55 PM

## 2019-12-15 NOTE — Progress Notes (Signed)
EEG complete - results pending 

## 2019-12-15 NOTE — Consult Note (Signed)
Consultation Note Date: 12/15/2019   Patient Name: Kristy Mckinney  DOB: 1932/03/07  MRN: [id number]  Age / Sex: 86 y.o., female  PCP: Celene Squibb, MD Referring Physician: Orson Eva, MD  Reason for Consultation: Establishing goals of care  HPI/Patient Profile: 86 y.o. female  with past medical history of atrial fibrillation, diastolic CHF, GERD, anxiety, dementia, hypertension admitted on 12/13/2019 with syncopal episode related to acute on chronic diastolic CHF.   Clinical Assessment and Goals of Care: I have reviewed electronic records including diagnostics, lab results, previous palliative care notes, MOST form, notes.   I met today with Ms. Posa and daughter/HCPOA, [name], at bedside. Ms. Holdsworth is very sleepy and complains of some vague discomfort/aches. Ms. Plasse is frustrated with her frequent hospitalizations and has expressed to her family multiple times that she is tired of this. She is a very independent woman and does not like having to rely on her family for assistance as much as she needs. We discussed natural disease trajectory and that this will unfortunately get worse with worsening heart and kidney disease. Discussed options moving with focus on her desire for continued hospitalization that may prolong her life )although this will only work for so long as well). However, we also discussed option to treat at home and if ineffective we would provide comfort at home to allow for a peaceful death at home.   Ms. Arlen shares with me that she is not ready to die although she is not afraid to die. She mainly wants to spend as much time with family as possible. She has strong faith and talks of going to heaven. I explained that these are difficult decisions and the right decision is the one that gives her the most peace and what is right today may not be right tomorrow or next week and that's okay. I  encouraged her to continue thinking, praying, and talking with her family about her wishes.   During this conversation we also discussed code status. I further explained that resuscitation efforts are only done when the body is physically dying and I worry with her weak heart that this would only cause her pain and suffering at the end of her life. I do not feel she would survive but if she did survive I would worry about her quality of life and level of suffering. After this discussion Ms. Ahmed Prima and Alexis agree this is not what they want and agree with DNR.   All questions/concerns addressed. Emotional support provided. I will follow up again tomorrow.   Primary Decision Maker PATIENT    SUMMARY OF RECOMMENDATIONS   - DNR decided - Consideration of hospice/comfort at home but patient may not be prepared for this stage quite yet  Code Status/Advance Care Planning:  DNR   Symptom Management:   Per attending  Palliative Prophylaxis:   Bowel Regimen, Delirium Protocol and Frequent Pain Assessment  Additional Recommendations (Limitations, Scope, Preferences):  Full Scope Treatment  Psycho-social/Spiritual:   Desire for further Chaplaincy support:no  Additional Recommendations: Education on Hospice  Prognosis:   Overall prognosis is poor with worsening heart and kidney disease. Would be eligible for hospice at home when goals align for more comfort focused care.   Discharge Planning: Home with Palliative Services      Primary Diagnoses: Present on Admission: . Atrial fibrillation with RVR ([place]) . Unspecified dementia without behavioral disturbance ([place]) . Hypomagnesemia . Atrial fibrillation ([place]) . Acute on chronic diastolic CHF (congestive heart failure) ([place]) . Acute respiratory failure with hypoxia ([place])   I have reviewed the medical record, interviewed the patient and family, and examined the patient. The following aspects are pertinent.  Past Medical  History:  Diagnosis Date  . Abdominal distension (gaseous)   . Acute on chronic diastolic heart failure ([place])   . Anxiety   . Atrial fibrillation ([place])   . Chronic back pain   . Dementia ([place])   . Disorder of left temporomandibular joint   . Dysphagia 05/03/2016  . Essential hypertension   . GERD (gastroesophageal reflux disease)   . History of bronchitis   . Iron deficiency anemia   . Irritable bowel syndrome with diarrhea   . Osteoporosis   . Pneumonia    Social History   Socioeconomic History  . Marital status: Widowed    Spouse name: Not on file  . Number of children: Not on file  . Years of education: Not on file  . Highest education level: Not on file  Occupational History  . Occupation: retired  Tobacco Use  . Smoking status: Never Smoker  . Smokeless tobacco: Never Used  Substance and Sexual Activity  . Alcohol use: No  . Drug use: No  . Sexual activity: Yes    Birth control/protection: Surgical  Other Topics Concern  . Not on file  Social History Narrative   Pt lives in an apartment complex, reports that she still drives   Social Determinants of Health   Financial Resource Strain: Low Risk   . Difficulty of Paying Living Expenses: Not hard at all  Food Insecurity: No Food Insecurity  . Worried About Charity fundraiser in the Last Year: Never true  . Ran Out of Food in the Last Year: Never true  Transportation Needs: No Transportation Needs  . Lack of Transportation (Medical): No  . Lack of Transportation (Non-Medical): No  Physical Activity: Sufficiently Active  . Days of Exercise per Week: 5 days  . Minutes of Exercise per Session: 30 min  Stress: No Stress Concern Present  . Feeling of Stress : Not at all  Social Connections: Slightly Isolated  . Frequency of Communication with Friends and Family: More than three times a week  . Frequency of Social Gatherings with Friends and Family: More than three times a week  . Attends Religious Services:  More than 4 times per year  . Active Member of Clubs or Organizations: Yes  . Attends Archivist Meetings: More than 4 times per year  . Marital Status: Widowed   Family History  Problem Relation Age of Onset  . Hypertension Child    Scheduled Meds: . amiodarone  200 mg Oral Daily  . apixaban  2.5 mg Oral BID  . diltiazem  120 mg Oral Daily  . furosemide  40 mg Intravenous BID  . QUEtiapine  25 mg Oral QHS  . sodium chloride flush  3 mL Intravenous Q12H   Continuous Infusions: . sodium chloride     PRN Meds:.sodium chloride, acetaminophen, ondansetron (  ZOFRAN) IV, sodium chloride flush Allergies  Allergen Reactions  . Penicillins Anaphylaxis    Did it involve swelling of the face/tongue/throat, SOB, or low BP? Unknown Did it involve sudden or severe rash/hives, skin peeling, or any reaction on the inside of your mouth or nose? Unknown Did you need to seek medical attention at a hospital or doctor's office? Unknown When did it last happen? If all above answers are "NO", may proceed with cephalosporin use.   . Biaxin [Clarithromycin]   . Ciprofloxacin Nausea And Vomiting  . Emetrol Nausea Only    swelling  . Feldene [Piroxicam] Nausea And Vomiting  . Sulfa Antibiotics     swelling   Review of Systems  Constitutional: Positive for activity change and fatigue.  Respiratory: Negative for shortness of breath.   Psychiatric/Behavioral: Positive for sleep disturbance.    Physical Exam Vitals reviewed.  Constitutional:      General: She is not in acute distress.    Appearance: She is ill-appearing.  Cardiovascular:     Rate and Rhythm: Normal rate.  Pulmonary:     Effort: Pulmonary effort is normal. No tachypnea, accessory muscle usage or respiratory distress.  Abdominal:     Palpations: Abdomen is soft.  Neurological:     Mental Status: She is alert and oriented to person, place, and time.     Vital Signs: BP (!) 124/111 (BP Location: Left Arm)    Pulse 83   Temp 98.5 F (36.9 C) (Oral)   Resp 18   SpO2 91%  Pain Scale: 0-10 POSS *See Group Information*: 1-Acceptable,Awake and alert Pain Score: 0-No pain   SpO2: SpO2: 91 % O2 Device:SpO2: 91 % O2 Flow Rate: .O2 Flow Rate (L/min): 3 L/min  IO: Intake/output summary:   Intake/Output Summary (Last 24 hours) at 12/15/2019 0831 Last data filed at 12/15/2019 4801 Gross per 24 hour  Intake 134.5 ml  Output 400 ml  Net -265.5 ml    LBM: Last BM Date: 12/13/19 Baseline Weight:   Most recent weight:       Palliative Assessment/Data:     Time In: 1500 Time Out: 1610 Time Total: 81mn Greater than 50%  of this time was spent counseling and coordinating care related to the above assessment and plan.  Signed by: AVinie Sill NP Palliative Medicine Team Pager # [phone](M-F 8a-5p) Team Phone # [phone](Nights/Weekends)

## 2019-12-16 DIAGNOSIS — Z9889 Other specified postprocedural states: Secondary | ICD-10-CM

## 2019-12-16 LAB — BASIC METABOLIC PANEL
Anion gap: 11 (ref 5–15)
BUN: 31 mg/dL — ABNORMAL HIGH (ref 8–23)
CO2: 35 mmol/L — ABNORMAL HIGH (ref 22–32)
Calcium: 8.5 mg/dL — ABNORMAL LOW (ref 8.9–10.3)
Chloride: 91 mmol/L — ABNORMAL LOW (ref 98–111)
Creatinine, Ser: 1.42 mg/dL — ABNORMAL HIGH (ref 0.44–1.00)
GFR calc Af Amer: 39 mL/min — ABNORMAL LOW (ref >60)
GFR calc non Af Amer: 34 mL/min — ABNORMAL LOW (ref >60)
Glucose, Bld: 121 mg/dL — ABNORMAL HIGH (ref 70–99)
Potassium: 3 mmol/L — ABNORMAL LOW (ref 3.5–5.1)
Sodium: 137 mmol/L (ref 135–145)

## 2019-12-16 LAB — CYTOLOGY - NON PAP

## 2019-12-16 MED ORDER — POTASSIUM CHLORIDE CRYS ER 20 MEQ PO TBCR
40.0000 meq | EXTENDED_RELEASE_TABLET | Freq: Every day | ORAL | Status: DC
  Administered 2019-12-16 – 2019-12-23 (×8): 40 meq via ORAL
  Filled 2019-12-16: qty 2
  Filled 2019-12-16: qty 4
  Filled 2019-12-16 (×6): qty 2

## 2019-12-16 NOTE — Progress Notes (Addendum)
PROGRESS NOTE  Kristy Mckinney JIR:[id number] DOB: [birth year] DOA: 12/13/2019 PCP: Celene Squibb, MD   Brief History: 86 year old female with a history of permanent atrial fibrillation, diastolic CHF, GERD, anxiety, dementia, hypertension presenting with a syncopal episode. The patient states that she was going across her kitchen to put a big potato in the microwave and started feeling dizzy. The next thing she remembered was waking up on the floor with the potato scattered over the floor. The patient has denied any recent chest pain, fevers, chills, palpitations, nausea, vomiting, diarrhea, abdominal pain, dysuria. She recently followed up with her cardiology office on 12/04/2019. Her torsemide was increased to 20 mg twice daily from 20 mg daily. She endorses compliance. However, the patient is not aware of any type of fluid restriction. She has noted increasing lower extremity edema and just feeling "tired" with ambulation. In the emergency department, the patient was noted to have oxygen saturation of 78% on room air. She was afebrile hemodynamically stable. She was placed on supplemental oxygen with oxygen saturation up to 97%. BMP showed a potassium 3.4 with unremarkable LFTs. WBC 8.4, hemoglobin 9.4, platelets 304,000. BNP was 902. Troponins were unremarkable. CT of the brain and C-spine were unremarkable. Chest x-ray showed bilateral pleural effusions, right greater than left. There is increased interstitial markings. The patient was started IV furosemide.  Assessment/Plan: Acute respiratory failure with hypoxia -Secondary to pulmonary edema pleural effusion -Presently stable on 3 L nasal cannula -Wean oxygen for saturation greater 92% -Personally reviewed chest x-ray--bilateral pleural effusions, right greater than left. Increased interstitial markings  Acute on chronic diastolic CHF -continue IV furosemide 40 mg IV twice daily -The patient remains clinically  fluid overloaded -pt endorses some indiscretion with fluid intake at home -Accurate I's andOs--incomplete -Daily weights -cardiology consult -Patient states that last weight at home was 150.4 pounds on 12/13/2019 -6/7 R-side thoracocentesis--1L removed  Syncope -Echo--EF 70-75%, grade 2DD, severe TR, severe elevated PASP -remain on tele -orthostatics -EEG--mild diffuse encephalopathy  Permanent atrial fibrillation -Rate controlled -Continue diltiazem CD -continue amiodarone -Hold metoprolol tartrate for now to allow blood pressure margin for diuresis -Continue apixaban  Cognitive impairment/dementia without behavioral disturbance -Continue Namenda and Seroquel -Continue sertraline  Hypokalemia -Replete -Check magnesium--2.3  Goals of care -palliative medicine consult -frequent hospitalizations -changed to DNR     Status is: Inpatient  The patient will require care spanning > 2 midnights and should be moved to inpatient because:IV treatments appropriate due to intensity of illness or inability to take PO Remains fluid overloaded, requires IV lasix  Dispo: The patient is from:Home Anticipated d/c is BP:[facility] Anticipated d/c date is: 1-2 days Patient currentlyis not medically stable to d/c.        Family Communication:NoFamily at bedside  Consultants:none  Code Status: FULL  DVT Prophylaxis:apixaban   Procedures: As Listed in Progress Note Above  Antibiotics: None    Subjective: Pt still complains of sob.  Denies some dyspnea on exertion.  Denies f/c, n/v/d, abd pain, cp.  Objective: Vitals:   12/15/19 1438 12/15/19 2034 12/16/19 0456 12/16/19 1338  BP: 107/62 (!) 95/49 (!) 120/54 (!) 119/43  Pulse: 79 80 87 92  Resp: 18 20 20 18   Temp: 99.1 F (37.3 C) 98.3 F (36.8 C) 98.1 F (36.7 C) 97.9 F (36.6 C)  TempSrc: Oral Oral Oral Oral  SpO2: 92% 94% 93% 96%    Weight:   61.3 kg     Intake/Output  Summary (Last 24 hours) at 12/16/2019 1902 Last data filed at 12/16/2019 0800 Gross per 24 hour  Intake 240 ml  Output 300 ml  Net -60 ml   Weight change:  Exam:   General:  Pt is alert, follows commands appropriately, not in acute distress  HEENT: No icterus, No thrush, No neck mass, [place]/AT  Cardiovascular: RRR, S1/S2, no rubs, no gallops  Respiratory: bibasilar crackles.no wheeze  Abdomen: Soft/+BS, non tender, non distended, no guarding  Extremities: 1+LE edema, No lymphangitis, No petechiae, No rashes, no synovitis   Data Reviewed: I have personally reviewed following labs and imaging studies Basic Metabolic Panel: Recent Labs  Lab 12/13/19 2152 12/14/19 0904 12/15/19 0549 12/16/19 0549  NA 141 141 139 137  K 3.4* 3.6 3.3* 3.0*  CL 93* 96* 94* 91*  CO2 34* 32 31 35*  GLUCOSE 129* 132* 116* 121*  BUN 47* 43* 39* 31*  CREATININE 2.22* 2.09* 1.89* 1.42*  CALCIUM 9.4 8.9 9.1 8.5*  MG  --   --  2.3  --    Liver Function Tests: Recent Labs  Lab 12/13/19 2152  AST 37  ALT 32  ALKPHOS 127*  BILITOT 1.1  PROT 7.1  ALBUMIN 3.9   No results for input(s): LIPASE, AMYLASE in the last 168 hours. No results for input(s): AMMONIA in the last 168 hours. Coagulation Profile: No results for input(s): INR, PROTIME in the last 168 hours. CBC: Recent Labs  Lab 12/13/19 2152  WBC 8.4  NEUTROABS 6.3  HGB 9.4*  HCT 32.5*  MCV 78.9*  PLT 304   Cardiac Enzymes: No results for input(s): CKTOTAL, CKMB, CKMBINDEX, TROPONINI in the last 168 hours. BNP: Invalid input(s): POCBNP CBG: No results for input(s): GLUCAP in the last 168 hours. HbA1C: No results for input(s): HGBA1C in the last 72 hours. Urine analysis:    Component Value Date/Time   COLORURINE YELLOW 09/10/2019 1248   APPEARANCEUR CLEAR 09/10/2019 1248   LABSPEC 1.018 09/10/2019 1248   PHURINE 6.0 09/10/2019 1248   GLUCOSEU NEGATIVE 09/10/2019 1248   HGBUR NEGATIVE  09/10/2019 1248   BILIRUBINUR NEGATIVE 09/10/2019 1248   [place] 09/10/2019 1248   PROTEINUR 100 (A) 09/10/2019 1248   NITRITE NEGATIVE 09/10/2019 1248   LEUKOCYTESUR NEGATIVE 09/10/2019 1248   Sepsis Labs: @LABRCNTIP (procalcitonin:4,lacticidven:4) ) Recent Results (from the past 240 hour(s))  SARS Coronavirus 2 by RT PCR (hospital order, performed in [facility] hospital lab) Nasopharyngeal Nasopharyngeal Swab     Status: None   Collection Time: 12/13/19 10:01 PM   Specimen: Nasopharyngeal Swab  Result Value Ref Range Status   SARS Coronavirus 2 NEGATIVE NEGATIVE Final    Comment: (NOTE) SARS-CoV-2 target nucleic acids are NOT DETECTED. The SARS-CoV-2 RNA is generally detectable in upper and lower respiratory specimens during the acute phase of infection. The lowest concentration of SARS-CoV-2 viral copies this assay can detect is 250 copies / mL. A negative result does not preclude SARS-CoV-2 infection and should not be used as the sole basis for treatment or other patient management decisions.  A negative result may occur with improper specimen collection / handling, submission of specimen other than nasopharyngeal swab, presence of viral mutation(s) within the areas targeted by this assay, and inadequate number of viral copies (<250 copies / mL). A negative result must be combined with clinical observations, patient history, and epidemiological information. Fact Sheet for Patients:   StrictlyIdeas.no Fact Sheet for Healthcare Providers: BankingDealers.co.za This test is not yet approved or cleared  by  the [employer] and has been authorized for detection and/or diagnosis of SARS-CoV-2 by FDA under an Emergency Use Authorization (EUA).  This EUA will remain in effect (meaning this test can be used) for the duration of the COVID-19 declaration under Section 564(b)(1) of the Act, 21 U.S.C. section 360bbb-3(b)(1),  unless the authorization is terminated or revoked sooner. Performed at [facility], [address], [place], [place] [address]   Culture, body fluid-bottle     Status: None (Preliminary result)   Collection Time: 12/15/19  1:05 PM   Specimen: Fluid  Result Value Ref Range Status   Specimen Description FLUID PLEURAL COLLECTED BY DOCTOR  Final   Special Requests BOTTLES DRAWN AEROBIC AND ANAEROBIC 10CC EACH  Final   Culture   Final    NO GROWTH < 24 HOURS Performed at [facility], [address] [place], [place] [address]    Report Status PENDING  Incomplete  Gram stain     Status: None   Collection Time: 12/15/19  1:05 PM   Specimen: Pleura  Result Value Ref Range Status   Specimen Description PLEURAL  Final   Special Requests PLEURAL  Final   Gram Stain   Final    CYTOSPIN SMEAR WBC PRESENT, PREDOMINANTLY MONONUCLEAR NO ORGANISMS SEEN Performed at [facility] Performed at [facility], [address] [place], [place] [address]    Report Status 12/15/2019 FINAL  Final     Scheduled Meds: . amiodarone  200 mg Oral Daily  . apixaban  2.5 mg Oral BID  . diltiazem  120 mg Oral Daily  . furosemide  40 mg Intravenous BID  . memantine  5 mg Oral BID  . potassium chloride  40 mEq Oral Daily  . QUEtiapine  25 mg Oral QHS  . sertraline  50 mg Oral QHS  . sodium chloride flush  3 mL Intravenous Q12H   Continuous Infusions: . sodium chloride      Procedures/Studies: DG Chest 1 View  Result Date: 12/15/2019 CLINICAL DATA:  Post RIGHT thoracentesis EXAM: CHEST  1 VIEW COMPARISON:  12/13/2019 FINDINGS: Enlargement of cardiac silhouette with mitral annular calcification. Atherosclerotic calcification aorta. Pulmonary vascular congestion. Small bibasilar pleural effusions and atelectasis decreased on RIGHT since prior exam post thoracentesis. Mild perihilar edema. No pneumothorax. Bones demineralized. IMPRESSION: No pneumothorax following RIGHT thoracentesis. Enlargement of  cardiac silhouette with pulmonary vascular congestion and mild pulmonary edema. Bibasilar effusions and atelectasis decreased on RIGHT since prior study Electronically Signed   By: Lavonia Dana M.D.   On: 12/15/2019 13:32   CT Head Wo Contrast  Result Date: 12/13/2019 CLINICAL DATA:  [place], back pain EXAM: CT HEAD WITHOUT CONTRAST CT CERVICAL SPINE WITHOUT CONTRAST TECHNIQUE: Multidetector CT imaging of the head and cervical spine was performed following the standard protocol without intravenous contrast. Multiplanar CT image reconstructions of the cervical spine were also generated. COMPARISON:  11/28/2018 FINDINGS: CT HEAD FINDINGS Brain: Chronic small vessel ischemic changes are seen within the periventricular white matter. No acute infarct or hemorrhage. Lateral ventricles and midline structures are unremarkable. No acute extra-axial fluid collections. No mass effect. Vascular: No hyperdense vessel or unexpected calcification. Skull: There is a small ex of little scalp hematoma. No underlying fracture. Remainder of the calvarium is unremarkable. Sinuses/Orbits: No acute finding. Other: None. CT CERVICAL SPINE FINDINGS Alignment: Alignment is grossly anatomic. Skull base and vertebrae: No acute displaced fractures. Soft tissues and spinal canal: No prevertebral fluid or swelling. No visible canal hematoma. Disc levels:  Disc space narrowing and osteophyte formation is seen from C3-4 through C6-7, most pronounced at the C5-6 level. At C3-4 there is minimal symmetrical neural foraminal encroachment. At C4-5 there is mild central stenosis with bilateral right greater than left neural foraminal encroachment. At C5-6 there is at least moderate to severe central canal stenosis with significant right greater than left neural foraminal encroachment. At C6-7 there is mild central stenosis with severe right greater than left neural foraminal encroachment. Upper chest: Airway is patent. Large right pleural effusion  partially visualized. Left apex is clear. Other: Reconstructed images demonstrate no additional findings. IMPRESSION: 1. No acute intracranial process. 2. No acute cervical spine fracture. 3. Significant multilevel cervical spondylosis, most pronounced at C5-6. Significant but less severe changes are seen at C4-5 and C6-7 as above. 4. Large right pleural effusion. Electronically Signed   By: Randa Ngo M.D.   On: 12/13/2019 23:22   CT Cervical Spine Wo Contrast  Result Date: 12/13/2019 CLINICAL DATA:  [place], back pain EXAM: CT HEAD WITHOUT CONTRAST CT CERVICAL SPINE WITHOUT CONTRAST TECHNIQUE: Multidetector CT imaging of the head and cervical spine was performed following the standard protocol without intravenous contrast. Multiplanar CT image reconstructions of the cervical spine were also generated. COMPARISON:  11/28/2018 FINDINGS: CT HEAD FINDINGS Brain: Chronic small vessel ischemic changes are seen within the periventricular white matter. No acute infarct or hemorrhage. Lateral ventricles and midline structures are unremarkable. No acute extra-axial fluid collections. No mass effect. Vascular: No hyperdense vessel or unexpected calcification. Skull: There is a small ex of little scalp hematoma. No underlying fracture. Remainder of the calvarium is unremarkable. Sinuses/Orbits: No acute finding. Other: None. CT CERVICAL SPINE FINDINGS Alignment: Alignment is grossly anatomic. Skull base and vertebrae: No acute displaced fractures. Soft tissues and spinal canal: No prevertebral fluid or swelling. No visible canal hematoma. Disc levels: Disc space narrowing and osteophyte formation is seen from C3-4 through C6-7, most pronounced at the C5-6 level. At C3-4 there is minimal symmetrical neural foraminal encroachment. At C4-5 there is mild central stenosis with bilateral right greater than left neural foraminal encroachment. At C5-6 there is at least moderate to severe central canal stenosis with significant  right greater than left neural foraminal encroachment. At C6-7 there is mild central stenosis with severe right greater than left neural foraminal encroachment. Upper chest: Airway is patent. Large right pleural effusion partially visualized. Left apex is clear. Other: Reconstructed images demonstrate no additional findings. IMPRESSION: 1. No acute intracranial process. 2. No acute cervical spine fracture. 3. Significant multilevel cervical spondylosis, most pronounced at C5-6. Significant but less severe changes are seen at C4-5 and C6-7 as above. 4. Large right pleural effusion. Electronically Signed   By: Randa Ngo M.D.   On: 12/13/2019 23:22   DG Pelvis Portable  Result Date: 12/13/2019 CLINICAL DATA:  [place], back pain EXAM: PORTABLE PELVIS 1-2 VIEWS COMPARISON:  None. FINDINGS: Single frontal view of the pelvis demonstrates postsurgical changes right hip. No acute displaced fracture. Left convex scoliosis lumbar spine. Sacroiliac joints are normal. Soft tissues are unremarkable. IMPRESSION: 1. No acute displaced fracture. Electronically Signed   By: Randa Ngo M.D.   On: 12/13/2019 22:46   DG Chest Portable 1 View  Result Date: 12/13/2019 CLINICAL DATA:  [place], pain EXAM: PORTABLE CHEST 1 VIEW COMPARISON:  10/13/2019 FINDINGS: Single frontal view of the chest demonstrates an enlarged right pleural effusion with right basilar atelectasis. Left basilar consolidation unchanged. No pneumothorax. Cardiac silhouette is stable. Atherosclerosis aortic arch.  No acute displaced fractures. IMPRESSION: 1. Enlarging right pleural effusion. 2. Persistent bibasilar areas of consolidation likely atelectasis, increased on the right since prior study. Electronically Signed   By: Randa Ngo M.D.   On: 12/13/2019 22:43   EEG adult  Result Date: 12/15/2019 Lora Havens, MD     12/15/2019  2:20 PM Patient Name: GLINDA NATZKE MRN: [id number] Epilepsy Attending: Lora Havens Referring Physician/Provider:  Dr. Shanon Brow Alvino Lechuga Date: 12/15/2019 Duration: 23.39 minutes Patient history: 30 old female with syncope.  EEG evaluate for seizures. Level of alertness: Awake AEDs during EEG study: None Technical aspects: This EEG study was done with scalp electrodes positioned according to the 10-20 International system of electrode placement. Electrical activity was acquired at a sampling rate of 500Hz  and reviewed with a high frequency filter of 70Hz  and a low frequency filter of 1Hz . EEG data were recorded continuously and digitally stored. Description: The posterior dominant rhythm consists of 8 Hz activity of moderate voltage (25-35 uV) seen predominantly in posterior head regions, symmetric and reactive to eye opening and eye closing. EEG showed intermittent generalized 3 to 6 Hz theta-delta slowing. Hyperventilation and photic stimulation were not performed.   ABNORMALITY -Intermittent slow, generalized IMPRESSION: This study is suggestive of mild diffuse encephalopathy, nonspecific etiology. No seizures or epileptiform discharges were seen throughout the recording. Lora Havens   ECHOCARDIOGRAM COMPLETE  Result Date: 12/14/2019    ECHOCARDIOGRAM REPORT   Patient Name:   MCKYNLEE LUSE Date of Exam: 12/14/2019 Medical Rec #:  [id number]         Height:       64.5 in Accession #:    [id number]        Weight:       142.0 lb Date of Birth:  19-Dec-1931         BSA:          1.700 m Patient Age:    26 years          BP:           111/66 mmHg Patient Gender: F                 HR:           101 bpm. Exam Location:  [facility] Procedure: 2D Echo, Cardiac Doppler and Color Doppler Indications:    CHF  History:        Patient has prior history of Echocardiogram examinations, most                 recent 04/04/2019. CHF, Arrythmias:Atrial Fibrillation,                 Signs/Symptoms:Dyspnea; Risk Factors:Hypertension, Dyslipidemia                 and Dementia.  Sonographer:    Dustin Flock RDCS Referring Phys: [id number] Armenta Erskin  IMPRESSIONS  1. Left ventricular ejection fraction, by estimation, is 70 to 75%. The left ventricle has hyperdynamic function. The left ventricle has no regional wall motion abnormalities. Left ventricular diastolic parameters are consistent with Grade II diastolic dysfunction (pseudonormalization). Elevated left atrial pressure.  2. Right ventricular systolic function is normal. The right ventricular size is normal. There is severely elevated pulmonary artery systolic pressure. The estimated right ventricular systolic pressure is 67.5 mmHg.  3. Left atrial size was moderately dilated.  4. Right atrial size was mildly dilated.  5. The mitral valve is degenerative. Trivial mitral valve regurgitation.  Moderate mitral stenosis. The mean mitral valve gradient is 8.0 mmHg.  6. Tricuspid valve regurgitation is severe.  7. The aortic valve is tricuspid. Aortic valve regurgitation is not visualized. Mild to moderate aortic valve sclerosis/calcification is present, without any evidence of aortic stenosis. Aortic valve mean gradient measures 7.0 mmHg. Aortic valve Vmax measures 1.76 m/s.  8. The inferior vena cava is normal in size with <50% respiratory variability, suggesting right atrial pressure of 8 mmHg. Comparison(s): No significant change from prior study. Prior images reviewed side by side. FINDINGS  Left Ventricle: Left ventricular ejection fraction, by estimation, is 70 to 75%. The left ventricle has hyperdynamic function. The left ventricle has no regional wall motion abnormalities. The left ventricular internal cavity size was normal in size. There is no left ventricular hypertrophy. Left ventricular diastolic parameters are consistent with Grade II diastolic dysfunction (pseudonormalization). Elevated left atrial pressure. Right Ventricle: The right ventricular size is normal. No increase in right ventricular wall thickness. Right ventricular systolic function is normal. There is severely elevated pulmonary  artery systolic pressure. The tricuspid regurgitant velocity is 4.21 m/s, and with an assumed right atrial pressure of 3 mmHg, the estimated right ventricular systolic pressure is 67.5 mmHg. Left Atrium: Left atrial size was moderately dilated. Right Atrium: Right atrial size was mildly dilated. Pericardium: There is no evidence of pericardial effusion. Mitral Valve: The mitral valve is degenerative in appearance. There is severe thickening of the mitral valve leaflet(s). There is severe calcification of the mitral valve leaflet(s). Normal mobility of the mitral valve leaflets. Severe mitral annular calcification. Trivial mitral valve regurgitation. Moderate mitral valve stenosis. MV peak gradient, 17.6 mmHg. The mean mitral valve gradient is 8.0 mmHg. Tricuspid Valve: The tricuspid valve is normal in structure. Tricuspid valve regurgitation is severe. No evidence of tricuspid stenosis. Aortic Valve: The aortic valve is tricuspid. Aortic valve regurgitation is not visualized. Mild to moderate aortic valve sclerosis/calcification is present, without any evidence of aortic stenosis. Aortic valve mean gradient measures 7.0 mmHg. Aortic valve peak gradient measures 12.4 mmHg. Aortic valve area, by VTI measures 2.21 cm. Pulmonic Valve: The pulmonic valve was normal in structure. Pulmonic valve regurgitation is mild. No evidence of pulmonic stenosis. Aorta: The aortic root is normal in size and structure. Venous: The inferior vena cava is normal in size with less than 50% respiratory variability, suggesting right atrial pressure of 8 mmHg. IAS/Shunts: No atrial level shunt detected by color flow Doppler.  LEFT VENTRICLE PLAX 2D LVIDd:         3.29 cm  Diastology LVIDs:         2.34 cm  LV e' lateral:   7.10 cm/s LV PW:         1.06 cm  LV E/e' lateral: 25.5 LV IVS:        0.96 cm  LV e' medial:    3.42 cm/s LVOT diam:     2.00 cm  LV E/e' medial:  52.9 LV SV:         74 LV SV Index:   44 LVOT Area:     3.14 cm  RIGHT  VENTRICLE RV Basal diam:  3.47 cm RV S prime:     5.98 cm/s TAPSE (M-mode): 2.1 cm LEFT ATRIUM             Index       RIGHT ATRIUM           Index LA diam:  5.40 cm 3.18 cm/m  RA Area:     19.10 cm LA Vol (A2C):   77.1 ml 45.34 ml/m RA Volume:   53.00 ml  31.17 ml/m LA Vol (A4C):   79.3 ml 46.64 ml/m LA Biplane Vol: 82.6 ml 48.58 ml/m  AORTIC VALVE AV Area (Vmax):    1.77 cm AV Area (Vmean):   1.79 cm AV Area (VTI):     2.21 cm AV Vmax:           176.00 cm/s AV Vmean:          121.000 cm/s AV VTI:            0.336 m AV Peak Grad:      12.4 mmHg AV Mean Grad:      7.0 mmHg LVOT Vmax:         98.90 cm/s LVOT Vmean:        68.900 cm/s LVOT VTI:          0.236 m LVOT/AV VTI ratio: 0.70  AORTA Ao Root diam: 2.90 cm MITRAL VALVE                TRICUSPID VALVE MV Area (PHT): 3.42 cm     TR Peak grad:   70.9 mmHg MV Peak grad:  17.6 mmHg    TR Vmax:        421.00 cm/s MV Mean grad:  8.0 mmHg MV Vmax:       2.10 m/s     SHUNTS MV Vmean:      128.5 cm/s   Systemic VTI:  0.24 m MV Decel Time: 222 msec     Systemic Diam: 2.00 cm MV E velocity: 181.00 cm/s MV A velocity: 66.30 cm/s MV E/A ratio:  2.73 Candee Furbish MD Electronically signed by Candee Furbish MD Signature Date/Time: 12/14/2019/12:34:52 PM    Final    US THORACENTESIS ASP PLEURAL SPACE W/IMG GUIDE  Result Date: 12/15/2019 INDICATION: Recurrent RIGHT pleural effusion EXAM: ULTRASOUND GUIDED DIAGNOSTIC AND THERAPEUTIC RIGHT THORACENTESIS MEDICATIONS: None. COMPLICATIONS: None immediate. PROCEDURE: An ultrasound guided thoracentesis was thoroughly discussed with the patient and questions answered. The benefits, risks, alternatives and complications were also discussed. The patient understands and wishes to proceed with the procedure. Written consent was obtained. Ultrasound was performed to localize and mark an adequate pocket of fluid in the RIGHT chest. The area was then prepped and draped in the normal sterile fashion. 1% Lidocaine was used for local  anesthesia. Under ultrasound guidance a 8 French thoracentesis catheter was introduced. Thoracentesis was performed. The catheter was removed and a dressing applied. FINDINGS: A total of approximately 1 L of amber colored RIGHT pleural fluid was removed. Samples were sent to the laboratory as requested by the clinical team. IMPRESSION: Successful ultrasound guided RIGHT thoracentesis yielding 1 L of pleural fluid. Electronically Signed   By: Lavonia Dana M.D.   On: 12/15/2019 13:44    Orson Eva, DO  [facility]  If 7PM-7AM, please contact night-coverage [email or web address] Password [email or web address] 12/16/2019, 7:02 PM   LOS: 2 days

## 2019-12-16 NOTE — Progress Notes (Signed)
   12/16/19 0846  Assess: MEWS Score  ECG Heart Rate 99  Assess: if the MEWS score is Yellow or Red  Were vital signs taken at a resting state? No (No vitals were taken)  Focused Assessment Documented focused assessment  Early Detection of Sepsis Score *See Row Information* Low  MEWS guidelines implemented *See Row Information* No, other (Comment) (Patient has Afib)  Treat  MEWS Interventions Other (Comment) (Rechecked HR)  Document  Patient Outcome Other (Comment) (Patient is resting after medications)  Progress note created (see row info) Yes     12/16/19 0846  Assess: MEWS Score  ECG Heart Rate 99  Assess: if the MEWS score is Yellow or Red  Were vital signs taken at a resting state? No (No vitals were taken)  Focused Assessment Documented focused assessment  Early Detection of Sepsis Score *See Row Information* Low  MEWS guidelines implemented *See Row Information* No, other (Comment) (Patient has Afib)  Treat  MEWS Interventions Other (Comment) (Rechecked HR)  Document  Patient Outcome Other (Comment) (Patient is resting after medications)  Progress note created (see row info) Yes

## 2019-12-16 NOTE — Progress Notes (Signed)
PT Cancellation Note  Patient Details Name: Kristy Mckinney MRN: [id number] DOB: [birth year]   Cancelled Treatment:    Reason Eval/Treat Not Completed: Patient declined, no reason specified. Per RN, pt just transferred back to bed from recliner; pt is in pain and tired. Will continue to follow acutely.   Talbot Grumbling PT, DPT 12/16/19, 1:33 PM [phone]

## 2019-12-16 NOTE — Plan of Care (Signed)

## 2019-12-16 NOTE — Progress Notes (Signed)
Palliative:  HPI: 86 y.o. female  with past medical history of atrial fibrillation, diastolic CHF, GERD, anxiety, dementia, hypertension admitted on 12/13/2019 with syncopal episode related to acute on chronic diastolic CHF.    I met again today with Kristy Mckinney and daughter/HCPOA, Kristy Mckinney at bedside. Kristy Mckinney is still tired but beginning to feel better. Her kidney function has improved a little. We continued to discuss the trajectory and expectations of heart failure especially with kidney disease. Discussed fluid build up and importance of weight changes and plans for increased diuretics to stay on top of fluid as much as possible.   Reviewed goals of care and no changes with DNR maintained and she wishes to continue with current level of care although she is very frustrated and struggling with knowing that she will require future and likely frequent hospitalization until she decides that she wishes to focus on comfort. She is not ready for comfort but dreads having to come back to the hospital and also grieving her previous quality of life.   All questions/concerns addressed. Emotional support provided.   Exam: Alert, oriented. No distress. Breathing regular, unlabored with oxygen. Abd flat. Good urine output clear, yellow.   Plan: - Home with continued palliative care once medically ready.   [place], NP Palliative Medicine Team Pager [phone] (Please see [email or web address] for schedule) Team Phone [phone]    Greater than 50%  of this time was spent counseling and coordinating care related to the above assessment and plan

## 2019-12-17 ENCOUNTER — Encounter (HOSPITAL_COMMUNITY): Payer: Self-pay | Admitting: Internal Medicine

## 2019-12-17 LAB — BASIC METABOLIC PANEL
Anion gap: 14 (ref 5–15)
BUN: 24 mg/dL — ABNORMAL HIGH (ref 8–23)
CO2: 32 mmol/L (ref 22–32)
Calcium: 8.9 mg/dL (ref 8.9–10.3)
Chloride: 90 mmol/L — ABNORMAL LOW (ref 98–111)
Creatinine, Ser: 1.25 mg/dL — ABNORMAL HIGH (ref 0.44–1.00)
GFR calc Af Amer: 45 mL/min — ABNORMAL LOW (ref >60)
GFR calc non Af Amer: 39 mL/min — ABNORMAL LOW (ref >60)
Glucose, Bld: 164 mg/dL — ABNORMAL HIGH (ref 70–99)
Potassium: 3.6 mmol/L (ref 3.5–5.1)
Sodium: 136 mmol/L (ref 135–145)

## 2019-12-17 LAB — PROTEIN, BODY FLUID (OTHER): Total Protein, Body Fluid Other: 1.7 g/dL

## 2019-12-17 MED ORDER — APIXABAN 5 MG PO TABS
5.0000 mg | ORAL_TABLET | Freq: Two times a day (BID) | ORAL | Status: DC
  Administered 2019-12-17 – 2019-12-19 (×5): 5 mg via ORAL
  Filled 2019-12-17 (×6): qty 1

## 2019-12-17 NOTE — TOC Progression Note (Signed)
Transition of Care [facility]) - Progression Note    Patient Details  Name: Kristy Mckinney MRN: [id number] Date of Birth: September 05, 1931  [facility] CM/SW Contact  Ihor Gully, LCSW Phone Number: 12/17/2019, 1:36 PM  Clinical Narrative:    Patient previously active with [facility]. Per [place] with [facility] unable to accept patient at this time. TOC will continue to seek [facility] agency accepting of her insurance.   Expected Discharge Plan: Home/Self Care Barriers to Discharge: Continued Medical Work up  Expected Discharge Plan and Services Expected Discharge Plan: Home/Self Care In-house Referral: Clinical Social Work Discharge Planning Services: Follow-up appt scheduled   Living arrangements for the past 2 months: Apartment                 DME Arranged: 3-N-1 DME Agency: [employer] Date DME Agency Contacted: 12/17/19 Time DME Agency Contacted: [phone] Representative spoke with at DME Agency: [place] ([place]) Interventions    Readmission Risk Interventions Readmission Risk Prevention Plan 12/15/2019 09/12/2019 05/10/2019  Post Dischage Appt - - -  Appt Comments - - -  Medication Screening - - -  Transportation Screening Complete Complete Complete  Medication Review Press photographer) Complete Complete Complete  PCP or Specialist appointment within 3-5 days of discharge Complete - Complete  HRI or Home Care Consult Complete Complete Complete  SW Recovery Care/Counseling Consult Complete Complete Complete  Palliative Care Screening Complete Not Applicable Complete  [place] Not Applicable Not Applicable Not Applicable  Some recent data might be hidden

## 2019-12-17 NOTE — Progress Notes (Signed)
PROGRESS NOTE  Kristy Mckinney YQM:[id number] DOB: February 01, 1932 DOA: 12/13/2019 PCP: Celene Squibb, MD   Brief History: 86 year old female with a history of permanent atrial fibrillation, diastolic CHF, GERD, anxiety, dementia, hypertension presenting with a syncopal episode. The patient states that she was going across her kitchen to put a big potato in the microwave and started feeling dizzy. The next thing she remembered was waking up on the floor with the potato scattered over the floor. The patient has denied any recent chest pain, fevers, chills, palpitations, nausea, vomiting, diarrhea, abdominal pain, dysuria. She recently followed up with her cardiology office on 12/04/2019. Her torsemide was increased to 20 mg twice daily from 20 mg daily. She endorses compliance. However, the patient is not aware of any type of fluid restriction. She has noted increasing lower extremity edema and just feeling "tired" with ambulation. In the emergency department, the patient was noted to have oxygen saturation of 78% on room air. She was afebrile hemodynamically stable. She was placed on supplemental oxygen with oxygen saturation up to 97%. BMP showed a potassium 3.4 with unremarkable LFTs. WBC 8.4, hemoglobin 9.4, platelets 304,000. BNP was 902. Troponins were unremarkable. CT of the brain and C-spine were unremarkable. Chest x-ray showed bilateral pleural effusions, right greater than left. There is increased interstitial markings. The patient was started IV furosemide.  Assessment/Plan: Acute respiratory failure with hypoxia -Secondary to pulmonary edema pleural effusion -Presently stable on 2 L nasal cannula -Wean oxygen for saturation greater 92% -Personally reviewed chest x-ray--bilateral pleural effusions, right greater than left. Increased interstitial markings  Acute on chronic diastolic CHF -continue IV furosemide 40 mg IV twice daily -The patient remains clinically  fluid overloaded -pt endorses some indiscretion with fluid intake at home -Accurate I's andOs--incomplete -Daily weights -cardiology consult -Patient states that last weight at home was 150.4 pounds on 12/13/2019 -6/7 R-side thoracocentesis--1L removed  Syncope -Echo--EF 70-75%, grade 2DD, severe TR, severe elevated PASP -remain on tele -orthostatics -EEG--mild diffuse encephalopathy  Permanent atrial fibrillation -Rate controlled -Continue diltiazem CD -continue amiodarone -Hold metoprolol tartrate for now to allow blood pressure margin for diuresis -Continue apixaban  Cognitive impairment/dementia without behavioral disturbance -Continue Namenda and Seroquel -Continue sertraline  Hypokalemia -Replete -Check magnesium--2.3  Goals of care -palliative medicine consult -frequent hospitalizations -changed to DNR -Palliative care follow-up as an outpatient     Status is: Inpatient  The patient will require care spanning > 2 midnights and should be moved to inpatient because:IV treatments appropriate due to intensity of illness or inability to take PO Remains fluid overloaded, requires IV lasix  Dispo: The patient is from:Home Anticipated d/c is GQ:BVQX Anticipated d/c date is: 1-2 days Patient currentlyis not medically stable to d/c.  Family Communication:NoFamily at bedside  Consultants:Cardiology, palliative care  Code Status: FULL  DVT Prophylaxis:apixaban   Procedures: As Listed in Progress Note Above  Antibiotics: None    Subjective: Still has shortness of breath.  Has difficulty completing a sentence.  Objective: Vitals:   12/16/19 2136 12/17/19 0500 12/17/19 1451 12/17/19 1529  BP: 112/76 135/60 (!) 99/58 134/61  Pulse: 94 (!) 104 96 91  Resp: 18 20 16    Temp: 98.9 F (37.2 C) 98.2 F (36.8 C) 97.8 F (36.6 C)   TempSrc:  Oral Oral   SpO2: 95% 91% 98%   Weight:          Intake/Output Summary (Last 24 hours) at 12/17/2019 4503 Last data filed  at 12/17/2019 0500 Gross per 24 hour  Intake --  Output 500 ml  Net -500 ml   Weight change:  Exam:  General exam: Alert, awake, oriented x 3 Respiratory system: Diminished breath sounds at bases.  Increased respiratory effort. Cardiovascular system:RRR. No murmurs, rubs, gallops. Gastrointestinal system: Abdomen is nondistended, soft and nontender. No organomegaly or masses felt. Normal bowel sounds heard. Central nervous system: Alert and oriented. No focal neurological deficits. Extremities: 1+ edema bilaterally Skin: No rashes, lesions or ulcers  Psychiatry: Judgement and insight appear normal. Mood & affect appropriate.      Data Reviewed: I have personally reviewed following labs and imaging studies Basic Metabolic Panel: Recent Labs  Lab 12/13/19 2152 12/14/19 0904 12/15/19 0549 12/16/19 0549 12/17/19 0843  NA 141 141 139 137 136  K 3.4* 3.6 3.3* 3.0* 3.6  CL 93* 96* 94* 91* 90*  CO2 34* 32 31 35* 32  GLUCOSE 129* 132* 116* 121* 164*  BUN 47* 43* 39* 31* 24*  CREATININE 2.22* 2.09* 1.89* 1.42* 1.25*  CALCIUM 9.4 8.9 9.1 8.5* 8.9  MG  --   --  2.3  --   --    Liver Function Tests: Recent Labs  Lab 12/13/19 2152  AST 37  ALT 32  ALKPHOS 127*  BILITOT 1.1  PROT 7.1  ALBUMIN 3.9   No results for input(s): LIPASE, AMYLASE in the last 168 hours. No results for input(s): AMMONIA in the last 168 hours. Coagulation Profile: No results for input(s): INR, PROTIME in the last 168 hours. CBC: Recent Labs  Lab 12/13/19 2152  WBC 8.4  NEUTROABS 6.3  HGB 9.4*  HCT 32.5*  MCV 78.9*  PLT 304   Cardiac Enzymes: No results for input(s): CKTOTAL, CKMB, CKMBINDEX, TROPONINI in the last 168 hours. BNP: Invalid input(s): POCBNP CBG: No results for input(s): GLUCAP in the last 168 hours. HbA1C: No results for input(s): HGBA1C in the last 72 hours. Urine analysis:    Component Value  Date/Time   COLORURINE YELLOW 09/10/2019 1248   APPEARANCEUR CLEAR 09/10/2019 1248   LABSPEC 1.018 09/10/2019 1248   PHURINE 6.0 09/10/2019 1248   GLUCOSEU NEGATIVE 09/10/2019 1248   HGBUR NEGATIVE 09/10/2019 1248   BILIRUBINUR NEGATIVE 09/10/2019 1248   [place] 09/10/2019 1248   PROTEINUR 100 (A) 09/10/2019 1248   NITRITE NEGATIVE 09/10/2019 1248   LEUKOCYTESUR NEGATIVE 09/10/2019 1248   Sepsis Labs: @LABRCNTIP (procalcitonin:4,lacticidven:4) ) Recent Results (from the past 240 hour(s))  SARS Coronavirus 2 by RT PCR (hospital order, performed in [place] hospital lab) Nasopharyngeal Nasopharyngeal Swab     Status: None   Collection Time: 12/13/19 10:01 PM   Specimen: Nasopharyngeal Swab  Result Value Ref Range Status   SARS Coronavirus 2 NEGATIVE NEGATIVE Final    Comment: (NOTE) SARS-CoV-2 target nucleic acids are NOT DETECTED. The SARS-CoV-2 RNA is generally detectable in upper and lower respiratory specimens during the acute phase of infection. The lowest concentration of SARS-CoV-2 viral copies this assay can detect is 250 copies / mL. A negative result does not preclude SARS-CoV-2 infection and should not be used as the sole basis for treatment or other patient management decisions.  A negative result may occur with improper specimen collection / handling, submission of specimen other than nasopharyngeal swab, presence of viral mutation(s) within the areas targeted by this assay, and inadequate number of viral copies (<250 copies / mL). A negative result must be combined with clinical observations, patient history, and epidemiological information. Fact Sheet for  Patients:   StrictlyIdeas.no Fact Sheet for Healthcare Providers: BankingDealers.co.za This test is not yet approved or cleared  by the [place] FDA and has been authorized for detection and/or diagnosis of SARS-CoV-2 by FDA under an Emergency Use  Authorization (EUA).  This EUA will remain in effect (meaning this test can be used) for the duration of the COVID-19 declaration under Section 564(b)(1) of the Act, 21 U.S.C. section 360bbb-3(b)(1), unless the authorization is terminated or revoked sooner. Performed at [facility], [address] [place], [place] [address]   Culture, body fluid-bottle     Status: None (Preliminary result)   Collection Time: 12/15/19  1:05 PM   Specimen: Fluid  Result Value Ref Range Status   Specimen Description FLUID PLEURAL COLLECTED BY DOCTOR  Final   Special Requests BOTTLES DRAWN AEROBIC AND ANAEROBIC 10CC EACH  Final   Culture   Final    NO GROWTH 2 DAYS Performed at [facility], [place], [address] [place], [place] [address]    Report Status PENDING  Incomplete  Gram stain     Status: None   Collection Time: 12/15/19  1:05 PM   Specimen: Pleura  Result Value Ref Range Status   Specimen Description PLEURAL  Final   Special Requests PLEURAL  Final   Gram Stain   Final    CYTOSPIN SMEAR WBC PRESENT, PREDOMINANTLY MONONUCLEAR NO ORGANISMS SEEN Performed at [facility] Performed at [facility], [address], [place], [place] [address]    Report Status 12/15/2019 FINAL  Final     Scheduled Meds: . amiodarone  200 mg Oral Daily  . apixaban  5 mg Oral BID  . diltiazem  120 mg Oral Daily  . furosemide  40 mg Intravenous BID  . memantine  5 mg Oral BID  . potassium chloride  40 mEq Oral Daily  . QUEtiapine  25 mg Oral QHS  . sertraline  50 mg Oral QHS  . sodium chloride flush  3 mL Intravenous Q12H   Continuous Infusions: . sodium chloride      Procedures/Studies: DG Chest 1 View  Result Date: 12/15/2019 CLINICAL DATA:  Post RIGHT thoracentesis EXAM: CHEST  1 VIEW COMPARISON:  12/13/2019 FINDINGS: Enlargement of cardiac silhouette with mitral annular calcification. Atherosclerotic calcification aorta. Pulmonary vascular congestion. Small bibasilar pleural effusions and  atelectasis decreased on RIGHT since prior exam post thoracentesis. Mild perihilar edema. No pneumothorax. Bones demineralized. IMPRESSION: No pneumothorax following RIGHT thoracentesis. Enlargement of cardiac silhouette with pulmonary vascular congestion and mild pulmonary edema. Bibasilar effusions and atelectasis decreased on RIGHT since prior study Electronically Signed   By: Lavonia Dana M.D.   On: 12/15/2019 13:32   CT Head Wo Contrast  Result Date: 12/13/2019 CLINICAL DATA:  [place], back pain EXAM: CT HEAD WITHOUT CONTRAST CT CERVICAL SPINE WITHOUT CONTRAST TECHNIQUE: Multidetector CT imaging of the head and cervical spine was performed following the standard protocol without intravenous contrast. Multiplanar CT image reconstructions of the cervical spine were also generated. COMPARISON:  11/28/2018 FINDINGS: CT HEAD FINDINGS Brain: Chronic small vessel ischemic changes are seen within the periventricular white matter. No acute infarct or hemorrhage. Lateral ventricles and midline structures are unremarkable. No acute extra-axial fluid collections. No mass effect. Vascular: No hyperdense vessel or unexpected calcification. Skull: There is a small ex of little scalp hematoma. No underlying fracture. Remainder of the calvarium is unremarkable. Sinuses/Orbits: No acute finding. Other: None. CT CERVICAL SPINE FINDINGS Alignment: Alignment is grossly anatomic. Skull base and vertebrae: No  acute displaced fractures. Soft tissues and spinal canal: No prevertebral fluid or swelling. No visible canal hematoma. Disc levels: Disc space narrowing and osteophyte formation is seen from C3-4 through C6-7, most pronounced at the C5-6 level. At C3-4 there is minimal symmetrical neural foraminal encroachment. At C4-5 there is mild central stenosis with bilateral right greater than left neural foraminal encroachment. At C5-6 there is at least moderate to severe central canal stenosis with significant right greater than left  neural foraminal encroachment. At C6-7 there is mild central stenosis with severe right greater than left neural foraminal encroachment. Upper chest: Airway is patent. Large right pleural effusion partially visualized. Left apex is clear. Other: Reconstructed images demonstrate no additional findings. IMPRESSION: 1. No acute intracranial process. 2. No acute cervical spine fracture. 3. Significant multilevel cervical spondylosis, most pronounced at C5-6. Significant but less severe changes are seen at C4-5 and C6-7 as above. 4. Large right pleural effusion. Electronically Signed   By: Randa Ngo M.D.   On: 12/13/2019 23:22   CT Cervical Spine Wo Contrast  Result Date: 12/13/2019 CLINICAL DATA:  [place], back pain EXAM: CT HEAD WITHOUT CONTRAST CT CERVICAL SPINE WITHOUT CONTRAST TECHNIQUE: Multidetector CT imaging of the head and cervical spine was performed following the standard protocol without intravenous contrast. Multiplanar CT image reconstructions of the cervical spine were also generated. COMPARISON:  11/28/2018 FINDINGS: CT HEAD FINDINGS Brain: Chronic small vessel ischemic changes are seen within the periventricular white matter. No acute infarct or hemorrhage. Lateral ventricles and midline structures are unremarkable. No acute extra-axial fluid collections. No mass effect. Vascular: No hyperdense vessel or unexpected calcification. Skull: There is a small ex of little scalp hematoma. No underlying fracture. Remainder of the calvarium is unremarkable. Sinuses/Orbits: No acute finding. Other: None. CT CERVICAL SPINE FINDINGS Alignment: Alignment is grossly anatomic. Skull base and vertebrae: No acute displaced fractures. Soft tissues and spinal canal: No prevertebral fluid or swelling. No visible canal hematoma. Disc levels: Disc space narrowing and osteophyte formation is seen from C3-4 through C6-7, most pronounced at the C5-6 level. At C3-4 there is minimal symmetrical neural foraminal encroachment.  At C4-5 there is mild central stenosis with bilateral right greater than left neural foraminal encroachment. At C5-6 there is at least moderate to severe central canal stenosis with significant right greater than left neural foraminal encroachment. At C6-7 there is mild central stenosis with severe right greater than left neural foraminal encroachment. Upper chest: Airway is patent. Large right pleural effusion partially visualized. Left apex is clear. Other: Reconstructed images demonstrate no additional findings. IMPRESSION: 1. No acute intracranial process. 2. No acute cervical spine fracture. 3. Significant multilevel cervical spondylosis, most pronounced at C5-6. Significant but less severe changes are seen at C4-5 and C6-7 as above. 4. Large right pleural effusion. Electronically Signed   By: Randa Ngo M.D.   On: 12/13/2019 23:22   DG Pelvis Portable  Result Date: 12/13/2019 CLINICAL DATA:  [place], back pain EXAM: PORTABLE PELVIS 1-2 VIEWS COMPARISON:  None. FINDINGS: Single frontal view of the pelvis demonstrates postsurgical changes right hip. No acute displaced fracture. Left convex scoliosis lumbar spine. Sacroiliac joints are normal. Soft tissues are unremarkable. IMPRESSION: 1. No acute displaced fracture. Electronically Signed   By: Randa Ngo M.D.   On: 12/13/2019 22:46   DG Chest Portable 1 View  Result Date: 12/13/2019 CLINICAL DATA:  [place], pain EXAM: PORTABLE CHEST 1 VIEW COMPARISON:  10/13/2019 FINDINGS: Single frontal view of the chest demonstrates an enlarged right  pleural effusion with right basilar atelectasis. Left basilar consolidation unchanged. No pneumothorax. Cardiac silhouette is stable. Atherosclerosis aortic arch. No acute displaced fractures. IMPRESSION: 1. Enlarging right pleural effusion. 2. Persistent bibasilar areas of consolidation likely atelectasis, increased on the right since prior study. Electronically Signed   By: Randa Ngo M.D.   On: 12/13/2019 22:43    EEG adult  Result Date: 12/15/2019 Lora Havens, MD     12/15/2019  2:20 PM Patient Name: PRAJNA VANDERPOOL MRN: [id number] Epilepsy Attending: Lora Havens Referring Physician/Provider: Dr. Shanon Brow Tat Date: 12/15/2019 Duration: 23.39 minutes Patient history: 44 old female with syncope.  EEG evaluate for seizures. Level of alertness: Awake AEDs during EEG study: None Technical aspects: This EEG study was done with scalp electrodes positioned according to the 10-20 International system of electrode placement. Electrical activity was acquired at a sampling rate of 500Hz  and reviewed with a high frequency filter of 70Hz  and a low frequency filter of 1Hz . EEG data were recorded continuously and digitally stored. Description: The posterior dominant rhythm consists of 8 Hz activity of moderate voltage (25-35 uV) seen predominantly in posterior head regions, symmetric and reactive to eye opening and eye closing. EEG showed intermittent generalized 3 to 6 Hz theta-delta slowing. Hyperventilation and photic stimulation were not performed.   ABNORMALITY -Intermittent slow, generalized IMPRESSION: This study is suggestive of mild diffuse encephalopathy, nonspecific etiology. No seizures or epileptiform discharges were seen throughout the recording. Lora Havens   ECHOCARDIOGRAM COMPLETE  Result Date: 12/14/2019    ECHOCARDIOGRAM REPORT   Patient Name:   JAILINE LIEDER Date of Exam: 12/14/2019 Medical Rec #:  [id number]         Height:       64.5 in Accession #:    [id number]        Weight:       142.0 lb Date of Birth:  Jan 17, 1932         BSA:          1.700 m Patient Age:    50 years          BP:           111/66 mmHg Patient Gender: F                 HR:           101 bpm. Exam Location:  [facility] Procedure: 2D Echo, Cardiac Doppler and Color Doppler Indications:    CHF  History:        Patient has prior history of Echocardiogram examinations, most                 recent 04/04/2019. CHF, Arrythmias:Atrial  Fibrillation,                 Signs/Symptoms:Dyspnea; Risk Factors:Hypertension, Dyslipidemia                 and Dementia.  Sonographer:    Dustin Flock RDCS Referring Phys: [id number] DAVID TAT IMPRESSIONS  1. Left ventricular ejection fraction, by estimation, is 70 to 75%. The left ventricle has hyperdynamic function. The left ventricle has no regional wall motion abnormalities. Left ventricular diastolic parameters are consistent with Grade II diastolic dysfunction (pseudonormalization). Elevated left atrial pressure.  2. Right ventricular systolic function is normal. The right ventricular size is normal. There is severely elevated pulmonary artery systolic pressure. The estimated right ventricular systolic pressure is 70.6 mmHg.  3. Left atrial size was moderately dilated.  4. Right atrial size was mildly dilated.  5. The mitral valve is degenerative. Trivial mitral valve regurgitation. Moderate mitral stenosis. The mean mitral valve gradient is 8.0 mmHg.  6. Tricuspid valve regurgitation is severe.  7. The aortic valve is tricuspid. Aortic valve regurgitation is not visualized. Mild to moderate aortic valve sclerosis/calcification is present, without any evidence of aortic stenosis. Aortic valve mean gradient measures 7.0 mmHg. Aortic valve Vmax measures 1.76 m/s.  8. The inferior vena cava is normal in size with <50% respiratory variability, suggesting right atrial pressure of 8 mmHg. Comparison(s): No significant change from prior study. Prior images reviewed side by side. FINDINGS  Left Ventricle: Left ventricular ejection fraction, by estimation, is 70 to 75%. The left ventricle has hyperdynamic function. The left ventricle has no regional wall motion abnormalities. The left ventricular internal cavity size was normal in size. There is no left ventricular hypertrophy. Left ventricular diastolic parameters are consistent with Grade II diastolic dysfunction (pseudonormalization). Elevated left atrial  pressure. Right Ventricle: The right ventricular size is normal. No increase in right ventricular wall thickness. Right ventricular systolic function is normal. There is severely elevated pulmonary artery systolic pressure. The tricuspid regurgitant velocity is 4.21 m/s, and with an assumed right atrial pressure of 3 mmHg, the estimated right ventricular systolic pressure is 54.0 mmHg. Left Atrium: Left atrial size was moderately dilated. Right Atrium: Right atrial size was mildly dilated. Pericardium: There is no evidence of pericardial effusion. Mitral Valve: The mitral valve is degenerative in appearance. There is severe thickening of the mitral valve leaflet(s). There is severe calcification of the mitral valve leaflet(s). Normal mobility of the mitral valve leaflets. Severe mitral annular calcification. Trivial mitral valve regurgitation. Moderate mitral valve stenosis. MV peak gradient, 17.6 mmHg. The mean mitral valve gradient is 8.0 mmHg. Tricuspid Valve: The tricuspid valve is normal in structure. Tricuspid valve regurgitation is severe. No evidence of tricuspid stenosis. Aortic Valve: The aortic valve is tricuspid. Aortic valve regurgitation is not visualized. Mild to moderate aortic valve sclerosis/calcification is present, without any evidence of aortic stenosis. Aortic valve mean gradient measures 7.0 mmHg. Aortic valve peak gradient measures 12.4 mmHg. Aortic valve area, by VTI measures 2.21 cm. Pulmonic Valve: The pulmonic valve was normal in structure. Pulmonic valve regurgitation is mild. No evidence of pulmonic stenosis. Aorta: The aortic root is normal in size and structure. Venous: The inferior vena cava is normal in size with less than 50% respiratory variability, suggesting right atrial pressure of 8 mmHg. IAS/Shunts: No atrial level shunt detected by color flow Doppler.  LEFT VENTRICLE PLAX 2D LVIDd:         3.29 cm  Diastology LVIDs:         2.34 cm  LV e' lateral:   7.10 cm/s LV PW:          1.06 cm  LV E/e' lateral: 25.5 LV IVS:        0.96 cm  LV e' medial:    3.42 cm/s LVOT diam:     2.00 cm  LV E/e' medial:  52.9 LV SV:         74 LV SV Index:   44 LVOT Area:     3.14 cm  RIGHT VENTRICLE RV Basal diam:  3.47 cm RV S prime:     5.98 cm/s TAPSE (M-mode): 2.1 cm LEFT ATRIUM             Index       RIGHT ATRIUM  Index LA diam:        5.40 cm 3.18 cm/m  RA Area:     19.10 cm LA Vol (A2C):   77.1 ml 45.34 ml/m RA Volume:   53.00 ml  31.17 ml/m LA Vol (A4C):   79.3 ml 46.64 ml/m LA Biplane Vol: 82.6 ml 48.58 ml/m  AORTIC VALVE AV Area (Vmax):    1.77 cm AV Area (Vmean):   1.79 cm AV Area (VTI):     2.21 cm AV Vmax:           176.00 cm/s AV Vmean:          121.000 cm/s AV VTI:            0.336 m AV Peak Grad:      12.4 mmHg AV Mean Grad:      7.0 mmHg LVOT Vmax:         98.90 cm/s LVOT Vmean:        68.900 cm/s LVOT VTI:          0.236 m LVOT/AV VTI ratio: 0.70  AORTA Ao Root diam: 2.90 cm MITRAL VALVE                TRICUSPID VALVE MV Area (PHT): 3.42 cm     TR Peak grad:   70.9 mmHg MV Peak grad:  17.6 mmHg    TR Vmax:        421.00 cm/s MV Mean grad:  8.0 mmHg MV Vmax:       2.10 m/s     SHUNTS MV Vmean:      128.5 cm/s   Systemic VTI:  0.24 m MV Decel Time: 222 msec     Systemic Diam: 2.00 cm MV E velocity: 181.00 cm/s MV A velocity: 66.30 cm/s MV E/A ratio:  2.73 Candee Furbish MD Electronically signed by Candee Furbish MD Signature Date/Time: 12/14/2019/12:34:52 PM    Final    US THORACENTESIS ASP PLEURAL SPACE W/IMG GUIDE  Result Date: 12/15/2019 INDICATION: Recurrent RIGHT pleural effusion EXAM: ULTRASOUND GUIDED DIAGNOSTIC AND THERAPEUTIC RIGHT THORACENTESIS MEDICATIONS: None. COMPLICATIONS: None immediate. PROCEDURE: An ultrasound guided thoracentesis was thoroughly discussed with the patient and questions answered. The benefits, risks, alternatives and complications were also discussed. The patient understands and wishes to proceed with the procedure. Written consent was obtained.  Ultrasound was performed to localize and mark an adequate pocket of fluid in the RIGHT chest. The area was then prepped and draped in the normal sterile fashion. 1% Lidocaine was used for local anesthesia. Under ultrasound guidance a 8 French thoracentesis catheter was introduced. Thoracentesis was performed. The catheter was removed and a dressing applied. FINDINGS: A total of approximately 1 L of amber colored RIGHT pleural fluid was removed. Samples were sent to the laboratory as requested by the clinical team. IMPRESSION: Successful ultrasound guided RIGHT thoracentesis yielding 1 L of pleural fluid. Electronically Signed   By: Lavonia Dana M.D.   On: 12/15/2019 13:44    Kathie Dike, MD  [facility]  If 7PM-7AM, please contact night-coverage [email or web address]  12/17/2019, 6:22 PM   LOS: 3 days

## 2019-12-17 NOTE — Consult Note (Signed)
Cardiology Consultation:   Patient ID: Kristy Mckinney; [id number]; Sep 08, 1931   Admit date: 12/13/2019 Date of Consult: 12/17/2019  Primary Care Provider: Celene Squibb, MD Primary Cardiologist: Rozann Lesches, MD Primary Electrophysiologist: None   Patient Profile:   Kristy Mckinney is an 86 y.o. female with a history of permanent atrial fibrillation, chronic diastolic heart failure, mitral stenosis, severe pulmonary pretension, GERD, essential hypertension, and dementia who is being seen today for the evaluation of acute on chronic diastolic heart failure at the request of Dr. Roderic Palau.  History of Present Illness:   Kristy Mckinney was admitted to the hospital on June 6 with recent weakness, fall, and reportedly worsening leg swelling.  She was noted to have hypoxic respiratory failure in the ER with evidence of fluid overload, also enlarging right-sided pleural effusion.  She was started on IV diuretic, also underwent right-sided thoracentesis with removal of 1 L.  At baseline she is on Cardizem CD and amiodarone as well as Eliquis for management of her atrial fibrillation.  Echocardiogram revealed LVEF 70 to 75% with moderate diastolic dysfunction, normal RV contraction but severe pulmonary pretension with RVSP 74 mmHg, moderate left atrial enlargement, moderate mitral stenosis, and severe tricuspid regurgitation.  She was seen recently in the office by Mr. Leonides Sake NP on June 4 and was on Demadex 20 mg twice daily at that point.  She has been seen recently by the palliative care team.  Past Medical History:  Diagnosis Date  . Anxiety   . Atrial fibrillation ([place])   . Chronic back pain   . Chronic diastolic heart failure ([place])   . Dementia ([place])   . Disorder of left temporomandibular joint   . Essential hypertension   . GERD (gastroesophageal reflux disease)   . History of bronchitis   . Iron deficiency anemia   . Irritable bowel syndrome with diarrhea   . Mitral stenosis   .  Osteoporosis   . Pneumonia   . Pulmonary hypertension ([place])     Past Surgical History:  Procedure Laterality Date  . ABDOMINAL HYSTERECTOMY    . BACK SURGERY    . CHOLECYSTECTOMY N/A 01/29/2014   Procedure: LAPAROSCOPIC CHOLECYSTECTOMY;  Surgeon: Scherry Ran, MD;  Location: [facility];  Service: General;  Laterality: N/A;  . COLONOSCOPY N/A 10/27/2015   Procedure: COLONOSCOPY;  Surgeon: Rogene Houston, MD;  Location: [facility];  Service: Endoscopy;  Laterality: N/A;  215  . INTRAMEDULLARY (IM) NAIL INTERTROCHANTERIC Right 11/29/2018   Procedure: INTRAMEDULLARY (IM) NAIL INTERTROCHANTRIC FRACTURE;  Surgeon: Renette Butters, MD;  Location: [facility];  Service: Orthopedics;  Laterality: Right;  . TONSILLECTOMY    . YAG LASER APPLICATION Left 40/98/1191   Procedure: YAG LASER APPLICATION;  Surgeon: Elta Guadeloupe T. Gershon Crane, MD;  Location: [facility];  Service: Ophthalmology;  Laterality: Left;  left     Inpatient Medications: Scheduled Meds: . amiodarone  200 mg Oral Daily  . apixaban  2.5 mg Oral BID  . diltiazem  120 mg Oral Daily  . furosemide  40 mg Intravenous BID  . memantine  5 mg Oral BID  . potassium chloride  40 mEq Oral Daily  . QUEtiapine  25 mg Oral QHS  . sertraline  50 mg Oral QHS  . sodium chloride flush  3 mL Intravenous Q12H   Continuous Infusions: . sodium chloride     PRN Meds: sodium chloride, acetaminophen, ondansetron (ZOFRAN) IV, sodium chloride flush  Allergies:    Allergies  Allergen Reactions  .  Penicillins Anaphylaxis    Did it involve swelling of the face/tongue/throat, SOB, or low BP? Unknown Did it involve sudden or severe rash/hives, skin peeling, or any reaction on the inside of your mouth or nose? Unknown Did you need to seek medical attention at a hospital or doctor's office? Unknown When did it last happen? If all above answers are "NO", may proceed with cephalosporin use.   . Biaxin [Clarithromycin]   . Ciprofloxacin Nausea And Vomiting    . Emetrol Nausea Only    swelling  . Feldene [Piroxicam] Nausea And Vomiting  . Sulfa Antibiotics     swelling    Social History:   Social History   Tobacco Use  . Smoking status: Never Smoker  . Smokeless tobacco: Never Used  Substance Use Topics  . Alcohol use: No    Family History:   The patient's family history includes Hypertension in her child.  ROS:  Please see the history of present illness.  All other ROS reviewed and negative.     Physical Exam/Data:   Vitals:   12/16/19 0456 12/16/19 1338 12/16/19 2136 12/17/19 0500  BP: (!) 120/54 (!) 119/43 112/76 135/60  Pulse: 87 92 94 (!) 104  Resp: 20 18 18 20   Temp: 98.1 F (36.7 C) 97.9 F (36.6 C) 98.9 F (37.2 C) 98.2 F (36.8 C)  TempSrc: Oral Oral  Oral  SpO2: 93% 96% 95% 91%  Weight: 61.3 kg       Intake/Output Summary (Last 24 hours) at 12/17/2019 0930 Last data filed at 12/17/2019 0500 Gross per 24 hour  Intake --  Output 500 ml  Net -500 ml   Filed Weights   12/16/19 0456  Weight: 61.3 kg   Body mass index is 22.84 kg/m.   Gen: Elderly woman, no distress. HEENT: Conjunctiva and lids normal, oropharynx clear with moist mucosa. Neck: Supple, no elevated JVP or carotid bruits, no thyromegaly. Lungs: Decreased breath sounds right base, nonlabored breathing at rest. Cardiac: Irregularly irregular, no S3, 2/6 apical systolic murmur, no pericardial rub. Abdomen: Soft, nontender, bowel sounds present. Extremities: Improved leg edema, distal pulses 2+. Skin: Warm and dry. Musculoskeletal: Mild kyphosis. Neuropsychiatric: Alert and oriented to name and situation, not details of recent symptoms or presentation.  EKG:  An ECG dated 12/13/2019 was personally reviewed today and demonstrated:  Atrial fibrillation with poor R wave progression and left anterior fascicular block.  Telemetry:  I personally reviewed telemetry which shows atrial fibrillation.  Relevant CV Studies:  Echocardiogram 12/14/2019: 1.  Left ventricular ejection fraction, by estimation, is 70 to 75%. The  left ventricle has hyperdynamic function. The left ventricle has no  regional wall motion abnormalities. Left ventricular diastolic parameters  are consistent with Grade II diastolic  dysfunction (pseudonormalization). Elevated left atrial pressure.  2. Right ventricular systolic function is normal. The right ventricular  size is normal. There is severely elevated pulmonary artery systolic  pressure. The estimated right ventricular systolic pressure is 84.6 mmHg.  3. Left atrial size was moderately dilated.  4. Right atrial size was mildly dilated.  5. The mitral valve is degenerative. Trivial mitral valve regurgitation.  Moderate mitral stenosis. The mean mitral valve gradient is 8.0 mmHg.  6. Tricuspid valve regurgitation is severe.  7. The aortic valve is tricuspid. Aortic valve regurgitation is not  visualized. Mild to moderate aortic valve sclerosis/calcification is  present, without any evidence of aortic stenosis. Aortic valve mean  gradient measures 7.0 mmHg. Aortic valve Vmax  measures 1.76  m/s.  8. The inferior vena cava is normal in size with <50% respiratory  variability, suggesting right atrial pressure of 8 mmHg.   Laboratory Data:  Chemistry Recent Labs  Lab 12/15/19 0549 12/16/19 0549 12/17/19 0843  NA 139 137 136  K 3.3* 3.0* 3.6  CL 94* 91* 90*  CO2 31 35* 32  GLUCOSE 116* 121* 164*  BUN 39* 31* 24*  CREATININE 1.89* 1.42* 1.25*  CALCIUM 9.1 8.5* 8.9  GFRNONAA 24* 34* 39*  GFRAA 28* 39* 45*  ANIONGAP 14 11 14     Recent Labs  Lab 12/13/19 2152  PROT 7.1  ALBUMIN 3.9  AST 37  ALT 32  ALKPHOS 127*  BILITOT 1.1   Hematology Recent Labs  Lab 12/13/19 2152  WBC 8.4  RBC 4.12  HGB 9.4*  HCT 32.5*  MCV 78.9*  MCH 22.8*  MCHC 28.9*  RDW 19.6*  PLT 304   Cardiac Enzymes Recent Labs  Lab 12/13/19 2201 12/13/19 2311  TROPONINIHS 15 14   BNP Recent Labs  Lab  12/13/19 2201  BNP 902.0*     Radiology/Studies:  DG Chest 1 View  Result Date: 12/15/2019 CLINICAL DATA:  Post RIGHT thoracentesis EXAM: CHEST  1 VIEW COMPARISON:  12/13/2019 FINDINGS: Enlargement of cardiac silhouette with mitral annular calcification. Atherosclerotic calcification aorta. Pulmonary vascular congestion. Small bibasilar pleural effusions and atelectasis decreased on RIGHT since prior exam post thoracentesis. Mild perihilar edema. No pneumothorax. Bones demineralized. IMPRESSION: No pneumothorax following RIGHT thoracentesis. Enlargement of cardiac silhouette with pulmonary vascular congestion and mild pulmonary edema. Bibasilar effusions and atelectasis decreased on RIGHT since prior study Electronically Signed   By: Lavonia Dana M.D.   On: 12/15/2019 13:32   CT Head Wo Contrast  Result Date: 12/13/2019 CLINICAL DATA:  [place], back pain EXAM: CT HEAD WITHOUT CONTRAST CT CERVICAL SPINE WITHOUT CONTRAST TECHNIQUE: Multidetector CT imaging of the head and cervical spine was performed following the standard protocol without intravenous contrast. Multiplanar CT image reconstructions of the cervical spine were also generated. COMPARISON:  11/28/2018 FINDINGS: CT HEAD FINDINGS Brain: Chronic small vessel ischemic changes are seen within the periventricular white matter. No acute infarct or hemorrhage. Lateral ventricles and midline structures are unremarkable. No acute extra-axial fluid collections. No mass effect. Vascular: No hyperdense vessel or unexpected calcification. Skull: There is a small ex of little scalp hematoma. No underlying fracture. Remainder of the calvarium is unremarkable. Sinuses/Orbits: No acute finding. Other: None. CT CERVICAL SPINE FINDINGS Alignment: Alignment is grossly anatomic. Skull base and vertebrae: No acute displaced fractures. Soft tissues and spinal canal: No prevertebral fluid or swelling. No visible canal hematoma. Disc levels: Disc space narrowing and  osteophyte formation is seen from C3-4 through C6-7, most pronounced at the C5-6 level. At C3-4 there is minimal symmetrical neural foraminal encroachment. At C4-5 there is mild central stenosis with bilateral right greater than left neural foraminal encroachment. At C5-6 there is at least moderate to severe central canal stenosis with significant right greater than left neural foraminal encroachment. At C6-7 there is mild central stenosis with severe right greater than left neural foraminal encroachment. Upper chest: Airway is patent. Large right pleural effusion partially visualized. Left apex is clear. Other: Reconstructed images demonstrate no additional findings. IMPRESSION: 1. No acute intracranial process. 2. No acute cervical spine fracture. 3. Significant multilevel cervical spondylosis, most pronounced at C5-6. Significant but less severe changes are seen at C4-5 and C6-7 as above. 4. Large right pleural effusion. Electronically Signed   By:  Randa Ngo M.D.   On: 12/13/2019 23:22   CT Cervical Spine Wo Contrast  Result Date: 12/13/2019 CLINICAL DATA:  [place], back pain EXAM: CT HEAD WITHOUT CONTRAST CT CERVICAL SPINE WITHOUT CONTRAST TECHNIQUE: Multidetector CT imaging of the head and cervical spine was performed following the standard protocol without intravenous contrast. Multiplanar CT image reconstructions of the cervical spine were also generated. COMPARISON:  11/28/2018 FINDINGS: CT HEAD FINDINGS Brain: Chronic small vessel ischemic changes are seen within the periventricular white matter. No acute infarct or hemorrhage. Lateral ventricles and midline structures are unremarkable. No acute extra-axial fluid collections. No mass effect. Vascular: No hyperdense vessel or unexpected calcification. Skull: There is a small ex of little scalp hematoma. No underlying fracture. Remainder of the calvarium is unremarkable. Sinuses/Orbits: No acute finding. Other: None. CT CERVICAL SPINE FINDINGS Alignment:  Alignment is grossly anatomic. Skull base and vertebrae: No acute displaced fractures. Soft tissues and spinal canal: No prevertebral fluid or swelling. No visible canal hematoma. Disc levels: Disc space narrowing and osteophyte formation is seen from C3-4 through C6-7, most pronounced at the C5-6 level. At C3-4 there is minimal symmetrical neural foraminal encroachment. At C4-5 there is mild central stenosis with bilateral right greater than left neural foraminal encroachment. At C5-6 there is at least moderate to severe central canal stenosis with significant right greater than left neural foraminal encroachment. At C6-7 there is mild central stenosis with severe right greater than left neural foraminal encroachment. Upper chest: Airway is patent. Large right pleural effusion partially visualized. Left apex is clear. Other: Reconstructed images demonstrate no additional findings. IMPRESSION: 1. No acute intracranial process. 2. No acute cervical spine fracture. 3. Significant multilevel cervical spondylosis, most pronounced at C5-6. Significant but less severe changes are seen at C4-5 and C6-7 as above. 4. Large right pleural effusion. Electronically Signed   By: Randa Ngo M.D.   On: 12/13/2019 23:22   DG Pelvis Portable  Result Date: 12/13/2019 CLINICAL DATA:  [place], back pain EXAM: PORTABLE PELVIS 1-2 VIEWS COMPARISON:  None. FINDINGS: Single frontal view of the pelvis demonstrates postsurgical changes right hip. No acute displaced fracture. Left convex scoliosis lumbar spine. Sacroiliac joints are normal. Soft tissues are unremarkable. IMPRESSION: 1. No acute displaced fracture. Electronically Signed   By: Randa Ngo M.D.   On: 12/13/2019 22:46   DG Chest Portable 1 View  Result Date: 12/13/2019 CLINICAL DATA:  [place], pain EXAM: PORTABLE CHEST 1 VIEW COMPARISON:  10/13/2019 FINDINGS: Single frontal view of the chest demonstrates an enlarged right pleural effusion with right basilar atelectasis.  Left basilar consolidation unchanged. No pneumothorax. Cardiac silhouette is stable. Atherosclerosis aortic arch. No acute displaced fractures. IMPRESSION: 1. Enlarging right pleural effusion. 2. Persistent bibasilar areas of consolidation likely atelectasis, increased on the right since prior study. Electronically Signed   By: Randa Ngo M.D.   On: 12/13/2019 22:43   EEG adult  Result Date: 12/15/2019 Lora Havens, MD     12/15/2019  2:20 PM Patient Name: HERMILA MILLIS MRN: [id number] Epilepsy Attending: Lora Havens Referring Physician/Provider: Dr. Shanon Brow Tat Date: 12/15/2019 Duration: 23.39 minutes Patient history: 12 old female with syncope.  EEG evaluate for seizures. Level of alertness: Awake AEDs during EEG study: None Technical aspects: This EEG study was done with scalp electrodes positioned according to the 10-20 International system of electrode placement. Electrical activity was acquired at a sampling rate of 500Hz  and reviewed with a high frequency filter of 70Hz  and a low frequency filter  of 1Hz . EEG data were recorded continuously and digitally stored. Description: The posterior dominant rhythm consists of 8 Hz activity of moderate voltage (25-35 uV) seen predominantly in posterior head regions, symmetric and reactive to eye opening and eye closing. EEG showed intermittent generalized 3 to 6 Hz theta-delta slowing. Hyperventilation and photic stimulation were not performed.   ABNORMALITY -Intermittent slow, generalized IMPRESSION: This study is suggestive of mild diffuse encephalopathy, nonspecific etiology. No seizures or epileptiform discharges were seen throughout the recording. Lora Havens   ECHOCARDIOGRAM COMPLETE  Result Date: 12/14/2019    ECHOCARDIOGRAM REPORT   Patient Name:   JAMEELAH WATTS Date of Exam: 12/14/2019 Medical Rec #:  [id number]         Height:       64.5 in Accession #:    [id number]        Weight:       142.0 lb Date of Birth:  Jan 01, 1932         BSA:           1.700 m Patient Age:    75 years          BP:           111/66 mmHg Patient Gender: F                 HR:           101 bpm. Exam Location:  [facility] Procedure: 2D Echo, Cardiac Doppler and Color Doppler Indications:    CHF  History:        Patient has prior history of Echocardiogram examinations, most                 recent 04/04/2019. CHF, Arrythmias:Atrial Fibrillation,                 Signs/Symptoms:Dyspnea; Risk Factors:Hypertension, Dyslipidemia                 and Dementia.  Sonographer:    Dustin Flock RDCS Referring Phys: [phone] DAVID TAT IMPRESSIONS  1. Left ventricular ejection fraction, by estimation, is 70 to 75%. The left ventricle has hyperdynamic function. The left ventricle has no regional wall motion abnormalities. Left ventricular diastolic parameters are consistent with Grade II diastolic dysfunction (pseudonormalization). Elevated left atrial pressure.  2. Right ventricular systolic function is normal. The right ventricular size is normal. There is severely elevated pulmonary artery systolic pressure. The estimated right ventricular systolic pressure is 97.9 mmHg.  3. Left atrial size was moderately dilated.  4. Right atrial size was mildly dilated.  5. The mitral valve is degenerative. Trivial mitral valve regurgitation. Moderate mitral stenosis. The mean mitral valve gradient is 8.0 mmHg.  6. Tricuspid valve regurgitation is severe.  7. The aortic valve is tricuspid. Aortic valve regurgitation is not visualized. Mild to moderate aortic valve sclerosis/calcification is present, without any evidence of aortic stenosis. Aortic valve mean gradient measures 7.0 mmHg. Aortic valve Vmax measures 1.76 m/s.  8. The inferior vena cava is normal in size with <50% respiratory variability, suggesting right atrial pressure of 8 mmHg. Comparison(s): No significant change from prior study. Prior images reviewed side by side. FINDINGS  Left Ventricle: Left ventricular ejection fraction, by  estimation, is 70 to 75%. The left ventricle has hyperdynamic function. The left ventricle has no regional wall motion abnormalities. The left ventricular internal cavity size was normal in size. There is no left ventricular hypertrophy. Left ventricular diastolic parameters are consistent with Grade II  diastolic dysfunction (pseudonormalization). Elevated left atrial pressure. Right Ventricle: The right ventricular size is normal. No increase in right ventricular wall thickness. Right ventricular systolic function is normal. There is severely elevated pulmonary artery systolic pressure. The tricuspid regurgitant velocity is 4.21 m/s, and with an assumed right atrial pressure of 3 mmHg, the estimated right ventricular systolic pressure is 23.5 mmHg. Left Atrium: Left atrial size was moderately dilated. Right Atrium: Right atrial size was mildly dilated. Pericardium: There is no evidence of pericardial effusion. Mitral Valve: The mitral valve is degenerative in appearance. There is severe thickening of the mitral valve leaflet(s). There is severe calcification of the mitral valve leaflet(s). Normal mobility of the mitral valve leaflets. Severe mitral annular calcification. Trivial mitral valve regurgitation. Moderate mitral valve stenosis. MV peak gradient, 17.6 mmHg. The mean mitral valve gradient is 8.0 mmHg. Tricuspid Valve: The tricuspid valve is normal in structure. Tricuspid valve regurgitation is severe. No evidence of tricuspid stenosis. Aortic Valve: The aortic valve is tricuspid. Aortic valve regurgitation is not visualized. Mild to moderate aortic valve sclerosis/calcification is present, without any evidence of aortic stenosis. Aortic valve mean gradient measures 7.0 mmHg. Aortic valve peak gradient measures 12.4 mmHg. Aortic valve area, by VTI measures 2.21 cm. Pulmonic Valve: The pulmonic valve was normal in structure. Pulmonic valve regurgitation is mild. No evidence of pulmonic stenosis. Aorta: The  aortic root is normal in size and structure. Venous: The inferior vena cava is normal in size with less than 50% respiratory variability, suggesting right atrial pressure of 8 mmHg. IAS/Shunts: No atrial level shunt detected by color flow Doppler.  LEFT VENTRICLE PLAX 2D LVIDd:         3.29 cm  Diastology LVIDs:         2.34 cm  LV e' lateral:   7.10 cm/s LV PW:         1.06 cm  LV E/e' lateral: 25.5 LV IVS:        0.96 cm  LV e' medial:    3.42 cm/s LVOT diam:     2.00 cm  LV E/e' medial:  52.9 LV SV:         74 LV SV Index:   44 LVOT Area:     3.14 cm  RIGHT VENTRICLE RV Basal diam:  3.47 cm RV S prime:     5.98 cm/s TAPSE (M-mode): 2.1 cm LEFT ATRIUM             Index       RIGHT ATRIUM           Index LA diam:        5.40 cm 3.18 cm/m  RA Area:     19.10 cm LA Vol (A2C):   77.1 ml 45.34 ml/m RA Volume:   53.00 ml  31.17 ml/m LA Vol (A4C):   79.3 ml 46.64 ml/m LA Biplane Vol: 82.6 ml 48.58 ml/m  AORTIC VALVE AV Area (Vmax):    1.77 cm AV Area (Vmean):   1.79 cm AV Area (VTI):     2.21 cm AV Vmax:           176.00 cm/s AV Vmean:          121.000 cm/s AV VTI:            0.336 m AV Peak Grad:      12.4 mmHg AV Mean Grad:      7.0 mmHg LVOT Vmax:         98.90  cm/s LVOT Vmean:        68.900 cm/s LVOT VTI:          0.236 m LVOT/AV VTI ratio: 0.70  AORTA Ao Root diam: 2.90 cm MITRAL VALVE                TRICUSPID VALVE MV Area (PHT): 3.42 cm     TR Peak grad:   70.9 mmHg MV Peak grad:  17.6 mmHg    TR Vmax:        421.00 cm/s MV Mean grad:  8.0 mmHg MV Vmax:       2.10 m/s     SHUNTS MV Vmean:      128.5 cm/s   Systemic VTI:  0.24 m MV Decel Time: 222 msec     Systemic Diam: 2.00 cm MV E velocity: 181.00 cm/s MV A velocity: 66.30 cm/s MV E/A ratio:  2.73 Candee Furbish MD Electronically signed by Candee Furbish MD Signature Date/Time: 12/14/2019/12:34:52 PM    Final    US THORACENTESIS ASP PLEURAL SPACE W/IMG GUIDE  Result Date: 12/15/2019 INDICATION: Recurrent RIGHT pleural effusion EXAM: ULTRASOUND GUIDED  DIAGNOSTIC AND THERAPEUTIC RIGHT THORACENTESIS MEDICATIONS: None. COMPLICATIONS: None immediate. PROCEDURE: An ultrasound guided thoracentesis was thoroughly discussed with the patient and questions answered. The benefits, risks, alternatives and complications were also discussed. The patient understands and wishes to proceed with the procedure. Written consent was obtained. Ultrasound was performed to localize and mark an adequate pocket of fluid in the RIGHT chest. The area was then prepped and draped in the normal sterile fashion. 1% Lidocaine was used for local anesthesia. Under ultrasound guidance a 8 French thoracentesis catheter was introduced. Thoracentesis was performed. The catheter was removed and a dressing applied. FINDINGS: A total of approximately 1 L of amber colored RIGHT pleural fluid was removed. Samples were sent to the laboratory as requested by the clinical team. IMPRESSION: Successful ultrasound guided RIGHT thoracentesis yielding 1 L of pleural fluid. Electronically Signed   By: Lavonia Dana M.D.   On: 12/15/2019 13:44    Assessment and Plan:   1.  Acute on chronic diastolic heart failure complicated by persistent atrial fibrillation, moderate mitral stenosis and severe tricuspid regurgitation with severe pulmonary hypertension.  She is status post right-sided thoracentesis and continues on IV Lasix with reasonable urine output and weight loss.  Renal function improving with diuresis, creatinine down from 2.09-1.25.  2.  Permanent atrial fibrillation.  She is currently on Cardizem CD and amiodarone.  I reviewed cardiac history over the last 6 months, she remains on amiodarone for heart rate control and has had difficulty with titration of AV nodal blockers due to low blood pressure.  Eliquis for stroke prophylaxis.  3.  Dementia, she has been on Namenda and Seroquel.  4.  Palliative care consultation noted.  Currently DNR.  Continue Eliquis, amiodarone, and Cardizem CD - atrial  fibrillation is reasonably controlled in terms of heart rate overall.  Continue Lasix with potassium supplement, fluid status improving and weight is down with improvement in renal function during this time.  Mitral stenosis and severe pulmonary hypertension complicate management of optimal fluid status and make her at risk for continued decompensations.  She is not a candidate for valve surgery or further invasive work-up at this point.  Agree with palliative care consultation, we will follow and assist with adjustments in diuretics as needed.  Signed, Rozann Lesches, MD  12/17/2019 9:30 AM

## 2019-12-17 NOTE — Care Management Important Message (Signed)
Important Message  Patient Details  Name: Kristy Mckinney MRN: [id number] Date of Birth: [birth year]   Medicare Important Message Given:  Yes     Tommy Medal 12/17/2019, 12:57 PM

## 2019-12-17 NOTE — Plan of Care (Signed)
  Problem: Nutrition: Goal: Adequate nutrition will be maintained Outcome: Adequate for Discharge   Problem: Coping: Goal: Level of anxiety will decrease Outcome: Adequate for Discharge

## 2019-12-17 NOTE — Progress Notes (Signed)
Physical Therapy Treatment Patient Details Name: Kristy Mckinney MRN: [id number] DOB: [birth year] Today's Date: 12/17/2019    History of Present Illness 86 year old female with a history of permanent atrial fibrillation, diastolic CHF, GERD, anxiety, dementia, hypertension presenting with a syncopal episode.  The patient states that she was going across her kitchen to put a big potato in the microwave and started feeling dizzy.  The next thing she remembered was waking up on the floor with the potato scattered over the floor.  The patient has denied any recent chest pain, fevers, chills, palpitations, nausea, vomiting, diarrhea, abdominal pain, dysuria.  She recently followed up with her cardiology office on 12/04/2019.  Her torsemide was increased to 20 mg twice daily from 20 mg daily.  She endorses compliance.  However, the patient is not aware of any type of fluid restriction.  She has noted increasing lower extremity edema and just feeling "tired" with ambulation.    PT Comments    Patient agreeable to participating in PT session today. Patient eager to get stronger so she can go home. Daughter present throughout session. Patient able to sit at edge of bed x7 minutes prior to ambulation. Patient able to ambulate further today with RW and 2 LPM O2 but did exhibit minor knee buckling and complaints of fatigue at end of ambulation distance. New 3in1 DME for home noted in room.    Follow Up Recommendations  Home health PT;Supervision for mobility/OOB;Supervision - Intermittent     Equipment Recommendations  3in1 (PT)    Recommendations for Other Services       Precautions / Restrictions Precautions Precautions: Fall Restrictions Weight Bearing Restrictions: No    Mobility  Bed Mobility Overal bed mobility: Needs Assistance Bed Mobility: Supine to Sit     Supine to sit: Min guard;Supervision        Transfers Overall transfer level: Needs assistance Equipment used: Rolling  walker (2 wheeled) Transfers: Sit to/from [facility] Sit to Stand: Min guard;Supervision Stand pivot transfers: Min guard       General transfer comment: verbal cues for sequencing of steps and placement of hands  Ambulation/Gait Ambulation/Gait assistance: Min guard Gait Distance (Feet): 40 Feet   Gait Pattern/deviations: Step-through pattern;Decreased step length - right;Decreased step length - left;Decreased stride length;Trunk flexed Gait velocity: decreased   General Gait Details: slow, labored gait with RW; limited by fatigue; generalized weakness; minimal knee buckling at end of ambulation session.   Stairs             Wheelchair Mobility    Modified Rankin (Stroke Patients Only)       Balance Overall balance assessment: Needs assistance Sitting-balance support: Bilateral upper extremity supported;Feet supported Sitting balance-Leahy Scale: Good     Standing balance support: Bilateral upper extremity supported;During functional activity Standing balance-Leahy Scale: Fair Standing balance comment: fair with RW                            Cognition Arousal/Alertness: Awake/alert Behavior During Therapy: WFL for tasks assessed/performed Overall Cognitive Status: Within Functional Limits for tasks assessed                                        Exercises      General Comments        Pertinent Vitals/Pain Pain Assessment: Faces Faces Pain Scale: Hurts a  little bit    Home Living                      Prior Function            PT Goals (current goals can now be found in the care plan section) Acute Rehab PT Goals Patient Stated Goal: Go home with HHPT. PT Goal Formulation: With patient/family Time For Goal Achievement: 12/29/19 Potential to Achieve Goals: Good Progress towards PT goals: Progressing toward goals    Frequency    Min 3X/week      PT Plan Current plan remains  appropriate    Co-evaluation              AM-PAC PT "6 Clicks" Mobility   Outcome Measure  Help needed turning from your back to your side while in a flat bed without using bedrails?: A Little Help needed moving from lying on your back to sitting on the side of a flat bed without using bedrails?: A Little Help needed moving to and from a bed to a chair (including a wheelchair)?: A Little Help needed standing up from a chair using your arms (e.g., wheelchair or bedside chair)?: A Little Help needed to walk in hospital room?: A Little Help needed climbing 3-5 steps with a railing? : A Lot 6 Click Score: 17    End of Session Equipment Utilized During Treatment: Gait belt Activity Tolerance: Patient limited by fatigue Patient left: in chair;with call bell/phone within reach;with family/visitor present Nurse Communication: Mobility status PT Visit Diagnosis: Unsteadiness on feet (R26.81);Other abnormalities of gait and mobility (R26.89);History of falling (Z91.81);Muscle weakness (generalized) (M62.81);Pain     Time: 1415-1443 PT Time Calculation (min) (ACUTE ONLY): 28 min  Charges:  $Gait Training: 8-22 mins $Therapeutic Activity: 8-22 mins                     Floria Raveling. Hartnett-Rands, MS, PT Per [place] [phone] 12/17/2019, 2:48 PM

## 2019-12-18 DIAGNOSIS — I472 Ventricular tachycardia: Secondary | ICD-10-CM

## 2019-12-18 DIAGNOSIS — I482 Chronic atrial fibrillation, unspecified: Secondary | ICD-10-CM

## 2019-12-18 LAB — BASIC METABOLIC PANEL
Anion gap: 10 (ref 5–15)
BUN: 25 mg/dL — ABNORMAL HIGH (ref 8–23)
CO2: 34 mmol/L — ABNORMAL HIGH (ref 22–32)
Calcium: 8.8 mg/dL — ABNORMAL LOW (ref 8.9–10.3)
Chloride: 94 mmol/L — ABNORMAL LOW (ref 98–111)
Creatinine, Ser: 1.3 mg/dL — ABNORMAL HIGH (ref 0.44–1.00)
GFR calc Af Amer: 43 mL/min — ABNORMAL LOW (ref >60)
GFR calc non Af Amer: 37 mL/min — ABNORMAL LOW (ref >60)
Glucose, Bld: 110 mg/dL — ABNORMAL HIGH (ref 70–99)
Potassium: 4.2 mmol/L (ref 3.5–5.1)
Sodium: 138 mmol/L (ref 135–145)

## 2019-12-18 LAB — MAGNESIUM: Magnesium: 2 mg/dL (ref 1.7–2.4)

## 2019-12-18 MED ORDER — METOPROLOL TARTRATE 50 MG PO TABS
25.0000 mg | ORAL_TABLET | Freq: Two times a day (BID) | ORAL | Status: DC
  Administered 2019-12-18 – 2019-12-22 (×9): 25 mg via ORAL
  Filled 2019-12-18 (×11): qty 1

## 2019-12-18 MED ORDER — METOPROLOL TARTRATE 5 MG/5ML IV SOLN
2.5000 mg | Freq: Once | INTRAVENOUS | Status: AC
  Administered 2019-12-18: 2.5 mg via INTRAVENOUS
  Filled 2019-12-18: qty 5

## 2019-12-18 MED ORDER — DILTIAZEM HCL ER COATED BEADS 180 MG PO CP24
180.0000 mg | ORAL_CAPSULE | Freq: Every day | ORAL | Status: DC
  Administered 2019-12-18 – 2019-12-23 (×6): 180 mg via ORAL
  Filled 2019-12-18 (×6): qty 1

## 2019-12-18 NOTE — TOC Initial Note (Signed)
Transition of Care [facility]) - Initial/Assessment Note    Patient Details  Name: Kristy Mckinney MRN: [id number] Date of Birth: [birth year]  Transition of Care [facility]) CM/SW Contact:    Natasha Bence, LCSW Phone Number: 12/18/2019, 3:54 PM  Clinical Narrative:                 Patient is a 86 year old female admitted for Atrial fibrillation with RVR , Hypomagnesemia, Atrial fibrillation , and Acute respiratory failure with hypoxia. Patient is agreeable to [facility] and has been accepted by [facility].    Expected Discharge Plan: [place] Barriers to Discharge: Continued Medical Work up   Patient Goals and CMS Choice Patient states their goals for this hospitalization and ongoing recovery are:: Return home with Home Health   Choice offered to / list presented to : Patient  Expected Discharge Plan and Services Expected Discharge Plan: [place] In-house Referral: Clinical Social Work Discharge Planning Services: Follow-up appt scheduled Post Acute Care Choice: Durable Medical Equipment, Home Health Living arrangements for the past 2 months: Single Family Home                 DME Arranged: 3-N-1 DME Agency: [employer] Date DME Agency Contacted: 12/17/19 Time DME Agency Contacted: [id number] Representative spoke with at DME Agency: Barbaraann Rondo            Prior Living Arrangements/Services Living arrangements for the past 2 months: [place] with:: Self Patient language and need for interpreter reviewed:: Yes Do you feel safe going back to the place where you live?: Yes      Need for Family Participation in Patient Care: Yes (Comment) Care giver support system in place?: Yes (comment) Current home services: DME (walker, oxygen) Criminal Activity/Legal Involvement Pertinent to Current Situation/Hospitalization: No - Comment as needed  Activities of Daily Living Home Assistive Devices/Equipment: Cane (specify quad or straight),  Walker (specify type), Eyeglasses, Shower chair without back ADL Screening (condition at time of admission) Patient's cognitive ability adequate to safely complete daily activities?: Yes Is the patient deaf or have difficulty hearing?: Yes Does the patient have difficulty seeing, even when wearing glasses/contacts?: No Does the patient have difficulty concentrating, remembering, or making decisions?: Yes Patient able to express need for assistance with ADLs?: Yes Does the patient have difficulty dressing or bathing?: No Independently performs ADLs?: Yes (appropriate for developmental age) Does the patient have difficulty walking or climbing stairs?: Yes Weakness of Legs: Both Weakness of Arms/Hands: None  Permission Sought/Granted                  Emotional Assessment Appearance:: Appears stated age Attitude/Demeanor/Rapport: Engaged Affect (typically observed): Accepting Orientation: : Oriented to Self, Oriented to Place, Oriented to  Time, Oriented to Situation Alcohol / Substance Use: Not Applicable    Admission diagnosis:  Acute exacerbation of CHF (congestive heart failure) (HCC) [I50.9] Acute respiratory failure with hypoxia (HCC) [V78.58] Systolic congestive heart failure, unspecified HF chronicity ([place]) [I50.20] Patient Active Problem List   Diagnosis Date Noted  . Status post thoracentesis   . DNR (do not resuscitate)   . Acute exacerbation of CHF (congestive heart failure) ([place]) 12/14/2019  . Syncope and collapse 12/14/2019  . Acute respiratory failure ([place]) 10/12/2019  . Elevated glucose 08/20/2019  . Degenerative lumbar disc 08/20/2019  . Left hip pain 08/20/2019  . Osteoporosis   . Anemia, unspecified   . Anxiety disorder, unspecified   .  Vitamin D deficiency, unspecified   . Essential (primary) hypertension   . Abdominal distension (gaseous)   . Disorder of the skin and subcutaneous tissue, unspecified   . Gastro-esophageal reflux disease with  esophagitis   . Iron deficiency anemia, unspecified   . Irritable bowel syndrome with diarrhea   . Left temporomandibular joint disorder, unspecified   . Unspecified dementia without behavioral disturbance ([place])   . Unspecified fracture of unspecified wrist and hand, initial encounter for closed fracture   . Advanced care planning/counseling discussion   . Acute on chronic diastolic congestive heart failure ([place]) 05/02/2019  . Acute on chronic diastolic CHF (congestive heart failure) ([place]) 05/02/2019  . Palliative care by specialist   . Goals of care, counseling/discussion   . Generalized weakness   . Pleural effusion 04/13/2019  . Pancreatitis, acute 04/13/2019  . Acute respiratory failure with hypoxia ([place]) 04/13/2019  . Bradycardia 04/10/2019  . Hypotension 04/10/2019  . Hyperkalemia 04/10/2019  . ARF (acute renal failure) ([place]) 04/10/2019  . Atrial fibrillation ([place]) 04/06/2019  . A-fib ([place]) 04/04/2019  . Dementia ([place]) 12/12/2018  . GERD without esophagitis 12/06/2018  . Post-menopausal osteoporosis 12/06/2018  . Acute delirium 12/06/2018  . Hypomagnesemia 12/06/2018  . Acute blood loss anemia 12/06/2018  . Closed fracture of right femur, unspecified fracture morphology, initial encounter ([place]) 11/28/2018  . Acute diastolic CHF (congestive heart failure) ([place]) 11/28/2018  . Fracture, intertrochanteric, right femur ([place]) 11/28/2018  . Atrial fibrillation with rapid ventricular response ([place]) 11/25/2018  . Atrial fibrillation with RVR ([place]) 11/24/2018  . Chronic back pain 11/24/2018  . Anxiety and depression 11/24/2018  . Chronic constipation 05/03/2016  . Dysphagia 05/03/2016  . Cholecystitis with cholelithiasis 01/29/2014   PCP:  Celene Squibb, MD Pharmacy:   [facility] [place] [address] [place] [address] Phone: [phone] Fax: [phone]  [facility] - [address] [place], [place] [address] Phone: [phone] Fax: [phone]     Social Determinants of Health (SDOH) Interventions    Readmission Risk Interventions Readmission Risk Prevention Plan 12/15/2019 09/12/2019 05/10/2019  Post Dischage Appt - - -  Appt Comments - - -  Medication Screening - - -  Transportation Screening Complete Complete Complete  Medication Review ([place]) Complete Complete Complete  PCP or Specialist appointment within 3-5 days of discharge Complete - Complete  HRI or Home Care Consult Complete Complete Complete  SW Recovery Care/Counseling Consult Complete Complete Complete  Palliative Care Screening Complete Not Applicable Complete  [place] Not Applicable Not Applicable Not Applicable  Some recent data might be hidden

## 2019-12-18 NOTE — Progress Notes (Signed)
Primary Cardiologist:  Domenic Polite  Subjective:  Lethargic and confused   Objective:  Vitals:   12/18/19 0338 12/18/19 0417 12/18/19 0420 12/18/19 0735  BP: 120/63 129/66  124/79  Pulse: (!) 106 91  (!) 105  Resp: 16 20  16   Temp: 98.3 F (36.8 C) 98.7 F (37.1 C)  98.1 F (36.7 C)  TempSrc: Oral Oral  Oral  SpO2: 91% 95%  93%  Weight:   60.9 kg     Intake/Output from previous day:  Intake/Output Summary (Last 24 hours) at 12/18/2019 9485 Last data filed at 12/18/2019 0600 Gross per 24 hour  Intake 240 ml  Output 400 ml  Net -160 ml    Physical Exam: Dementia Healthy:  appears stated age HEENT: normal Neck supple with no adenopathy JVP elevated l no bruits no thyromegaly Lungs clear with no wheezing and good diaphragmatic motion Heart:  S1/S2 SEM  murmur, no rub, gallop or click PMI normal Abdomen: benighn, BS positve, no tenderness, no AAA no bruit.  No HSM or HJR Distal pulses intact with no bruits Plus one bilateral edema Neuro non-focal Skin warm and dry No muscular weakness   Lab Results: Basic Metabolic Panel: Recent Labs    12/17/19 0843 12/18/19 0548  NA 136 138  K 3.6 4.2  CL 90* 94*  CO2 32 34*  GLUCOSE 164* 110*  BUN 24* 25*  CREATININE 1.25* 1.30*  CALCIUM 8.9 8.8*  MG  --  2.0    No results for input(s): TSH, T4TOTAL, T3FREE, THYROIDAB in the last 72 hours.  Invalid input(s): FREET3 Anemia Panel: No results for input(s): VITAMINB12, FOLATE, FERRITIN, TIBC, IRON, RETICCTPCT in the last 72 hours.  Imaging: No results found.  Cardiac Studies:  ECG: afib nonspecific ST changes    Telemetry: Afib rates around 90-105 bpm   Echo: IMPRESSIONS    1. Left ventricular ejection fraction, by estimation, is 70 to 75%. The  left ventricle has hyperdynamic function. The left ventricle has no  regional wall motion abnormalities. Left ventricular diastolic parameters  are consistent with Grade II diastolic  dysfunction  (pseudonormalization). Elevated left atrial pressure.  2. Right ventricular systolic function is normal. The right ventricular  size is normal. There is severely elevated pulmonary artery systolic  pressure. The estimated right ventricular systolic pressure is 46.2 mmHg.  3. Left atrial size was moderately dilated.  4. Right atrial size was mildly dilated.  5. The mitral valve is degenerative. Trivial mitral valve regurgitation.  Moderate mitral stenosis. The mean mitral valve gradient is 8.0 mmHg.  6. Tricuspid valve regurgitation is severe.  7. The aortic valve is tricuspid. Aortic valve regurgitation is not  visualized. Mild to moderate aortic valve sclerosis/calcification is  present, without any evidence of aortic stenosis. Aortic valve mean  gradient measures 7.0 mmHg. Aortic valve Vmax  measures 1.76 m/s.  8. The inferior vena cava is normal in size with <50% respiratory  variability, suggesting right atrial pressure of 8 mmHg.   Medications:   . amiodarone  200 mg Oral Daily  . apixaban  5 mg Oral BID  . diltiazem  120 mg Oral Daily  . furosemide  40 mg Intravenous BID  . memantine  5 mg Oral BID  . potassium chloride  40 mEq Oral Daily  . QUEtiapine  25 mg Oral QHS  . sertraline  50 mg Oral QHS  . sodium chloride flush  3 mL Intravenous Q12H     . sodium chloride  Assessment/Plan:    1. Afib:  Chronic not clear why she is on amiodarone with mitral stenosis HR control important to maximize diastolic filling time D/c amiodarone start beta blocker and increase cardizem continue eliquis   2. Right heart failure:  With pulmonary HTN functional mitral stenosis not a candidate for surgical correction or invasive evaluation given age and dementia continue diuretic   Jenkins Rouge 12/18/2019, 8:07 AM

## 2019-12-18 NOTE — Progress Notes (Signed)
Palliative:  HPI: 86 y.o.femalewith past medical history of atrial fibrillation, diastolic CHF, GERD, anxiety, dementia, hypertensionadmitted on 6/5/2021with syncopal episode related to acute on chronic diastolic CHF.   I met today with Ms. Banghart and her daughter, [name], at bedside. Ms. Duchesneau is tired today and had issues with heart rate overnight with VT runs and afib - cardiology following for recommendations. We continued our conversation regarding her goals moving forward. She is currently torn as she is not pleased with her current quality of life but is not ready to leave her family. She has peace that her life is limited and talks openly about dying and being okay with this. However, she is struggling to make decisions to focus on comfort over rehospitalization and continued aggressive care to prolong life even though at times this is what she wants as well. She continues to be torn with her decision and they continue to discuss this as a family.   All questions/concerns addressed. Emotional support provided.   Exam: Alert, oriented. Fatigued. No distress. Breathing regular, unlabored. Abd flat. Generalized weakness.   Plan: - DNR decided this admission.  - No changes to goals at this stage. Continue with outpatient palliative care at home.   [place], NP Palliative Medicine Team Pager [phone] (Please see [email or web address] for schedule) Team Phone [phone]    Greater than 50%  of this time was spent counseling and coordinating care related to the above assessment and plan

## 2019-12-18 NOTE — Progress Notes (Signed)
Notified by telemetry that patient had 6 beat run of VTach. Vital signs obtained, Heart rate ranging from 100-115 bpm, Afib. MD notified. New order placed for metoprolol 2.5 mg IV once given.

## 2019-12-18 NOTE — Progress Notes (Signed)
[facility] night shift.  The staff reported that the patient had a 6 beat run nonsustained VT.  Her heart rate at the moment was 100 bpm with atrial fibrillation rhythm.  Patient was given 2.5 mg of metoprolol preemptively.  Magnesium level was added to her morning labs.  She is on daily potassium supplement.  Tennis Must, MD

## 2019-12-18 NOTE — Progress Notes (Addendum)
PROGRESS NOTE  Kristy Mckinney KGM:[id number] DOB: 02-Dec-1931 DOA: 12/13/2019 PCP: Celene Squibb, MD   Brief History:  86 year old female with a history of permanent atrial fibrillation, diastolic CHF, GERD, anxiety, dementia, hypertension presenting with a syncopal episode.  The patient states that she was going across her kitchen to put a big potato in the microwave and started feeling dizzy.  The next thing she remembered was waking up on the floor with the potato scattered over the floor.  The patient has denied any recent chest pain, fevers, chills, palpitations, nausea, vomiting, diarrhea, abdominal pain, dysuria.  She recently followed up with her cardiology office on 12/04/2019.  Her torsemide was increased to 20 mg twice daily from 20 mg daily.  She endorses compliance.  However, the patient is not aware of any type of fluid restriction.  She has noted increasing lower extremity edema and just feeling "tired" with ambulation. In the emergency department, the patient was noted to have oxygen saturation of 78% on room air.  She was afebrile hemodynamically stable.  She was placed on supplemental oxygen with oxygen saturation up to 97%.  BMP showed a potassium 3.4 with unremarkable LFTs.  WBC 8.4, hemoglobin 9.4, platelets 304,000.  BNP was 902.  Troponins were unremarkable.  CT of the brain and C-spine were unremarkable.  Chest x-ray showed bilateral pleural effusions, right greater than left.  There is increased interstitial markings.  The patient was started IV furosemide.   Assessment/Plan: Acute respiratory failure with hypoxia -Secondary to pulmonary edema pleural effusion -Currently on 2 to 3 L nasal cannula, but has increased work of breathing -Wean oxygen for saturation greater 92% -Personally reviewed chest x-ray--bilateral pleural effusions, right greater than left.  Increased interstitial markings   Acute on chronic diastolic CHF, right-sided heart failure with severe  pulmonary hypertension -continue IV furosemide 40 mg IV twice daily -The patient remains clinically fluid overloaded, needs continued diuresis -Renal function slowly improving with diuresis -pt endorses some indiscretion with fluid intake at home -Accurate I's and Os--incomplete -Daily weights -cardiology consult -Patient states that last weight at home was 150.4 pounds on 12/13/2019 -6/7 R-side thoracocentesis--1L removed  Acute kidney injury on chronic kidney disease stage IIIa -Likely precipitated by CHF low output -Creatinine improving with diuresis -Continue to monitor renal function.   Syncope -Echo--EF 70-75%, grade 2DD, severe TR, severe elevated PASP -remain on tele -orthostatics -EEG--mild diffuse encephalopathy   Permanent atrial fibrillation -Rate controlled -Continue diltiazem CD -Amiodarone discontinued per cardiology -Metoprolol dosing adjusted for better rate control -Continue apixaban   Cognitive impairment/dementia without behavioral disturbance -Continue Namenda and Seroquel -Continue sertraline   Hypokalemia -Replete -Check magnesium--2.3   Goals of care -palliative medicine consult -frequent hospitalizations -changed to DNR -Palliative care follow-up as an outpatient -Discussed further goals of care with patient's daughter who will assist with further conversations with the patient -Emphasized importance of quality of life which the daughter agrees with.         Status is: Inpatient   The patient will require care spanning > 2 midnights and should be moved to inpatient because: IV treatments appropriate due to intensity of illness or inability to take PO  Remains fluid overloaded, requires IV lasix   Dispo: The patient is from: Home              Anticipated d/c is to: Home              Anticipated  d/c date is: 1-2 days              Patient currently is not medically stable to d/c.   Family Communication:   Discussed with daughter, [name]  over the phone   Consultants: Cardiology, palliative care   Code Status:  FULL   DVT Prophylaxis: apixaban     Procedures: As Listed in Progress Note Above   Antibiotics: None     Subjective: Complains of feeling weak.  Still short of breath as difficulty completing a sentence.  Objective: Vitals:   12/18/19 0420 12/18/19 0735 12/18/19 1246 12/18/19 1359  BP:  124/79 (!) 104/46   Pulse:  (!) 105 (!) 109 93  Resp:  16 16 18   Temp:  98.1 F (36.7 C) 97.6 F (36.4 C)   TempSrc:  Oral Oral   SpO2:  93% (!) 87% 95%  Weight: 60.9 kg       Intake/Output Summary (Last 24 hours) at 12/18/2019 1850 Last data filed at 12/18/2019 1700 Gross per 24 hour  Intake 480 ml  Output 1300 ml  Net -820 ml   Weight change:  Exam:  General exam: Alert, awake, oriented x 3 Respiratory system: crackles at bases. Increased respiratory effort. Cardiovascular system:RRR. No murmurs, rubs, gallops. Gastrointestinal system: Abdomen is nondistended, soft and nontender. No organomegaly or masses felt. Normal bowel sounds heard. Central nervous system: Alert and oriented. No focal neurological deficits. Extremities: 1+ edema bilaterally Skin: No rashes, lesions or ulcers Psychiatry: Judgement and insight appear normal. Mood & affect appropriate.    Data Reviewed: I have personally reviewed following labs and imaging studies Basic Metabolic Panel: Recent Labs  Lab 12/14/19 0904 12/15/19 0549 12/16/19 0549 12/17/19 0843 12/18/19 0548  NA 141 139 137 136 138  K 3.6 3.3* 3.0* 3.6 4.2  CL 96* 94* 91* 90* 94*  CO2 32 31 35* 32 34*  GLUCOSE 132* 116* 121* 164* 110*  BUN 43* 39* 31* 24* 25*  CREATININE 2.09* 1.89* 1.42* 1.25* 1.30*  CALCIUM 8.9 9.1 8.5* 8.9 8.8*  MG  --  2.3  --   --  2.0   Liver Function Tests: Recent Labs  Lab 12/13/19 2152  AST 37  ALT 32  ALKPHOS 127*  BILITOT 1.1  PROT 7.1  ALBUMIN 3.9   No results for input(s): LIPASE, AMYLASE in the last 168 hours. No  results for input(s): AMMONIA in the last 168 hours. Coagulation Profile: No results for input(s): INR, PROTIME in the last 168 hours. CBC: Recent Labs  Lab 12/13/19 2152  WBC 8.4  NEUTROABS 6.3  HGB 9.4*  HCT 32.5*  MCV 78.9*  PLT 304   Cardiac Enzymes: No results for input(s): CKTOTAL, CKMB, CKMBINDEX, TROPONINI in the last 168 hours. BNP: Invalid input(s): POCBNP CBG: No results for input(s): GLUCAP in the last 168 hours. HbA1C: No results for input(s): HGBA1C in the last 72 hours. Urine analysis:    Component Value Date/Time   COLORURINE YELLOW 09/10/2019 1248   APPEARANCEUR CLEAR 09/10/2019 1248   LABSPEC 1.018 09/10/2019 1248   PHURINE 6.0 09/10/2019 1248   GLUCOSEU NEGATIVE 09/10/2019 1248   HGBUR NEGATIVE 09/10/2019 1248   BILIRUBINUR NEGATIVE 09/10/2019 1248   [place] 09/10/2019 1248   PROTEINUR 100 (A) 09/10/2019 1248   NITRITE NEGATIVE 09/10/2019 1248   LEUKOCYTESUR NEGATIVE 09/10/2019 1248   Sepsis Labs: @LABRCNTIP (procalcitonin:4,lacticidven:4) ) Recent Results (from the past 240 hour(s))  SARS Coronavirus 2 by RT PCR (hospital order, performed in [place]  hospital lab) Nasopharyngeal Nasopharyngeal Swab     Status: None   Collection Time: 12/13/19 10:01 PM   Specimen: Nasopharyngeal Swab  Result Value Ref Range Status   SARS Coronavirus 2 NEGATIVE NEGATIVE Final    Comment: (NOTE) SARS-CoV-2 target nucleic acids are NOT DETECTED. The SARS-CoV-2 RNA is generally detectable in upper and lower respiratory specimens during the acute phase of infection. The lowest concentration of SARS-CoV-2 viral copies this assay can detect is 250 copies / mL. A negative result does not preclude SARS-CoV-2 infection and should not be used as the sole basis for treatment or other patient management decisions.  A negative result may occur with improper specimen collection / handling, submission of specimen other than nasopharyngeal swab, presence of viral  mutation(s) within the areas targeted by this assay, and inadequate number of viral copies (<250 copies / mL). A negative result must be combined with clinical observations, patient history, and epidemiological information. Fact Sheet for Patients:   StrictlyIdeas.no Fact Sheet for Healthcare Providers: BankingDealers.co.za This test is not yet approved or cleared  by the [place] FDA and has been authorized for detection and/or diagnosis of SARS-CoV-2 by FDA under an Emergency Use Authorization (EUA).  This EUA will remain in effect (meaning this test can be used) for the duration of the COVID-19 declaration under Section 564(b)(1) of the Act, 21 U.S.C. section 360bbb-3(b)(1), unless the authorization is terminated or revoked sooner. Performed at [facility], [address] [place], [place] [address]   Culture, body fluid-bottle     Status: None (Preliminary result)   Collection Time: 12/15/19  1:05 PM   Specimen: Fluid  Result Value Ref Range Status   Specimen Description FLUID PLEURAL COLLECTED BY DOCTOR  Final   Special Requests BOTTLES DRAWN AEROBIC AND ANAEROBIC 10CC EACH  Final   Culture   Final    NO GROWTH 3 DAYS Performed at [facility], [address], [place], [place] [address]    Report Status PENDING  Incomplete  Gram stain     Status: None   Collection Time: 12/15/19  1:05 PM   Specimen: Pleura  Result Value Ref Range Status   Specimen Description PLEURAL  Final   Special Requests PLEURAL  Final   Gram Stain   Final    CYTOSPIN SMEAR WBC PRESENT, PREDOMINANTLY MONONUCLEAR NO ORGANISMS SEEN Performed at [facility] Performed at [facility], [address], [place], [place] [address]    Report Status 12/15/2019 FINAL  Final     Scheduled Meds:  apixaban  5 mg Oral BID   diltiazem  180 mg Oral Daily   furosemide  40 mg Intravenous BID   memantine  5 mg Oral BID   metoprolol tartrate  25 mg Oral BID     potassium chloride  40 mEq Oral Daily   QUEtiapine  25 mg Oral QHS   sertraline  50 mg Oral QHS   sodium chloride flush  3 mL Intravenous Q12H   Continuous Infusions:  sodium chloride      Procedures/Studies: DG Chest 1 View  Result Date: 12/15/2019 CLINICAL DATA:  Post RIGHT thoracentesis EXAM: CHEST  1 VIEW COMPARISON:  12/13/2019 FINDINGS: Enlargement of cardiac silhouette with mitral annular calcification. Atherosclerotic calcification aorta. Pulmonary vascular congestion. Small bibasilar pleural effusions and atelectasis decreased on RIGHT since prior exam post thoracentesis. Mild perihilar edema. No pneumothorax. Bones demineralized. IMPRESSION: No pneumothorax following RIGHT thoracentesis. Enlargement of cardiac silhouette with pulmonary vascular congestion and mild pulmonary edema.  Bibasilar effusions and atelectasis decreased on RIGHT since prior study Electronically Signed   By: Lavonia Dana M.D.   On: 12/15/2019 13:32   CT Head Wo Contrast  Result Date: 12/13/2019 CLINICAL DATA:  [place], back pain EXAM: CT HEAD WITHOUT CONTRAST CT CERVICAL SPINE WITHOUT CONTRAST TECHNIQUE: Multidetector CT imaging of the head and cervical spine was performed following the standard protocol without intravenous contrast. Multiplanar CT image reconstructions of the cervical spine were also generated. COMPARISON:  11/28/2018 FINDINGS: CT HEAD FINDINGS Brain: Chronic small vessel ischemic changes are seen within the periventricular white matter. No acute infarct or hemorrhage. Lateral ventricles and midline structures are unremarkable. No acute extra-axial fluid collections. No mass effect. Vascular: No hyperdense vessel or unexpected calcification. Skull: There is a small ex of little scalp hematoma. No underlying fracture. Remainder of the calvarium is unremarkable. Sinuses/Orbits: No acute finding. Other: None. CT CERVICAL SPINE FINDINGS Alignment: Alignment is grossly anatomic. Skull base and vertebrae: No  acute displaced fractures. Soft tissues and spinal canal: No prevertebral fluid or swelling. No visible canal hematoma. Disc levels: Disc space narrowing and osteophyte formation is seen from C3-4 through C6-7, most pronounced at the C5-6 level. At C3-4 there is minimal symmetrical neural foraminal encroachment. At C4-5 there is mild central stenosis with bilateral right greater than left neural foraminal encroachment. At C5-6 there is at least moderate to severe central canal stenosis with significant right greater than left neural foraminal encroachment. At C6-7 there is mild central stenosis with severe right greater than left neural foraminal encroachment. Upper chest: Airway is patent. Large right pleural effusion partially visualized. Left apex is clear. Other: Reconstructed images demonstrate no additional findings. IMPRESSION: 1. No acute intracranial process. 2. No acute cervical spine fracture. 3. Significant multilevel cervical spondylosis, most pronounced at C5-6. Significant but less severe changes are seen at C4-5 and C6-7 as above. 4. Large right pleural effusion. Electronically Signed   By: Randa Ngo M.D.   On: 12/13/2019 23:22   CT Cervical Spine Wo Contrast  Result Date: 12/13/2019 CLINICAL DATA:  [place], back pain EXAM: CT HEAD WITHOUT CONTRAST CT CERVICAL SPINE WITHOUT CONTRAST TECHNIQUE: Multidetector CT imaging of the head and cervical spine was performed following the standard protocol without intravenous contrast. Multiplanar CT image reconstructions of the cervical spine were also generated. COMPARISON:  11/28/2018 FINDINGS: CT HEAD FINDINGS Brain: Chronic small vessel ischemic changes are seen within the periventricular white matter. No acute infarct or hemorrhage. Lateral ventricles and midline structures are unremarkable. No acute extra-axial fluid collections. No mass effect. Vascular: No hyperdense vessel or unexpected calcification. Skull: There is a small ex of little scalp  hematoma. No underlying fracture. Remainder of the calvarium is unremarkable. Sinuses/Orbits: No acute finding. Other: None. CT CERVICAL SPINE FINDINGS Alignment: Alignment is grossly anatomic. Skull base and vertebrae: No acute displaced fractures. Soft tissues and spinal canal: No prevertebral fluid or swelling. No visible canal hematoma. Disc levels: Disc space narrowing and osteophyte formation is seen from C3-4 through C6-7, most pronounced at the C5-6 level. At C3-4 there is minimal symmetrical neural foraminal encroachment. At C4-5 there is mild central stenosis with bilateral right greater than left neural foraminal encroachment. At C5-6 there is at least moderate to severe central canal stenosis with significant right greater than left neural foraminal encroachment. At C6-7 there is mild central stenosis with severe right greater than left neural foraminal encroachment. Upper chest: Airway is patent. Large right pleural effusion partially visualized. Left apex is clear. Other:  Reconstructed images demonstrate no additional findings. IMPRESSION: 1. No acute intracranial process. 2. No acute cervical spine fracture. 3. Significant multilevel cervical spondylosis, most pronounced at C5-6. Significant but less severe changes are seen at C4-5 and C6-7 as above. 4. Large right pleural effusion. Electronically Signed   By: Randa Ngo M.D.   On: 12/13/2019 23:22   DG Pelvis Portable  Result Date: 12/13/2019 CLINICAL DATA:  [place], back pain EXAM: PORTABLE PELVIS 1-2 VIEWS COMPARISON:  None. FINDINGS: Single frontal view of the pelvis demonstrates postsurgical changes right hip. No acute displaced fracture. Left convex scoliosis lumbar spine. Sacroiliac joints are normal. Soft tissues are unremarkable. IMPRESSION: 1. No acute displaced fracture. Electronically Signed   By: Randa Ngo M.D.   On: 12/13/2019 22:46   DG Chest Portable 1 View  Result Date: 12/13/2019 CLINICAL DATA:  [place], pain EXAM: PORTABLE  CHEST 1 VIEW COMPARISON:  10/13/2019 FINDINGS: Single frontal view of the chest demonstrates an enlarged right pleural effusion with right basilar atelectasis. Left basilar consolidation unchanged. No pneumothorax. Cardiac silhouette is stable. Atherosclerosis aortic arch. No acute displaced fractures. IMPRESSION: 1. Enlarging right pleural effusion. 2. Persistent bibasilar areas of consolidation likely atelectasis, increased on the right since prior study. Electronically Signed   By: Randa Ngo M.D.   On: 12/13/2019 22:43   EEG adult  Result Date: 12/15/2019 Lora Havens, MD     12/15/2019  2:20 PM Patient Name: Kristy Mckinney MRN: [id number] Epilepsy Attending: Lora Havens Referring Physician/Provider: Dr. Shanon Brow Tat Date: 12/15/2019 Duration: 23.39 minutes Patient history: 4 old female with syncope.  EEG evaluate for seizures. Level of alertness: Awake AEDs during EEG study: None Technical aspects: This EEG study was done with scalp electrodes positioned according to the 10-20 International system of electrode placement. Electrical activity was acquired at a sampling rate of 500Hz  and reviewed with a high frequency filter of 70Hz  and a low frequency filter of 1Hz . EEG data were recorded continuously and digitally stored. Description: The posterior dominant rhythm consists of 8 Hz activity of moderate voltage (25-35 uV) seen predominantly in posterior head regions, symmetric and reactive to eye opening and eye closing. EEG showed intermittent generalized 3 to 6 Hz theta-delta slowing. Hyperventilation and photic stimulation were not performed.   ABNORMALITY -Intermittent slow, generalized IMPRESSION: This study is suggestive of mild diffuse encephalopathy, nonspecific etiology. No seizures or epileptiform discharges were seen throughout the recording. Lora Havens   ECHOCARDIOGRAM COMPLETE  Result Date: 12/14/2019    ECHOCARDIOGRAM REPORT   Patient Name:   Kristy Mckinney Date of Exam:  12/14/2019 Medical Rec #:  [id number]         Height:       64.5 in Accession #:    [id number]        Weight:       142.0 lb Date of Birth:  October 24, 1931         BSA:          1.700 m Patient Age:    89 years          BP:           111/66 mmHg Patient Gender: F                 HR:           101 bpm. Exam Location:  [facility] Procedure: 2D Echo, Cardiac Doppler and Color Doppler Indications:    CHF  History:  Patient has prior history of Echocardiogram examinations, most                 recent 04/04/2019. CHF, Arrythmias:Atrial Fibrillation,                 Signs/Symptoms:Dyspnea; Risk Factors:Hypertension, Dyslipidemia                 and Dementia.  Sonographer:    Dustin Flock RDCS Referring Phys: [phone] DAVID TAT IMPRESSIONS  1. Left ventricular ejection fraction, by estimation, is 70 to 75%. The left ventricle has hyperdynamic function. The left ventricle has no regional wall motion abnormalities. Left ventricular diastolic parameters are consistent with Grade II diastolic dysfunction (pseudonormalization). Elevated left atrial pressure.  2. Right ventricular systolic function is normal. The right ventricular size is normal. There is severely elevated pulmonary artery systolic pressure. The estimated right ventricular systolic pressure is 57.8 mmHg.  3. Left atrial size was moderately dilated.  4. Right atrial size was mildly dilated.  5. The mitral valve is degenerative. Trivial mitral valve regurgitation. Moderate mitral stenosis. The mean mitral valve gradient is 8.0 mmHg.  6. Tricuspid valve regurgitation is severe.  7. The aortic valve is tricuspid. Aortic valve regurgitation is not visualized. Mild to moderate aortic valve sclerosis/calcification is present, without any evidence of aortic stenosis. Aortic valve mean gradient measures 7.0 mmHg. Aortic valve Vmax measures 1.76 m/s.  8. The inferior vena cava is normal in size with <50% respiratory variability, suggesting right atrial pressure of 8 mmHg.  Comparison(s): No significant change from prior study. Prior images reviewed side by side. FINDINGS  Left Ventricle: Left ventricular ejection fraction, by estimation, is 70 to 75%. The left ventricle has hyperdynamic function. The left ventricle has no regional wall motion abnormalities. The left ventricular internal cavity size was normal in size. There is no left ventricular hypertrophy. Left ventricular diastolic parameters are consistent with Grade II diastolic dysfunction (pseudonormalization). Elevated left atrial pressure. Right Ventricle: The right ventricular size is normal. No increase in right ventricular wall thickness. Right ventricular systolic function is normal. There is severely elevated pulmonary artery systolic pressure. The tricuspid regurgitant velocity is 4.21 m/s, and with an assumed right atrial pressure of 3 mmHg, the estimated right ventricular systolic pressure is 46.9 mmHg. Left Atrium: Left atrial size was moderately dilated. Right Atrium: Right atrial size was mildly dilated. Pericardium: There is no evidence of pericardial effusion. Mitral Valve: The mitral valve is degenerative in appearance. There is severe thickening of the mitral valve leaflet(s). There is severe calcification of the mitral valve leaflet(s). Normal mobility of the mitral valve leaflets. Severe mitral annular calcification. Trivial mitral valve regurgitation. Moderate mitral valve stenosis. MV peak gradient, 17.6 mmHg. The mean mitral valve gradient is 8.0 mmHg. Tricuspid Valve: The tricuspid valve is normal in structure. Tricuspid valve regurgitation is severe. No evidence of tricuspid stenosis. Aortic Valve: The aortic valve is tricuspid. Aortic valve regurgitation is not visualized. Mild to moderate aortic valve sclerosis/calcification is present, without any evidence of aortic stenosis. Aortic valve mean gradient measures 7.0 mmHg. Aortic valve peak gradient measures 12.4 mmHg. Aortic valve area, by VTI  measures 2.21 cm. Pulmonic Valve: The pulmonic valve was normal in structure. Pulmonic valve regurgitation is mild. No evidence of pulmonic stenosis. Aorta: The aortic root is normal in size and structure. Venous: The inferior vena cava is normal in size with less than 50% respiratory variability, suggesting right atrial pressure of 8 mmHg. IAS/Shunts: No atrial level shunt detected  by color flow Doppler.  LEFT VENTRICLE PLAX 2D LVIDd:         3.29 cm  Diastology LVIDs:         2.34 cm  LV e' lateral:   7.10 cm/s LV PW:         1.06 cm  LV E/e' lateral: 25.5 LV IVS:        0.96 cm  LV e' medial:    3.42 cm/s LVOT diam:     2.00 cm  LV E/e' medial:  52.9 LV SV:         74 LV SV Index:   44 LVOT Area:     3.14 cm  RIGHT VENTRICLE RV Basal diam:  3.47 cm RV S prime:     5.98 cm/s TAPSE (M-mode): 2.1 cm LEFT ATRIUM             Index       RIGHT ATRIUM           Index LA diam:        5.40 cm 3.18 cm/m  RA Area:     19.10 cm LA Vol (A2C):   77.1 ml 45.34 ml/m RA Volume:   53.00 ml  31.17 ml/m LA Vol (A4C):   79.3 ml 46.64 ml/m LA Biplane Vol: 82.6 ml 48.58 ml/m  AORTIC VALVE AV Area (Vmax):    1.77 cm AV Area (Vmean):   1.79 cm AV Area (VTI):     2.21 cm AV Vmax:           176.00 cm/s AV Vmean:          121.000 cm/s AV VTI:            0.336 m AV Peak Grad:      12.4 mmHg AV Mean Grad:      7.0 mmHg LVOT Vmax:         98.90 cm/s LVOT Vmean:        68.900 cm/s LVOT VTI:          0.236 m LVOT/AV VTI ratio: 0.70  AORTA Ao Root diam: 2.90 cm MITRAL VALVE                TRICUSPID VALVE MV Area (PHT): 3.42 cm     TR Peak grad:   70.9 mmHg MV Peak grad:  17.6 mmHg    TR Vmax:        421.00 cm/s MV Mean grad:  8.0 mmHg MV Vmax:       2.10 m/s     SHUNTS MV Vmean:      128.5 cm/s   Systemic VTI:  0.24 m MV Decel Time: 222 msec     Systemic Diam: 2.00 cm MV E velocity: 181.00 cm/s MV A velocity: 66.30 cm/s MV E/A ratio:  2.73 Candee Furbish MD Electronically signed by Candee Furbish MD Signature Date/Time: 12/14/2019/12:34:52 PM     Final    US THORACENTESIS ASP PLEURAL SPACE W/IMG GUIDE  Result Date: 12/15/2019 INDICATION: Recurrent RIGHT pleural effusion EXAM: ULTRASOUND GUIDED DIAGNOSTIC AND THERAPEUTIC RIGHT THORACENTESIS MEDICATIONS: None. COMPLICATIONS: None immediate. PROCEDURE: An ultrasound guided thoracentesis was thoroughly discussed with the patient and questions answered. The benefits, risks, alternatives and complications were also discussed. The patient understands and wishes to proceed with the procedure. Written consent was obtained. Ultrasound was performed to localize and mark an adequate pocket of fluid in the RIGHT chest. The area was then prepped and draped in the normal sterile fashion.  1% Lidocaine was used for local anesthesia. Under ultrasound guidance a 8 French thoracentesis catheter was introduced. Thoracentesis was performed. The catheter was removed and a dressing applied. FINDINGS: A total of approximately 1 L of amber colored RIGHT pleural fluid was removed. Samples were sent to the laboratory as requested by the clinical team. IMPRESSION: Successful ultrasound guided RIGHT thoracentesis yielding 1 L of pleural fluid. Electronically Signed   By: Lavonia Dana M.D.   On: 12/15/2019 13:44    Kathie Dike, MD  [facility]  If 7PM-7AM, please contact night-coverage [email or web address]  12/18/2019, 6:50 PM   LOS: 4 days

## 2019-12-19 LAB — CBC
HCT: 34.3 % — ABNORMAL LOW (ref 36.0–46.0)
Hemoglobin: 9.8 g/dL — ABNORMAL LOW (ref 12.0–15.0)
MCH: 22.8 pg — ABNORMAL LOW (ref 26.0–34.0)
MCHC: 28.6 g/dL — ABNORMAL LOW (ref 30.0–36.0)
MCV: 79.8 fL — ABNORMAL LOW (ref 80.0–100.0)
Platelets: 290 10*3/uL (ref 150–400)
RBC: 4.3 MIL/uL (ref 3.87–5.11)
RDW: 20.5 % — ABNORMAL HIGH (ref 11.5–15.5)
WBC: 8.6 10*3/uL (ref 4.0–10.5)
nRBC: 0 % (ref 0.0–0.2)

## 2019-12-19 LAB — BASIC METABOLIC PANEL
Anion gap: 11 (ref 5–15)
BUN: 30 mg/dL — ABNORMAL HIGH (ref 8–23)
CO2: 32 mmol/L (ref 22–32)
Calcium: 9.1 mg/dL (ref 8.9–10.3)
Chloride: 96 mmol/L — ABNORMAL LOW (ref 98–111)
Creatinine, Ser: 1.29 mg/dL — ABNORMAL HIGH (ref 0.44–1.00)
GFR calc Af Amer: 44 mL/min — ABNORMAL LOW (ref >60)
GFR calc non Af Amer: 38 mL/min — ABNORMAL LOW (ref >60)
Glucose, Bld: 102 mg/dL — ABNORMAL HIGH (ref 70–99)
Potassium: 4.2 mmol/L (ref 3.5–5.1)
Sodium: 139 mmol/L (ref 135–145)

## 2019-12-19 NOTE — Progress Notes (Signed)
Physical Therapy Treatment Patient Details Name: Kristy Mckinney MRN: [id number] DOB: Jul 17, 1931 Today's Date: 12/19/2019    History of Present Illness 86 year old female with a history of permanent atrial fibrillation, diastolic CHF, GERD, anxiety, dementia, hypertension presenting with a syncopal episode.  The patient states that she was going across her kitchen to put a big potato in the microwave and started feeling dizzy.  The next thing she remembered was waking up on the floor with the potato scattered over the floor.  The patient has denied any recent chest pain, fevers, chills, palpitations, nausea, vomiting, diarrhea, abdominal pain, dysuria.  She recently followed up with her cardiology office on 12/04/2019.  Her torsemide was increased to 20 mg twice daily from 20 mg daily.  She endorses compliance.  However, the patient is not aware of any type of fluid restriction.  She has noted increasing lower extremity edema and just feeling "tired" with ambulation.    PT Comments    Patient does not require physical assist for transition to seated EOB. She demonstrates good sitting balance and tolerance while completing exercises EOB. She transfers to standing with RW and guard assistance for safety. She ambulates with slow, labored gait today and O2 sat decreases to 83% on room air with ambulation. Patient is limited by fatigue and tolerates sitting in chair at end of session with RN and MD present. Patient will benefit from continued physical therapy in hospital and recommended venue below to increase strength, balance, endurance for safe ADLs and gait.    Follow Up Recommendations  Home health PT;Supervision for mobility/OOB;Supervision - Intermittent     Equipment Recommendations  3in1 (PT)    Recommendations for Other Services       Precautions / Restrictions Precautions Precautions: Fall Restrictions Weight Bearing Restrictions: No    Mobility  Bed Mobility Overal bed  mobility: Needs Assistance Bed Mobility: Supine to Sit     Supine to sit: Supervision     General bed mobility comments: does not require physical assist  Transfers Overall transfer level: Needs assistance Equipment used: Rolling walker (2 wheeled) Transfers: Sit to/from [employer] Transfers Sit to Stand: Min guard Stand pivot transfers: Min guard      Lateral/Scoot Transfers: Min guard General transfer comment: min guard for safety  Ambulation/Gait Ambulation/Gait assistance: Min guard Gait Distance (Feet): 50 Feet Assistive device: Rolling walker (2 wheeled) Gait Pattern/deviations: Step-through pattern;Decreased step length - right;Decreased step length - left;Decreased stride length;Trunk flexed Gait velocity: decreased   General Gait Details: slow, labored gait with RW, limited by fatigue, O2 sat decreases to 83% on room air   Stairs             Wheelchair Mobility    Modified Rankin (Stroke Patients Only)       Balance Overall balance assessment: Needs assistance Sitting-balance support: Bilateral upper extremity supported;Feet supported Sitting balance-Leahy Scale: Good     Standing balance support: Bilateral upper extremity supported;During functional activity Standing balance-Leahy Scale: Fair Standing balance comment: fair with RW                            Cognition Arousal/Alertness: Awake/alert Behavior During Therapy: WFL for tasks assessed/performed Overall Cognitive Status: Within Functional Limits for tasks assessed  Exercises General Exercises - Lower Extremity Long Arc Quad: AROM;Both;10 reps;Seated Hip Flexion/Marching: AROM;Both;10 reps;Seated Toe Raises: AROM;Both;10 reps;Seated Heel Raises: AROM;Both;10 reps;Seated    General Comments        Pertinent Vitals/Pain Pain Assessment: No/denies pain    Home Living                      Prior  Function            PT Goals (current goals can now be found in the care plan section) Acute Rehab PT Goals Patient Stated Goal: Go home with HHPT. PT Goal Formulation: With patient/family Time For Goal Achievement: 12/29/19 Potential to Achieve Goals: Good Progress towards PT goals: Progressing toward goals    Frequency    Min 3X/week      PT Plan Current plan remains appropriate    Co-evaluation              AM-PAC PT "6 Clicks" Mobility   Outcome Measure  Help needed turning from your back to your side while in a flat bed without using bedrails?: A Little Help needed moving from lying on your back to sitting on the side of a flat bed without using bedrails?: A Little Help needed moving to and from a bed to a chair (including a wheelchair)?: A Little Help needed standing up from a chair using your arms (e.g., wheelchair or bedside chair)?: A Little Help needed to walk in hospital room?: A Little Help needed climbing 3-5 steps with a railing? : A Lot 6 Click Score: 17    End of Session Equipment Utilized During Treatment: Gait belt Activity Tolerance: Patient limited by fatigue Patient left: in chair;with call bell/phone within reach;with family/visitor present Nurse Communication: Mobility status PT Visit Diagnosis: Unsteadiness on feet (R26.81);Other abnormalities of gait and mobility (R26.89);History of falling (Z91.81);Muscle weakness (generalized) (M62.81);Pain     Time: 5009-3818 PT Time Calculation (min) (ACUTE ONLY): 18 min  Charges:  $Therapeutic Activity: 8-22 mins                     2:19 PM, 12/19/19 Mearl Latin PT, DPT Physical Therapist at [facility]

## 2019-12-19 NOTE — Care Management Important Message (Signed)
Important Message  Patient Details  Name: Kristy Mckinney MRN: [id number] Date of Birth: [birth year]   Medicare Important Message Given:  Yes     Tommy Medal 12/19/2019, 1:36 PM

## 2019-12-19 NOTE — Progress Notes (Signed)
PROGRESS NOTE  Kristy Mckinney NOB:[id number] DOB: Sep 08, 1931 DOA: 12/13/2019 PCP: Celene Squibb, MD   Brief History:  86 year old female with a history of permanent atrial fibrillation, diastolic CHF, GERD, anxiety, dementia, hypertension presenting with a syncopal episode.  The patient states that she was going across her kitchen to put a big potato in the microwave and started feeling dizzy.  The next thing she remembered was waking up on the floor with the potato scattered over the floor.  The patient has denied any recent chest pain, fevers, chills, palpitations, nausea, vomiting, diarrhea, abdominal pain, dysuria.  She recently followed up with her cardiology office on 12/04/2019.  Her torsemide was increased to 20 mg twice daily from 20 mg daily.  She endorses compliance.  However, the patient is not aware of any type of fluid restriction.  She has noted increasing lower extremity edema and just feeling "tired" with ambulation. In the emergency department, the patient was noted to have oxygen saturation of 78% on room air.  She was afebrile hemodynamically stable.  She was placed on supplemental oxygen with oxygen saturation up to 97%.  BMP showed a potassium 3.4 with unremarkable LFTs.  WBC 8.4, hemoglobin 9.4, platelets 304,000.  BNP was 902.  Troponins were unremarkable.  CT of the brain and C-spine were unremarkable.  Chest x-ray showed bilateral pleural effusions, right greater than left.  There is increased interstitial markings.  The patient was started IV furosemide.   Assessment/Plan: Acute respiratory failure with hypoxia -Secondary to pulmonary edema pleural effusion -Currently on 2 L nasal cannula, but has increased work of breathing -Wean oxygen for saturation greater 92% -Personally reviewed chest x-ray--bilateral pleural effusions, right greater than left.  Increased interstitial markings   Acute on chronic diastolic CHF, right-sided heart failure with severe pulmonary  hypertension -continue IV furosemide 40 mg IV twice daily -The patient remains clinically fluid overloaded, needs continued diuresis -has good urine output with IV lasix -Renal function slowly improving with diuresis -pt endorses some indiscretion with fluid intake at home -Accurate I's and Os--incomplete -Daily weights -cardiology consult -Patient states that last weight at home was 150.4 pounds on 12/13/2019 -6/7 R-side thoracocentesis--1L removed  Acute kidney injury on chronic kidney disease stage IIIa -Likely precipitated by CHF low output -Creatinine improving with diuresis -Continue to monitor renal function.   Syncope -Echo--EF 70-75%, grade 2DD, severe TR, severe elevated PASP -remain on tele -orthostatics -EEG--mild diffuse encephalopathy   Permanent atrial fibrillation -Rate controlled -Continue diltiazem CD -Amiodarone discontinued per cardiology -Metoprolol dosing adjusted for better rate control -Continue apixaban   Cognitive impairment/dementia without behavioral disturbance -Continue Namenda and Seroquel -Continue sertraline   Hypokalemia -Replete -Check magnesium--2.3   Goals of care -palliative medicine consult -frequent hospitalizations -changed to DNR -Palliative care follow-up as an outpatient -Discussed further goals of care with patient's daughter who will assist with further conversations with the patient -Emphasized importance of quality of life which the daughter agrees with.         Status is: Inpatient   The patient will require care spanning > 2 midnights and should be moved to inpatient because: IV treatments appropriate due to intensity of illness or inability to take PO  Remains fluid overloaded, requires IV lasix   Dispo: The patient is from: Home              Anticipated d/c is to: Home  Anticipated d/c date is: 1-2 days              Patient currently is not medically stable to d/c.   Family Communication:    Discussed with daughter, [name] over the phone 6/10   Consultants: Cardiology, palliative care   Code Status:  FULL   DVT Prophylaxis: apixaban     Procedures: As Listed in Progress Note Above   Antibiotics: None     Subjective: She says she is feeling better today. Became short of breath and hypoxic on ambulation.   Objective: Vitals:   12/18/19 2019 12/19/19 0500 12/19/19 0528 12/19/19 1351  BP: (!) 112/55  123/60 112/63  Pulse: (!) 104  89 76  Resp: 18  18 18   Temp: 97.8 F (36.6 C)  98.1 F (36.7 C) 97.9 F (36.6 C)  TempSrc: Oral  Oral   SpO2: 98%  90% 97%  Weight:  60.9 kg      Intake/Output Summary (Last 24 hours) at 12/19/2019 1814 Last data filed at 12/19/2019 0500 Gross per 24 hour  Intake 240 ml  Output 1200 ml  Net -960 ml   Weight change: 0 kg Exam:  General exam: Alert, awake, oriented x 3 Respiratory system: crackles at bases. Respiratory effort normal. Cardiovascular system:RRR. No murmurs, rubs, gallops. Gastrointestinal system: Abdomen is nondistended, soft and nontender. No organomegaly or masses felt. Normal bowel sounds heard. Central nervous system: Alert and oriented. No focal neurological deficits. Extremities: 1+ edema b/l Skin: No rashes, lesions or ulcers Psychiatry: Judgement and insight appear normal. Mood & affect appropriate.     Data Reviewed: I have personally reviewed following labs and imaging studies Basic Metabolic Panel: Recent Labs  Lab 12/15/19 0549 12/16/19 0549 12/17/19 0843 12/18/19 0548 12/19/19 0633  NA 139 137 136 138 139  K 3.3* 3.0* 3.6 4.2 4.2  CL 94* 91* 90* 94* 96*  CO2 31 35* 32 34* 32  GLUCOSE 116* 121* 164* 110* 102*  BUN 39* 31* 24* 25* 30*  CREATININE 1.89* 1.42* 1.25* 1.30* 1.29*  CALCIUM 9.1 8.5* 8.9 8.8* 9.1  MG 2.3  --   --  2.0  --    Liver Function Tests: Recent Labs  Lab 12/13/19 2152  AST 37  ALT 32  ALKPHOS 127*  BILITOT 1.1  PROT 7.1  ALBUMIN 3.9   No results for  input(s): LIPASE, AMYLASE in the last 168 hours. No results for input(s): AMMONIA in the last 168 hours. Coagulation Profile: No results for input(s): INR, PROTIME in the last 168 hours. CBC: Recent Labs  Lab 12/13/19 2152 12/19/19 0633  WBC 8.4 8.6  NEUTROABS 6.3  --   HGB 9.4* 9.8*  HCT 32.5* 34.3*  MCV 78.9* 79.8*  PLT 304 290   Cardiac Enzymes: No results for input(s): CKTOTAL, CKMB, CKMBINDEX, TROPONINI in the last 168 hours. BNP: Invalid input(s): POCBNP CBG: No results for input(s): GLUCAP in the last 168 hours. HbA1C: No results for input(s): HGBA1C in the last 72 hours. Urine analysis:    Component Value Date/Time   COLORURINE YELLOW 09/10/2019 1248   APPEARANCEUR CLEAR 09/10/2019 1248   LABSPEC 1.018 09/10/2019 1248   PHURINE 6.0 09/10/2019 1248   GLUCOSEU NEGATIVE 09/10/2019 1248   HGBUR NEGATIVE 09/10/2019 1248   BILIRUBINUR NEGATIVE 09/10/2019 1248   KETONESUR NEGATIVE 09/10/2019 1248   PROTEINUR 100 (A) 09/10/2019 1248   NITRITE NEGATIVE 09/10/2019 1248   LEUKOCYTESUR NEGATIVE 09/10/2019 1248   Sepsis Labs: @LABRCNTIP (procalcitonin:4,lacticidven:4) ) Recent Results (from  the past 240 hour(s))  SARS Coronavirus 2 by RT PCR (hospital order, performed in [facility] hospital lab) Nasopharyngeal Nasopharyngeal Swab     Status: None   Collection Time: 12/13/19 10:01 PM   Specimen: Nasopharyngeal Swab  Result Value Ref Range Status   SARS Coronavirus 2 NEGATIVE NEGATIVE Final    Comment: (NOTE) SARS-CoV-2 target nucleic acids are NOT DETECTED. The SARS-CoV-2 RNA is generally detectable in upper and lower respiratory specimens during the acute phase of infection. The lowest concentration of SARS-CoV-2 viral copies this assay can detect is 250 copies / mL. A negative result does not preclude SARS-CoV-2 infection and should not be used as the sole basis for treatment or other patient management decisions.  A negative result may occur with improper specimen  collection / handling, submission of specimen other than nasopharyngeal swab, presence of viral mutation(s) within the areas targeted by this assay, and inadequate number of viral copies (<250 copies / mL). A negative result must be combined with clinical observations, patient history, and epidemiological information. Fact Sheet for Patients:   StrictlyIdeas.no Fact Sheet for Healthcare Providers: BankingDealers.co.za This test is not yet approved or cleared  by the [place] FDA and has been authorized for detection and/or diagnosis of SARS-CoV-2 by FDA under an Emergency Use Authorization (EUA).  This EUA will remain in effect (meaning this test can be used) for the duration of the COVID-19 declaration under Section 564(b)(1) of the Act, 21 U.S.C. section 360bbb-3(b)(1), unless the authorization is terminated or revoked sooner. Performed at [facility], [address], [place], [place] [address]   Culture, body fluid-bottle     Status: None (Preliminary result)   Collection Time: 12/15/19  1:05 PM   Specimen: Fluid  Result Value Ref Range Status   Specimen Description FLUID PLEURAL COLLECTED BY DOCTOR  Final   Special Requests BOTTLES DRAWN AEROBIC AND ANAEROBIC 10CC EACH  Final   Culture   Final    NO GROWTH 4 DAYS Performed at [facility], [address] [place], [place] [address]    Report Status PENDING  Incomplete  Gram stain     Status: None   Collection Time: 12/15/19  1:05 PM   Specimen: Pleura  Result Value Ref Range Status   Specimen Description PLEURAL  Final   Special Requests PLEURAL  Final   Gram Stain   Final    CYTOSPIN SMEAR WBC PRESENT, PREDOMINANTLY MONONUCLEAR NO ORGANISMS SEEN Performed at [facility] Performed at [facility], [address] [place], [place] [address]    Report Status 12/15/2019 FINAL  Final     Scheduled Meds: . apixaban  5 mg Oral BID  . diltiazem  180 mg Oral Daily  .  furosemide  40 mg Intravenous BID  . memantine  5 mg Oral BID  . metoprolol tartrate  25 mg Oral BID  . potassium chloride  40 mEq Oral Daily  . QUEtiapine  25 mg Oral QHS  . sertraline  50 mg Oral QHS  . sodium chloride flush  3 mL Intravenous Q12H   Continuous Infusions: . sodium chloride      Procedures/Studies: DG Chest 1 View  Result Date: 12/15/2019 CLINICAL DATA:  Post RIGHT thoracentesis EXAM: CHEST  1 VIEW COMPARISON:  12/13/2019 FINDINGS: Enlargement of cardiac silhouette with mitral annular calcification. Atherosclerotic calcification aorta. Pulmonary vascular congestion. Small bibasilar pleural effusions and atelectasis decreased on RIGHT since prior exam post thoracentesis. Mild perihilar edema. No pneumothorax. Bones demineralized. IMPRESSION: No  pneumothorax following RIGHT thoracentesis. Enlargement of cardiac silhouette with pulmonary vascular congestion and mild pulmonary edema. Bibasilar effusions and atelectasis decreased on RIGHT since prior study Electronically Signed   By: Lavonia Dana M.D.   On: 12/15/2019 13:32   CT Head Wo Contrast  Result Date: 12/13/2019 CLINICAL DATA:  [place], back pain EXAM: CT HEAD WITHOUT CONTRAST CT CERVICAL SPINE WITHOUT CONTRAST TECHNIQUE: Multidetector CT imaging of the head and cervical spine was performed following the standard protocol without intravenous contrast. Multiplanar CT image reconstructions of the cervical spine were also generated. COMPARISON:  11/28/2018 FINDINGS: CT HEAD FINDINGS Brain: Chronic small vessel ischemic changes are seen within the periventricular white matter. No acute infarct or hemorrhage. Lateral ventricles and midline structures are unremarkable. No acute extra-axial fluid collections. No mass effect. Vascular: No hyperdense vessel or unexpected calcification. Skull: There is a small ex of little scalp hematoma. No underlying fracture. Remainder of the calvarium is unremarkable. Sinuses/Orbits: No acute finding.  Other: None. CT CERVICAL SPINE FINDINGS Alignment: Alignment is grossly anatomic. Skull base and vertebrae: No acute displaced fractures. Soft tissues and spinal canal: No prevertebral fluid or swelling. No visible canal hematoma. Disc levels: Disc space narrowing and osteophyte formation is seen from C3-4 through C6-7, most pronounced at the C5-6 level. At C3-4 there is minimal symmetrical neural foraminal encroachment. At C4-5 there is mild central stenosis with bilateral right greater than left neural foraminal encroachment. At C5-6 there is at least moderate to severe central canal stenosis with significant right greater than left neural foraminal encroachment. At C6-7 there is mild central stenosis with severe right greater than left neural foraminal encroachment. Upper chest: Airway is patent. Large right pleural effusion partially visualized. Left apex is clear. Other: Reconstructed images demonstrate no additional findings. IMPRESSION: 1. No acute intracranial process. 2. No acute cervical spine fracture. 3. Significant multilevel cervical spondylosis, most pronounced at C5-6. Significant but less severe changes are seen at C4-5 and C6-7 as above. 4. Large right pleural effusion. Electronically Signed   By: Randa Ngo M.D.   On: 12/13/2019 23:22   CT Cervical Spine Wo Contrast  Result Date: 12/13/2019 CLINICAL DATA:  [place], back pain EXAM: CT HEAD WITHOUT CONTRAST CT CERVICAL SPINE WITHOUT CONTRAST TECHNIQUE: Multidetector CT imaging of the head and cervical spine was performed following the standard protocol without intravenous contrast. Multiplanar CT image reconstructions of the cervical spine were also generated. COMPARISON:  11/28/2018 FINDINGS: CT HEAD FINDINGS Brain: Chronic small vessel ischemic changes are seen within the periventricular white matter. No acute infarct or hemorrhage. Lateral ventricles and midline structures are unremarkable. No acute extra-axial fluid collections. No mass  effect. Vascular: No hyperdense vessel or unexpected calcification. Skull: There is a small ex of little scalp hematoma. No underlying fracture. Remainder of the calvarium is unremarkable. Sinuses/Orbits: No acute finding. Other: None. CT CERVICAL SPINE FINDINGS Alignment: Alignment is grossly anatomic. Skull base and vertebrae: No acute displaced fractures. Soft tissues and spinal canal: No prevertebral fluid or swelling. No visible canal hematoma. Disc levels: Disc space narrowing and osteophyte formation is seen from C3-4 through C6-7, most pronounced at the C5-6 level. At C3-4 there is minimal symmetrical neural foraminal encroachment. At C4-5 there is mild central stenosis with bilateral right greater than left neural foraminal encroachment. At C5-6 there is at least moderate to severe central canal stenosis with significant right greater than left neural foraminal encroachment. At C6-7 there is mild central stenosis with severe right greater than left neural foraminal encroachment.  Upper chest: Airway is patent. Large right pleural effusion partially visualized. Left apex is clear. Other: Reconstructed images demonstrate no additional findings. IMPRESSION: 1. No acute intracranial process. 2. No acute cervical spine fracture. 3. Significant multilevel cervical spondylosis, most pronounced at C5-6. Significant but less severe changes are seen at C4-5 and C6-7 as above. 4. Large right pleural effusion. Electronically Signed   By: Randa Ngo M.D.   On: 12/13/2019 23:22   DG Pelvis Portable  Result Date: 12/13/2019 CLINICAL DATA:  [place], back pain EXAM: PORTABLE PELVIS 1-2 VIEWS COMPARISON:  None. FINDINGS: Single frontal view of the pelvis demonstrates postsurgical changes right hip. No acute displaced fracture. Left convex scoliosis lumbar spine. Sacroiliac joints are normal. Soft tissues are unremarkable. IMPRESSION: 1. No acute displaced fracture. Electronically Signed   By: Randa Ngo M.D.   On:  12/13/2019 22:46   DG Chest Portable 1 View  Result Date: 12/13/2019 CLINICAL DATA:  [place], pain EXAM: PORTABLE CHEST 1 VIEW COMPARISON:  10/13/2019 FINDINGS: Single frontal view of the chest demonstrates an enlarged right pleural effusion with right basilar atelectasis. Left basilar consolidation unchanged. No pneumothorax. Cardiac silhouette is stable. Atherosclerosis aortic arch. No acute displaced fractures. IMPRESSION: 1. Enlarging right pleural effusion. 2. Persistent bibasilar areas of consolidation likely atelectasis, increased on the right since prior study. Electronically Signed   By: Randa Ngo M.D.   On: 12/13/2019 22:43   EEG adult  Result Date: 12/15/2019 Lora Havens, MD     12/15/2019  2:20 PM Patient Name: Kristy Mckinney MRN: [id number] Epilepsy Attending: Lora Havens Referring Physician/Provider: Dr. Shanon Brow Tat Date: 12/15/2019 Duration: 23.39 minutes Patient history: 6 old female with syncope.  EEG evaluate for seizures. Level of alertness: Awake AEDs during EEG study: None Technical aspects: This EEG study was done with scalp electrodes positioned according to the 10-20 International system of electrode placement. Electrical activity was acquired at a sampling rate of 500Hz  and reviewed with a high frequency filter of 70Hz  and a low frequency filter of 1Hz . EEG data were recorded continuously and digitally stored. Description: The posterior dominant rhythm consists of 8 Hz activity of moderate voltage (25-35 uV) seen predominantly in posterior head regions, symmetric and reactive to eye opening and eye closing. EEG showed intermittent generalized 3 to 6 Hz theta-delta slowing. Hyperventilation and photic stimulation were not performed.   ABNORMALITY -Intermittent slow, generalized IMPRESSION: This study is suggestive of mild diffuse encephalopathy, nonspecific etiology. No seizures or epileptiform discharges were seen throughout the recording. Lora Havens   ECHOCARDIOGRAM  COMPLETE  Result Date: 12/14/2019    ECHOCARDIOGRAM REPORT   Patient Name:   Kristy Mckinney Date of Exam: 12/14/2019 Medical Rec #:  [id number]         Height:       64.5 in Accession #:    [id number]        Weight:       142.0 lb Date of Birth:  05-30-1932         BSA:          1.700 m Patient Age:    89 years          BP:           111/66 mmHg Patient Gender: F                 HR:           101 bpm. Exam Location:  [facility] Procedure: 2D Echo,  Cardiac Doppler and Color Doppler Indications:    CHF  History:        Patient has prior history of Echocardiogram examinations, most                 recent 04/04/2019. CHF, Arrythmias:Atrial Fibrillation,                 Signs/Symptoms:Dyspnea; Risk Factors:Hypertension, Dyslipidemia                 and Dementia.  Sonographer:    Dustin Flock RDCS Referring Phys: [phone] DAVID TAT IMPRESSIONS  1. Left ventricular ejection fraction, by estimation, is 70 to 75%. The left ventricle has hyperdynamic function. The left ventricle has no regional wall motion abnormalities. Left ventricular diastolic parameters are consistent with Grade II diastolic dysfunction (pseudonormalization). Elevated left atrial pressure.  2. Right ventricular systolic function is normal. The right ventricular size is normal. There is severely elevated pulmonary artery systolic pressure. The estimated right ventricular systolic pressure is 24.5 mmHg.  3. Left atrial size was moderately dilated.  4. Right atrial size was mildly dilated.  5. The mitral valve is degenerative. Trivial mitral valve regurgitation. Moderate mitral stenosis. The mean mitral valve gradient is 8.0 mmHg.  6. Tricuspid valve regurgitation is severe.  7. The aortic valve is tricuspid. Aortic valve regurgitation is not visualized. Mild to moderate aortic valve sclerosis/calcification is present, without any evidence of aortic stenosis. Aortic valve mean gradient measures 7.0 mmHg. Aortic valve Vmax measures 1.76 m/s.  8. The  inferior vena cava is normal in size with <50% respiratory variability, suggesting right atrial pressure of 8 mmHg. Comparison(s): No significant change from prior study. Prior images reviewed side by side. FINDINGS  Left Ventricle: Left ventricular ejection fraction, by estimation, is 70 to 75%. The left ventricle has hyperdynamic function. The left ventricle has no regional wall motion abnormalities. The left ventricular internal cavity size was normal in size. There is no left ventricular hypertrophy. Left ventricular diastolic parameters are consistent with Grade II diastolic dysfunction (pseudonormalization). Elevated left atrial pressure. Right Ventricle: The right ventricular size is normal. No increase in right ventricular wall thickness. Right ventricular systolic function is normal. There is severely elevated pulmonary artery systolic pressure. The tricuspid regurgitant velocity is 4.21 m/s, and with an assumed right atrial pressure of 3 mmHg, the estimated right ventricular systolic pressure is 80.9 mmHg. Left Atrium: Left atrial size was moderately dilated. Right Atrium: Right atrial size was mildly dilated. Pericardium: There is no evidence of pericardial effusion. Mitral Valve: The mitral valve is degenerative in appearance. There is severe thickening of the mitral valve leaflet(s). There is severe calcification of the mitral valve leaflet(s). Normal mobility of the mitral valve leaflets. Severe mitral annular calcification. Trivial mitral valve regurgitation. Moderate mitral valve stenosis. MV peak gradient, 17.6 mmHg. The mean mitral valve gradient is 8.0 mmHg. Tricuspid Valve: The tricuspid valve is normal in structure. Tricuspid valve regurgitation is severe. No evidence of tricuspid stenosis. Aortic Valve: The aortic valve is tricuspid. Aortic valve regurgitation is not visualized. Mild to moderate aortic valve sclerosis/calcification is present, without any evidence of aortic stenosis. Aortic  valve mean gradient measures 7.0 mmHg. Aortic valve peak gradient measures 12.4 mmHg. Aortic valve area, by VTI measures 2.21 cm. Pulmonic Valve: The pulmonic valve was normal in structure. Pulmonic valve regurgitation is mild. No evidence of pulmonic stenosis. Aorta: The aortic root is normal in size and structure. Venous: The inferior vena cava is normal in size  with less than 50% respiratory variability, suggesting right atrial pressure of 8 mmHg. IAS/Shunts: No atrial level shunt detected by color flow Doppler.  LEFT VENTRICLE PLAX 2D LVIDd:         3.29 cm  Diastology LVIDs:         2.34 cm  LV e' lateral:   7.10 cm/s LV PW:         1.06 cm  LV E/e' lateral: 25.5 LV IVS:        0.96 cm  LV e' medial:    3.42 cm/s LVOT diam:     2.00 cm  LV E/e' medial:  52.9 LV SV:         74 LV SV Index:   44 LVOT Area:     3.14 cm  RIGHT VENTRICLE RV Basal diam:  3.47 cm RV S prime:     5.98 cm/s TAPSE (M-mode): 2.1 cm LEFT ATRIUM             Index       RIGHT ATRIUM           Index LA diam:        5.40 cm 3.18 cm/m  RA Area:     19.10 cm LA Vol (A2C):   77.1 ml 45.34 ml/m RA Volume:   53.00 ml  31.17 ml/m LA Vol (A4C):   79.3 ml 46.64 ml/m LA Biplane Vol: 82.6 ml 48.58 ml/m  AORTIC VALVE AV Area (Vmax):    1.77 cm AV Area (Vmean):   1.79 cm AV Area (VTI):     2.21 cm AV Vmax:           176.00 cm/s AV Vmean:          121.000 cm/s AV VTI:            0.336 m AV Peak Grad:      12.4 mmHg AV Mean Grad:      7.0 mmHg LVOT Vmax:         98.90 cm/s LVOT Vmean:        68.900 cm/s LVOT VTI:          0.236 m LVOT/AV VTI ratio: 0.70  AORTA Ao Root diam: 2.90 cm MITRAL VALVE                TRICUSPID VALVE MV Area (PHT): 3.42 cm     TR Peak grad:   70.9 mmHg MV Peak grad:  17.6 mmHg    TR Vmax:        421.00 cm/s MV Mean grad:  8.0 mmHg MV Vmax:       2.10 m/s     SHUNTS MV Vmean:      128.5 cm/s   Systemic VTI:  0.24 m MV Decel Time: 222 msec     Systemic Diam: 2.00 cm MV E velocity: 181.00 cm/s MV A velocity: 66.30 cm/s MV  E/A ratio:  2.73 Candee Furbish MD Electronically signed by Candee Furbish MD Signature Date/Time: 12/14/2019/12:34:52 PM    Final    US THORACENTESIS ASP PLEURAL SPACE W/IMG GUIDE  Result Date: 12/15/2019 INDICATION: Recurrent RIGHT pleural effusion EXAM: ULTRASOUND GUIDED DIAGNOSTIC AND THERAPEUTIC RIGHT THORACENTESIS MEDICATIONS: None. COMPLICATIONS: None immediate. PROCEDURE: An ultrasound guided thoracentesis was thoroughly discussed with the patient and questions answered. The benefits, risks, alternatives and complications were also discussed. The patient understands and wishes to proceed with the procedure. Written consent was obtained. Ultrasound was performed to localize and mark an adequate  pocket of fluid in the RIGHT chest. The area was then prepped and draped in the normal sterile fashion. 1% Lidocaine was used for local anesthesia. Under ultrasound guidance a 8 French thoracentesis catheter was introduced. Thoracentesis was performed. The catheter was removed and a dressing applied. FINDINGS: A total of approximately 1 L of amber colored RIGHT pleural fluid was removed. Samples were sent to the laboratory as requested by the clinical team. IMPRESSION: Successful ultrasound guided RIGHT thoracentesis yielding 1 L of pleural fluid. Electronically Signed   By: Lavonia Dana M.D.   On: 12/15/2019 13:44    Kathie Dike, MD  [facility]  If 7PM-7AM, please contact night-coverage [email or web address]  12/19/2019, 6:14 PM   LOS: 5 days

## 2019-12-19 NOTE — Progress Notes (Signed)
Primary Cardiologist:  Domenic Polite  Subjective:  More talkative this am Was cold last night   Objective:  Vitals:   12/18/19 1923 12/18/19 2019 12/19/19 0500 12/19/19 0528  BP:  (!) 112/55  123/60  Pulse:  (!) 104  89  Resp:  18  18  Temp:  97.8 F (36.6 C)  98.1 F (36.7 C)  TempSrc:  Oral  Oral  SpO2: 97% 98%  90%  Weight:   60.9 kg     Intake/Output from previous day:  Intake/Output Summary (Last 24 hours) at 12/19/2019 0752 Last data filed at 12/19/2019 0500 Gross per 24 hour  Intake 480 ml  Output 2100 ml  Net -1620 ml    Physical Exam: Dementia Healthy:  appears stated age HEENT: normal Neck supple with no adenopathy JVP elevated l no bruits no thyromegaly Lungs clear with no wheezing and good diaphragmatic motion Heart:  S1/S2 SEM  murmur, no rub, gallop or click PMI normal Abdomen: benighn, BS positve, no tenderness, no AAA no bruit.  No HSM or HJR Distal pulses intact with no bruits Plus one bilateral edema Neuro non-focal Skin warm and dry No muscular weakness   Lab Results: Basic Metabolic Panel: Recent Labs    12/17/19 0843 12/18/19 0548  NA 136 138  K 3.6 4.2  CL 90* 94*  CO2 32 34*  GLUCOSE 164* 110*  BUN 24* 25*  CREATININE 1.25* 1.30*  CALCIUM 8.9 8.8*  MG  --  2.0    No results for input(s): TSH, T4TOTAL, T3FREE, THYROIDAB in the last 72 hours.  Invalid input(s): FREET3 Anemia Panel: No results for input(s): VITAMINB12, FOLATE, FERRITIN, TIBC, IRON, RETICCTPCT in the last 72 hours.  Imaging: No results found.  Cardiac Studies:  ECG: afib nonspecific ST changes    Telemetry: Afib rates around 90-100   Echo: IMPRESSIONS    1. Left ventricular ejection fraction, by estimation, is 70 to 75%. The  left ventricle has hyperdynamic function. The left ventricle has no  regional wall motion abnormalities. Left ventricular diastolic parameters  are consistent with Grade II diastolic  dysfunction (pseudonormalization). Elevated  left atrial pressure.  2. Right ventricular systolic function is normal. The right ventricular  size is normal. There is severely elevated pulmonary artery systolic  pressure. The estimated right ventricular systolic pressure is 01.6 mmHg.  3. Left atrial size was moderately dilated.  4. Right atrial size was mildly dilated.  5. The mitral valve is degenerative. Trivial mitral valve regurgitation.  Moderate mitral stenosis. The mean mitral valve gradient is 8.0 mmHg.  6. Tricuspid valve regurgitation is severe.  7. The aortic valve is tricuspid. Aortic valve regurgitation is not  visualized. Mild to moderate aortic valve sclerosis/calcification is  present, without any evidence of aortic stenosis. Aortic valve mean  gradient measures 7.0 mmHg. Aortic valve Vmax  measures 1.76 m/s.  8. The inferior vena cava is normal in size with <50% respiratory  variability, suggesting right atrial pressure of 8 mmHg.   Medications:   . apixaban  5 mg Oral BID  . diltiazem  180 mg Oral Daily  . furosemide  40 mg Intravenous BID  . memantine  5 mg Oral BID  . metoprolol tartrate  25 mg Oral BID  . potassium chloride  40 mEq Oral Daily  . QUEtiapine  25 mg Oral QHS  . sertraline  50 mg Oral QHS  . sodium chloride flush  3 mL Intravenous Q12H     . sodium chloride  Assessment/Plan:    1. Afib:  Chronic amiodarone d/c Beta blocker added 12/18/19 cardizem also increased rate control getting better Increase Lopressor to 50 bid over weekend if BP permits   2. Right heart failure:  With pulmonary HTN functional mitral stenosis not a candidate for surgical correction or invasive evaluation given age and dementia continue diuretic   Jenkins Rouge 12/19/2019, 7:52 AM

## 2019-12-20 LAB — BASIC METABOLIC PANEL
Anion gap: 12 (ref 5–15)
BUN: 35 mg/dL — ABNORMAL HIGH (ref 8–23)
CO2: 34 mmol/L — ABNORMAL HIGH (ref 22–32)
Calcium: 9.5 mg/dL (ref 8.9–10.3)
Chloride: 96 mmol/L — ABNORMAL LOW (ref 98–111)
Creatinine, Ser: 1.35 mg/dL — ABNORMAL HIGH (ref 0.44–1.00)
GFR calc Af Amer: 41 mL/min — ABNORMAL LOW (ref >60)
GFR calc non Af Amer: 36 mL/min — ABNORMAL LOW (ref >60)
Glucose, Bld: 105 mg/dL — ABNORMAL HIGH (ref 70–99)
Potassium: 4.1 mmol/L (ref 3.5–5.1)
Sodium: 142 mmol/L (ref 135–145)

## 2019-12-20 LAB — CULTURE, BODY FLUID W GRAM STAIN -BOTTLE: Culture: NO GROWTH

## 2019-12-20 MED ORDER — APIXABAN 2.5 MG PO TABS
2.5000 mg | ORAL_TABLET | Freq: Two times a day (BID) | ORAL | Status: DC
  Administered 2019-12-20 – 2019-12-23 (×7): 2.5 mg via ORAL
  Filled 2019-12-20 (×7): qty 1

## 2019-12-20 MED ORDER — TORSEMIDE 20 MG PO TABS
20.0000 mg | ORAL_TABLET | Freq: Two times a day (BID) | ORAL | Status: DC
  Administered 2019-12-20 – 2019-12-23 (×6): 20 mg via ORAL
  Filled 2019-12-20 (×6): qty 1

## 2019-12-20 NOTE — Progress Notes (Signed)
PROGRESS NOTE  Kristy Mckinney QPY:[id number] DOB: [birth year] DOA: 12/13/2019 PCP: Celene Squibb, MD   Brief History:  86 year old female with a history of permanent atrial fibrillation, diastolic CHF, GERD, anxiety, dementia, hypertension presenting with a syncopal episode.  The patient states that she was going across her kitchen to put a big potato in the microwave and started feeling dizzy.  The next thing she remembered was waking up on the floor with the potato scattered over the floor.  The patient has denied any recent chest pain, fevers, chills, palpitations, nausea, vomiting, diarrhea, abdominal pain, dysuria.  She recently followed up with her cardiology office on 12/04/2019.  Her torsemide was increased to 20 mg twice daily from 20 mg daily.  She endorses compliance.  However, the patient is not aware of any type of fluid restriction.  She has noted increasing lower extremity edema and just feeling "tired" with ambulation. In the emergency department, the patient was noted to have oxygen saturation of 78% on room air.  She was afebrile hemodynamically stable.  She was placed on supplemental oxygen with oxygen saturation up to 97%.  BMP showed a potassium 3.4 with unremarkable LFTs.  WBC 8.4, hemoglobin 9.4, platelets 304,000.  BNP was 902.  Troponins were unremarkable.  CT of the brain and C-spine were unremarkable.  Chest x-ray showed bilateral pleural effusions, right greater than left.  There is increased interstitial markings.  The patient was started IV furosemide.   Assessment/Plan: Acute respiratory failure with hypoxia -Secondary to pulmonary edema pleural effusion -Currently on 2 L nasal cannula, but has increased work of breathing -Wean oxygen for saturation greater 92% -Personally reviewed chest x-ray--bilateral pleural effusions, right greater than left.  Increased interstitial markings   Acute on chronic diastolic CHF, right-sided heart failure with severe pulmonary  hypertension -currently on IV furosemide 40 mg IV twice daily -has good urine output with IV lasix -Renal function slowly improving with diuresis, but now beginning to trend up -will transition to oral torsemide -pt endorses some indiscretion with fluid intake at home -Accurate I's and Os--incomplete -Daily weights -cardiology following -Patient states that last weight at home was 150.4 pounds on 12/13/2019, current weight is 127 lbs -6/7 R-side thoracocentesis--1L removed  Acute kidney injury on chronic kidney disease stage IIIa -Likely precipitated by CHF low output -Creatinine improving with diuresis -Continue to monitor renal function.   Syncope -Echo--EF 70-75%, grade 2DD, severe TR, severe elevated PASP -remain on tele -orthostatics -EEG--mild diffuse encephalopathy   Permanent atrial fibrillation -Rate controlled -Continue diltiazem CD -Amiodarone discontinued per cardiology -Metoprolol dosing adjusted for better rate control -Continue apixaban   Cognitive impairment/dementia without behavioral disturbance -Continue Namenda and Seroquel -Continue sertraline   Hypokalemia -Replete -Check magnesium--2.3   Goals of care -palliative medicine consult -frequent hospitalizations -changed to DNR -Palliative care follow-up as an outpatient -Discussed further goals of care with patient's daughter who will assist with further conversations with the patient -Emphasized importance of quality of life which the daughter agrees with. -Daughter agrees with hospice referral on discharge    Status is: Inpatient   The patient will require care spanning > 2 midnights and should be moved to inpatient because: IV treatments appropriate due to intensity of illness or inability to take PO  Volume status appears to be improving. Transition to torsemide   Dispo: The patient is from: Home              Anticipated  d/c is to: Home              Anticipated d/c date is: 1 day               Patient currently is not medically stable to d/c.   Family Communication:   Discussed with daughter, [name] over the phone 6/12   Consultants: Cardiology, palliative care   Code Status:  DNR   DVT Prophylaxis: apixaban     Procedures: As Listed in Progress Note Above   Antibiotics: None     Subjective: Shortness of breath improving. No chest pain  Objective: Vitals:   12/19/19 2109 12/20/19 0528 12/20/19 1608 12/20/19 1805  BP: (!) 107/52 108/61 (!) 106/45   Pulse: 84 88 86   Resp: 18 18 18    Temp: 98.3 F (36.8 C) 98.1 F (36.7 C) 98.3 F (36.8 C)   TempSrc: Oral Oral Oral   SpO2: 97% 94% 97%   Weight:  57.9 kg    Height:    5' 4.5" (1.638 m)    Intake/Output Summary (Last 24 hours) at 12/20/2019 1929 Last data filed at 12/20/2019 1700 Gross per 24 hour  Intake 480 ml  Output 1400 ml  Net -920 ml   Weight change: -3 kg Exam:  General exam: Alert, awake, oriented x 3 Respiratory system: Clear to auscultation. Respiratory effort normal. Cardiovascular system:RRR. No murmurs, rubs, gallops. Gastrointestinal system: Abdomen is nondistended, soft and nontender. No organomegaly or masses felt. Normal bowel sounds heard. Central nervous system: Alert and oriented. No focal neurological deficits. Extremities: trace edema b/l Skin: No rashes, lesions or ulcers Psychiatry: Judgement and insight appear normal. Mood & affect appropriate.    Data Reviewed: I have personally reviewed following labs and imaging studies Basic Metabolic Panel: Recent Labs  Lab 12/15/19 0549 12/15/19 0549 12/16/19 0549 12/17/19 0843 12/18/19 0548 12/19/19 0633 12/20/19 0643  NA 139   < > 137 136 138 139 142  K 3.3*   < > 3.0* 3.6 4.2 4.2 4.1  CL 94*   < > 91* 90* 94* 96* 96*  CO2 31   < > 35* 32 34* 32 34*  GLUCOSE 116*   < > 121* 164* 110* 102* 105*  BUN 39*   < > 31* 24* 25* 30* 35*  CREATININE 1.89*   < > 1.42* 1.25* 1.30* 1.29* 1.35*  CALCIUM 9.1   < > 8.5* 8.9 8.8* 9.1  9.5  MG 2.3  --   --   --  2.0  --   --    < > = values in this interval not displayed.   Liver Function Tests: Recent Labs  Lab 12/13/19 2152  AST 37  ALT 32  ALKPHOS 127*  BILITOT 1.1  PROT 7.1  ALBUMIN 3.9   No results for input(s): LIPASE, AMYLASE in the last 168 hours. No results for input(s): AMMONIA in the last 168 hours. Coagulation Profile: No results for input(s): INR, PROTIME in the last 168 hours. CBC: Recent Labs  Lab 12/13/19 2152 12/19/19 0633  WBC 8.4 8.6  NEUTROABS 6.3  --   HGB 9.4* 9.8*  HCT 32.5* 34.3*  MCV 78.9* 79.8*  PLT 304 290   Cardiac Enzymes: No results for input(s): CKTOTAL, CKMB, CKMBINDEX, TROPONINI in the last 168 hours. BNP: Invalid input(s): POCBNP CBG: No results for input(s): GLUCAP in the last 168 hours. HbA1C: No results for input(s): HGBA1C in the last 72 hours. Urine analysis:    Component Value  Date/Time   COLORURINE YELLOW 09/10/2019 1248   APPEARANCEUR CLEAR 09/10/2019 1248   LABSPEC 1.018 09/10/2019 1248   PHURINE 6.0 09/10/2019 1248   GLUCOSEU NEGATIVE 09/10/2019 1248   HGBUR NEGATIVE 09/10/2019 1248   BILIRUBINUR NEGATIVE 09/10/2019 1248   [place] 09/10/2019 1248   PROTEINUR 100 (A) 09/10/2019 1248   NITRITE NEGATIVE 09/10/2019 1248   LEUKOCYTESUR NEGATIVE 09/10/2019 1248   Sepsis Labs: @LABRCNTIP (procalcitonin:4,lacticidven:4) ) Recent Results (from the past 240 hour(s))  SARS Coronavirus 2 by RT PCR (hospital order, performed in [facility] hospital lab) Nasopharyngeal Nasopharyngeal Swab     Status: None   Collection Time: 12/13/19 10:01 PM   Specimen: Nasopharyngeal Swab  Result Value Ref Range Status   SARS Coronavirus 2 NEGATIVE NEGATIVE Final    Comment: (NOTE) SARS-CoV-2 target nucleic acids are NOT DETECTED. The SARS-CoV-2 RNA is generally detectable in upper and lower respiratory specimens during the acute phase of infection. The lowest concentration of SARS-CoV-2 viral copies this  assay can detect is 250 copies / mL. A negative result does not preclude SARS-CoV-2 infection and should not be used as the sole basis for treatment or other patient management decisions.  A negative result may occur with improper specimen collection / handling, submission of specimen other than nasopharyngeal swab, presence of viral mutation(s) within the areas targeted by this assay, and inadequate number of viral copies (<250 copies / mL). A negative result must be combined with clinical observations, patient history, and epidemiological information. Fact Sheet for Patients:   StrictlyIdeas.no Fact Sheet for Healthcare Providers: BankingDealers.co.za This test is not yet approved or cleared  by the [place] FDA and has been authorized for detection and/or diagnosis of SARS-CoV-2 by FDA under an Emergency Use Authorization (EUA).  This EUA will remain in effect (meaning this test can be used) for the duration of the COVID-19 declaration under Section 564(b)(1) of the Act, 21 U.S.C. section 360bbb-3(b)(1), unless the authorization is terminated or revoked sooner. Performed at [facility], [address] [place], [place] [address]   Culture, body fluid-bottle     Status: None   Collection Time: 12/15/19  1:05 PM   Specimen: Fluid  Result Value Ref Range Status   Specimen Description FLUID PLEURAL COLLECTED BY DOCTOR  Final   Special Requests BOTTLES DRAWN AEROBIC AND ANAEROBIC 10CC EACH  Final   Culture   Final    NO GROWTH 5 DAYS Performed at [facility], [address], [place], [place] [address]    Report Status 12/20/2019 FINAL  Final  Gram stain     Status: None   Collection Time: 12/15/19  1:05 PM   Specimen: Pleura  Result Value Ref Range Status   Specimen Description PLEURAL  Final   Special Requests PLEURAL  Final   Gram Stain   Final    CYTOSPIN SMEAR WBC PRESENT, PREDOMINANTLY MONONUCLEAR NO ORGANISMS  SEEN Performed at [facility] Performed at [facility], [address], [place], [place] [address]    Report Status 12/15/2019 FINAL  Final     Scheduled Meds: . apixaban  2.5 mg Oral BID  . diltiazem  180 mg Oral Daily  . furosemide  40 mg Intravenous BID  . memantine  5 mg Oral BID  . metoprolol tartrate  25 mg Oral BID  . potassium chloride  40 mEq Oral Daily  . QUEtiapine  25 mg Oral QHS  . sertraline  50 mg Oral QHS  . sodium chloride flush  3  mL Intravenous Q12H   Continuous Infusions: . sodium chloride      Procedures/Studies: DG Chest 1 View  Result Date: 12/15/2019 CLINICAL DATA:  Post RIGHT thoracentesis EXAM: CHEST  1 VIEW COMPARISON:  12/13/2019 FINDINGS: Enlargement of cardiac silhouette with mitral annular calcification. Atherosclerotic calcification aorta. Pulmonary vascular congestion. Small bibasilar pleural effusions and atelectasis decreased on RIGHT since prior exam post thoracentesis. Mild perihilar edema. No pneumothorax. Bones demineralized. IMPRESSION: No pneumothorax following RIGHT thoracentesis. Enlargement of cardiac silhouette with pulmonary vascular congestion and mild pulmonary edema. Bibasilar effusions and atelectasis decreased on RIGHT since prior study Electronically Signed   By: Lavonia Dana M.D.   On: 12/15/2019 13:32   CT Head Wo Contrast  Result Date: 12/13/2019 CLINICAL DATA:  [place], back pain EXAM: CT HEAD WITHOUT CONTRAST CT CERVICAL SPINE WITHOUT CONTRAST TECHNIQUE: Multidetector CT imaging of the head and cervical spine was performed following the standard protocol without intravenous contrast. Multiplanar CT image reconstructions of the cervical spine were also generated. COMPARISON:  11/28/2018 FINDINGS: CT HEAD FINDINGS Brain: Chronic small vessel ischemic changes are seen within the periventricular white matter. No acute infarct or hemorrhage. Lateral ventricles and midline structures are unremarkable. No acute extra-axial fluid  collections. No mass effect. Vascular: No hyperdense vessel or unexpected calcification. Skull: There is a small ex of little scalp hematoma. No underlying fracture. Remainder of the calvarium is unremarkable. Sinuses/Orbits: No acute finding. Other: None. CT CERVICAL SPINE FINDINGS Alignment: Alignment is grossly anatomic. Skull base and vertebrae: No acute displaced fractures. Soft tissues and spinal canal: No prevertebral fluid or swelling. No visible canal hematoma. Disc levels: Disc space narrowing and osteophyte formation is seen from C3-4 through C6-7, most pronounced at the C5-6 level. At C3-4 there is minimal symmetrical neural foraminal encroachment. At C4-5 there is mild central stenosis with bilateral right greater than left neural foraminal encroachment. At C5-6 there is at least moderate to severe central canal stenosis with significant right greater than left neural foraminal encroachment. At C6-7 there is mild central stenosis with severe right greater than left neural foraminal encroachment. Upper chest: Airway is patent. Large right pleural effusion partially visualized. Left apex is clear. Other: Reconstructed images demonstrate no additional findings. IMPRESSION: 1. No acute intracranial process. 2. No acute cervical spine fracture. 3. Significant multilevel cervical spondylosis, most pronounced at C5-6. Significant but less severe changes are seen at C4-5 and C6-7 as above. 4. Large right pleural effusion. Electronically Signed   By: Randa Ngo M.D.   On: 12/13/2019 23:22   CT Cervical Spine Wo Contrast  Result Date: 12/13/2019 CLINICAL DATA:  [place], back pain EXAM: CT HEAD WITHOUT CONTRAST CT CERVICAL SPINE WITHOUT CONTRAST TECHNIQUE: Multidetector CT imaging of the head and cervical spine was performed following the standard protocol without intravenous contrast. Multiplanar CT image reconstructions of the cervical spine were also generated. COMPARISON:  11/28/2018 FINDINGS: CT HEAD  FINDINGS Brain: Chronic small vessel ischemic changes are seen within the periventricular white matter. No acute infarct or hemorrhage. Lateral ventricles and midline structures are unremarkable. No acute extra-axial fluid collections. No mass effect. Vascular: No hyperdense vessel or unexpected calcification. Skull: There is a small ex of little scalp hematoma. No underlying fracture. Remainder of the calvarium is unremarkable. Sinuses/Orbits: No acute finding. Other: None. CT CERVICAL SPINE FINDINGS Alignment: Alignment is grossly anatomic. Skull base and vertebrae: No acute displaced fractures. Soft tissues and spinal canal: No prevertebral fluid or swelling. No visible canal hematoma. Disc levels: Disc space narrowing and  osteophyte formation is seen from C3-4 through C6-7, most pronounced at the C5-6 level. At C3-4 there is minimal symmetrical neural foraminal encroachment. At C4-5 there is mild central stenosis with bilateral right greater than left neural foraminal encroachment. At C5-6 there is at least moderate to severe central canal stenosis with significant right greater than left neural foraminal encroachment. At C6-7 there is mild central stenosis with severe right greater than left neural foraminal encroachment. Upper chest: Airway is patent. Large right pleural effusion partially visualized. Left apex is clear. Other: Reconstructed images demonstrate no additional findings. IMPRESSION: 1. No acute intracranial process. 2. No acute cervical spine fracture. 3. Significant multilevel cervical spondylosis, most pronounced at C5-6. Significant but less severe changes are seen at C4-5 and C6-7 as above. 4. Large right pleural effusion. Electronically Signed   By: Randa Ngo M.D.   On: 12/13/2019 23:22   DG Pelvis Portable  Result Date: 12/13/2019 CLINICAL DATA:  [place], back pain EXAM: PORTABLE PELVIS 1-2 VIEWS COMPARISON:  None. FINDINGS: Single frontal view of the pelvis demonstrates postsurgical  changes right hip. No acute displaced fracture. Left convex scoliosis lumbar spine. Sacroiliac joints are normal. Soft tissues are unremarkable. IMPRESSION: 1. No acute displaced fracture. Electronically Signed   By: Randa Ngo M.D.   On: 12/13/2019 22:46   DG Chest Portable 1 View  Result Date: 12/13/2019 CLINICAL DATA:  [place], pain EXAM: PORTABLE CHEST 1 VIEW COMPARISON:  10/13/2019 FINDINGS: Single frontal view of the chest demonstrates an enlarged right pleural effusion with right basilar atelectasis. Left basilar consolidation unchanged. No pneumothorax. Cardiac silhouette is stable. Atherosclerosis aortic arch. No acute displaced fractures. IMPRESSION: 1. Enlarging right pleural effusion. 2. Persistent bibasilar areas of consolidation likely atelectasis, increased on the right since prior study. Electronically Signed   By: Randa Ngo M.D.   On: 12/13/2019 22:43   EEG adult  Result Date: 12/15/2019 Lora Havens, MD     12/15/2019  2:20 PM Patient Name: GALEN MALKOWSKI MRN: [id number] Epilepsy Attending: Lora Havens Referring Physician/Provider: Dr. Shanon Brow Tat Date: 12/15/2019 Duration: 23.39 minutes Patient history: 22 old female with syncope.  EEG evaluate for seizures. Level of alertness: Awake AEDs during EEG study: None Technical aspects: This EEG study was done with scalp electrodes positioned according to the 10-20 International system of electrode placement. Electrical activity was acquired at a sampling rate of 500Hz  and reviewed with a high frequency filter of 70Hz  and a low frequency filter of 1Hz . EEG data were recorded continuously and digitally stored. Description: The posterior dominant rhythm consists of 8 Hz activity of moderate voltage (25-35 uV) seen predominantly in posterior head regions, symmetric and reactive to eye opening and eye closing. EEG showed intermittent generalized 3 to 6 Hz theta-delta slowing. Hyperventilation and photic stimulation were not performed.    ABNORMALITY -Intermittent slow, generalized IMPRESSION: This study is suggestive of mild diffuse encephalopathy, nonspecific etiology. No seizures or epileptiform discharges were seen throughout the recording. Lora Havens   ECHOCARDIOGRAM COMPLETE  Result Date: 12/14/2019    ECHOCARDIOGRAM REPORT   Patient Name:   ANIJA BRICKNER Date of Exam: 12/14/2019 Medical Rec #:  [id number]         Height:       64.5 in Accession #:    [id number]        Weight:       142.0 lb Date of Birth:  [birth year]         BSA:  1.700 m Patient Age:    78 years          BP:           111/66 mmHg Patient Gender: F                 HR:           101 bpm. Exam Location:  [facility] Procedure: 2D Echo, Cardiac Doppler and Color Doppler Indications:    CHF  History:        Patient has prior history of Echocardiogram examinations, most                 recent 04/04/2019. CHF, Arrythmias:Atrial Fibrillation,                 Signs/Symptoms:Dyspnea; Risk Factors:Hypertension, Dyslipidemia                 and Dementia.  Sonographer:    Dustin Flock RDCS Referring Phys: [phone] DAVID TAT IMPRESSIONS  1. Left ventricular ejection fraction, by estimation, is 70 to 75%. The left ventricle has hyperdynamic function. The left ventricle has no regional wall motion abnormalities. Left ventricular diastolic parameters are consistent with Grade II diastolic dysfunction (pseudonormalization). Elevated left atrial pressure.  2. Right ventricular systolic function is normal. The right ventricular size is normal. There is severely elevated pulmonary artery systolic pressure. The estimated right ventricular systolic pressure is 35.3 mmHg.  3. Left atrial size was moderately dilated.  4. Right atrial size was mildly dilated.  5. The mitral valve is degenerative. Trivial mitral valve regurgitation. Moderate mitral stenosis. The mean mitral valve gradient is 8.0 mmHg.  6. Tricuspid valve regurgitation is severe.  7. The aortic valve is tricuspid.  Aortic valve regurgitation is not visualized. Mild to moderate aortic valve sclerosis/calcification is present, without any evidence of aortic stenosis. Aortic valve mean gradient measures 7.0 mmHg. Aortic valve Vmax measures 1.76 m/s.  8. The inferior vena cava is normal in size with <50% respiratory variability, suggesting right atrial pressure of 8 mmHg. Comparison(s): No significant change from prior study. Prior images reviewed side by side. FINDINGS  Left Ventricle: Left ventricular ejection fraction, by estimation, is 70 to 75%. The left ventricle has hyperdynamic function. The left ventricle has no regional wall motion abnormalities. The left ventricular internal cavity size was normal in size. There is no left ventricular hypertrophy. Left ventricular diastolic parameters are consistent with Grade II diastolic dysfunction (pseudonormalization). Elevated left atrial pressure. Right Ventricle: The right ventricular size is normal. No increase in right ventricular wall thickness. Right ventricular systolic function is normal. There is severely elevated pulmonary artery systolic pressure. The tricuspid regurgitant velocity is 4.21 m/s, and with an assumed right atrial pressure of 3 mmHg, the estimated right ventricular systolic pressure is 29.9 mmHg. Left Atrium: Left atrial size was moderately dilated. Right Atrium: Right atrial size was mildly dilated. Pericardium: There is no evidence of pericardial effusion. Mitral Valve: The mitral valve is degenerative in appearance. There is severe thickening of the mitral valve leaflet(s). There is severe calcification of the mitral valve leaflet(s). Normal mobility of the mitral valve leaflets. Severe mitral annular calcification. Trivial mitral valve regurgitation. Moderate mitral valve stenosis. MV peak gradient, 17.6 mmHg. The mean mitral valve gradient is 8.0 mmHg. Tricuspid Valve: The tricuspid valve is normal in structure. Tricuspid valve regurgitation is severe.  No evidence of tricuspid stenosis. Aortic Valve: The aortic valve is tricuspid. Aortic valve regurgitation is not visualized. Mild to moderate  aortic valve sclerosis/calcification is present, without any evidence of aortic stenosis. Aortic valve mean gradient measures 7.0 mmHg. Aortic valve peak gradient measures 12.4 mmHg. Aortic valve area, by VTI measures 2.21 cm. Pulmonic Valve: The pulmonic valve was normal in structure. Pulmonic valve regurgitation is mild. No evidence of pulmonic stenosis. Aorta: The aortic root is normal in size and structure. Venous: The inferior vena cava is normal in size with less than 50% respiratory variability, suggesting right atrial pressure of 8 mmHg. IAS/Shunts: No atrial level shunt detected by color flow Doppler.  LEFT VENTRICLE PLAX 2D LVIDd:         3.29 cm  Diastology LVIDs:         2.34 cm  LV e' lateral:   7.10 cm/s LV PW:         1.06 cm  LV E/e' lateral: 25.5 LV IVS:        0.96 cm  LV e' medial:    3.42 cm/s LVOT diam:     2.00 cm  LV E/e' medial:  52.9 LV SV:         74 LV SV Index:   44 LVOT Area:     3.14 cm  RIGHT VENTRICLE RV Basal diam:  3.47 cm RV S prime:     5.98 cm/s TAPSE (M-mode): 2.1 cm LEFT ATRIUM             Index       RIGHT ATRIUM           Index LA diam:        5.40 cm 3.18 cm/m  RA Area:     19.10 cm LA Vol (A2C):   77.1 ml 45.34 ml/m RA Volume:   53.00 ml  31.17 ml/m LA Vol (A4C):   79.3 ml 46.64 ml/m LA Biplane Vol: 82.6 ml 48.58 ml/m  AORTIC VALVE AV Area (Vmax):    1.77 cm AV Area (Vmean):   1.79 cm AV Area (VTI):     2.21 cm AV Vmax:           176.00 cm/s AV Vmean:          121.000 cm/s AV VTI:            0.336 m AV Peak Grad:      12.4 mmHg AV Mean Grad:      7.0 mmHg LVOT Vmax:         98.90 cm/s LVOT Vmean:        68.900 cm/s LVOT VTI:          0.236 m LVOT/AV VTI ratio: 0.70  AORTA Ao Root diam: 2.90 cm MITRAL VALVE                TRICUSPID VALVE MV Area (PHT): 3.42 cm     TR Peak grad:   70.9 mmHg MV Peak grad:  17.6 mmHg    TR  Vmax:        421.00 cm/s MV Mean grad:  8.0 mmHg MV Vmax:       2.10 m/s     SHUNTS MV Vmean:      128.5 cm/s   Systemic VTI:  0.24 m MV Decel Time: 222 msec     Systemic Diam: 2.00 cm MV E velocity: 181.00 cm/s MV A velocity: 66.30 cm/s MV E/A ratio:  2.73 Candee Furbish MD Electronically signed by Candee Furbish MD Signature Date/Time: 12/14/2019/12:34:52 PM    Final    US THORACENTESIS ASP PLEURAL  SPACE W/IMG GUIDE  Result Date: 12/15/2019 INDICATION: Recurrent RIGHT pleural effusion EXAM: ULTRASOUND GUIDED DIAGNOSTIC AND THERAPEUTIC RIGHT THORACENTESIS MEDICATIONS: None. COMPLICATIONS: None immediate. PROCEDURE: An ultrasound guided thoracentesis was thoroughly discussed with the patient and questions answered. The benefits, risks, alternatives and complications were also discussed. The patient understands and wishes to proceed with the procedure. Written consent was obtained. Ultrasound was performed to localize and mark an adequate pocket of fluid in the RIGHT chest. The area was then prepped and draped in the normal sterile fashion. 1% Lidocaine was used for local anesthesia. Under ultrasound guidance a 8 French thoracentesis catheter was introduced. Thoracentesis was performed. The catheter was removed and a dressing applied. FINDINGS: A total of approximately 1 L of amber colored RIGHT pleural fluid was removed. Samples were sent to the laboratory as requested by the clinical team. IMPRESSION: Successful ultrasound guided RIGHT thoracentesis yielding 1 L of pleural fluid. Electronically Signed   By: Lavonia Dana M.D.   On: 12/15/2019 13:44    Kathie Dike, MD  [facility]  If 7PM-7AM, please contact night-coverage [email or web address]  12/20/2019, 7:29 PM   LOS: 6 days

## 2019-12-21 LAB — BASIC METABOLIC PANEL
Anion gap: 11 (ref 5–15)
BUN: 40 mg/dL — ABNORMAL HIGH (ref 8–23)
CO2: 32 mmol/L (ref 22–32)
Calcium: 9.1 mg/dL (ref 8.9–10.3)
Chloride: 96 mmol/L — ABNORMAL LOW (ref 98–111)
Creatinine, Ser: 1.41 mg/dL — ABNORMAL HIGH (ref 0.44–1.00)
GFR calc Af Amer: 39 mL/min — ABNORMAL LOW (ref >60)
GFR calc non Af Amer: 34 mL/min — ABNORMAL LOW (ref >60)
Glucose, Bld: 104 mg/dL — ABNORMAL HIGH (ref 70–99)
Potassium: 3.8 mmol/L (ref 3.5–5.1)
Sodium: 139 mmol/L (ref 135–145)

## 2019-12-21 NOTE — Progress Notes (Signed)
PROGRESS NOTE  Kristy Mckinney JSE:[id number] DOB: 03/08/1932 DOA: 12/13/2019 PCP: Celene Squibb, MD   Brief History:  86 year old female with a history of permanent atrial fibrillation, diastolic CHF, GERD, anxiety, dementia, hypertension presenting with a syncopal episode.  The patient states that she was going across her kitchen to put a big potato in the microwave and started feeling dizzy.  The next thing she remembered was waking up on the floor with the potato scattered over the floor.  The patient has denied any recent chest pain, fevers, chills, palpitations, nausea, vomiting, diarrhea, abdominal pain, dysuria.  She recently followed up with her cardiology office on 12/04/2019.  Her torsemide was increased to 20 mg twice daily from 20 mg daily.  She endorses compliance.  However, the patient is not aware of any type of fluid restriction.  She has noted increasing lower extremity edema and just feeling "tired" with ambulation. In the emergency department, the patient was noted to have oxygen saturation of 78% on room air.  She was afebrile hemodynamically stable.  She was placed on supplemental oxygen with oxygen saturation up to 97%.  BMP showed a potassium 3.4 with unremarkable LFTs.  WBC 8.4, hemoglobin 9.4, platelets 304,000.  BNP was 902.  Troponins were unremarkable.  CT of the brain and C-spine were unremarkable.  Chest x-ray showed bilateral pleural effusions, right greater than left.  There is increased interstitial markings.  The patient was started IV furosemide.   Assessment/Plan: Acute respiratory failure with hypoxia -Secondary to pulmonary edema pleural effusion -Currently on 2 L nasal cannula -feels more short of breath today -Wean oxygen for saturation greater 92% -Personally reviewed chest x-ray--bilateral pleural effusions, right greater than left.  Increased interstitial markings   Acute on chronic diastolic CHF, right-sided heart failure with severe pulmonary  hypertension -currently on IV furosemide 40 mg IV twice daily -has good urine output with IV lasix -Renal function slowly improving with diuresis, but now beginning to trend up -will transition to oral torsemide -pt endorses some indiscretion with fluid intake at home -Accurate I's and Os--incomplete -Daily weights -cardiology following -Patient states that last weight at home was 150.4 pounds on 12/13/2019, current weight is 127 lbs -6/7 R-side thoracocentesis--1L removed -feels more short of breath today. Will continue current treatments and monitor.  Acute kidney injury on chronic kidney disease stage IIIa -Likely precipitated by CHF low output -Creatinine improving with diuresis -Continue to monitor renal function.   Syncope -Echo--EF 70-75%, grade 2DD, severe TR, severe elevated PASP -remain on tele -orthostatics -EEG--mild diffuse encephalopathy   Permanent atrial fibrillation -Rate controlled -Continue diltiazem CD -Amiodarone discontinued per cardiology -Metoprolol dosing adjusted for better rate control -Continue apixaban   Cognitive impairment/dementia without behavioral disturbance -Continue Namenda and Seroquel -Continue sertraline   Hypokalemia -Replete -Check magnesium--2.3   Goals of care -palliative medicine consult -frequent hospitalizations -changed to DNR -Palliative care follow-up as an outpatient -Discussed further goals of care with patient's daughter who will assist with further conversations with the patient -Emphasized importance of quality of life which the daughter agrees with. -Daughter agrees with hospice referral on discharge    Status is: Inpatient   The patient will require care spanning > 2 midnights and should be moved to inpatient because: IV treatments appropriate due to intensity of illness or inability to take PO  Volume status appears to be improving. Transition to torsemide   Dispo: The patient is from: Home  Anticipated d/c is to: Home              Anticipated d/c date is: 1 day              Patient currently is not medically stable to d/c.   Family Communication:   Discussed with daughter, [name] over the phone 6/13   Consultants: Cardiology, palliative care   Code Status:  DNR   DVT Prophylaxis: apixaban     Procedures: As Listed in Progress Note Above   Antibiotics: None     Subjective: Feels more short of breath today. Getting short of breath in conversation. No chest pain  Objective: Vitals:   12/20/19 2059 12/21/19 0624 12/21/19 0900 12/21/19 1410  BP: 108/67 111/69  (!) 96/49  Pulse: (!) 106 75  77  Resp: 18 18  20   Temp: 98.2 F (36.8 C) (!) 97.4 F (36.3 C)  98.3 F (36.8 C)  TempSrc: Oral Oral    SpO2: 95% (!) 88% 100% 99%  Weight:      Height:        Intake/Output Summary (Last 24 hours) at 12/21/2019 1930 Last data filed at 12/21/2019 1500 Gross per 24 hour  Intake 480 ml  Output 3800 ml  Net -3320 ml   Weight change:  Exam:  General exam: Alert, awake, oriented x 3 Respiratory system: Crackles at bases. Mild increased resp effort Cardiovascular system:RRR. No murmurs, rubs, gallops. Gastrointestinal system: Abdomen is nondistended, soft and nontender. No organomegaly or masses felt. Normal bowel sounds heard. Central nervous system: Alert and oriented. No focal neurological deficits. Extremities: No C/C/E, +pedal pulses Skin: No rashes, lesions or ulcers Psychiatry: Judgement and insight appear normal. Mood & affect appropriate.     Data Reviewed: I have personally reviewed following labs and imaging studies Basic Metabolic Panel: Recent Labs  Lab 12/15/19 0549 12/16/19 0549 12/17/19 0843 12/18/19 0548 12/19/19 0633 12/20/19 0643 12/21/19 0652  NA 139   < > 136 138 139 142 139  K 3.3*   < > 3.6 4.2 4.2 4.1 3.8  CL 94*   < > 90* 94* 96* 96* 96*  CO2 31   < > 32 34* 32 34* 32  GLUCOSE 116*   < > 164* 110* 102* 105* 104*  BUN 39*   < >  24* 25* 30* 35* 40*  CREATININE 1.89*   < > 1.25* 1.30* 1.29* 1.35* 1.41*  CALCIUM 9.1   < > 8.9 8.8* 9.1 9.5 9.1  MG 2.3  --   --  2.0  --   --   --    < > = values in this interval not displayed.   Liver Function Tests: No results for input(s): AST, ALT, ALKPHOS, BILITOT, PROT, ALBUMIN in the last 168 hours. No results for input(s): LIPASE, AMYLASE in the last 168 hours. No results for input(s): AMMONIA in the last 168 hours. Coagulation Profile: No results for input(s): INR, PROTIME in the last 168 hours. CBC: Recent Labs  Lab 12/19/19 0633  WBC 8.6  HGB 9.8*  HCT 34.3*  MCV 79.8*  PLT 290   Cardiac Enzymes: No results for input(s): CKTOTAL, CKMB, CKMBINDEX, TROPONINI in the last 168 hours. BNP: Invalid input(s): POCBNP CBG: No results for input(s): GLUCAP in the last 168 hours. HbA1C: No results for input(s): HGBA1C in the last 72 hours. Urine analysis:    Component Value Date/Time   COLORURINE YELLOW 09/10/2019 [place] 09/10/2019 1248   LABSPEC  1.018 09/10/2019 1248   PHURINE 6.0 09/10/2019 1248   GLUCOSEU NEGATIVE 09/10/2019 1248   HGBUR NEGATIVE 09/10/2019 1248   BILIRUBINUR NEGATIVE 09/10/2019 1248   [place] 09/10/2019 1248   PROTEINUR 100 (A) 09/10/2019 1248   NITRITE NEGATIVE 09/10/2019 1248   LEUKOCYTESUR NEGATIVE 09/10/2019 1248   Sepsis Labs: @LABRCNTIP (procalcitonin:4,lacticidven:4) ) Recent Results (from the past 240 hour(s))  SARS Coronavirus 2 by RT PCR (hospital order, performed in [facility] hospital lab) Nasopharyngeal Nasopharyngeal Swab     Status: None   Collection Time: 12/13/19 10:01 PM   Specimen: Nasopharyngeal Swab  Result Value Ref Range Status   SARS Coronavirus 2 NEGATIVE NEGATIVE Final    Comment: (NOTE) SARS-CoV-2 target nucleic acids are NOT DETECTED. The SARS-CoV-2 RNA is generally detectable in upper and lower respiratory specimens during the acute phase of infection. The lowest concentration  of SARS-CoV-2 viral copies this assay can detect is 250 copies / mL. A negative result does not preclude SARS-CoV-2 infection and should not be used as the sole basis for treatment or other patient management decisions.  A negative result may occur with improper specimen collection / handling, submission of specimen other than nasopharyngeal swab, presence of viral mutation(s) within the areas targeted by this assay, and inadequate number of viral copies (<250 copies / mL). A negative result must be combined with clinical observations, patient history, and epidemiological information. Fact Sheet for Patients:   StrictlyIdeas.no Fact Sheet for Healthcare Providers: BankingDealers.co.za This test is not yet approved or cleared  by the [place] FDA and has been authorized for detection and/or diagnosis of SARS-CoV-2 by FDA under an Emergency Use Authorization (EUA).  This EUA will remain in effect (meaning this test can be used) for the duration of the COVID-19 declaration under Section 564(b)(1) of the Act, 21 U.S.C. section 360bbb-3(b)(1), unless the authorization is terminated or revoked sooner. Performed at [facility], [address] [place], [place] [address]   Culture, body fluid-bottle     Status: None   Collection Time: 12/15/19  1:05 PM   Specimen: Fluid  Result Value Ref Range Status   Specimen Description FLUID PLEURAL COLLECTED BY DOCTOR  Final   Special Requests BOTTLES DRAWN AEROBIC AND ANAEROBIC 10CC EACH  Final   Culture   Final    NO GROWTH 5 DAYS Performed at [facility], [address] [place], [place] [address]    Report Status 12/20/2019 FINAL  Final  Gram stain     Status: None   Collection Time: 12/15/19  1:05 PM   Specimen: Pleura  Result Value Ref Range Status   Specimen Description PLEURAL  Final   Special Requests PLEURAL  Final   Gram Stain   Final    CYTOSPIN SMEAR WBC PRESENT, PREDOMINANTLY  MONONUCLEAR NO ORGANISMS SEEN Performed at [facility] Performed at [facility], [address] [place], [place] [address]    Report Status 12/15/2019 FINAL  Final     Scheduled Meds: . apixaban  2.5 mg Oral BID  . diltiazem  180 mg Oral Daily  . memantine  5 mg Oral BID  . metoprolol tartrate  25 mg Oral BID  . potassium chloride  40 mEq Oral Daily  . QUEtiapine  25 mg Oral QHS  . sertraline  50 mg Oral QHS  . sodium chloride flush  3 mL Intravenous Q12H  . torsemide  20 mg Oral BID   Continuous Infusions: . sodium chloride      Procedures/Studies:  DG Chest 1 View  Result Date: 12/15/2019 CLINICAL DATA:  Post RIGHT thoracentesis EXAM: CHEST  1 VIEW COMPARISON:  12/13/2019 FINDINGS: Enlargement of cardiac silhouette with mitral annular calcification. Atherosclerotic calcification aorta. Pulmonary vascular congestion. Small bibasilar pleural effusions and atelectasis decreased on RIGHT since prior exam post thoracentesis. Mild perihilar edema. No pneumothorax. Bones demineralized. IMPRESSION: No pneumothorax following RIGHT thoracentesis. Enlargement of cardiac silhouette with pulmonary vascular congestion and mild pulmonary edema. Bibasilar effusions and atelectasis decreased on RIGHT since prior study Electronically Signed   By: Lavonia Dana M.D.   On: 12/15/2019 13:32   CT Head Wo Contrast  Result Date: 12/13/2019 CLINICAL DATA:  [place], back pain EXAM: CT HEAD WITHOUT CONTRAST CT CERVICAL SPINE WITHOUT CONTRAST TECHNIQUE: Multidetector CT imaging of the head and cervical spine was performed following the standard protocol without intravenous contrast. Multiplanar CT image reconstructions of the cervical spine were also generated. COMPARISON:  11/28/2018 FINDINGS: CT HEAD FINDINGS Brain: Chronic small vessel ischemic changes are seen within the periventricular white matter. No acute infarct or hemorrhage. Lateral ventricles and midline structures are unremarkable. No acute  extra-axial fluid collections. No mass effect. Vascular: No hyperdense vessel or unexpected calcification. Skull: There is a small ex of little scalp hematoma. No underlying fracture. Remainder of the calvarium is unremarkable. Sinuses/Orbits: No acute finding. Other: None. CT CERVICAL SPINE FINDINGS Alignment: Alignment is grossly anatomic. Skull base and vertebrae: No acute displaced fractures. Soft tissues and spinal canal: No prevertebral fluid or swelling. No visible canal hematoma. Disc levels: Disc space narrowing and osteophyte formation is seen from C3-4 through C6-7, most pronounced at the C5-6 level. At C3-4 there is minimal symmetrical neural foraminal encroachment. At C4-5 there is mild central stenosis with bilateral right greater than left neural foraminal encroachment. At C5-6 there is at least moderate to severe central canal stenosis with significant right greater than left neural foraminal encroachment. At C6-7 there is mild central stenosis with severe right greater than left neural foraminal encroachment. Upper chest: Airway is patent. Large right pleural effusion partially visualized. Left apex is clear. Other: Reconstructed images demonstrate no additional findings. IMPRESSION: 1. No acute intracranial process. 2. No acute cervical spine fracture. 3. Significant multilevel cervical spondylosis, most pronounced at C5-6. Significant but less severe changes are seen at C4-5 and C6-7 as above. 4. Large right pleural effusion. Electronically Signed   By: Randa Ngo M.D.   On: 12/13/2019 23:22   CT Cervical Spine Wo Contrast  Result Date: 12/13/2019 CLINICAL DATA:  [place], back pain EXAM: CT HEAD WITHOUT CONTRAST CT CERVICAL SPINE WITHOUT CONTRAST TECHNIQUE: Multidetector CT imaging of the head and cervical spine was performed following the standard protocol without intravenous contrast. Multiplanar CT image reconstructions of the cervical spine were also generated. COMPARISON:  11/28/2018  FINDINGS: CT HEAD FINDINGS Brain: Chronic small vessel ischemic changes are seen within the periventricular white matter. No acute infarct or hemorrhage. Lateral ventricles and midline structures are unremarkable. No acute extra-axial fluid collections. No mass effect. Vascular: No hyperdense vessel or unexpected calcification. Skull: There is a small ex of little scalp hematoma. No underlying fracture. Remainder of the calvarium is unremarkable. Sinuses/Orbits: No acute finding. Other: None. CT CERVICAL SPINE FINDINGS Alignment: Alignment is grossly anatomic. Skull base and vertebrae: No acute displaced fractures. Soft tissues and spinal canal: No prevertebral fluid or swelling. No visible canal hematoma. Disc levels: Disc space narrowing and osteophyte formation is seen from C3-4 through C6-7, most pronounced at the C5-6 level. At C3-4  there is minimal symmetrical neural foraminal encroachment. At C4-5 there is mild central stenosis with bilateral right greater than left neural foraminal encroachment. At C5-6 there is at least moderate to severe central canal stenosis with significant right greater than left neural foraminal encroachment. At C6-7 there is mild central stenosis with severe right greater than left neural foraminal encroachment. Upper chest: Airway is patent. Large right pleural effusion partially visualized. Left apex is clear. Other: Reconstructed images demonstrate no additional findings. IMPRESSION: 1. No acute intracranial process. 2. No acute cervical spine fracture. 3. Significant multilevel cervical spondylosis, most pronounced at C5-6. Significant but less severe changes are seen at C4-5 and C6-7 as above. 4. Large right pleural effusion. Electronically Signed   By: Randa Ngo M.D.   On: 12/13/2019 23:22   DG Pelvis Portable  Result Date: 12/13/2019 CLINICAL DATA:  [place], back pain EXAM: PORTABLE PELVIS 1-2 VIEWS COMPARISON:  None. FINDINGS: Single frontal view of the pelvis  demonstrates postsurgical changes right hip. No acute displaced fracture. Left convex scoliosis lumbar spine. Sacroiliac joints are normal. Soft tissues are unremarkable. IMPRESSION: 1. No acute displaced fracture. Electronically Signed   By: Randa Ngo M.D.   On: 12/13/2019 22:46   DG Chest Portable 1 View  Result Date: 12/13/2019 CLINICAL DATA:  [place], pain EXAM: PORTABLE CHEST 1 VIEW COMPARISON:  10/13/2019 FINDINGS: Single frontal view of the chest demonstrates an enlarged right pleural effusion with right basilar atelectasis. Left basilar consolidation unchanged. No pneumothorax. Cardiac silhouette is stable. Atherosclerosis aortic arch. No acute displaced fractures. IMPRESSION: 1. Enlarging right pleural effusion. 2. Persistent bibasilar areas of consolidation likely atelectasis, increased on the right since prior study. Electronically Signed   By: Randa Ngo M.D.   On: 12/13/2019 22:43   EEG adult  Result Date: 12/15/2019 Lora Havens, MD     12/15/2019  2:20 PM Patient Name: DOMINICK ZERTUCHE MRN: [id number] Epilepsy Attending: Lora Havens Referring Physician/Provider: Dr. Shanon Brow Tat Date: 12/15/2019 Duration: 23.39 minutes Patient history: 89 old female with syncope.  EEG evaluate for seizures. Level of alertness: Awake AEDs during EEG study: None Technical aspects: This EEG study was done with scalp electrodes positioned according to the 10-20 International system of electrode placement. Electrical activity was acquired at a sampling rate of 500Hz  and reviewed with a high frequency filter of 70Hz  and a low frequency filter of 1Hz . EEG data were recorded continuously and digitally stored. Description: The posterior dominant rhythm consists of 8 Hz activity of moderate voltage (25-35 uV) seen predominantly in posterior head regions, symmetric and reactive to eye opening and eye closing. EEG showed intermittent generalized 3 to 6 Hz theta-delta slowing. Hyperventilation and photic stimulation  were not performed.   ABNORMALITY -Intermittent slow, generalized IMPRESSION: This study is suggestive of mild diffuse encephalopathy, nonspecific etiology. No seizures or epileptiform discharges were seen throughout the recording. Lora Havens   ECHOCARDIOGRAM COMPLETE  Result Date: 12/14/2019    ECHOCARDIOGRAM REPORT   Patient Name:   ALLANNAH KEMPEN Date of Exam: 12/14/2019 Medical Rec #:  [id number]         Height:       64.5 in Accession #:    [id number]        Weight:       142.0 lb Date of Birth:  01/09/1932         BSA:          1.700 m Patient Age:    15 years  BP:           111/66 mmHg Patient Gender: F                 HR:           101 bpm. Exam Location:  [facility] Procedure: 2D Echo, Cardiac Doppler and Color Doppler Indications:    CHF  History:        Patient has prior history of Echocardiogram examinations, most                 recent 04/04/2019. CHF, Arrythmias:Atrial Fibrillation,                 Signs/Symptoms:Dyspnea; Risk Factors:Hypertension, Dyslipidemia                 and Dementia.  Sonographer:    Dustin Flock RDCS Referring Phys: [phone] DAVID TAT IMPRESSIONS  1. Left ventricular ejection fraction, by estimation, is 70 to 75%. The left ventricle has hyperdynamic function. The left ventricle has no regional wall motion abnormalities. Left ventricular diastolic parameters are consistent with Grade II diastolic dysfunction (pseudonormalization). Elevated left atrial pressure.  2. Right ventricular systolic function is normal. The right ventricular size is normal. There is severely elevated pulmonary artery systolic pressure. The estimated right ventricular systolic pressure is 92.3 mmHg.  3. Left atrial size was moderately dilated.  4. Right atrial size was mildly dilated.  5. The mitral valve is degenerative. Trivial mitral valve regurgitation. Moderate mitral stenosis. The mean mitral valve gradient is 8.0 mmHg.  6. Tricuspid valve regurgitation is severe.  7. The aortic  valve is tricuspid. Aortic valve regurgitation is not visualized. Mild to moderate aortic valve sclerosis/calcification is present, without any evidence of aortic stenosis. Aortic valve mean gradient measures 7.0 mmHg. Aortic valve Vmax measures 1.76 m/s.  8. The inferior vena cava is normal in size with <50% respiratory variability, suggesting right atrial pressure of 8 mmHg. Comparison(s): No significant change from prior study. Prior images reviewed side by side. FINDINGS  Left Ventricle: Left ventricular ejection fraction, by estimation, is 70 to 75%. The left ventricle has hyperdynamic function. The left ventricle has no regional wall motion abnormalities. The left ventricular internal cavity size was normal in size. There is no left ventricular hypertrophy. Left ventricular diastolic parameters are consistent with Grade II diastolic dysfunction (pseudonormalization). Elevated left atrial pressure. Right Ventricle: The right ventricular size is normal. No increase in right ventricular wall thickness. Right ventricular systolic function is normal. There is severely elevated pulmonary artery systolic pressure. The tricuspid regurgitant velocity is 4.21 m/s, and with an assumed right atrial pressure of 3 mmHg, the estimated right ventricular systolic pressure is 30.0 mmHg. Left Atrium: Left atrial size was moderately dilated. Right Atrium: Right atrial size was mildly dilated. Pericardium: There is no evidence of pericardial effusion. Mitral Valve: The mitral valve is degenerative in appearance. There is severe thickening of the mitral valve leaflet(s). There is severe calcification of the mitral valve leaflet(s). Normal mobility of the mitral valve leaflets. Severe mitral annular calcification. Trivial mitral valve regurgitation. Moderate mitral valve stenosis. MV peak gradient, 17.6 mmHg. The mean mitral valve gradient is 8.0 mmHg. Tricuspid Valve: The tricuspid valve is normal in structure. Tricuspid valve  regurgitation is severe. No evidence of tricuspid stenosis. Aortic Valve: The aortic valve is tricuspid. Aortic valve regurgitation is not visualized. Mild to moderate aortic valve sclerosis/calcification is present, without any evidence of aortic stenosis. Aortic valve mean gradient measures 7.0 mmHg.  Aortic valve peak gradient measures 12.4 mmHg. Aortic valve area, by VTI measures 2.21 cm. Pulmonic Valve: The pulmonic valve was normal in structure. Pulmonic valve regurgitation is mild. No evidence of pulmonic stenosis. Aorta: The aortic root is normal in size and structure. Venous: The inferior vena cava is normal in size with less than 50% respiratory variability, suggesting right atrial pressure of 8 mmHg. IAS/Shunts: No atrial level shunt detected by color flow Doppler.  LEFT VENTRICLE PLAX 2D LVIDd:         3.29 cm  Diastology LVIDs:         2.34 cm  LV e' lateral:   7.10 cm/s LV PW:         1.06 cm  LV E/e' lateral: 25.5 LV IVS:        0.96 cm  LV e' medial:    3.42 cm/s LVOT diam:     2.00 cm  LV E/e' medial:  52.9 LV SV:         74 LV SV Index:   44 LVOT Area:     3.14 cm  RIGHT VENTRICLE RV Basal diam:  3.47 cm RV S prime:     5.98 cm/s TAPSE (M-mode): 2.1 cm LEFT ATRIUM             Index       RIGHT ATRIUM           Index LA diam:        5.40 cm 3.18 cm/m  RA Area:     19.10 cm LA Vol (A2C):   77.1 ml 45.34 ml/m RA Volume:   53.00 ml  31.17 ml/m LA Vol (A4C):   79.3 ml 46.64 ml/m LA Biplane Vol: 82.6 ml 48.58 ml/m  AORTIC VALVE AV Area (Vmax):    1.77 cm AV Area (Vmean):   1.79 cm AV Area (VTI):     2.21 cm AV Vmax:           176.00 cm/s AV Vmean:          121.000 cm/s AV VTI:            0.336 m AV Peak Grad:      12.4 mmHg AV Mean Grad:      7.0 mmHg LVOT Vmax:         98.90 cm/s LVOT Vmean:        68.900 cm/s LVOT VTI:          0.236 m LVOT/AV VTI ratio: 0.70  AORTA Ao Root diam: 2.90 cm MITRAL VALVE                TRICUSPID VALVE MV Area (PHT): 3.42 cm     TR Peak grad:   70.9 mmHg MV Peak  grad:  17.6 mmHg    TR Vmax:        421.00 cm/s MV Mean grad:  8.0 mmHg MV Vmax:       2.10 m/s     SHUNTS MV Vmean:      128.5 cm/s   Systemic VTI:  0.24 m MV Decel Time: 222 msec     Systemic Diam: 2.00 cm MV E velocity: 181.00 cm/s MV A velocity: 66.30 cm/s MV E/A ratio:  2.73 Candee Furbish MD Electronically signed by Candee Furbish MD Signature Date/Time: 12/14/2019/12:34:52 PM    Final    US THORACENTESIS ASP PLEURAL SPACE W/IMG GUIDE  Result Date: 12/15/2019 INDICATION: Recurrent RIGHT pleural effusion EXAM: ULTRASOUND GUIDED DIAGNOSTIC AND THERAPEUTIC  RIGHT THORACENTESIS MEDICATIONS: None. COMPLICATIONS: None immediate. PROCEDURE: An ultrasound guided thoracentesis was thoroughly discussed with the patient and questions answered. The benefits, risks, alternatives and complications were also discussed. The patient understands and wishes to proceed with the procedure. Written consent was obtained. Ultrasound was performed to localize and mark an adequate pocket of fluid in the RIGHT chest. The area was then prepped and draped in the normal sterile fashion. 1% Lidocaine was used for local anesthesia. Under ultrasound guidance a 8 French thoracentesis catheter was introduced. Thoracentesis was performed. The catheter was removed and a dressing applied. FINDINGS: A total of approximately 1 L of amber colored RIGHT pleural fluid was removed. Samples were sent to the laboratory as requested by the clinical team. IMPRESSION: Successful ultrasound guided RIGHT thoracentesis yielding 1 L of pleural fluid. Electronically Signed   By: Lavonia Dana M.D.   On: 12/15/2019 13:44    Kathie Dike, MD  [facility]  If 7PM-7AM, please contact [email or web address] [email or web address]  12/21/2019, 7:30 PM   LOS: 7 days

## 2019-12-22 ENCOUNTER — Other Ambulatory Visit: Payer: Self-pay

## 2019-12-22 ENCOUNTER — Telehealth: Payer: Self-pay

## 2019-12-22 LAB — BASIC METABOLIC PANEL
Anion gap: 8 (ref 5–15)
BUN: 41 mg/dL — ABNORMAL HIGH (ref 8–23)
CO2: 34 mmol/L — ABNORMAL HIGH (ref 22–32)
Calcium: 8.8 mg/dL — ABNORMAL LOW (ref 8.9–10.3)
Chloride: 96 mmol/L — ABNORMAL LOW (ref 98–111)
Creatinine, Ser: 1.61 mg/dL — ABNORMAL HIGH (ref 0.44–1.00)
GFR calc Af Amer: 33 mL/min — ABNORMAL LOW (ref >60)
GFR calc non Af Amer: 29 mL/min — ABNORMAL LOW (ref >60)
Glucose, Bld: 107 mg/dL — ABNORMAL HIGH (ref 70–99)
Potassium: 4 mmol/L (ref 3.5–5.1)
Sodium: 138 mmol/L (ref 135–145)

## 2019-12-22 NOTE — Progress Notes (Signed)
PROGRESS NOTE  Kristy Mckinney BOF:[id number] DOB: 07-28-1931 DOA: 12/13/2019 PCP: Celene Squibb, MD   Brief History:  86 year old female with a history of permanent atrial fibrillation, diastolic CHF, GERD, anxiety, dementia, hypertension presenting with a syncopal episode.  The patient states that she was going across her kitchen to put a big potato in the microwave and started feeling dizzy.  The next thing she remembered was waking up on the floor with the potato scattered over the floor.  The patient has denied any recent chest pain, fevers, chills, palpitations, nausea, vomiting, diarrhea, abdominal pain, dysuria.  She recently followed up with her cardiology office on 12/04/2019.  Her torsemide was increased to 20 mg twice daily from 20 mg daily.  She endorses compliance.  However, the patient is not aware of any type of fluid restriction.  She has noted increasing lower extremity edema and just feeling "tired" with ambulation. In the emergency department, the patient was noted to have oxygen saturation of 78% on room air.  She was afebrile hemodynamically stable.  She was placed on supplemental oxygen with oxygen saturation up to 97%.  BMP showed a potassium 3.4 with unremarkable LFTs.  WBC 8.4, hemoglobin 9.4, platelets 304,000.  BNP was 902.  Troponins were unremarkable.  CT of the brain and C-spine were unremarkable.  Chest x-ray showed bilateral pleural effusions, right greater than left.  There is increased interstitial markings.  The patient was started IV furosemide.   Assessment/Plan: Acute respiratory failure with hypoxia -Secondary to pulmonary edema pleural effusion -Currently on 2 L nasal cannula -feels more short of breath today -Wean oxygen for saturation greater 92% -Personally reviewed chest x-ray--bilateral pleural effusions, right greater than left.  Increased interstitial markings   Acute on chronic diastolic CHF, right-sided heart failure with severe pulmonary  hypertension -currently on IV furosemide 40 mg IV twice daily -has good urine output with IV lasix -Renal function slowly improving with diuresis, but now beginning to trend up -will transition to oral torsemide -pt endorses some indiscretion with fluid intake at home -Accurate I's and Os--incomplete -Daily weights -cardiology following -Patient states that last weight at home was 150.4 pounds on 12/13/2019, current weight is 127 lbs -6/7 R-side thoracocentesis--1L removed -feels more short of breath today. Will continue current treatments and monitor.  Acute kidney injury on chronic kidney disease stage IIIa -Likely precipitated by CHF low output -Creatinine improving with diuresis -Continue to monitor renal function.   Syncope -Echo--EF 70-75%, grade 2DD, severe TR, severe elevated PASP -remain on tele -orthostatics -EEG--mild diffuse encephalopathy   Permanent atrial fibrillation -Rate controlled -Continue diltiazem CD -Amiodarone discontinued per cardiology -Metoprolol dosing adjusted for better rate control -Continue apixaban   Cognitive impairment/dementia without behavioral disturbance -Continue Namenda and Seroquel -Continue sertraline   Hypokalemia -Replete -Check magnesium--2.3   Goals of care -palliative medicine consult -frequent hospitalizations -changed to DNR -Palliative care follow-up as an outpatient -Discussed further goals of care with patient's daughter who will assist with further conversations with the patient -Emphasized importance of quality of life which the daughter agrees with. -Daughter agrees with hospice referral on discharge    Status is: Inpatient   The patient will require care spanning > 2 midnights and should be moved to inpatient because: IV treatments appropriate due to intensity of illness or inability to take PO  Volume status appears to be improving. Transition to torsemide   Dispo: The patient is from: Home  Anticipated d/c is to: Home with hospice in a.m.              Anticipated d/c date is: 1 day              Patient currently is not medically stable to d/c.   Family Communication:   Discussed with daughter, Kristy Mckinney over the phone 6/13   Consultants: Cardiology, palliative care   Code Status:  DNR   DVT Prophylaxis: apixaban     Procedures: As Listed in Progress Note Above   Antibiotics: None     Subjective: -Dyspnea persist, plan is for discharge home with hospice in a.m.  Objective: Vitals:   12/21/19 2100 12/22/19 0503 12/22/19 0808 12/22/19 1347  BP: (!) 115/56 (!) 108/55 104/61 (!) 93/49  Pulse: 93 83 85 72  Resp: 18 20 16 17   Temp: 98.6 F (37 C) 98 F (36.7 C) 98 F (36.7 C) 98.1 F (36.7 C)  TempSrc: Oral Oral Oral Oral  SpO2: 98% 100% 97% 98%  Weight:  58.1 kg    Height:        Intake/Output Summary (Last 24 hours) at 12/22/2019 2019 Last data filed at 12/22/2019 0516 Gross per 24 hour  Intake 123 ml  Output 400 ml  Net -277 ml   Weight change:  Exam:  General exam: Alert, awake, oriented x 3 Respiratory system: Crackles at bases. Mild increased resp effort Cardiovascular system:RRR. No murmurs, rubs, gallops. Gastrointestinal system: Abdomen is nondistended, soft and nontender. No organomegaly or masses felt. Normal bowel sounds heard. Central nervous system: Alert and oriented. No focal neurological deficits. Extremities: No C/C/E, +pedal pulses Skin: No rashes, lesions or ulcers Psychiatry: Judgement and insight appear normal. Mood & affect appropriate.     Data Reviewed: I have personally reviewed following labs and imaging studies Basic Metabolic Panel: Recent Labs  Lab 12/18/19 0548 12/19/19 0633 12/20/19 0643 12/21/19 0652 12/22/19 0544  NA 138 139 142 139 138  K 4.2 4.2 4.1 3.8 4.0  CL 94* 96* 96* 96* 96*  CO2 34* 32 34* 32 34*  GLUCOSE 110* 102* 105* 104* 107*  BUN 25* 30* 35* 40* 41*  CREATININE 1.30* 1.29* 1.35* 1.41* 1.61*    CALCIUM 8.8* 9.1 9.5 9.1 8.8*  MG 2.0  --   --   --   --    Liver Function Tests: No results for input(s): AST, ALT, ALKPHOS, BILITOT, PROT, ALBUMIN in the last 168 hours. No results for input(s): LIPASE, AMYLASE in the last 168 hours. No results for input(s): AMMONIA in the last 168 hours. Coagulation Profile: No results for input(s): INR, PROTIME in the last 168 hours. CBC: Recent Labs  Lab 12/19/19 0633  WBC 8.6  HGB 9.8*  HCT 34.3*  MCV 79.8*  PLT 290   Cardiac Enzymes: No results for input(s): CKTOTAL, CKMB, CKMBINDEX, TROPONINI in the last 168 hours. BNP: Invalid input(s): POCBNP CBG: No results for input(s): GLUCAP in the last 168 hours. HbA1C: No results for input(s): HGBA1C in the last 72 hours. Urine analysis:    Component Value Date/Time   COLORURINE YELLOW 09/10/2019 1248   APPEARANCEUR CLEAR 09/10/2019 1248   LABSPEC 1.018 09/10/2019 1248   PHURINE 6.0 09/10/2019 1248   GLUCOSEU NEGATIVE 09/10/2019 1248   HGBUR NEGATIVE 09/10/2019 1248   BILIRUBINUR NEGATIVE 09/10/2019 1248   KETONESUR NEGATIVE 09/10/2019 1248   PROTEINUR 100 (A) 09/10/2019 1248   NITRITE NEGATIVE 09/10/2019 1248   LEUKOCYTESUR NEGATIVE 09/10/2019 1248   Sepsis  Labs: @LABRCNTIP (procalcitonin:4,lacticidven:4) ) Recent Results (from the past 240 hour(s))  SARS Coronavirus 2 by RT PCR (hospital order, performed in [facility] hospital lab) Nasopharyngeal Nasopharyngeal Swab     Status: None   Collection Time: 12/13/19 10:01 PM   Specimen: Nasopharyngeal Swab  Result Value Ref Range Status   SARS Coronavirus 2 NEGATIVE NEGATIVE Final    Comment: (NOTE) SARS-CoV-2 target nucleic acids are NOT DETECTED. The SARS-CoV-2 RNA is generally detectable in upper and lower respiratory specimens during the acute phase of infection. The lowest concentration of SARS-CoV-2 viral copies this assay can detect is 250 copies / mL. A negative result does not preclude SARS-CoV-2 infection and should not  be used as the sole basis for treatment or other patient management decisions.  A negative result may occur with improper specimen collection / handling, submission of specimen other than nasopharyngeal swab, presence of viral mutation(s) within the areas targeted by this assay, and inadequate number of viral copies (<250 copies / mL). A negative result must be combined with clinical observations, patient history, and epidemiological information. Fact Sheet for Patients:   StrictlyIdeas.no Fact Sheet for Healthcare Providers: BankingDealers.co.za This test is not yet approved or cleared  by the [place] FDA and has been authorized for detection and/or diagnosis of SARS-CoV-2 by FDA under an Emergency Use Authorization (EUA).  This EUA will remain in effect (meaning this test can be used) for the duration of the COVID-19 declaration under Section 564(b)(1) of the Act, 21 U.S.C. section 360bbb-3(b)(1), unless the authorization is terminated or revoked sooner. Performed at [facility], [address] [place], [place] [address]   Culture, body fluid-bottle     Status: None   Collection Time: 12/15/19  1:05 PM   Specimen: Fluid  Result Value Ref Range Status   Specimen Description FLUID PLEURAL COLLECTED BY DOCTOR  Final   Special Requests BOTTLES DRAWN AEROBIC AND ANAEROBIC 10CC EACH  Final   Culture   Final    NO GROWTH 5 DAYS Performed at [facility], [address], [place], [place] [address]    Report Status 12/20/2019 FINAL  Final  Gram stain     Status: None   Collection Time: 12/15/19  1:05 PM   Specimen: Pleura  Result Value Ref Range Status   Specimen Description PLEURAL  Final   Special Requests PLEURAL  Final   Gram Stain   Final    CYTOSPIN SMEAR WBC PRESENT, PREDOMINANTLY MONONUCLEAR NO ORGANISMS SEEN Performed at [facility] Performed at [facility], [address], [place], [place] [address]    Report  Status 12/15/2019 FINAL  Final     Scheduled Meds:  apixaban  2.5 mg Oral BID   diltiazem  180 mg Oral Daily   memantine  5 mg Oral BID   metoprolol tartrate  25 mg Oral BID   potassium chloride  40 mEq Oral Daily   QUEtiapine  25 mg Oral QHS   sertraline  50 mg Oral QHS   sodium chloride flush  3 mL Intravenous Q12H   torsemide  20 mg Oral BID   Continuous Infusions:  sodium chloride      Procedures/Studies: DG Chest 1 View  Result Date: 12/15/2019 CLINICAL DATA:  Post RIGHT thoracentesis EXAM: CHEST  1 VIEW COMPARISON:  12/13/2019 FINDINGS: Enlargement of cardiac silhouette with mitral annular calcification. Atherosclerotic calcification aorta. Pulmonary vascular congestion. Small bibasilar pleural effusions and atelectasis decreased on RIGHT since prior exam post thoracentesis. Mild perihilar edema. No  pneumothorax. Bones demineralized. IMPRESSION: No pneumothorax following RIGHT thoracentesis. Enlargement of cardiac silhouette with pulmonary vascular congestion and mild pulmonary edema. Bibasilar effusions and atelectasis decreased on RIGHT since prior study Electronically Signed   By: Lavonia Dana M.D.   On: 12/15/2019 13:32   CT Head Wo Contrast  Result Date: 12/13/2019 CLINICAL DATA:  [place], back pain EXAM: CT HEAD WITHOUT CONTRAST CT CERVICAL SPINE WITHOUT CONTRAST TECHNIQUE: Multidetector CT imaging of the head and cervical spine was performed following the standard protocol without intravenous contrast. Multiplanar CT image reconstructions of the cervical spine were also generated. COMPARISON:  11/28/2018 FINDINGS: CT HEAD FINDINGS Brain: Chronic small vessel ischemic changes are seen within the periventricular white matter. No acute infarct or hemorrhage. Lateral ventricles and midline structures are unremarkable. No acute extra-axial fluid collections. No mass effect. Vascular: No hyperdense vessel or unexpected calcification. Skull: There is a small ex of little scalp  hematoma. No underlying fracture. Remainder of the calvarium is unremarkable. Sinuses/Orbits: No acute finding. Other: None. CT CERVICAL SPINE FINDINGS Alignment: Alignment is grossly anatomic. Skull base and vertebrae: No acute displaced fractures. Soft tissues and spinal canal: No prevertebral fluid or swelling. No visible canal hematoma. Disc levels: Disc space narrowing and osteophyte formation is seen from C3-4 through C6-7, most pronounced at the C5-6 level. At C3-4 there is minimal symmetrical neural foraminal encroachment. At C4-5 there is mild central stenosis with bilateral right greater than left neural foraminal encroachment. At C5-6 there is at least moderate to severe central canal stenosis with significant right greater than left neural foraminal encroachment. At C6-7 there is mild central stenosis with severe right greater than left neural foraminal encroachment. Upper chest: Airway is patent. Large right pleural effusion partially visualized. Left apex is clear. Other: Reconstructed images demonstrate no additional findings. IMPRESSION: 1. No acute intracranial process. 2. No acute cervical spine fracture. 3. Significant multilevel cervical spondylosis, most pronounced at C5-6. Significant but less severe changes are seen at C4-5 and C6-7 as above. 4. Large right pleural effusion. Electronically Signed   By: Randa Ngo M.D.   On: 12/13/2019 23:22   CT Cervical Spine Wo Contrast  Result Date: 12/13/2019 CLINICAL DATA:  [place], back pain EXAM: CT HEAD WITHOUT CONTRAST CT CERVICAL SPINE WITHOUT CONTRAST TECHNIQUE: Multidetector CT imaging of the head and cervical spine was performed following the standard protocol without intravenous contrast. Multiplanar CT image reconstructions of the cervical spine were also generated. COMPARISON:  11/28/2018 FINDINGS: CT HEAD FINDINGS Brain: Chronic small vessel ischemic changes are seen within the periventricular white matter. No acute infarct or hemorrhage.  Lateral ventricles and midline structures are unremarkable. No acute extra-axial fluid collections. No mass effect. Vascular: No hyperdense vessel or unexpected calcification. Skull: There is a small ex of little scalp hematoma. No underlying fracture. Remainder of the calvarium is unremarkable. Sinuses/Orbits: No acute finding. Other: None. CT CERVICAL SPINE FINDINGS Alignment: Alignment is grossly anatomic. Skull base and vertebrae: No acute displaced fractures. Soft tissues and spinal canal: No prevertebral fluid or swelling. No visible canal hematoma. Disc levels: Disc space narrowing and osteophyte formation is seen from C3-4 through C6-7, most pronounced at the C5-6 level. At C3-4 there is minimal symmetrical neural foraminal encroachment. At C4-5 there is mild central stenosis with bilateral right greater than left neural foraminal encroachment. At C5-6 there is at least moderate to severe central canal stenosis with significant right greater than left neural foraminal encroachment. At C6-7 there is mild central stenosis with severe right greater  than left neural foraminal encroachment. Upper chest: Airway is patent. Large right pleural effusion partially visualized. Left apex is clear. Other: Reconstructed images demonstrate no additional findings. IMPRESSION: 1. No acute intracranial process. 2. No acute cervical spine fracture. 3. Significant multilevel cervical spondylosis, most pronounced at C5-6. Significant but less severe changes are seen at C4-5 and C6-7 as above. 4. Large right pleural effusion. Electronically Signed   By: Randa Ngo M.D.   On: 12/13/2019 23:22   DG Pelvis Portable  Result Date: 12/13/2019 CLINICAL DATA:  [place], back pain EXAM: PORTABLE PELVIS 1-2 VIEWS COMPARISON:  None. FINDINGS: Single frontal view of the pelvis demonstrates postsurgical changes right hip. No acute displaced fracture. Left convex scoliosis lumbar spine. Sacroiliac joints are normal. Soft tissues are  unremarkable. IMPRESSION: 1. No acute displaced fracture. Electronically Signed   By: Randa Ngo M.D.   On: 12/13/2019 22:46   DG Chest Portable 1 View  Result Date: 12/13/2019 CLINICAL DATA:  [place], pain EXAM: PORTABLE CHEST 1 VIEW COMPARISON:  10/13/2019 FINDINGS: Single frontal view of the chest demonstrates an enlarged right pleural effusion with right basilar atelectasis. Left basilar consolidation unchanged. No pneumothorax. Cardiac silhouette is stable. Atherosclerosis aortic arch. No acute displaced fractures. IMPRESSION: 1. Enlarging right pleural effusion. 2. Persistent bibasilar areas of consolidation likely atelectasis, increased on the right since prior study. Electronically Signed   By: Randa Ngo M.D.   On: 12/13/2019 22:43   EEG adult  Result Date: 12/15/2019 Lora Havens, MD     12/15/2019  2:20 PM Patient Name: Kristy Mckinney MRN: [id number] Epilepsy Attending: Lora Havens Referring Physician/Provider: Dr. Shanon Brow Tat Date: 12/15/2019 Duration: 23.39 minutes Patient history: 30 old female with syncope.  EEG evaluate for seizures. Level of alertness: Awake AEDs during EEG study: None Technical aspects: This EEG study was done with scalp electrodes positioned according to the 10-20 International system of electrode placement. Electrical activity was acquired at a sampling rate of 500Hz  and reviewed with a high frequency filter of 70Hz  and a low frequency filter of 1Hz . EEG data were recorded continuously and digitally stored. Description: The posterior dominant rhythm consists of 8 Hz activity of moderate voltage (25-35 uV) seen predominantly in posterior head regions, symmetric and reactive to eye opening and eye closing. EEG showed intermittent generalized 3 to 6 Hz theta-delta slowing. Hyperventilation and photic stimulation were not performed.   ABNORMALITY -Intermittent slow, generalized IMPRESSION: This study is suggestive of mild diffuse encephalopathy, nonspecific etiology.  No seizures or epileptiform discharges were seen throughout the recording. Lora Havens   ECHOCARDIOGRAM COMPLETE  Result Date: 12/14/2019    ECHOCARDIOGRAM REPORT   Patient Name:   Kristy Mckinney Date of Exam: 12/14/2019 Medical Rec #:  [id number]         Height:       64.5 in Accession #:    [id number]        Weight:       142.0 lb Date of Birth:  1931-08-12         BSA:          1.700 m Patient Age:    62 years          BP:           111/66 mmHg Patient Gender: F                 HR:           101 bpm. Exam Location:  [facility] Procedure: 2D Echo, Cardiac Doppler and Color Doppler Indications:    CHF  History:        Patient has prior history of Echocardiogram examinations, most                 recent 04/04/2019. CHF, Arrythmias:Atrial Fibrillation,                 Signs/Symptoms:Dyspnea; Risk Factors:Hypertension, Dyslipidemia                 and Dementia.  Sonographer:    Dustin Flock RDCS Referring Phys: [phone] DAVID TAT IMPRESSIONS  1. Left ventricular ejection fraction, by estimation, is 70 to 75%. The left ventricle has hyperdynamic function. The left ventricle has no regional wall motion abnormalities. Left ventricular diastolic parameters are consistent with Grade II diastolic dysfunction (pseudonormalization). Elevated left atrial pressure.  2. Right ventricular systolic function is normal. The right ventricular size is normal. There is severely elevated pulmonary artery systolic pressure. The estimated right ventricular systolic pressure is 59.7 mmHg.  3. Left atrial size was moderately dilated.  4. Right atrial size was mildly dilated.  5. The mitral valve is degenerative. Trivial mitral valve regurgitation. Moderate mitral stenosis. The mean mitral valve gradient is 8.0 mmHg.  6. Tricuspid valve regurgitation is severe.  7. The aortic valve is tricuspid. Aortic valve regurgitation is not visualized. Mild to moderate aortic valve sclerosis/calcification is present, without any evidence of  aortic stenosis. Aortic valve mean gradient measures 7.0 mmHg. Aortic valve Vmax measures 1.76 m/s.  8. The inferior vena cava is normal in size with <50% respiratory variability, suggesting right atrial pressure of 8 mmHg. Comparison(s): No significant change from prior study. Prior images reviewed side by side. FINDINGS  Left Ventricle: Left ventricular ejection fraction, by estimation, is 70 to 75%. The left ventricle has hyperdynamic function. The left ventricle has no regional wall motion abnormalities. The left ventricular internal cavity size was normal in size. There is no left ventricular hypertrophy. Left ventricular diastolic parameters are consistent with Grade II diastolic dysfunction (pseudonormalization). Elevated left atrial pressure. Right Ventricle: The right ventricular size is normal. No increase in right ventricular wall thickness. Right ventricular systolic function is normal. There is severely elevated pulmonary artery systolic pressure. The tricuspid regurgitant velocity is 4.21 m/s, and with an assumed right atrial pressure of 3 mmHg, the estimated right ventricular systolic pressure is 41.6 mmHg. Left Atrium: Left atrial size was moderately dilated. Right Atrium: Right atrial size was mildly dilated. Pericardium: There is no evidence of pericardial effusion. Mitral Valve: The mitral valve is degenerative in appearance. There is severe thickening of the mitral valve leaflet(s). There is severe calcification of the mitral valve leaflet(s). Normal mobility of the mitral valve leaflets. Severe mitral annular calcification. Trivial mitral valve regurgitation. Moderate mitral valve stenosis. MV peak gradient, 17.6 mmHg. The mean mitral valve gradient is 8.0 mmHg. Tricuspid Valve: The tricuspid valve is normal in structure. Tricuspid valve regurgitation is severe. No evidence of tricuspid stenosis. Aortic Valve: The aortic valve is tricuspid. Aortic valve regurgitation is not visualized. Mild to  moderate aortic valve sclerosis/calcification is present, without any evidence of aortic stenosis. Aortic valve mean gradient measures 7.0 mmHg. Aortic valve peak gradient measures 12.4 mmHg. Aortic valve area, by VTI measures 2.21 cm. Pulmonic Valve: The pulmonic valve was normal in structure. Pulmonic valve regurgitation is mild. No evidence of pulmonic stenosis. Aorta: The aortic root is normal in size and structure. Venous: The inferior vena  cava is normal in size with less than 50% respiratory variability, suggesting right atrial pressure of 8 mmHg. IAS/Shunts: No atrial level shunt detected by color flow Doppler.  LEFT VENTRICLE PLAX 2D LVIDd:         3.29 cm  Diastology LVIDs:         2.34 cm  LV e' lateral:   7.10 cm/s LV PW:         1.06 cm  LV E/e' lateral: 25.5 LV IVS:        0.96 cm  LV e' medial:    3.42 cm/s LVOT diam:     2.00 cm  LV E/e' medial:  52.9 LV SV:         74 LV SV Index:   44 LVOT Area:     3.14 cm  RIGHT VENTRICLE RV Basal diam:  3.47 cm RV S prime:     5.98 cm/s TAPSE (M-mode): 2.1 cm LEFT ATRIUM             Index       RIGHT ATRIUM           Index LA diam:        5.40 cm 3.18 cm/m  RA Area:     19.10 cm LA Vol (A2C):   77.1 ml 45.34 ml/m RA Volume:   53.00 ml  31.17 ml/m LA Vol (A4C):   79.3 ml 46.64 ml/m LA Biplane Vol: 82.6 ml 48.58 ml/m  AORTIC VALVE AV Area (Vmax):    1.77 cm AV Area (Vmean):   1.79 cm AV Area (VTI):     2.21 cm AV Vmax:           176.00 cm/s AV Vmean:          121.000 cm/s AV VTI:            0.336 m AV Peak Grad:      12.4 mmHg AV Mean Grad:      7.0 mmHg LVOT Vmax:         98.90 cm/s LVOT Vmean:        68.900 cm/s LVOT VTI:          0.236 m LVOT/AV VTI ratio: 0.70  AORTA Ao Root diam: 2.90 cm MITRAL VALVE                TRICUSPID VALVE MV Area (PHT): 3.42 cm     TR Peak grad:   70.9 mmHg MV Peak grad:  17.6 mmHg    TR Vmax:        421.00 cm/s MV Mean grad:  8.0 mmHg MV Vmax:       2.10 m/s     SHUNTS MV Vmean:      128.5 cm/s   Systemic VTI:  0.24 m MV  Decel Time: 222 msec     Systemic Diam: 2.00 cm MV E velocity: 181.00 cm/s MV A velocity: 66.30 cm/s MV E/A ratio:  2.73 Candee Furbish MD Electronically signed by Candee Furbish MD Signature Date/Time: 12/14/2019/12:34:52 PM    Final    US THORACENTESIS ASP PLEURAL SPACE W/IMG GUIDE  Result Date: 12/15/2019 INDICATION: Recurrent RIGHT pleural effusion EXAM: ULTRASOUND GUIDED DIAGNOSTIC AND THERAPEUTIC RIGHT THORACENTESIS MEDICATIONS: None. COMPLICATIONS: None immediate. PROCEDURE: An ultrasound guided thoracentesis was thoroughly discussed with the patient and questions answered. The benefits, risks, alternatives and complications were also discussed. The patient understands and wishes to proceed with the procedure. Written consent was obtained. Ultrasound was performed to  localize and mark an adequate pocket of fluid in the RIGHT chest. The area was then prepped and draped in the normal sterile fashion. 1% Lidocaine was used for local anesthesia. Under ultrasound guidance a 8 French thoracentesis catheter was introduced. Thoracentesis was performed. The catheter was removed and a dressing applied. FINDINGS: A total of approximately 1 L of amber colored RIGHT pleural fluid was removed. Samples were sent to the laboratory as requested by the clinical team. IMPRESSION: Successful ultrasound guided RIGHT thoracentesis yielding 1 L of pleural fluid. Electronically Signed   By: Lavonia Dana M.D.   On: 12/15/2019 13:44    Marquell Saenz Denton Brick, MD  [facility]  If 7PM-7AM, please contact night-coverage [email or web address]  12/22/2019, 8:19 PM   LOS: 8 days

## 2019-12-22 NOTE — Progress Notes (Signed)
Physical Therapy Treatment Patient Details Name: Kristy Mckinney MRN: [id number] DOB: May 18, 1932 Today's Date: 12/22/2019    History of Present Illness 86 year old female with a history of permanent atrial fibrillation, diastolic CHF, GERD, anxiety, dementia, hypertension presenting with a syncopal episode.  The patient states that she was going across her kitchen to put a big potato in the microwave and started feeling dizzy.  The next thing she remembered was waking up on the floor with the potato scattered over the floor.  The patient has denied any recent chest pain, fevers, chills, palpitations, nausea, vomiting, diarrhea, abdominal pain, dysuria.  She recently followed up with her cardiology office on 12/04/2019.  Her torsemide was increased to 20 mg twice daily from 20 mg daily.  She endorses compliance.  However, the patient is not aware of any type of fluid restriction.  She has noted increasing lower extremity edema and just feeling "tired" with ambulation.    PT Comments    Patient demonstrates good return for sitting up at bedside and transferring to commode in bathroom, able lean over sink while standing to wash hands without loss of balance, SpO2 dropped to 83% while on room air, had to use 2 LPM during gait training with SpO2 at 91%, limited for ambulation due to c/o fatigue and tolerated sitting up in chair after therapy - RN notified.  Patient will benefit from continued physical therapy in hospital and recommended venue below to increase strength, balance, endurance for safe ADLs and gait.   Follow Up Recommendations  Home health PT;Supervision for mobility/OOB;Supervision - Intermittent     Equipment Recommendations  3in1 (PT)    Recommendations for Other Services       Precautions / Restrictions Precautions Precautions: Fall Restrictions Weight Bearing Restrictions: No    Mobility  Bed Mobility Overal bed mobility: Needs Assistance Bed Mobility: Supine to Sit      Supine to sit: Modified independent (Device/Increase time);Supervision     General bed mobility comments: slightly increased time  Transfers Overall transfer level: Needs assistance Equipment used: Rolling walker (2 wheeled) Transfers: Sit to/from [employer] Transfers Sit to Stand: Supervision;Min guard Stand pivot transfers: Supervision;Min guard       General transfer comment: increased time, slightly labored movement  Ambulation/Gait Ambulation/Gait assistance: Supervision;Min guard Gait Distance (Feet): 55 Feet Assistive device: Rolling walker (2 wheeled) Gait Pattern/deviations: Decreased step length - right;Decreased step length - left;Decreased stride length Gait velocity: decreased   General Gait Details: slightly labored slow cadence without loss of balance, on 2 LPM with SpO2 at 91%, limited secondary to c/o fatigue   Stairs             Wheelchair Mobility    Modified Rankin (Stroke Patients Only)       Balance Overall balance assessment: Needs assistance Sitting-balance support: Feet supported;No upper extremity supported Sitting balance-Leahy Scale: Good Sitting balance - Comments: seated at EOB   Standing balance support: During functional activity;Bilateral upper extremity supported Standing balance-Leahy Scale: Fair Standing balance comment: fair with RW                            Cognition Arousal/Alertness: Awake/alert Behavior During Therapy: WFL for tasks assessed/performed Overall Cognitive Status: Within Functional Limits for tasks assessed  Exercises      General Comments        Pertinent Vitals/Pain Pain Assessment: No/denies pain    Home Living                      Prior Function            PT Goals (current goals can now be found in the care plan section) Acute Rehab PT Goals Patient Stated Goal: Go home with HHPT. PT Goal  Formulation: With patient/family Time For Goal Achievement: 12/29/19 Potential to Achieve Goals: Good Progress towards PT goals: Progressing toward goals    Frequency    Min 3X/week      PT Plan Current plan remains appropriate    Co-evaluation              AM-PAC PT "6 Clicks" Mobility   Outcome Measure  Help needed turning from your back to your side while in a flat bed without using bedrails?: None Help needed moving from lying on your back to sitting on the side of a flat bed without using bedrails?: None Help needed moving to and from a bed to a chair (including a wheelchair)?: A Little Help needed standing up from a chair using your arms (e.g., wheelchair or bedside chair)?: A Little Help needed to walk in hospital room?: A Little Help needed climbing 3-5 steps with a railing? : A Lot 6 Click Score: 19    End of Session Equipment Utilized During Treatment: Oxygen Activity Tolerance: Patient tolerated treatment well;Patient limited by fatigue Patient left: in chair;with call bell/phone within reach;with chair alarm set Nurse Communication: Mobility status PT Visit Diagnosis: Unsteadiness on feet (R26.81);Other abnormalities of gait and mobility (R26.89);History of falling (Z91.81);Muscle weakness (generalized) (M62.81);Pain     Time: 3825-0539 PT Time Calculation (min) (ACUTE ONLY): 32 min  Charges:  $Gait Training: 8-22 mins $Therapeutic Activity: 8-22 mins                     4:02 PM, 12/22/19 Lonell Grandchild, MPT Physical Therapist with [facility] [phone] office [phone] mobile phone

## 2019-12-22 NOTE — Care Management Important Message (Signed)
Important Message  Patient Details  Name: Kristy Mckinney MRN: [id number] Date of Birth: 05-08-1932   Medicare Important Message Given:  Yes     Tommy Medal 12/22/2019, 1:54 PM

## 2019-12-22 NOTE — Telephone Encounter (Signed)
TC to patient to inquire if patient has returned home from the hospital.  RN LM requesting call back to update RN on status and schedule palliative care home visit.

## 2019-12-22 NOTE — Progress Notes (Addendum)
Progress Note  Patient Name: Kristy Mckinney Date of Encounter: 12/22/2019  [facility] Cardiologist: Rozann Lesches, MD   Subjective   Sleeping with O2 off. Denies complaints  Inpatient Medications    Scheduled Meds: . apixaban  2.5 mg Oral BID  . diltiazem  180 mg Oral Daily  . memantine  5 mg Oral BID  . metoprolol tartrate  25 mg Oral BID  . potassium chloride  40 mEq Oral Daily  . QUEtiapine  25 mg Oral QHS  . sertraline  50 mg Oral QHS  . sodium chloride flush  3 mL Intravenous Q12H  . torsemide  20 mg Oral BID   Continuous Infusions: . sodium chloride     PRN Meds: sodium chloride, acetaminophen, ondansetron (ZOFRAN) IV, sodium chloride flush   Vital Signs    Vitals:   12/21/19 2035 12/21/19 2100 12/22/19 0503 12/22/19 0808  BP:  (!) 115/56 (!) 108/55 104/61  Pulse:  93 83 85  Resp:  18 20 16   Temp:  98.6 F (37 C) 98 F (36.7 C) 98 F (36.7 C)  TempSrc:  Oral Oral Oral  SpO2: 98% 98% 100% 97%  Weight:   58.1 kg   Height:        Intake/Output Summary (Last 24 hours) at 12/22/2019 0918 Last data filed at 12/22/2019 0516 Gross per 24 hour  Intake 603 ml  Output 1600 ml  Net -997 ml   Last 3 Weights 12/22/2019 12/20/2019 12/19/2019  Weight (lbs) 128 lb 1.4 oz 127 lb 10.3 oz 134 lb 4.2 oz  Weight (kg) 58.1 kg 57.9 kg 60.9 kg      Telemetry    Afib with controlled rate - Personally Reviewed  ECG       Physical Exam    GEN: No acute distress.   Neck: No JVD Cardiac: irreg, no murmurs, rubs, or gallops.  Respiratory:decerase breath sounds with scattered rales. GI: Soft, nontender, non-distended  MS: No edema; No deformity. Neuro:  Nonfocal  Psych: Normal affect   Labs    High Sensitivity Troponin:   Recent Labs  Lab 12/13/19 2201 12/13/19 2311  TROPONINIHS 15 14      Chemistry Recent Labs  Lab 12/20/19 0643 12/21/19 0652 12/22/19 0544  NA 142 139 138  K 4.1 3.8 4.0  CL 96* 96* 96*  CO2 34* 32 34*  GLUCOSE 105* 104*  107*  BUN 35* 40* 41*  CREATININE 1.35* 1.41* 1.61*  CALCIUM 9.5 9.1 8.8*  GFRNONAA 36* 34* 29*  GFRAA 41* 39* 33*  ANIONGAP 12 11 8      Hematology Recent Labs  Lab 12/19/19 0633  WBC 8.6  RBC 4.30  HGB 9.8*  HCT 34.3*  MCV 79.8*  MCH 22.8*  MCHC 28.6*  RDW 20.5*  PLT 290    BNPNo results for input(s): BNP, PROBNP in the last 168 hours.   DDimer No results for input(s): DDIMER in the last 168 hours.   Radiology    No results found.  Cardiac Studies   Echocardiogram 12/14/2019: 1. Left ventricular ejection fraction, by estimation, is 70 to 75%. The  left ventricle has hyperdynamic function. The left ventricle has no  regional wall motion abnormalities. Left ventricular diastolic parameters  are consistent with Grade II diastolic  dysfunction (pseudonormalization). Elevated left atrial pressure.   2. Right ventricular systolic function is normal. The right ventricular  size is normal. There is severely elevated pulmonary artery systolic  pressure. The estimated right ventricular systolic  pressure is 73.9 mmHg.   3. Left atrial size was moderately dilated.   4. Right atrial size was mildly dilated.   5. The mitral valve is degenerative. Trivial mitral valve regurgitation.  Moderate mitral stenosis. The mean mitral valve gradient is 8.0 mmHg.   6. Tricuspid valve regurgitation is severe.   7. The aortic valve is tricuspid. Aortic valve regurgitation is not  visualized. Mild to moderate aortic valve sclerosis/calcification is  present, without any evidence of aortic stenosis. Aortic valve mean  gradient measures 7.0 mmHg. Aortic valve Vmax  measures 1.76 m/s.   8. The inferior vena cava is normal in size with <50% respiratory  variability, suggesting right atrial pressure of 8 mmHg.      Patient Profile     86 y.o. female with a history of permanent atrial fibrillation, chronic diastolic heart failure, mitral stenosis, severe pulmonary pretension, GERD, essential  hypertension, and dementia who is being seen today for the evaluation of acute on chronic diastolic heart failure     Assessment & Plan     1.  Acute on chronic diastolic heart failure complicated by persistent atrial fibrillation, moderate mitral stenosis and severe tricuspid regurgitation with severe pulmonary hypertension.  She is status post right-sided thoracentesis 6/7 and now on demadex 20 mg bid. Crt up to 1.61-reluctant to decrease demadex but may need to.   2.  Permanent atrial fibrillation.  She is currently on Cardizem CD, metoprolol  and amiodarone-for rate control  Eliquis for stroke prophylaxis.   3.  Dementia, she has been on Namenda and Seroquel.   4.  Palliative care consultation noted.  Currently DNR.         For questions or updates, please contact [place] Please consult [email or web address] for contact info under        Signed, Ermalinda Barrios, PA-C  12/22/2019, 9:18 AM    Patient seen and examined  I agree with findings of M Lenze above Pt with hx of diastolic CHF  She has diuresed 7 L since admit and had thoracentesis. Appears comfortable in bed On exam, Neck:  JVP is normal Lungs are relatively clear Cardiac exam:  Irreg irreg  No s3 Ext are without edema  Would keep on current regimen of oral torsemide   Check labs in AM  Dorris Carnes MD

## 2019-12-23 DIAGNOSIS — Z7189 Other specified counseling: Secondary | ICD-10-CM

## 2019-12-23 DIAGNOSIS — F0390 Unspecified dementia without behavioral disturbance: Secondary | ICD-10-CM

## 2019-12-23 LAB — BASIC METABOLIC PANEL
Anion gap: 12 (ref 5–15)
BUN: 37 mg/dL — ABNORMAL HIGH (ref 8–23)
CO2: 31 mmol/L (ref 22–32)
Calcium: 9.1 mg/dL (ref 8.9–10.3)
Chloride: 93 mmol/L — ABNORMAL LOW (ref 98–111)
Creatinine, Ser: 1.53 mg/dL — ABNORMAL HIGH (ref 0.44–1.00)
GFR calc Af Amer: 36 mL/min — ABNORMAL LOW (ref >60)
GFR calc non Af Amer: 31 mL/min — ABNORMAL LOW (ref >60)
Glucose, Bld: 235 mg/dL — ABNORMAL HIGH (ref 70–99)
Potassium: 3.8 mmol/L (ref 3.5–5.1)
Sodium: 136 mmol/L (ref 135–145)

## 2019-12-23 MED ORDER — TORSEMIDE 20 MG PO TABS: 20.0000 mg | ORAL_TABLET | Freq: Two times a day (BID) | ORAL | 3 refills | Status: AC

## 2019-12-23 MED ORDER — METOPROLOL TARTRATE 25 MG PO TABS: 25.0000 mg | ORAL_TABLET | Freq: Two times a day (BID) | ORAL | 3 refills | Status: AC

## 2019-12-23 MED ORDER — APIXABAN 2.5 MG PO TABS: 2.5000 mg | ORAL_TABLET | Freq: Two times a day (BID) | ORAL | 1 refills | Status: AC

## 2019-12-23 MED ORDER — ACETAMINOPHEN 325 MG PO TABS: 650.0000 mg | ORAL_TABLET | ORAL | 0 refills | Status: AC | PRN

## 2019-12-23 MED ORDER — POTASSIUM CHLORIDE ER 20 MEQ PO TBCR: 20.0000 meq | EXTENDED_RELEASE_TABLET | Freq: Every day | ORAL | 0 refills | Status: DC

## 2019-12-23 NOTE — Progress Notes (Addendum)
Progress Note  Patient Name: Kristy Mckinney Date of Encounter: 12/23/2019  Primary Cardiologist: Rozann Lesches, MD   Subjective   Breathing improved. Still on 2L [place]. No chest pain or palpitations.   Inpatient Medications    Scheduled Meds:  apixaban  2.5 mg Oral BID   diltiazem  180 mg Oral Daily   memantine  5 mg Oral BID   metoprolol tartrate  25 mg Oral BID   potassium chloride  40 mEq Oral Daily   QUEtiapine  25 mg Oral QHS   sertraline  50 mg Oral QHS   sodium chloride flush  3 mL Intravenous Q12H   torsemide  20 mg Oral BID   Continuous Infusions:  sodium chloride     PRN Meds: sodium chloride, acetaminophen, ondansetron (ZOFRAN) IV, sodium chloride flush   Vital Signs    Vitals:   12/22/19 1347 12/22/19 2226 12/23/19 0455 12/23/19 0600  BP: (!) 93/49 (!) 90/47 (!) 108/53   Pulse: 72 84 87   Resp: 17 20 20    Temp: 98.1 F (36.7 C) 99.1 F (37.3 C) 99 F (37.2 C)   TempSrc: Oral Oral Oral   SpO2: 98% 95% 95%   Weight:    58.4 kg  Height:        Intake/Output Summary (Last 24 hours) at 12/23/2019 0959 Last data filed at 12/23/2019 0700 Gross per 24 hour  Intake --  Output 400 ml  Net -400 ml    Last 3 Weights 12/23/2019 12/22/2019 12/20/2019  Weight (lbs) 128 lb 12 oz 128 lb 1.4 oz 127 lb 10.3 oz  Weight (kg) 58.4 kg 58.1 kg 57.9 kg      Telemetry    Not connected to telemetry.   ECG    No new tracings.   Physical Exam   General: Well developed, elderly female appearing in no acute distress. Head: Normocephalic, atraumatic.  Neck: Supple without bruits, JVD not elevated. Lungs:  Resp regular and unlabored, decreased breath sounds along bases bilaterally. Heart: Irregularly irregular, S1, S2, no S3, S4, 2/6 SEM along RUSB; no rub. Abdomen: Soft, non-tender, non-distended with normoactive bowel sounds. No hepatomegaly. No rebound/guarding. No obvious abdominal masses. Extremities: No clubbing, cyanosis, or edema. Distal pedal pulses are  2+ bilaterally. Neuro: Alert and oriented X 3. Moves all extremities spontaneously. Psych: Normal affect.  Labs    Chemistry Recent Labs  Lab 12/20/19 [phone] 12/21/19 0652 12/22/19 0544  NA 142 139 138  K 4.1 3.8 4.0  CL 96* 96* 96*  CO2 34* 32 34*  GLUCOSE 105* 104* 107*  BUN 35* 40* 41*  CREATININE 1.35* 1.41* 1.61*  CALCIUM 9.5 9.1 8.8*  GFRNONAA 36* 34* 29*  GFRAA 41* 39* 33*  ANIONGAP 12 11 8      Hematology Recent Labs  Lab 12/19/19 0633  WBC 8.6  RBC 4.30  HGB 9.8*  HCT 34.3*  MCV 79.8*  MCH 22.8*  MCHC 28.6*  RDW 20.5*  PLT 290    Cardiac EnzymesNo results for input(s): TROPONINI in the last 168 hours. No results for input(s): TROPIPOC in the last 168 hours.   BNPNo results for input(s): BNP, PROBNP in the last 168 hours.   DDimer No results for input(s): DDIMER in the last 168 hours.   Radiology    No results found.  Cardiac Studies   Echocardiogram: 12/2019 IMPRESSIONS     1. Left ventricular ejection fraction, by estimation, is 70 to 75%. The  left ventricle has hyperdynamic function.  The left ventricle has no  regional wall motion abnormalities. Left ventricular diastolic parameters  are consistent with Grade II diastolic  dysfunction (pseudonormalization). Elevated left atrial pressure.   2. Right ventricular systolic function is normal. The right ventricular  size is normal. There is severely elevated pulmonary artery systolic  pressure. The estimated right ventricular systolic pressure is 34.0 mmHg.   3. Left atrial size was moderately dilated.   4. Right atrial size was mildly dilated.   5. The mitral valve is degenerative. Trivial mitral valve regurgitation.  Moderate mitral stenosis. The mean mitral valve gradient is 8.0 mmHg.   6. Tricuspid valve regurgitation is severe.   7. The aortic valve is tricuspid. Aortic valve regurgitation is not  visualized. Mild to moderate aortic valve sclerosis/calcification is  present, without any  evidence of aortic stenosis. Aortic valve mean  gradient measures 7.0 mmHg. Aortic valve Vmax  measures 1.76 m/s.   8. The inferior vena cava is normal in size with <50% respiratory  variability, suggesting right atrial pressure of 8 mmHg.   Patient Profile     86 y.o. female with past medical history of persistent atrial fibrillation (diagnosed in 11/2018), chronic diastolic CHF, mild AS, mild MS and chronic back pain who is currently admitted for an acute CHF exacerbation.   Assessment & Plan    1. Acute on Chronic Diastolic CHF - BNP was elevated to 902 on admission and CXR consistent with CHF. She did undergo a right thoracentesis on 12/15/2019 with 1L of fluid removed.  - she was initially on IV Lasix 40mg  BID and was transitioned to Torsemide 20mg  BID on 12/20/2019. Recorded output of -7.1L thus far. Weight has declined from 135 lbs on admission to 128 lbs today. Creatinine at 1.61 yesterday (baseline 1.2 - 1.3). Will order BMET for this AM. If creatinine still trending upwards, would reduce Torsemide to 20mg  daily.   2. Pulmonary HTN/Valvular Heart Disease - most recent echocardiogram showed moderate MS and severe TR with PASP elevated to 73.9 mmHg. Not felt to be a candidate for invasive procedures given her age and dementia. Palliative Care was consulted this admission. She is now DNR.   3. Permanent Atrial Fibrillation - she was on Amiodarone prior to admission and this has been discontinued given her permanent arrhythmia. She has been continued on Lopressor 25mg  BID and Cardizem has been titrated back to her PTA dose of 180mg  daily.  - she remains on Eliquis 2.5mg  daily given age and weight < 60 kg.    Discussed with the admitting team. Planning to discharge Home with Hospice services later today.   For questions or updates, please contact [place] Please consult [email or web address] for contact info under Cardiology/STEMI.   Signed, Erma Heritage , PA-C 9:59  AM 12/23/2019 Pager: [phone]  Patient examined chart reviewed Right heart failure improved some Afib with reasonable rate control On eliquis low dose. Amiodarone d/c  Home hospice appropriate  Cardiology will sign off   Jenkins Rouge MD [facility]

## 2019-12-23 NOTE — Progress Notes (Signed)
Discharge instructions reviewed with patient. Given AVS, prescriptions sent to her pharmacy per MD. Verbalized understanding of instructions, to pick up prescriptions and follow-up appointments. IV site removed, site within normal limits. Son verbalized understanding of AVS at pickup for discharge home. Patient left floor in stable condition via w/c accompanied by nursing staff.

## 2019-12-23 NOTE — Progress Notes (Signed)
Palliative: Kristy Mckinney is to discharge home today with the benefits of in-home hospice care. Active with [facility] services.  Plan: Home with in-home hospice care, [facility] services already in place.  No charge Quinn Axe, NP Palliative Medicine Team Team Phone # [phone] Greater than 50% of this time was spent counseling and coordinating care related to the above assessment and plan.

## 2019-12-23 NOTE — Discharge Summary (Signed)
Kristy Mckinney, is a 86 y.o. female  DOB Jan 31, 1932  MRN [id number].  Admission date:  12/13/2019  Admitting Physician  Orson Eva, MD  Discharge Date:  12/23/2019   Primary MD  Celene Squibb, MD  Recommendations for primary care physician for things to follow:   1)Discharge home with home hospice services 2)Very low-salt diet advised 3)Weigh yourself daily, call if you gain more than 3 pounds in 1 day or more than 5 pounds in 1 week as your diuretic medications may need to be adjusted 4)Limit your Fluid  intake to no more than 60 ounces (1.8 Liters) per day 5) you are taking Eliquis/apixaban which is a blood thinner so Avoid ibuprofen/Advil/Aleve/Motrin/Goody Powders/Naproxen/BC powders/Meloxicam/Diclofenac/Indomethacin and other Nonsteroidal anti-inflammatory medications as these will make you more likely to bleed and can cause stomach ulcers, can also cause Kidney problems.    Admission Diagnosis  Acute exacerbation of CHF (congestive heart failure) (HCC) [I50.9] Acute respiratory failure with hypoxia (HCC) [V37.48] Systolic congestive heart failure, unspecified HF chronicity (HCC) [I50.20]  Discharge Diagnosis  Acute exacerbation of CHF (congestive heart failure) (HCC) [I50.9] Acute respiratory failure with hypoxia (HCC) [O70.78] Systolic congestive heart failure, unspecified HF chronicity (HCC) [I50.20]    Active Problems:   Atrial fibrillation with RVR (HCC)   Hypomagnesemia   Atrial fibrillation (HCC)   Acute respiratory failure with hypoxia (HCC)   Acute on chronic diastolic CHF (congestive heart failure) (HCC)   Unspecified dementia without behavioral disturbance (HCC)   Syncope and collapse   DNR (do not resuscitate)   Status post thoracentesis   Encounter for hospice care discussion      Past Medical History:  Diagnosis Date  . Anxiety   . Atrial fibrillation ([place])   . Chronic back  pain   . Chronic diastolic heart failure ([place])   . Dementia ([place])   . Disorder of left temporomandibular joint   . Essential hypertension   . GERD (gastroesophageal reflux disease)   . History of bronchitis   . Iron deficiency anemia   . Irritable bowel syndrome with diarrhea   . Mitral stenosis   . Osteoporosis   . Pneumonia   . Pulmonary hypertension ([place])     Past Surgical History:  Procedure Laterality Date  . ABDOMINAL HYSTERECTOMY    . BACK SURGERY    . CHOLECYSTECTOMY N/A 01/29/2014   Procedure: LAPAROSCOPIC CHOLECYSTECTOMY;  Surgeon: Scherry Ran, MD;  Location: [facility];  Service: General;  Laterality: N/A;  . COLONOSCOPY N/A 10/27/2015   Procedure: COLONOSCOPY;  Surgeon: Rogene Houston, MD;  [facility];  Service: Endoscopy;  Laterality: N/A;  215  . INTRAMEDULLARY (IM) NAIL INTERTROCHANTERIC Right 11/29/2018   Procedure: INTRAMEDULLARY (IM) NAIL INTERTROCHANTRIC FRACTURE;  Surgeon: Renette Butters, MD;  Location: [facility];  Service: Orthopedics;  Laterality: Right;  . TONSILLECTOMY    . YAG LASER APPLICATION Left 67/54/4920   Procedure: YAG LASER APPLICATION;  Surgeon: Elta Guadeloupe T. Gershon Crane, MD;  Location: [facility];  Service: Ophthalmology;  Laterality:  Left;  left     HPI  from the history and physical done on the day of admission:    Chief Complaint: Fall, generalized weakness and dyspnea  HPI: Kristy Mckinney is a 86 y.o. female with medical history significant of hypertension, hyperlipidemia, atrial fibrillation on Eliquis, dementia, iron deficiency anemia and diastolic heart failure presented to ED for evaluation of fall, generalized weakness and shortness of breath with hypoxia.  As per ED physician, patient's daughter reported that patient is recently feeling very weak and had a fall today and was unable to get up and when the daughter reached her home her oxygen saturation was in 74s.  During my evaluation patient's daughter is not present in the room.   Patient states that she is feeling very weak and tired and also complaining of dyspnea with exertion and orthopnea with swelling in bilateral lower extremities.  Patient states that she takes her diuretic regularly as prescribed but still she felt that her legs are getting more swollen for the last 3 to 4 days.  Patient otherwise denies fever, chills, chest pain, nausea, vomiting, abdominal pain and urinary symptoms.  (For level 3, the HPI must include 4+ descriptors: Location, Quality, Severity, Duration, Timing, Context, modifying factors, associated signs/symptoms and/or status of 3+ chronic problems.)  (Please avoid self-populating past medical history here) (The initial 2-3 lines should be focused and good to copy and paste in the HPI section of the daily progress note).  ED Course: On arrival to the ED patient had blood pressure of 117/63, heart rate 77, temperature 97.7 and 3 L of oxygen with nasal cannula that improved her oxygen saturation to 94%.  Blood work showed WBC 8.4, hemoglobin 9.4, sodium 141, potassium 3.4, BUN 47, creatinine 2.2, blood glucose 129.  BNP 902.  Chest x-ray showed enlarged right-sided pleural effusion.  CT head negative for acute intracranial bleed or pathology cervical spine also negative for acute fracture or dislocation.  1 dose of IV Lasix 40 mg in the ED and her dyspnea improved and she also mentioned that bilateral lower extremity edema also improved.  Review of Systems: As per HPI otherwise 10 point review of systems negative.    Hospital Course:   Brief History: 86 year old female with a history of permanent atrial fibrillation, diastolic CHF, GERD, anxiety, dementia, hypertension presenting with a syncopal episode. The patient states that she was going across her kitchen to put a big potato in the microwave and started feeling dizzy. The next thing she remembered was waking up on the floor with the potato scattered over the floor. The patient has denied any  recent chest pain, fevers, chills, palpitations, nausea, vomiting, diarrhea, abdominal pain, dysuria. She recently followed up with her cardiology office on 12/04/2019. Her torsemide was increased to 20 mg twice daily from 20 mg daily. She endorses compliance. However, the patient is not aware of any type of fluid restriction. She has noted increasing lower extremity edema and just feeling "tired" with ambulation. In the emergency department, the patient was noted to have oxygen saturation of 78% on room air. She was afebrile hemodynamically stable. She was placed on supplemental oxygen with oxygen saturation up to 97%. BMP showed a potassium 3.4 with unremarkable LFTs. WBC 8.4, hemoglobin 9.4, platelets 304,000. BNP was 902. Troponins were unremarkable. CT of the brain and C-spine were unremarkable. Chest x-ray showed bilateral pleural effusions, right greater than left. There is increased interstitial markings. The patient was started IV furosemide.  Assessment/Plan: Acute respiratory  failure with hypoxia -Secondary to pulmonary edema pleural effusion -Currently on 2 L nasal cannula -Wean oxygen for saturation greater 92% -Personally reviewed chest x-ray--bilateral pleural effusions, right greater than left. Increased interstitial markings -Dyspnea persist- Discharge home with home hospice services  Acute on chronic diastolic CHF, right-sided heart failure with severe pulmonary hypertension -treated with IV furosemide 40 mg IV twice daily -- transition to oral torsemide -pt endorses some indiscretion with fluid intake at home ---cardiology input appreciated -Patient states that last weight at home was 150.4 pounds on 12/13/2019, current weight is 127 lbs -6/7 R-side thoracocentesis--1L removed Dyspnea persist -Discharge home with home hospice services  Acute kidney injury on chronic kidney disease stage IIIa -Likely precipitated by CHF low output -Creatinine improving with  diuresis -Continue to monitor renal function. --Dyspnea persist- Discharge home with home hospice services  Syncope -Echo--EF 70-75%, grade 2DD, severe TR, severe elevated PASP -remain on tele -orthostatics -EEG--mild diffuse encephalopathy  Permanent atrial fibrillation -Rate controlled -Continue diltiazem CD -Amiodarone discontinued per cardiology -Metoprolol dosing adjusted for better rate control -Continue apixaban --Dyspnea persist- Discharge home with home hospice services  Cognitive impairment/dementia without behavioral disturbance -Continue Namenda and Seroquel -Continue sertraline  Hypokalemia -Replace,  -magnesium--2.3  Goals of care -palliative medicine consult -frequent hospitalizations -changed to DNR -Palliative care follow-up as an outpatient -Discussed further goals of care with patient's daughter who will assist with further conversations with the patient -Emphasized importance of quality of life which the daughter agrees with. --Dyspnea persist- Discharge home with home hospice services   Transition to torsemide  Dispo: The patient is from:Home Anticipated d/c is ZM:OQHU with hospice in a.m.  Family Communication:Discussed with daughter,  Consultants:Cardiology, palliative care, Dyspnea persist -Discharge home with home hospice services  Code Status: DNR  DVT Prophylaxis:apixaban  Discharge Condition: --Dyspnea persist -Discharge home with home hospice services  Follow UP   Follow-up Information    Celene Squibb, MD. Go on 12/22/2019.   Specialty: Internal Medicine Why: Appointment at 3:00.  Contact information: [facility] [facility] [address] [phone]        [facility] Follow up.   Specialty: [place] Why: [place], [place] M, PA-C Follow up on 01/09/2020.   Specialties: Physician Assistant, Cardiology Why: Cardiology  Hospital Follow-up on 01/09/2020 at 2:30 PM.  Contact information: [place] [address] [phone]        [facility] Follow up.   Specialty: Hospice Why: For pallative care. Contact information: [address] [place] [phone]             Diet and Activity recommendation:  As advised  Discharge Instructions    Discharge Instructions    Call MD for:  difficulty breathing, headache or visual disturbances   Complete by: As directed    Call MD for:  persistant dizziness or light-headedness   Complete by: As directed    Call MD for:  persistant nausea and vomiting   Complete by: As directed    Call MD for:  severe uncontrolled pain   Complete by: As directed    Call MD for:  temperature >100.4   Complete by: As directed    Diet - low sodium heart healthy   Complete by: As directed    Discharge instructions   Complete by: As directed    1)Discharge home with home hospice services 2)Very low-salt diet advised  3)Weigh yourself daily, call if you gain more than 3 pounds in 1 day or more than 5 pounds in 1 week as your diuretic medications may need to be adjusted 4)Limit your Fluid  intake to no more than 60 ounces (1.8 Liters) per day 5) you are taking Eliquis/apixaban which is a blood thinner so Avoid ibuprofen/Advil/Aleve/Motrin/Goody Powders/Naproxen/BC powders/Meloxicam/Diclofenac/Indomethacin and other Nonsteroidal anti-inflammatory medications as these will make you more likely to bleed and can cause stomach ulcers, can also cause Kidney problems.   Increase activity slowly   Complete by: As directed       Discharge Medications     Allergies as of 12/23/2019      Reactions   Penicillins Anaphylaxis   Did it involve swelling of the face/tongue/throat, SOB, or low BP? Unknown Did it involve sudden or severe rash/hives, skin peeling, or any reaction on the inside of your mouth or nose? Unknown Did you  need to seek medical attention at a hospital or doctor's office? Unknown When did it last happen? If all above answers are "NO", may proceed with cephalosporin use.   Biaxin [clarithromycin]    Ciprofloxacin Nausea And Vomiting   Emetrol Nausea Only   swelling   Feldene [piroxicam] Nausea And Vomiting   Sulfa Antibiotics    swelling      Medication List    STOP taking these medications   alendronate 70 MG tablet Commonly known as: FOSAMAX   amiodarone 200 MG tablet Commonly known as: PACERONE     TAKE these medications   acetaminophen 325 MG tablet Commonly known as: TYLENOL Take 2 tablets (650 mg total) by mouth every 4 (four) hours as needed for headache or mild pain. What changed:   when to take this  reasons to take this   apixaban 2.5 MG Tabs tablet Commonly known as: ELIQUIS Take 1 tablet (2.5 mg total) by mouth 2 (two) times daily. What changed:   medication strength  how much to take   calcium-vitamin D 500-200 MG-UNIT tablet Commonly known as: Os-Cal 500 + D Take 1 tablet by mouth 2 (two) times daily.   diltiazem 180 MG 24 hr capsule Commonly known as: Cardizem CD Take 1 capsule (180 mg total) by mouth daily.   ferrous sulfate 325 (65 FE) MG EC tablet Take 1 tablet (325 mg total) by mouth daily with breakfast.   magnesium oxide 400 MG tablet Commonly known as: MAG-OX Take 1 tablet (400 mg total) by mouth 3 (three) times daily.   memantine 5 MG tablet Commonly known as: NAMENDA Take 1 tablet (5 mg total) by mouth 2 (two) times daily.   metoprolol tartrate 25 MG tablet Commonly known as: LOPRESSOR Take 1 tablet (25 mg total) by mouth 2 (two) times daily. What changed: how much to take   pantoprazole 40 MG tablet Commonly known as: PROTONIX TAKE (1) TABLET BY MOUTH DAILY.   Potassium Chloride ER 20 MEQ Tbcr Take 20 mEq by mouth daily. What changed:   medication strength  how much to take  additional instructions   QUEtiapine  25 MG tablet Commonly known as: SEROQUEL Take 1 tablet (25 mg total) by mouth at bedtime.   sertraline 50 MG tablet Commonly known as: ZOLOFT Take 1 tablet (50 mg total) by mouth every morning.   SYSTANE OP Place 1 drop into both eyes daily as needed. Dry Eyes   torsemide 20 MG tablet Commonly known as: DEMADEX Take 1 tablet (20 mg total) by mouth 2 (two) times  daily. What changed: additional instructions      Major procedures and Radiology Reports - PLEASE review detailed and final reports for all details, in brief -   DG Chest 1 View  Result Date: 12/15/2019 CLINICAL DATA:  Post RIGHT thoracentesis EXAM: CHEST  1 VIEW COMPARISON:  12/13/2019 FINDINGS: Enlargement of cardiac silhouette with mitral annular calcification. Atherosclerotic calcification aorta. Pulmonary vascular congestion. Small bibasilar pleural effusions and atelectasis decreased on RIGHT since prior exam post thoracentesis. Mild perihilar edema. No pneumothorax. Bones demineralized. IMPRESSION: No pneumothorax following RIGHT thoracentesis. Enlargement of cardiac silhouette with pulmonary vascular congestion and mild pulmonary edema. Bibasilar effusions and atelectasis decreased on RIGHT since prior study Electronically Signed   By: Lavonia Dana M.D.   On: 12/15/2019 13:32   CT Head Wo Contrast  Result Date: 12/13/2019 CLINICAL DATA:  [place], back pain EXAM: CT HEAD WITHOUT CONTRAST CT CERVICAL SPINE WITHOUT CONTRAST TECHNIQUE: Multidetector CT imaging of the head and cervical spine was performed following the standard protocol without intravenous contrast. Multiplanar CT image reconstructions of the cervical spine were also generated. COMPARISON:  11/28/2018 FINDINGS: CT HEAD FINDINGS Brain: Chronic small vessel ischemic changes are seen within the periventricular white matter. No acute infarct or hemorrhage. Lateral ventricles and midline structures are unremarkable. No acute extra-axial fluid collections. No mass effect.  Vascular: No hyperdense vessel or unexpected calcification. Skull: There is a small ex of little scalp hematoma. No underlying fracture. Remainder of the calvarium is unremarkable. Sinuses/Orbits: No acute finding. Other: None. CT CERVICAL SPINE FINDINGS Alignment: Alignment is grossly anatomic. Skull base and vertebrae: No acute displaced fractures. Soft tissues and spinal canal: No prevertebral fluid or swelling. No visible canal hematoma. Disc levels: Disc space narrowing and osteophyte formation is seen from C3-4 through C6-7, most pronounced at the C5-6 level. At C3-4 there is minimal symmetrical neural foraminal encroachment. At C4-5 there is mild central stenosis with bilateral right greater than left neural foraminal encroachment. At C5-6 there is at least moderate to severe central canal stenosis with significant right greater than left neural foraminal encroachment. At C6-7 there is mild central stenosis with severe right greater than left neural foraminal encroachment. Upper chest: Airway is patent. Large right pleural effusion partially visualized. Left apex is clear. Other: Reconstructed images demonstrate no additional findings. IMPRESSION: 1. No acute intracranial process. 2. No acute cervical spine fracture. 3. Significant multilevel cervical spondylosis, most pronounced at C5-6. Significant but less severe changes are seen at C4-5 and C6-7 as above. 4. Large right pleural effusion. Electronically Signed   By: Randa Ngo M.D.   On: 12/13/2019 23:22   CT Cervical Spine Wo Contrast  Result Date: 12/13/2019 CLINICAL DATA:  [place], back pain EXAM: CT HEAD WITHOUT CONTRAST CT CERVICAL SPINE WITHOUT CONTRAST TECHNIQUE: Multidetector CT imaging of the head and cervical spine was performed following the standard protocol without intravenous contrast. Multiplanar CT image reconstructions of the cervical spine were also generated. COMPARISON:  11/28/2018 FINDINGS: CT HEAD FINDINGS Brain: Chronic small  vessel ischemic changes are seen within the periventricular white matter. No acute infarct or hemorrhage. Lateral ventricles and midline structures are unremarkable. No acute extra-axial fluid collections. No mass effect. Vascular: No hyperdense vessel or unexpected calcification. Skull: There is a small ex of little scalp hematoma. No underlying fracture. Remainder of the calvarium is unremarkable. Sinuses/Orbits: No acute finding. Other: None. CT CERVICAL SPINE FINDINGS Alignment: Alignment is grossly anatomic. Skull base and vertebrae: No acute displaced fractures. Soft tissues and spinal canal: No  prevertebral fluid or swelling. No visible canal hematoma. Disc levels: Disc space narrowing and osteophyte formation is seen from C3-4 through C6-7, most pronounced at the C5-6 level. At C3-4 there is minimal symmetrical neural foraminal encroachment. At C4-5 there is mild central stenosis with bilateral right greater than left neural foraminal encroachment. At C5-6 there is at least moderate to severe central canal stenosis with significant right greater than left neural foraminal encroachment. At C6-7 there is mild central stenosis with severe right greater than left neural foraminal encroachment. Upper chest: Airway is patent. Large right pleural effusion partially visualized. Left apex is clear. Other: Reconstructed images demonstrate no additional findings. IMPRESSION: 1. No acute intracranial process. 2. No acute cervical spine fracture. 3. Significant multilevel cervical spondylosis, most pronounced at C5-6. Significant but less severe changes are seen at C4-5 and C6-7 as above. 4. Large right pleural effusion. Electronically Signed   By: Randa Ngo M.D.   On: 12/13/2019 23:22   DG Pelvis Portable  Result Date: 12/13/2019 CLINICAL DATA:  [place], back pain EXAM: PORTABLE PELVIS 1-2 VIEWS COMPARISON:  None. FINDINGS: Single frontal view of the pelvis demonstrates postsurgical changes right hip. No acute  displaced fracture. Left convex scoliosis lumbar spine. Sacroiliac joints are normal. Soft tissues are unremarkable. IMPRESSION: 1. No acute displaced fracture. Electronically Signed   By: Randa Ngo M.D.   On: 12/13/2019 22:46   DG Chest Portable 1 View  Result Date: 12/13/2019 CLINICAL DATA:  [place], pain EXAM: PORTABLE CHEST 1 VIEW COMPARISON:  10/13/2019 FINDINGS: Single frontal view of the chest demonstrates an enlarged right pleural effusion with right basilar atelectasis. Left basilar consolidation unchanged. No pneumothorax. Cardiac silhouette is stable. Atherosclerosis aortic arch. No acute displaced fractures. IMPRESSION: 1. Enlarging right pleural effusion. 2. Persistent bibasilar areas of consolidation likely atelectasis, increased on the right since prior study. Electronically Signed   By: Randa Ngo M.D.   On: 12/13/2019 22:43   EEG adult  Result Date: 12/15/2019 Lora Havens, MD     12/15/2019  2:20 PM Patient Name: Kristy Mckinney MRN: [id number] Epilepsy Attending: Lora Havens Referring Physician/Provider: Dr. Shanon Brow Tat Date: 12/15/2019 Duration: 23.39 minutes Patient history: 80 old female with syncope.  EEG evaluate for seizures. Level of alertness: Awake AEDs during EEG study: None Technical aspects: This EEG study was done with scalp electrodes positioned according to the 10-20 International system of electrode placement. Electrical activity was acquired at a sampling rate of 500Hz  and reviewed with a high frequency filter of 70Hz  and a low frequency filter of 1Hz . EEG data were recorded continuously and digitally stored. Description: The posterior dominant rhythm consists of 8 Hz activity of moderate voltage (25-35 uV) seen predominantly in posterior head regions, symmetric and reactive to eye opening and eye closing. EEG showed intermittent generalized 3 to 6 Hz theta-delta slowing. Hyperventilation and photic stimulation were not performed.   ABNORMALITY -Intermittent slow,  generalized IMPRESSION: This study is suggestive of mild diffuse encephalopathy, nonspecific etiology. No seizures or epileptiform discharges were seen throughout the recording. Lora Havens   ECHOCARDIOGRAM COMPLETE  Result Date: 12/14/2019    ECHOCARDIOGRAM REPORT   Patient Name:   Kristy Mckinney Date of Exam: 12/14/2019 Medical Rec #:  [id number]         Height:       64.5 in Accession #:    [id number]        Weight:       142.0 lb Date of Birth:  07-Oct-1931         BSA:          1.700 m Patient Age:    16 years          BP:           111/66 mmHg Patient Gender: F                 HR:           101 bpm. Exam Location:  [facility] Procedure: 2D Echo, Cardiac Doppler and Color Doppler Indications:    CHF  History:        Patient has prior history of Echocardiogram examinations, most                 recent 04/04/2019. CHF, Arrythmias:Atrial Fibrillation,                 Signs/Symptoms:Dyspnea; Risk Factors:Hypertension, Dyslipidemia                 and Dementia.  Sonographer:    Dustin Flock RDCS Referring Phys: [phone] DAVID TAT IMPRESSIONS  1. Left ventricular ejection fraction, by estimation, is 70 to 75%. The left ventricle has hyperdynamic function. The left ventricle has no regional wall motion abnormalities. Left ventricular diastolic parameters are consistent with Grade II diastolic dysfunction (pseudonormalization). Elevated left atrial pressure.  2. Right ventricular systolic function is normal. The right ventricular size is normal. There is severely elevated pulmonary artery systolic pressure. The estimated right ventricular systolic pressure is 52.8 mmHg.  3. Left atrial size was moderately dilated.  4. Right atrial size was mildly dilated.  5. The mitral valve is degenerative. Trivial mitral valve regurgitation. Moderate mitral stenosis. The mean mitral valve gradient is 8.0 mmHg.  6. Tricuspid valve regurgitation is severe.  7. The aortic valve is tricuspid. Aortic valve regurgitation is not  visualized. Mild to moderate aortic valve sclerosis/calcification is present, without any evidence of aortic stenosis. Aortic valve mean gradient measures 7.0 mmHg. Aortic valve Vmax measures 1.76 m/s.  8. The inferior vena cava is normal in size with <50% respiratory variability, suggesting right atrial pressure of 8 mmHg. Comparison(s): No significant change from prior study. Prior images reviewed side by side. FINDINGS  Left Ventricle: Left ventricular ejection fraction, by estimation, is 70 to 75%. The left ventricle has hyperdynamic function. The left ventricle has no regional wall motion abnormalities. The left ventricular internal cavity size was normal in size. There is no left ventricular hypertrophy. Left ventricular diastolic parameters are consistent with Grade II diastolic dysfunction (pseudonormalization). Elevated left atrial pressure. Right Ventricle: The right ventricular size is normal. No increase in right ventricular wall thickness. Right ventricular systolic function is normal. There is severely elevated pulmonary artery systolic pressure. The tricuspid regurgitant velocity is 4.21 m/s, and with an assumed right atrial pressure of 3 mmHg, the estimated right ventricular systolic pressure is 41.3 mmHg. Left Atrium: Left atrial size was moderately dilated. Right Atrium: Right atrial size was mildly dilated. Pericardium: There is no evidence of pericardial effusion. Mitral Valve: The mitral valve is degenerative in appearance. There is severe thickening of the mitral valve leaflet(s). There is severe calcification of the mitral valve leaflet(s). Normal mobility of the mitral valve leaflets. Severe mitral annular calcification. Trivial mitral valve regurgitation. Moderate mitral valve stenosis. MV peak gradient, 17.6 mmHg. The mean mitral valve gradient is 8.0 mmHg. Tricuspid Valve: The tricuspid valve is normal in structure. Tricuspid valve regurgitation is severe. No evidence of  tricuspid  stenosis. Aortic Valve: The aortic valve is tricuspid. Aortic valve regurgitation is not visualized. Mild to moderate aortic valve sclerosis/calcification is present, without any evidence of aortic stenosis. Aortic valve mean gradient measures 7.0 mmHg. Aortic valve peak gradient measures 12.4 mmHg. Aortic valve area, by VTI measures 2.21 cm. Pulmonic Valve: The pulmonic valve was normal in structure. Pulmonic valve regurgitation is mild. No evidence of pulmonic stenosis. Aorta: The aortic root is normal in size and structure. Venous: The inferior vena cava is normal in size with less than 50% respiratory variability, suggesting right atrial pressure of 8 mmHg. IAS/Shunts: No atrial level shunt detected by color flow Doppler.  LEFT VENTRICLE PLAX 2D LVIDd:         3.29 cm  Diastology LVIDs:         2.34 cm  LV e' lateral:   7.10 cm/s LV PW:         1.06 cm  LV E/e' lateral: 25.5 LV IVS:        0.96 cm  LV e' medial:    3.42 cm/s LVOT diam:     2.00 cm  LV E/e' medial:  52.9 LV SV:         74 LV SV Index:   44 LVOT Area:     3.14 cm  RIGHT VENTRICLE RV Basal diam:  3.47 cm RV S prime:     5.98 cm/s TAPSE (M-mode): 2.1 cm LEFT ATRIUM             Index       RIGHT ATRIUM           Index LA diam:        5.40 cm 3.18 cm/m  RA Area:     19.10 cm LA Vol (A2C):   77.1 ml 45.34 ml/m RA Volume:   53.00 ml  31.17 ml/m LA Vol (A4C):   79.3 ml 46.64 ml/m LA Biplane Vol: 82.6 ml 48.58 ml/m  AORTIC VALVE AV Area (Vmax):    1.77 cm AV Area (Vmean):   1.79 cm AV Area (VTI):     2.21 cm AV Vmax:           176.00 cm/s AV Vmean:          121.000 cm/s AV VTI:            0.336 m AV Peak Grad:      12.4 mmHg AV Mean Grad:      7.0 mmHg LVOT Vmax:         98.90 cm/s LVOT Vmean:        68.900 cm/s LVOT VTI:          0.236 m LVOT/AV VTI ratio: 0.70  AORTA Ao Root diam: 2.90 cm MITRAL VALVE                TRICUSPID VALVE MV Area (PHT): 3.42 cm     TR Peak grad:   70.9 mmHg MV Peak grad:  17.6 mmHg    TR Vmax:        421.00 cm/s MV  Mean grad:  8.0 mmHg MV Vmax:       2.10 m/s     SHUNTS MV Vmean:      128.5 cm/s   Systemic VTI:  0.24 m MV Decel Time: 222 msec     Systemic Diam: 2.00 cm MV E velocity: 181.00 cm/s MV A velocity: 66.30 cm/s MV E/A ratio:  2.73 Candee Furbish MD Electronically signed  by Candee Furbish MD Signature Date/Time: 12/14/2019/12:34:52 PM    Final    US THORACENTESIS ASP PLEURAL SPACE W/IMG GUIDE  Result Date: 12/15/2019 INDICATION: Recurrent RIGHT pleural effusion EXAM: ULTRASOUND GUIDED DIAGNOSTIC AND THERAPEUTIC RIGHT THORACENTESIS MEDICATIONS: None. COMPLICATIONS: None immediate. PROCEDURE: An ultrasound guided thoracentesis was thoroughly discussed with the patient and questions answered. The benefits, risks, alternatives and complications were also discussed. The patient understands and wishes to proceed with the procedure. Written consent was obtained. Ultrasound was performed to localize and mark an adequate pocket of fluid in the RIGHT chest. The area was then prepped and draped in the normal sterile fashion. 1% Lidocaine was used for local anesthesia. Under ultrasound guidance a 8 French thoracentesis catheter was introduced. Thoracentesis was performed. The catheter was removed and a dressing applied. FINDINGS: A total of approximately 1 L of amber colored RIGHT pleural fluid was removed. Samples were sent to the laboratory as requested by the clinical team. IMPRESSION: Successful ultrasound guided RIGHT thoracentesis yielding 1 L of pleural fluid. Electronically Signed   By: Lavonia Dana M.D.   On: 12/15/2019 13:44   Micro Results   Recent Results (from the past 240 hour(s))  SARS Coronavirus 2 by RT PCR (hospital order, performed in [facility] hospital lab) Nasopharyngeal Nasopharyngeal Swab     Status: None   Collection Time: 12/13/19 10:01 PM   Specimen: Nasopharyngeal Swab  Result Value Ref Range Status   SARS Coronavirus 2 NEGATIVE NEGATIVE Final    Comment: (NOTE) SARS-CoV-2 target nucleic acids  are NOT DETECTED. The SARS-CoV-2 RNA is generally detectable in upper and lower respiratory specimens during the acute phase of infection. The lowest concentration of SARS-CoV-2 viral copies this assay can detect is 250 copies / mL. A negative result does not preclude SARS-CoV-2 infection and should not be used as the sole basis for treatment or other patient management decisions.  A negative result may occur with improper specimen collection / handling, submission of specimen other than nasopharyngeal swab, presence of viral mutation(s) within the areas targeted by this assay, and inadequate number of viral copies (<250 copies / mL). A negative result must be combined with clinical observations, patient history, and epidemiological information. Fact Sheet for Patients:   StrictlyIdeas.no Fact Sheet for Healthcare Providers: BankingDealers.co.za This test is not yet approved or cleared  by the [place] FDA and has been authorized for detection and/or diagnosis of SARS-CoV-2 by FDA under an Emergency Use Authorization (EUA).  This EUA will remain in effect (meaning this test can be used) for the duration of the COVID-19 declaration under Section 564(b)(1) of the Act, 21 U.S.C. section 360bbb-3(b)(1), unless the authorization is terminated or revoked sooner. Performed at [facility], [address], [place], [place] [address]   Culture, body fluid-bottle     Status: None   Collection Time: 12/15/19  1:05 PM   Specimen: Fluid  Result Value Ref Range Status   Specimen Description FLUID PLEURAL COLLECTED BY DOCTOR  Final   Special Requests BOTTLES DRAWN AEROBIC AND ANAEROBIC [facility]  Final   Culture   Final    NO GROWTH 5 DAYS Performed at [facility], [address] [place], [place] [address]    Report Status 12/20/2019 FINAL  Final  Gram stain     Status: None   Collection Time: 12/15/19  1:05 PM   Specimen: Pleura  Result  Value Ref Range Status   Specimen Description PLEURAL  Final   Special Requests PLEURAL  Final  Gram Stain   Final    CYTOSPIN SMEAR WBC PRESENT, PREDOMINANTLY MONONUCLEAR NO ORGANISMS SEEN Performed at [facility] Performed at [facility], [address] [place], [place] [address]    Report Status 12/15/2019 FINAL  Final   Today  Subjective    Kristy Mckinney today has Dyspnea ---Discharge home with home hospice services          Patient has been seen and examined prior to discharge   Objective   Blood pressure (!) 111/48, pulse (!) 105, temperature 99 F (37.2 C), temperature source Oral, resp. rate 19, height 5' 4.5" (1.638 m), weight 58.4 kg, SpO2 90 %.   Intake/Output Summary (Last 24 hours) at 12/23/2019 1607 Last data filed at 12/23/2019 0900 Gross per 24 hour  Intake 240 ml  Output 400 ml  Net -160 ml   Exam Gen:- Awake Alert, frail-appearing , no acute distress  HEENT:- [place].AT, No sclera icterus Neck-Supple Neck,No JVD,.  Lungs-improved air movement, no wheezing CV- S1, S2 normal, regular Abd-  +ve B.Sounds, Abd Soft, No tenderness,    Extremity/Skin:- No  edema,   good pulses Psych-affect is appropriate, oriented x3 Neuro-Generalized weakness, no new focal deficits, no tremors    Data Review   CBC w Diff:  Lab Results  Component Value Date   WBC 8.6 12/19/2019   HGB 9.8 (L) 12/19/2019   HCT 34.3 (L) 12/19/2019   PLT 290 12/19/2019   LYMPHOPCT 13 12/13/2019   MONOPCT 9 12/13/2019   EOSPCT 1 12/13/2019   BASOPCT 1 12/13/2019    CMP:  Lab Results  Component Value Date   NA 136 12/23/2019   K 3.8 12/23/2019   CL 93 (L) 12/23/2019   CO2 31 12/23/2019   BUN 37 (H) 12/23/2019   BUN 21 03/08/2018   CREATININE 1.53 (H) 12/23/2019   CREATININE 1.34 (H) 10/28/2019   GLU 101 03/08/2018   PROT 7.1 12/13/2019   ALBUMIN 3.9 12/13/2019   BILITOT 1.1 12/13/2019   ALKPHOS 127 (H) 12/13/2019   AST 37 12/13/2019   ALT 32 12/13/2019   .   Total Discharge time is about 33 minutes  Roxan Hockey M.D on 12/23/2019 at 4:07 PM  Go to [email or web address] -  for contact info  [facility] - Office  [phone]

## 2019-12-23 NOTE — Progress Notes (Signed)
PT Cancellation Note  Patient Details Name: Kristy Mckinney MRN: [id number] DOB: May 23, 1932   Cancelled Treatment:    Reason Eval/Treat Not Completed: Patient declined, no reason specified. Per conversation with RN, pt d/c home with hospice today. Therapist offered therapy session to pt, but pt politely declined. Will sign off at this time, thank you.    Talbot Grumbling PT, DPT 12/23/19, 10:43 AM [phone]

## 2019-12-23 NOTE — Discharge Instructions (Signed)
1)Discharge home with home hospice services 2)Very low-salt diet advised 3)Weigh yourself daily, call if you gain more than 3 pounds in 1 day or more than 5 pounds in 1 week as your diuretic medications may need to be adjusted 4)Limit your Fluid  intake to no more than 60 ounces (1.8 Liters) per day 5) you are taking Eliquis/apixaban which is a blood thinner so Avoid ibuprofen/Advil/Aleve/Motrin/Goody Powders/Naproxen/BC powders/Meloxicam/Diclofenac/Indomethacin and other Nonsteroidal anti-inflammatory medications as these will make you more likely to bleed and can cause stomach ulcers, can also cause Kidney problems.

## 2019-12-23 NOTE — TOC Transition Note (Signed)
Transition of Care [facility]) - CM/SW Discharge Note   Patient Details  Name: Kristy Mckinney MRN: [id number] Date of Birth: 1931/10/22  [facility] CM/SW Contact:  Natasha Bence, LCSW Phone Number: 12/23/2019, 1:11 PM   Clinical Narrative:    Patient medically ready for discharge. TOC has set up [facility] PT with [facility], Vaughan Basta was updated. Also referred to [facility] palliative services. TOC updated Leda Min, the daughter of patient, of discharge.    Final next level of care: [place] Barriers to Discharge: Continued Medical Work up   Patient Goals and CMS Choice Patient states their goals for this hospitalization and ongoing recovery are:: Return home with Home Health   Choice offered to / list presented to : Patient       Discharge Plan and Services In-house Referral: Clinical Social Work Discharge Planning Services: Follow-up appt scheduled Post Acute Care Choice: Durable Medical Equipment, Home Health          DME Arranged: 3-N-1 DME Agency: [employer] Date DME Agency Contacted: 12/17/19 Time DME Agency Contacted: [id number] Representative spoke with at DME Agency: [place]: PT [place]: [place] ([place]) Date [place]: 12/18/19 Time [place]: 0230 Representative spoke with at [place]: [place]    Readmission Risk Interventions Readmission Risk Prevention Plan 12/15/2019 09/12/2019 05/10/2019  Post Dischage Appt - - -  Appt Comments - - -  Medication Screening - - -  Transportation Screening Complete Complete Complete  Medication Review Press photographer) Complete Complete Complete  PCP or Specialist appointment within 3-5 days of discharge Complete - Complete  HRI or Home Care Consult Complete Complete Complete  SW Recovery Care/Counseling Consult Complete Complete Complete  Palliative Care Screening Complete Not Applicable Complete  [place] Not Applicable Not  Applicable Not Applicable  Some recent data might be hidden

## 2019-12-24 ENCOUNTER — Telehealth: Payer: Self-pay

## 2019-12-24 NOTE — Telephone Encounter (Signed)
Telephone call to patients home to schedule palliative care visit.  Patients daughter reports patient is home from the hospital.  Patient was admitted to hospice services last PM ([place].)

## 2020-01-09 ENCOUNTER — Encounter: Payer: Self-pay | Admitting: Student

## 2020-01-09 ENCOUNTER — Ambulatory Visit: Payer: PPO | Admitting: Student

## 2020-01-09 VITALS — BP 120/60 | HR 76 | Ht 64.5 in | Wt 123.4 lb

## 2020-01-09 DIAGNOSIS — I4819 Other persistent atrial fibrillation: Secondary | ICD-10-CM

## 2020-01-09 DIAGNOSIS — N183 Chronic kidney disease, stage 3 unspecified: Secondary | ICD-10-CM

## 2020-01-09 DIAGNOSIS — I5032 Chronic diastolic (congestive) heart failure: Secondary | ICD-10-CM | POA: Diagnosis not present

## 2020-01-09 DIAGNOSIS — Z79899 Other long term (current) drug therapy: Secondary | ICD-10-CM | POA: Diagnosis not present

## 2020-01-09 DIAGNOSIS — I38 Endocarditis, valve unspecified: Secondary | ICD-10-CM | POA: Diagnosis not present

## 2020-01-09 NOTE — Patient Instructions (Addendum)
Medication Instructions:   Continue current medication regimen.   Labwork:  BMET on Tuesday (either by Dr. [place] Crane and ask him to send up a copy of labs or have performed at [facility] or [facility]).   Testing/Procedures:  None  Follow-Up:  Follow-up with Dr. Domenic Polite or Bernerd Pho, PA-C in 2 months  Any Other Special Instructions Will Be Listed Below (If Applicable).  Limit daily fluid intake to less than 2 Liters per day. Please limit salt intake.  Please weight yourself every morning. Take an extra Torsemide tablet and call cardiology [phone]) if weight increases by 2 pounds overnight or 5 pounds in a single week.   If you need a refill on your cardiac medications before your next appointment, please call your pharmacy.

## 2020-01-09 NOTE — Progress Notes (Signed)
Cardiology Office Note    Date:  01/09/2020   ID:  Kristy Mckinney, DOB Jul 18, 1931, MRN [id number]  PCP:  Celene Squibb, MD  Cardiologist: Rozann Lesches, MD    Chief Complaint  Patient presents with  . Hospitalization Follow-up    History of Present Illness:    Kristy Mckinney is a 86 y.o. female with past medical history of persistentatrial fibrillation, chronic diastolic CHF, mild AS, mild MS, severe TR and chronic back painwho presents to the office today for hospital follow-up.  She was most recently admitted to [facility] last month for acute hypoxic respiratory failure in the setting of an acute on chronic diastolic CHF exacerbation. She did undergo thoracentesis on admission with 1 L of fluid removed. She responded well with IV Lasix and was transitioned to Torsemide 20 mg BID at discharge. She had a recorded output of at least -7.0 L and weight had declined from 135 lbs on admission to 127 lbs at the time of discharge (creatinine at 1.53 on 12/23/2019). She had been on Amiodarone prior to admission but given her persistent arrhythmia, this was discontinued. She was continued on Lopressor 25mg  BID and Cardizem CD 180mg  daily for rate-control. Was discharged home with [facility].   In talking with the patient and her daughter today, she reports overall doing well since hospital discharge. She is on 2L [place] at baseline and reports oxygen saturations have remained appropriate. Weight has declined to 123 lbs. She is now receiving Meals on Wheels and is unsure if they add salt to the food but weight has been stable on her scales as well and she denies any recent orthopnea, PND or edema. She does have baseline dyspnea on exertion but denies any recent chest pain. Occasional palpitations with exertion but symptoms resolve within several seconds once she stops to rest.    Past Medical History:  Diagnosis Date  . Anxiety   . Atrial fibrillation ([place])   . Chronic back pain     . Chronic diastolic heart failure ([place])   . Dementia ([place])   . Disorder of left temporomandibular joint   . Essential hypertension   . GERD (gastroesophageal reflux disease)   . History of bronchitis   . Iron deficiency anemia   . Irritable bowel syndrome with diarrhea   . Mitral stenosis   . Osteoporosis   . Pneumonia   . Pulmonary hypertension ([place])     Past Surgical History:  Procedure Laterality Date  . ABDOMINAL HYSTERECTOMY    . BACK SURGERY    . CHOLECYSTECTOMY N/A 01/29/2014   Procedure: LAPAROSCOPIC CHOLECYSTECTOMY;  Surgeon: Scherry Ran, MD;  Location: [facility];  Service: General;  Laterality: N/A;  . COLONOSCOPY N/A 10/27/2015   Procedure: COLONOSCOPY;  Surgeon: Rogene Houston, MD;  Location: [facility];  Service: Endoscopy;  Laterality: N/A;  215  . INTRAMEDULLARY (IM) NAIL INTERTROCHANTERIC Right 11/29/2018   Procedure: INTRAMEDULLARY (IM) NAIL INTERTROCHANTRIC FRACTURE;  Surgeon: Renette Butters, MD;  Location: [facility];  Service: Orthopedics;  Laterality: Right;  . TONSILLECTOMY    . YAG LASER APPLICATION Left 51/08/5850   Procedure: YAG LASER APPLICATION;  Surgeon: Elta Guadeloupe T. Gershon Crane, MD;  Location: [facility];  Service: Ophthalmology;  Laterality: Left;  left    Current Medications: Outpatient Medications Prior to Visit  Medication Sig Dispense Refill  . acetaminophen (TYLENOL) 325 MG tablet Take 2 tablets (650 mg total) by mouth every 4 (four) hours as needed for headache  or mild pain. 30 tablet 0  . apixaban (ELIQUIS) 2.5 MG TABS tablet Take 1 tablet (2.5 mg total) by mouth 2 (two) times daily. 60 tablet 1  . calcium-vitamin D (OS-CAL 500 + D) 500-200 MG-UNIT tablet Take 1 tablet by mouth 2 (two) times daily.    Marland Kitchen diltiazem (CARDIZEM CD) 180 MG 24 hr capsule Take 1 capsule (180 mg total) by mouth daily. 90 capsule 0  . ferrous sulfate 325 (65 FE) MG EC tablet Take 1 tablet (325 mg total) by mouth daily with breakfast. 30 tablet 3  . magnesium oxide (MAG-OX) 400  MG tablet Take 1 tablet (400 mg total) by mouth 3 (three) times daily. 90 tablet 0  . memantine (NAMENDA) 5 MG tablet Take 1 tablet (5 mg total) by mouth 2 (two) times daily. 180 tablet 0  . metoprolol tartrate (LOPRESSOR) 25 MG tablet Take 1 tablet (25 mg total) by mouth 2 (two) times daily. 60 tablet 3  . pantoprazole (PROTONIX) 40 MG tablet TAKE (1) TABLET BY MOUTH DAILY. 90 tablet 0  . potassium chloride 20 MEQ TBCR Take 20 mEq by mouth daily. 30 tablet 0  . QUEtiapine (SEROQUEL) 25 MG tablet Take 1 tablet (25 mg total) by mouth at bedtime. 30 tablet 0  . sertraline (ZOLOFT) 50 MG tablet Take 1 tablet (50 mg total) by mouth every morning. 30 tablet 0  . torsemide (DEMADEX) 20 MG tablet Take 1 tablet (20 mg total) by mouth 2 (two) times daily. 60 tablet 3  . HYDROcodone-acetaminophen (NORCO/VICODIN) 5-325 MG tablet Take 1 tablet by mouth every 4 (four) hours as needed.    Marland Kitchen LORazepam (ATIVAN) 0.5 MG tablet Take 0.5 mg by mouth at bedtime as needed.    Vladimir Faster Glycol-Propyl Glycol (SYSTANE OP) Place 1 drop into both eyes daily as needed. Dry Eyes  (Patient not taking: Reported on 01/09/2020)     No facility-administered medications prior to visit.     Allergies:   Penicillins, Biaxin [clarithromycin], Ciprofloxacin, Emetrol, Feldene [piroxicam], and Sulfa antibiotics   Social History   Socioeconomic History  . Marital status: Widowed    Spouse name: Not on file  . Number of children: Not on file  . Years of education: Not on file  . Highest education level: Not on file  Occupational History  . Occupation: retired  Tobacco Use  . Smoking status: Never Smoker  . Smokeless tobacco: Never Used  Vaping Use  . Vaping Use: Never used  Substance and Sexual Activity  . Alcohol use: No  . Drug use: No  . Sexual activity: Yes    Birth control/protection: Surgical  Other Topics Concern  . Not on file  Social History Narrative   Pt lives in an apartment complex, reports that she still  drives   Social Determinants of Health   Financial Resource Strain: Low Risk   . Difficulty of Paying Living Expenses: Not hard at all  Food Insecurity: No Food Insecurity  . Worried About Charity fundraiser in the Last Year: Never true  . Ran Out of Food in the Last Year: Never true  Transportation Needs: No Transportation Needs  . Lack of Transportation (Medical): No  . Lack of Transportation (Non-Medical): No  Physical Activity: Sufficiently Active  . Days of Exercise per Week: 5 days  . Minutes of Exercise per Session: 30 min  Stress: No Stress Concern Present  . Feeling of Stress : Not at all  Social Connections: Moderately Integrated  .  Frequency of Communication with Friends and Family: More than three times a week  . Frequency of Social Gatherings with Friends and Family: More than three times a week  . Attends Religious Services: More than 4 times per year  . Active Member of Clubs or Organizations: Yes  . Attends Archivist Meetings: More than 4 times per year  . Marital Status: Widowed     Family History:  The patient's family history includes Hypertension in her child.   Review of Systems:   Please see the history of present illness.     General:  No chills, fever, night sweats or weight changes.  Cardiovascular:  No chest pain, edema, orthopnea,  paroxysmal nocturnal dyspnea. Positive for dyspnea on exertion and palpitations.  Dermatological: No rash, lesions/masses Respiratory: No cough, dyspnea Urologic: No hematuria, dysuria Abdominal:   No nausea, vomiting, diarrhea, bright red blood per rectum, melena, or hematemesis Neurologic:  No visual changes, wkns, changes in mental status. All other systems reviewed and are otherwise negative except as noted above.   Physical Exam:    VS:  BP 120/60   Pulse 76   Ht 5' 4.5" (1.638 m)   Wt 123 lb 6.4 oz (56 kg)   SpO2 99%   BMI 20.85 kg/m    General: Well developed, well nourished,female appearing in  no acute distress. Head: Normocephalic, atraumatic, sclera non-icteric.  Neck: No carotid bruits. JVD not elevated.  Lungs: Respirations regular and unlabored, without wheezes or rales. On 2L [place].  Heart: Irregularly irregular. No S3 or S4.  No murmur, no rubs, or gallops appreciated. Abdomen: Soft, non-tender, non-distended. No obvious abdominal masses. Msk:  Strength and tone appear normal for age. No obvious joint deformities or effusions. Extremities: No clubbing or cyanosis. Trace lower extremity edema bilaterally.  Distal pedal pulses are 2+ bilaterally. Neuro: Alert and oriented X 3. Moves all extremities spontaneously. No focal deficits noted. Psych:  Responds to questions appropriately with a normal affect. Skin: No rashes or lesions noted  Wt Readings from Last 3 Encounters:  01/09/20 123 lb 6.4 oz (56 kg)  12/23/19 128 lb 12 oz (58.4 kg)  12/12/19 142 lb (64.4 kg)     Studies/Labs Reviewed:   EKG:  EKG is not ordered today.   Recent Labs: 09/10/2019: TSH 1.877 12/13/2019: ALT 32; B Natriuretic Peptide 902.0 12/18/2019: Magnesium 2.0 12/19/2019: Hemoglobin 9.8; Platelets 290 12/23/2019: BUN 37; Creatinine, Ser 1.53; Potassium 3.8; Sodium 136   Lipid Panel    Component Value Date/Time   CHOL 186 03/08/2018 0000   TRIG 83 03/08/2018 0000   HDL 63 03/08/2018 0000   LDLCALC 106 03/08/2018 0000    Additional studies/ records that were reviewed today include:   Echocardiogram: 12/2019 IMPRESSIONS    1. Left ventricular ejection fraction, by estimation, is 70 to 75%. The  left ventricle has hyperdynamic function. The left ventricle has no  regional wall motion abnormalities. Left ventricular diastolic parameters  are consistent with Grade II diastolic  dysfunction (pseudonormalization). Elevated left atrial pressure.  2. Right ventricular systolic function is normal. The right ventricular  size is normal. There is severely elevated pulmonary artery systolic  pressure.  The estimated right ventricular systolic pressure is 50.2 mmHg.  3. Left atrial size was moderately dilated.  4. Right atrial size was mildly dilated.  5. The mitral valve is degenerative. Trivial mitral valve regurgitation.  Moderate mitral stenosis. The mean mitral valve gradient is 8.0 mmHg.  6. Tricuspid valve regurgitation is  severe.  7. The aortic valve is tricuspid. Aortic valve regurgitation is not  visualized. Mild to moderate aortic valve sclerosis/calcification is  present, without any evidence of aortic stenosis. Aortic valve mean  gradient measures 7.0 mmHg. Aortic valve Vmax  measures 1.76 m/s.  8. The inferior vena cava is normal in size with <50% respiratory  variability, suggesting right atrial pressure of 8 mmHg.   Assessment:    1. Chronic diastolic congestive heart failure (HCC)   2. Persistent atrial fibrillation ([place])   3. Medication management   4. Valvular heart disease   5. Stage 3 chronic kidney disease, unspecified whether stage 3a or 3b CKD      Plan:   In order of problems listed above:  1. Chronic Diastolic CHF/Pulmonary HTN - She was recently admitted for an acute CHF exacerbation and responded well to IV Lasix with transition to Torsemide 20 mg BID at discharge. Weight had peaked at 135 lbs during admission, at 127 lbs upon discharge and at 123 lbs today. Will continue current diuretic dosing and she was informed to take an extra tablet for weight gain greater than 2 lbs overnight or 5 lbs in one week. Will recheck a BMET to assess renal function and electrolytes.  - PASP was elevated to 73.9 mmHg by most recent echocardiogram. She remains on 2L [place]. Aggressive evaluation has not been pursued given her advanced age and she is now being followed by [facility].   2. Persistent Atrial Fibrillation - Amiodarone was discontinued during her admission given her persistent arrhythmia. She denies any recent palpitations and HR is well-controlled in the  70's during today's visit. Continue rate-controlling medications with Cardizem CD 180mg  daily and Lopressor 25mg  BID.  - She denies any evidence of active bleeding. Remains on Eliquis 2.5mg  BID (reduced dosing given her age and renal function).   3. Valvular Heart Disease - Most recent echocardiogram showed moderate MS and severe TR. Will continue to follow.   4. Stage 3 CKD - Baseline creatinine 1.2 - 1.3. Peaked at 2.22 during recent admission, at 1.53 on discharge. Will recheck BMET to reassess renal function and electrolytes.     Medication Adjustments/Labs and Tests Ordered: Current medicines are reviewed at length with the patient today.  Concerns regarding medicines are outlined above.  Medication changes, Labs and Tests ordered today are listed in the Patient Instructions below. Patient Instructions  Medication Instructions:   Continue current medication regimen.   Labwork:  BMET on Tuesday (either by Dr. [place] Crane and ask him to send up a copy of labs or have performed at [facility] or [facility]).   Testing/Procedures:  None  Follow-Up:  Follow-up with Dr. Domenic Polite or Bernerd Pho, PA-C in 2 months  Any Other Special Instructions Will Be Listed Below (If Applicable).  Limit daily fluid intake to less than 2 Liters per day. Please limit salt intake.  Please weight yourself every morning. Take an extra Torsemide tablet and call cardiology [phone]) if weight increases by 2 pounds overnight or 5 pounds in a single week.   If you need a refill on your cardiac medications before your next appointment, please call your pharmacy.        Signed, LYNLEIGH KOVACK, PA-C  01/09/2020 8:06 PM    [place] [address] [place], [place] [address] Phone: [phone] Fax: [phone]

## 2020-01-12 DIAGNOSIS — I482 Chronic atrial fibrillation, unspecified: Secondary | ICD-10-CM | POA: Diagnosis not present

## 2020-01-12 DIAGNOSIS — F0390 Unspecified dementia without behavioral disturbance: Secondary | ICD-10-CM | POA: Diagnosis not present

## 2020-01-12 DIAGNOSIS — K219 Gastro-esophageal reflux disease without esophagitis: Secondary | ICD-10-CM | POA: Diagnosis not present

## 2020-01-12 DIAGNOSIS — F419 Anxiety disorder, unspecified: Secondary | ICD-10-CM | POA: Diagnosis not present

## 2020-01-12 DIAGNOSIS — I5032 Chronic diastolic (congestive) heart failure: Secondary | ICD-10-CM | POA: Diagnosis not present

## 2020-01-12 DIAGNOSIS — G4700 Insomnia, unspecified: Secondary | ICD-10-CM | POA: Diagnosis not present

## 2020-01-12 DIAGNOSIS — Z0189 Encounter for other specified special examinations: Secondary | ICD-10-CM | POA: Diagnosis not present

## 2020-01-12 DIAGNOSIS — M810 Age-related osteoporosis without current pathological fracture: Secondary | ICD-10-CM | POA: Diagnosis not present

## 2020-01-14 ENCOUNTER — Ambulatory Visit: Payer: PPO | Admitting: Family Medicine

## 2020-01-15 ENCOUNTER — Telehealth: Payer: Self-pay | Admitting: *Deleted

## 2020-01-15 DIAGNOSIS — Z79899 Other long term (current) drug therapy: Secondary | ICD-10-CM

## 2020-01-15 NOTE — Telephone Encounter (Signed)
Order placed

## 2020-01-15 NOTE — Telephone Encounter (Signed)
-----   Message from Erma Heritage, [place] sent at 01/15/2020  7:37 AM EDT ----- Please let the patient and her daughter know I reviewed her most recent labs from her PCP and her renal function has worsened since hospital discharge with creatinine going from 1.53 to 2.04.  If her PCP has not yet adjusted her diuretic regimen I would recommend she decrease her Torsemide from 20 mg twice daily to 20 mg daily alternating with 20mg  twice daily. Repeat BMET in 2 weeks for reassessment.

## 2020-01-27 ENCOUNTER — Other Ambulatory Visit: Payer: Self-pay | Admitting: Cardiology

## 2020-01-27 MED ORDER — POTASSIUM CHLORIDE ER 20 MEQ PO TBCR: 20.0000 meq | EXTENDED_RELEASE_TABLET | Freq: Every day | ORAL | 6 refills | Status: AC

## 2020-01-27 NOTE — Telephone Encounter (Signed)
New message     *STAT* If patient is at the pharmacy, call can be transferred to refill team.   1. Which medications need to be refilled? (please list name of each medication and dose if known)  potassium chloride 20 MEQ TBCR [[id number]]    2. Which pharmacy/location (including street and city if local pharmacy) is medication to be sent to? [facility]   3. Do they need a 30 day or 90 day supply?  [place]

## 2020-01-27 NOTE — Telephone Encounter (Signed)
Refilled

## 2020-02-02 ENCOUNTER — Ambulatory Visit (INDEPENDENT_AMBULATORY_CARE_PROVIDER_SITE_OTHER): Payer: PPO | Admitting: Gastroenterology

## 2020-02-05 ENCOUNTER — Ambulatory Visit (INDEPENDENT_AMBULATORY_CARE_PROVIDER_SITE_OTHER): Payer: PPO | Admitting: Gastroenterology

## 2020-03-11 ENCOUNTER — Ambulatory Visit: Payer: PPO | Admitting: Student

## 2020-03-11 NOTE — Progress Notes (Deleted)
Cardiology Office Note    Date:  03/11/2020   ID:  Kristy Mckinney, DOB Jun 24, 1932, MRN [id number]  PCP:  Celene Squibb, MD  Cardiologist: Rozann Lesches, MD    No chief complaint on file.   History of Present Illness:    Kristy Mckinney is a 86 y.o. female with past medical history of persistentatrial fibrillation, chronic diastolic CHF, valvular heart disease (mild AS, moderate MS, and severe TR) and chronic back pain who presents to the office today for 51-month follow-up.  She was last examined by myself in 01/2020 following a recent hospitalization at [facility] for an acute CHF exacerbation.  She responded well to IV Lasix during admission and have been discharged on torsemide 20 mg twice daily, Eliquis 2.5 mg twice daily, Lopressor 25 mg twice daily and Cardizem CD 180 mg daily.  At the time of her visit, she reported overall doing well and saturations remain appropriate on 2 L nasal cannula.  Weight was at 123 LBS and she reported her breathing was overall stable.  She was continued on her current medication regimen at that time and repeat labs were arranged.  Labs were obtained on 01/15/2020 and showed her creatinine had increased from 1.53 to 2.04.  It was recommended she reduce her Torsemide from 20 mg twice daily to 20 mg daily alternating with 20 mg twice daily with a repeat BMET in 2 weeks.     Past Medical History:  Diagnosis Date  . Anxiety   . Atrial fibrillation ([place])   . Chronic back pain   . Chronic diastolic heart failure ([place])   . Dementia ([place])   . Disorder of left temporomandibular joint   . Essential hypertension   . GERD (gastroesophageal reflux disease)   . History of bronchitis   . Iron deficiency anemia   . Irritable bowel syndrome with diarrhea   . Mitral stenosis   . Osteoporosis   . Pneumonia   . Pulmonary hypertension ([place])     Past Surgical History:  Procedure Laterality Date  . ABDOMINAL HYSTERECTOMY    . BACK SURGERY    .  CHOLECYSTECTOMY N/A 01/29/2014   Procedure: LAPAROSCOPIC CHOLECYSTECTOMY;  Surgeon: Scherry Ran, MD;  Location: [facility];  Service: General;  Laterality: N/A;  . COLONOSCOPY N/A 10/27/2015   Procedure: COLONOSCOPY;  Surgeon: Rogene Houston, MD;  Location: [facility];  Service: Endoscopy;  Laterality: N/A;  215  . INTRAMEDULLARY (IM) NAIL INTERTROCHANTERIC Right 11/29/2018   Procedure: INTRAMEDULLARY (IM) NAIL INTERTROCHANTRIC FRACTURE;  Surgeon: Renette Butters, MD;  Location: [place];  Service: Orthopedics;  Laterality: Right;  . TONSILLECTOMY    . YAG LASER APPLICATION Left 92/33/0076   Procedure: YAG LASER APPLICATION;  Surgeon: Elta Guadeloupe T. Gershon Crane, MD;  Location: [facility];  Service: Ophthalmology;  Laterality: Left;  left    Current Medications: Outpatient Medications Prior to Visit  Medication Sig Dispense Refill  . acetaminophen (TYLENOL) 325 MG tablet Take 2 tablets (650 mg total) by mouth every 4 (four) hours as needed for headache or mild pain. 30 tablet 0  . apixaban (ELIQUIS) 2.5 MG TABS tablet Take 1 tablet (2.5 mg total) by mouth 2 (two) times daily. 60 tablet 1  . calcium-vitamin D (OS-CAL 500 + D) 500-200 MG-UNIT tablet Take 1 tablet by mouth 2 (two) times daily.    Marland Kitchen diltiazem (CARDIZEM CD) 180 MG 24 hr capsule Take 1 capsule (180 mg total) by mouth daily. 90 capsule 0  .  ferrous sulfate 325 (65 FE) MG EC tablet Take 1 tablet (325 mg total) by mouth daily with breakfast. 30 tablet 3  . HYDROcodone-acetaminophen (NORCO/VICODIN) 5-325 MG tablet Take 1 tablet by mouth every 4 (four) hours as needed.    Marland Kitchen LORazepam (ATIVAN) 0.5 MG tablet Take 0.5 mg by mouth at bedtime as needed.    . magnesium oxide (MAG-OX) 400 MG tablet Take 1 tablet (400 mg total) by mouth 3 (three) times daily. 90 tablet 0  . memantine (NAMENDA) 5 MG tablet Take 1 tablet (5 mg total) by mouth 2 (two) times daily. 180 tablet 0  . metoprolol tartrate (LOPRESSOR) 25 MG tablet Take 1 tablet (25 mg total) by mouth  2 (two) times daily. 60 tablet 3  . pantoprazole (PROTONIX) 40 MG tablet TAKE (1) TABLET BY MOUTH DAILY. 90 tablet 0  . Polyethyl Glycol-Propyl Glycol (SYSTANE OP) Place 1 drop into both eyes daily as needed. Dry Eyes  (Patient not taking: Reported on 01/09/2020)    . Potassium Chloride ER 20 MEQ TBCR Take 20 mEq by mouth daily. 30 tablet 6  . QUEtiapine (SEROQUEL) 25 MG tablet Take 1 tablet (25 mg total) by mouth at bedtime. 30 tablet 0  . sertraline (ZOLOFT) 50 MG tablet Take 1 tablet (50 mg total) by mouth every morning. 30 tablet 0  . torsemide (DEMADEX) 20 MG tablet Take 1 tablet (20 mg total) by mouth 2 (two) times daily. 60 tablet 3   No facility-administered medications prior to visit.     Allergies:   Penicillins, Biaxin [clarithromycin], Ciprofloxacin, Emetrol, Feldene [piroxicam], and Sulfa antibiotics   Social History   Socioeconomic History  . Marital status: Widowed    Spouse name: Not on file  . Number of children: Not on file  . Years of education: Not on file  . Highest education level: Not on file  Occupational History  . Occupation: retired  Tobacco Use  . Smoking status: Never Smoker  . Smokeless tobacco: Never Used  Vaping Use  . Vaping Use: Never used  Substance and Sexual Activity  . Alcohol use: No  . Drug use: No  . Sexual activity: Yes    Birth control/protection: Surgical  Other Topics Concern  . Not on file  Social History Narrative   Pt lives in an apartment complex, reports that she still drives   Social Determinants of Health   Financial Resource Strain: Low Risk   . Difficulty of Paying Living Expenses: Not hard at all  Food Insecurity: No Food Insecurity  . Worried About Charity fundraiser in the Last Year: Never true  . Ran Out of Food in the Last Year: Never true  Transportation Needs: No Transportation Needs  . Lack of Transportation (Medical): No  . Lack of Transportation (Non-Medical): No  Physical Activity: Sufficiently Active  .  Days of Exercise per Week: 5 days  . Minutes of Exercise per Session: 30 min  Stress: No Stress Concern Present  . Feeling of Stress : Not at all  Social Connections: Moderately Integrated  . Frequency of Communication with Friends and Family: More than three times a week  . Frequency of Social Gatherings with Friends and Family: More than three times a week  . Attends Religious Services: More than 4 times per year  . Active Member of Clubs or Organizations: Yes  . Attends Archivist Meetings: More than 4 times per year  . Marital Status: Widowed     Family History:  The patient's ***family history includes Hypertension in her child.   Review of Systems:   Please see the history of present illness.     General:  No chills, fever, night sweats or weight changes.  Cardiovascular:  No chest pain, dyspnea on exertion, edema, orthopnea, palpitations, paroxysmal nocturnal dyspnea. Dermatological: No rash, lesions/masses Respiratory: No cough, dyspnea Urologic: No hematuria, dysuria Abdominal:   No nausea, vomiting, diarrhea, bright red blood per rectum, melena, or hematemesis Neurologic:  No visual changes, wkns, changes in mental status. All other systems reviewed and are otherwise negative except as noted above.   Physical Exam:    VS:  There were no vitals taken for this visit.   General: Well developed, well nourished,female appearing in no acute distress. Head: Normocephalic, atraumatic. Neck: No carotid bruits. JVD not elevated.  Lungs: Respirations regular and unlabored, without wheezes or rales.  Heart: ***Regular rate and rhythm. No S3 or S4.  No murmur, no rubs, or gallops appreciated. Abdomen: Appears non-distended. No obvious abdominal masses. Msk:  Strength and tone appear normal for age. No obvious joint deformities or effusions. Extremities: No clubbing or cyanosis. No edema.  Distal pedal pulses are 2+ bilaterally. Neuro: Alert and oriented X 3. Moves all  extremities spontaneously. No focal deficits noted. Psych:  Responds to questions appropriately with a normal affect. Skin: No rashes or lesions noted  Wt Readings from Last 3 Encounters:  01/09/20 123 lb 6.4 oz (56 kg)  12/23/19 128 lb 12 oz (58.4 kg)  12/12/19 142 lb (64.4 kg)        Studies/Labs Reviewed:   EKG:  EKG is*** ordered today.  The ekg ordered today demonstrates ***  Recent Labs: 09/10/2019: TSH 1.877 12/13/2019: ALT 32; B Natriuretic Peptide 902.0 12/18/2019: Magnesium 2.0 12/19/2019: Hemoglobin 9.8; Platelets 290 12/23/2019: BUN 37; Creatinine, Ser 1.53; Potassium 3.8; Sodium 136   Lipid Panel    Component Value Date/Time   CHOL 186 03/08/2018 0000   TRIG 83 03/08/2018 0000   HDL 63 03/08/2018 0000   LDLCALC 106 03/08/2018 0000    Additional studies/ records that were reviewed today include:   Echocardiogram: 12/2019 IMPRESSIONS    1. Left ventricular ejection fraction, by estimation, is 70 to 75%. The  left ventricle has hyperdynamic function. The left ventricle has no  regional wall motion abnormalities. Left ventricular diastolic parameters  are consistent with Grade II diastolic  dysfunction (pseudonormalization). Elevated left atrial pressure.  2. Right ventricular systolic function is normal. The right ventricular  size is normal. There is severely elevated pulmonary artery systolic  pressure. The estimated right ventricular systolic pressure is 85.0 mmHg.  3. Left atrial size was moderately dilated.  4. Right atrial size was mildly dilated.  5. The mitral valve is degenerative. Trivial mitral valve regurgitation.  Moderate mitral stenosis. The mean mitral valve gradient is 8.0 mmHg.  6. Tricuspid valve regurgitation is severe.  7. The aortic valve is tricuspid. Aortic valve regurgitation is not  visualized. Mild to moderate aortic valve sclerosis/calcification is  present, without any evidence of aortic stenosis. Aortic valve mean    gradient measures 7.0 mmHg. Aortic valve Vmax  measures 1.76 m/s.  8. The inferior vena cava is normal in size with <50% respiratory  variability, suggesting right atrial pressure of 8 mmHg.   Comparison(s): No significant change from prior study. Prior images  reviewed side by side.   Assessment:    No diagnosis found.   Plan:   In order of problems listed  above:  1. ***    Medication Adjustments/Labs and Tests Ordered: Current medicines are reviewed at length with the patient today.  Concerns regarding medicines are outlined above.  Medication changes, Labs and Tests ordered today are listed in the Patient Instructions below. There are no Patient Instructions on file for this visit.   Signed, HETVI SHAWHAN, PA-C  03/11/2020 10:12 AM    [facility]. [address] [place], [place] [address] Phone: [phone] Fax: [phone]

## 2020-04-12 ENCOUNTER — Inpatient Hospital Stay (HOSPITAL_COMMUNITY)
Admission: EM | Admit: 2020-04-12 | Discharge: 2020-05-10 | DRG: 951 | Disposition: E | Payer: Medicare Other | Attending: Internal Medicine | Admitting: Internal Medicine

## 2020-04-12 ENCOUNTER — Emergency Department (HOSPITAL_COMMUNITY): Payer: Medicare Other

## 2020-04-12 DIAGNOSIS — K219 Gastro-esophageal reflux disease without esophagitis: Secondary | ICD-10-CM | POA: Diagnosis present

## 2020-04-12 DIAGNOSIS — R0781 Pleurodynia: Secondary | ICD-10-CM | POA: Diagnosis not present

## 2020-04-12 DIAGNOSIS — M810 Age-related osteoporosis without current pathological fracture: Secondary | ICD-10-CM | POA: Diagnosis present

## 2020-04-12 DIAGNOSIS — Z9981 Dependence on supplemental oxygen: Secondary | ICD-10-CM

## 2020-04-12 DIAGNOSIS — G8929 Other chronic pain: Secondary | ICD-10-CM | POA: Diagnosis not present

## 2020-04-12 DIAGNOSIS — I959 Hypotension, unspecified: Secondary | ICD-10-CM | POA: Diagnosis not present

## 2020-04-12 DIAGNOSIS — I5020 Unspecified systolic (congestive) heart failure: Secondary | ICD-10-CM

## 2020-04-12 DIAGNOSIS — Z79899 Other long term (current) drug therapy: Secondary | ICD-10-CM

## 2020-04-12 DIAGNOSIS — N1832 Chronic kidney disease, stage 3b: Secondary | ICD-10-CM | POA: Diagnosis not present

## 2020-04-12 DIAGNOSIS — I110 Hypertensive heart disease with heart failure: Secondary | ICD-10-CM | POA: Diagnosis not present

## 2020-04-12 DIAGNOSIS — E860 Dehydration: Secondary | ICD-10-CM | POA: Diagnosis present

## 2020-04-12 DIAGNOSIS — A419 Sepsis, unspecified organism: Secondary | ICD-10-CM | POA: Diagnosis not present

## 2020-04-12 DIAGNOSIS — J189 Pneumonia, unspecified organism: Secondary | ICD-10-CM | POA: Diagnosis not present

## 2020-04-12 DIAGNOSIS — Z8249 Family history of ischemic heart disease and other diseases of the circulatory system: Secondary | ICD-10-CM

## 2020-04-12 DIAGNOSIS — E872 Acidosis, unspecified: Secondary | ICD-10-CM | POA: Diagnosis present

## 2020-04-12 DIAGNOSIS — F419 Anxiety disorder, unspecified: Secondary | ICD-10-CM | POA: Diagnosis present

## 2020-04-12 DIAGNOSIS — I130 Hypertensive heart and chronic kidney disease with heart failure and stage 1 through stage 4 chronic kidney disease, or unspecified chronic kidney disease: Secondary | ICD-10-CM | POA: Diagnosis not present

## 2020-04-12 DIAGNOSIS — I4821 Permanent atrial fibrillation: Secondary | ICD-10-CM | POA: Diagnosis present

## 2020-04-12 DIAGNOSIS — Z515 Encounter for palliative care: Secondary | ICD-10-CM | POA: Diagnosis not present

## 2020-04-12 DIAGNOSIS — D509 Iron deficiency anemia, unspecified: Secondary | ICD-10-CM | POA: Diagnosis not present

## 2020-04-12 DIAGNOSIS — I272 Pulmonary hypertension, unspecified: Secondary | ICD-10-CM | POA: Diagnosis not present

## 2020-04-12 DIAGNOSIS — I10 Essential (primary) hypertension: Secondary | ICD-10-CM | POA: Diagnosis present

## 2020-04-12 DIAGNOSIS — Z20822 Contact with and (suspected) exposure to covid-19: Secondary | ICD-10-CM | POA: Diagnosis not present

## 2020-04-12 DIAGNOSIS — R0902 Hypoxemia: Secondary | ICD-10-CM | POA: Diagnosis not present

## 2020-04-12 DIAGNOSIS — J90 Pleural effusion, not elsewhere classified: Secondary | ICD-10-CM | POA: Diagnosis present

## 2020-04-12 DIAGNOSIS — N179 Acute kidney failure, unspecified: Secondary | ICD-10-CM | POA: Diagnosis present

## 2020-04-12 DIAGNOSIS — Z7902 Long term (current) use of antithrombotics/antiplatelets: Secondary | ICD-10-CM

## 2020-04-12 DIAGNOSIS — M549 Dorsalgia, unspecified: Secondary | ICD-10-CM | POA: Diagnosis present

## 2020-04-12 DIAGNOSIS — Z66 Do not resuscitate: Secondary | ICD-10-CM | POA: Diagnosis present

## 2020-04-12 DIAGNOSIS — Z888 Allergy status to other drugs, medicaments and biological substances status: Secondary | ICD-10-CM

## 2020-04-12 DIAGNOSIS — I5033 Acute on chronic diastolic (congestive) heart failure: Secondary | ICD-10-CM | POA: Diagnosis not present

## 2020-04-12 DIAGNOSIS — F0390 Unspecified dementia without behavioral disturbance: Secondary | ICD-10-CM | POA: Diagnosis not present

## 2020-04-12 DIAGNOSIS — Z79891 Long term (current) use of opiate analgesic: Secondary | ICD-10-CM

## 2020-04-12 DIAGNOSIS — Z881 Allergy status to other antibiotic agents status: Secondary | ICD-10-CM

## 2020-04-12 DIAGNOSIS — Z882 Allergy status to sulfonamides status: Secondary | ICD-10-CM

## 2020-04-12 DIAGNOSIS — R000 Tachycardia, unspecified: Secondary | ICD-10-CM | POA: Diagnosis not present

## 2020-04-12 DIAGNOSIS — Z880 Allergy status to penicillin: Secondary | ICD-10-CM

## 2020-04-12 LAB — CBC WITH DIFFERENTIAL/PLATELET
Abs Immature Granulocytes: 0.27 10*3/uL — ABNORMAL HIGH (ref 0.00–0.07)
Basophils Absolute: 0 10*3/uL (ref 0.0–0.1)
Basophils Relative: 0 %
Eosinophils Absolute: 0 10*3/uL (ref 0.0–0.5)
Eosinophils Relative: 0 %
HCT: 34.2 % — ABNORMAL LOW (ref 36.0–46.0)
Hemoglobin: 10.3 g/dL — ABNORMAL LOW (ref 12.0–15.0)
Immature Granulocytes: 2 %
Lymphocytes Relative: 4 %
Lymphs Abs: 0.7 10*3/uL (ref 0.7–4.0)
MCH: 27.2 pg (ref 26.0–34.0)
MCHC: 30.1 g/dL (ref 30.0–36.0)
MCV: 90.5 fL (ref 80.0–100.0)
Monocytes Absolute: 0.9 10*3/uL (ref 0.1–1.0)
Monocytes Relative: 5 %
Neutro Abs: 14.9 10*3/uL — ABNORMAL HIGH (ref 1.7–7.7)
Neutrophils Relative %: 89 %
Platelets: 177 10*3/uL (ref 150–400)
RBC: 3.78 MIL/uL — ABNORMAL LOW (ref 3.87–5.11)
RDW: 15.1 % (ref 11.5–15.5)
WBC: 16.8 10*3/uL — ABNORMAL HIGH (ref 4.0–10.5)
nRBC: 0 % (ref 0.0–0.2)

## 2020-04-12 LAB — COMPREHENSIVE METABOLIC PANEL
ALT: 81 U/L — ABNORMAL HIGH (ref 0–44)
AST: 148 U/L — ABNORMAL HIGH (ref 15–41)
Albumin: 3.5 g/dL (ref 3.5–5.0)
Alkaline Phosphatase: 121 U/L (ref 38–126)
Anion gap: 17 — ABNORMAL HIGH (ref 5–15)
BUN: 52 mg/dL — ABNORMAL HIGH (ref 8–23)
CO2: 24 mmol/L (ref 22–32)
Calcium: 9.7 mg/dL (ref 8.9–10.3)
Chloride: 93 mmol/L — ABNORMAL LOW (ref 98–111)
Creatinine, Ser: 3.23 mg/dL — ABNORMAL HIGH (ref 0.44–1.00)
GFR calc Af Amer: 14 mL/min — ABNORMAL LOW (ref >60)
GFR calc non Af Amer: 12 mL/min — ABNORMAL LOW (ref >60)
Glucose, Bld: 129 mg/dL — ABNORMAL HIGH (ref 70–99)
Potassium: 5.2 mmol/L — ABNORMAL HIGH (ref 3.5–5.1)
Sodium: 134 mmol/L — ABNORMAL LOW (ref 135–145)
Total Bilirubin: 1.7 mg/dL — ABNORMAL HIGH (ref 0.3–1.2)
Total Protein: 6.5 g/dL (ref 6.5–8.1)

## 2020-04-12 LAB — APTT: aPTT: 45 seconds — ABNORMAL HIGH (ref 24–36)

## 2020-04-12 LAB — RESPIRATORY PANEL BY RT PCR (FLU A&B, COVID)
Influenza A by PCR: NEGATIVE
Influenza B by PCR: NEGATIVE
SARS Coronavirus 2 by RT PCR: NEGATIVE

## 2020-04-12 LAB — PROTIME-INR
INR: 2.5 — ABNORMAL HIGH (ref 0.8–1.2)
Prothrombin Time: 26.5 seconds — ABNORMAL HIGH (ref 11.4–15.2)

## 2020-04-12 LAB — TROPONIN I (HIGH SENSITIVITY)
Troponin I (High Sensitivity): 28 ng/L — ABNORMAL HIGH (ref <18)
Troponin I (High Sensitivity): 32 ng/L — ABNORMAL HIGH (ref <18)

## 2020-04-12 LAB — AMMONIA: Ammonia: 23 umol/L (ref 9–35)

## 2020-04-12 LAB — BRAIN NATRIURETIC PEPTIDE: B Natriuretic Peptide: 2156 pg/mL — ABNORMAL HIGH (ref 0.0–100.0)

## 2020-04-12 LAB — LACTIC ACID, PLASMA
Lactic Acid, Venous: 5.4 mmol/L (ref 0.5–1.9)
Lactic Acid, Venous: 3.5 mmol/L (ref 0.5–1.9)

## 2020-04-12 LAB — ETHANOL: Alcohol, Ethyl (B): <10 mg/dL (ref <10)

## 2020-04-12 LAB — CK: Total CK: 234 U/L (ref 38–234)

## 2020-04-12 MED ORDER — LORAZEPAM 1 MG PO TABS: 1.0000 mg | ORAL_TABLET | ORAL | Status: DC | PRN

## 2020-04-12 MED ORDER — METRONIDAZOLE IN NACL 5-0.79 MG/ML-% IV SOLN
500.0000 mg | Freq: Once | INTRAVENOUS | Status: AC
  Administered 2020-04-12: 500 mg via INTRAVENOUS
  Filled 2020-04-12: qty 100

## 2020-04-12 MED ORDER — LORAZEPAM 2 MG/ML PO CONC: 1.0000 mg | ORAL | Status: DC | PRN

## 2020-04-12 MED ORDER — HALOPERIDOL LACTATE 2 MG/ML PO CONC: 0.5000 mg | ORAL | Status: DC | PRN

## 2020-04-12 MED ORDER — HALOPERIDOL LACTATE 5 MG/ML IJ SOLN: 0.5000 mg | INTRAMUSCULAR | Status: DC | PRN

## 2020-04-12 MED ORDER — SODIUM CHLORIDE 0.9 % IV BOLUS
1000.0000 mL | Freq: Once | INTRAVENOUS | Status: AC
  Administered 2020-04-12: 1000 mL via INTRAVENOUS

## 2020-04-12 MED ORDER — LACTATED RINGERS IV SOLN: INTRAVENOUS | Status: DC

## 2020-04-12 MED ORDER — ONDANSETRON 4 MG PO TBDP: 4.0000 mg | ORAL_TABLET | Freq: Four times a day (QID) | ORAL | Status: DC | PRN

## 2020-04-12 MED ORDER — GLYCOPYRROLATE 1 MG PO TABS: 1.0000 mg | ORAL_TABLET | ORAL | Status: DC | PRN

## 2020-04-12 MED ORDER — LORAZEPAM 2 MG/ML IJ SOLN: 1.0000 mg | INTRAMUSCULAR | Status: DC | PRN

## 2020-04-12 MED ORDER — SODIUM CHLORIDE 0.9 % IV SOLN: 2.0000 g | Freq: Once | INTRAVENOUS | Status: DC

## 2020-04-12 MED ORDER — SODIUM CHLORIDE 0.9 % IV BOLUS: 2000.0000 mL | Freq: Once | INTRAVENOUS | Status: DC

## 2020-04-12 MED ORDER — ACETAMINOPHEN 650 MG RE SUPP: 650.0000 mg | Freq: Four times a day (QID) | RECTAL | Status: DC | PRN

## 2020-04-12 MED ORDER — BIOTENE DRY MOUTH MT LIQD: 15.0000 mL | OROMUCOSAL | Status: DC | PRN

## 2020-04-12 MED ORDER — VANCOMYCIN HCL IN DEXTROSE 1-5 GM/200ML-% IV SOLN
1000.0000 mg | Freq: Once | INTRAVENOUS | Status: AC
  Administered 2020-04-12: 1000 mg via INTRAVENOUS
  Filled 2020-04-12: qty 200

## 2020-04-12 MED ORDER — MORPHINE SULFATE (PF) 2 MG/ML IV SOLN
1.0000 mg | INTRAVENOUS | Status: DC | PRN
  Administered 2020-04-12: 1 mg via INTRAVENOUS
  Filled 2020-04-12 (×2): qty 1

## 2020-04-12 MED ORDER — GLYCOPYRROLATE 0.2 MG/ML IJ SOLN: 0.2000 mg | INTRAMUSCULAR | Status: DC | PRN

## 2020-04-12 MED ORDER — SODIUM CHLORIDE 0.9 % IV SOLN
2.0000 g | Freq: Once | INTRAVENOUS | Status: AC
  Administered 2020-04-12: 2 g via INTRAVENOUS
  Filled 2020-04-12: qty 2

## 2020-04-12 MED ORDER — HALOPERIDOL 0.5 MG PO TABS: 0.5000 mg | ORAL_TABLET | ORAL | Status: DC | PRN

## 2020-04-12 MED ORDER — ACETAMINOPHEN 325 MG PO TABS: 650.0000 mg | ORAL_TABLET | Freq: Four times a day (QID) | ORAL | Status: DC | PRN

## 2020-04-12 MED ORDER — POLYVINYL ALCOHOL 1.4 % OP SOLN: 1.0000 [drp] | Freq: Four times a day (QID) | OPHTHALMIC | Status: DC | PRN

## 2020-04-12 MED ORDER — ONDANSETRON HCL 4 MG/2ML IJ SOLN: 4.0000 mg | Freq: Four times a day (QID) | INTRAMUSCULAR | Status: DC | PRN

## 2020-04-17 LAB — CULTURE, BLOOD (ROUTINE X 2)
Culture: NO GROWTH
Culture: NO GROWTH
Special Requests: ADEQUATE

## 2020-05-10 NOTE — Clinical Social Work Note (Signed)
[facility] RN to come and assess patient.     Kristy Mckinney, Clydene Pugh, LCSW

## 2020-05-10 NOTE — ED Notes (Signed)
ED TO INPATIENT HANDOFF REPORT  ED Nurse Name and Phone #:  [phone]  S Name/Age/Gender Kristy Mckinney 86 y.o. female Room/Bed: APA09/APA09  Code Status   Code Status: DNR  Home/SNF/Other Home  Is this baseline? Yes   Triage Complete: Triage complete  Chief Complaint AKI (acute kidney injury) ([place]) [N17.9]  Triage Note Pt. From home. [place] last night family is not sure when they last spoke to pt around 9 pm. Family found pt. On the floor this morning with AMS. EMS called Hospice. Pt. Is in hospice for heart failure. EMS states pt. Was 60 O2 on RA. EMS placed pt. On 4 L of O2 pts. O2 was 100 after. Pt. States they are have lower back pain. Pt. Has chronic back pain but states it hurts more than usual. Pt. Denies hitting head during fall. No signs of abrasions or bumps on head during assessment.    Allergies Allergies  Allergen Reactions  . Penicillins Anaphylaxis    Did it involve swelling of the face/tongue/throat, SOB, or low BP? Unknown Did it involve sudden or severe rash/hives, skin peeling, or any reaction on the inside of your mouth or nose? Unknown Did you need to seek medical attention at a hospital or doctor's office? Unknown When did it last happen? If all above answers are "NO", may proceed with cephalosporin use.   . Biaxin [Clarithromycin]   . Ciprofloxacin Nausea And Vomiting  . Emetrol Nausea Only    swelling  . Feldene [Piroxicam] Nausea And Vomiting  . Sulfa Antibiotics     swelling    Level of Care/Admitting Diagnosis ED Disposition    ED Disposition Condition [facility] Area: [facility] [[id number]]  Level of Care: Med-Surg [16]  Covid Evaluation: Asymptomatic Screening Protocol (No Symptoms)  Diagnosis: AKI (acute kidney injury) [facility]) [[id number]]  Admitting Physician: [place], [place]  Attending Physician: Kathie Dike [[id number]]  Estimated length of stay: past midnight tomorrow  Certification:: I certify this  patient will need inpatient services for at least 2 midnights       B Medical/Surgery History Past Medical History:  Diagnosis Date  . Anxiety   . Atrial fibrillation ([place])   . Chronic back pain   . Chronic diastolic heart failure ([place])   . Dementia ([place])   . Disorder of left temporomandibular joint   . Essential hypertension   . GERD (gastroesophageal reflux disease)   . History of bronchitis   . Iron deficiency anemia   . Irritable bowel syndrome with diarrhea   . Mitral stenosis   . Osteoporosis   . Pneumonia   . Pulmonary hypertension ([place])    Past Surgical History:  Procedure Laterality Date  . ABDOMINAL HYSTERECTOMY    . BACK SURGERY    . CHOLECYSTECTOMY N/A 01/29/2014   Procedure: LAPAROSCOPIC CHOLECYSTECTOMY;  Surgeon: Scherry Ran, MD;  Location: [facility];  Service: General;  Laterality: N/A;  . COLONOSCOPY N/A 10/27/2015   Procedure: COLONOSCOPY;  Surgeon: Rogene Houston, MD;  Location: [facility];  Service: Endoscopy;  Laterality: N/A;  215  . INTRAMEDULLARY (IM) NAIL INTERTROCHANTERIC Right 11/29/2018   Procedure: INTRAMEDULLARY (IM) NAIL INTERTROCHANTRIC FRACTURE;  Surgeon: Renette Butters, MD;  Location: [place];  Service: Orthopedics;  Laterality: Right;  . TONSILLECTOMY    . YAG LASER APPLICATION Left 17/40/8144   Procedure: YAG LASER APPLICATION;  Surgeon: Elta Guadeloupe T. Gershon Crane, MD;  Location: [facility];  Service: Ophthalmology;  Laterality: Left;  left  A IV Location/Drains/Wounds Patient Lines/Drains/Airways Status    Active Line/Drains/Airways    Name Placement date Placement time Site Days   Peripheral IV 2020/05/08 Right Antecubital 05/08/2020  1148  Antecubital  less than 1   Peripheral IV 05-08-2020 Left Hand May 08, 2020  1202  Hand  less than 1   External Urinary Catheter 12/15/19  0900  --  119   External Urinary Catheter May 08, 2020  1320  --  less than 1          Intake/Output Last 24 hours  Intake/Output Summary (Last 24 hours) at May 08, 2020  1632 Last data filed at 2020/05/08 1320 Gross per 24 hour  Intake 1200 ml  Output --  Net 1200 ml    Labs/Imaging Results for orders placed or performed during the hospital encounter of 05-08-2020 (from the past 48 hour(s))  Lactic acid, plasma     Status: Abnormal   Collection Time: 2020-05-08 11:48 AM  Result Value Ref Range   Lactic Acid, Venous 5.4 (HH) 0.5 - 1.9 mmol/L    Comment: CRITICAL RESULT CALLED TO, READ BACK BY AND VERIFIED WITH: MYRICK,B AT 12:35PM ON 2020-05-08 BY [facility] Performed at [facility], [address] [place], [place] [address]   Comprehensive metabolic panel     Status: Abnormal   Collection Time: 05-08-2020 11:48 AM  Result Value Ref Range   Sodium 134 (L) 135 - 145 mmol/L   Potassium 5.2 (H) 3.5 - 5.1 mmol/L   Chloride 93 (L) 98 - 111 mmol/L   CO2 24 22 - 32 mmol/L   Glucose, Bld 129 (H) 70 - 99 mg/dL    Comment: Glucose reference range applies only to samples taken after fasting for at least 8 hours.   BUN 52 (H) 8 - 23 mg/dL   Creatinine, Ser 3.23 (H) 0.44 - 1.00 mg/dL   Calcium 9.7 8.9 - 10.3 mg/dL   Total Protein 6.5 6.5 - 8.1 g/dL   Albumin 3.5 3.5 - 5.0 g/dL   AST 148 (H) 15 - 41 U/L   ALT 81 (H) 0 - 44 U/L   Alkaline Phosphatase 121 38 - 126 U/L   Total Bilirubin 1.7 (H) 0.3 - 1.2 mg/dL   GFR calc non Af Amer 12 (L) >60 mL/min   GFR calc Af Amer 14 (L) >60 mL/min   Anion gap 17 (H) 5 - 15    Comment: Performed at [facility], [address], [place], [place] [address]  CBC WITH DIFFERENTIAL     Status: Abnormal   Collection Time: 05-08-20 11:48 AM  Result Value Ref Range   WBC 16.8 (H) 4.0 - 10.5 K/uL   RBC 3.78 (L) 3.87 - 5.11 MIL/uL   Hemoglobin 10.3 (L) 12.0 - 15.0 g/dL   HCT 34.2 (L) 36 - 46 %   MCV 90.5 80.0 - 100.0 fL   MCH 27.2 26.0 - 34.0 pg   MCHC 30.1 30.0 - 36.0 g/dL   RDW 15.1 11.5 - 15.5 %   Platelets 177 150 - 400 K/uL   nRBC 0.0 0.0 - 0.2 %   Neutrophils Relative % 89 %   Neutro Abs 14.9 (H) 1.7 - 7.7 K/uL    Lymphocytes Relative 4 %   Lymphs Abs 0.7 0.7 - 4.0 K/uL   Monocytes Relative 5 %   Monocytes Absolute 0.9 0 - 1 K/uL   Eosinophils Relative 0 %   Eosinophils Absolute 0.0 0 - 0 K/uL   Basophils Relative 0 %   Basophils  Absolute 0.0 0 - 0 K/uL   Immature Granulocytes 2 %   Abs Immature Granulocytes 0.27 (H) 0.00 - 0.07 K/uL    Comment: Performed at [facility], [address] [place], [place] [address]  Protime-INR     Status: Abnormal   Collection Time: 2020/05/12 11:48 AM  Result Value Ref Range   Prothrombin Time 26.5 (H) 11.4 - 15.2 seconds   INR 2.5 (H) 0.8 - 1.2    Comment: (NOTE) INR goal varies based on device and disease states. Performed at [facility], [address] [place], [place] [address]   APTT     Status: Abnormal   Collection Time: May 12, 2020 11:48 AM  Result Value Ref Range   aPTT 45 (H) 24 - 36 seconds    Comment:        IF BASELINE aPTT IS ELEVATED, SUGGEST PATIENT RISK ASSESSMENT BE USED TO DETERMINE APPROPRIATE ANTICOAGULANT THERAPY. Performed at [facility], [address], [place], [place] [address]   CK     Status: None   Collection Time: 05/12/20 11:48 AM  Result Value Ref Range   Total CK 234 38.0 - 234.0 U/L    Comment: Performed at [facility], [address], [place], [place] [address]  Troponin I (High Sensitivity)     Status: Abnormal   Collection Time: 2020/05/12 11:48 AM  Result Value Ref Range   Troponin I (High Sensitivity) 28 (H) <18 ng/L    Comment: (NOTE) Elevated high sensitivity troponin I (hsTnI) values and significant  changes across serial measurements may suggest ACS but many other  chronic and acute conditions are known to elevate hsTnI results.  Refer to the "Links" section for chest pain algorithms and additional  guidance. Performed at [facility], [address] [place], [place] [address]   Ethanol     Status: None   Collection Time: 2020/05/12 11:48 AM  Result Value Ref Range   Alcohol, Ethyl (B) <10 <10 mg/dL     Comment: (NOTE) Lowest detectable limit for serum alcohol is 10 mg/dL.  For medical purposes only. Performed at [facility], [address], [place], [place] [address]   Ammonia     Status: None   Collection Time: 05-12-2020 11:48 AM  Result Value Ref Range   Ammonia 23 9 - 35 umol/L    Comment: Performed at [facility], [address], [place], [place] [address]  Brain natriuretic peptide     Status: Abnormal   Collection Time: 05/12/2020 11:49 AM  Result Value Ref Range   B Natriuretic Peptide 2,156.0 (H) 0.0 - 100.0 pg/mL    Comment: Performed at [facility], [address] [place], [place] [address]  Respiratory Panel by RT PCR (Flu A&B, Covid) - Nasopharyngeal Swab     Status: None   Collection Time: 2020/05/12 11:50 AM   Specimen: Nasopharyngeal Swab  Result Value Ref Range   SARS Coronavirus 2 by RT PCR NEGATIVE NEGATIVE    Comment: (NOTE) SARS-CoV-2 target nucleic acids are NOT DETECTED.  The SARS-CoV-2 RNA is generally detectable in upper respiratoy specimens during the acute phase of infection. The lowest concentration of SARS-CoV-2 viral copies this assay can detect is 131 copies/mL. A negative result does not preclude SARS-Cov-2 infection and should not be used as the sole basis for treatment or other patient management decisions. A negative result may occur with  improper specimen collection/handling, submission of specimen other than nasopharyngeal swab, presence of viral mutation(s) within the areas targeted by this assay,  and inadequate number of viral copies (<131 copies/mL). A negative result must be combined with clinical observations, patient history, and epidemiological information. The expected result is Negative.  Fact Sheet for Patients:  PinkCheek.be  Fact Sheet for Healthcare Providers:  GravelBags.it  This test is no t yet approved or cleared by the [place] FDA and  has been authorized for  detection and/or diagnosis of SARS-CoV-2 by FDA under an Emergency Use Authorization (EUA). This EUA will remain  in effect (meaning this test can be used) for the duration of the COVID-19 declaration under Section 564(b)(1) of the Act, 21 U.S.C. section 360bbb-3(b)(1), unless the authorization is terminated or revoked sooner.     Influenza A by PCR NEGATIVE NEGATIVE   Influenza B by PCR NEGATIVE NEGATIVE    Comment: (NOTE) The Xpert Xpress SARS-CoV-2/FLU/RSV assay is intended as an aid in  the diagnosis of influenza from Nasopharyngeal swab specimens and  should not be used as a sole basis for treatment. Nasal washings and  aspirates are unacceptable for Xpert Xpress SARS-CoV-2/FLU/RSV  testing.  Fact Sheet for Patients: PinkCheek.be  Fact Sheet for Healthcare Providers: GravelBags.it  This test is not yet approved or cleared by the [place] FDA and  has been authorized for detection and/or diagnosis of SARS-CoV-2 by  FDA under an Emergency Use Authorization (EUA). This EUA will remain  in effect (meaning this test can be used) for the duration of the  Covid-19 declaration under Section 564(b)(1) of the Act, 21  U.S.C. section 360bbb-3(b)(1), unless the authorization is  terminated or revoked. Performed at [facility], [address] [place], [place] [address]   Blood Culture (routine x 2)     Status: None (Preliminary result)   Collection Time: 05/01/20 12:05 PM   Specimen: BLOOD  Result Value Ref Range   Specimen Description BLOOD    Special Requests NONE    Culture      NO GROWTH < 12 HOURS Performed at [facility], [address] [place], [place] [address]    Report Status PENDING   Blood Culture (routine x 2)     Status: None (Preliminary result)   Collection Time: 2020-05-01 12:05 PM   Specimen: BLOOD  Result Value Ref Range   Specimen Description BLOOD    Special Requests NONE    Culture      NO  GROWTH < 12 HOURS Performed at [facility], [address], [place], [place] [address]    Report Status PENDING   Lactic acid, plasma     Status: Abnormal   Collection Time: May 01, 2020  1:15 PM  Result Value Ref Range   Lactic Acid, Venous 3.5 (HH) 0.5 - 1.9 mmol/L    Comment: CRITICAL RESULT CALLED TO, READ BACK BY AND VERIFIED WITH: MYRICK,B AT 1423 ON 01-May-2020 BY ISLEY,B Performed at [facility], [address] [place], [place] [address]    DG Chest Port 1 View  Result Date: 05-01-2020 CLINICAL DATA:  Sepsis EXAM: PORTABLE CHEST 1 VIEW COMPARISON:  12/15/2019 FINDINGS: Grossly stable cardiomediastinal contours. Atherosclerotic calcification of the aortic knob. Chronic small to moderate right-sided pleural effusion. Suspect small left-sided pleural effusion. Patchy opacities within the mid to lower lung fields bilaterally. No pneumothorax. IMPRESSION: 1. Chronic small to moderate right-sided pleural effusion. 2. Suspect small left-sided pleural effusion. 3. Patchy opacities within the mid to lower lung fields bilaterally, atelectasis versus pneumonia. Electronically Signed   By: Davina Poke D.O.   On: 2020/05/01 12:15  Pending Labs Unresulted Labs (From admission, onward)          Start     Ordered   2020/05/03 1148  Urinalysis, Routine w reflex microscopic  (Septic presentation on arrival (screening labs, nursing and treatment orders for obvious sepsis))  ONCE - STAT,   STAT        2020-05-03 1147   2020/05/03 1148  Urine culture  (Septic presentation on arrival (screening labs, nursing and treatment orders for obvious sepsis))  ONCE - STAT,   STAT        05/03/20 1147          Vitals/Pain Today's Vitals   2020-05-03 1215 05-03-2020 1230 05-03-20 1300 05-03-20 1330  BP:  (!) 85/57 106/70 105/63  Pulse: 92 87 91 86  Resp: 16 16 17 17   Temp:      TempSrc:      SpO2: 95% 96% 96% 98%  Weight:      PainSc:        Isolation Precautions No active  isolations  Medications Medications  metroNIDAZOLE (FLAGYL) IVPB 500 mg (0 mg Intravenous Stopped May 03, 2020 1320)  vancomycin (VANCOCIN) IVPB 1000 mg/200 mL premix (1,000 mg Intravenous New Bag/Given May 03, 2020 1243)  sodium chloride 0.9 % bolus 1,000 mL (0 mLs Intravenous Stopped May 03, 2020 1242)  ceFEPIme (MAXIPIME) 2 g in sodium chloride 0.9 % 100 mL IVPB (0 g Intravenous Stopped 2020/05/03 1238)    Mobility non-ambulatory High fall risk   Focused Assessments    R Recommendations: See Admitting Provider Note  Report given to:   Additional Notes:

## 2020-05-10 NOTE — Clinical Social Work Note (Signed)
Hospice nurse says that they have no beds at [facility]. Advised that since patient is alreaday a hospice patient they cannot admit her under GIP. [facility] attending can admit and when a hospice bed is available she can go. Hospice RN is in patient's room. Attending messaged with these details.    Carlena Ruybal, Clydene Pugh, LCSW

## 2020-05-10 NOTE — Sepsis Progress Note (Signed)
Secure chat with provider and he is going to d/c the sepsis protocol d/t going to comfort care.

## 2020-05-10 NOTE — Progress Notes (Signed)
Patient arrived from the ED.  Nurse attempted taking vitals signs but couldn't determine pulse and O2 sat was 73%.  Nurse consulted another nurse and we talked to the family.  The daughter refused vital signs and the heart monitor and asked that she be put on comfort care.  The provider was notified.

## 2020-05-10 NOTE — ED Provider Notes (Signed)
[facility] EMERGENCY DEPARTMENT Provider Note   CSN: [id number] Arrival date & time: May 10, 2020  1058     History Chief Complaint  Patient presents with  . [place] is a 86 y.o. female.  Patient fell on the ground.  Patient is a hospice patient with a DNR with severe heart failure and patient is usually on oxygen.  The history is provided by the patient and medical records. No language interpreter was used.  Fall This is a new problem. The current episode started 12 to 24 hours ago. The problem occurs constantly. The problem has not changed since onset.Pertinent negatives include no chest pain. Nothing relieves the symptoms. She has tried nothing for the symptoms. The treatment provided no relief.       Past Medical History:  Diagnosis Date  . Anxiety   . Atrial fibrillation ([place])   . Chronic back pain   . Chronic diastolic heart failure ([place])   . Dementia ([place])   . Disorder of left temporomandibular joint   . Essential hypertension   . GERD (gastroesophageal reflux disease)   . History of bronchitis   . Iron deficiency anemia   . Irritable bowel syndrome with diarrhea   . Mitral stenosis   . Osteoporosis   . Pneumonia   . Pulmonary hypertension [facility])     Patient Active Problem List   Diagnosis Date Noted  . AKI (acute kidney injury) ([place]) 10-May-2020  . Encounter for hospice care discussion   . Status post thoracentesis   . DNR (do not resuscitate)   . Acute exacerbation of CHF (congestive heart failure) ([place]) 12/14/2019  . Syncope and collapse 12/14/2019  . Acute respiratory failure ([place]) 10/12/2019  . Elevated glucose 08/20/2019  . Degenerative lumbar disc 08/20/2019  . Left hip pain 08/20/2019  . Osteoporosis   . Anemia, unspecified   . Anxiety disorder, unspecified   . Vitamin D deficiency, unspecified   . Essential (primary) hypertension   . Abdominal distension (gaseous)   . Disorder of the skin and subcutaneous tissue, unspecified     . Gastro-esophageal reflux disease with esophagitis   . Iron deficiency anemia, unspecified   . Irritable bowel syndrome with diarrhea   . Left temporomandibular joint disorder, unspecified   . Unspecified dementia without behavioral disturbance ([place])   . Unspecified fracture of unspecified wrist and hand, initial encounter for closed fracture   . Advanced care planning/counseling discussion   . Acute on chronic diastolic congestive heart failure ([place]) 05/02/2019  . Acute on chronic diastolic CHF (congestive heart failure) ([place]) 05/02/2019  . Palliative care by specialist   . Goals of care, counseling/discussion   . Generalized weakness   . Pleural effusion 04/13/2019  . Pancreatitis, acute 04/13/2019  . Acute respiratory failure with hypoxia ([place]) 04/13/2019  . Bradycardia 04/10/2019  . Hypotension 04/10/2019  . Hyperkalemia 04/10/2019  . ARF (acute renal failure) ([place]) 04/10/2019  . Atrial fibrillation ([place]) 04/06/2019  . A-fib ([place]) 04/04/2019  . Dementia ([place]) 12/12/2018  . GERD without esophagitis 12/06/2018  . Post-menopausal osteoporosis 12/06/2018  . Acute delirium 12/06/2018  . Hypomagnesemia 12/06/2018  . Acute blood loss anemia 12/06/2018  . Closed fracture of right femur, unspecified fracture morphology, initial encounter ([place]) 11/28/2018  . Acute diastolic CHF (congestive heart failure) ([place]) 11/28/2018  . Fracture, intertrochanteric, right femur ([place]) 11/28/2018  . Atrial fibrillation with rapid ventricular response ([place]) 11/25/2018  . Atrial fibrillation with RVR ([place]) 11/24/2018  . Chronic back  pain 11/24/2018  . Anxiety and depression 11/24/2018  . Chronic constipation 05/03/2016  . Dysphagia 05/03/2016  . Cholecystitis with cholelithiasis 01/29/2014    Past Surgical History:  Procedure Laterality Date  . ABDOMINAL HYSTERECTOMY    . BACK SURGERY    . CHOLECYSTECTOMY N/A 01/29/2014   Procedure: LAPAROSCOPIC CHOLECYSTECTOMY;  Surgeon: Scherry Ran,  MD;  Location: [facility] ORS;  Service: General;  Laterality: N/A;  . COLONOSCOPY N/A 10/27/2015   Procedure: COLONOSCOPY;  Surgeon: Rogene Houston, MD;  Location: [facility] ENDO SUITE;  Service: Endoscopy;  Laterality: N/A;  215  . INTRAMEDULLARY (IM) NAIL INTERTROCHANTERIC Right 11/29/2018   Procedure: INTRAMEDULLARY (IM) NAIL INTERTROCHANTRIC FRACTURE;  Surgeon: Renette Butters, MD;  Location: [place];  Service: Orthopedics;  Laterality: Right;  . TONSILLECTOMY    . YAG LASER APPLICATION Left [id number]/42/6834   Procedure: YAG LASER APPLICATION;  Surgeon: Elta Guadeloupe T. Gershon Crane, MD;  Location: [facility] ORS;  Service: Ophthalmology;  Laterality: Left;  left     OB History   No obstetric history on file.     Family History  Problem Relation Age of Onset  . Hypertension Child     Social History   Tobacco Use  . Smoking status: Never Smoker  . Smokeless tobacco: Never Used  Vaping Use  . Vaping Use: Never used  Substance Use Topics  . Alcohol use: No  . Drug use: No    Home Medications Prior to Admission medications   Medication Sig Start Date End Date Taking? Authorizing Provider  acetaminophen (TYLENOL) 325 MG tablet Take 2 tablets (650 mg total) by mouth every 4 (four) hours as needed for headache or mild pain. 12/23/19   Roxan Hockey, MD  apixaban (ELIQUIS) 2.5 MG TABS tablet Take 1 tablet (2.5 mg total) by mouth 2 (two) times daily. 12/23/19   Roxan Hockey, MD  calcium-vitamin D (OS-CAL 500 + D) 500-200 MG-UNIT tablet Take 1 tablet by mouth 2 (two) times daily. 12/05/18   Desiree Hane, MD  diltiazem (CARDIZEM CD) 180 MG 24 hr capsule Take 1 capsule (180 mg total) by mouth daily. 11/26/19 11/25/20  [name], MD  ferrous sulfate 325 (65 FE) MG EC tablet Take 1 tablet (325 mg total) by mouth daily with breakfast. 12/03/19 12/02/20  Minus Liberty, PA-C  HYDROcodone-acetaminophen (NORCO/VICODIN) 5-325 MG tablet Take 1 tablet by mouth every 4 (four) hours as needed. 12/26/19   [provider]  LORazepam (ATIVAN) 0.5 MG tablet Take 0.5 mg by mouth at bedtime as needed. 12/26/19   [provider]  magnesium oxide (MAG-OX) 400 MG tablet Take 1 tablet (400 mg total) by mouth 3 (three) times daily. 12/23/18   Gerlene Fee, NP  memantine (NAMENDA) 5 MG tablet Take 1 tablet (5 mg total) by mouth 2 (two) times daily. 11/26/19   Corum, Rex Kras, MD  metoprolol tartrate (LOPRESSOR) 25 MG tablet Take 1 tablet (25 mg total) by mouth 2 (two) times daily. 12/23/19   Roxan Hockey, MD  pantoprazole (PROTONIX) 40 MG tablet TAKE (1) TABLET BY MOUTH DAILY. 11/26/19   Corum, Rex Kras, MD  Polyethyl Glycol-Propyl Glycol (SYSTANE OP) Place 1 drop into both eyes daily as needed. Dry Eyes  Patient not taking: Reported on 01/09/2020    [provider]  Potassium Chloride ER 20 MEQ TBCR Take 20 mEq by mouth daily. 01/27/20 04/26/20  Satira Sark, MD  QUEtiapine (SEROQUEL) 25 MG tablet Take 1 tablet (25 mg total) by mouth  at bedtime. 12/23/18   Gerlene Fee, NP  sertraline (ZOLOFT) 50 MG tablet Take 1 tablet (50 mg total) by mouth every morning. 12/23/18   Gerlene Fee, NP  torsemide (DEMADEX) 20 MG tablet Take 1 tablet (20 mg total) by mouth 2 (two) times daily. 12/23/19   Roxan Hockey, MD    Allergies    Penicillins, Biaxin [clarithromycin], Ciprofloxacin, Emetrol, Feldene [piroxicam], and Sulfa antibiotics  Review of Systems   Review of Systems  Unable to perform ROS: Mental status change  Cardiovascular: Negative for chest pain.    Physical Exam Updated Vital Signs BP (!) 85/57   Pulse 87   Temp (!) 97.5 F (36.4 C) (Oral)   Resp 16   Wt 55.8 kg   SpO2 96%   BMI 20.79 kg/m   Physical Exam Vitals and nursing note reviewed.  Constitutional:      Appearance: She is well-developed.     Comments: lethargic  HENT:     Head: Normocephalic.     Nose: Nose normal.  Eyes:     General: No scleral icterus.    Conjunctiva/sclera: Conjunctivae normal.  Neck:      Thyroid: No thyromegaly.  Cardiovascular:     Rate and Rhythm: Normal rate and regular rhythm.     Heart sounds: No murmur heard.  No friction rub. No gallop.   Pulmonary:     Breath sounds: No stridor. No wheezing or rales.  Chest:     Chest wall: No tenderness.  Abdominal:     General: There is no distension.     Tenderness: There is no abdominal tenderness. There is no rebound.  Musculoskeletal:        General: Normal range of motion.     Cervical back: Neck supple.  Lymphadenopathy:     Cervical: No cervical adenopathy.  Skin:    Findings: No erythema or rash.  Neurological:     Motor: No abnormal muscle tone.     Coordination: Coordination normal.     Comments: Oriented to person  Psychiatric:        Behavior: Behavior normal.     ED Results / Procedures / Treatments   Labs (all labs ordered are listed, but only abnormal results are displayed) Labs Reviewed  LACTIC ACID, PLASMA - Abnormal; Notable for the following components:      Result Value   Lactic Acid, Venous 5.4 (*)    All other components within normal limits  COMPREHENSIVE METABOLIC PANEL - Abnormal; Notable for the following components:   Sodium 134 (*)    Potassium 5.2 (*)    Chloride 93 (*)    Glucose, Bld 129 (*)    BUN 52 (*)    Creatinine, Ser 3.23 (*)    AST 148 (*)    ALT 81 (*)    Total Bilirubin 1.7 (*)    GFR calc non Af Amer 12 (*)    GFR calc Af Amer 14 (*)    Anion gap 17 (*)    All other components within normal limits  CBC WITH DIFFERENTIAL/PLATELET - Abnormal; Notable for the following components:   WBC 16.8 (*)    RBC 3.78 (*)    Hemoglobin 10.3 (*)    HCT 34.2 (*)    Neutro Abs 14.9 (*)    Abs Immature Granulocytes 0.27 (*)    All other components within normal limits  PROTIME-INR - Abnormal; Notable for the following components:   Prothrombin Time 26.5 (*)  INR 2.5 (*)    All other components within normal limits  APTT - Abnormal; Notable for the following components:     aPTT 45 (*)    All other components within normal limits  BRAIN NATRIURETIC PEPTIDE - Abnormal; Notable for the following components:   B Natriuretic Peptide 2,156.0 (*)    All other components within normal limits  TROPONIN I (HIGH SENSITIVITY) - Abnormal; Notable for the following components:   Troponin I (High Sensitivity) 28 (*)    All other components within normal limits  CULTURE, BLOOD (ROUTINE X 2)  CULTURE, BLOOD (ROUTINE X 2)  URINE CULTURE  RESPIRATORY PANEL BY RT PCR (FLU A&B, COVID)  CK  ETHANOL  AMMONIA  LACTIC ACID, PLASMA  URINALYSIS, ROUTINE W REFLEX MICROSCOPIC  TROPONIN I (HIGH SENSITIVITY)    EKG EKG Interpretation  Date/Time:  12-May-2020 11:09:18 EDT Ventricular Rate:  76 PR Interval:    QRS Duration: 99 QT Interval:  420 QTC Calculation: 473 R Axis:   -52 Text Interpretation: Atrial fibrillation Left anterior fascicular block Anterior infarct, old Minimal ST depression, lateral leads Confirmed by Milton Ferguson [phone]) on 2020-05-12 12:57:15 PM   Radiology DG Chest Port 1 View  Result Date: May 12, 2020 CLINICAL DATA:  Sepsis EXAM: PORTABLE CHEST 1 VIEW COMPARISON:  12/15/2019 FINDINGS: Grossly stable cardiomediastinal contours. Atherosclerotic calcification of the aortic knob. Chronic small to moderate right-sided pleural effusion. Suspect small left-sided pleural effusion. Patchy opacities within the mid to lower lung fields bilaterally. No pneumothorax. IMPRESSION: 1. Chronic small to moderate right-sided pleural effusion. 2. Suspect small left-sided pleural effusion. 3. Patchy opacities within the mid to lower lung fields bilaterally, atelectasis versus pneumonia. Electronically Signed   By: Davina Poke D.O.   On: May 12, 2020 12:15    Procedures Procedures (including critical care time)  Medications Ordered in ED Medications  vancomycin (VANCOCIN) IVPB 1000 mg/200 mL premix (1,000 mg Intravenous New Bag/Given 05/12/20 1243)   metroNIDAZOLE (FLAGYL) IVPB 500 mg (0 mg Intravenous Stopped 2020/05/12 1320)  sodium chloride 0.9 % bolus 1,000 mL (0 mLs Intravenous Stopped 05-12-2020 1242)  ceFEPIme (MAXIPIME) 2 g in sodium chloride 0.9 % 100 mL IVPB (0 g Intravenous Stopped 12-May-2020 1238)    ED Course  I have reviewed the triage vital signs and the nursing notes.  Pertinent labs & imaging results that were available during my care of the patient were reviewed by me and considered in my medical decision making (see chart for details). CRITICAL CARE Performed by: Milton Ferguson Total critical care time: 45 minutes Critical care time was exclusive of separately billable procedures and treating other patients. Critical care was necessary to treat or prevent imminent or life-threatening deterioration. Critical care was time spent personally by me on the following activities: development of treatment plan with patient and/or surrogate as well as nursing, discussions with consultants, evaluation of patient's response to treatment, examination of patient, obtaining history from patient or surrogate, ordering and performing treatments and interventions, ordering and review of laboratory studies, ordering and review of radiographic studies, pulse oximetry and re-evaluation of patient's condition.    MDM Rules/Calculators/A&P        pt with chf and pneumonia.  Pt will be admitted to medicine          This patient presents to the ED for concern of weakness this involves an extensive number of treatment options, and is a complaint that carries with it a high risk of complications and morbidity.  The differential  diagnosis includes sepsis stroke   Lab Tests:   I Ordered, reviewed, and interpreted labs, which included CBC and chemistries which showed elevated white count anemia and worsening kidney function  Medicines ordered:   I ordered medication antibiotics for sepsis  Imaging Studies ordered:   I ordered imaging  studies which included chest x-ray  I independently visualized and interpreted imaging which showed pneumonia  Additional history obtained:   Additional history obtained from daughter  Previous records obtained and reviewed.  Consultations Obtained:   I consulted hospital and discussed lab and imaging findings  Reevaluation:  After the interventions stated above, I reevaluated the patient and found unchanged  Critical Interventions:  .                 Final Clinical Impression(s) / ED Diagnoses Final diagnoses:  Systolic congestive heart failure, unspecified HF chronicity ([place])    Rx / DC Orders ED Discharge Orders    None       Milton Ferguson, MD 04/14/20 [phone]

## 2020-05-10 NOTE — H&P (Signed)
History and Physical    Kristy Mckinney TIR:[id number] DOB: Sep 12, 1931 DOA: 04-28-2020  PCP: Celene Squibb, MD  Patient coming from: Home  I have personally briefly reviewed patient's old medical records in [place]  Chief Complaint: Patient found down on the ground  HPI: Kristy Mckinney is a 86 y.o. female with medical history significant of atrial fibrillation, chronic diastolic congestive heart failure, dementia, chronic kidney disease stage IIIb, is being followed by hospice at home.  Patient was found down at home by family.  It was unclear to how long she was on the ground.  She was lethargic.  Prior to this event, patient had reportedly been doing well over the past few days.  History is somewhat limited since patient is unable to participate because of mental status.  Family did not note any recent changes in her condition until he found her down today.  ED Course: She was evaluated in the emergency room where she was noted to have significant acute kidney injury, lactic acidosis, elevated WBC.  Chest x-ray indicated possible pneumonia.  She was noted to be borderline hypotensive and received IV fluids.  She has been referred for admission.  Review of Systems: Unable to assess due to mental status   Past Medical History:  Diagnosis Date  . Anxiety   . Atrial fibrillation ([place])   . Chronic back pain   . Chronic diastolic heart failure ([place])   . Dementia ([place])   . Disorder of left temporomandibular joint   . Essential hypertension   . GERD (gastroesophageal reflux disease)   . History of bronchitis   . Iron deficiency anemia   . Irritable bowel syndrome with diarrhea   . Mitral stenosis   . Osteoporosis   . Pneumonia   . Pulmonary hypertension ([place])     Past Surgical History:  Procedure Laterality Date  . ABDOMINAL HYSTERECTOMY    . BACK SURGERY    . CHOLECYSTECTOMY N/A 01/29/2014   Procedure: LAPAROSCOPIC CHOLECYSTECTOMY;  Surgeon: Scherry Ran, MD;   Location: [facility];  Service: General;  Laterality: N/A;  . COLONOSCOPY N/A 10/27/2015   Procedure: COLONOSCOPY;  Surgeon: Rogene Houston, MD;  Location: [facility];  Service: Endoscopy;  Laterality: N/A;  215  . INTRAMEDULLARY (IM) NAIL INTERTROCHANTERIC Right 11/29/2018   Procedure: INTRAMEDULLARY (IM) NAIL INTERTROCHANTRIC FRACTURE;  Surgeon: Renette Butters, MD;  Location: [facility];  Service: Orthopedics;  Laterality: Right;  . TONSILLECTOMY    . YAG LASER APPLICATION Left [id number]/9509   Procedure: YAG LASER APPLICATION;  Surgeon: Elta Guadeloupe T. Gershon Crane, MD;  Location: [facility];  Service: Ophthalmology;  Laterality: Left;  left    Social History:  reports that she has never smoked. She has never used smokeless tobacco. She reports that she does not drink alcohol and does not use drugs.  Allergies  Allergen Reactions  . Penicillins Anaphylaxis    Did it involve swelling of the face/tongue/throat, SOB, or low BP? Unknown Did it involve sudden or severe rash/hives, skin peeling, or any reaction on the inside of your mouth or nose? Unknown Did you need to seek medical attention at a hospital or doctor's office? Unknown When did it last happen? If all above answers are "NO", may proceed with cephalosporin use.   . Biaxin [Clarithromycin]   . Ciprofloxacin Nausea And Vomiting  . Emetrol Nausea Only    swelling  . Feldene [Piroxicam] Nausea And Vomiting  . Sulfa Antibiotics     swelling  Family History  Problem Relation Age of Onset  . Hypertension Child      Prior to Admission medications   Medication Sig Start Date End Date Taking? Authorizing Provider  apixaban (ELIQUIS) 2.5 MG TABS tablet Take 1 tablet (2.5 mg total) by mouth 2 (two) times daily. 12/23/19  Yes Emokpae, Courage, MD  diltiazem (CARDIZEM CD) 180 MG 24 hr capsule Take 1 capsule (180 mg total) by mouth daily. 11/26/19 11/25/20 Yes Corum, Rex Kras, MD  HYDROcodone-acetaminophen (NORCO/VICODIN) 5-325 MG tablet Take 1 tablet  by mouth every 4 (four) hours as needed for moderate pain.  12/26/19  Yes [provider]  LORazepam (ATIVAN) 0.5 MG tablet Take 0.5 mg by mouth at bedtime as needed for anxiety or sleep.  12/26/19  Yes [provider]  magnesium oxide (MAG-OX) 400 MG tablet Take 1 tablet (400 mg total) by mouth 3 (three) times daily. Patient taking differently: Take 500 mg by mouth in the morning and at bedtime.  12/23/18  Yes Gerlene Fee, NP  memantine (NAMENDA) 5 MG tablet Take 1 tablet (5 mg total) by mouth 2 (two) times daily. 11/26/19  Yes Corum, Rex Kras, MD  metoprolol tartrate (LOPRESSOR) 25 MG tablet Take 1 tablet (25 mg total) by mouth 2 (two) times daily. Patient taking differently: Take 12.5 mg by mouth daily.  12/23/19  Yes Emokpae, Courage, MD  ondansetron (ZOFRAN-ODT) 8 MG disintegrating tablet Take 8 mg by mouth every 8 (eight) hours as needed for nausea or vomiting.   Yes [provider]  pantoprazole (PROTONIX) 40 MG tablet TAKE (1) TABLET BY MOUTH DAILY. Patient taking differently: Take 40 mg by mouth daily.  11/26/19  Yes Corum, Rex Kras, MD  Potassium Chloride ER 20 MEQ TBCR Take 20 mEq by mouth daily. 01/27/20 04/26/20 Yes Satira Sark, MD  sertraline (ZOLOFT) 50 MG tablet Take 1 tablet (50 mg total) by mouth every morning. 12/23/18  Yes Gerlene Fee, NP  torsemide (DEMADEX) 20 MG tablet Take 1 tablet (20 mg total) by mouth 2 (two) times daily. 12/23/19  Yes Roxan Hockey, MD  acetaminophen (TYLENOL) 325 MG tablet Take 2 tablets (650 mg total) by mouth every 4 (four) hours as needed for headache or mild pain. 12/23/19   Roxan Hockey, MD    Physical Exam: Vitals:   04/14/2020 1430 April 14, 2020 1500 04-14-2020 1530 04/14/2020 1716  BP: (!) 90/56 97/72 108/70   Mckinney: 83 94 95   Resp: 17 (!) 28 (!) 23   Temp:      TempSrc:      SpO2: 99% 96% 94%   Weight:    54.8 kg  Height:    5\' 5"  (1.651 m)    Constitutional: Somnolent, no distress Eyes: PERRL, lids and  conjunctivae normal ENMT: Mucous membranes are dry. Posterior pharynx clear of any exudate or lesions.Normal dentition.  Neck: normal, supple, no masses, no thyromegaly Respiratory: clear to auscultation bilaterally, no wheezing, no crackles. Normal respiratory effort. No accessory muscle use.  Cardiovascular: Regular rate and rhythm, no murmurs / rubs / gallops.  Trace extremity edema. 2+ pedal pulses. No carotid bruits.  Abdomen: no tenderness, no masses palpated. No hepatosplenomegaly. Bowel sounds positive.  Musculoskeletal: no clubbing / cyanosis. No joint deformity upper and lower extremities. Good ROM, no contractures. Normal muscle tone.  Skin: no rashes, lesions, ulcers. No induration Neurologic: Limited exam due to mental status.  No gross deficits or asymmetry noted. Psychiatric: Somnolent, confused   Labs on Admission: I  have personally reviewed following labs and imaging studies  CBC: Recent Labs  Lab 04-27-2020 1148  WBC 16.8*  NEUTROABS 14.9*  HGB 10.3*  HCT 34.2*  MCV 90.5  PLT 024   Basic Metabolic Panel: Recent Labs  Lab 04-27-2020 1148  NA 134*  K 5.2*  CL 93*  CO2 24  GLUCOSE 129*  BUN 52*  CREATININE 3.23*  CALCIUM 9.7   GFR: Estimated Creatinine Clearance: 11 mL/min (A) (by C-G formula based on SCr of 3.23 mg/dL (H)). Liver Function Tests: Recent Labs  Lab 2020-04-27 1148  AST 148*  ALT 81*  ALKPHOS 121  BILITOT 1.7*  PROT 6.5  ALBUMIN 3.5   No results for input(s): LIPASE, AMYLASE in the last 168 hours. Recent Labs  Lab April 27, 2020 1148  AMMONIA 23   Coagulation Profile: Recent Labs  Lab 04/27/2020 1148  INR 2.5*   Cardiac Enzymes: Recent Labs  Lab April 27, 2020 1148  CKTOTAL 234   BNP (last 3 results) No results for input(s): PROBNP in the last 8760 hours. HbA1C: No results for input(s): HGBA1C in the last 72 hours. CBG: No results for input(s): GLUCAP in the last 168 hours. Lipid Profile: No results for input(s): CHOL, HDL, LDLCALC,  TRIG, CHOLHDL, LDLDIRECT in the last 72 hours. Thyroid Function Tests: No results for input(s): TSH, T4TOTAL, FREET4, T3FREE, THYROIDAB in the last 72 hours. Anemia Panel: No results for input(s): VITAMINB12, FOLATE, FERRITIN, TIBC, IRON, RETICCTPCT in the last 72 hours. Urine analysis:    Component Value Date/Time   COLORURINE YELLOW 09/10/2019 1248   APPEARANCEUR CLEAR 09/10/2019 1248   LABSPEC 1.018 09/10/2019 1248   PHURINE 6.0 09/10/2019 1248   GLUCOSEU NEGATIVE 09/10/2019 1248   HGBUR NEGATIVE 09/10/2019 [place] 09/10/2019 [place] 09/10/2019 1248   PROTEINUR 100 (A) 09/10/2019 1248   NITRITE NEGATIVE 09/10/2019 [place] 09/10/2019 1248    Radiological Exams on Admission: DG Chest Port 1 View  Result Date: 2020-04-27 CLINICAL DATA:  Sepsis EXAM: PORTABLE CHEST 1 VIEW COMPARISON:  12/15/2019 FINDINGS: Grossly stable cardiomediastinal contours. Atherosclerotic calcification of the aortic knob. Chronic small to moderate right-sided pleural effusion. Suspect small left-sided pleural effusion. Patchy opacities within the mid to lower lung fields bilaterally. No pneumothorax. IMPRESSION: 1. Chronic small to moderate right-sided pleural effusion. 2. Suspect small left-sided pleural effusion. 3. Patchy opacities within the mid to lower lung fields bilaterally, atelectasis versus pneumonia. Electronically Signed   By: Davina Poke D.O.   On: 04/27/2020 12:15    Assessment/Plan Active Problems:   Dementia (HCC)   Pleural effusion   Acute on chronic diastolic CHF (congestive heart failure) ([place])   Essential (primary) hypertension   DNR (do not resuscitate)   AKI (acute kidney injury) ([place])   Permanent atrial fibrillation (HCC)   Lactic acidosis   Dehydration     86 year old female with multiple medical problems including chronic diastolic congestive heart failure, chronic kidney disease stage IIIb, atrial fibrillation,  who is being followed by hospice for advanced CHF.  She is brought to the hospital today after being found down.  She has elevated lactic acidosis, acute kidney injury.  Chest x-ray indicates possible pneumonia.  She received antibiotics.  Sepsis was ruled out in the emergency room since she was afebrile, did not have any tachycardia and did not have any respiratory distress.  I suspect that her leukocytosis was related to dehydration and hemoconcentration.  After discussing goals of care with patient's daughter  as well as hospice providers, it was determined that patient be admitted to the hospital for end-of-life care.  Unfortunately, there are no beds available at residential hospice.  She will be admitted for comfort measures.  We will not continue any further IV fluids or antibiotics.  Anticipate in-hospital death.  Can consider transition to residential hospice once room is available and if patient has stabilized.  DVT prophylaxis: None, comfort care Code Status: DNR, present on admission Family Communication: Discussed with patient's daughter, Kristy Mckinney Disposition Plan: Awaiting bed at residential hospice Consults called:   Admission status: Inpatient, MedSurg  Kathie Dike MD [facility]   If 7PM-7AM, please contact night-coverage [email or web address]   May 03, 2020, 7:57 PM

## 2020-05-10 NOTE — Death Summary Note (Signed)
DEATH SUMMARY   Patient Details  Name: Kristy Mckinney MRN: [id number] DOB: [birth year]  Admission/Discharge Information   Admit Date:  2020/04/18  Date of Death: Date of Death: Apr 18, 2020  Time of Death: Time of Death: 10-05-56  Length of Stay: 1  Referring Physician: Celene Squibb, MD   Reason(s) for Hospitalization  Patient found down on the ground with altered mental status  Diagnoses  Preliminary cause of death: Acute kidney injury with lactic acidosis Secondary Diagnoses (including complications and co-morbidities):  Active Problems:   Dementia (HCC)   Pleural effusion   Acute on chronic diastolic CHF (congestive heart failure) ([place])   Essential (primary) hypertension   DNR (do not resuscitate)   AKI (acute kidney injury) ([place])   Permanent atrial fibrillation ([place])   Lactic acidosis   Dehydration   Brief Hospital Course (including significant findings, care, treatment, and services provided and events leading to death)  Kristy Mckinney is a 86 y.o. year old female with multiple medical problems including chronic diastolic congestive heart failure, chronic kidney disease stage IIIb, atrial fibrillation, who is being followed by hospice for advanced CHF.  She is brought to the hospital today after being found down.  She has elevated lactic acidosis, acute kidney injury.  Chest x-ray indicates possible pneumonia.  She received antibiotics.  Sepsis was ruled out in the emergency room since she was afebrile, did not have any tachycardia and did not have any respiratory distress.  I suspect that her leukocytosis was related to dehydration and hemoconcentration.  After discussing goals of care with patient's daughter as well as hospice providers, it was determined that patient be admitted to the hospital for end-of-life care.  She was admitted to the hospital for comfort measures.  Shortly after admission, patient passed away as had been expected.  Family was informed.    Pertinent  Labs and Studies  Significant Diagnostic Studies DG Chest Port 1 View  Result Date: 04/18/20 CLINICAL DATA:  Sepsis EXAM: PORTABLE CHEST 1 VIEW COMPARISON:  12/15/2019 FINDINGS: Grossly stable cardiomediastinal contours. Atherosclerotic calcification of the aortic knob. Chronic small to moderate right-sided pleural effusion. Suspect small left-sided pleural effusion. Patchy opacities within the mid to lower lung fields bilaterally. No pneumothorax. IMPRESSION: 1. Chronic small to moderate right-sided pleural effusion. 2. Suspect small left-sided pleural effusion. 3. Patchy opacities within the mid to lower lung fields bilaterally, atelectasis versus pneumonia. Electronically Signed   By: Davina Poke D.O.   On: 2020-04-18 12:15    Microbiology Recent Results (from the past 240 hour(s))  Respiratory Panel by RT PCR (Flu A&B, Covid) - Nasopharyngeal Swab     Status: None   Collection Time: 2020-04-18 11:50 AM   Specimen: Nasopharyngeal Swab  Result Value Ref Range Status   SARS Coronavirus 2 by RT PCR NEGATIVE NEGATIVE Final    Comment: (NOTE) SARS-CoV-2 target nucleic acids are NOT DETECTED.  The SARS-CoV-2 RNA is generally detectable in upper respiratoy specimens during the acute phase of infection. The lowest concentration of SARS-CoV-2 viral copies this assay can detect is 131 copies/mL. A negative result does not preclude SARS-Cov-2 infection and should not be used as the sole basis for treatment or other patient management decisions. A negative result may occur with  improper specimen collection/handling, submission of specimen other than nasopharyngeal swab, presence of viral mutation(s) within the areas targeted by this assay, and inadequate number of viral copies (<131 copies/mL). A negative result must be combined with clinical observations, patient history, and epidemiological  information. The expected result is Negative.  Fact Sheet for Patients:   PinkCheek.be  Fact Sheet for Healthcare Providers:  GravelBags.it  This test is no t yet approved or cleared by the [place] FDA and  has been authorized for detection and/or diagnosis of SARS-CoV-2 by FDA under an Emergency Use Authorization (EUA). This EUA will remain  in effect (meaning this test can be used) for the duration of the COVID-19 declaration under Section 564(b)(1) of the Act, 21 U.S.C. section 360bbb-3(b)(1), unless the authorization is terminated or revoked sooner.     Influenza A by PCR NEGATIVE NEGATIVE Final   Influenza B by PCR NEGATIVE NEGATIVE Final    Comment: (NOTE) The Xpert Xpress SARS-CoV-2/FLU/RSV assay is intended as an aid in  the diagnosis of influenza from Nasopharyngeal swab specimens and  should not be used as a sole basis for treatment. Nasal washings and  aspirates are unacceptable for Xpert Xpress SARS-CoV-2/FLU/RSV  testing.  Fact Sheet for Patients: PinkCheek.be  Fact Sheet for Healthcare Providers: GravelBags.it  This test is not yet approved or cleared by the [place] FDA and  has been authorized for detection and/or diagnosis of SARS-CoV-2 by  FDA under an Emergency Use Authorization (EUA). This EUA will remain  in effect (meaning this test can be used) for the duration of the  Covid-19 declaration under Section 564(b)(1) of the Act, 21  U.S.C. section 360bbb-3(b)(1), unless the authorization is  terminated or revoked. Performed at [facility], [address], [place], [place] [address]   Blood Culture (routine x 2)     Status: None (Preliminary result)   Collection Time: 05/12/20 12:05 PM   Specimen: BLOOD RIGHT HAND  Result Value Ref Range Status   Specimen Description BLOOD RIGHT HAND  Final   Special Requests   Final    BOTTLES DRAWN AEROBIC AND ANAEROBIC Blood Culture results may not be optimal due to  an inadequate volume of blood received in culture bottles   Culture   Final    NO GROWTH < 24 HOURS Performed at [facility], [address] [place], [place] [address]    Report Status PENDING  Incomplete  Blood Culture (routine x 2)     Status: None (Preliminary result)   Collection Time: 2020/05/12 12:05 PM   Specimen: BLOOD  Result Value Ref Range Status   Specimen Description BLOOD LEFT ANTECUBITAL  Final   Special Requests   Final    BOTTLES DRAWN AEROBIC AND ANAEROBIC Blood Culture adequate volume   Culture  Setup Time IN BOTH AEROBIC AND ANAEROBIC BOTTLES  Final   Culture   Final    NO GROWTH < 24 HOURS Performed at [facility], [address], [place], [place] [address]    Report Status PENDING  Incomplete    Lab Basic Metabolic Panel: Recent Labs  Lab 05-12-2020 1148  NA 134*  K 5.2*  CL 93*  CO2 24  GLUCOSE 129*  BUN 52*  CREATININE 3.23*  CALCIUM 9.7   Liver Function Tests: Recent Labs  Lab May 12, 2020 1148  AST 148*  ALT 81*  ALKPHOS 121  BILITOT 1.7*  PROT 6.5  ALBUMIN 3.5   No results for input(s): LIPASE, AMYLASE in the last 168 hours. Recent Labs  Lab 05-12-20 1148  AMMONIA 23   CBC: Recent Labs  Lab May 12, 2020 1148  WBC 16.8*  NEUTROABS 14.9*  HGB 10.3*  HCT 34.2*  MCV 90.5  PLT 177   Cardiac Enzymes: Recent Labs  Lab 16-Apr-2020 1148  CKTOTAL 234   Sepsis Labs: Recent Labs  Lab 2020-04-16 1148 16-Apr-2020 1315  WBC 16.8*  --   LATICACIDVEN 5.4* 3.5*    Procedures/Operations     [employer] 04/13/2020, 9:38 PM

## 2020-05-10 NOTE — ED Triage Notes (Signed)
Pt. From home. [place] last night family is not sure when they last spoke to pt around 9 pm. Family found pt. On the floor this morning with AMS. EMS called Hospice. Pt. Is in hospice for heart failure. EMS states pt. Was 42 O2 on RA. EMS placed pt. On 4 L of O2 pts. O2 was 100 after. Pt. States they are have lower back pain. Pt. Has chronic back pain but states it hurts more than usual. Pt. Denies hitting head during fall. No signs of abrasions or bumps on head during assessment.

## 2020-05-10 NOTE — Clinical Social Work Note (Signed)
Spoke with Colletta Maryland at [facility] hospice. Colletta Maryland to contact patient's Hospice nurse to discuss possibility of nurse coming to assess patient.     Gordon Carlson, Clydene Pugh, LCSW

## 2020-05-10 NOTE — ED Notes (Signed)
Report to Ashley, RN

## 2020-05-10 NOTE — Progress Notes (Signed)
A consult was received from an ED physician for Vancomycin and Cefepime per pharmacy dosing.  The patient's profile has been reviewed for ht/wt/allergies/indication/available labs.   A one time order has been placed for Vancomycin 1gm IV and cefepime 2gm IV .  Further antibiotics/pharmacy consults should be ordered by admitting physician if indicated.                       Thank you, Cristy Friedlander 04/18/20  4:56 PM

## 2020-05-10 NOTE — Sepsis Progress Note (Signed)
.  Notified provider of need to order additional fluid bolus.He will be evaluating her shortly.

## 2020-05-10 DEATH — deceased

## 2020-08-21 IMAGING — CT CT ABD-PELV W/O CM
2 of 4 series · 15 of 46 positions shown, 17 images · non-contrast
Comparison: No priors.

CLINICAL DATA: 86-year-old female with history of abdominal
distension and, nausea and vomiting.

EXAM:
CT ABDOMEN AND PELVIS WITHOUT CONTRAST
TECHNIQUE: Multidetector CT imaging of the abdomen and pelvis was performed
following the standard protocol without IV contrast.

[Series 2: axial st · axial · 0.92mm/px · z∈[+737,+1162]mm · 12 of 97 slices shown, 14 images]
[im 6/97  soft-tissue]
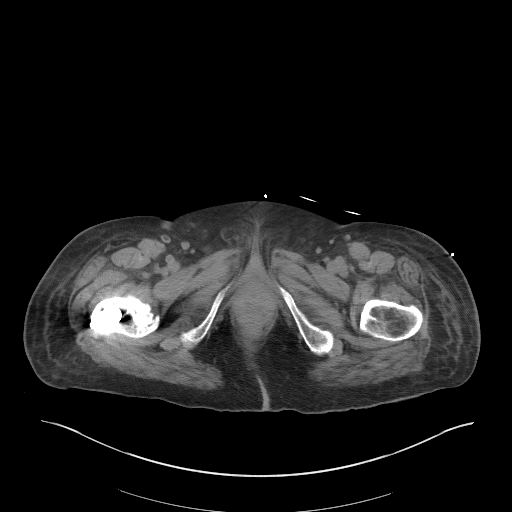
[im 6/97  bone]
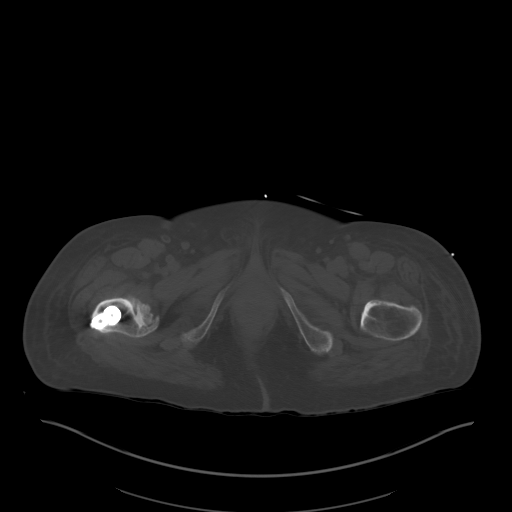
[im 16/97  soft-tissue]
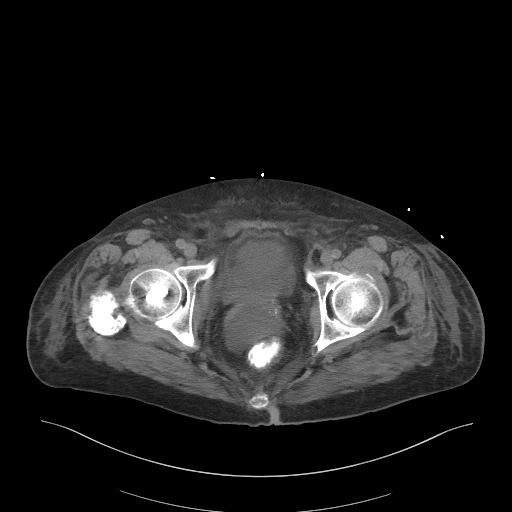
[im 21/97  soft-tissue]
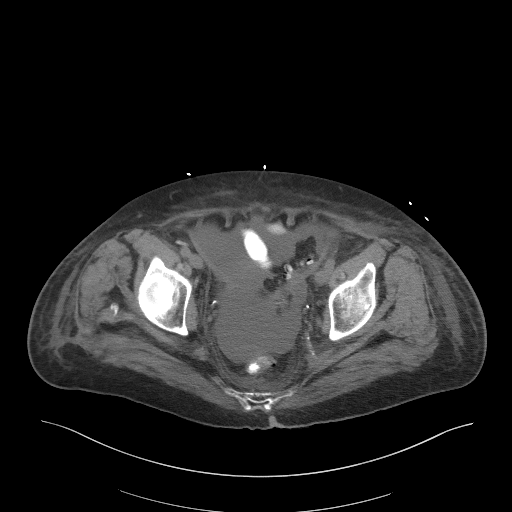
[im 31/97  soft-tissue]
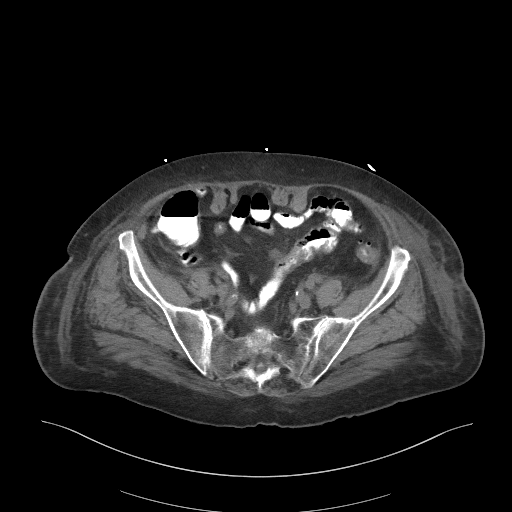
[im 36/97  soft-tissue]
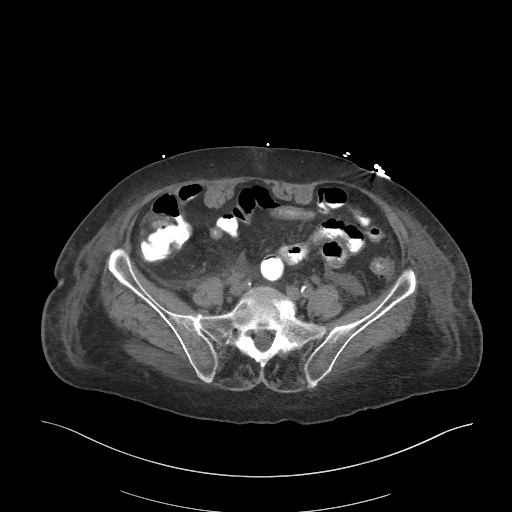
[im 46/97  soft-tissue]
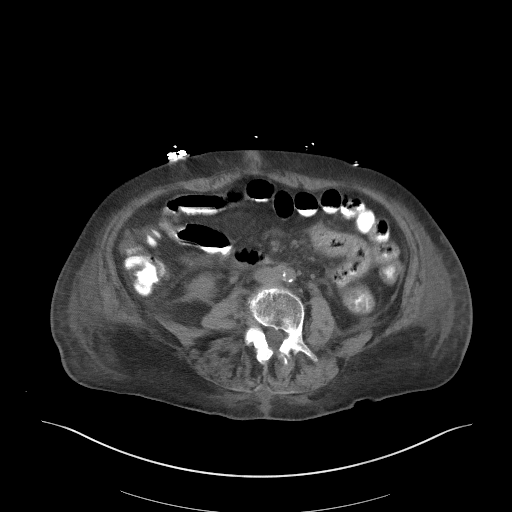
[im 51/97  soft-tissue]
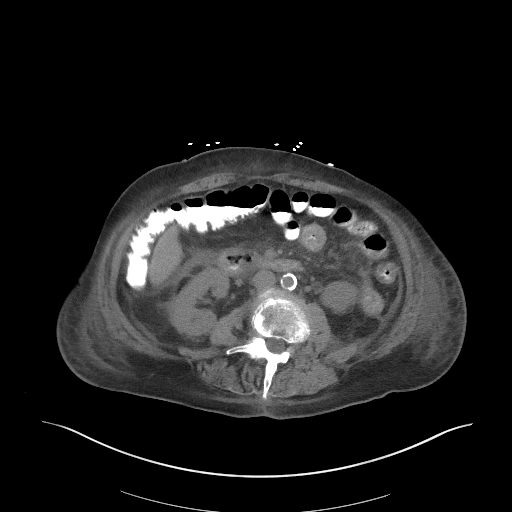
[im 61/97  soft-tissue]
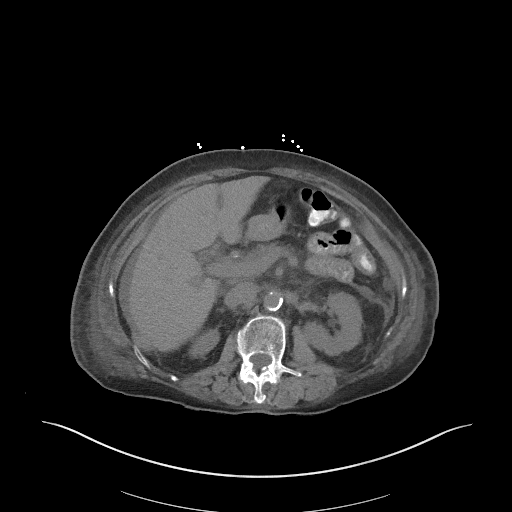
[im 66/97  soft-tissue]
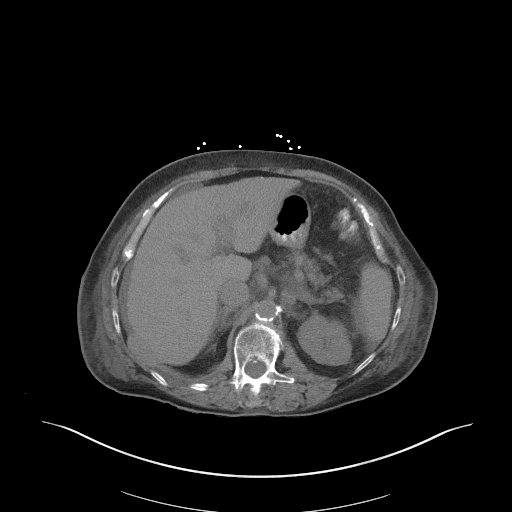
[im 66/97  bone]
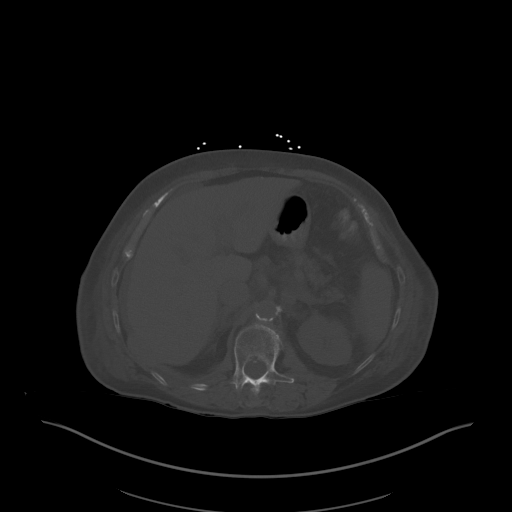
[im 76/97  soft-tissue]
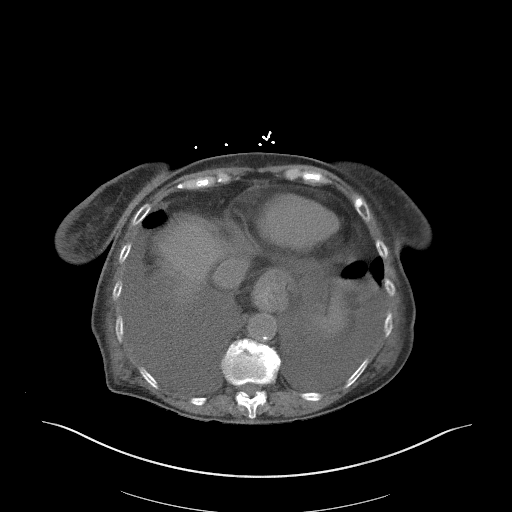
[im 81/97  soft-tissue]
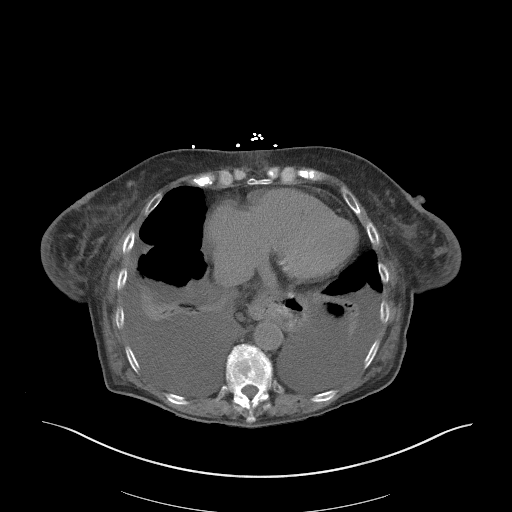
[im 91/97  soft-tissue]
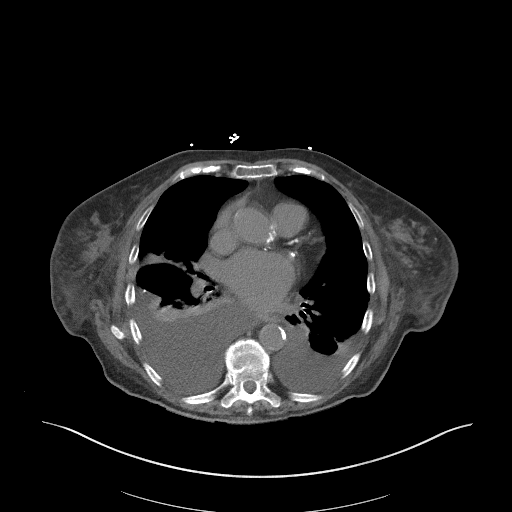

[Series 5: coronal st · coronal · 0.94mm/px · 3 of 113 slices shown]
[im 38/113  soft-tissue]
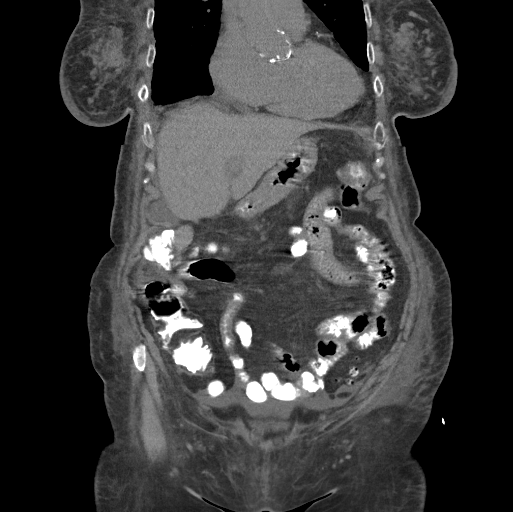
[im 50/113  soft-tissue]
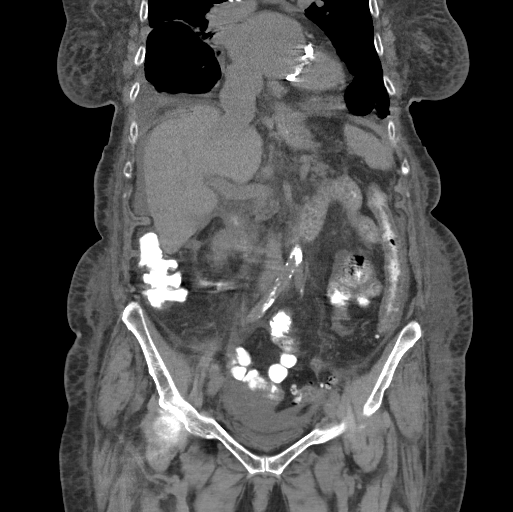
[im 63/113  soft-tissue]
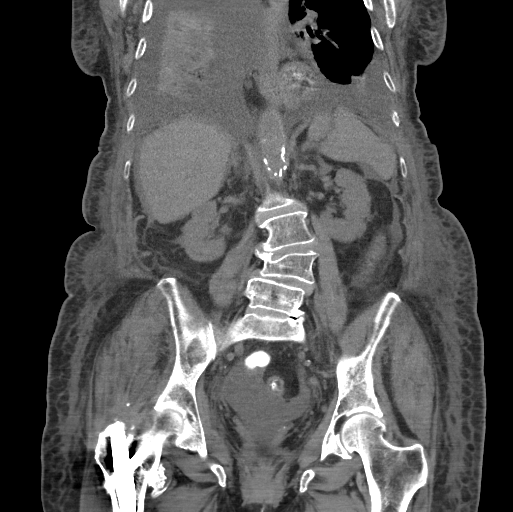

[15 of 46 positions shown; findings below may reference images not displayed]

FINDINGS: Lower chest: Large right and moderate left pleural effusions lying
dependently with extensive areas of passive atelectasis in the lung
bases bilaterally. Mild cardiomegaly. Atherosclerotic calcifications
in the descending thoracic aorta as well as the left main, left
anterior descending, left circumflex and right coronary arteries.
Severe calcifications of the aortic valve and mitral annulus.
Moderate to large hiatal hernia

Hepatobiliary: No definite suspicious cystic or solid hepatic
lesions are confidently identified on today's noncontrast CT
examination. Status post cholecystectomy.

Pancreas: No definite pancreatic mass. Mild peripancreatic
stranding.

Spleen: Trace amount of perisplenic fluid, otherwise unremarkable.

Adrenals/Urinary Tract: 2 mm nonobstructive calculus in the upper
pole collecting system of the right kidney and 3 mm nonobstructive
calculus in the lower pole collecting system of the left kidney.
Unenhanced appearance of the kidneys and bilateral adrenal glands is
otherwise unremarkable. No hydroureteronephrosis. Urinary bladder is
nearly decompressed, but otherwise unremarkable in appearance.

Stomach/Bowel: Normal appearance of the stomach. Mural thickening in
the duodenum, most evident in the second portion of the duodenum,
with surrounding inflammatory changes. No pathologic dilatation of
small bowel or colon. Numerous colonic diverticulae are noted,
particularly in the sigmoid colon. Appendix is not confidently
identified, likely surgically absent.

Vascular/Lymphatic: Aortic atherosclerosis. No lymphadenopathy noted
in the abdomen or pelvis.

Reproductive: Status post hysterectomy. Ovaries are not confidently
identified and may be surgically absent or atrophic.

Other: Small volume of ascites.  No pneumoperitoneum.

Musculoskeletal: Chronic appearing compression fracture of L4 with
70% loss of central vertebral body height. There are no aggressive
appearing lytic or blastic lesions noted in the visualized portions
of the skeleton. Postoperative changes of ORIF in the proximal right
femur.
IMPRESSION: 1. Inflammatory changes centered around the pancreas and duodenum,
concerning for acute pancreatitis and/or acute duodenitis.
2. Small volume of ascites.
3. Large right and moderate left pleural effusions lying dependently
with some associated passive atelectasis in the lung bases
bilaterally.
4. Tiny 2-3 mm nonobstructive calculi in the collecting systems of
both kidneys. No ureteral stones or findings of urinary tract
obstruction are noted at this time.
5. Mild cardiomegaly.
6. Aortic atherosclerosis, in addition to left main and 3 vessel
coronary artery disease.
7. There are calcifications of the aortic valve and mitral annulus.
Echocardiographic correlation for evaluation of potential valvular
dysfunction may be warranted if clinically indicated.
8. Moderate to large hiatal hernia.
9. Additional incidental findings, as above.

## 2020-08-21 IMAGING — CR DG CHEST 1V PORT
1 series · 1 of 1 positions shown · non-contrast
Comparison: 04/04/2019

CLINICAL DATA: Shortness of breath, abdominal pain

EXAM:
PORTABLE CHEST 1 VIEW

[portable]
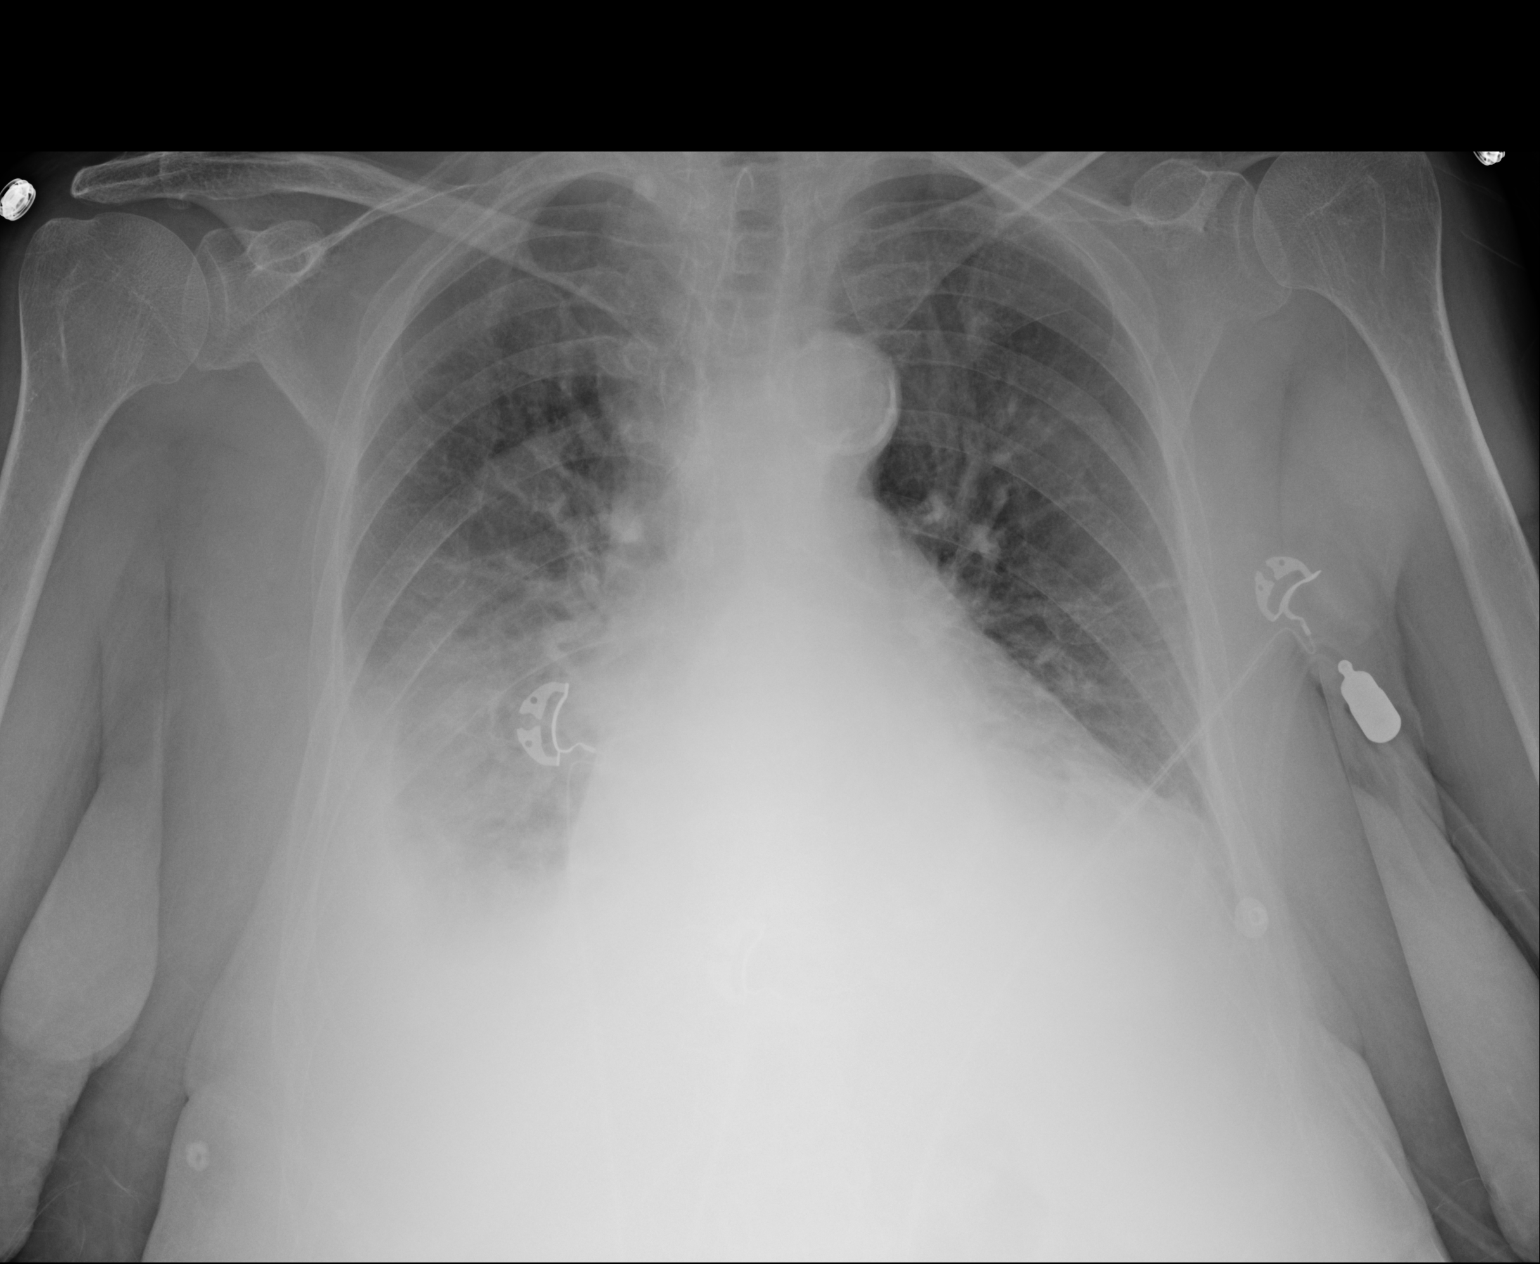

[1 of 1 positions shown; findings below may reference images not displayed]

FINDINGS: Slight interval increase in layering right pleural effusion and
associated atelectasis or consolidation. There may be an additional
small left pleural effusion. Mild diffuse interstitial pulmonary
opacity, likely edema, similar prior examination. No focal airspace
opacity. Cardiomegaly.
IMPRESSION: 1. Slight interval increase in layering right pleural effusion and
associated atelectasis or consolidation. There may be an additional
small left pleural effusion.

2. Mild diffuse interstitial pulmonary opacity, likely edema,
similar to prior examination. No focal airspace opacity.

3.  Cardiomegaly.
